# Patient Record
Sex: Male | Born: 1985 | Race: White | Hispanic: No | Marital: Single | Smoking: Never smoker
Health system: Southern US, Community
[De-identification: ages and names within clinical notes are randomized; demographics above are authoritative.]

## PROBLEM LIST (undated history)

## (undated) DIAGNOSIS — I499 Cardiac arrhythmia, unspecified: Secondary | ICD-10-CM

## (undated) DIAGNOSIS — F419 Anxiety disorder, unspecified: Secondary | ICD-10-CM

## (undated) DIAGNOSIS — K8020 Calculus of gallbladder without cholecystitis without obstruction: Secondary | ICD-10-CM

## (undated) DIAGNOSIS — F329 Major depressive disorder, single episode, unspecified: Secondary | ICD-10-CM

## (undated) DIAGNOSIS — Z789 Other specified health status: Secondary | ICD-10-CM

## (undated) DIAGNOSIS — K5900 Constipation, unspecified: Secondary | ICD-10-CM

## (undated) DIAGNOSIS — Z8709 Personal history of other diseases of the respiratory system: Secondary | ICD-10-CM

## (undated) DIAGNOSIS — F319 Bipolar disorder, unspecified: Secondary | ICD-10-CM

## (undated) DIAGNOSIS — K769 Liver disease, unspecified: Secondary | ICD-10-CM

## (undated) DIAGNOSIS — J982 Interstitial emphysema: Secondary | ICD-10-CM

## (undated) DIAGNOSIS — F410 Panic disorder [episodic paroxysmal anxiety] without agoraphobia: Secondary | ICD-10-CM

## (undated) DIAGNOSIS — F430 Acute stress reaction: Secondary | ICD-10-CM

## (undated) DIAGNOSIS — Q438 Other specified congenital malformations of intestine: Secondary | ICD-10-CM

## (undated) DIAGNOSIS — K219 Gastro-esophageal reflux disease without esophagitis: Secondary | ICD-10-CM

## (undated) DIAGNOSIS — F32A Depression, unspecified: Secondary | ICD-10-CM

## (undated) DIAGNOSIS — D649 Anemia, unspecified: Secondary | ICD-10-CM

## (undated) DIAGNOSIS — K5792 Diverticulitis of intestine, part unspecified, without perforation or abscess without bleeding: Secondary | ICD-10-CM

## (undated) HISTORY — DX: Diverticulitis of intestine, part unspecified, without perforation or abscess without bleeding: K57.92

## (undated) HISTORY — DX: Anxiety disorder, unspecified: F41.9

## (undated) HISTORY — PX: MOUTH SURGERY: SHX715

## (undated) HISTORY — PX: TONSILLECTOMY: SUR1361

## (undated) HISTORY — PX: CHOLECYSTECTOMY: SHX55

## (undated) HISTORY — PX: INCISE AND DRAIN ABCESS: PRO64

## (undated) HISTORY — DX: Major depressive disorder, single episode, unspecified: F32.9

## (undated) HISTORY — DX: Anemia, unspecified: D64.9

## (undated) HISTORY — DX: Calculus of gallbladder without cholecystitis without obstruction: K80.20

## (undated) HISTORY — DX: Depression, unspecified: F32.A

---
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
## 2009-07-07 HISTORY — PX: PR CHOLECYSTECTOMY: 47600

## 2013-05-20 ENCOUNTER — Encounter (HOSPITAL_COMMUNITY): Payer: Self-pay | Admitting: Emergency Medicine

## 2013-05-20 ENCOUNTER — Emergency Department (HOSPITAL_COMMUNITY)
Admission: EM | Admit: 2013-05-20 | Discharge: 2013-05-20 | Disposition: A | Discharge status: Home / Self Care | Payer: Self-pay | Attending: Emergency Medicine | Admitting: Emergency Medicine

## 2013-05-20 DIAGNOSIS — K002 Abnormalities of size and form of teeth: Secondary | ICD-10-CM | POA: Insufficient documentation

## 2013-05-20 DIAGNOSIS — H5710 Ocular pain, unspecified eye: Secondary | ICD-10-CM | POA: Insufficient documentation

## 2013-05-20 DIAGNOSIS — H9209 Otalgia, unspecified ear: Secondary | ICD-10-CM | POA: Insufficient documentation

## 2013-05-20 DIAGNOSIS — R079 Chest pain, unspecified: Secondary | ICD-10-CM | POA: Insufficient documentation

## 2013-05-20 DIAGNOSIS — K0889 Other specified disorders of teeth and supporting structures: Secondary | ICD-10-CM

## 2013-05-20 DIAGNOSIS — K089 Disorder of teeth and supporting structures, unspecified: Secondary | ICD-10-CM | POA: Insufficient documentation

## 2013-05-20 DIAGNOSIS — K029 Dental caries, unspecified: Secondary | ICD-10-CM | POA: Insufficient documentation

## 2013-05-20 DIAGNOSIS — R112 Nausea with vomiting, unspecified: Secondary | ICD-10-CM | POA: Insufficient documentation

## 2013-05-20 MED ORDER — ONDANSETRON 4 MG PO TBDP
4.0000 mg | ORAL_TABLET | Freq: Once | ORAL | Status: AC
  Administered 2013-05-20: 4 mg via ORAL
  Filled 2013-05-20: qty 1

## 2013-05-20 MED ORDER — PROMETHAZINE HCL 25 MG PO TABS: 25.0000 mg | ORAL_TABLET | Freq: Four times a day (QID) | ORAL | Status: DC | PRN

## 2013-05-20 MED ORDER — OXYCODONE-ACETAMINOPHEN 5-325 MG PO TABS
1.0000 | ORAL_TABLET | Freq: Once | ORAL | Status: AC
  Administered 2013-05-20: 1 via ORAL
  Filled 2013-05-20: qty 1

## 2013-05-20 MED ORDER — PENICILLIN V POTASSIUM 500 MG PO TABS
500.0000 mg | ORAL_TABLET | Freq: Once | ORAL | Status: AC
  Administered 2013-05-20: 500 mg via ORAL
  Filled 2013-05-20: qty 1

## 2013-05-20 MED ORDER — HYDROCODONE-ACETAMINOPHEN 5-325 MG PO TABS: 1.0000 | ORAL_TABLET | ORAL | Status: DC | PRN

## 2013-05-20 MED ORDER — PENICILLIN V POTASSIUM 500 MG PO TABS: 500.0000 mg | ORAL_TABLET | Freq: Three times a day (TID) | ORAL | Status: DC

## 2013-05-20 NOTE — ED Provider Notes (Signed)
CSN: [id number]     Arrival date & time 05/20/13  1245 History  This chart was scribed for Marlon Pel, PA-C, working with Audree Camel, MD, by [facility] ED Scribe. This patient was seen in room WTR6/WTR6 and the patient's care was started at 1:05 PM.    Chief Complaint  Patient presents with  . Jaw Pain  . Nausea  . Emesis    The history is provided by the patient. No language interpreter was used.    HPI Comments: Grayson Pfefferle is a 27 y.o. male who presents to the Emergency Department complaining of gradually worsening, constant, severe left-sided jaw pain over the past week. He describes this pain as "throbbing" and states that it radiates to his left ear and behind his left eye. He states that he has a long history of dental pain due to poor dentition. He reports associated nausea, emesis and chest pain onset 2 days ago. He denies fever. He states that he has no medication allergies.  History reviewed. No pertinent past medical history. Past Surgical History  Procedure Laterality Date  . Cholecystectomy     No family history on file. History  Substance Use Topics  . Smoking status: Never Smoker   . Smokeless tobacco: Never Used  . Alcohol Use: No    Review of Systems  HENT: Positive for dental problem (jaw pain) and ear pain (radiation of jaw pain to left ear).   Eyes: Positive for pain (radiation of jaw pain to behind left eye).  Cardiovascular: Positive for chest pain.  Gastrointestinal: Positive for nausea and vomiting.  All other systems reviewed and are negative.   Allergies  Review of patient's allergies indicates no known allergies.  Home Medications   Current Outpatient Rx  Name  Route  Sig  Dispense  Refill  . ibuprofen (ADVIL,MOTRIN) 200 MG tablet   Oral   Take 400-600 mg by mouth every 6 (six) hours as needed.         . naproxen sodium (ANAPROX) 220 MG tablet   Oral   Take 220-660 mg by mouth as needed (jaw pain).         Marland Kitchen  HYDROcodone-acetaminophen (NORCO/VICODIN) 5-325 MG per tablet   Oral   Take 1-2 tablets by mouth every 4 (four) hours as needed.   20 tablet   0   . penicillin v potassium (VEETID) 500 MG tablet   Oral   Take 1 tablet (500 mg total) by mouth 3 (three) times daily.   30 tablet   0   . promethazine (PHENERGAN) 25 MG tablet   Oral   Take 1 tablet (25 mg total) by mouth every 6 (six) hours as needed for nausea or vomiting.   30 tablet   0    There were no vitals taken for this visit.  Physical Exam  Nursing note and vitals reviewed. Constitutional: He is oriented to person, place, and time. He appears well-developed and well-nourished. No distress.  HENT:  Head: Normocephalic and atraumatic.  Mouth/Throat: Dental caries present.    Eyes: Conjunctivae and EOM are normal. Pupils are equal, round, and reactive to light.  Neck: Normal range of motion. Neck supple. No tracheal deviation present.  Cardiovascular: Normal rate and regular rhythm.   Pulmonary/Chest: Effort normal and breath sounds normal. No respiratory distress.  Musculoskeletal: Normal range of motion.  Neurological: He is alert and oriented to person, place, and time.  Skin: Skin is warm and dry.  Psychiatric: He has  a normal mood and affect. His behavior is normal.    ED Course  Procedures (including critical care time)  DIAGNOSTIC STUDIES:  COORDINATION OF CARE: 1:14 PM- DisPt advised of plan for treatment and pt agrees.  Labs Review Labs Reviewed - No data to display Imaging Review No results found.  EKG Interpretation   None       MDM   1. Toothache    Periodontal Exam   Patient has dental pain. No emergent s/sx's present. Patent airway. No trismus.  Will be given pain medication and antibiotics. I discussed the need to call dentist within 24/48 hours for follow-up. Dental referral given. Return to ED precautions given.  Pt voiced understanding and has agreed to follow-up.   27  y.o.Geraldine Amos's evaluation in the Emergency Department is complete. It has been determined that no acute conditions requiring further emergency intervention are present at this time. The patient/guardian have been advised of the diagnosis and plan. We have discussed signs and symptoms that warrant return to the ED, such as changes or worsening in symptoms.  Vital signs are stable at discharge. There were no vitals filed for this visit.  Patient/guardian has voiced understanding and agreed to follow-up with the PCP or specialist.  I personally performed the services described in this documentation, which was scribed in my presence. The recorded information has been reviewed and is accurate.    Dorthula Matas, PA-C 05/20/13 1315

## 2013-05-20 NOTE — ED Provider Notes (Signed)
Medical screening examination/treatment/procedure(s) were performed by non-physician practitioner and as supervising physician I was immediately available for consultation/collaboration.  EKG Interpretation   None         Audree Camel, MD 05/20/13 1712

## 2013-05-20 NOTE — ED Notes (Signed)
Pt c/o left lower jaw pain times 1 week and n/v times two days.

## 2013-06-13 ENCOUNTER — Encounter (HOSPITAL_COMMUNITY): Payer: Self-pay | Admitting: Emergency Medicine

## 2013-06-13 ENCOUNTER — Emergency Department (HOSPITAL_COMMUNITY): Payer: Self-pay

## 2013-06-13 ENCOUNTER — Emergency Department (HOSPITAL_COMMUNITY)
Admission: EM | Admit: 2013-06-13 | Discharge: 2013-06-13 | Disposition: A | Discharge status: Home / Self Care | Payer: Self-pay | Attending: Emergency Medicine | Admitting: Emergency Medicine

## 2013-06-13 DIAGNOSIS — Y929 Unspecified place or not applicable: Secondary | ICD-10-CM | POA: Insufficient documentation

## 2013-06-13 DIAGNOSIS — IMO0002 Reserved for concepts with insufficient information to code with codable children: Secondary | ICD-10-CM | POA: Insufficient documentation

## 2013-06-13 DIAGNOSIS — S29011A Strain of muscle and tendon of front wall of thorax, initial encounter: Secondary | ICD-10-CM

## 2013-06-13 DIAGNOSIS — R112 Nausea with vomiting, unspecified: Secondary | ICD-10-CM | POA: Insufficient documentation

## 2013-06-13 DIAGNOSIS — R5381 Other malaise: Secondary | ICD-10-CM | POA: Insufficient documentation

## 2013-06-13 DIAGNOSIS — Y939 Activity, unspecified: Secondary | ICD-10-CM | POA: Insufficient documentation

## 2013-06-13 DIAGNOSIS — Z9089 Acquired absence of other organs: Secondary | ICD-10-CM | POA: Insufficient documentation

## 2013-06-13 DIAGNOSIS — X58XXXA Exposure to other specified factors, initial encounter: Secondary | ICD-10-CM | POA: Insufficient documentation

## 2013-06-13 DIAGNOSIS — R1110 Vomiting, unspecified: Secondary | ICD-10-CM | POA: Diagnosis present

## 2013-06-13 DIAGNOSIS — K089 Disorder of teeth and supporting structures, unspecified: Secondary | ICD-10-CM | POA: Insufficient documentation

## 2013-06-13 DIAGNOSIS — K0889 Other specified disorders of teeth and supporting structures: Secondary | ICD-10-CM | POA: Diagnosis present

## 2013-06-13 LAB — PRO B NATRIURETIC PEPTIDE: Pro B Natriuretic peptide (BNP): 21.1 pg/mL (ref 0–125)

## 2013-06-13 LAB — CBC
HCT: 36 % — ABNORMAL LOW (ref 39.0–52.0)
MCH: 24.4 pg — ABNORMAL LOW (ref 26.0–34.0)
MCHC: 32.5 g/dL (ref 30.0–36.0)
Platelets: 309 10*3/uL (ref 150–400)
RBC: 4.79 MIL/uL (ref 4.22–5.81)
WBC: 8.8 10*3/uL (ref 4.0–10.5)

## 2013-06-13 LAB — BASIC METABOLIC PANEL
Calcium: 9 mg/dL (ref 8.4–10.5)
GFR calc non Af Amer: >90 mL/min (ref >90)
Potassium: 3.6 mEq/L (ref 3.5–5.1)
Sodium: 136 mEq/L (ref 135–145)

## 2013-06-13 LAB — POCT I-STAT TROPONIN I: Troponin i, poc: 0.08 ng/mL (ref 0.00–0.08)

## 2013-06-13 MED ORDER — HYDROMORPHONE HCL PF 1 MG/ML IJ SOLN
1.0000 mg | Freq: Once | INTRAMUSCULAR | Status: AC
Start: 2013-06-13 — End: 2013-06-13
  Administered 2013-06-13: 1 mg via INTRAVENOUS
  Filled 2013-06-13: qty 1

## 2013-06-13 MED ORDER — PROMETHAZINE HCL 25 MG PO TABS: 25.0000 mg | ORAL_TABLET | Freq: Four times a day (QID) | ORAL | Status: DC | PRN

## 2013-06-13 MED ORDER — ONDANSETRON 4 MG PO TBDP: ORAL_TABLET | ORAL | Status: DC

## 2013-06-13 MED ORDER — SODIUM CHLORIDE 0.9 % IV BOLUS (SEPSIS)
1000.0000 mL | INTRAVENOUS | Status: AC
  Administered 2013-06-13: 1000 mL via INTRAVENOUS

## 2013-06-13 MED ORDER — OXYCODONE-ACETAMINOPHEN 5-325 MG PO TABS: 1.0000 | ORAL_TABLET | Freq: Four times a day (QID) | ORAL | Status: DC | PRN

## 2013-06-13 MED ORDER — ONDANSETRON HCL 4 MG/2ML IJ SOLN
4.0000 mg | Freq: Once | INTRAMUSCULAR | Status: AC
  Administered 2013-06-13: 4 mg via INTRAVENOUS
  Filled 2013-06-13: qty 2

## 2013-06-13 MED ORDER — PENICILLIN V POTASSIUM 500 MG PO TABS: 500.0000 mg | ORAL_TABLET | Freq: Four times a day (QID) | ORAL | Status: AC

## 2013-06-13 NOTE — ED Notes (Signed)
Pt stating that chest pain has resolved but his teeth pain is still present.

## 2013-06-13 NOTE — ED Notes (Signed)
Pt. reports mid chest pain with SOB , productive cough and vomitting onset yesterday .

## 2013-06-13 NOTE — ED Provider Notes (Signed)
CSN: [id number]     Arrival date & time 06/13/13  1437 History   First MD Initiated Contact with Patient 06/13/13 1622     Chief Complaint  Patient presents with  . Chest Pain   (Consider location/radiation/quality/duration/timing/severity/associated sxs/prior Treatment) Patient is a 27 y.o. male presenting with chest pain. The history is provided by the patient.  Chest Pain Pain location:  R chest Pain quality: aching   Pain radiates to:  Does not radiate Pain radiates to the back: no   Pain severity:  Moderate Onset quality:  Gradual Duration:  2 days Timing:  Constant Progression:  Unchanged Chronicity:  New Context: at rest   Relieved by:  Nothing Worsened by:  Nothing tried Ineffective treatments:  None tried Associated symptoms: fatigue, nausea and vomiting   Associated symptoms: no abdominal pain, no cough, no fever, no headache, no numbness and no shortness of breath     History reviewed. No pertinent past medical history. Past Surgical History  Procedure Laterality Date  . Cholecystectomy     No family history on file. History  Substance Use Topics  . Smoking status: Never Smoker   . Smokeless tobacco: Never Used  . Alcohol Use: No    Review of Systems  Constitutional: Positive for fatigue. Negative for fever.  HENT: Negative for drooling and rhinorrhea.        Dental pain  Eyes: Negative for pain.  Respiratory: Negative for cough and shortness of breath.   Cardiovascular: Positive for chest pain. Negative for leg swelling.  Gastrointestinal: Positive for nausea and vomiting. Negative for abdominal pain and diarrhea.  Genitourinary: Negative for dysuria and hematuria.  Musculoskeletal: Negative for gait problem and neck pain.  Skin: Negative for color change.  Neurological: Negative for numbness and headaches.  Hematological: Negative for adenopathy.  Psychiatric/Behavioral: Negative for behavioral problems.  All other systems reviewed and are  negative.    Allergies  Review of patient's allergies indicates no known allergies.  Home Medications   Current Outpatient Rx  Name  Route  Sig  Dispense  Refill  . ibuprofen (ADVIL,MOTRIN) 200 MG tablet   Oral   Take 400-600 mg by mouth every 6 (six) hours as needed for mild pain.           BP 118/66  Pulse 70  Temp(Src) 97.8 F (36.6 C) (Oral)  Resp 19  Ht 5\' 7"  (1.702 m)  Wt 147 lb (66.679 kg)  BMI 23.02 kg/m2  SpO2 99% Physical Exam  Nursing note and vitals reviewed. Constitutional: He is oriented to person, place, and time. He appears well-developed and well-nourished.  HENT:  Head: Normocephalic and atraumatic.  Right Ear: External ear normal.  Left Ear: External ear normal.  Nose: Nose normal.  Mouth/Throat: Oropharynx is clear and moist. No oropharyngeal exudate.  Normal appearing posterior oropharynx. No periapical or oral abscess noted.  Poor dentition diffusely.  Tenderness to palpation of the right lower first and second molars.  Eyes: Conjunctivae and EOM are normal. Pupils are equal, round, and reactive to light.  Neck: Normal range of motion. Neck supple.  Cardiovascular: Normal rate, regular rhythm, normal heart sounds and intact distal pulses.  Exam reveals no gallop and no friction rub.   No murmur heard. Pulmonary/Chest: Effort normal and breath sounds normal. No respiratory distress. He has no wheezes. He exhibits no tenderness.  Abdominal: Soft. Bowel sounds are normal. He exhibits no distension. There is no tenderness. There is no rebound and no guarding.  Musculoskeletal:  Normal range of motion. He exhibits no edema and no tenderness.  Neurological: He is alert and oriented to person, place, and time.  Skin: Skin is warm and dry.  Psychiatric: He has a normal mood and affect. His behavior is normal.    ED Course  Procedures (including critical care time) Labs Review Labs Reviewed  CBC - Abnormal; Notable for the following:    Hemoglobin  11.7 (*)    HCT 36.0 (*)    MCV 75.2 (*)    MCH 24.4 (*)    All other components within normal limits  BASIC METABOLIC PANEL  PRO B NATRIURETIC PEPTIDE  POCT I-STAT TROPONIN I   Imaging Review Dg Chest 2 View  06/13/2013   CLINICAL DATA:  Chest pain  EXAM: CHEST  2 VIEW  COMPARISON:  None.  FINDINGS: The heart size and mediastinal contours are within normal limits. Both lungs are clear. The visualized skeletal structures are unremarkable.  IMPRESSION: No active cardiopulmonary disease.   Electronically Signed   By: Natasha Mead M.D.   On: 06/13/2013 15:53    EKG Interpretation    Date/Time:  Monday June 13 2013 14:41:06 EST Ventricular Rate:  78 PR Interval:  136 QRS Duration: 92 QT Interval:  354 QTC Calculation: 403 R Axis:   78 Text Interpretation:  Normal sinus rhythm with sinus arrhythmia Normal ECG Confirmed by Kaliyan Osbourn  MD, Mina Babula ([id number]) on 06/13/2013 5:07:02 PM            MDM   1. Pain, dental   2. Chest wall muscle strain, initial encounter   3. Vomiting    5:05 PM 27 y.o. male who presents with nausea and vomiting intermittently for approximately one week. He also notes constant right-sided aching chest pain which began 2 days ago. He saw some streaks of red in his emesis 1 day ago but does not know if it was blood or not. He denies any fevers, abdominal pain, or diarrhea. He is afebrile and vital signs are unremarkable here. Screening labwork is thus far noncontributory. Well's/Perc neg. Low risk for cardiac cause given age and lack of RF's. Doubt esophageal rupture given normal exam and CXR. Ecg non-contrib. Will get pain and nausea control.  6:17 PM: Pt continues to appear well. Likely chest wall strain from vomiting.  I have discussed the diagnosis/risks/treatment options with the patient and believe the pt to be eligible for discharge home to follow-up with GI non-emergently for cyclical vomiting and a dentist for his dental pain. We also discussed returning  to the ED immediately if new or worsening sx occur. We discussed the sx which are most concerning (e.g., worsening cp, fever, sob) that necessitate immediate return. Any new prescriptions provided to the patient are listed below.  Discharge Medication List as of 06/13/2013  6:20 PM    START taking these medications   Details  ondansetron (ZOFRAN ODT) 4 MG disintegrating tablet 4mg  ODT q4 hours prn nausea/vomit, Print    oxyCODONE-acetaminophen (PERCOCET) 5-325 MG per tablet Take 1 tablet by mouth every 6 (six) hours as needed., Starting 06/13/2013, Until Discontinued, Print    penicillin v potassium (VEETID) 500 MG tablet Take 1 tablet (500 mg total) by mouth 4 (four) times daily., Starting 06/13/2013, Last dose on Mon 06/20/13, Print         Junius Argyle, MD 06/14/13 [phone]

## 2013-06-13 NOTE — ED Notes (Addendum)
Pt requesting assistance with obtaining medications.  EDP made aware.

## 2013-06-17 NOTE — Care Management Note (Signed)
    Page 1 of 1   06/14/2013     7:38:14 AM   CARE MANAGEMENT NOTE 06/14/2013  Patient:  Levi Palmer, Levi Palmer   Account Number:  [id number]  Date Initiated:  06/13/2013  Documentation initiated by:  Roseanne Reno  Subjective/Objective Assessment:   Dental Pain, Chest Wall Strain and Vomiting.     Action/Plan:   Medication assistance for PCN, Zofran and pain med.   Anticipated DC Date:  06/13/2013   Anticipated DC Plan:  HOME/SELF CARE      DC Planning Services  Medication Assistance    Status of service:  Completed, signed off  Discharge Disposition:  HOME/SELF CARE  Comments:  06/14/13  735a - CM did not receive a call back from the ED or MD regarding medication assistance.  Pt was dc'd last pm w/ Rx's.  No further follow up needed.  TJohnson, RNBSN   06/14/13  730a  Late Entry - On Call -  06/13/13 715p- Received call from [facility], spoke w/ Dr. Romeo Apple who stated that this pt would likely need medication assistance for PCN, Zofran and pain medication.  CM explained to MD that we could not assist w/ narcotic pain medication.  CM also suggested using another medication other than Zofran if possible due to cost.  PCN is on the $4 list at [facility] as is Phenergan - MD will look at that substitute. Explained that the medication assistance program is a one time per year assist.  MD will speak w/ pt about the $4 medication list at [facility] and then call CM back if they want to proceed w/ the medication assistance.  TJohnson, RNBSN [phone]

## 2013-06-25 ENCOUNTER — Emergency Department (HOSPITAL_COMMUNITY)
Admission: EM | Admit: 2013-06-25 | Discharge: 2013-06-25 | Disposition: A | Discharge status: Home / Self Care | Payer: Self-pay | Attending: Emergency Medicine | Admitting: Emergency Medicine

## 2013-06-25 ENCOUNTER — Emergency Department (HOSPITAL_COMMUNITY): Payer: Self-pay

## 2013-06-25 ENCOUNTER — Encounter (HOSPITAL_COMMUNITY): Payer: Self-pay | Admitting: Emergency Medicine

## 2013-06-25 DIAGNOSIS — K219 Gastro-esophageal reflux disease without esophagitis: Secondary | ICD-10-CM | POA: Insufficient documentation

## 2013-06-25 DIAGNOSIS — H6120 Impacted cerumen, unspecified ear: Secondary | ICD-10-CM | POA: Insufficient documentation

## 2013-06-25 DIAGNOSIS — Z79899 Other long term (current) drug therapy: Secondary | ICD-10-CM | POA: Insufficient documentation

## 2013-06-25 DIAGNOSIS — R0789 Other chest pain: Secondary | ICD-10-CM | POA: Insufficient documentation

## 2013-06-25 DIAGNOSIS — R51 Headache: Secondary | ICD-10-CM | POA: Insufficient documentation

## 2013-06-25 DIAGNOSIS — R1084 Generalized abdominal pain: Secondary | ICD-10-CM | POA: Insufficient documentation

## 2013-06-25 LAB — COMPREHENSIVE METABOLIC PANEL
ALT: 8 U/L (ref 0–53)
Albumin: 4.2 g/dL (ref 3.5–5.2)
Alkaline Phosphatase: 64 U/L (ref 39–117)
BUN: 22 mg/dL (ref 6–23)
Calcium: 9.3 mg/dL (ref 8.4–10.5)
Creatinine, Ser: 0.91 mg/dL (ref 0.50–1.35)
Glucose, Bld: 81 mg/dL (ref 70–99)
Potassium: 3.8 mEq/L (ref 3.5–5.1)
Sodium: 135 mEq/L (ref 135–145)
Total Protein: 7.4 g/dL (ref 6.0–8.3)

## 2013-06-25 LAB — CBC WITH DIFFERENTIAL/PLATELET
Basophils Absolute: 0 10*3/uL (ref 0.0–0.1)
Basophils Relative: 0 % (ref 0–1)
Eosinophils Absolute: 0.2 10*3/uL (ref 0.0–0.7)
Hemoglobin: 11.3 g/dL — ABNORMAL LOW (ref 13.0–17.0)
Lymphs Abs: 1.9 10*3/uL (ref 0.7–4.0)
MCH: 24.1 pg — ABNORMAL LOW (ref 26.0–34.0)
MCHC: 33 g/dL (ref 30.0–36.0)
MCV: 72.9 fL — ABNORMAL LOW (ref 78.0–100.0)
Monocytes Absolute: 1 10*3/uL (ref 0.1–1.0)
Neutrophils Relative %: 75 % (ref 43–77)
Platelets: 267 10*3/uL (ref 150–400)
RDW: 15.8 % — ABNORMAL HIGH (ref 11.5–15.5)
WBC: 12.3 10*3/uL — ABNORMAL HIGH (ref 4.0–10.5)

## 2013-06-25 LAB — RAPID URINE DRUG SCREEN, HOSP PERFORMED
Barbiturates: POSITIVE — AB
Benzodiazepines: NOT DETECTED
Opiates: NOT DETECTED
Tetrahydrocannabinol: POSITIVE — AB

## 2013-06-25 LAB — TROPONIN I: Troponin I: <0.3 ng/mL (ref <0.30)

## 2013-06-25 MED ORDER — OMEPRAZOLE 20 MG PO CPDR: 20.0000 mg | DELAYED_RELEASE_CAPSULE | Freq: Every day | ORAL | Status: DC

## 2013-06-25 MED ORDER — ONDANSETRON HCL 4 MG PO TABS: 4.0000 mg | ORAL_TABLET | Freq: Four times a day (QID) | ORAL | Status: DC

## 2013-06-25 MED ORDER — GI COCKTAIL ~~LOC~~
30.0000 mL | Freq: Once | ORAL | Status: AC
  Administered 2013-06-25: 30 mL via ORAL
  Filled 2013-06-25: qty 30

## 2013-06-25 MED ORDER — SODIUM CHLORIDE 0.9 % IV BOLUS (SEPSIS)
1000.0000 mL | Freq: Once | INTRAVENOUS | Status: AC
  Administered 2013-06-25: 1000 mL via INTRAVENOUS

## 2013-06-25 MED ORDER — FAMOTIDINE IN NACL 20-0.9 MG/50ML-% IV SOLN
20.0000 mg | Freq: Once | INTRAVENOUS | Status: AC
  Administered 2013-06-25: 20 mg via INTRAVENOUS
  Filled 2013-06-25: qty 50

## 2013-06-25 MED ORDER — PROMETHAZINE HCL 25 MG PO TABS: 25.0000 mg | ORAL_TABLET | Freq: Four times a day (QID) | ORAL | Status: DC | PRN

## 2013-06-25 MED ORDER — METOCLOPRAMIDE HCL 5 MG/ML IJ SOLN
10.0000 mg | Freq: Once | INTRAMUSCULAR | Status: AC
  Administered 2013-06-25: 10 mg via INTRAVENOUS
  Filled 2013-06-25: qty 2

## 2013-06-25 MED ORDER — PROMETHAZINE HCL 25 MG RE SUPP: 25.0000 mg | Freq: Four times a day (QID) | RECTAL | Status: DC | PRN

## 2013-06-25 MED ORDER — HYDROMORPHONE HCL PF 1 MG/ML IJ SOLN: 0.5000 mg | Freq: Once | INTRAMUSCULAR | Status: DC

## 2013-06-25 NOTE — ED Notes (Signed)
Discharge instructions explained to patient; pt verbalized understanding instructions; prescriptions also given to patient.

## 2013-06-25 NOTE — ED Provider Notes (Signed)
CSN: [id number]     Arrival date & time 06/25/13  1030 History   First MD Initiated Contact with Patient 06/25/13 1058     Chief Complaint  Patient presents with  . Nausea  . Emesis  . Chest Pain   (Consider location/radiation/quality/duration/timing/severity/associated sxs/prior Treatment) The history is provided by the patient. No language interpreter was used.  Levi Palmer is a 27 y/o M with no significant PMHx presenting to the ED with nausea, vomiting, and chest pain. Patient reported that the symptoms started approximately 2 days ago. Reported that he has been having lingering chest pain. Patient reported that the chest pain is like a "heartburn" sensation - stated that it is localize to the center of his chest with radiation to the lower ribs bilaterally - reported a pressure sensation. Patient reported that nothing makes the discomfort better. Patient reported that he has been having emesis and nausea for the past 2 days, reported that he had 2 episodes of emesis today - reported red streaks in his emesis today, reported bright red, small. Denied coffee ground emesis. Reported soreness to the abdomen secondary to emesis. Denied diarrhea, fever, chills, sweating, dizziness, neck pain, neck stiffness, weakness, nasal congestion, cough, back pain, urinary symptoms, changes to bowel movements. PCP none   History reviewed. No pertinent past medical history. Past Surgical History  Procedure Laterality Date  . Cholecystectomy    . Tonsillectomy     No family history on file. History  Substance Use Topics  . Smoking status: Never Smoker   . Smokeless tobacco: Never Used  . Alcohol Use: No    Review of Systems  Constitutional: Negative for fever and chills.  Eyes: Negative for visual disturbance.  Respiratory: Negative for cough, chest tightness and shortness of breath.   Cardiovascular: Positive for chest pain.  Gastrointestinal: Positive for nausea, vomiting and  abdominal pain. Negative for diarrhea, constipation, blood in stool and anal bleeding.  Genitourinary: Negative for dysuria and decreased urine volume.  Musculoskeletal: Negative for back pain, myalgias and neck pain.  Neurological: Positive for headaches. Negative for dizziness and weakness.  All other systems reviewed and are negative.    Allergies  Review of patient's allergies indicates no known allergies.  Home Medications   Current Outpatient Rx  Name  Route  Sig  Dispense  Refill  . naproxen sodium (ANAPROX) 220 MG tablet   Oral   Take 440 mg by mouth 2 (two) times daily as needed (pain).         Marland Kitchen oxyCODONE-acetaminophen (PERCOCET) 5-325 MG per tablet   Oral   Take 1 tablet by mouth every 6 (six) hours as needed.   15 tablet   0   . omeprazole (PRILOSEC) 20 MG capsule   Oral   Take 1 capsule (20 mg total) by mouth daily.   5 capsule   0   . ondansetron (ZOFRAN) 4 MG tablet   Oral   Take 1 tablet (4 mg total) by mouth every 6 (six) hours.   12 tablet   0   . promethazine (PHENERGAN) 25 MG tablet   Oral   Take 1 tablet (25 mg total) by mouth every 6 (six) hours as needed for nausea or vomiting.   15 tablet   0    BP 119/72  Pulse 65  Temp(Src) 98.3 F (36.8 C) (Oral)  Resp 16  SpO2 98% Physical Exam  Nursing note and vitals reviewed. Constitutional: He appears well-developed and well-nourished. No distress.  HENT:  Head: Normocephalic and atraumatic.  Right Ear: External ear normal.  Left Ear: External ear normal.  Mouth/Throat: Oropharynx is clear and moist. No oropharyngeal exudate.  Cerumen impaction noted in left ear  Eyes: Conjunctivae and EOM are normal. Pupils are equal, round, and reactive to light. Right eye exhibits no discharge. Left eye exhibits no discharge.  Neck: Normal range of motion. Neck supple.  Negative neck stiffness Negative nuchal rigidity Negative cervical LAD Negative meningeal signs  Cardiovascular: Normal rate,  regular rhythm and normal heart sounds.  Exam reveals no friction rub.   No murmur heard. Pulmonary/Chest: Effort normal and breath sounds normal. No respiratory distress. He has no wheezes. He has no rales. He exhibits no tenderness.  Negative respiratory distress Negative use of accessory muscles Patient able to speak in full sentences without difficulty Negative pain upon palpation to the chest wall  Abdominal: Soft. Normal appearance and bowel sounds are normal. There is generalized tenderness. There is no guarding and no CVA tenderness.  Diffuse tenderness upon palpation - soreness  Musculoskeletal: Normal range of motion.  Full ROM to upper and lower extremities without difficulty noted, negative ataxia noted  Lymphadenopathy:    He has no cervical adenopathy.  Neurological: He is alert. He exhibits normal muscle tone. Coordination normal.  Strength intact to upper and lower extremities with resistance applied, equal distribution noted  Skin: Skin is warm and dry. No rash noted. He is not diaphoretic. No erythema.  Psychiatric: He has a normal mood and affect. His behavior is normal. Thought content normal.    ED Course  Procedures (including critical care time)  2:59 PM This provider discussed labs and imaging results with patient in great length. This provider re-assessed patient, patient reported that he continues to feel heartburn. This provider prescribed pepcid.   4:27 PM Patient requesting phenergan instead of Zofran because phenergan is on the $4 menu.   Results for orders placed during the hospital encounter of 06/25/13  CBC WITH DIFFERENTIAL      Result Value Range   WBC 12.3 (*) 4.0 - 10.5 K/uL   RBC 4.69  4.22 - 5.81 MIL/uL   Hemoglobin 11.3 (*) 13.0 - 17.0 g/dL   HCT 95.2 (*) 84.1 - 32.4 %   MCV 72.9 (*) 78.0 - 100.0 fL   MCH 24.1 (*) 26.0 - 34.0 pg   MCHC 33.0  30.0 - 36.0 g/dL   RDW 40.1 (*) 02.7 - 25.3 %   Platelets 267  150 - 400 K/uL   Neutrophils  Relative % 75  43 - 77 %   Neutro Abs 9.3 (*) 1.7 - 7.7 K/uL   Lymphocytes Relative 15  12 - 46 %   Lymphs Abs 1.9  0.7 - 4.0 K/uL   Monocytes Relative 8  3 - 12 %   Monocytes Absolute 1.0  0.1 - 1.0 K/uL   Eosinophils Relative 2  0 - 5 %   Eosinophils Absolute 0.2  0.0 - 0.7 K/uL   Basophils Relative 0  0 - 1 %   Basophils Absolute 0.0  0.0 - 0.1 K/uL  COMPREHENSIVE METABOLIC PANEL      Result Value Range   Sodium 135  135 - 145 mEq/L   Potassium 3.8  3.5 - 5.1 mEq/L   Chloride 101  96 - 112 mEq/L   CO2 26  19 - 32 mEq/L   Glucose, Bld 81  70 - 99 mg/dL   BUN 22  6 - 23  mg/dL   Creatinine, Ser 9.60  0.50 - 1.35 mg/dL   Calcium 9.3  8.4 - 45.4 mg/dL   Total Protein 7.4  6.0 - 8.3 g/dL   Albumin 4.2  3.5 - 5.2 g/dL   AST 12  0 - 37 U/L   ALT 8  0 - 53 U/L   Alkaline Phosphatase 64  39 - 117 U/L   Total Bilirubin 0.2 (*) 0.3 - 1.2 mg/dL   GFR calc non Af Amer >90  >90 mL/min   GFR calc Af Amer >90  >90 mL/min  TROPONIN I      Result Value Range   Troponin I <0.30  <0.30 ng/mL  URINE RAPID DRUG SCREEN (HOSP PERFORMED)      Result Value Range   Opiates NONE DETECTED  NONE DETECTED   Cocaine NONE DETECTED  NONE DETECTED   Benzodiazepines NONE DETECTED  NONE DETECTED   Amphetamines NONE DETECTED  NONE DETECTED   Tetrahydrocannabinol POSITIVE (*) NONE DETECTED   Barbiturates POSITIVE (*) NONE DETECTED   Dg Chest 2 View  06/25/2013   CLINICAL DATA:  Nausea.  Vomiting.  Headache.  Chest pain.  EXAM: CHEST  2 VIEW  COMPARISON:  None.  FINDINGS: Cardiopericardial silhouette within normal limits. Mediastinal contours normal. Trachea midline. No airspace disease or effusion. Monitoring leads project over the chest.  IMPRESSION: No active cardiopulmonary disease.   Electronically Signed   By: Andreas Newport M.D.   On: 06/25/2013 11:24   Dg Chest 2 View  06/13/2013   CLINICAL DATA:  Chest pain  EXAM: CHEST  2 VIEW  COMPARISON:  None.  FINDINGS: The heart size and mediastinal contours  are within normal limits. Both lungs are clear. The visualized skeletal structures are unremarkable.  IMPRESSION: No active cardiopulmonary disease.   Electronically Signed   By: Natasha Mead M.D.   On: 06/13/2013 15:53   Labs Review Labs Reviewed  CBC WITH DIFFERENTIAL - Abnormal; Notable for the following:    WBC 12.3 (*)    Hemoglobin 11.3 (*)    HCT 34.2 (*)    MCV 72.9 (*)    MCH 24.1 (*)    RDW 15.8 (*)    Neutro Abs 9.3 (*)    All other components within normal limits  COMPREHENSIVE METABOLIC PANEL - Abnormal; Notable for the following:    Total Bilirubin 0.2 (*)    All other components within normal limits  URINE RAPID DRUG SCREEN (HOSP PERFORMED) - Abnormal; Notable for the following:    Tetrahydrocannabinol POSITIVE (*)    Barbiturates POSITIVE (*)    All other components within normal limits  TROPONIN I   Imaging Review Dg Chest 2 View  06/25/2013   CLINICAL DATA:  Nausea.  Vomiting.  Headache.  Chest pain.  EXAM: CHEST  2 VIEW  COMPARISON:  None.  FINDINGS: Cardiopericardial silhouette within normal limits. Mediastinal contours normal. Trachea midline. No airspace disease or effusion. Monitoring leads project over the chest.  IMPRESSION: No active cardiopulmonary disease.   Electronically Signed   By: Andreas Newport M.D.   On: 06/25/2013 11:24    EKG Interpretation   None       Date: 06/25/2013  Rate: 81   Rhythm: normal sinus rhythm  QRS Axis: normal  Intervals: normal  ST/T Wave abnormalities: normal  Conduction Disutrbances:none  Narrative Interpretation:   Old EKG Reviewed: unchanged EKG analyzed and reviewed by this provider and attending physician.      MDM  1. GERD (gastroesophageal reflux disease)     Medications  gi cocktail (Maalox,Lidocaine,Donnatal) (30 mLs Oral Given 06/25/13 1151)  sodium chloride 0.9 % bolus 1,000 mL (0 mLs Intravenous Stopped 06/25/13 1516)  metoCLOPramide (REGLAN) injection 10 mg (10 mg Intravenous Given 06/25/13  1225)  sodium chloride 0.9 % bolus 1,000 mL (0 mLs Intravenous Stopped 06/25/13 1516)  famotidine (PEPCID) IVPB 20 mg (0 mg Intravenous Stopped 06/25/13 1547)    Filed Vitals:   06/25/13 1107 06/25/13 1553  BP: 123/67 119/72  Pulse: 82 65  Temp: 98.7 F (37.1 C) 98.3 F (36.8 C)  TempSrc: Oral Oral  Resp: 16 16  SpO2: 98% 98%    Patient presenting to the ED with chest pain, nausea and emesis that has been ongoing for the past 2 days.  This provider reviewed the patient's chart. Patient was seen in the ED on 06/13/2013 for same symptoms, chest xray was negative. EKG and labs were unremarkable. Patient was referred to cardiology for further evaluation and monitoring, patient reported that he has not followed up.  Alert and oriented. GCS 15. Heart rate and rhythm normal. Pulses palpable and strong, radial and DP 2+ bilaterally. Lungs clear to auscultation to upper and lower lobes. BS normoactive in all 4 quadrants - diffuse tenderness upon palpation - reported that he has been having soreness in the stomach. Negative acute abdomen, negative peritoneal signs. Non-surgical abdomen noted. Full ROM to upper and lower extremities bilaterally. Strength intact and equal.  Chest xray negative for acute cardiopulmonary disease. CBC mild elevated WBC with negative left shift. CMP negative findings. Troponin negative elevation. Urine drug screen noted cannabis and barbituates.  IV fluids administered with GI cocktail. IV pepcid administered.  PERC/Well's 0. Doubt cardiac issue based on patient's age and risk factors. Patient stable, afebrile. Patient stable while in ED setting. Negative episodes of emesis while in ED setting. Negative hemoptysis - negative tachycardia, negative tachypnea. Blood pressure normal. Doubt PE. Suspicion to be GERD. Discharged patient. Referred to Cardiology and GI. Discussed with patient diet. Discussed with patient to rest and stay hydrated. Discussed with patient to closely  monitor symptoms and if symptoms are to worsen or change to report back to the ED - strict return instructions given.  Patient agreed to plan of care, understood, all questions answered.   Raymon Mutton, PA-C 06/25/13 1715

## 2013-06-25 NOTE — ED Notes (Signed)
Pt from home reports N,V, CP x2 days. Pt has been dx with chronic CP and was advised to see a specialist but has not made appt. Pt denies SOB. Pt is A&O and in NAD

## 2013-06-28 NOTE — ED Provider Notes (Signed)
Medical screening examination/treatment/procedure(s) were performed by non-physician practitioner and as supervising physician I was immediately available for consultation/collaboration.    Skarlett Sedlacek R Octavio Matheney, MD 06/28/13 1848 

## 2013-07-07 HISTORY — PX: EXPLORATORY LAPAROTOMY: AMBSHX0061

## 2013-07-26 ENCOUNTER — Emergency Department (HOSPITAL_COMMUNITY): Payer: Self-pay

## 2013-07-26 ENCOUNTER — Inpatient Hospital Stay (HOSPITAL_COMMUNITY)
Admission: EM | Admit: 2013-07-26 | Discharge: 2013-08-02 | DRG: 199 | Disposition: A | Discharge status: Home / Self Care | Payer: Self-pay | Attending: Family Medicine | Admitting: Family Medicine

## 2013-07-26 ENCOUNTER — Encounter (HOSPITAL_COMMUNITY): Payer: Self-pay | Admitting: Emergency Medicine

## 2013-07-26 DIAGNOSIS — J982 Interstitial emphysema: Principal | ICD-10-CM

## 2013-07-26 DIAGNOSIS — R1011 Right upper quadrant pain: Secondary | ICD-10-CM | POA: Diagnosis present

## 2013-07-26 DIAGNOSIS — F329 Major depressive disorder, single episode, unspecified: Secondary | ICD-10-CM

## 2013-07-26 DIAGNOSIS — I472 Ventricular tachycardia, unspecified: Secondary | ICD-10-CM | POA: Diagnosis present

## 2013-07-26 DIAGNOSIS — S29011A Strain of muscle and tendon of front wall of thorax, initial encounter: Secondary | ICD-10-CM

## 2013-07-26 DIAGNOSIS — K529 Noninfective gastroenteritis and colitis, unspecified: Secondary | ICD-10-CM

## 2013-07-26 DIAGNOSIS — F1210 Cannabis abuse, uncomplicated: Secondary | ICD-10-CM | POA: Diagnosis present

## 2013-07-26 DIAGNOSIS — I4729 Other ventricular tachycardia: Secondary | ICD-10-CM

## 2013-07-26 DIAGNOSIS — R112 Nausea with vomiting, unspecified: Secondary | ICD-10-CM

## 2013-07-26 DIAGNOSIS — N179 Acute kidney failure, unspecified: Secondary | ICD-10-CM

## 2013-07-26 DIAGNOSIS — K223 Perforation of esophagus: Secondary | ICD-10-CM

## 2013-07-26 DIAGNOSIS — D72829 Elevated white blood cell count, unspecified: Secondary | ICD-10-CM | POA: Diagnosis present

## 2013-07-26 DIAGNOSIS — R109 Unspecified abdominal pain: Secondary | ICD-10-CM

## 2013-07-26 DIAGNOSIS — R1110 Vomiting, unspecified: Secondary | ICD-10-CM

## 2013-07-26 DIAGNOSIS — F32A Depression, unspecified: Secondary | ICD-10-CM

## 2013-07-26 DIAGNOSIS — Z9089 Acquired absence of other organs: Secondary | ICD-10-CM

## 2013-07-26 DIAGNOSIS — E860 Dehydration: Secondary | ICD-10-CM | POA: Diagnosis present

## 2013-07-26 DIAGNOSIS — IMO0002 Reserved for concepts with insufficient information to code with codable children: Secondary | ICD-10-CM

## 2013-07-26 DIAGNOSIS — R634 Abnormal weight loss: Secondary | ICD-10-CM | POA: Diagnosis present

## 2013-07-26 DIAGNOSIS — T43205A Adverse effect of unspecified antidepressants, initial encounter: Secondary | ICD-10-CM | POA: Diagnosis present

## 2013-07-26 DIAGNOSIS — R079 Chest pain, unspecified: Secondary | ICD-10-CM | POA: Diagnosis present

## 2013-07-26 DIAGNOSIS — K0889 Other specified disorders of teeth and supporting structures: Secondary | ICD-10-CM

## 2013-07-26 DIAGNOSIS — K5289 Other specified noninfective gastroenteritis and colitis: Secondary | ICD-10-CM | POA: Diagnosis present

## 2013-07-26 DIAGNOSIS — F419 Anxiety disorder, unspecified: Secondary | ICD-10-CM

## 2013-07-26 DIAGNOSIS — F411 Generalized anxiety disorder: Secondary | ICD-10-CM | POA: Diagnosis present

## 2013-07-26 HISTORY — DX: Other specified health status: Z78.9

## 2013-07-26 LAB — URINALYSIS, ROUTINE W REFLEX MICROSCOPIC
GLUCOSE, UA: NEGATIVE mg/dL
Ketones, ur: >80 mg/dL — AB
Leukocytes, UA: NEGATIVE
Nitrite: NEGATIVE
Protein, ur: 30 mg/dL — AB
UROBILINOGEN UA: 0.2 mg/dL (ref 0.0–1.0)
pH: 6.5 (ref 5.0–8.0)

## 2013-07-26 LAB — CBC WITH DIFFERENTIAL/PLATELET
BASOS ABS: 0 10*3/uL (ref 0.0–0.1)
Basophils Relative: 0 % (ref 0–1)
EOS PCT: 0 % (ref 0–5)
Eosinophils Absolute: 0 10*3/uL (ref 0.0–0.7)
HEMATOCRIT: 42.1 % (ref 39.0–52.0)
Hemoglobin: 14.4 g/dL (ref 13.0–17.0)
Lymphocytes Relative: 7 % — ABNORMAL LOW (ref 12–46)
Lymphs Abs: 1.5 10*3/uL (ref 0.7–4.0)
MCH: 24 pg — ABNORMAL LOW (ref 26.0–34.0)
MCHC: 34.2 g/dL (ref 30.0–36.0)
MCV: 70.2 fL — AB (ref 78.0–100.0)
MONO ABS: 0.7 10*3/uL (ref 0.1–1.0)
Monocytes Relative: 3 % (ref 3–12)
Neutro Abs: 19 10*3/uL — ABNORMAL HIGH (ref 1.7–7.7)
Neutrophils Relative %: 90 % — ABNORMAL HIGH (ref 43–77)
PLATELETS: 417 10*3/uL — AB (ref 150–400)
RBC: 6 MIL/uL — ABNORMAL HIGH (ref 4.22–5.81)
RDW: 15.6 % — AB (ref 11.5–15.5)
WBC: 21.2 10*3/uL — ABNORMAL HIGH (ref 4.0–10.5)

## 2013-07-26 LAB — COMPREHENSIVE METABOLIC PANEL
ALBUMIN: 5.4 g/dL — AB (ref 3.5–5.2)
ALT: 12 U/L (ref 0–53)
AST: 19 U/L (ref 0–37)
Alkaline Phosphatase: 100 U/L (ref 39–117)
BUN: 16 mg/dL (ref 6–23)
CALCIUM: 11.4 mg/dL — AB (ref 8.4–10.5)
CO2: 15 mEq/L — ABNORMAL LOW (ref 19–32)
CREATININE: 1.66 mg/dL — AB (ref 0.50–1.35)
Chloride: 97 mEq/L (ref 96–112)
GFR calc Af Amer: 64 mL/min — ABNORMAL LOW (ref >90)
GFR calc non Af Amer: 56 mL/min — ABNORMAL LOW (ref >90)
Glucose, Bld: 141 mg/dL — ABNORMAL HIGH (ref 70–99)
Potassium: 4.5 mEq/L (ref 3.7–5.3)
SODIUM: 140 meq/L (ref 137–147)
Total Bilirubin: 0.4 mg/dL (ref 0.3–1.2)
Total Protein: 10 g/dL — ABNORMAL HIGH (ref 6.0–8.3)

## 2013-07-26 LAB — URINE MICROSCOPIC-ADD ON

## 2013-07-26 LAB — LIPASE, BLOOD: Lipase: 13 U/L (ref 11–59)

## 2013-07-26 MED ORDER — HYDROMORPHONE HCL PF 1 MG/ML IJ SOLN
1.0000 mg | Freq: Once | INTRAMUSCULAR | Status: AC
  Administered 2013-07-26: 1 mg via INTRAVENOUS
  Filled 2013-07-26: qty 1

## 2013-07-26 MED ORDER — SODIUM CHLORIDE 0.9 % IJ SOLN
3.0000 mL | Freq: Two times a day (BID) | INTRAMUSCULAR | Status: DC
  Administered 2013-07-28 – 2013-08-01 (×8): 3 mL via INTRAVENOUS

## 2013-07-26 MED ORDER — PIPERACILLIN-TAZOBACTAM 3.375 G IVPB
3.3750 g | Freq: Three times a day (TID) | INTRAVENOUS | Status: DC
  Administered 2013-07-26 – 2013-08-02 (×21): 3.375 g via INTRAVENOUS
  Filled 2013-07-26 (×23): qty 50

## 2013-07-26 MED ORDER — ONDANSETRON HCL 4 MG/2ML IJ SOLN
4.0000 mg | Freq: Four times a day (QID) | INTRAMUSCULAR | Status: DC | PRN
  Administered 2013-07-27 – 2013-07-30 (×7): 4 mg via INTRAVENOUS
  Filled 2013-07-26 (×8): qty 2

## 2013-07-26 MED ORDER — ONDANSETRON HCL 4 MG/2ML IJ SOLN
4.0000 mg | Freq: Once | INTRAMUSCULAR | Status: AC
  Administered 2013-07-26: 4 mg via INTRAVENOUS
  Filled 2013-07-26: qty 2

## 2013-07-26 MED ORDER — SODIUM CHLORIDE 0.9 % IV SOLN
INTRAVENOUS | Status: DC
  Administered 2013-07-26: 125 mL/h via INTRAVENOUS
  Administered 2013-07-27: 09:00:00 via INTRAVENOUS

## 2013-07-26 MED ORDER — PANTOPRAZOLE SODIUM 40 MG IV SOLR
40.0000 mg | Freq: Two times a day (BID) | INTRAVENOUS | Status: DC
  Administered 2013-07-26 – 2013-08-02 (×14): 40 mg via INTRAVENOUS
  Filled 2013-07-26 (×15): qty 40

## 2013-07-26 MED ORDER — HEPARIN SODIUM (PORCINE) 5000 UNIT/ML IJ SOLN
5000.0000 [IU] | Freq: Three times a day (TID) | INTRAMUSCULAR | Status: DC
  Administered 2013-07-26 – 2013-08-02 (×18): 5000 [IU] via SUBCUTANEOUS
  Filled 2013-07-26 (×23): qty 1

## 2013-07-26 MED ORDER — IOHEXOL 300 MG/ML  SOLN
100.0000 mL | Freq: Once | INTRAMUSCULAR | Status: AC | PRN
  Administered 2013-07-26: 100 mL via INTRAVENOUS

## 2013-07-26 MED ORDER — GI COCKTAIL ~~LOC~~: 30.0000 mL | Freq: Once | ORAL | Status: DC

## 2013-07-26 MED ORDER — SODIUM CHLORIDE 0.9 % IV BOLUS (SEPSIS)
1000.0000 mL | INTRAVENOUS | Status: AC
  Administered 2013-07-26: 1000 mL via INTRAVENOUS

## 2013-07-26 MED ORDER — HYDROMORPHONE HCL PF 1 MG/ML IJ SOLN
0.5000 mg | INTRAMUSCULAR | Status: DC | PRN
  Administered 2013-07-26 – 2013-07-27 (×2): 0.5 mg via INTRAVENOUS
  Filled 2013-07-26 (×3): qty 1

## 2013-07-26 MED ORDER — FAMOTIDINE IN NACL 20-0.9 MG/50ML-% IV SOLN
20.0000 mg | Freq: Once | INTRAVENOUS | Status: AC
  Administered 2013-07-26: 20 mg via INTRAVENOUS
  Filled 2013-07-26: qty 50

## 2013-07-26 MED ORDER — HYDROMORPHONE HCL PF 1 MG/ML IJ SOLN
INTRAMUSCULAR | Status: AC
  Administered 2013-07-26: 1 mg
  Filled 2013-07-26: qty 1

## 2013-07-26 MED ORDER — ONDANSETRON HCL 4 MG PO TABS: 4.0000 mg | ORAL_TABLET | Freq: Four times a day (QID) | ORAL | Status: DC | PRN

## 2013-07-26 MED ORDER — PROMETHAZINE HCL 25 MG/ML IJ SOLN
25.0000 mg | Freq: Once | INTRAMUSCULAR | Status: AC
  Administered 2013-07-26: 25 mg via INTRAVENOUS
  Filled 2013-07-26: qty 1

## 2013-07-26 NOTE — ED Provider Notes (Signed)
Medical screening examination/treatment/procedure(s) were conducted as a shared visit with non-physician practitioner(s) and myself.  I personally evaluated the patient during the encounter.  EKG Interpretation    Date/Time:    Ventricular Rate:    PR Interval:    QRS Duration:   QT Interval:    QTC Calculation:   R Axis:     Text Interpretation:               Patient here with vomiting, abdominal pain for past few days. Patient here has marked right lower quadrant pain on exam. Patient's CT of his abdomen was normal except for mediastinal air. I spoke with patient about this, he stated he has had this before and spent a week in the hospital without any etiology being found. I cannot find his records, he [facility], [facility] or [facility] [place]. Patient has no subcutaneous emphysema in his chest. CT of his chest shows mediastinal air without esophageal perforation. Surgery consult and will see patient. Ask GI to be consult. GI will see patient in the morning. Patient's admitted by medicine  Dagmar HaitWilliam Timofey Carandang, MD 07/26/13 [phone]

## 2013-07-26 NOTE — ED Provider Notes (Signed)
CSN: [id number]     Arrival date & time 07/26/13  1444 History   First MD Initiated Contact with Patient 07/26/13 1608     Chief Complaint  Patient presents with  . Emesis   (Consider location/radiation/quality/duration/timing/severity/associated sxs/prior Treatment) HPI Pt is a 28yo male with hx of cholecystectomy 4-5 years ago presenting today c/o sudden onset severe RUQ abdominal pain radiating into RLQ associated with vomiting x 8 hours. Pt was evaluated on 12/20 in ED for n/v and discharged home. Pt states symptoms started suddenly today. Pain is 10/10, sharp and stabbing, does not radiat to back.  Cannot recall number of times he has vomiting.   Nothing makes pain better or worse. Denies chest pain, back pain, dysuria, or hematuria. Denies hx of renal stones.   Past Medical History  Diagnosis Date  . Medical history non-contributory    Past Surgical History  Procedure Laterality Date  . Cholecystectomy    . Tonsillectomy    . Incise and drain abcess     History reviewed. No pertinent family history. History  Substance Use Topics  . Smoking status: Never Smoker   . Smokeless tobacco: Never Used  . Alcohol Use: No    Review of Systems  Constitutional: Negative for fever and chills.  Gastrointestinal: Positive for nausea, vomiting and abdominal pain. Negative for diarrhea.  Genitourinary: Negative for dysuria, frequency, hematuria, flank pain and discharge.  Musculoskeletal: Negative for back pain.  All other systems reviewed and are negative.    Allergies  Review of patient's allergies indicates no known allergies.  Home Medications  No current outpatient prescriptions on file. BP 114/60  Pulse 82  Temp(Src) 98.1 F (36.7 C) (Oral)  Resp 18  SpO2 98% Physical Exam  Nursing note and vitals reviewed. Constitutional: He appears well-developed and well-nourished.  Pt lying on exam bed, rolling around, appears uncomfortable and in pain, holding abdomen.  HENT:   Head: Normocephalic and atraumatic.  Eyes: Conjunctivae are normal. No scleral icterus.  Neck: Normal range of motion.  Cardiovascular: Normal rate, regular rhythm and normal heart sounds.   Pulmonary/Chest: Effort normal and breath sounds normal. No respiratory distress. He has no wheezes. He has no rales. He exhibits no tenderness.  Abdominal: Soft. Bowel sounds are normal. He exhibits no distension and no mass. There is tenderness. There is rebound and guarding. There is no CVA tenderness.  Abd: non-distended. Severe tenderness with guarding in RUQ and RLQ.  No masses. No CVAT  Musculoskeletal: Normal range of motion.  Neurological: He is alert.  Skin: Skin is warm and dry.    ED Course  Procedures (including critical care time) Labs Review Labs Reviewed  CBC WITH DIFFERENTIAL - Abnormal; Notable for the following:    WBC 21.2 (*)    RBC 6.00 (*)    MCV 70.2 (*)    MCH 24.0 (*)    RDW 15.6 (*)    Platelets 417 (*)    Neutrophils Relative % 90 (*)    Neutro Abs 19.0 (*)    Lymphocytes Relative 7 (*)    All other components within normal limits  COMPREHENSIVE METABOLIC PANEL - Abnormal; Notable for the following:    CO2 15 (*)    Glucose, Bld 141 (*)    Creatinine, Ser 1.66 (*)    Calcium 11.4 (*)    Total Protein 10.0 (*)    Albumin 5.4 (*)    GFR calc non Af Amer 56 (*)    GFR calc Af Denyse Dago  64 (*)    All other components within normal limits  URINALYSIS, ROUTINE W REFLEX MICROSCOPIC - Abnormal; Notable for the following:    Specific Gravity, Urine >1.046 (*)    Hgb urine dipstick TRACE (*)    Bilirubin Urine SMALL (*)    Ketones, ur >80 (*)    Protein, ur 30 (*)    All other components within normal limits  URINE MICROSCOPIC-ADD ON - Abnormal; Notable for the following:    Casts HYALINE CASTS (*)    All other components within normal limits  LIPASE, BLOOD   Imaging Review Ct Chest Wo Contrast  07/26/2013   CLINICAL DATA:  Epigastric pain. Evaluate for  perforation. Concern of esophageal perforation.  EXAM: CT CHEST WITHOUT CONTRAST  TECHNIQUE: Multidetector CT imaging of the chest was performed following the standard protocol without IV contrast.  COMPARISON:  DG CHEST 2 VIEW dated 06/25/2013; DG CHEST 2 VIEW dated 06/13/2013; CT ABD/PELVIS W CM dated 07/26/2013  FINDINGS: Lungs/Pleura: There is small to moderate volume pneumomediastinum, including tracking into the neck. Lungs are clear, without pneumothorax or pleural fluid. No contrast extravasation from the esophagus. No esophageal defect.  Heart/Mediastinum: Normal heart size, without pericardial effusion. No mediastinal or definite hilar adenopathy, given limitations of unenhanced CT.  Upper Abdomen: Contrast within the stomach and renal collecting systems. Cholecystectomy.  Bones/Musculoskeletal:  No acute osseous abnormality.  IMPRESSION: Small to moderate volume pneumomediastinum, without cause identified. No esophageal defect or extraenteric contrast identified.   Electronically Signed   By: Jeronimo GreavesKyle  Talbot M.D.   On: 07/26/2013 20:16   Ct Abdomen Pelvis W Contrast  07/26/2013   CLINICAL DATA:  Nausea vomiting  EXAM: CT ABDOMEN AND PELVIS WITH CONTRAST  TECHNIQUE: Multidetector CT imaging of the abdomen and pelvis was performed using the standard protocol following bolus administration of intravenous contrast.  CONTRAST:  100mL OMNIPAQUE IOHEXOL 300 MG/ML  SOLN  COMPARISON:  None.  FINDINGS: The visualized lung bases are clear.  Subcentimeter hypodensity within the posterior right hepatic lobe is noted, too small the characterize by CT. The liver is otherwise unremarkable. Gallbladder is surgically absent. No biliary ductal dilatation. The spleen, adrenal glands, and pancreas demonstrate a normal contrast enhanced appearance.  The kidneys are equal in size with symmetric nephrograms. No hydronephrosis, nephrolithiasis, or focal enhancing renal mass.  Small volume of pneumomediastinum is partially  visualized within the lower thorax. Gas is also seen extending posteriorly within the right extrapleural space with a few foci of gas tracking posteriorly into the soft tissues near the right T11 and T12 neural foramen. No free intraperitoneal air identified.  The stomach is mildly distended with air-fluid level. No evidence of bowel obstruction. Appendix is not definitely visualize, however, no inflammatory changes are seen within the right lower quadrant to suggest acute appendicitis. No abnormal mucosal enhancement, wall thickening, or inflammatory fat stranding seen about the bowels.  Bladder is unremarkable.  Prostate is within normal limits.  No free fluid within the abdomen.  No pathologically enlarged intra-abdominal pelvic lymph nodes.  Osseous structures are within normal limits. No focal lytic or blastic osseous lesions.  IMPRESSION: 1. Small volume pneumomediastinum, of uncertain etiology, but may be related to history of recent vomiting. 2. No other CT evidence of acute intra-abdominal or pelvic process. Results were called by telephone at the time of interpretation on 07/26/2013 at 7:04 PM to Dr. Junius FinnerERIN O'MALLEY , who verbally acknowledged these results.   Electronically Signed   By: Janell QuietBenjamin  McClintock M.D.  On: 07/26/2013 19:04    EKG Interpretation   None       MDM   1. Pneumomediastinum   2. Abdominal pain   3. Nausea & vomiting    Pt presenting with severe abdominal pain associated with vomiting x 8 hours PTA.  Pt reports hx of same, however this time is worse. Reports being seen by [facility] but no records found in system.  Initial concern for appendicitis due to pt's level of discomfort and elevated WBC: 21.2  Pt also found to be in mild renal failure, Cr 1.66, BUN: WNL  2L IV fluids given.  CT abd: no evidence of acute intra-abdominal or pelvic process, however, small volume pneumomediastinum of uncertain etiology, may be related to recent vomiting.  Discussed pt with Dr. Gwendolyn Grant  who also examined pt.  Pt still uncomfortable after 1mg  dilaudid, will give 2nd mg.    CT chest with oral contrast to evaluate for possible esophageal perforation performed.  Shows: mild to moderate volume pneumomediastinum w/o cause identified. No esophageal defect or extraenteric contrast identified.  Will consult general surgery for further direction.  Likely need to admit pt for observation.    9:04 PM Dr. Gwendolyn Grant consulted with general surgery, Dr. Daphine Deutscher, who agreed to see pt tonight.  Dr. Gwendolyn Grant also consulted with GI who will follow up with pt in the morning.  Advised for pt to be admitted for observation.  Will consult hospitalist.   9:54 PM Consulted with Dr. Allena Katz, will admit pt to Tele obs.for pneumomediastinum             Junius Finner, PA-C 07/26/13 2155

## 2013-07-26 NOTE — Progress Notes (Signed)
ANTIBIOTIC CONSULT NOTE - INITIAL  Pharmacy Consult for Zosyn Indication: Suspected esophageal perforation  No Known Allergies  Patient Measurements: Height: 5\' 7"  (170.2 cm) Weight: 131 lb 8 oz (59.648 kg) IBW/kg (Calculated) : 66.1   Vital Signs: Temp: 98.3 F (36.8 C) (01/20 2255) Temp src: Oral (01/20 2255) BP: 118/65 mmHg (01/20 2255) Pulse Rate: 95 (01/20 2255) Intake/Output from previous day:   Intake/Output from this shift: Total I/O In: 2000 [I.V.:2000] Out: -   Labs:  Recent Labs  07/26/13 1531  WBC 21.2*  HGB 14.4  PLT 417*  CREATININE 1.66*   Estimated Creatinine Clearance: 56.8 ml/min (by C-G formula based on Cr of 1.66). No results found for this basename: VANCOTROUGH, VANCOPEAK, VANCORANDOM, GENTTROUGH, GENTPEAK, GENTRANDOM, TOBRATROUGH, TOBRAPEAK, TOBRARND, AMIKACINPEAK, AMIKACINTROU, AMIKACIN,  in the last 72 hours   Microbiology: No results found for this or any previous visit (from the past 720 hour(s)).  Medical History: Past Medical History  Diagnosis Date  . Medical history non-contributory     Medications:  Scheduled:  . heparin  5,000 Units Subcutaneous Q8H  . HYDROmorphone      . pantoprazole (PROTONIX) IV  40 mg Intravenous Q12H  . piperacillin-tazobactam (ZOSYN)  IV  3.375 g Intravenous Q8H  . sodium chloride  3 mL Intravenous Q12H   Infusions:  . sodium chloride 125 mL/hr (07/26/13 2347)   Assessment: 28 yo c/o n/v abdominal pain and pneumomediastinum.  Zosyn per Rx for suspected esophageal perforation.  Goal of Therapy:  Treat infection  Plan:   Zosyn 3.375 Gm IV q8h EI infusion  F/u Scr/levels/cultures as needed  Susanne GreenhouseGreen, Cherlynn Popiel R 07/26/2013,11:48 PM

## 2013-07-26 NOTE — H&P (Signed)
[facility] History and Physical  Patient: Levi Palmer  RUE:[id number]  DOB: [birth year]  DOS: the patient was seen and examined on 07/26/2013 PCP: No PCP Per Patient  Chief Complaint: Nausea and vomiting  HPI: Levi Palmer is a 28 y.o. male with Past medical history of cholecystectomy, substance abuse. The patient is coming from home. The patient presents with complaints of nausea and vomiting that started early in the morning. He mentions that he was at his baseline yesterday and when he woke up. Then he started having episodes of nausea and vomiting with 10+ episodes of vomiting. Some in between he is also started having some pain in his chest which was more located on the right lower chest rib cage and radiating to the right abdomen. He had 2 episodes of those motions without any blood. He also saw some blood streaked vomiting but no coffee-ground emesis or frank blood. He denies any fever, sick contact, outside food, medications. He also denied any use of alcohol, smoke cigarettes, IV drug abuse. He mentions he smoke marijuana around Christmas last time. He mentions he had similar episode in the past June 2014 which resolved on its own He also mentions that he has lost 10+ pound over last 1 month and he is not trying to lose weight. But he continues to deny any recurrent nausea or vomiting diarrhea or poor appetite.  Review of Systems: as mentioned in the history of present illness.  A Comprehensive review of the other systems is negative.  Past Medical History  Diagnosis Date  . Medical history non-contributory    Past Surgical History  Procedure Laterality Date  . Cholecystectomy    . Tonsillectomy    . Incise and drain abcess     Social History:  reports that he has never smoked. He has never used smokeless tobacco. He reports that he uses illicit drugs (Marijuana). He reports that he does not drink alcohol. Independent for most of his  ADL.  No  Known Allergies  History reviewed. No pertinent family history.  Prior to Admission medications   Not on File    Physical Exam: Filed Vitals:   07/26/13 1524 07/26/13 2129 07/26/13 2255  BP: 131/88 114/60 118/65  Pulse: 130 82 95  Temp: 97.8 F (36.6 C) 98.1 F (36.7 C) 98.3 F (36.8 C)  TempSrc: Oral Oral Oral  Resp: 18 18 18   Height:   5\' 7"  (1.702 m)  Weight:   59.648 kg (131 lb 8 oz)  SpO2: 98% 98% 100%    General: Alert, Awake and Oriented to Time, Place and Person. Appear in moderate distress Eyes: PERRL ENT: Oral Mucosa clear moist. Neck: No JVD Cardiovascular: S1 and S2 Present, no Murmur, Peripheral Pulses Present Respiratory: Bilateral Air entry equal and Decreased, Clear to Auscultation,  No Crackles, no wheezes Abdomen: Bowel Sound Present, Soft and diffuse tender on right side with voluntary guarding no rigidity Skin: No Rash Extremities: No Pedal edema, no calf tenderness Neurologic: Grossly Unremarkable.  Labs on Admission:  CBC:  Recent Labs Lab 07/26/13 1531  WBC 21.2*  NEUTROABS 19.0*  HGB 14.4  HCT 42.1  MCV 70.2*  PLT 417*    CMP     Component Value Date/Time   NA 140 07/26/2013 1531   K 4.5 07/26/2013 1531   CL 97 07/26/2013 1531   CO2 15* 07/26/2013 1531   GLUCOSE 141* 07/26/2013 1531   BUN 16 07/26/2013 1531   CREATININE 1.66* 07/26/2013 1531  CALCIUM 11.4* 07/26/2013 1531   PROT 10.0* 07/26/2013 1531   ALBUMIN 5.4* 07/26/2013 1531   AST 19 07/26/2013 1531   ALT 12 07/26/2013 1531   ALKPHOS 100 07/26/2013 1531   BILITOT 0.4 07/26/2013 1531   GFRNONAA 56* 07/26/2013 1531   GFRAA 64* 07/26/2013 1531     Recent Labs Lab 07/26/13 1531  LIPASE 13   No results found for this basename: AMMONIA,  in the last 168 hours  No results found for this basename: CKTOTAL, CKMB, CKMBINDEX, TROPONINI,  in the last 168 hours BNP (last 3 results)  Recent Labs  06/13/13 1444  PROBNP 21.1    Radiological Exams on Admission: Ct Chest Wo  Contrast  07/26/2013   CLINICAL DATA:  Epigastric pain. Evaluate for perforation. Concern of esophageal perforation.  EXAM: CT CHEST WITHOUT CONTRAST  TECHNIQUE: Multidetector CT imaging of the chest was performed following the standard protocol without IV contrast.  COMPARISON:  DG CHEST 2 VIEW dated 06/25/2013; DG CHEST 2 VIEW dated 06/13/2013; CT ABD/PELVIS W CM dated 07/26/2013  FINDINGS: Lungs/Pleura: There is small to moderate volume pneumomediastinum, including tracking into the neck. Lungs are clear, without pneumothorax or pleural fluid. No contrast extravasation from the esophagus. No esophageal defect.  Heart/Mediastinum: Normal heart size, without pericardial effusion. No mediastinal or definite hilar adenopathy, given limitations of unenhanced CT.  Upper Abdomen: Contrast within the stomach and renal collecting systems. Cholecystectomy.  Bones/Musculoskeletal:  No acute osseous abnormality.  IMPRESSION: Small to moderate volume pneumomediastinum, without cause identified. No esophageal defect or extraenteric contrast identified.   Electronically Signed   By: Jeronimo Greaves M.D.   On: 07/26/2013 20:16   Ct Abdomen Pelvis W Contrast  07/26/2013   CLINICAL DATA:  Nausea vomiting  EXAM: CT ABDOMEN AND PELVIS WITH CONTRAST  TECHNIQUE: Multidetector CT imaging of the abdomen and pelvis was performed using the standard protocol following bolus administration of intravenous contrast.  CONTRAST:  OMNIPAQUE IOHEXOL 300 MG/ML  SOLN  COMPARISON:  None.  FINDINGS: The visualized lung bases are clear.  Subcentimeter hypodensity within the posterior right hepatic lobe is noted, too small the characterize by CT. The liver is otherwise unremarkable. Gallbladder is surgically absent. No biliary ductal dilatation. The spleen, adrenal glands, and pancreas demonstrate a normal contrast enhanced appearance.  The kidneys are equal in size with symmetric nephrograms. No hydronephrosis, nephrolithiasis, or focal enhancing  renal mass.  Small volume of pneumomediastinum is partially visualized within the lower thorax. Gas is also seen extending posteriorly within the right extrapleural space with a few foci of gas tracking posteriorly into the soft tissues near the right T11 and T12 neural foramen. No free intraperitoneal air identified.  The stomach is mildly distended with air-fluid level. No evidence of bowel obstruction. Appendix is not definitely visualize, however, no inflammatory changes are seen within the right lower quadrant to suggest acute appendicitis. No abnormal mucosal enhancement, wall thickening, or inflammatory fat stranding seen about the bowels.  Bladder is unremarkable.  Prostate is within normal limits.  No free fluid within the abdomen.  No pathologically enlarged intra-abdominal pelvic lymph nodes.  Osseous structures are within normal limits. No focal lytic or blastic osseous lesions.  IMPRESSION: 1. Small volume pneumomediastinum, of uncertain etiology, but may be related to history of recent vomiting. 2. No other CT evidence of acute intra-abdominal or pelvic process. Results were called by telephone at the time of interpretation on 07/26/2013 at 7:04 PM to Dr. Junius Finner , who verbally acknowledged  these results.   Electronically Signed   By: Rise Mu M.D.   On: 07/26/2013 19:04   Assessment/Plan Principal Problem:   Pneumomediastinum Active Problems:   suspected Esophageal perforation   Gastroenteritis   AKI (acute kidney injury)   1. Pneumomediastinum The patient is presenting with complaints of right-sided pain that started after nausea and vomiting. Extensive evaluation in the ED is a showing acute kidney injury with normal lipase, significantly elevated total protein suggesting hemoconcentration with leukocytosis. He also had a CT scan of his chest and abdomen which is showing pneumomediastinum without any acute intra-abdominal or pelvic process. Also the CT scan report is  mentioning no evidence of esophageal defect. General surgery has been consulted who will follow the patient but at present does think any acute surgical intervention is needed. Currently we will keep the patient n.p.o., we will give him IV fluids aggressively. IV Protonix every 12 hours. IV Zofran as needed. Considering his pneumomediastinum I will give him IV with Zosyn. Blood cultures, urine cultures. Gastroenterology will also be considered by ED  2. Gastroenteritis Patient started having sudden episode of nausea and vomiting. At present I suspect his symptoms are secondary to acute gastroenteritis. There is no evidence of pancreatitis there is no evidence of other intra-abdominal pathology. Continue to monitor.  3. Acute kidney injury Patient appears significantly dehydrated suggesting his acute kidney injury secondary to prerenal etiology due to severe vomiting but there could be other etiologies as well. At present I would keep him aggressively hydrated. I would repeat his BMP. If the BNP phase to improve with hydration then he may require imaging to rule out other etiologies. Avoid nephrotoxic medication. Renal dozing off current medications  Consults: General surgery  DVT Prophylaxis: subcutaneous Heparin Nutrition: N.p.o. strict  Code Status: Full  Disposition: Admitted to inpatient in telemetry unit.  Author: Lynden Oxford, MD [facility] Pager: [phone] 07/26/2013, 11:28 PM    If 7PM-7AM, please contact night-coverage [email or web address] Password [email or web address]

## 2013-07-26 NOTE — ED Notes (Signed)
Pt c/o severe abd pain; vomiting x 8 hours; previous history of same

## 2013-07-26 NOTE — Progress Notes (Signed)
[facility] provided pt with a list of primary care resources and [facility] information.  °

## 2013-07-26 NOTE — Consult Note (Signed)
Chief Complaint:  Vomiting, abdominal pain and pneumomediastinum  History of Present Illness:  Levi Palmer is an 28 y.o. male who was hospitalized in 2010 with a similar occurrence of vomiting and mediastinal air presents with a day long history of vomiting.  It began this morning and was not large volume vomitus but occurred about twice an hour until he came to the ER for evaluation.  CT scan shows some posterior mediastinal are but was otherwise negative for perforation.    Past Medical History  Diagnosis Date  . Medical history non-contributory     Past Surgical History  Procedure Laterality Date  . Cholecystectomy    . Tonsillectomy    . Incise and drain abcess      No current facility-administered medications for this encounter.   No current outpatient prescriptions on file.   Review of patient's allergies indicates no known allergies. History reviewed. No pertinent family history. Social History:   reports that he has never smoked. He has never used smokeless tobacco. He reports that he uses illicit drugs (Marijuana). He reports that he does not drink alcohol.   REVIEW OF SYSTEMS - PERTINENT POSITIVES ONLY: noncontributory  Physical Exam:   Blood pressure 114/60, pulse 82, temperature 98.1 F (36.7 C), temperature source Oral, resp. rate 18, SpO2 98.00%. There is no weight on file to calculate BMI.  Gen:  WDWN WM lying on his stomach in some distress Neurological: Alert and oriented to person, place, and time. Motor and sensory function is grossly intact  Head: Normocephalic and atraumatic.  Eyes: Conjunctivae are normal. Pupils are equal, round, and reactive to light. No scleral icterus.  Neck: Normal range of motion. Neck supple. No tracheal deviation or thyromegaly present.  Cardiovascular:  SR without murmurs or gallops.  No carotid bruits Respiratory: Effort normal.  No respiratory distress. No chest wall tenderness. Breath sounds normal.  No wheezes,  rales or rhonchi.  Abdomen:  Tender to deep palpation especially on the right.   GU: Musculoskeletal: Normal range of motion. Extremities are nontender. No cyanosis, edema or clubbing noted Lymphadenopathy: No cervical, preauricular, postauricular or axillary adenopathy is present Skin: Skin is warm and dry. No rash noted. No diaphoresis. No erythema. No pallor. Pscyh: Normal mood and affect. Behavior is normal. Judgment and thought content normal.   LABORATORY RESULTS: Results for orders placed during the hospital encounter of 07/26/13 (from the past 48 hour(s))  CBC WITH DIFFERENTIAL     Status: Abnormal   Collection Time    07/26/13  3:31 PM      Result Value Range   WBC 21.2 (*) 4.0 - 10.5 K/uL   RBC 6.00 (*) 4.22 - 5.81 MIL/uL   Hemoglobin 14.4  13.0 - 17.0 g/dL   HCT 42.1  39.0 - 52.0 %   MCV 70.2 (*) 78.0 - 100.0 fL   MCH 24.0 (*) 26.0 - 34.0 pg   MCHC 34.2  30.0 - 36.0 g/dL   RDW 15.6 (*) 11.5 - 15.5 %   Platelets 417 (*) 150 - 400 K/uL   Neutrophils Relative % 90 (*) 43 - 77 %   Neutro Abs 19.0 (*) 1.7 - 7.7 K/uL   Lymphocytes Relative 7 (*) 12 - 46 %   Lymphs Abs 1.5  0.7 - 4.0 K/uL   Monocytes Relative 3  3 - 12 %   Monocytes Absolute 0.7  0.1 - 1.0 K/uL   Eosinophils Relative 0  0 - 5 %   Eosinophils Absolute  0.0  0.0 - 0.7 K/uL   Basophils Relative 0  0 - 1 %   Basophils Absolute 0.0  0.0 - 0.1 K/uL  COMPREHENSIVE METABOLIC PANEL     Status: Abnormal   Collection Time    07/26/13  3:31 PM      Result Value Range   Sodium 140  137 - 147 mEq/L   Comment: REPEATED TO VERIFY   Potassium 4.5  3.7 - 5.3 mEq/L   Comment: REPEATED TO VERIFY   Chloride 97  96 - 112 mEq/L   Comment: REPEATED TO VERIFY   CO2 15 (*) 19 - 32 mEq/L   Glucose, Bld 141 (*) 70 - 99 mg/dL   BUN 16  6 - 23 mg/dL   Creatinine, Ser 1.66 (*) 0.50 - 1.35 mg/dL   Calcium 11.4 (*) 8.4 - 10.5 mg/dL   Total Protein 10.0 (*) 6.0 - 8.3 g/dL   Albumin 5.4 (*) 3.5 - 5.2 g/dL   AST 19  0 - 37 U/L   ALT  12  0 - 53 U/L   Alkaline Phosphatase 100  39 - 117 U/L   Total Bilirubin 0.4  0.3 - 1.2 mg/dL   GFR calc non Af Amer 56 (*) >90 mL/min   GFR calc Af Amer 64 (*) >90 mL/min   Comment: (NOTE)     The eGFR has been calculated using the CKD EPI equation.     This calculation has not been validated in all clinical situations.     eGFR's persistently <90 mL/min signify possible Chronic Kidney     Disease.  LIPASE, BLOOD     Status: None   Collection Time    07/26/13  3:31 PM      Result Value Range   Lipase 13  11 - 59 U/L  URINALYSIS, ROUTINE W REFLEX MICROSCOPIC     Status: Abnormal   Collection Time    07/26/13  7:51 PM      Result Value Range   Color, Urine YELLOW  YELLOW   APPearance CLEAR  CLEAR   Specific Gravity, Urine >1.046 (*) 1.005 - 1.030   pH 6.5  5.0 - 8.0   Glucose, UA NEGATIVE  NEGATIVE mg/dL   Hgb urine dipstick TRACE (*) NEGATIVE   Bilirubin Urine SMALL (*) NEGATIVE   Ketones, ur >80 (*) NEGATIVE mg/dL   Protein, ur 30 (*) NEGATIVE mg/dL   Urobilinogen, UA 0.2  0.0 - 1.0 mg/dL   Nitrite NEGATIVE  NEGATIVE   Leukocytes, UA NEGATIVE  NEGATIVE  URINE MICROSCOPIC-ADD ON     Status: Abnormal   Collection Time    07/26/13  7:51 PM      Result Value Range   Squamous Epithelial / LPF RARE  RARE   WBC, UA 0-2  <3 WBC/hpf   RBC / HPF 0-2  <3 RBC/hpf   Casts HYALINE CASTS (*) NEGATIVE   Urine-Other MUCOUS PRESENT      RADIOLOGY RESULTS: Ct Chest Wo Contrast  07/26/2013   CLINICAL DATA:  Epigastric pain. Evaluate for perforation. Concern of esophageal perforation.  EXAM: CT CHEST WITHOUT CONTRAST  TECHNIQUE: Multidetector CT imaging of the chest was performed following the standard protocol without IV contrast.  COMPARISON:  DG CHEST 2 VIEW dated 06/25/2013; DG CHEST 2 VIEW dated 06/13/2013; CT ABD/PELVIS W CM dated 07/26/2013  FINDINGS: Lungs/Pleura: There is small to moderate volume pneumomediastinum, including tracking into the neck. Lungs are clear, without  pneumothorax or pleural fluid. No contrast  extravasation from the esophagus. No esophageal defect.  Heart/Mediastinum: Normal heart size, without pericardial effusion. No mediastinal or definite hilar adenopathy, given limitations of unenhanced CT.  Upper Abdomen: Contrast within the stomach and renal collecting systems. Cholecystectomy.  Bones/Musculoskeletal:  No acute osseous abnormality.  IMPRESSION: Small to moderate volume pneumomediastinum, without cause identified. No esophageal defect or extraenteric contrast identified.   Electronically Signed   By: Abigail Miyamoto M.D.   On: 07/26/2013 20:16   Ct Abdomen Pelvis W Contrast  07/26/2013   CLINICAL DATA:  Nausea vomiting  EXAM: CT ABDOMEN AND PELVIS WITH CONTRAST  TECHNIQUE: Multidetector CT imaging of the abdomen and pelvis was performed using the standard protocol following bolus administration of intravenous contrast.  CONTRAST:  166m OMNIPAQUE IOHEXOL 300 MG/ML  SOLN  COMPARISON:  None.  FINDINGS: The visualized lung bases are clear.  Subcentimeter hypodensity within the posterior right hepatic lobe is noted, too small the characterize by CT. The liver is otherwise unremarkable. Gallbladder is surgically absent. No biliary ductal dilatation. The spleen, adrenal glands, and pancreas demonstrate a normal contrast enhanced appearance.  The kidneys are equal in size with symmetric nephrograms. No hydronephrosis, nephrolithiasis, or focal enhancing renal mass.  Small volume of pneumomediastinum is partially visualized within the lower thorax. Gas is also seen extending posteriorly within the right extrapleural space with a few foci of gas tracking posteriorly into the soft tissues near the right T11 and T12 neural foramen. No free intraperitoneal air identified.  The stomach is mildly distended with air-fluid level. No evidence of bowel obstruction. Appendix is not definitely visualize, however, no inflammatory changes are seen within the right lower quadrant  to suggest acute appendicitis. No abnormal mucosal enhancement, wall thickening, or inflammatory fat stranding seen about the bowels.  Bladder is unremarkable.  Prostate is within normal limits.  No free fluid within the abdomen.  No pathologically enlarged intra-abdominal pelvic lymph nodes.  Osseous structures are within normal limits. No focal lytic or blastic osseous lesions.  IMPRESSION: 1. Small volume pneumomediastinum, of uncertain etiology, but may be related to history of recent vomiting. 2. No other CT evidence of acute intra-abdominal or pelvic process. Results were called by telephone at the time of interpretation on 07/26/2013 at 7:04 PM to Dr. ENoland Fordyce, who verbally acknowledged these results.   Electronically Signed   By: BJeannine BogaM.D.   On: 07/26/2013 19:04    Problem List: Patient Active Problem List   Diagnosis Date Noted  . Pneumomediastinum 07/26/2013  . Vomiting 06/13/2013  . Chest wall muscle strain 06/13/2013  . Pain, dental 06/13/2013    Assessment & Plan: There is no history of pyloric stenosis.  He has never had a GI workup.  He did have gallstones and chole when he was 22.  Last year, he said he vomited on average twice a week.    This could be barotrauma with forceful wretching but hopefully is not a perforation.  There is no left pleural effusion.  He would benefit from endoscopy and GI workup.    Recommend:  Admission  With IV antibiotics and observation. . Gastrograffin swallow tomorrow.  My need upper endo at some point.      Matt B. MHassell Done MD, FCompass Behavioral Center Of AlexandriaSurgery, [facility] [phone] [phone] 07/26/2013 10:06 PM

## 2013-07-27 ENCOUNTER — Inpatient Hospital Stay (HOSPITAL_COMMUNITY): Payer: Self-pay

## 2013-07-27 LAB — COMPREHENSIVE METABOLIC PANEL
ALBUMIN: 3.5 g/dL (ref 3.5–5.2)
ALT: 7 U/L (ref 0–53)
AST: 15 U/L (ref 0–37)
Alkaline Phosphatase: 63 U/L (ref 39–117)
BUN: 16 mg/dL (ref 6–23)
CO2: 21 mEq/L (ref 19–32)
Calcium: 8.5 mg/dL (ref 8.4–10.5)
Chloride: 105 mEq/L (ref 96–112)
Creatinine, Ser: 0.99 mg/dL (ref 0.50–1.35)
GFR calc Af Amer: >90 mL/min (ref >90)
GFR calc non Af Amer: >90 mL/min (ref >90)
GLUCOSE: 92 mg/dL (ref 70–99)
POTASSIUM: 4.1 meq/L (ref 3.7–5.3)
SODIUM: 142 meq/L (ref 137–147)
TOTAL PROTEIN: 6.7 g/dL (ref 6.0–8.3)
Total Bilirubin: 0.3 mg/dL (ref 0.3–1.2)

## 2013-07-27 LAB — CBC
HCT: 31.5 % — ABNORMAL LOW (ref 39.0–52.0)
HEMOGLOBIN: 10.4 g/dL — AB (ref 13.0–17.0)
MCH: 23.6 pg — ABNORMAL LOW (ref 26.0–34.0)
MCHC: 33 g/dL (ref 30.0–36.0)
MCV: 71.4 fL — AB (ref 78.0–100.0)
Platelets: 273 10*3/uL (ref 150–400)
RBC: 4.41 MIL/uL (ref 4.22–5.81)
RDW: 15.9 % — ABNORMAL HIGH (ref 11.5–15.5)
WBC: 12.3 10*3/uL — AB (ref 4.0–10.5)

## 2013-07-27 LAB — PROTIME-INR
INR: 1.22 (ref 0.00–1.49)
Prothrombin Time: 15.1 seconds (ref 11.6–15.2)

## 2013-07-27 MED ORDER — IOHEXOL 300 MG/ML  SOLN
50.0000 mL | Freq: Once | INTRAMUSCULAR | Status: AC | PRN
  Administered 2013-07-27: 50 mL via INTRAVENOUS

## 2013-07-27 MED ORDER — HYDROMORPHONE HCL PF 1 MG/ML IJ SOLN
0.5000 mg | Freq: Once | INTRAMUSCULAR | Status: AC
  Administered 2013-07-27: 0.5 mg via INTRAVENOUS

## 2013-07-27 MED ORDER — KCL IN DEXTROSE-NACL 20-5-0.45 MEQ/L-%-% IV SOLN
INTRAVENOUS | Status: DC
  Administered 2013-07-27 – 2013-07-30 (×6): via INTRAVENOUS
  Administered 2013-07-31: 1000 mL via INTRAVENOUS
  Administered 2013-08-01: 03:00:00 via INTRAVENOUS
  Filled 2013-07-27 (×16): qty 1000

## 2013-07-27 MED ORDER — HYDROMORPHONE HCL PF 1 MG/ML IJ SOLN
0.5000 mg | Freq: Once | INTRAMUSCULAR | Status: AC
  Administered 2013-07-27: 0.5 mg via INTRAVENOUS
  Filled 2013-07-27: qty 1

## 2013-07-27 MED ORDER — HYDROMORPHONE HCL PF 1 MG/ML IJ SOLN
1.0000 mg | INTRAMUSCULAR | Status: DC | PRN
  Administered 2013-07-27 – 2013-08-02 (×46): 1 mg via INTRAVENOUS
  Filled 2013-07-27 (×49): qty 1

## 2013-07-27 NOTE — Progress Notes (Signed)
Subjective: Complaining of ongoing pain.  No nausea currently, just pain. He reports daily nausea and vomiting back to 2010, he got better this summer after moving to [place], but now he is starting to have episodes again.  He has chronic problems with his teeth which he attributes to long term nausea and vomiting.  The episode yesterday started with waking up with diarrhea and then 8-9 hours of nausea and vomiting.    Objective: Vital signs in last 24 hours: Temp:  [97.8 F (36.6 C)-98.3 F (36.8 C)] 97.8 F (36.6 C) (01/21 0524) Pulse Rate:  [67-130] 67 (01/21 0524) Resp:  [16-18] 16 (01/21 0524) BP: (106-131)/(44-88) 106/44 mmHg (01/21 0524) SpO2:  [98 %-100 %] 100 % (01/21 0524) Weight:  [59.648 kg (131 lb 8 oz)-60.827 kg (134 lb 1.6 oz)] 60.827 kg (134 lb 1.6 oz) (01/21 0524) Last BM Date: 07/26/13 Afebrile, VSS CMP is normal WBC is down to 12.3 DG Esophagus W/Water Sol CM:  The patient drank Omnipaque 300. No evidence of an esophageal or gastroesophageal junction leak. CXR this AM:  Pneumomediastinum. Negative for pneumothorax. Lungs clear.  Intake/Output from previous day: 01/20 0701 - 01/21 0700 In: 3050 [I.V.:3000; IV Piggyback:50] Out: 400 [Urine:400] Intake/Output this shift:    General appearance: alert, cooperative, mild distress and complaining of pain, and current dose of dilaudid is not helping. Cachectic young male. Resp: clear to auscultation bilaterally GI: he is tender all over, no distension, + BS, last BM yesteday  Lab Results:   Recent Labs  07/26/13 1531 07/27/13 0510  WBC 21.2* 12.3*  HGB 14.4 10.4*  HCT 42.1 31.5*  PLT 417* 273    BMET  Recent Labs  07/26/13 1531 07/27/13 0510  NA 140 142  K 4.5 4.1  CL 97 105  CO2 15* 21  GLUCOSE 141* 92  BUN 16 16  CREATININE 1.66* 0.99  CALCIUM 11.4* 8.5   PT/INR  Recent Labs  07/27/13 0510  LABPROT 15.1  INR 1.22     Recent Labs Lab 07/26/13 1531 07/27/13 0510  AST 19 15  ALT 12  7  ALKPHOS 100 63  BILITOT 0.4 0.3  PROT 10.0* 6.7  ALBUMIN 5.4* 3.5     Lipase     Component Value Date/Time   LIPASE 13 07/26/2013 1531     Studies/Results: X-ray Chest Pa And Lateral   07/27/2013   CLINICAL DATA:  Pneumomediastinum.  EXAM: CHEST  2 VIEW  COMPARISON:  CT chest 07/26/2012 and PA and lateral chest 06/25/2013.  FINDINGS: Pneumomediastinum is identified as on the CT scan. There is no pneumothorax. The lungs are clear. Heart size is normal. No pleural effusion is seen. No focal bony abnormality.  IMPRESSION: Pneumomediastinum.  Negative for pneumothorax.  Lungs clear.   Electronically Signed   By: Drusilla Kanner M.D.   On: 07/27/2013 07:54   Ct Chest Wo Contrast  07/26/2013   CLINICAL DATA:  Epigastric pain. Evaluate for perforation. Concern of esophageal perforation.  EXAM: CT CHEST WITHOUT CONTRAST  TECHNIQUE: Multidetector CT imaging of the chest was performed following the standard protocol without IV contrast.  COMPARISON:  DG CHEST 2 VIEW dated 06/25/2013; DG CHEST 2 VIEW dated 06/13/2013; CT ABD/PELVIS W CM dated 07/26/2013  FINDINGS: Lungs/Pleura: There is small to moderate volume pneumomediastinum, including tracking into the neck. Lungs are clear, without pneumothorax or pleural fluid. No contrast extravasation from the esophagus. No esophageal defect.  Heart/Mediastinum: Normal heart size, without pericardial effusion. No mediastinal or definite hilar  adenopathy, given limitations of unenhanced CT.  Upper Abdomen: Contrast within the stomach and renal collecting systems. Cholecystectomy.  Bones/Musculoskeletal:  No acute osseous abnormality.  IMPRESSION: Small to moderate volume pneumomediastinum, without cause identified. No esophageal defect or extraenteric contrast identified.   Electronically Signed   By: Jeronimo GreavesKyle  Talbot M.D.   On: 07/26/2013 20:16   Ct Abdomen Pelvis W Contrast  07/26/2013   CLINICAL DATA:  Nausea vomiting  EXAM: CT ABDOMEN AND PELVIS WITH CONTRAST   TECHNIQUE: Multidetector CT imaging of the abdomen and pelvis was performed using the standard protocol following bolus administration of intravenous contrast.  CONTRAST:  100mL OMNIPAQUE IOHEXOL 300 MG/ML  SOLN  COMPARISON:  None.  FINDINGS: The visualized lung bases are clear.  Subcentimeter hypodensity within the posterior right hepatic lobe is noted, too small the characterize by CT. The liver is otherwise unremarkable. Gallbladder is surgically absent. No biliary ductal dilatation. The spleen, adrenal glands, and pancreas demonstrate a normal contrast enhanced appearance.  The kidneys are equal in size with symmetric nephrograms. No hydronephrosis, nephrolithiasis, or focal enhancing renal mass.  Small volume of pneumomediastinum is partially visualized within the lower thorax. Gas is also seen extending posteriorly within the right extrapleural space with a few foci of gas tracking posteriorly into the soft tissues near the right T11 and T12 neural foramen. No free intraperitoneal air identified.  The stomach is mildly distended with air-fluid level. No evidence of bowel obstruction. Appendix is not definitely visualize, however, no inflammatory changes are seen within the right lower quadrant to suggest acute appendicitis. No abnormal mucosal enhancement, wall thickening, or inflammatory fat stranding seen about the bowels.  Bladder is unremarkable.  Prostate is within normal limits.  No free fluid within the abdomen.  No pathologically enlarged intra-abdominal pelvic lymph nodes.  Osseous structures are within normal limits. No focal lytic or blastic osseous lesions.  IMPRESSION: 1. Small volume pneumomediastinum, of uncertain etiology, but may be related to history of recent vomiting. 2. No other CT evidence of acute intra-abdominal or pelvic process. Results were called by telephone at the time of interpretation on 07/26/2013 at 7:04 PM to Dr. Junius FinnerERIN O'MALLEY , who verbally acknowledged these results.    Electronically Signed   By: Rise MuBenjamin  McClintock M.D.   On: 07/26/2013 19:04   Dg Esophagus W/water Sol Cm  07/27/2013   CLINICAL DATA:  Pneumomediastinum. Evaluate for esophageal perforation.  EXAM: ESOPHOGRAM/BARIUM SWALLOW  TECHNIQUE: Single contrast examination was performed using  water-soluble.  COMPARISON:  DG CHEST 2 VIEW dated 07/27/2013; CT CHEST W/O CM dated 07/26/2013  FLUOROSCOPY TIME:  0 min 33 seconds.  FINDINGS: The patient drank Omnipaque 300. No evidence of an esophageal or gastroesophageal junction leak.  IMPRESSION: No leak.   Electronically Signed   By: Leanna BattlesMelinda  Blietz M.D.   On: 07/27/2013 09:06    Medications: . heparin  5,000 Units Subcutaneous Q8H  . pantoprazole (PROTONIX) IV  40 mg Intravenous Q12H  . piperacillin-tazobactam (ZOSYN)  IV  3.375 g Intravenous Q8H  . sodium chloride  3 mL Intravenous Q12H    Assessment/Plan 1.  Nausea, vomiting and Pneumomediastinum 2.  Pt has hx of nausea and vomiting daily since 2010, got better this summer on moving to [place], but has started again with weekly episodes of nausea and vomiting. 3. Weight loss  Plan:  CXR and esophageal swallow are OK,  I have talked with medicine.  I would increase his pain medicine, PPI, bid and ask GI to see.  LOS: 1 day    Tim Corriher 07/27/2013

## 2013-07-27 NOTE — Progress Notes (Signed)
INITIAL NUTRITION ASSESSMENT  DOCUMENTATION CODES Per approved criteria  -Not Applicable   INTERVENTION: -Recommend Resource Breeze po BID, each supplement provides 250 kcal and 9 grams of protein with diet advancement to clear liquid -Recommend to transition to [employer] w/diet advancement to full liquid/solid foods -Diet advancement per MD  NUTRITION DIAGNOSIS: Inadequate oral intake related to inability to eat as evidenced by NPO status.   Goal: Pt to meet >/= 90% of their estimated nutrition needs    Monitor:  Diet advancement, GI profile, labs, weights, total protein/energy intake  Reason for Assessment: MST  28 y.o. male  Admitting Dx: Pneumomediastinum  ASSESSMENT: The patient presents with complaints of nausea and vomiting that started early in the morning. He mentions that he was at his baseline yesterday and when he woke up. Then he started having episodes of nausea and vomiting with 10+ episodes of vomiting. Some in between he is also started having some pain in his chest which was more located on the right lower chest rib cage and radiating to the right abdomen. He had 2 episodes of those motions without any blood. He also saw some blood streaked vomiting but no coffee-ground emesis or frank blood  -Pt w/unintentional wt loss of 10 lbs in one month -Usual body weight is around 145 lbs. -Weighed 165 lbs in 2010, weight has steadily decreased after pt gallbladder removed and GI issues. -Pt's lowest weight was 115 lbs, but was able to gradually increase weight. -Diet recalls showed pt often skips breakfast, but it able to consume normal sized lunch and dinner, which is often fast foods.Drinks [employer] occasionally. Will recommend upon diet advancement -Noted that morning is when pt often feels nauseous. This used to occur more frequently, but since has improved over past year  -Encouraged pt to try bland foods at breakfast to  incorporate more kcal/proteins into diet.  -Pt also willing to try Resource Breeze when MD advances to clear liquids -MD recd bowel rest and hydration  Height: Ht Readings from Last 1 Encounters:  07/26/13 5\' 7"  (1.702 m)    Weight: Wt Readings from Last 1 Encounters:  07/27/13 134 lb 1.6 oz (60.827 kg)    Ideal Body Weight:148 lbs   % Ideal Body Weight: 91%  Wt Readings from Last 10 Encounters:  07/27/13 134 lb 1.6 oz (60.827 kg)  06/13/13 147 lb (66.679 kg)    Usual Body Weight: 145  % Usual Body Weight: 93%  BMI:  Body mass index is 21 kg/(m^2).  Estimated Nutritional Needs: Kcal: 1800-2000 Protein:70-80 gram Fluid: 2100 ml/daily  Skin: WDL  Diet Order: NPO  EDUCATION NEEDS: -No education needs identified at this time   Intake/Output Summary (Last 24 hours) at 07/27/13 1504 Last data filed at 07/27/13 0700  Gross per 24 hour  Intake   3050 ml  Output    400 ml  Net   2650 ml    Last BM: 1/20   Labs:   Recent Labs Lab 07/26/13 1531 07/27/13 0510  NA 140 142  K 4.5 4.1  CL 97 105  CO2 15* 21  BUN 16 16  CREATININE 1.66* 0.99  CALCIUM 11.4* 8.5  GLUCOSE 141* 92    CBG (last 3)  No results found for this basename: GLUCAP,  in the last 72 hours  Scheduled Meds: . heparin  5,000 Units Subcutaneous Q8H  . pantoprazole (PROTONIX) IV  40 mg Intravenous Q12H  . piperacillin-tazobactam (ZOSYN)  IV  3.375 g  Intravenous Q8H  . sodium chloride  3 mL Intravenous Q12H    Continuous Infusions: . dextrose 5 % and 0.45 % NaCl with KCl 20 mEq/L 100 mL/hr at 07/27/13 1039    Past Medical History  Diagnosis Date  . Medical history non-contributory     Past Surgical History  Procedure Laterality Date  . Cholecystectomy    . Tonsillectomy    . Incise and drain abcess      Lloyd HugerSarah F Darcee Dekker MS RD LDN Clinical Dietitian Pager:[phone]

## 2013-07-27 NOTE — Consult Note (Signed)
Referring Provider: Dr. Lynden Oxford [facility]) Primary Care Physician:  No PCP Per Patient Primary Gastroenterologist:  None (unassigned)  Reason for Consultation:  Pneumomediastinum, chronic GI symptoms with sporadic vomiting  HPI: Levi Palmer is a 28 y.o. male admitted to the emergency room last night when he developed severe epigastric pain yesterday morning in association with vomiting.  A chest CT on admission showed pneumomediastinum to be present, and he had significant leukocytosis which has resolved overnight on antibiotic therapy. However, he remains extremely uncomfortable.  Interestingly, the patient indicates that he had an episode similar to this in 2010, at which time he was hospitalized in [place], [place]. He does not think he had an endoscopy at that time. He did have an endoscopy in [facility] in 2011 around the time that he had his gallbladder taken out for recurring episodes of chest pain.  For the past several years, the patient has had problems with nausea and vomiting especially in the morning. However, over the past year, these episodes have decreased in frequency, such that they now have only occurred a few times over the past year. One of those was yesterday morning. In contrast to other episodes, where he typically gets some pain in the midportion of his chest in association with a vomiting, yesterday morning, the pain was present more in the subxiphoid and epigastric area. Interestingly, on careful questioning, it sounds as though the pain was present prior to the vomiting.  The patient denies any known ingestion of sharp objects that may have perforated the esophagus, such as toothpicks, broken glass, etc.  At baseline, the patient does not really have much in the way of classic reflux symptomatology. Because he does get periodic chest pain, he recently put himself on over-the-counter Prilosec starting a couple of weeks ago, which she took without  any obvious improvement. It is difficult to get a sharp, clear, focused history from this individual, but I do not get the impression that he has chronic or ongoing chest discomfort or reflux symptomatology. I don't believe there have been any issues with esophageal dysphagia, either. He denies weight loss over the past year.  A Gastrografin swallow has just been performed, and shows no evidence of a leak in his esophagus.  I see in the social history where this patient does admit to marijuana use, although I did not question him about that.   Past Medical History  Diagnosis Date  . Medical history non-contributory     Past Surgical History  Procedure Laterality Date  . Cholecystectomy    . Tonsillectomy    . Incise and drain abcess      Prior to Admission medications   Not on File    Current Facility-Administered Medications  Medication Dose Route Frequency Provider Last Rate Last Dose  . 0.9 %  sodium chloride infusion   Intravenous Continuous Lynden Oxford, MD 125 mL/hr at 07/27/13 0833    . heparin injection 5,000 Units  5,000 Units Subcutaneous Q8H Lynden Oxford, MD   5,000 Units at 07/27/13 0553  . HYDROmorphone (DILAUDID) injection 0.5 mg  0.5 mg Intravenous Q4H PRN Lynden Oxford, MD   0.5 mg at 07/27/13 0557  . ondansetron (ZOFRAN) tablet 4 mg  4 mg Oral Q6H PRN Lynden Oxford, MD       Or  . ondansetron (ZOFRAN) injection 4 mg  4 mg Intravenous Q6H PRN Lynden Oxford, MD   4 mg at 07/27/13 0759  . pantoprazole (PROTONIX) injection 40 mg  40 mg Intravenous Q12H Lynden Oxford, MD   40 mg at 07/26/13 2346  . piperacillin-tazobactam (ZOSYN) IVPB 3.375 g  3.375 g Intravenous Q8H Lorenza Evangelist, RPH   3.375 g at 07/27/13 0801  . sodium chloride 0.9 % injection 3 mL  3 mL Intravenous Q12H Lynden Oxford, MD        Allergies as of 07/26/2013  . (No Known Allergies)    History reviewed. No pertinent family history.  History   Social History  . Marital Status: Single    Spouse  Name: N/A    Number of Children: N/A  . Years of Education:  The patient is originally from [place], then lived in [place], [place] and subsequently in [place] before moving here. He works at the [employer] on [employer] N/A   Occupational History  . Not on file.   Social History Main Topics  . Smoking status: Never Smoker   . Smokeless tobacco: Never Used  . Alcohol Use: No  . Drug Use: Yes    Special: Marijuana     Comment: occasionally  . Sexual Activity: Not on file   Other Topics Concern  . Not on file   Social History Narrative  . No narrative on file    Review of Systems: See history of present illness. Denies lower tract symptoms such as constipation, diarrhea, or rectal bleeding   Physical Exam: Vital signs in last 24 hours: Temp:  [97.8 F (36.6 C)-98.3 F (36.8 C)] 97.8 F (36.6 C) (01/21 0524) Pulse Rate:  [67-130] 67 (01/21 0524) Resp:  [16-18] 16 (01/21 0524) BP: (106-131)/(44-88) 106/44 mmHg (01/21 0524) SpO2:  [98 %-100 %] 100 % (01/21 0524) Weight:  [59.648 kg (131 lb 8 oz)-60.827 kg (134 lb 1.6 oz)] 60.827 kg (134 lb 1.6 oz) (01/21 0524) Last BM Date: 07/26/13 This is a well-nourished, generally healthy-appearing young Caucasian male with multiple tattoos. He is in obvious discomfort, writhing around in the bed during the interview, although he was lying quietly in bed curled up when I entered the room.  He is anicteric and without pallor. I do not feel any subcutaneous emphysema or other abnormalities the. The chest is clear. The heart is without any pericardial rub or murmur, but the rate is fairly rapid, probably of around 1:15. The abdomen is firm and diffusely tender with voluntary guarding, but without peritoneal findings. Neurologically, the patient is grossly intact, and his mood and cognition seem to be normal.  Intake/Output from previous day: 01/20 0701 - 01/21 0700 In: 3050 [I.V.:3000; IV Piggyback:50] Out: 400  [Urine:400] Intake/Output this shift:    Lab Results:  Recent Labs  07/26/13 1531 07/27/13 0510  WBC 21.2* 12.3*  HGB 14.4 10.4*  HCT 42.1 31.5*  PLT 417* 273   BMET  Recent Labs  07/26/13 1531 07/27/13 0510  NA 140 142  K 4.5 4.1  CL 97 105  CO2 15* 21  GLUCOSE 141* 92  BUN 16 16  CREATININE 1.66* 0.99  CALCIUM 11.4* 8.5   LFT  Recent Labs  07/27/13 0510  PROT 6.7  ALBUMIN 3.5  AST 15  ALT 7  ALKPHOS 63  BILITOT 0.3   PT/INR  Recent Labs  07/27/13 0510  LABPROT 15.1  INR 1.22     Studies/Results: X-ray Chest Pa And Lateral   07/27/2013   CLINICAL DATA:  Pneumomediastinum.  EXAM: CHEST  2 VIEW  COMPARISON:  CT chest 07/26/2012 and PA and lateral chest 06/25/2013.  FINDINGS: Pneumomediastinum is identified as on the CT scan. There is no pneumothorax. The lungs are clear. Heart size is normal. No pleural effusion is seen. No focal bony abnormality.  IMPRESSION: Pneumomediastinum.  Negative for pneumothorax.  Lungs clear.   Electronically Signed   By: Drusilla Kannerhomas  Dalessio M.D.   On: 07/27/2013 07:54   Ct Chest Wo Contrast  07/26/2013   CLINICAL DATA:  Epigastric pain. Evaluate for perforation. Concern of esophageal perforation.  EXAM: CT CHEST WITHOUT CONTRAST  TECHNIQUE: Multidetector CT imaging of the chest was performed following the standard protocol without IV contrast.  COMPARISON:  DG CHEST 2 VIEW dated 06/25/2013; DG CHEST 2 VIEW dated 06/13/2013; CT ABD/PELVIS W CM dated 07/26/2013  FINDINGS: Lungs/Pleura: There is small to moderate volume pneumomediastinum, including tracking into the neck. Lungs are clear, without pneumothorax or pleural fluid. No contrast extravasation from the esophagus. No esophageal defect.  Heart/Mediastinum: Normal heart size, without pericardial effusion. No mediastinal or definite hilar adenopathy, given limitations of unenhanced CT.  Upper Abdomen: Contrast within the stomach and renal collecting systems. Cholecystectomy.   Bones/Musculoskeletal:  No acute osseous abnormality.  IMPRESSION: Small to moderate volume pneumomediastinum, without cause identified. No esophageal defect or extraenteric contrast identified.   Electronically Signed   By: Jeronimo GreavesKyle  Talbot M.D.   On: 07/26/2013 20:16   Ct Abdomen Pelvis W Contrast  07/26/2013   CLINICAL DATA:  Nausea vomiting  EXAM: CT ABDOMEN AND PELVIS WITH CONTRAST  TECHNIQUE: Multidetector CT imaging of the abdomen and pelvis was performed using the standard protocol following bolus administration of intravenous contrast.  CONTRAST:  100mL OMNIPAQUE IOHEXOL 300 MG/ML  SOLN  COMPARISON:  None.  FINDINGS: The visualized lung bases are clear.  Subcentimeter hypodensity within the posterior right hepatic lobe is noted, too small the characterize by CT. The liver is otherwise unremarkable. Gallbladder is surgically absent. No biliary ductal dilatation. The spleen, adrenal glands, and pancreas demonstrate a normal contrast enhanced appearance.  The kidneys are equal in size with symmetric nephrograms. No hydronephrosis, nephrolithiasis, or focal enhancing renal mass.  Small volume of pneumomediastinum is partially visualized within the lower thorax. Gas is also seen extending posteriorly within the right extrapleural space with a few foci of gas tracking posteriorly into the soft tissues near the right T11 and T12 neural foramen. No free intraperitoneal air identified.  The stomach is mildly distended with air-fluid level. No evidence of bowel obstruction. Appendix is not definitely visualize, however, no inflammatory changes are seen within the right lower quadrant to suggest acute appendicitis. No abnormal mucosal enhancement, wall thickening, or inflammatory fat stranding seen about the bowels.  Bladder is unremarkable.  Prostate is within normal limits.  No free fluid within the abdomen.  No pathologically enlarged intra-abdominal pelvic lymph nodes.  Osseous structures are within normal limits.  No focal lytic or blastic osseous lesions.  IMPRESSION: 1. Small volume pneumomediastinum, of uncertain etiology, but may be related to history of recent vomiting. 2. No other CT evidence of acute intra-abdominal or pelvic process. Results were called by telephone at the time of interpretation on 07/26/2013 at 7:04 PM to Dr. Junius FinnerERIN O'MALLEY , who verbally acknowledged these results.   Electronically Signed   By: Rise MuBenjamin  McClintock M.D.   On: 07/26/2013 19:04   Dg Esophagus W/water Sol Cm  07/27/2013   CLINICAL DATA:  Pneumomediastinum. Evaluate for esophageal perforation.  EXAM: ESOPHOGRAM/BARIUM SWALLOW  TECHNIQUE: Single contrast examination was performed using  water-soluble.  COMPARISON:  DG CHEST 2 VIEW  dated 07/27/2013; CT CHEST W/O CM dated 07/26/2013  FLUOROSCOPY TIME:  0 min 33 seconds.  FINDINGS: The patient drank Omnipaque 300. No evidence of an esophageal or gastroesophageal junction leak.  IMPRESSION: No leak.   Electronically Signed   By: Leanna Battles M.D.   On: 07/27/2013 09:06    Impression: 1. Pneumomediastinum of unclear cause. Not 100% clear that this is related to his vomiting, since the patient indicates, by history, that the pain was present prior to vomiting. Perhaps the pain is due to something else and then he vomited and pneumomediastinum developed thereafter.  2. History of chronic intermittent nonspecific GI symptoms characterized by nausea and chest pain  Plan: Symptomatic management. I agree with pain medication and antibiotics. It does not appear that there is an indication for surgery or esophageal stent placement, given the absence of a leak on his Gastrografin swallow.  I would try to avoid endoscopy in this patient in the acute setting, in case there is a leak that has sealed off in a somewhat tenuous fashion. My concern is that the instrumentation and air insufflation associated with endoscopy, to mention nothing of the retching that sometimes occurs with that  procedure, could potentially disrupt his healing process and lead to recurrent pneumomediastinum or even perhaps a frank Boerhaave's.  Instead, if GI tract evaluation is felt to be warranted during this admission, I would consider doing an upper GI series, which would define anatomy and function pretty well. If he continues to have symptoms, consideration could be given to elective outpatient endoscopic evaluation after several weeks, done under propofol sedation so as to minimize retching.  It may be helpful to inquire about the timing of marijuana consumption in association with his episodes of nausea and vomiting, since it is known to disrupt gastric motility, and thus is a potential cause of sporadic nausea and vomiting.     LOS: 1 day   Kiley Solimine V  07/27/2013, 9:43 AM

## 2013-07-27 NOTE — Progress Notes (Signed)
[facility] PROGRESS NOTE  Levi Palmer WUJ:[id number] DOB: 02-10-1986 DOA: 07/26/2013 PCP: No PCP Per Patient  Assessment/Plan:  Principal Problem:   Pneumomediastinum - GI and surgery on board and we appreciate their input. - Continue bowel rest and PPI - Zosyn  Active Problems:   suspected Esophageal perforation -  Suspected microperforation which is currently not detected on esophagogram/barium swallow     AKI (acute kidney injury) - resolved with fluids and supportive therapy.  Code Status: full Family Communication: None at bedside Disposition Plan: Once cleared who from surgery or GI perspective   Consultants:  Gastroenterology  General surgery  Procedures:  Barium swallow  Antibiotics:  Zosyn  HPI/Subjective: Patient denies any recent marijuana use. No new complaints  Objective: Filed Vitals:   07/27/13 1423  BP: 111/66  Pulse: 75  Temp: 98.3 F (36.8 C)  Resp: 18    Intake/Output Summary (Last 24 hours) at 07/27/13 1902 Last data filed at 07/27/13 1851  Gross per 24 hour  Intake   3050 ml  Output    800 ml  Net   2250 ml   Filed Weights   07/26/13 2255 07/27/13 0524  Weight: 59.648 kg (131 lb 8 oz) 60.827 kg (134 lb 1.6 oz)    Exam:   General:  Pt in nad, alert and awake  Cardiovascular: rrr, no mrg  Respiratory: CTA BL, no wheezes  Abdomen: soft, NT, ND  Musculoskeletal: no cyanosis or clubbing.   Data Reviewed: Basic Metabolic Panel:  Recent Labs Lab 07/26/13 1531 07/27/13 0510  NA 140 142  K 4.5 4.1  CL 97 105  CO2 15* 21  GLUCOSE 141* 92  BUN 16 16  CREATININE 1.66* 0.99  CALCIUM 11.4* 8.5   Liver Function Tests:  Recent Labs Lab 07/26/13 1531 07/27/13 0510  AST 19 15  ALT 12 7  ALKPHOS 100 63  BILITOT 0.4 0.3  PROT 10.0* 6.7  ALBUMIN 5.4* 3.5    Recent Labs Lab 07/26/13 1531  LIPASE 13   No results found for this basename: AMMONIA,  in the last 168 hours CBC:  Recent  Labs Lab 07/26/13 1531 07/27/13 0510  WBC 21.2* 12.3*  NEUTROABS 19.0*  --   HGB 14.4 10.4*  HCT 42.1 31.5*  MCV 70.2* 71.4*  PLT 417* 273   Cardiac Enzymes: No results found for this basename: CKTOTAL, CKMB, CKMBINDEX, TROPONINI,  in the last 168 hours BNP (last 3 results)  Recent Labs  06/13/13 1444  PROBNP 21.1   CBG: No results found for this basename: GLUCAP,  in the last 168 hours  No results found for this or any previous visit (from the past 240 hour(s)).   Studies: X-ray Chest Pa And Lateral   07/27/2013   CLINICAL DATA:  Pneumomediastinum.  EXAM: CHEST  2 VIEW  COMPARISON:  CT chest 07/26/2012 and PA and lateral chest 06/25/2013.  FINDINGS: Pneumomediastinum is identified as on the CT scan. There is no pneumothorax. The lungs are clear. Heart size is normal. No pleural effusion is seen. No focal bony abnormality.  IMPRESSION: Pneumomediastinum.  Negative for pneumothorax.  Lungs clear.   Electronically Signed   By: Drusilla Kannerhomas  Dalessio M.D.   On: 07/27/2013 07:54   Ct Chest Wo Contrast  07/26/2013   CLINICAL DATA:  Epigastric pain. Evaluate for perforation. Concern of esophageal perforation.  EXAM: CT CHEST WITHOUT CONTRAST  TECHNIQUE: Multidetector CT imaging of the chest was performed following the standard protocol without IV contrast.  COMPARISON:  DG CHEST 2 VIEW dated 06/25/2013; DG CHEST 2 VIEW dated 06/13/2013; CT ABD/PELVIS W CM dated 07/26/2013  FINDINGS: Lungs/Pleura: There is small to moderate volume pneumomediastinum, including tracking into the neck. Lungs are clear, without pneumothorax or pleural fluid. No contrast extravasation from the esophagus. No esophageal defect.  Heart/Mediastinum: Normal heart size, without pericardial effusion. No mediastinal or definite hilar adenopathy, given limitations of unenhanced CT.  Upper Abdomen: Contrast within the stomach and renal collecting systems. Cholecystectomy.  Bones/Musculoskeletal:  No acute osseous abnormality.   IMPRESSION: Small to moderate volume pneumomediastinum, without cause identified. No esophageal defect or extraenteric contrast identified.   Electronically Signed   By: Jeronimo Greaves M.D.   On: 07/26/2013 20:16   Ct Abdomen Pelvis W Contrast  07/26/2013   CLINICAL DATA:  Nausea vomiting  EXAM: CT ABDOMEN AND PELVIS WITH CONTRAST  TECHNIQUE: Multidetector CT imaging of the abdomen and pelvis was performed using the standard protocol following bolus administration of intravenous contrast.  CONTRAST:  OMNIPAQUE IOHEXOL 300 MG/ML  SOLN  COMPARISON:  None.  FINDINGS: The visualized lung bases are clear.  Subcentimeter hypodensity within the posterior right hepatic lobe is noted, too small the characterize by CT. The liver is otherwise unremarkable. Gallbladder is surgically absent. No biliary ductal dilatation. The spleen, adrenal glands, and pancreas demonstrate a normal contrast enhanced appearance.  The kidneys are equal in size with symmetric nephrograms. No hydronephrosis, nephrolithiasis, or focal enhancing renal mass.  Small volume of pneumomediastinum is partially visualized within the lower thorax. Gas is also seen extending posteriorly within the right extrapleural space with a few foci of gas tracking posteriorly into the soft tissues near the right T11 and T12 neural foramen. No free intraperitoneal air identified.  The stomach is mildly distended with air-fluid level. No evidence of bowel obstruction. Appendix is not definitely visualize, however, no inflammatory changes are seen within the right lower quadrant to suggest acute appendicitis. No abnormal mucosal enhancement, wall thickening, or inflammatory fat stranding seen about the bowels.  Bladder is unremarkable.  Prostate is within normal limits.  No free fluid within the abdomen.  No pathologically enlarged intra-abdominal pelvic lymph nodes.  Osseous structures are within normal limits. No focal lytic or blastic osseous lesions.  IMPRESSION:  1. Small volume pneumomediastinum, of uncertain etiology, but may be related to history of recent vomiting. 2. No other CT evidence of acute intra-abdominal or pelvic process. Results were called by telephone at the time of interpretation on 07/26/2013 at 7:04 PM to Dr. Junius Finner , who verbally acknowledged these results.   Electronically Signed   By: Rise Mu M.D.   On: 07/26/2013 19:04   Dg Esophagus W/water Sol Cm  07/27/2013   CLINICAL DATA:  Pneumomediastinum. Evaluate for esophageal perforation.  EXAM: ESOPHOGRAM/BARIUM SWALLOW  TECHNIQUE: Single contrast examination was performed using  water-soluble.  COMPARISON:  DG CHEST 2 VIEW dated 07/27/2013; CT CHEST W/O CM dated 07/26/2013  FLUOROSCOPY TIME:  0 min 33 seconds.  FINDINGS: The patient drank Omnipaque 300. No evidence of an esophageal or gastroesophageal junction leak.  IMPRESSION: No leak.   Electronically Signed   By: Leanna Battles M.D.   On: 07/27/2013 09:06    Scheduled Meds: . heparin  5,000 Units Subcutaneous Q8H  . pantoprazole (PROTONIX) IV  40 mg Intravenous Q12H  . piperacillin-tazobactam (ZOSYN)  IV  3.375 g Intravenous Q8H  . sodium chloride  3 mL Intravenous Q12H   Continuous Infusions: . dextrose 5 % and 0.45 %  NaCl with KCl 20 mEq/L 100 mL/hr at 07/27/13 1039    Time spent: > 35 minutesa    Amylah Will, [facility]  [facility] Pager [phone]. If 7PM-7AM, please contact night-coverage at [email or web address], password [facility] 07/27/2013, 7:02 PM  LOS: 1 day

## 2013-07-27 NOTE — Progress Notes (Signed)
General surgery attending note:  I have personally interviewed and examined this patient this morning. I have reviewed all his imaging studies to date. I reviewed Dr. Donavan BurnetBuccini's advice.  His history is unusual, and confirmation of past history is impossible because he has moved around a lot. We did [facility], [facility], and his ongoing problems with intermittent vomiting. There  Gastrografin swallow shows no leak. He does have some mediastinal air but no fluid collection.  He complains of right upper quadrant pain. On examination his lungs are clear. Abdomen is soft and nondistended. Subjectively tender in the right upper quadrant but no guarding.Leukocytosis is resolving and he is stable.  Assessment/Recommendations: Nausea vomiting and pneumomediastinum.  Historically, this is a recurrent problem. 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 No evidence of mediastinal fluid collection. I would continue bowel rest, hydration, PPI's and IV antibiotics.  Eventually, he will need a GI workup.  I am trying to contact the thoracic surgeon on call to ask if they have any further advice at this point.   Angelia MouldHaywood M. Derrell Palmer, M.D., [facility] General and Minimally invasive Surgery Breast and Colorectal Surgery Office:   [phone] Pager:   [phone]

## 2013-07-27 NOTE — Progress Notes (Signed)
Pt was upset due to his NPO status & after all the doctors had came & seen him, he still dont know whats  going on. Explained to pt why his NPO per surgeon recs. He requested  to talk to MD again. Dr. Cena BentonVega notified but not available at this time.

## 2013-07-27 NOTE — Progress Notes (Signed)
Pt was given Dilaudid 1mg  at 1835, after 1hr pt complained that he didn't get the "right dose" because he did not feel the medicine.

## 2013-07-28 DIAGNOSIS — R1110 Vomiting, unspecified: Secondary | ICD-10-CM

## 2013-07-28 LAB — CBC
HEMATOCRIT: 32 % — AB (ref 39.0–52.0)
Hemoglobin: 10 g/dL — ABNORMAL LOW (ref 13.0–17.0)
MCH: 23 pg — AB (ref 26.0–34.0)
MCHC: 31.3 g/dL (ref 30.0–36.0)
MCV: 73.6 fL — AB (ref 78.0–100.0)
PLATELETS: 209 10*3/uL (ref 150–400)
RBC: 4.35 MIL/uL (ref 4.22–5.81)
RDW: 16.3 % — AB (ref 11.5–15.5)
WBC: 8.1 10*3/uL (ref 4.0–10.5)

## 2013-07-28 MED ORDER — PROMETHAZINE HCL 25 MG/ML IJ SOLN
12.5000 mg | Freq: Four times a day (QID) | INTRAMUSCULAR | Status: DC | PRN
  Administered 2013-07-28 – 2013-07-31 (×9): 12.5 mg via INTRAVENOUS
  Filled 2013-07-28 (×12): qty 1

## 2013-07-28 NOTE — Progress Notes (Signed)
Subjective: Patient states that he has less pain. Passing flatus but no stool today. Says he is hungry and would like to drink. He admits to having a lot of anxiety following interactions with caregivers. He is worried about all of the different things that he has been told.  Afebrile. No tachycardia. WBC has now normalized. Hemoglobin stable at 10.0.  I reviewed all his imaging studies and history and with Dr. Sharmaine Base. Both he and I feel that the patient most likely had a microperforation of his esophagus. Isolated mediastinal air can be seen with pneumothorax however this seems less likely given the history of vomiting and not cough, Leukocytosis, and  in the absence of any blebs or pneumothorax.  Objective: Vital signs in last 24 hours: Temp:  [97.5 F (36.4 C)-98.4 F (36.9 C)] 97.5 F (36.4 C) (01/22 0536) Pulse Rate:  [64-75] 64 (01/22 0536) Resp:  [16-18] 16 (01/22 0536) BP: (111-119)/(58-68) 119/58 mmHg (01/22 0536) SpO2:  [97 %-100 %] 98 % (01/22 0536) Weight:  [137 lb 9.1 oz (62.4 kg)] 137 lb 9.1 oz (62.4 kg) (01/22 0352) Last BM Date: 07/26/13  Intake/Output from previous day: 01/21 0701 - 01/22 0700 In: 1578.3 [P.O.:240; I.V.:1288.3; IV Piggyback:50] Out: 800 [Urine:800] Intake/Output this shift: Total I/O In: -  Out: 425 [Urine:425]  General appearance: seems anxious. Alert. Cooperative. Moving around in bed a lot. Does not look it or toxic. Resp: clear to auscultation bilaterally. No crepitus in neck. GI:  Abdomen is soft and nondistended. There is some subjective tenderness throughout the abdomen but nothing localized. No guarding no rebound no peritoneal signs.  Lab Results:   Recent Labs  07/27/13 0510 07/28/13 0419  WBC 12.3* 8.1  HGB 10.4* 10.0*  HCT 31.5* 32.0*  PLT 273 209   BMET  Recent Labs  07/26/13 1531 07/27/13 0510  NA 140 142  K 4.5 4.1  CL 97 105  CO2 15* 21  GLUCOSE 141* 92  BUN 16 16  CREATININE 1.66* 0.99  CALCIUM 11.4*  8.5   PT/INR  Recent Labs  07/27/13 0510  LABPROT 15.1  INR 1.22   ABG No results found for this basename: PHART, PCO2, PO2, HCO3,  in the last 72 hours  Studies/Results: X-ray Chest Pa And Lateral   07/27/2013   CLINICAL DATA:  Pneumomediastinum.  EXAM: CHEST  2 VIEW  COMPARISON:  CT chest 07/26/2012 and PA and lateral chest 06/25/2013.  FINDINGS: Pneumomediastinum is identified as on the CT scan. There is no pneumothorax. The lungs are clear. Heart size is normal. No pleural effusion is seen. No focal bony abnormality.  IMPRESSION: Pneumomediastinum.  Negative for pneumothorax.  Lungs clear.   Electronically Signed   By: Drusilla Kanner M.D.   On: 07/27/2013 07:54   Ct Chest Wo Contrast  07/26/2013   CLINICAL DATA:  Epigastric pain. Evaluate for perforation. Concern of esophageal perforation.  EXAM: CT CHEST WITHOUT CONTRAST  TECHNIQUE: Multidetector CT imaging of the chest was performed following the standard protocol without IV contrast.  COMPARISON:  DG CHEST 2 VIEW dated 06/25/2013; DG CHEST 2 VIEW dated 06/13/2013; CT ABD/PELVIS W CM dated 07/26/2013  FINDINGS: Lungs/Pleura: There is small to moderate volume pneumomediastinum, including tracking into the neck. Lungs are clear, without pneumothorax or pleural fluid. No contrast extravasation from the esophagus. No esophageal defect.  Heart/Mediastinum: Normal heart size, without pericardial effusion. No mediastinal or definite hilar adenopathy, given limitations of unenhanced CT.  Upper Abdomen: Contrast within the stomach and renal  collecting systems. Cholecystectomy.  Bones/Musculoskeletal:  No acute osseous abnormality.  IMPRESSION: Small to moderate volume pneumomediastinum, without cause identified. No esophageal defect or extraenteric contrast identified.   Electronically Signed   By: Jeronimo GreavesKyle  Talbot M.D.   On: 07/26/2013 20:16   Ct Abdomen Pelvis W Contrast  07/26/2013   CLINICAL DATA:  Nausea vomiting  EXAM: CT ABDOMEN AND PELVIS WITH  CONTRAST  TECHNIQUE: Multidetector CT imaging of the abdomen and pelvis was performed using the standard protocol following bolus administration of intravenous contrast.  CONTRAST:  100mL OMNIPAQUE IOHEXOL 300 MG/ML  SOLN  COMPARISON:  None.  FINDINGS: The visualized lung bases are clear.  Subcentimeter hypodensity within the posterior right hepatic lobe is noted, too small the characterize by CT. The liver is otherwise unremarkable. Gallbladder is surgically absent. No biliary ductal dilatation. The spleen, adrenal glands, and pancreas demonstrate a normal contrast enhanced appearance.  The kidneys are equal in size with symmetric nephrograms. No hydronephrosis, nephrolithiasis, or focal enhancing renal mass.  Small volume of pneumomediastinum is partially visualized within the lower thorax. Gas is also seen extending posteriorly within the right extrapleural space with a few foci of gas tracking posteriorly into the soft tissues near the right T11 and T12 neural foramen. No free intraperitoneal air identified.  The stomach is mildly distended with air-fluid level. No evidence of bowel obstruction. Appendix is not definitely visualize, however, no inflammatory changes are seen within the right lower quadrant to suggest acute appendicitis. No abnormal mucosal enhancement, wall thickening, or inflammatory fat stranding seen about the bowels.  Bladder is unremarkable.  Prostate is within normal limits.  No free fluid within the abdomen.  No pathologically enlarged intra-abdominal pelvic lymph nodes.  Osseous structures are within normal limits. No focal lytic or blastic osseous lesions.  IMPRESSION: 1. Small volume pneumomediastinum, of uncertain etiology, but may be related to history of recent vomiting. 2. No other CT evidence of acute intra-abdominal or pelvic process. Results were called by telephone at the time of interpretation on 07/26/2013 at 7:04 PM to Dr. Junius FinnerERIN O'MALLEY , who verbally acknowledged these  results.   Electronically Signed   By: Rise MuBenjamin  McClintock M.D.   On: 07/26/2013 19:04   Dg Esophagus W/water Sol Cm  07/27/2013   CLINICAL DATA:  Pneumomediastinum. Evaluate for esophageal perforation.  EXAM: ESOPHOGRAM/BARIUM SWALLOW  TECHNIQUE: Single contrast examination was performed using  water-soluble.  COMPARISON:  DG CHEST 2 VIEW dated 07/27/2013; CT CHEST W/O CM dated 07/26/2013  FLUOROSCOPY TIME:  0 min 33 seconds.  FINDINGS: The patient drank Omnipaque 300. No evidence of an esophageal or gastroesophageal junction leak.  IMPRESSION: No leak.   Electronically Signed   By: Leanna BattlesMelinda  Blietz M.D.   On: 07/27/2013 09:06    Anti-infectives: Anti-infectives   Start     Dose/Rate Route Frequency Ordered Stop   07/26/13 2359  piperacillin-tazobactam (ZOSYN) IVPB 3.375 g     3.375 g 12.5 mL/hr over 240 Minutes Intravenous Every 8 hours 07/26/13 2332        Assessment/Plan:  Nausea vomiting and pneumomediastinum. Historically, this is a recurrent problem. 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 Given the distribution of air, this could be a fairly proximal perforation.  No evidence of mediastinal fluid collection.  I would keep him n.p.o. For one more day and then started liquids tomorrow.  I would see how he does with the clear liquids for 24 hours and then do the upper  GI series on Saturday. It is not known when swallowing barium would be safe, but that seems reasonable.  Continue broad-spectrum antibiotics and high dose proton pump inhibitors.  Eventually, he will  likely need an endoscopic GI workup.     LOS: 2 days    Sorah Falkenstein M 07/28/2013

## 2013-07-28 NOTE — Progress Notes (Signed)
[facility] PROGRESS NOTE  Levi BlanksMichael [name] WUJ:[id number] DOB: 07/26/1985 DOA: 07/26/2013 PCP: No PCP Per Patient  Assessment/Plan:  Principal Problem:   Pneumomediastinum/suspected Esophageal perforation - GI and surgery on board and we appreciate their input. I will defer further management to them at this juncture. Currently recommending keeping patient at bowel rest for one more day and starting liquids next a.m. with plans on doing GI series on Saturday. - Zosyn on board  Active Problems:     AKI (acute kidney injury) - resolved with fluids and supportive therapy.  Code Status: full Family Communication: None at bedside Disposition Plan: Once cleared who from surgery or GI perspective   Consultants:  Gastroenterology  General surgery  Procedures:  Barium swallow  Antibiotics:  Zosyn  HPI/Subjective: Patient denies any recent marijuana use. No new complaints reported to me. Received a call the patient wanted to shower.  Objective: Filed Vitals:   07/28/13 1450  BP: 107/63  Pulse: 68  Temp: 97.4 F (36.3 C)  Resp: 18    Intake/Output Summary (Last 24 hours) at 07/28/13 1729 Last data filed at 07/28/13 1100  Gross per 24 hour  Intake 1578.33 ml  Output    825 ml  Net 753.33 ml   Filed Weights   07/26/13 2255 07/27/13 0524 07/28/13 0352  Weight: 59.648 kg (131 lb 8 oz) 60.827 kg (134 lb 1.6 oz) 62.4 kg (137 lb 9.1 oz)    Exam:   General:  Pt in nad, alert and awake  Cardiovascular: rrr, no mrg  Respiratory: CTA BL, no wheezes  Abdomen: soft, NT, ND  Musculoskeletal: no cyanosis or clubbing.   Data Reviewed: Basic Metabolic Panel:  Recent Labs Lab 07/26/13 1531 07/27/13 0510  NA 140 142  K 4.5 4.1  CL 97 105  CO2 15* 21  GLUCOSE 141* 92  BUN 16 16  CREATININE 1.66* 0.99  CALCIUM 11.4* 8.5   Liver Function Tests:  Recent Labs Lab 07/26/13 1531 07/27/13 0510  AST 19 15  ALT 12 7  ALKPHOS 100 63  BILITOT  0.4 0.3  PROT 10.0* 6.7  ALBUMIN 5.4* 3.5    Recent Labs Lab 07/26/13 1531  LIPASE 13   No results found for this basename: AMMONIA,  in the last 168 hours CBC:  Recent Labs Lab 07/26/13 1531 07/27/13 0510 07/28/13 0419  WBC 21.2* 12.3* 8.1  NEUTROABS 19.0*  --   --   HGB 14.4 10.4* 10.0*  HCT 42.1 31.5* 32.0*  MCV 70.2* 71.4* 73.6*  PLT 417* 273 209   Cardiac Enzymes: No results found for this basename: CKTOTAL, CKMB, CKMBINDEX, TROPONINI,  in the last 168 hours BNP (last 3 results)  Recent Labs  06/13/13 1444  PROBNP 21.1   CBG: No results found for this basename: GLUCAP,  in the last 168 hours  Recent Results (from the past 240 hour(s))  CULTURE, BLOOD (ROUTINE X 2)     Status: None   Collection Time    07/26/13 12:15 AM      Result Value Range Status   Specimen Description BLOOD LEFT ARM   Final   Special Requests BOTTLES DRAWN AEROBIC AND ANAEROBIC 5ML   Final   Culture  Setup Time     Final   Value: 07/27/2013 03:26     [facility]   Culture     Final   Value:        BLOOD CULTURE RECEIVED NO GROWTH TO DATE CULTURE WILL  BE HELD FOR 5 DAYS BEFORE ISSUING A FINAL NEGATIVE REPORT     Performed at [facility]   Report Status PENDING   Incomplete  CULTURE, BLOOD (ROUTINE X 2)     Status: None   Collection Time    07/26/13 12:20 AM      Result Value Range Status   Specimen Description BLOOD RIGHT HAND   Final   Special Requests BOTTLES DRAWN AEROBIC AND ANAEROBIC   Final   Culture  Setup Time     Final   Value: 07/27/2013 03:26     Performed at [facility]   Culture     Final   Value:        BLOOD CULTURE RECEIVED NO GROWTH TO DATE CULTURE WILL BE HELD FOR 5 DAYS BEFORE ISSUING A FINAL NEGATIVE REPORT     Performed at [facility]   Report Status PENDING   Incomplete     Studies: X-ray Chest Pa And Lateral   07/27/2013   CLINICAL DATA:  Pneumomediastinum.  EXAM: CHEST  2 VIEW  COMPARISON:  CT chest  07/26/2012 and PA and lateral chest 06/25/2013.  FINDINGS: Pneumomediastinum is identified as on the CT scan. There is no pneumothorax. The lungs are clear. Heart size is normal. No pleural effusion is seen. No focal bony abnormality.  IMPRESSION: Pneumomediastinum.  Negative for pneumothorax.  Lungs clear.   Electronically Signed   By: Drusilla Kanner M.D.   On: 07/27/2013 07:54   Ct Chest Wo Contrast  07/26/2013   CLINICAL DATA:  Epigastric pain. Evaluate for perforation. Concern of esophageal perforation.  EXAM: CT CHEST WITHOUT CONTRAST  TECHNIQUE: Multidetector CT imaging of the chest was performed following the standard protocol without IV contrast.  COMPARISON:  DG CHEST 2 VIEW dated 06/25/2013; DG CHEST 2 VIEW dated 06/13/2013; CT ABD/PELVIS W CM dated 07/26/2013  FINDINGS: Lungs/Pleura: There is small to moderate volume pneumomediastinum, including tracking into the neck. Lungs are clear, without pneumothorax or pleural fluid. No contrast extravasation from the esophagus. No esophageal defect.  Heart/Mediastinum: Normal heart size, without pericardial effusion. No mediastinal or definite hilar adenopathy, given limitations of unenhanced CT.  Upper Abdomen: Contrast within the stomach and renal collecting systems. Cholecystectomy.  Bones/Musculoskeletal:  No acute osseous abnormality.  IMPRESSION: Small to moderate volume pneumomediastinum, without cause identified. No esophageal defect or extraenteric contrast identified.   Electronically Signed   By: Jeronimo Greaves M.D.   On: 07/26/2013 20:16   Ct Abdomen Pelvis W Contrast  07/26/2013   CLINICAL DATA:  Nausea vomiting  EXAM: CT ABDOMEN AND PELVIS WITH CONTRAST  TECHNIQUE: Multidetector CT imaging of the abdomen and pelvis was performed using the standard protocol following bolus administration of intravenous contrast.  CONTRAST:  OMNIPAQUE IOHEXOL 300 MG/ML  SOLN  COMPARISON:  None.  FINDINGS: The visualized lung bases are clear.  Subcentimeter  hypodensity within the posterior right hepatic lobe is noted, too small the characterize by CT. The liver is otherwise unremarkable. Gallbladder is surgically absent. No biliary ductal dilatation. The spleen, adrenal glands, and pancreas demonstrate a normal contrast enhanced appearance.  The kidneys are equal in size with symmetric nephrograms. No hydronephrosis, nephrolithiasis, or focal enhancing renal mass.  Small volume of pneumomediastinum is partially visualized within the lower thorax. Gas is also seen extending posteriorly within the right extrapleural space with a few foci of gas tracking posteriorly into the soft tissues near the right T11 and T12 neural foramen. No free  intraperitoneal air identified.  The stomach is mildly distended with air-fluid level. No evidence of bowel obstruction. Appendix is not definitely visualize, however, no inflammatory changes are seen within the right lower quadrant to suggest acute appendicitis. No abnormal mucosal enhancement, wall thickening, or inflammatory fat stranding seen about the bowels.  Bladder is unremarkable.  Prostate is within normal limits.  No free fluid within the abdomen.  No pathologically enlarged intra-abdominal pelvic lymph nodes.  Osseous structures are within normal limits. No focal lytic or blastic osseous lesions.  IMPRESSION: 1. Small volume pneumomediastinum, of uncertain etiology, but may be related to history of recent vomiting. 2. No other CT evidence of acute intra-abdominal or pelvic process. Results were called by telephone at the time of interpretation on 07/26/2013 at 7:04 PM to Dr. Junius Finner , who verbally acknowledged these results.   Electronically Signed   By: Rise Mu M.D.   On: 07/26/2013 19:04   Dg Esophagus W/water Sol Cm  07/27/2013   CLINICAL DATA:  Pneumomediastinum. Evaluate for esophageal perforation.  EXAM: ESOPHOGRAM/BARIUM SWALLOW  TECHNIQUE: Single contrast examination was performed using   water-soluble.  COMPARISON:  DG CHEST 2 VIEW dated 07/27/2013; CT CHEST W/O CM dated 07/26/2013  FLUOROSCOPY TIME:  0 min 33 seconds.  FINDINGS: The patient drank Omnipaque 300. No evidence of an esophageal or gastroesophageal junction leak.  IMPRESSION: No leak.   Electronically Signed   By: Leanna Battles M.D.   On: 07/27/2013 09:06    Scheduled Meds: . heparin  5,000 Units Subcutaneous Q8H  . pantoprazole (PROTONIX) IV  40 mg Intravenous Q12H  . piperacillin-tazobactam (ZOSYN)  IV  3.375 g Intravenous Q8H  . sodium chloride  3 mL Intravenous Q12H   Continuous Infusions: . dextrose 5 % and 0.45 % NaCl with KCl 20 mEq/L 100 mL/hr at 07/28/13 0617    Time spent: > 35 minutesa    Levi Palmer, [facility]  [facility] Pager [phone]. If 7PM-7AM, please contact night-coverage at [email or web address], password [facility] 07/28/2013, 5:29 PM  LOS: 2 days

## 2013-07-28 NOTE — Progress Notes (Signed)
Stable overnight. Less pain. Would like to at least drink liquids, if not actually food.  White count has normalized, from 21,000 on admission 2 12,000 yesterday morning to 8000 this morning. Afebrile.  Exam: Patient is lying in bed, appears very comfortable but slightly pale. He is anicteric. The chest is completely clear to auscultation and there is no evident pleuritic discomfort. No pleural rubs are heard anteriorly. Heart rhythm is regular, much less tachycardic than yesterday, no rubs or murmurs or arrhythmias appreciated.  Impression: I remain unclear as to the genesis of this patient's apparent recurrent episodes of pneumomediastinum. He denies use of THC immediately prior to the episode. He does note that, in general, his episodes of feeling sick originate while he is having a bowel movement, and then develops nausea and heaving; however, I am not able to explain that physiologically. He indicates that in general, his bowel movements are normal--it is not as though these episodes developed while he is straining at stool, or having diarrhea. There is no history of blood, mucus, or significant irregularity to suggest underlying inflammatory bowel disease.  Recommendation:  1. I will defer to the surgeons as to when this patient can start clear liquids. My own impression is that, given the clinical improvement over the past 36 hours, and the absence of a leak in his Gastrografin swallow yesterday, that he would probably be okay to start clear liquids today.  2. I would recommend an upper GI series tomorrow if the surgeons agree that it is safe to perform one. In my opinion, this is the best way to evaluate the upper tract in close approximation to his episode of pneumomediastinum. It should give us an idea of his anatomy, gastric function, and the presence or absence of any gross pathology such as ulcers, tumors, gastric outlet obstruction, etc. However, I have not actually ordered the test,  pending input from the surgeons.  Florencia Reasonsobert V. Hawkins Seaman, M.D. [phone]

## 2013-07-29 NOTE — Progress Notes (Signed)
Gen. Surgery:  I then personally interviewed and examined this patient this morning. I agree with the assessment and treatment plan outlined by Mr. Levi Palmer, [place]. I discussed his care with Dr. Alphonzo Palmer this morning.  He remained stable.Leukocytosis has resolved.No fever or tachycardia. Abdomen soft. Subjectively tender but objectively no peritoneal signs.  Plan: Clear liquid diet today. For a barium upper GI next week. No indication for surgical intervention unless condition deteriorates.  Dr. Nydia Palmer is aware of this patient should a  thoracic surgical input be necessary.   Levi Palmer, M.D., [facility] General and Minimally invasive Surgery Breast and Colorectal Surgery Office:   [phone] Pager:   [phone]

## 2013-07-29 NOTE — Progress Notes (Signed)
ANTIBIOTIC CONSULT NOTE - FOLLOW UP  Pharmacy Consult for Zosyn Indication: Suspected esophageal perforation  No Known Allergies  Patient Measurements: Height: 5\' 7"  (170.2 cm) Weight: 134 lb 9.6 oz (61.054 kg) IBW/kg (Calculated) : 66.1  Vital Signs: Temp: 97.8 F (36.6 C) (01/23 0516) Temp src: Oral (01/23 0516) BP: 107/68 mmHg (01/23 0516) Pulse Rate: 60 (01/23 0516) Intake/Output from previous day: 01/22 0701 - 01/23 0700 In: 2376.7 [I.V.:2226.7; IV Piggyback:150] Out: 1975 [Urine:1975]  Labs:  Recent Labs  07/26/13 1531 07/27/13 0510 07/28/13 0419  WBC 21.2* 12.3* 8.1  HGB 14.4 10.4* 10.0*  PLT 417* 273 209  CREATININE 1.66* 0.99  --    Estimated Creatinine Clearance: 97.7 ml/min (by C-G formula based on Cr of 0.99).   Microbiology: 1/20 blood: NGTD   Anti-infectives   Start     Dose/Rate Route Frequency Ordered Stop   07/26/13 2359  piperacillin-tazobactam (ZOSYN) IVPB 3.375 g     3.375 g 12.5 mL/hr over 240 Minutes Intravenous Every 8 hours 07/26/13 2332        Assessment: 28 yo M admitted 1/20 with vomitting.  He was hospitalized in 2010 with a similar occurrence of vomiting and mediastinal air presents with a day long history of vomiting.  Pharmacy consulted to dose Zosyn for suspected esophageal perforation.  Day #4 Zosyn  Tmax: afebrile  WBCs: decreased to WNL  Renal: SCr improved to 1, CrCl ~ 100 ml/min CG  Blood cultures remain negative.   Goal of Therapy:  Appropriate abx dosing, eradication of infection.   Plan:   Continue Zosyn 3.375g IV Q8H infused over 4hrs.  Renal function and dosing have been stable.  Pharmacy to s/o - but will follow peripherally and adjust dosing as needed if renal function changes.  Lynann Beaverhristine Tynika Luddy PharmD, BCPS Pager [phone] 07/29/2013 2:57 PM

## 2013-07-29 NOTE — Progress Notes (Signed)
Afebrile. Tolerating clear liquids. Feels better. No new labs.  Slight upper abdominal tenderness to palpation, not fully reproducible.  Recommendation:  I would recommend holding off on his upper GI series until Monday. There is no urgency to obtaining that study, rather, I think it would simply be good to obtain prior to his discharge since his recurrent abdominal symptoms are unexplained. The advantage of waiting until Monday is that it is difficult to get fluoroscopy studies performed on the weekend, plus, it will help ensure no leakage of barium into the mediastinum in the event that a tiny residual leak is still present (doubt).  Advancement to solid food whenever general surgery feels it is appropriate.  Florencia Reasonsobert V. Margurite Duffy, M.D. [phone]

## 2013-07-29 NOTE — Progress Notes (Signed)
[facility] PROGRESS NOTE  Levi BlanksMichael [name] WJX:[id number] DOB: Apr 22, 1986 DOA: 07/26/2013 PCP: No PCP Per Patient  Assessment/Plan:  Principal Problem:   Pneumomediastinum/suspected Esophageal perforation - GI and surgery on board and we appreciate their input. I will defer further management to them at this juncture. Currently recommending clear liquids with upper GI series on Monday. No indication for surgery at this time. -continue MIVF, PPI per GI, pain medication: dilaudid PRN. - Zosyn on board  Active Problems:     AKI (acute kidney injury) - resolved with fluids and supportive therapy.  Anxiety -Pt reports anxiety related to condition and being in hospital -Consult psychiatry for Anxiety/depressive symptoms.  DVT prevention -Heparin SQ  Code Status: full Family Communication: None at bedside Disposition Plan: Once cleared from surgery and/or GI perspective, awaiting GI series scheduled for Monday.   Consultants:  Gastroenterology  General surgery  Procedures:  Barium swallow  Antibiotics:  Zosyn (day 4)  HPI/Subjective: Patient denies any recent marijuana use. No new physical complaints today. Pt reports anxiety related to condition and being in the hospital and he requested to speak with psychiatry.   Objective: Filed Vitals:   07/29/13 0516  BP: 107/68  Pulse: 60  Temp: 97.8 F (36.6 C)  Resp: 16    Intake/Output Summary (Last 24 hours) at 07/29/13 1515 Last data filed at 07/29/13 0516  Gross per 24 hour  Intake 2326.67 ml  Output   1550 ml  Net 776.67 ml   Filed Weights   07/27/13 0524 07/28/13 0352 07/29/13 0636  Weight: 60.827 kg (134 lb 1.6 oz) 62.4 kg (137 lb 9.1 oz) 61.054 kg (134 lb 9.6 oz)    Exam:   General:  Pt in nad, alert and awake, mild anxiety and frustration voiced by patient  Cardiovascular: rrr, no mrg  Respiratory: CTA BL, no wheezes  Abdomen: soft, NT, ND  Musculoskeletal: no cyanosis, edema,  or clubbing.   Data Reviewed: Basic Metabolic Panel:  Recent Labs Lab 07/26/13 1531 07/27/13 0510  NA 140 142  K 4.5 4.1  CL 97 105  CO2 15* 21  GLUCOSE 141* 92  BUN 16 16  CREATININE 1.66* 0.99  CALCIUM 11.4* 8.5   Liver Function Tests:  Recent Labs Lab 07/26/13 1531 07/27/13 0510  AST 19 15  ALT 12 7  ALKPHOS 100 63  BILITOT 0.4 0.3  PROT 10.0* 6.7  ALBUMIN 5.4* 3.5    Recent Labs Lab 07/26/13 1531  LIPASE 13   No results found for this basename: AMMONIA,  in the last 168 hours CBC:  Recent Labs Lab 07/26/13 1531 07/27/13 0510 07/28/13 0419  WBC 21.2* 12.3* 8.1  NEUTROABS 19.0*  --   --   HGB 14.4 10.4* 10.0*  HCT 42.1 31.5* 32.0*  MCV 70.2* 71.4* 73.6*  PLT 417* 273 209   Cardiac Enzymes: No results found for this basename: CKTOTAL, CKMB, CKMBINDEX, TROPONINI,  in the last 168 hours BNP (last 3 results)  Recent Labs  06/13/13 1444  PROBNP 21.1   CBG: No results found for this basename: GLUCAP,  in the last 168 hours  Recent Results (from the past 240 hour(s))  CULTURE, BLOOD (ROUTINE X 2)     Status: None   Collection Time    07/26/13 12:15 AM      Result Value Range Status   Specimen Description BLOOD LEFT ARM   Final   Special Requests BOTTLES DRAWN AEROBIC AND ANAEROBIC 5ML   Final   Culture  Setup Time     Final   Value: 07/27/2013 03:26     Performed at [facility]   Culture     Final   Value:        BLOOD CULTURE RECEIVED NO GROWTH TO DATE CULTURE WILL BE HELD FOR 5 DAYS BEFORE ISSUING A FINAL NEGATIVE REPORT     Performed at [facility]   Report Status PENDING   Incomplete  CULTURE, BLOOD (ROUTINE X 2)     Status: None   Collection Time    07/26/13 12:20 AM      Result Value Range Status   Specimen Description BLOOD RIGHT HAND   Final   Special Requests BOTTLES DRAWN AEROBIC AND ANAEROBIC   Final   Culture  Setup Time     Final   Value: 07/27/2013 03:26     Performed at [facility]    Culture     Final   Value:        BLOOD CULTURE RECEIVED NO GROWTH TO DATE CULTURE WILL BE HELD FOR 5 DAYS BEFORE ISSUING A FINAL NEGATIVE REPORT     Performed at [facility]   Report Status PENDING   Incomplete     Studies: No results found.  Scheduled Meds: . heparin  5,000 Units Subcutaneous Q8H  . pantoprazole (PROTONIX) IV  40 mg Intravenous Q12H  . piperacillin-tazobactam (ZOSYN)  IV  3.375 g Intravenous Q8H  . sodium chloride  3 mL Intravenous Q12H   Continuous Infusions: . dextrose 5 % and 0.45 % NaCl with KCl 20 mEq/L 100 mL/hr at 07/29/13 1355    Time spent: > 35 minutes    Parke Simmers  [facility] Pager [phone]. If 7PM-7AM, please contact night-coverage at [email or web address], password [facility] 07/29/2013, 3:15 PM  LOS: 3 days

## 2013-07-29 NOTE — Progress Notes (Signed)
  Subjective: Major complaint is his stomach hurts.  No SOB, had a BM last PM  Objective: Vital signs in last 24 hours: Temp:  [97.4 F (36.3 C)-98 F (36.7 C)] 97.8 F (36.6 C) (01/23 0516) Pulse Rate:  [59-68] 60 (01/23 0516) Resp:  [16-18] 16 (01/23 0516) BP: (107-124)/(63-76) 107/68 mmHg (01/23 0516) SpO2:  [97 %-100 %] 100 % (01/23 0516) Weight:  [61.054 kg (134 lb 9.6 oz)] 61.054 kg (134 lb 9.6 oz) (01/23 0636) Last BM Date: 07/26/13 NPO Afebrile, VSS Labs yesterday show WBC is normal Intake/Output from previous day: 01/22 0701 - 01/23 0700 In: 2376.7 [I.V.:2226.7; IV Piggyback:150] Out: 1975 [Urine:1975] Intake/Output this shift:    General appearance: cooperative, no distress and just waking up, two friends in room sleeping also Resp: clear to auscultation bilaterally GI: soft, no distension, non tender. +BS  Lab Results:   Recent Labs  07/27/13 0510 07/28/13 0419  WBC 12.3* 8.1  HGB 10.4* 10.0*  HCT 31.5* 32.0*  PLT 273 209    BMET  Recent Labs  07/26/13 1531 07/27/13 0510  NA 140 142  K 4.5 4.1  CL 97 105  CO2 15* 21  GLUCOSE 141* 92  BUN 16 16  CREATININE 1.66* 0.99  CALCIUM 11.4* 8.5   PT/INR  Recent Labs  07/27/13 0510  LABPROT 15.1  INR 1.22     Recent Labs Lab 07/26/13 1531 07/27/13 0510  AST 19 15  ALT 12 7  ALKPHOS 100 63  BILITOT 0.4 0.3  PROT 10.0* 6.7  ALBUMIN 5.4* 3.5     Lipase     Component Value Date/Time   LIPASE 13 07/26/2013 1531     Studies/Results: Dg Esophagus W/water Sol Cm  07/27/2013   CLINICAL DATA:  Pneumomediastinum. Evaluate for esophageal perforation.  EXAM: ESOPHOGRAM/BARIUM SWALLOW  TECHNIQUE: Single contrast examination was performed using  water-soluble.  COMPARISON:  DG CHEST 2 VIEW dated 07/27/2013; CT CHEST W/O CM dated 07/26/2013  FLUOROSCOPY TIME:  0 min 33 seconds.  FINDINGS: The patient drank Omnipaque 300. No evidence of an esophageal or gastroesophageal junction leak.  IMPRESSION:  No leak.   Electronically Signed   By: Leanna BattlesMelinda  Blietz M.D.   On: 07/27/2013 09:06    Medications: . heparin  5,000 Units Subcutaneous Q8H  . pantoprazole (PROTONIX) IV  40 mg Intravenous Q12H  . piperacillin-tazobactam (ZOSYN)  IV  3.375 g Intravenous Q8H  . sodium chloride  3 mL Intravenous Q12H    Assessment/Plan 1. Nausea, vomiting and Pneumomediastinum  2. Pt has hx of nausea and vomiting daily since 2010, got better this summer on moving to [place], but has started again with weekly episodes of nausea and vomiting.  3. Weight loss   Plan:  Start clears this AM and then he can have GI series tomorrow per Dr. Matthias HughsBuccini.  LOS: 3 days    Tashauna Caisse 07/29/2013

## 2013-07-29 NOTE — Progress Notes (Addendum)
Spoke with pt concerning [facility] for [facility]CP. Appointment upset for 2/13 at 11 AM for PCP, 2/16 at 12 noon [facility] [employer]. Information was given to pt. [place]. Pt do not want to use program at present time. [employer]. Pt states that if meds are on $4 list he will try to purchase. Will follow up for medication assistant.

## 2013-07-30 ENCOUNTER — Encounter (HOSPITAL_COMMUNITY): Payer: Self-pay | Admitting: Psychiatry

## 2013-07-30 ENCOUNTER — Inpatient Hospital Stay (HOSPITAL_COMMUNITY): Payer: Self-pay

## 2013-07-30 DIAGNOSIS — F419 Anxiety disorder, unspecified: Secondary | ICD-10-CM | POA: Diagnosis present

## 2013-07-30 DIAGNOSIS — F32A Depression, unspecified: Secondary | ICD-10-CM | POA: Diagnosis present

## 2013-07-30 DIAGNOSIS — F411 Generalized anxiety disorder: Secondary | ICD-10-CM

## 2013-07-30 DIAGNOSIS — F329 Major depressive disorder, single episode, unspecified: Secondary | ICD-10-CM | POA: Diagnosis present

## 2013-07-30 MED ORDER — HYDROXYZINE HCL 25 MG PO TABS
25.0000 mg | ORAL_TABLET | Freq: Four times a day (QID) | ORAL | Status: DC | PRN
  Administered 2013-07-30 – 2013-08-02 (×2): 25 mg via ORAL
  Filled 2013-07-30 (×3): qty 1

## 2013-07-30 MED ORDER — SERTRALINE HCL 50 MG PO TABS
50.0000 mg | ORAL_TABLET | Freq: Every day | ORAL | Status: DC
  Administered 2013-07-30 – 2013-07-31 (×2): 50 mg via ORAL
  Filled 2013-07-30 (×2): qty 1

## 2013-07-30 MED ORDER — ZOLPIDEM TARTRATE 5 MG PO TABS
5.0000 mg | ORAL_TABLET | Freq: Once | ORAL | Status: AC
  Administered 2013-07-30: 5 mg via ORAL
  Filled 2013-07-30: qty 1

## 2013-07-30 MED ORDER — DIPHENHYDRAMINE HCL 25 MG PO CAPS
25.0000 mg | ORAL_CAPSULE | Freq: Once | ORAL | Status: AC
  Administered 2013-07-30: 25 mg via ORAL
  Filled 2013-07-30: qty 1

## 2013-07-30 MED ORDER — HYDROMORPHONE HCL PF 1 MG/ML IJ SOLN
0.5000 mg | Freq: Once | INTRAMUSCULAR | Status: AC
  Administered 2013-07-30: 0.5 mg via INTRAVENOUS
  Filled 2013-07-30: qty 1

## 2013-07-30 NOTE — Progress Notes (Signed)
  Subjective: Major complaint is his stomach hurts.  No SOB,no chest pain  Objective: Vital signs in last 24 hours: Temp:  [97.8 F (36.6 C)-98.7 F (37.1 C)] 97.8 F (36.6 C) (01/24 0431) Pulse Rate:  [62-92] 62 (01/24 0431) Resp:  [18] 18 (01/24 0431) BP: (126-139)/(81-89) 127/81 mmHg (01/24 0431) SpO2:  [99 %-100 %] 100 % (01/24 0431) Weight:  [133 lb 13.1 oz (60.7 kg)] 133 lb 13.1 oz (60.7 kg) (01/24 0431) Last BM Date: 07/30/13 NPO Afebrile, VSS WBC is normal Intake/Output from previous day: 01/23 0701 - 01/24 0700 In: 3896.7 [P.O.:1160; I.V.:2586.7; IV Piggyback:150] Out: 1200 [Urine:1200] Intake/Output this shift: Total I/O In: -  Out: 1000 [Urine:1000]  General appearance: cooperative, no distress and just waking up, two friends in room sleeping also Resp: clear to auscultation bilaterally GI: soft, no distension, non tender. +BS  Lab Results:   Recent Labs  07/28/13 0419  WBC 8.1  HGB 10.0*  HCT 32.0*  PLT 209    BMET No results found for this basename: NA, K, CL, CO2, GLUCOSE, BUN, CREATININE, CALCIUM,  in the last 72 hours PT/INR No results found for this basename: LABPROT, INR,  in the last 72 hours   Recent Labs Lab 07/26/13 1531 07/27/13 0510  AST 19 15  ALT 12 7  ALKPHOS 100 63  BILITOT 0.4 0.3  PROT 10.0* 6.7  ALBUMIN 5.4* 3.5     Lipase     Component Value Date/Time   LIPASE 13 07/26/2013 1531     Studies/Results: Dg Chest 2 View  07/30/2013   CLINICAL DATA:  Right lower chest pain and right upper abdomen pain. Pneumomediastinum.  EXAM: CHEST  2 VIEW  COMPARISON:  Chest x-ray dated 07/27/2013 and chest CT dated 07/26/2013  FINDINGS: Pneumomediastinum has almost completely resolved. Heart size and vascularity are normal and the lungs are clear. No pneumothorax. No osseous abnormality.  IMPRESSION: Minimal residual pneumomediastinum.   Electronically Signed   By: Geanie CooleyJim  Maxwell M.D.   On: 07/30/2013 08:56    Medications: . heparin   5,000 Units Subcutaneous Q8H  . pantoprazole (PROTONIX) IV  40 mg Intravenous Q12H  . piperacillin-tazobactam (ZOSYN)  IV  3.375 g Intravenous Q8H  . sodium chloride  3 mL Intravenous Q12H    Assessment/Plan 1. Nausea, vomiting and Pneumomediastinum  2. Pt has hx of nausea and vomiting daily since 2010: Refuses to see GI on outpatient basis due to cost: Social work consulted 3. Weight loss   Plan:  Cont clears through the weekend.  Barium study on Mon.  If study is normal can advance diet.  Needs GI work up as an outpatient.   No surgical issues at this time.  Will sign off.  If any concerns about esophagus or study, please consult thoracic.  Does not need general surgery follow up.    LOS: 4 days    Maurizio Geno C. 07/30/2013

## 2013-07-30 NOTE — Progress Notes (Signed)
Patient very upset this morning when assessed. Pt stated that the surgeons this morning were both rude to him and he does not wish to see them again. He also states that he has no money and will not be able to be seen out patient and needs to stay until he is medically stable. Pt began crying and wanted to see the charge nurse. Paged Dr. Cena BentonVega and let him know about the situation. Charge nurse, Joaquin MusicEmily Harless currently in patients room. Will continue to monitor.  Forestine ChuteBurns, [place]

## 2013-07-30 NOTE — Consult Note (Signed)
[facility]   Reason for Consult:  Anxiety Referring Physician:  Dr. Vernell MorgansVega  Levi Palmer is an 28 y.o. male.  Assessment: AXIS I:  Anxiety Disorder NOS and Major Depression, single episode AXIS II:  Deferred AXIS III:   Past Medical History  Diagnosis Date  . Medical history non-contributory    AXIS IV:  economic problems, occupational problems, other psychosocial or environmental problems, problems related to social environment and problems with primary support group AXIS V:  41-50 serious symptoms  Plan:  Recommend psychiatric Inpatient admission when medically cleared.--[facility]   Dr. Lucianne MussKumar reviewed the patient and concurs with treatment plan with the addition of Vistaril 25 mg every six hours PRN anxiety and Zoloft 50 mg daily for depression.  Subjective:   Levi Palmer is a 28 y.o. male patient admitted with depression and suicidal ideations and homicidal ideations.  HPI:  Patient is frustrated with his current medical issues and nausea/vomiting for the past five years.  He is also experiencing multiple stressors--girlfriend issues, housing issues, decreased finances, occupation problems.  He is tearful on assessment and states he is on the edge of hurting himself or others.  Levi Palmer wants help and [facility] and anxiety once he is medically stable. HPI Elements:   Location:  generalized. Quality:  acute. Severity:  severe. Timing:  few months. Duration:  constant. Context:  stressors.  Past Psychiatric History: Past Medical History  Diagnosis Date  . Medical history non-contributory     reports that he has never smoked. He has never used smokeless tobacco. He reports that he uses illicit drugs (Marijuana). He reports that he does not drink alcohol. History reviewed. No pertinent family history.   Living Arrangements:  (girlfriend and friend)   Abuse/Neglect  [facility] Physical Abuse: Denies Verbal Abuse: Denies Sexual Abuse: Denies Allergies:  No Known Allergies   Objective: Blood pressure 117/71, pulse 65, temperature 98.1 F (36.7 C), temperature source Oral, resp. rate 18, height 5\' 7"  (1.702 m), weight 133 lb 13.1 oz (60.7 kg), SpO2 99.00%.Body mass index is 20.95 kg/(m^2). Results for orders placed during the hospital encounter of 07/26/13 (from the past 72 hour(s))  CBC     Status: Abnormal   Collection Time    07/28/13  4:19 AM      Result Value Range   WBC 8.1  4.0 - 10.5 K/uL   RBC 4.35  4.22 - 5.81 MIL/uL   Hemoglobin 10.0 (*) 13.0 - 17.0 g/dL   HCT 08.632.0 (*) 57.839.0 - 46.952.0 %   MCV 73.6 (*) 78.0 - 100.0 fL   MCH 23.0 (*) 26.0 - 34.0 pg   MCHC 31.3  30.0 - 36.0 g/dL   RDW 62.916.3 (*) 52.811.5 - 41.315.5 %   Platelets 209  150 - 400 K/uL   Comment: DELTA CHECK NOTED     REPEATED TO VERIFY     SPECIMEN CHECKED FOR CLOTS   Labs are reviewed and are pertinent for medical issues currently being treated.  Current Facility-Administered Medications  Medication Dose Route Frequency Provider Last Rate Last Dose  . dextrose 5 % and 0.45 % NaCl with KCl 20 mEq/L infusion   Intravenous Continuous Penny Piarlando Vega, MD 100 mL/hr at 07/30/13 0744    . heparin injection 5,000 Units  5,000 Units Subcutaneous Q8H Lynden OxfordPranav Patel, MD   5,000 Units at 07/30/13 1321  . HYDROmorphone (DILAUDID) injection 1 mg  1 mg  Intravenous Q3H PRN Penny Pia, MD   1 mg at 07/30/13 1609  . ondansetron (ZOFRAN) tablet 4 mg  4 mg Oral Q6H PRN Lynden Oxford, MD       Or  . ondansetron (ZOFRAN) injection 4 mg  4 mg Intravenous Q6H PRN Lynden Oxford, MD   4 mg at 07/28/13 1453  . pantoprazole (PROTONIX) injection 40 mg  40 mg Intravenous Q12H Lynden Oxford, MD   40 mg at 07/30/13 0915  . piperacillin-tazobactam (ZOSYN) IVPB 3.375 g  3.375 g Intravenous [address] RPH   3.375 g at 07/30/13 1615  . promethazine (PHENERGAN) injection 12.5 mg  12.5 mg Intravenous Q6H PRN Rolan Lipa, NP   12.5 mg at 07/30/13 1328  . sodium chloride 0.9 % injection 3 mL  3 mL Intravenous Q12H Lynden Oxford, MD   3 mL at 07/30/13 0956    Psychiatric Specialty Exam:     Blood pressure 117/71, pulse 65, temperature 98.1 F (36.7 C), temperature source Oral, resp. rate 18, height 5\' 7"  (1.702 m), weight 133 lb 13.1 oz (60.7 kg), SpO2 99.00%.Body mass index is 20.95 kg/(m^2).  General Appearance: Disheveled  Eye Contact::  Minimal  Speech:  Normal Rate  Volume:  Normal  Mood:  Anxious, Depressed and Irritable hopeless  Affect:  Depressed  Thought Process:  Coherent  Orientation:  Full (Time, Place, and Person)  Thought Content:  Rumination  Suicidal Thoughts:  Yes.  with intent/plan--contracts for safety during hospitalization  Homicidal Thoughts:  Yes.  without intent/plan  Memory:  Immediate;   Fair Recent;   Fair Remote;   Fair  Judgement:  Fair  Insight:  Fair  Psychomotor Activity:  Decreased  Concentration:  Fair  Recall:  Fair  Akathisia:  No  Handed:  Right  AIMS (if indicated):     Assets:  Resilience  Sleep:      Treatment Plan Summary: Daily contact with patient to assess and evaluate symptoms and progress in treatment Medication management--Vistaril 25 mg every six hours PRN anxiety and Zoloft 50 mg daily for depression.  Admit to [facility] hall once he is medically stable.  Nanine Means, PMH-NP 07/30/2013 4:42 PM

## 2013-07-30 NOTE — Progress Notes (Signed)
[facility] PROGRESS NOTE  Levi Palmer JYN:[id number] DOB: 12-18-1985 DOA: 07/26/2013 PCP: No PCP Per Patient  Assessment/Plan:  Principal Problem:   Pneumomediastinum/suspected Esophageal perforation - GI and surgery on board and we appreciate their input. I will defer further management to them at this juncture. GI to decide which test to perform this Monday and when to advance diet. -continue MIVF, PPI per GI, pain medication: dilaudid PRN. - Zosyn on board  Active Problems:     AKI (acute kidney injury) - resolved with fluids and supportive therapy.  Anxiety/HI/Depression - Pt reports anxiety related to condition and being in hospital - Consult psychiatry for Anxiety/depressive symptoms. - Patient with HI and depression. Plans are to transition to Behavioral health once medically cleared.  DVT prevention -Heparin SQ  Code Status: full Family Communication: None at bedside Disposition Plan: Once cleared from  GI perspective, awaiting GI series scheduled for Monday.   Consultants:  Gastroenterology  General surgery  Procedures:  Barium swallow  Antibiotics:  Zosyn (day 4)  HPI/Subjective: Per my discussion with Psychiatry patient has been depressed and has had thoughts of hurting someone.  Patient has been anxious because of disease burden.  Otherwise has no other complaints.  Objective: Filed Vitals:   07/30/13 1404  BP: 117/71  Pulse: 65  Temp: 98.1 F (36.7 C)  Resp: 18    Intake/Output Summary (Last 24 hours) at 07/30/13 1643 Last data filed at 07/30/13 1500  Gross per 24 hour  Intake 4866.67 ml  Output   1600 ml  Net 3266.67 ml   Filed Weights   07/28/13 0352 07/29/13 0636 07/30/13 0431  Weight: 62.4 kg (137 lb 9.1 oz) 61.054 kg (134 lb 9.6 oz) 60.7 kg (133 lb 13.1 oz)    Exam:   General:  Pt in nad, alert and awake,   Cardiovascular: rrr, no mrg  Respiratory: CTA BL, no wheezes  Abdomen: soft, NT,  ND  Musculoskeletal: no cyanosis, edema, or clubbing.   Data Reviewed: Basic Metabolic Panel:  Recent Labs Lab 07/26/13 1531 07/27/13 0510  NA 140 142  K 4.5 4.1  CL 97 105  CO2 15* 21  GLUCOSE 141* 92  BUN 16 16  CREATININE 1.66* 0.99  CALCIUM 11.4* 8.5   Liver Function Tests:  Recent Labs Lab 07/26/13 1531 07/27/13 0510  AST 19 15  ALT 12 7  ALKPHOS 100 63  BILITOT 0.4 0.3  PROT 10.0* 6.7  ALBUMIN 5.4* 3.5    Recent Labs Lab 07/26/13 1531  LIPASE 13   No results found for this basename: AMMONIA,  in the last 168 hours CBC:  Recent Labs Lab 07/26/13 1531 07/27/13 0510 07/28/13 0419  WBC 21.2* 12.3* 8.1  NEUTROABS 19.0*  --   --   HGB 14.4 10.4* 10.0*  HCT 42.1 31.5* 32.0*  MCV 70.2* 71.4* 73.6*  PLT 417* 273 209   Cardiac Enzymes: No results found for this basename: CKTOTAL, CKMB, CKMBINDEX, TROPONINI,  in the last 168 hours BNP (last 3 results)  Recent Labs  06/13/13 1444  PROBNP 21.1   CBG: No results found for this basename: GLUCAP,  in the last 168 hours  Recent Results (from the past 240 hour(s))  CULTURE, BLOOD (ROUTINE X 2)     Status: None   Collection Time    07/26/13 12:15 AM      Result Value Range Status   Specimen Description BLOOD LEFT ARM   Final   Special Requests BOTTLES DRAWN AEROBIC AND  ANAEROBIC 5ML   Final   Culture  Setup Time     Final   Value: 07/27/2013 03:26     [facility]formed [facility]   Culture     Final   Value:        BLOOD CULTURE RECEIVED NO GROWTH TO DATE CULTURE WILL BE HELD FOR 5 DAYS BEFORE ISSUING A FINAL NEGATIVE REPORT     Performed at [facility]   Report Status PENDING   Incomplete  CULTURE, BLOOD (ROUTINE X 2)     Status: None   Collection Time    07/26/13 12:20 AM      Result Value Range Status   Specimen Description BLOOD RIGHT HAND   Final   Special Requests BOTTLES DRAWN AEROBIC AND ANAEROBIC 5ML   Final   Culture  Setup Time     Final   Value: 07/27/2013 03:26      Performed at [facility]   Culture     Final   Value:        BLOOD CULTURE RECEIVED NO GROWTH TO DATE CULTURE WILL BE HELD FOR 5 DAYS BEFORE ISSUING A FINAL NEGATIVE REPORT     Performed at [facility]   Report Status PENDING   Incomplete     Studies: Dg Chest 2 View  07/30/2013   CLINICAL DATA:  Right lower chest pain and right upper abdomen pain. Pneumomediastinum.  EXAM: CHEST  2 VIEW  COMPARISON:  Chest x-ray dated 07/27/2013 and chest CT dated 07/26/2013  FINDINGS: Pneumomediastinum has almost completely resolved. Heart size and vascularity are normal and the lungs are clear. No pneumothorax. No osseous abnormality.  IMPRESSION: Minimal residual pneumomediastinum.   Electronically Signed   By: Geanie CooleyJim  Maxwell M.D.   On: 07/30/2013 08:56    Scheduled Meds: . heparin  5,000 Units Subcutaneous Q8H  . pantoprazole (PROTONIX) IV  40 mg Intravenous Q12H  . piperacillin-tazobactam (ZOSYN)  IV  3.375 g Intravenous Q8H  . sodium chloride  3 mL Intravenous Q12H   Continuous Infusions: . dextrose 5 % and 0.45 % NaCl with KCl 20 mEq/L 100 mL/hr at 07/30/13 0744    Time spent: > 35 minutes    Levi Palmer, Levi Palmer  [facility] Pager [phone]. If 7PM-7AM, please contact night-coverage at [email or web address], password [email or web address] 07/30/2013, 4:43 PM  LOS: 4 days

## 2013-07-30 NOTE — Progress Notes (Signed)
Levi Palmer 12:20 PM  Subjective: Patient is a mad and probably in the worst mood I've seen of any inpatient and I did try to discuss his history and his computer was reviewed and I tried to listen to his story and it sounds like his symptoms come and go and he takes no responsibility for doing anything but has no new complaints and his case was discussed with my partner as well  Objective: Vital signs stable abdomen is sore to the touch without guarding or rebound no new labs chest x-ray improved   Assessment: Nausea vomiting abdominal pain weight loss with probable self-induced esophageal perforation  Plan: Patient needs psychological help and probable medicines as well as social services for possible medicaid and I will review his chart and see if any other tests will be beneficial but possibly a gastric emptying study at some point  [facility],Lacorey Brusca E

## 2013-07-30 NOTE — Progress Notes (Signed)
Patient in very mad mood and stated that he does not think he received last dose of dilaudid because he didn't "feel" it go in. Page Dr. Cena BentonVega. Will continue to monitor.  Forestine ChuteBurns, Levi Palmer

## 2013-07-31 DIAGNOSIS — I4729 Other ventricular tachycardia: Secondary | ICD-10-CM

## 2013-07-31 DIAGNOSIS — F3289 Other specified depressive episodes: Secondary | ICD-10-CM

## 2013-07-31 DIAGNOSIS — I472 Ventricular tachycardia: Secondary | ICD-10-CM | POA: Diagnosis not present

## 2013-07-31 DIAGNOSIS — F329 Major depressive disorder, single episode, unspecified: Secondary | ICD-10-CM

## 2013-07-31 DIAGNOSIS — R072 Precordial pain: Secondary | ICD-10-CM

## 2013-07-31 LAB — BASIC METABOLIC PANEL
BUN: 3 mg/dL — ABNORMAL LOW (ref 6–23)
CO2: 26 mEq/L (ref 19–32)
CREATININE: 1.1 mg/dL (ref 0.50–1.35)
Calcium: 9.2 mg/dL (ref 8.4–10.5)
Chloride: 104 mEq/L (ref 96–112)
GLUCOSE: 100 mg/dL — AB (ref 70–99)
Potassium: 4.1 mEq/L (ref 3.7–5.3)
Sodium: 142 mEq/L (ref 137–147)

## 2013-07-31 LAB — PHOSPHORUS: Phosphorus: 3.4 mg/dL (ref 2.3–4.6)

## 2013-07-31 LAB — TROPONIN I: Troponin I: <0.3 ng/mL (ref <0.30)

## 2013-07-31 LAB — MAGNESIUM: Magnesium: 2 mg/dL (ref 1.5–2.5)

## 2013-07-31 LAB — TSH: TSH: 0.681 u[IU]/mL (ref 0.350–4.500)

## 2013-07-31 MED ORDER — CHLORPROMAZINE HCL 10 MG PO TABS
10.0000 mg | ORAL_TABLET | Freq: Once | ORAL | Status: AC
  Administered 2013-08-01: 10 mg via ORAL
  Filled 2013-07-31: qty 1

## 2013-07-31 MED ORDER — LORAZEPAM 2 MG/ML IJ SOLN
0.5000 mg | INTRAMUSCULAR | Status: DC | PRN
  Administered 2013-07-31 – 2013-08-01 (×6): 0.5 mg via INTRAVENOUS
  Filled 2013-07-31 (×6): qty 1

## 2013-07-31 MED ORDER — HYDROMORPHONE HCL PF 1 MG/ML IJ SOLN
0.5000 mg | Freq: Once | INTRAMUSCULAR | Status: AC
Start: 2013-07-31 — End: 2013-07-31
  Administered 2013-07-31: 0.5 mg via INTRAVENOUS
  Filled 2013-07-31: qty 1

## 2013-07-31 NOTE — Progress Notes (Signed)
  Echocardiogram 2D Echocardiogram has been performed.  Arvil ChacoFoster, Jayelle Page 07/31/2013, 2:16 PM

## 2013-07-31 NOTE — Progress Notes (Signed)
[facility] PROGRESS NOTE  Levi BlanksMichael [name] NWG:[id number] DOB: 06-Sep-1985 DOA: 07/26/2013 PCP: No PCP Per Patient  Assessment/Plan:  Principal Problem:   Pneumomediastinum/suspected Esophageal perforation - GI and surgery on board and we appreciate their input. I will defer further management to them at this juncture. GI to decide which test to perform this Monday and when to advance diet. - continue MIVF, PPI per GI, pain medication: dilaudid PRN. - Zosyn on board  None sustained V tach -  Telemetry reviewed. - Will obtain BMP, magnesium, phosphorus, TSH - EKG 12 lead ordered and reviewed. Showing normal sinus rhythm with no ST elevation or depression.  - Patient was recently started on zoloft and had - Echocardiogram - Consulted Cardiology  Active Problems:     AKI (acute kidney injury) - resolved with fluids and supportive therapy.  Anxiety/HI/Depression - Pt reports anxiety related to condition and being in hospital - Consulted psychiatry for Anxiety/depressive symptoms. - Patient with HI, SI, and depression. Plans are to transition to Behavioral health once medically cleared.   DVT prevention -Heparin SQ  Code Status: full Family Communication: None at bedside Disposition Plan: Once cleared from  GI perspective, awaiting GI series scheduled for Monday.   Consultants:  Gastroenterology  General surgery  Procedures:  Barium swallow  Antibiotics:  Zosyn (day 4)  HPI/Subjective: Per my discussion with Psychiatry patient has been depressed and has had thoughts of hurting someone.  Patient has been anxious because of disease burden.  Otherwise has no other complaints.  Objective: Filed Vitals:   07/31/13 0545  BP: 141/88  Pulse: 71  Temp: 98.1 F (36.7 C)  Resp: 18    Intake/Output Summary (Last 24 hours) at 07/31/13 1119 Last data filed at 07/31/13 [id number]  Gross per 24 hour  Intake   2190 ml  Output   1000 ml  Net   1190 ml    Filed Weights   07/29/13 0636 07/30/13 0431 07/31/13 0545  Weight: 61.054 kg (134 lb 9.6 oz) 60.7 kg (133 lb 13.1 oz) 60.3 kg (132 lb 15 oz)    Exam:   General:  Pt in nad, alert and awake,   Cardiovascular: rrr, no mrg  Respiratory: CTA BL, no wheezes  Abdomen: soft, NT, ND  Musculoskeletal: no cyanosis, edema, or clubbing.   Data Reviewed: Basic Metabolic Panel:  Recent Labs Lab 07/26/13 1531 07/27/13 0510  NA 140 142  K 4.5 4.1  CL 97 105  CO2 15* 21  GLUCOSE 141* 92  BUN 16 16  CREATININE 1.66* 0.99  CALCIUM 11.4* 8.5   Liver Function Tests:  Recent Labs Lab 07/26/13 1531 07/27/13 0510  AST 19 15  ALT 12 7  ALKPHOS 100 63  BILITOT 0.4 0.3  PROT 10.0* 6.7  ALBUMIN 5.4* 3.5    Recent Labs Lab 07/26/13 1531  LIPASE 13   No results found for this basename: AMMONIA,  in the last 168 hours CBC:  Recent Labs Lab 07/26/13 1531 07/27/13 0510 07/28/13 0419  WBC 21.2* 12.3* 8.1  NEUTROABS 19.0*  --   --   HGB 14.4 10.4* 10.0*  HCT 42.1 31.5* 32.0*  MCV 70.2* 71.4* 73.6*  PLT 417* 273 209   Cardiac Enzymes: No results found for this basename: CKTOTAL, CKMB, CKMBINDEX, TROPONINI,  in the last 168 hours BNP (last 3 results)  Recent Labs  06/13/13 1444  PROBNP 21.1   CBG: No results found for this basename: GLUCAP,  in the last 168 hours  Recent  Results (from the past 240 hour(s))  CULTURE, BLOOD (ROUTINE X 2)     Status: None   Collection Time    07/26/13 12:15 AM      Result Value Range Status   Specimen Description BLOOD LEFT ARM   Final   Special Requests BOTTLES DRAWN AEROBIC AND ANAEROBIC   Final   Culture  Setup Time     Final   Value: 07/27/2013 03:26     Performed at [facility]   Culture     Final   Value:        BLOOD CULTURE RECEIVED NO GROWTH TO DATE CULTURE WILL BE HELD FOR 5 DAYS BEFORE ISSUING A FINAL NEGATIVE REPORT     Performed at [facility]   Report Status PENDING   Incomplete  CULTURE,  BLOOD (ROUTINE X 2)     Status: None   Collection Time    07/26/13 12:20 AM      Result Value Range Status   Specimen Description BLOOD RIGHT HAND   Final   Special Requests BOTTLES DRAWN AEROBIC AND ANAEROBIC   Final   Culture  Setup Time     Final   Value: 07/27/2013 03:26     Performed at [facility]   Culture     Final   Value:        BLOOD CULTURE RECEIVED NO GROWTH TO DATE CULTURE WILL BE HELD FOR 5 DAYS BEFORE ISSUING A FINAL NEGATIVE REPORT     Performed at [facility]   Report Status PENDING   Incomplete     Studies: Dg Chest 2 View  07/30/2013   CLINICAL DATA:  Right lower chest pain and right upper abdomen pain. Pneumomediastinum.  EXAM: CHEST  2 VIEW  COMPARISON:  Chest x-ray dated 07/27/2013 and chest CT dated 07/26/2013  FINDINGS: Pneumomediastinum has almost completely resolved. Heart size and vascularity are normal and the lungs are clear. No pneumothorax. No osseous abnormality.  IMPRESSION: Minimal residual pneumomediastinum.   Electronically Signed   By: Geanie Cooley M.D.   On: 07/30/2013 08:56    Scheduled Meds: . heparin  5,000 Units Subcutaneous Q8H  . pantoprazole (PROTONIX) IV  40 mg Intravenous Q12H  . piperacillin-tazobactam (ZOSYN)  IV  3.375 g Intravenous Q8H  . sodium chloride  3 mL Intravenous Q12H   Continuous Infusions: . dextrose 5 % and 0.45 % NaCl with KCl 20 mEq/L 100 mL/hr at 07/30/13 0744    Time spent: > 35 minutes    Penny Pia  [facility] Pager [phone]. If 7PM-7AM, please contact night-coverage at [email or web address], password [facility] 07/31/2013, 11:19 AM  LOS: 5 days

## 2013-07-31 NOTE — Consult Note (Signed)
CARDIOLOGY CONSULT NOTE   Patient ID: Levi Palmer MRN: [id number], DOB/AGE: [birth year]   Admit date: 07/26/2013 Date of Consult: 07/31/2013   Primary Physician: No PCP Per Patient Primary Cardiologist: none  Pt. Profile  nsVT (6 beats)  Problem List  Past Medical History  Diagnosis Date  . Medical history non-contributory     Past Surgical History  Procedure Laterality Date  . Cholecystectomy    . Tonsillectomy    . Incise and drain abcess      Allergies  No Known Allergies  HPI   A 28 year old male with h/o was admitted for acute pneumomediastinum with suspected Esophageal perforation on 07/26/2013. The etiology is unclear. The patient has been treated for significant pain and depression and was given dilaudid, phenergan and zoloft (started yesterday). Today he had a 18 beats of nsVT. The patient felt palpitations and SOB at the time, no CP, no dizziness. He denies any prior palpitations. He doesn't know his family history and is not aware of SCD in his family.  Inpatient Medications  . heparin  5,000 Units Subcutaneous Q8H  . pantoprazole (PROTONIX) IV  40 mg Intravenous Q12H  . piperacillin-tazobactam (ZOSYN)  IV  3.375 g Intravenous Q8H  . sodium chloride  3 mL Intravenous Q12H   Family History History reviewed. No pertinent family history.   Social History History   Social History  . Marital Status: Single    Spouse Name: N/A    Number of Children: N/A  . Years of Education: N/A   Occupational History  . Not on file.   Social History Main Topics  . Smoking status: Never Smoker   . Smokeless tobacco: Never Used  . Alcohol Use: No  . Drug Use: Yes    Special: Marijuana     Comment: occasionally  . Sexual Activity: Not on file   Other Topics Concern  . Not on file   Social History Narrative  . No narrative on file    Review of Systems  General:  No chills, fever, night sweats or weight changes.  Cardiovascular:  No chest  pain, dyspnea on exertion, edema, orthopnea, palpitations, paroxysmal nocturnal dyspnea. Dermatological: No rash, lesions/masses Respiratory: No cough, dyspnea Urologic: No hematuria, dysuria Abdominal:   No nausea, vomiting, diarrhea, bright red blood per rectum, melena, or hematemesis Neurologic:  No visual changes, wkns, changes in mental status. All other systems reviewed and are otherwise negative except as noted above.  Physical Exam  Blood pressure 122/80, pulse 74, temperature 98 F (36.7 C), temperature source Oral, resp. rate 18, height 5\' 7"  (1.702 m), weight 132 lb 15 oz (60.3 kg), SpO2 99.00%.  General: Pleasant, NAD Psych: Normal affect. Neuro: Alert and oriented X 3. Moves all extremities spontaneously. HEENT: Normal  Neck: Supple without bruits or JVD. Lungs:  Resp regular and unlabored, CTA. Heart: RRR no s3, s4, or murmurs. Abdomen: Soft, non-tender, non-distended, BS + x 4.  Extremities: No clubbing, cyanosis or edema. DP/PT/Radials 2+ and equal bilaterally.  Labs  Recent Labs  07/31/13 1120  TROPONINI <0.30   Lab Results  Component Value Date   WBC 8.1 07/28/2013   HGB 10.0* 07/28/2013   HCT 32.0* 07/28/2013   MCV 73.6* 07/28/2013   PLT 209 07/28/2013    Recent Labs Lab 07/27/13 0510 07/31/13 1120  NA 142 142  K 4.1 4.1  CL 105 104  CO2 21 26  BUN 16 3*  CREATININE 0.99 1.10  CALCIUM 8.5 9.2  PROT 6.7  --   BILITOT 0.3  --   ALKPHOS 63  --   ALT 7  --   AST 15  --   GLUCOSE 92 100*   Radiology/Studies  Dg Chest 2 View  07/30/2013   CLINICAL DATA:  Right lower chest pain and right upper abdomen pain. Pneumomediastinum.  EXAM: CHEST  2 VIEW  COMPARISON:  Chest x-ray dated 07/27/2013 and chest CT dated 07/26/2013  FINDINGS: Pneumomediastinum has almost completely resolved. Heart size and vascularity are normal and the lungs are clear. No pneumothorax. No osseous abnormality.  IMPRESSION: Minimal residual pneumomediastinum.   Electronically Signed    By: Geanie CooleyJim  Maxwell M.D.   On: 07/30/2013 08:56   Ct Chest Wo Contrast  07/26/2013   CLINICAL DATA:  Epigastric pain. Evaluate for perforation.   IMPRESSION: Small to moderate volume pneumomediastinum, without cause identified. No esophageal defect or extraenteric contrast identified.     Ct Abdomen Pelvis W Contrast  07/26/2013   IMPRESSION: 1. Small volume pneumomediastinum, of uncertain etiology, but may be related to history of recent vomiting. 2. No other CT evidence of acute intra-abdominal or pelvic process. Results were called by telephone at the time of interpretation on 07/26/2013 at 7:04 PM to Dr. Junius FinnerERIN O'MALLEY , who verbally acknowledged these results.    Echocardiogram - 07/31/2013 Left ventricle: The cavity size was normal. Wall thickness was normal. Systolic function was normal. The estimated ejection fraction was in the range of 50% to 55%. Wall motion was normal; there were no regional wall motion abnormalities. The transmitral flow pattern was normal. The deceleration time of the early transmitral flow velocity was normal. The pulmonary vein flow pattern was normal. The tissue Doppler parameters were normal. Left ventricular diastolic function parameters were normal. ------------------------------------------------------------ Aortic valve: Structurally normal valve. Cusp separation was normal. Doppler: Transvalvular velocity was within the normal range. There was no stenosis. No regurgitation. Aorta: The aorta was normal, not dilated, and non-diseased. Mitral valve: Structurally normal valve. Leaflet separation was normal. Doppler: Transvalvular velocity was within the normal range. There was no evidence for stenosis. Trivial regurgitation. Peak gradient: 2mm Hg (D). Left atrium: The atrium was normal in size. Right ventricle: The cavity size was normal. Wall thickness was normal. Systolic function was normal. Pulmonic valve: Structurally normal valve. Cusp separation  was normal. Doppler: Transvalvular velocity was within the normal range. No regurgitation. Tricuspid valve: Structurally normal valve. Leaflet separation was normal. Doppler: Transvalvular velocity was within the normal range. Trivial regurgitation. Pulmonary artery: The main pulmonary artery was normal-sized. Right atrium: The atrium was normal in size. Pericardium: The pericardium was normal in appearance. There was no pericardial effusion. Systemic veins: Inferior vena cava: The vessel was normal in size; the respirophasic diameter changes were in the normal range (= 50%); findings are consistent with normal central venous pressure. Ascending Aorta: - The aorta was normal, not dilated, and non-diseased.  ECG: SR, normal ECG   ASSESSMENT AND PLAN  A 28 year old male admitted for acute pneumomediastinum with suspected Esophageal perforation and nsVT. Echocardiom showed normal LVEF with no wall motion abnormalities. RV has normal size, function and regional wall motion as well, technically rulling out ARVC. Electrolytes were normal, including Na, K, Mg, P.  This is most probably secondary to side effects of drugs currently started - Zoloft (sertraline) < 1%, torsades de pointes, ventricular tachycardia, phenergan also has reported life-threatening arrhythmias with therapeutic doses of phenothiazines.  These medications are discontinued, we agree with that.  We will follow to monitor, no further work up at this point unless there is recurrence of VT.    Signed, Lars Masson, MD, [facility] 07/31/2013, 5:44 PM

## 2013-07-31 NOTE — Progress Notes (Signed)
Levi Palmer 12:06 PM  Subjective: Patient overly calm this morning and possibly withdrawn due to either medicines or recent arrhythmia but his abdomen is better and he continues to tolerate clear liquids and his case was discussed with his girlfriend as well  Objective: Vital signs stable afebrile lungs are clear heart regular rate and rhythm abdomen is softer and less tender than yesterday basic metabolic profile okay no new x-rays  Assessment: Pneumomediastinum in a patient with long-standing episodic nausea vomiting diarrhea abdominal pain and weight loss which comes and goes  Plan: Case was discussed with the hospital team and agree with barium swallow tomorrow and if okay slowly advance diet and as an outpatient or in the future consider gastric emptying study or possibly MRCP to continue his workup for his episodic complaints and he will also need social service consult before discharge and agree with psych eval which I understand has already been discussed  [facility],Ellene Bloodsaw E

## 2013-07-31 NOTE — Progress Notes (Signed)
Pt had 4 beats VTACH and then 4 more beats. MD aware. Cardiology consulted per Dr. Cena BentonVega. Stat EKG obtained. Will continue to monitor.

## 2013-08-01 ENCOUNTER — Inpatient Hospital Stay (HOSPITAL_COMMUNITY): Payer: Self-pay

## 2013-08-01 MED ORDER — BOOST / RESOURCE BREEZE PO LIQD
1.0000 | ORAL | Status: DC
  Administered 2013-08-01: 1 via ORAL

## 2013-08-01 MED ORDER — VITAMINS A & D EX OINT
TOPICAL_OINTMENT | CUTANEOUS | Status: AC
  Administered 2013-08-01: 21:00:00
  Filled 2013-08-01: qty 5

## 2013-08-01 MED ORDER — LORAZEPAM 2 MG/ML IJ SOLN
1.0000 mg | INTRAMUSCULAR | Status: DC | PRN
  Administered 2013-08-01 – 2013-08-02 (×6): 1 mg via INTRAVENOUS
  Filled 2013-08-01 (×6): qty 1

## 2013-08-01 MED ORDER — CHLORPROMAZINE HCL 10 MG PO TABS
10.0000 mg | ORAL_TABLET | Freq: Once | ORAL | Status: AC
  Administered 2013-08-01: 10 mg via ORAL
  Filled 2013-08-01: qty 1

## 2013-08-01 NOTE — Progress Notes (Signed)
Levi Palmer 3:38 PM  Subjective: Patient is doing the best mentally he has done since I met him and his chest pain is better and he was able to eat solid food and has no new complaints  Objective: Vital signs stable afebrile no acute distress abdomen is minimally tender but soft no guarding or rebound barium swallow okay  Assessment: Improved  Plan: Please let me know if I could be of any further assistance this hospital stay and consider in the future a gastric emptying study or possibly even MRCP to continue his GI workup  Levi Palmer E

## 2013-08-01 NOTE — Consult Note (Signed)
Case discussed, [facility]

## 2013-08-01 NOTE — Progress Notes (Signed)
Per Dr. Ewing SchleinMagod, patient have a soft solid diet. Will continue to monitor.

## 2013-08-01 NOTE — Progress Notes (Signed)
[facility] PROGRESS NOTE  Levi Palmer YQM:[id number] DOB: Mar 25, 1986 DOA: 07/26/2013 PCP: No PCP Per Patient  Assessment/Plan:  Principal Problem:   Pneumomediastinum/suspected Esophageal perforation - Gi and General surgery initially on board. They have completed their recommendations and currently have signed off -Barium swallow reported no evidence of esophageal perforation or leak - Advance diet per GI recommendations  None sustained V tach -  Telemetry reporting sinus rhythm with no arrhythmias - Would like to thank cardiology for their evaluation and recommendations - Electrolytes reviewed and patient's sodium magnesium and phosphorus as well as potassium levels within normal limits - Suspect recent use of sertraline, Phenergan, Zofran in combination as culprit. If he is having resolved off of these medications.  Active Problems:     AKI (acute kidney injury) - resolved with fluids and supportive therapy.  Anxiety/HI/Depression -Improved with Ativan as needed - Once medically cleared will transition to behavioral health hospital most likely next a.m. 08/02/2013.  DVT prevention -Heparin SQ  Code Status: full Family Communication: None at bedside Disposition Plan: Most likely to behavioral health 08/02/2013   Consultants:  Gastroenterology  General surgery  Procedures:  Barium swallow  Antibiotics:  Zosyn (day 4)  HPI/Subjective: Patient was complaining of abdominal discomfort. Has improved on Ativan. No new complaints overnight  Objective: Filed Vitals:   08/01/13 1448  BP: 104/67  Pulse: 82  Temp: 98.1 F (36.7 C)  Resp: 18    Intake/Output Summary (Last 24 hours) at 08/01/13 1804 Last data filed at 08/01/13 1100  Gross per 24 hour  Intake 3308.33 ml  Output   2200 ml  Net 1108.33 ml   Filed Weights   07/29/13 0636 07/30/13 0431 07/31/13 0545  Weight: 61.054 kg (134 lb 9.6 oz) 60.7 kg (133 lb 13.1 oz) 60.3 kg (132 lb  15 oz)    Exam:   General:  Pt in nad, alert and awake,   Cardiovascular: rrr, no mrg  Respiratory: CTA BL, no wheezes  Abdomen: soft, NT, ND  Musculoskeletal: no cyanosis, edema, or clubbing.   Data Reviewed: Basic Metabolic Panel:  Recent Labs Lab 07/26/13 1531 07/27/13 0510 07/31/13 1120  NA 140 142 142  K 4.5 4.1 4.1  CL 97 105 104  CO2 15* 21 26  GLUCOSE 141* 92 100*  BUN 16 16 3*  CREATININE 1.66* 0.99 1.10  CALCIUM 11.4* 8.5 9.2  MG  --   --  2.0  PHOS  --   --  3.4   Liver Function Tests:  Recent Labs Lab 07/26/13 1531 07/27/13 0510  AST 19 15  ALT 12 7  ALKPHOS 100 63  BILITOT 0.4 0.3  PROT 10.0* 6.7  ALBUMIN 5.4* 3.5    Recent Labs Lab 07/26/13 1531  LIPASE 13   No results found for this basename: AMMONIA,  in the last 168 hours CBC:  Recent Labs Lab 07/26/13 1531 07/27/13 0510 07/28/13 0419  WBC 21.2* 12.3* 8.1  NEUTROABS 19.0*  --   --   HGB 14.4 10.4* 10.0*  HCT 42.1 31.5* 32.0*  MCV 70.2* 71.4* 73.6*  PLT 417* 273 209   Cardiac Enzymes:  Recent Labs Lab 07/31/13 1120  TROPONINI <0.30   BNP (last 3 results)  Recent Labs  06/13/13 1444  PROBNP 21.1   CBG: No results found for this basename: GLUCAP,  in the last 168 hours  Recent Results (from the past 240 hour(s))  CULTURE, BLOOD (ROUTINE X 2)     Status: None  Collection Time    07/26/13 12:15 AM      Result Value Range Status   Specimen Description BLOOD LEFT ARM   Final   Special Requests BOTTLES DRAWN AEROBIC AND ANAEROBIC 5ML   Final   Culture  Setup Time     Final   Value: 07/27/2013 03:26     Performed at [facility]   Culture     Final   Value:        BLOOD CULTURE RECEIVED NO GROWTH TO DATE CULTURE WILL BE HELD FOR 5 DAYS BEFORE ISSUING A FINAL NEGATIVE REPORT     Performed at [facility]   Report Status PENDING   Incomplete  CULTURE, BLOOD (ROUTINE X 2)     Status: None   Collection Time    07/26/13 12:20 AM      Result  Value Range Status   Specimen Description BLOOD RIGHT HAND   Final   Special Requests BOTTLES DRAWN AEROBIC AND ANAEROBIC 5ML   Final   Culture  Setup Time     Final   Value: 07/27/2013 03:26     Performed at [facility]   Culture     Final   Value:        BLOOD CULTURE RECEIVED NO GROWTH TO DATE CULTURE WILL BE HELD FOR 5 DAYS BEFORE ISSUING A FINAL NEGATIVE REPORT     Performed at [facility]   Report Status PENDING   Incomplete     Studies: Dg Esophagus  08/01/2013   CLINICAL DATA:  New mediastinum, evaluate for leak  EXAM: ESOPHOGRAM/BARIUM SWALLOW  TECHNIQUE: Single contrast examination was performed using water-soluble contrast followed by thin barium.  COMPARISON:  Esophagram dated 07/27/2013  FLUOROSCOPY TIME:  37 seconds  FINDINGS: Exam was initially performed with water-soluble contrast.  Normal esophageal peristalsis. No fixed esophageal narrowing or stricture. No extraluminal contrast to suggest a leak.  Exam was then continued with a thickened contrast. Again, no extraluminal contrast is present to suggest a leak.  Visualized stomach is unremarkable.  Cholecystectomy clips.  IMPRESSION: No evidence of esophageal perforation/leak.   Electronically Signed   By: Charline BillsSriyesh  Krishnan M.D.   On: 08/01/2013 13:52    Scheduled Meds: . heparin  5,000 Units Subcutaneous Q8H  . pantoprazole (PROTONIX) IV  40 mg Intravenous Q12H  . piperacillin-tazobactam (ZOSYN)  IV  3.375 g Intravenous Q8H  . sodium chloride  3 mL Intravenous Q12H   Continuous Infusions: . dextrose 5 % and 0.45 % NaCl with KCl 20 mEq/L 100 mL/hr at 08/01/13 0314    Time spent: > 35 minutes    Levi Palmer, Levi Palmer  [facility] Pager [phone]. If 7PM-7AM, please contact night-coverage at [email or web address], password [email or web address] 08/01/2013, 6:04 PM  LOS: 6 days

## 2013-08-01 NOTE — Progress Notes (Signed)
The patient has the hiccups and is requesting Thorazine. PCP on call was notified.  Awaiting new orders. Will continue to monitor the patient.

## 2013-08-01 NOTE — Progress Notes (Signed)
Spoke with Dr. Cena BentonVega regarding psychiatry  note written 1/24 stating that pt is on edge of hurting himself or others.  Per psych NP both her and patient made a safety contract.  [facility] that he did not have suicidal/homicidal ideation.  Dr. Cena BentonVega stated that at this time he did not find it appropriate to have suicide sitter with patient when patient is denying ideations. Levi Palmer A

## 2013-08-01 NOTE — Progress Notes (Signed)
Patient slept for a few hours. Patient when he was awake, was asking for his PRN medications again. RN asked patient if patient had any medications that needed to be taken to pharmacy. Patient denied that he had any medications in his room. Will continue to monitor the patient.

## 2013-08-01 NOTE — Progress Notes (Signed)
Called Behavioral Health three times to reach Nanine MeansJamison Lord, NP. Each time I was hung up on when trying to ne transferred. Will call make in 1 hour and continue to monitor patient closely.

## 2013-08-01 NOTE — Progress Notes (Signed)
[facility] Pt "contracted for safety" when expressing suicidal /homicidial ideations. Patient is currently not expressing suicidal or homicidal ideations. Patient is willing to go to Behavior Health tomorrow if discharged.

## 2013-08-01 NOTE — Progress Notes (Signed)
Patient Name: Levi Palmer Date of Encounter: 08/01/2013  Principal Problem:   Pneumomediastinum Active Problems:   suspected Esophageal perforation   Gastroenteritis   AKI (acute kidney injury)   Depression   Anxiety   Non-sustained ventricular tachycardia   Length of Stay: 6  SUBJECTIVE  No palpitations or chest pain overnight.   CURRENT MEDS . heparin  5,000 Units Subcutaneous Q8H  . pantoprazole (PROTONIX) IV  40 mg Intravenous Q12H  . piperacillin-tazobactam (ZOSYN)  IV  3.375 g Intravenous Q8H  . sodium chloride  3 mL Intravenous Q12H    OBJECTIVE  Filed Vitals:   07/31/13 0545 07/31/13 1454 07/31/13 2036 08/01/13 0517  BP: 141/88 122/80 134/87 131/90  Pulse: 71 74 85 82  Temp: 98.1 F (36.7 C) 98 F (36.7 C) 98.1 F (36.7 C) 98 F (36.7 C)  TempSrc: Oral Oral Oral Oral  Resp: 18 18 18 18   Height:      Weight: 132 lb 15 oz (60.3 kg)     SpO2: 100% 99% 100% 100%    Intake/Output Summary (Last 24 hours) at 08/01/13 0805 Last data filed at 08/01/13 0542  Gross per 24 hour  Intake 3458.33 ml  Output   1900 ml  Net 1558.33 ml   Filed Weights   07/29/13 0636 07/30/13 0431 07/31/13 0545  Weight: 134 lb 9.6 oz (61.054 kg) 133 lb 13.1 oz (60.7 kg) 132 lb 15 oz (60.3 kg)    PHYSICAL EXAM  General: Pleasant, NAD. Neuro: Alert and oriented X 3. Moves all extremities spontaneously. Psych: Normal affect. HEENT:  Normal  Neck: Supple without bruits or JVD. Lungs:  Resp regular and unlabored, CTA. Heart: RRR no s3, s4, or murmurs. Abdomen: Soft, non-tender, non-distended, BS + x 4.  Extremities: No clubbing, cyanosis or edema. DP/PT/Radials 2+ and equal bilaterally.  Accessory Clinical Findings  CBC No results found for this basename: WBC, NEUTROABS, HGB, HCT, MCV, PLT,  in the last 72 hours Basic Metabolic Panel  Recent Labs  07/31/13 1120  NA 142  K 4.1  CL 104  CO2 26  GLUCOSE 100*  BUN 3*  CREATININE 1.10  CALCIUM 9.2    MG 2.0  PHOS 3.4    Recent Labs  07/31/13 1120  TROPONINI <0.30    Recent Labs  07/31/13 1120  TSH 0.681    Radiology/Studies  Dg Chest 2 View  07/30/2013   CLINICAL DATA:  Right lower chest pain and right upper abdomen pain. Pneumomediastinum.  EXAM: CHEST  2 VIEW  COMPARISON:  Chest x-ray dated 07/27/2013 and chest CT dated 07/26/2013  FINDINGS: Pneumomediastinum has almost completely resolved. Heart size and vascularity are normal and the lungs are clear. No pneumothorax. No osseous abnormality.  IMPRESSION: Minimal residual pneumomediastinum.   Electronically Signed   By: Geanie CooleyJim  Maxwell M.D.   On: 07/30/2013 08:56   TELE: Sinus rhythm, no arhhythmias   ASSESSMENT AND PLAN  A 28 year old male admitted for acute pneumomediastinum with suspected Esophageal perforation and nsVT. Echocardiom showed normal LVEF with no wall motion abnormalities. RV has normal size, function and regional wall motion as well, technically rulling out ARVC. Electrolytes were normal, including Na, K, Mg, P.  This is most probably secondary to side effects of drugs currently started - Zoloft (sertraline) < 1%, torsades de pointes, ventricular tachycardia, phenergan also has reported life-threatening arrhythmias with therapeutic doses of phenothiazines.  These medications are discontinued, we agree with that.   There are no more  episodes of VT overnight. We will continue to monitor. If the patient requires surgery of procedure, we would recommend a low dose metoprolol 12.5 mg po BID in the periprocedural period.   We will follow.   Signed, Tobias Alexander, H MD, [facility] 08/01/2013

## 2013-08-01 NOTE — Progress Notes (Signed)
Paged Dr. Ewing SchleinMagod about Barium Swallow. Order given for STAT Barium Swallow Evaluation. Placed order and will call Radiology to set up.   Forestine ChuteBurns, Annlouise Gerety Elizabeth

## 2013-08-02 DIAGNOSIS — F329 Major depressive disorder, single episode, unspecified: Secondary | ICD-10-CM

## 2013-08-02 LAB — CULTURE, BLOOD (ROUTINE X 2)
CULTURE: NO GROWTH
Culture: NO GROWTH

## 2013-08-02 MED ORDER — ENSURE COMPLETE PO LIQD: 237.0000 mL | Freq: Once | ORAL | Status: DC

## 2013-08-02 MED ORDER — HYDROXYZINE HCL 25 MG PO TABS: 25.0000 mg | ORAL_TABLET | Freq: Four times a day (QID) | ORAL | Status: DC | PRN

## 2013-08-02 MED ORDER — BOOST PLUS PO LIQD
237.0000 mL | Freq: Once | ORAL | Status: DC
  Filled 2013-08-02: qty 237

## 2013-08-02 MED ORDER — ENSURE COMPLETE PO LIQD: 237.0000 mL | Freq: Two times a day (BID) | ORAL | Status: DC

## 2013-08-02 MED ORDER — PANTOPRAZOLE SODIUM 40 MG PO TBEC: 40.0000 mg | DELAYED_RELEASE_TABLET | Freq: Every day | ORAL | Status: DC

## 2013-08-02 MED ORDER — LORAZEPAM 1 MG PO TABS: 1.0000 mg | ORAL_TABLET | Freq: Four times a day (QID) | ORAL | Status: DC | PRN

## 2013-08-02 MED ORDER — CHLORPROMAZINE HCL 10 MG PO TABS
10.0000 mg | ORAL_TABLET | Freq: Three times a day (TID) | ORAL | Status: DC | PRN
  Administered 2013-08-02: 10 mg via ORAL
  Filled 2013-08-02: qty 1

## 2013-08-02 MED ORDER — OXYCODONE HCL 5 MG PO CAPS: 5.0000 mg | ORAL_CAPSULE | ORAL | Status: DC | PRN

## 2013-08-02 MED ORDER — PANTOPRAZOLE SODIUM 40 MG PO TBEC: 40.0000 mg | DELAYED_RELEASE_TABLET | Freq: Two times a day (BID) | ORAL | Status: DC

## 2013-08-02 NOTE — Progress Notes (Signed)
Clinical Social Work  CSW met with patient to discuss DC plans. Patient is refusing to go to [facility] and wants to DC home. CSW spoke with [facility] who reports he will contact psych MD to re-evaluate patient to determine if inpatient is needed or if patient can DC with outpatient services.  CSW updated charge RN on barriers. Full assessment to follow.  [place], [place] [phone]

## 2013-08-02 NOTE — Consult Note (Signed)
Psychiatry Follow up Note   Assessment: AXIS I:  Anxiety Disorder NOS and Major Depression, single episode AXIS II:  Deferred AXIS III:   Past Medical History  Diagnosis Date  . Medical history non-contributory    AXIS IV:  economic problems, other psychosocial or environmental problems, problems related to social environment and problems with primary support group AXIS V:  61-70 mild symptoms  Plan:  No evidence of imminent risk to self or others at present.   Patient does not meet criteria for psychiatric inpatient admission. Supportive therapy provided about ongoing stressors. Discussed crisis plan, support from social network, calling 911, coming to the Emergency Department, and calling Suicide Hotline.--500 hall at [facility].   Dr. Dwyane Dee reviewed the patient and concurs with treatment plan with the addition of Vistaril 25 mg every six hours PRN anxiety and Zoloft 50 mg daily for depression.  Subjective:   Levi Palmer is a 28 y.o. male patient admitted with nausea and vomiting.  Patient was seen in consultation liaison services for depression and suicidal ideations and homicidal ideations.  Patient was recommended inpatient psychiatric services however patient does not want to come for inpatient services.  Patient told it was a confusion that he mentioned about suicidal thinking homicidal thinking.  He wants to get help outpatient.  He was recommended to try Zoloft however due to arrhythmia it was discontinued.  The patient appears very tense and anxious but denies any suicidal thoughts or homicidal thoughts.  He denies any hallucination or any paranoia.  I spoke to his girlfriend who also endorsed that patient requires outpatient services.  The patient has no insurance however like to followup at [facility].  Patient denies any paranoia, hallucination, visual hallucination of any aggressive or combative behavior.  He admitted chronic stress and anxiety related to his physical health  and he needs someone who he can talk for his depression and if needed he is open to try antidepressants.  Past Psychiatric History: Past Medical History  Diagnosis Date  . Medical history non-contributory     reports that he has never smoked. He has never used smokeless tobacco. He reports that he uses illicit drugs (Marijuana). He reports that he does not drink alcohol. History reviewed. No pertinent family history.   Living Arrangements:  (girlfriend and friend)   Abuse/Neglect [facility]) Physical Abuse: Denies Verbal Abuse: Denies Sexual Abuse: Denies Allergies:  No Known Allergies   Objective: Blood pressure 105/73, pulse 92, temperature 97.8 F (36.6 C), temperature source Oral, resp. rate 18, height 5' 7"  (1.702 m), weight 132 lb 4.4 oz (60 kg), SpO2 99.00%.Body mass index is 20.71 kg/(m^2). Results for orders placed during the hospital encounter of 07/26/13 (from the past 72 hour(s))  MAGNESIUM     Status: None   Collection Time    07/31/13 11:20 AM      Result Value Range   Magnesium 2.0  1.5 - 2.5 mg/dL  BASIC METABOLIC PANEL     Status: Abnormal   Collection Time    07/31/13 11:20 AM      Result Value Range   Sodium 142  137 - 147 mEq/L   Potassium 4.1  3.7 - 5.3 mEq/L   Chloride 104  96 - 112 mEq/L   CO2 26  19 - 32 mEq/L   Glucose, Bld 100 (*) 70 - 99 mg/dL   BUN 3 (*) 6 - 23 mg/dL   Creatinine, Ser 1.10  0.50 - 1.35 mg/dL   Calcium 9.2  8.4 -  10.5 mg/dL   GFR calc non Af Amer >90  >90 mL/min   GFR calc Af Amer >90  >90 mL/min   Comment: (NOTE)     The eGFR has been calculated using the CKD EPI equation.     This calculation has not been validated in all clinical situations.     eGFR's persistently <90 mL/min signify possible Chronic Kidney     Disease.  TSH     Status: None   Collection Time    07/31/13 11:20 AM      Result Value Range   TSH 0.681  0.350 - 4.500 uIU/mL   Comment: Performed at [facility]  TROPONIN I     Status: None   Collection  Time    07/31/13 11:20 AM      Result Value Range   Troponin I <0.30  <0.30 ng/mL   Comment:            Due to the release kinetics of cTnI,     a negative result within the first hours     of the onset of symptoms does not rule out     myocardial infarction with certainty.     If myocardial infarction is still suspected,     repeat the test at appropriate intervals.  PHOSPHORUS     Status: None   Collection Time    07/31/13 11:20 AM      Result Value Range   Phosphorus 3.4  2.3 - 4.6 mg/dL   Labs are reviewed and are pertinent for medical issues currently being treated.  Current Facility-Administered Medications  Medication Dose Route Frequency Provider Last Rate Last Dose  . chlorproMAZINE (THORAZINE) tablet 10 mg  10 mg Oral TID PRN Gardiner Barefoot, NP   10 mg at 08/02/13 0209  . dextrose 5 % and 0.45 % NaCl with KCl 20 mEq/L infusion   Intravenous Continuous Velvet Bathe, MD 100 mL/hr at 08/01/13 0314    . heparin injection 5,000 Units  5,000 Units Subcutaneous Q8H Berle Mull, MD   5,000 Units at 08/02/13 0704  . HYDROmorphone (DILAUDID) injection 1 mg  1 mg Intravenous Q3H PRN Velvet Bathe, MD   1 mg at 08/02/13 1148  . hydrOXYzine (ATARAX/VISTARIL) tablet 25 mg  25 mg Oral Q6H PRN Waylan Boga, NP   25 mg at 08/02/13 0308  . LORazepam (ATIVAN) injection 1 mg  1 mg Intravenous Q4H PRN Velvet Bathe, MD   1 mg at 08/02/13 1320  . pantoprazole (PROTONIX) EC tablet 40 mg  40 mg Oral BID Kara Mead, [facility]      . piperacillin-tazobactam (ZOSYN) IVPB 3.375 g  3.375 g Intravenous Q8H Dorrene German, RPH   3.375 g at 08/02/13 1011  . sodium chloride 0.9 % injection 3 mL  3 mL Intravenous Q12H Berle Mull, MD   3 mL at 08/01/13 2144    Psychiatric Specialty Exam:     Blood pressure 105/73, pulse 92, temperature 97.8 F (36.6 C), temperature source Oral, resp. rate 18, height 5' 7"  (1.702 m), weight 132 lb 4.4 oz (60 kg), SpO2 99.00%.Body mass index is 20.71  kg/(m^2).  General Appearance: Disheveled  Eye Contact::  Minimal  Speech:  Normal Rate  Volume:  Normal  Mood:  Anxious, Depressed and Irritable   Affect:  Depressed  Thought Process:  Coherent  Orientation:  Full (Time, Place, and Person)  Thought Content:  Rumination  Suicidal Thoughts:  No  Homicidal Thoughts:  No  Memory:  Immediate;   Fair Recent;   Fair Remote;   Fair  Judgement:  Fair  Insight:  Fair  Psychomotor Activity:  Decreased  Concentration:  Fair  Recall:  Fair  Akathisia:  No  Handed:  Right  AIMS (if indicated):     Assets:  Resilience  Sleep:      Treatment Plan Summary: Patient does not meet criteria for inpatient psychiatric treatment.  He does not need inpatient psychiatric services.  Social worker can arrange for outpatient referral to [place].  The patient and his girlfriend agree with the plan.  Please call [phone] if you have any further questions.  The patient is not interested to start an antidepressant at this time.  Jernie Schutt T. 08/02/2013 1:56 PM

## 2013-08-02 NOTE — Discharge Summary (Addendum)
Physician Discharge Summary  Levi Palmer WUJ:[id number] DOB: 1986/01/08 DOA: 07/26/2013  PCP: No PCP Per Patient  Admit date: 07/26/2013 Discharge date: 08/02/2013  Time spent: > 35 minutes  Recommendations for Outpatient Follow-up:  1. Patient was taken off of Zoloft due to arrythmia associated with recent ingestion. Please avoid any SSRI's associated with cardiac arrythmia's.  2. Pt will need to follow up with pcp in 2-3 weeks or sooner should any new concerns arise.  Discharge Diagnoses:  Principal Problem:   Pneumomediastinum Active Problems:   suspected Esophageal perforation   Gastroenteritis   AKI (acute kidney injury)   Depression   Anxiety   Non-sustained ventricular tachycardia   Discharge Condition: stable  Diet recommendation: regular diet  Filed Weights   07/30/13 0431 07/31/13 0545 08/02/13 0653  Weight: 60.7 kg (133 lb 13.1 oz) 60.3 kg (132 lb 15 oz) 60 kg (132 lb 4.4 oz)    History of present illness:  Patient is a 28 year old with past medical history of cholecystectomy and substance abuse. He presented to the ED secondary to nausea and vomiting. Further workup would reveal pneumomediastinum. Of which Gen. surgery and gastroenterology were subsequently consulted  Hospital Course:  Principal Problem:  Pneumomediastinum/suspected Esophageal perforation  - Gi and General surgery initially on board initially.  Gastroenterology recommendations were: consider in the future a gastric emptying study or possibly even MRCP to continue his GI workup General surgeries was involved and did not recommend any surgical intervention. -Barium swallow reported no evidence of esophageal perforation or leak  -Diet advance and tolerated - Will provide oxycodone IR on discharge for abdominal discomfort. - Recommend discontinuation of Vaping  None sustained V tach  - Telemetry reporting sinus rhythm with no arrhythmias  - Electrolytes reviewed and patient's  sodium magnesium and phosphorus as well as potassium levels within normal limits  - Cardiology on board and recommended the following:  This is most probably secondary to side effects of drugs currently started - Zoloft (sertraline) < 1%, torsades de pointes, ventricular tachycardia, phenergan also has reported life-threatening arrhythmias with therapeutic doses of phenothiazines.  These medications are discontinued, we agree with that.  There are no more episodes of VT or PVCs in the last 24 hours. The patient is ok to be discharged from cardiology standpoint. If the patient requires surgery of procedure, we would recommend a low dose metoprolol 12.5 mg po BID in the periprocedural period.   Active Problems:  AKI (acute kidney injury)  - resolved with fluids and supportive therapy.   Anxiety/HI/Depression  -I think this is contributing to patient's GI complaints. Patient has been on Ativan and condition has improved. We'll defer to psychiatrist for further evaluation and recommendations - Patient denies any suicidal ideation and did not report any on day of discharge.    Procedures:  Barium swallows  Consultations:  Gastroenterology  General surgery  Discharge Exam: Filed Vitals:   08/02/13 0445  BP: 105/73  Pulse: 92  Temp: 97.8 F (36.6 C)  Resp: 18    General: Pt in NAD, Alert and awake Cardiovascular: RRR, no MRG Respiratory: CTA BL, no wheezes  Discharge Instructions  Discharge Orders   Future Appointments Provider Department Dept Phone   08/19/2013 11:00 AM [facility] Covering Provider [facility] [phone]   08/22/2013 12:00 PM [facility] Financial Counselor [facility] [phone]   Future Orders Complete By Expires   Call MD for:  persistant nausea and vomiting  As directed  Call MD for:  severe uncontrolled pain  As directed    Call MD for:  temperature >100.4  As directed    Diet - low sodium  heart healthy  As directed    Increase activity slowly  As directed        Medication List         hydrOXYzine 25 MG tablet  Commonly known as:  ATARAX/VISTARIL  Take 1 tablet (25 mg total) by mouth every 6 (six) hours as needed for anxiety.     LORazepam 1 MG tablet  Commonly known as:  ATIVAN  Take 1 tablet (1 mg total) by mouth every 6 (six) hours as needed for anxiety.     oxycodone 5 MG capsule  Commonly known as:  OXY-IR  Take 1 capsule (5 mg total) by mouth every 4 (four) hours as needed for pain.     pantoprazole 40 MG tablet  Commonly known as:  PROTONIX  Take 1 tablet (40 mg total) by mouth daily.       No Known Allergies     Follow-up Information   Follow up with Jeanann LewandowskyJEGEDE, OLUGBEMIGA, MD. (appointment at 08/19/13 at 11 AM with PCP. 08/22/13 at 12 noon [facility]. Please take all medications with you.)    Specialty:  Internal Medicine   Contact information:   [address] [place] [id number] [phone]        The results of significant diagnostics from this hospitalization (including imaging, microbiology, ancillary and laboratory) are listed below for reference.    Significant Diagnostic Studies: Dg Chest 2 View  07/30/2013   CLINICAL DATA:  Right lower chest pain and right upper abdomen pain. Pneumomediastinum.  EXAM: CHEST  2 VIEW  COMPARISON:  Chest x-ray dated 07/27/2013 and chest CT dated 07/26/2013  FINDINGS: Pneumomediastinum has almost completely resolved. Heart size and vascularity are normal and the lungs are clear. No pneumothorax. No osseous abnormality.  IMPRESSION: Minimal residual pneumomediastinum.   Electronically Signed   By: Geanie CooleyJim  Maxwell M.D.   On: 07/30/2013 08:56   X-ray Chest Pa And Lateral   07/27/2013   CLINICAL DATA:  Pneumomediastinum.  EXAM: CHEST  2 VIEW  COMPARISON:  CT chest 07/26/2012 and PA and lateral chest 06/25/2013.  FINDINGS: Pneumomediastinum is identified as on the CT scan. There is no pneumothorax.  The lungs are clear. Heart size is normal. No pleural effusion is seen. No focal bony abnormality.  IMPRESSION: Pneumomediastinum.  Negative for pneumothorax.  Lungs clear.   Electronically Signed   By: Drusilla Kannerhomas  Dalessio M.D.   On: 07/27/2013 07:54   Ct Chest Wo Contrast  07/26/2013   CLINICAL DATA:  Epigastric pain. Evaluate for perforation. Concern of esophageal perforation.  EXAM: CT CHEST WITHOUT CONTRAST  TECHNIQUE: Multidetector CT imaging of the chest was performed following the standard protocol without IV contrast.  COMPARISON:  DG CHEST 2 VIEW dated 06/25/2013; DG CHEST 2 VIEW dated 06/13/2013; CT ABD/PELVIS W CM dated 07/26/2013  FINDINGS: Lungs/Pleura: There is small to moderate volume pneumomediastinum, including tracking into the neck. Lungs are clear, without pneumothorax or pleural fluid. No contrast extravasation from the esophagus. No esophageal defect.  Heart/Mediastinum: Normal heart size, without pericardial effusion. No mediastinal or definite hilar adenopathy, given limitations of unenhanced CT.  Upper Abdomen: Contrast within the stomach and renal collecting systems. Cholecystectomy.  Bones/Musculoskeletal:  No acute osseous abnormality.  IMPRESSION: Small to moderate volume pneumomediastinum, without cause identified. No esophageal defect or extraenteric contrast identified.  Electronically Signed   By: Jeronimo Greaves M.D.   On: 07/26/2013 20:16   Ct Abdomen Pelvis W Contrast  07/26/2013   CLINICAL DATA:  Nausea vomiting  EXAM: CT ABDOMEN AND PELVIS WITH CONTRAST  TECHNIQUE: Multidetector CT imaging of the abdomen and pelvis was performed using the standard protocol following bolus administration of intravenous contrast.  CONTRAST:  OMNIPAQUE IOHEXOL 300 MG/ML  SOLN  COMPARISON:  None.  FINDINGS: The visualized lung bases are clear.  Subcentimeter hypodensity within the posterior right hepatic lobe is noted, too small the characterize by CT. The liver is otherwise unremarkable.  Gallbladder is surgically absent. No biliary ductal dilatation. The spleen, adrenal glands, and pancreas demonstrate a normal contrast enhanced appearance.  The kidneys are equal in size with symmetric nephrograms. No hydronephrosis, nephrolithiasis, or focal enhancing renal mass.  Small volume of pneumomediastinum is partially visualized within the lower thorax. Gas is also seen extending posteriorly within the right extrapleural space with a few foci of gas tracking posteriorly into the soft tissues near the right T11 and T12 neural foramen. No free intraperitoneal air identified.  The stomach is mildly distended with air-fluid level. No evidence of bowel obstruction. Appendix is not definitely visualize, however, no inflammatory changes are seen within the right lower quadrant to suggest acute appendicitis. No abnormal mucosal enhancement, wall thickening, or inflammatory fat stranding seen about the bowels.  Bladder is unremarkable.  Prostate is within normal limits.  No free fluid within the abdomen.  No pathologically enlarged intra-abdominal pelvic lymph nodes.  Osseous structures are within normal limits. No focal lytic or blastic osseous lesions.  IMPRESSION: 1. Small volume pneumomediastinum, of uncertain etiology, but may be related to history of recent vomiting. 2. No other CT evidence of acute intra-abdominal or pelvic process. Results were called by telephone at the time of interpretation on 07/26/2013 at 7:04 PM to Dr. Junius Finner , who verbally acknowledged these results.   Electronically Signed   By: Rise Mu M.D.   On: 07/26/2013 19:04   Dg Esophagus  08/01/2013   CLINICAL DATA:  New mediastinum, evaluate for leak  EXAM: ESOPHOGRAM/BARIUM SWALLOW  TECHNIQUE: Single contrast examination was performed using water-soluble contrast followed by thin barium.  COMPARISON:  Esophagram dated 07/27/2013  FLUOROSCOPY TIME:  37 seconds  FINDINGS: Exam was initially performed with water-soluble  contrast.  Normal esophageal peristalsis. No fixed esophageal narrowing or stricture. No extraluminal contrast to suggest a leak.  Exam was then continued with a thickened contrast. Again, no extraluminal contrast is present to suggest a leak.  Visualized stomach is unremarkable.  Cholecystectomy clips.  IMPRESSION: No evidence of esophageal perforation/leak.   Electronically Signed   By: Charline Bills M.D.   On: 08/01/2013 13:52   Dg Esophagus W/water Sol Cm  07/27/2013   CLINICAL DATA:  Pneumomediastinum. Evaluate for esophageal perforation.  EXAM: ESOPHOGRAM/BARIUM SWALLOW  TECHNIQUE: Single contrast examination was performed using  water-soluble.  COMPARISON:  DG CHEST 2 VIEW dated 07/27/2013; CT CHEST W/O CM dated 07/26/2013  FLUOROSCOPY TIME:  0 min 33 seconds.  FINDINGS: The patient drank Omnipaque 300. No evidence of an esophageal or gastroesophageal junction leak.  IMPRESSION: No leak.   Electronically Signed   By: Leanna Battles M.D.   On: 07/27/2013 09:06    Microbiology: Recent Results (from the past 240 hour(s))  CULTURE, BLOOD (ROUTINE X 2)     Status: None   Collection Time    07/26/13 12:15 AM  Result Value Range Status   Specimen Description BLOOD LEFT ARM   Final   Special Requests BOTTLES DRAWN AEROBIC AND ANAEROBIC   Final   Culture  Setup Time     Final   Value: 07/27/2013 03:26     Performed at [facility]   Culture     Final   Value: NO GROWTH 5 DAYS     Performed at [facility]   Report Status 08/02/2013 FINAL   Final  CULTURE, BLOOD (ROUTINE X 2)     Status: None   Collection Time    07/26/13 12:20 AM      Result Value Range Status   Specimen Description BLOOD RIGHT HAND   Final   Special Requests BOTTLES DRAWN AEROBIC AND ANAEROBIC   Final   Culture  Setup Time     Final   Value: 07/27/2013 03:26     Performed at [facility]   Culture     Final   Value: NO GROWTH 5 DAYS     Performed at [facility]   Report  Status 08/02/2013 FINAL   Final     Labs: Basic Metabolic Panel:  Recent Labs Lab 07/26/13 1531 07/27/13 0510 07/31/13 1120  NA 140 142 142  K 4.5 4.1 4.1  CL 97 105 104  CO2 15* 21 26  GLUCOSE 141* 92 100*  BUN 16 16 3*  CREATININE 1.66* 0.99 1.10  CALCIUM 11.4* 8.5 9.2  MG  --   --  2.0  PHOS  --   --  3.4   Liver Function Tests:  Recent Labs Lab 07/26/13 1531 07/27/13 0510  AST 19 15  ALT 12 7  ALKPHOS 100 63  BILITOT 0.4 0.3  PROT 10.0* 6.7  ALBUMIN 5.4* 3.5    Recent Labs Lab 07/26/13 1531  LIPASE 13   No results found for this basename: AMMONIA,  in the last 168 hours CBC:  Recent Labs Lab 07/26/13 1531 07/27/13 0510 07/28/13 0419  WBC 21.2* 12.3* 8.1  NEUTROABS 19.0*  --   --   HGB 14.4 10.4* 10.0*  HCT 42.1 31.5* 32.0*  MCV 70.2* 71.4* 73.6*  PLT 417* 273 209   Cardiac Enzymes:  Recent Labs Lab 07/31/13 1120  TROPONINI <0.30   BNP: BNP (last 3 results)  Recent Labs  06/13/13 1444  PROBNP 21.1   CBG: No results found for this basename: GLUCAP,  in the last 168 hours     Signed:  Penny Pia  [facility] 08/02/2013, 10:10 AM    Addendum: Received a call from nursing and social work.  Both stating that psychiatry has cleared patient for discharge to home with outpatient psychiatric follow up.

## 2013-08-02 NOTE — Progress Notes (Signed)
Patient Name: Levi Palmer Date of Encounter: 08/02/2013  Principal Problem:   Pneumomediastinum Active Problems:   suspected Esophageal perforation   Gastroenteritis   AKI (acute kidney injury)   Depression   Anxiety   Non-sustained ventricular tachycardia   Length of Stay: 7  SUBJECTIVE  No palpitations or chest pain overnight.   CURRENT MEDS . heparin  5,000 Units Subcutaneous Q8H  . pantoprazole (PROTONIX) IV  40 mg Intravenous Q12H  . piperacillin-tazobactam (ZOSYN)  IV  3.375 g Intravenous Q8H  . sodium chloride  3 mL Intravenous Q12H    OBJECTIVE  Filed Vitals:   08/01/13 1448 08/01/13 2049 08/02/13 0445 08/02/13 0653  BP: 104/67 111/67 105/73   Pulse: 82 84 92   Temp: 98.1 F (36.7 C) 97.6 F (36.4 C) 97.8 F (36.6 C)   TempSrc: Oral Oral Oral   Resp: 18 18 18    Height:      Weight:    132 lb 4.4 oz (60 kg)  SpO2: 100% 99% 99%     Intake/Output Summary (Last 24 hours) at 08/02/13 0830 Last data filed at 08/02/13 0824  Gross per 24 hour  Intake   1130 ml  Output   2000 ml  Net   -870 ml   Filed Weights   07/30/13 0431 07/31/13 0545 08/02/13 0653  Weight: 133 lb 13.1 oz (60.7 kg) 132 lb 15 oz (60.3 kg) 132 lb 4.4 oz (60 kg)    PHYSICAL EXAM  General: Pleasant, NAD. Neuro: Alert and oriented X 3. Moves all extremities spontaneously. Psych: Normal affect. HEENT:  Normal  Neck: Supple without bruits or JVD. Lungs:  Resp regular and unlabored, CTA. Heart: RRR no s3, s4, or murmurs. Abdomen: Soft, non-tender, non-distended, BS + x 4.  Extremities: No clubbing, cyanosis or edema. DP/PT/Radials 2+ and equal bilaterally.  Accessory Clinical Findings  CBC No results found for this basename: WBC, NEUTROABS, HGB, HCT, MCV, PLT,  in the last 72 hours Basic Metabolic Panel  Recent Labs  07/31/13 1120  NA 142  K 4.1  CL 104  CO2 26  GLUCOSE 100*  BUN 3*  CREATININE 1.10  CALCIUM 9.2  MG 2.0  PHOS 3.4    Recent Labs  07/31/13 1120  TROPONINI <0.30    Recent Labs  07/31/13 1120  TSH 0.681    Radiology/Studies  Dg Chest 2 View  07/30/2013   CLINICAL DATA:  Right lower chest pain and right upper abdomen pain. Pneumomediastinum.  EXAM: CHEST  2 VIEW  COMPARISON:  Chest x-ray dated 07/27/2013 and chest CT dated 07/26/2013  FINDINGS: Pneumomediastinum has almost completely resolved. Heart size and vascularity are normal and the lungs are clear. No pneumothorax. No osseous abnormality.  IMPRESSION: Minimal residual pneumomediastinum.   Electronically Signed   By: Geanie CooleyJim  Maxwell M.D.   On: 07/30/2013 08:56   TELE: Sinus rhythm, no arhhythmias   ASSESSMENT AND PLAN  A 28 year old male admitted for acute pneumomediastinum with suspected Esophageal perforation and nsVT. Echocardiom showed normal LVEF with no wall motion abnormalities. RV has normal size, function and regional wall motion as well, technically rulling out ARVC. Electrolytes were normal, including Na, K, Mg, P.  This is most probably secondary to side effects of drugs currently started - Zoloft (sertraline) < 1%, torsades de pointes, ventricular tachycardia, phenergan also has reported life-threatening arrhythmias with therapeutic doses of phenothiazines.  These medications are discontinued, we agree with that.   There are no more  episodes of VT or PVCs in the last 24 hours. The patient is ok to be discharged from cardiology standpoint. If the patient requires surgery of procedure, we would recommend a low dose metoprolol 12.5 mg po BID in the periprocedural period.    Signed, Tobias Alexander, H MD, [facility] 08/02/2013

## 2013-08-02 NOTE — Progress Notes (Signed)
Clinical Social Work Department CLINICAL SOCIAL WORK PSYCHIATRY SERVICE LINE ASSESSMENT 08/02/2013  Patient:  Levi Palmer [facility]  Account:  [id number]  [name] Date:  07/26/2013  Clinical Social Worker:  Sindy Messing, LCSW  Date/Time:  08/02/2013 12:00 N Referred by:  Physician  Date referred:  08/02/2013 Reason for Referral  Psychosocial assessment   Presenting Symptoms/Problems (In the person's/family's own words):   Psych consulted due to anxiety concerns   Abuse/Neglect/Trauma History (check all that apply)  Denies history   Abuse/Neglect/Trauma Comments:   Psychiatric History (check all that apply)  Denies history   Psychiatric medications:  Ativan 1 mg   Current Mental Health Hospitalizations/Previous Mental Health History:   Patient denies any previous MH diagnosis. Patient reports he feels depressed and anxious but does not want any formal treatment. Patient denies any previous treatment.   Current provider:   None   Place and Date:   N/A   Current Medications:   Scheduled Meds:      . heparin  5,000 Units Subcutaneous Q8H  . pantoprazole  40 mg Oral BID  . piperacillin-tazobactam (ZOSYN)  IV  3.375 g Intravenous Q8H  . sodium chloride  3 mL Intravenous Q12H        Continuous Infusions:      . dextrose 5 % and 0.45 % NaCl with KCl 20 mEq/L 100 mL/hr at 08/01/13 0314          PRN Meds:.chlorproMAZINE, HYDROmorphone (DILAUDID) injection, hydrOXYzine, LORazepam       Previous Impatient Admission/Date/Reason:   None reported   Emotional Health / Current Symptoms    Suicide/Self Harm  None reported   Suicide attempt in the past:   Patient denies any SI or HI. Patient reports he made comments about wanting to jump out of the window in order to get attention. Patient reports he did not actually mean this statement and does not have any thoughts of harming himself.   Other harmful behavior:   None reported   Psychotic/Dissociative Symptoms  None reported    Other Psychotic/Dissociative Symptoms:   N/A    Attention/Behavioral Symptoms  Restless   Other Attention / Behavioral Symptoms:   Patient guarded and restless throughout assessment.    Cognitive Impairment  Within Normal Limits   Other Cognitive Impairment:   Patient alert and oriented.    Mood and Adjustment  Guarded    Stress, Anxiety, Trauma, Any Recent Loss/Stressor  Anxiety   Anxiety (frequency):   Patient reports he feels anxious and needs medication to help with panic attacks. Patient refuses to answer any further questions re: symptoms and reports he has told enough people about his problems.   Phobia (specify):   N/A   Compulsive behavior (specify):   N/A   Obsessive behavior (specify):   N/A   Other:   N/A   Substance Abuse/Use  None   SBIRT completed (please refer for detailed history):  N  Self-reported substance use:   Patient denies all substance use.   Urinary Drug Screen Completed:  N Alcohol level:   N/A    Environmental/Housing/Living Arrangement  Stable housing   Who is in the home:   Girlfriend, friend   Emergency contact:  [name]   Patient's Strengths and Goals (patient's own words):   Patient reports girlfriend is supportive.   Clinical Social Worker's Interpretive Summary:   CSW received referral in order to complete psychosocial assessment. Per chart review, patient was evaluated by psych on 1/24  who recommended inpatient psych. Attending MD reports that patient is medically stable for transfer to [name]. CSW met with patient and girlfriend at bedside. CSW introduced myself and explained role.    Patient reports he has been in and out of the hospitals due to medical concerns. Patient reports he moved to [name] recently and has no support other than girlfriend. Patient has family in [name], [name] and father is in prison. Patient reports he is frustrated with care and stated that he was going to jump out of the  window in order get attention.    Patient very guarded and does not want to discuss depression or anxiety. Patient continues to report he does not want to hurt himself or others and just wants to DC. Patient reports he will not go to any groups but agreeable to individual counseling. Patient reports he has transportation and girlfriend will support as needed.    Psych MD re-evaluated and agreeable to outpatient follow up. CSW provided resources. Patient remains guarded and does not want to discuss his emotions with CSW.    CSW alerted RN and MD of DC plans. CSW is signing off but available if needed.   Disposition:  Outpatient referral made/needed   [name], [place] [phone]

## 2013-08-02 NOTE — Progress Notes (Signed)
NUTRITION FOLLOW UP  Intervention:   1. Provide Ensure Complete PO BID between meals 2. [employer] Instant Breakfast in the mornings  Nutrition Dx:   New nutrition dx: Increased nutrient needs related to compromised GI function as evidenced by history of poor PO intake, weight loss, and diagnosis of gastroenteritis.  Inadequate oral intake related to inability to eat as evidenced by NPO status. -Resolved  Goal:   Pt to meet >/= 90% of their estimated nutrition needs   Monitor:   PO intake, weights, labs  Assessment:   Patient is a 28 year old with past medical history of cholecystectomy and substance abuse. He presented to the ED secondary to nausea and vomiting. Further workup would reveal pneumomediastinum. Of which Gen. surgery and gastroenterology were subsequently consulted. Gastroenterology recommendations were:  consider in the future a gastric emptying study or possibly even MRCP to continue his GI workup  General surgeries was involved and did not recommend any surgical intervention.  -AKI (acute kidney injury) - resolved with fluids and supportive therapy.  -Barium swallow reported no evidence of esophageal perforation or leak  -Diet advanced and tolerated  - Will provide oxycodone IR on discharge for abdominal discomfort. Pt discharge is currently at question.   Pt currently reports a good appetite with meal completion ~75%. Pt denies any stomach pain or nausea especially after eating. Pt does not have any problems with his dysphagia 3 diet and presents with no complaints. Pt reports he wants [employer] Instant Breakfast in the mornings. Pt expresses concern about his weight loss and would like to gain weight. Pt wanted to try oral supplements (Boost, Ensure) as he reports consuming them in past and would like to drink them at home with or between meals. RD provided pt with a bottle of Ensure and Boost as pt would like to know which one he prefers better. RD also provided pt with  coupons for purchasing the nutritional shakes. Pt was educated on encouraging PO intake and the importance of not skipping meals as pt reports not usually eating breakfast.   Height: Ht Readings from Last 1 Encounters:  07/26/13 5\' 7"  (1.702 m)    Weight Status:   Wt Readings from Last 1 Encounters:  08/02/13 132 lb 4.4 oz (60 kg)    Re-estimated needs:  Kcal: 1800-2000 Protein: 70-80 grams Fluid: 2100 ml/day  Skin: WDL  Diet Order: Dysphagia 3 diet   Intake/Output Summary (Last 24 hours) at 08/02/13 1523 Last data filed at 08/02/13 1300  Gross per 24 hour  Intake    770 ml  Output   2220 ml  Net  -1450 ml    Last BM: 1/26   Labs:   Recent Labs Lab 07/26/13 1531 07/27/13 0510 07/31/13 1120  NA 140 142 142  K 4.5 4.1 4.1  CL 97 105 104  CO2 15* 21 26  BUN 16 16 3*  CREATININE 1.66* 0.99 1.10  CALCIUM 11.4* 8.5 9.2  MG  --   --  2.0  PHOS  --   --  3.4  GLUCOSE 141* 92 100*    CBG (last 3)  No results found for this basename: GLUCAP,  in the last 72 hours  Scheduled Meds: . feeding supplement (ENSURE COMPLETE)  237 mL Oral Once  . heparin  5,000 Units Subcutaneous Q8H  . lactose free nutrition  237 mL Oral Once  . pantoprazole  40 mg Oral BID  . piperacillin-tazobactam (ZOSYN)  IV  3.375 g Intravenous Q8H  . sodium  chloride  3 mL Intravenous Q12H    Continuous Infusions: . dextrose 5 % and 0.45 % NaCl with KCl 20 mEq/L 100 mL/hr at 08/01/13 1610    [facility] Dietetic Intern Pager: [phone]  Lloyd Huger MS RD LDN Clinical Dietitian Pager:[phone]

## 2013-08-02 NOTE — Progress Notes (Signed)
This patient is receiving Protonix. Based on criteria approved by the Pharmacy and Therapeutics Committee, this medication is being converted to the equivalent oral dose form. These criteria include:   . The patient is eating (either orally or per tube) and/or has been taking other orally administered medications for at least 24 hours.  . This patient has no evidence of active gastrointestinal bleeding or impaired GI absorption (gastrectomy, short bowel, patient on TNA or NPO).   If you have questions about this conversion, please contact the pharmacy department.  Berkley HarveyLegge, Adalai Perl Marshall, [facility] Va Medical CenterRPH 08/02/2013 1:46 PM

## 2013-08-19 ENCOUNTER — Ambulatory Visit: Payer: Self-pay | Attending: Internal Medicine | Admitting: Internal Medicine

## 2013-08-19 VITALS — BP 100/67 | HR 98 | Temp 98.5°F | Resp 16 | Ht 67.0 in | Wt 140.0 lb

## 2013-08-19 DIAGNOSIS — F329 Major depressive disorder, single episode, unspecified: Secondary | ICD-10-CM | POA: Insufficient documentation

## 2013-08-19 DIAGNOSIS — R112 Nausea with vomiting, unspecified: Secondary | ICD-10-CM

## 2013-08-19 DIAGNOSIS — F411 Generalized anxiety disorder: Secondary | ICD-10-CM | POA: Insufficient documentation

## 2013-08-19 DIAGNOSIS — F3289 Other specified depressive episodes: Secondary | ICD-10-CM | POA: Insufficient documentation

## 2013-08-19 DIAGNOSIS — R1115 Cyclical vomiting syndrome unrelated to migraine: Secondary | ICD-10-CM

## 2013-08-19 DIAGNOSIS — Z79899 Other long term (current) drug therapy: Secondary | ICD-10-CM | POA: Insufficient documentation

## 2013-08-19 LAB — CBC WITH DIFFERENTIAL/PLATELET
Basophils Absolute: 0 10*3/uL (ref 0.0–0.1)
Basophils Relative: 0 % (ref 0–1)
EOS PCT: 2 % (ref 0–5)
Eosinophils Absolute: 0.2 10*3/uL (ref 0.0–0.7)
HEMATOCRIT: 35.5 % — AB (ref 39.0–52.0)
HEMOGLOBIN: 11.7 g/dL — AB (ref 13.0–17.0)
LYMPHS ABS: 1.5 10*3/uL (ref 0.7–4.0)
Lymphocytes Relative: 16 % (ref 12–46)
MCH: 23.8 pg — AB (ref 26.0–34.0)
MCHC: 33 g/dL (ref 30.0–36.0)
MCV: 72.2 fL — AB (ref 78.0–100.0)
MONOS PCT: 5 % (ref 3–12)
Monocytes Absolute: 0.5 10*3/uL (ref 0.1–1.0)
Neutro Abs: 7.1 10*3/uL (ref 1.7–7.7)
Neutrophils Relative %: 77 % (ref 43–77)
Platelets: 346 10*3/uL (ref 150–400)
RBC: 4.92 MIL/uL (ref 4.22–5.81)
RDW: 18.8 % — ABNORMAL HIGH (ref 11.5–15.5)
WBC: 9.2 10*3/uL (ref 4.0–10.5)

## 2013-08-19 LAB — COMPLETE METABOLIC PANEL WITH GFR
ALK PHOS: 64 U/L (ref 39–117)
AST: 13 U/L (ref 0–37)
Albumin: 4.3 g/dL (ref 3.5–5.2)
BUN: 15 mg/dL (ref 6–23)
CO2: 29 meq/L (ref 19–32)
Calcium: 9.8 mg/dL (ref 8.4–10.5)
Chloride: 102 mEq/L (ref 96–112)
Creat: 0.88 mg/dL (ref 0.50–1.35)
GFR, Est African American: >89 mL/min
GFR, Est Non African American: >89 mL/min
Glucose, Bld: 95 mg/dL (ref 70–99)
Potassium: 4.6 mEq/L (ref 3.5–5.3)
Sodium: 138 mEq/L (ref 135–145)
TOTAL PROTEIN: 7.1 g/dL (ref 6.0–8.3)
Total Bilirubin: 0.3 mg/dL (ref 0.2–1.2)

## 2013-08-19 LAB — LIPID PANEL
CHOL/HDL RATIO: 3 ratio
Cholesterol: 142 mg/dL (ref 0–200)
HDL: 47 mg/dL (ref >39)
LDL Cholesterol: 81 mg/dL (ref 0–99)
Triglycerides: 68 mg/dL (ref <150)
VLDL: 14 mg/dL (ref 0–40)

## 2013-08-19 LAB — LIPASE: LIPASE: 13 U/L (ref 0–75)

## 2013-08-19 LAB — AMYLASE: Amylase: 53 U/L (ref 0–105)

## 2013-08-19 NOTE — Progress Notes (Signed)
Patient ID: Levi Palmer, male   DOB: 05/15/1986, 28 y.o.   MRN: [id number]   CC:  HPI:  28 year old male here to establish care. He has a history of cholecystectomy and substance abuse. Admitted on 1/20 for nausea vomiting and was found to have pneumomediastinum confirmed by CT scan. General surgery was consulted and no surgical intervention was made. The patient was also seen by gastroenterology and had a Barium swallow. The patient was recommended to have a gastric imaging study and possible MRCP. The patient continues to have nausea vomiting couple of episodes, nonbilious nonbloody. Denies any epigastric abdominal pain States that he has been compliant with all his medications Has had palpitations in a couple of occasions but no further episodes of nonsustained ventricular tachycardia  Currently taking Ativan for anxiety and depression, psychiatrist referral indicated No suicidal or homicidal ideation      Allergies  Allergen Reactions  . Zoloft [Sertraline Hcl]    Past Medical History  Diagnosis Date  . Medical history non-contributory    Current Outpatient Prescriptions on File Prior to Visit  Medication Sig Dispense Refill  . feeding supplement, ENSURE COMPLETE, (ENSURE COMPLETE) LIQD Take 237 mLs by mouth 2 (two) times daily between meals.  30 Bottle  0  . hydrOXYzine (ATARAX/VISTARIL) 25 MG tablet Take 1 tablet (25 mg total) by mouth every 6 (six) hours as needed for anxiety.  30 tablet  0  . LORazepam (ATIVAN) 1 MG tablet Take 1 tablet (1 mg total) by mouth every 6 (six) hours as needed for anxiety.  30 tablet  0  . oxycodone (OXY-IR) 5 MG capsule Take 1 capsule (5 mg total) by mouth every 4 (four) hours as needed for pain.  20 capsule  0  . pantoprazole (PROTONIX) 40 MG tablet Take 1 tablet (40 mg total) by mouth daily.  30 tablet  0   No current facility-administered medications on file prior to visit.   History reviewed. No pertinent family  history. History   Social History  . Marital Status: Single    Spouse Name: N/A    Number of Children: N/A  . Years of Education: N/A   Occupational History  . Not on file.   Social History Main Topics  . Smoking status: Never Smoker   . Smokeless tobacco: Never Used  . Alcohol Use: No  . Drug Use: Yes    Special: Marijuana     Comment: occasionally  . Sexual Activity: Not on file   Other Topics Concern  . Not on file   Social History Narrative  . No narrative on file    Review of Systems  Constitutional: Negative for fever, chills, diaphoresis, activity change, appetite change and fatigue.  HENT: Negative for ear pain, nosebleeds, congestion, facial swelling, rhinorrhea, neck pain, neck stiffness and ear discharge.   Eyes: Negative for pain, discharge, redness, itching and visual disturbance.  Respiratory: Negative for cough, choking, chest tightness, shortness of breath, wheezing and stridor.   Cardiovascular: Negative for chest pain, palpitations and leg swelling.  Gastrointestinal: As in history of present illness  Genitourinary: Negative for dysuria, urgency, frequency, hematuria, flank pain, decreased urine volume, difficulty urinating and dyspareunia.  Musculoskeletal: Negative for back pain, joint swelling, arthralgias and gait problem.  Neurological: Negative for dizziness, tremors, seizures, syncope, facial asymmetry, speech difficulty, weakness, light-headedness, numbness and headaches.  Hematological: Negative for adenopathy. Does not bruise/bleed easily.  Psychiatric/Behavioral: Negative for hallucinations, behavioral problems, confusion, dysphoric mood, decreased concentration and agitation.  Objective:   Filed Vitals:   08/19/13 1127  BP: 100/67  Pulse: 98  Temp: 98.5 F (36.9 C)  Resp: 16    Physical Exam  Constitutional: Appears well-developed and well-nourished. No distress.  HENT: Normocephalic. External right and left ear normal. Oropharynx  is clear and moist.  Eyes: Conjunctivae and EOM are normal. PERRLA, no scleral icterus.  Neck: Normal ROM. Neck supple. No JVD. No tracheal deviation. No thyromegaly.  CVS: RRR, S1/S2 +, no murmurs, no gallops, no carotid bruit.  Pulmonary: Effort and breath sounds normal, no stridor, rhonchi, wheezes, rales.  Abdominal: Soft. BS +,  no distension, tenderness, rebound or guarding.  Musculoskeletal: Normal range of motion. No edema and no tenderness.  Lymphadenopathy: No lymphadenopathy noted, cervical, inguinal. Neuro: Alert. Normal reflexes, muscle tone coordination. No cranial nerve deficit. Skin: Skin is warm and dry. No rash noted. Not diaphoretic. No erythema. No pallor.  Psychiatric: Normal mood and affect. Behavior, judgment, thought content normal.   Lab Results  Component Value Date   WBC 8.1 07/28/2013   HGB 10.0* 07/28/2013   HCT 32.0* 07/28/2013   MCV 73.6* 07/28/2013   PLT 209 07/28/2013   Lab Results  Component Value Date   CREATININE 1.10 07/31/2013   BUN 3* 07/31/2013   NA 142 07/31/2013   K 4.1 07/31/2013   CL 104 07/31/2013   CO2 26 07/31/2013    No results found for this basename: HGBA1C   Lipid Panel  No results found for this basename: chol, trig, hdl, cholhdl, vldl, ldlcalc       Assessment and plan:   Patient Active Problem List   Diagnosis Date Noted  . Non-sustained ventricular tachycardia 07/31/2013  . Depression 07/30/2013  . Anxiety 07/30/2013  . Pneumomediastinum 07/26/2013  . suspected Esophageal perforation 07/26/2013  . Gastroenteritis 07/26/2013  . AKI (acute kidney injury) 07/26/2013  . Vomiting 06/13/2013  . Chest wall muscle strain 06/13/2013  . Pain, dental 06/13/2013   Intractable nausea vomiting unClear etiology We'll order a gastric imaging study Recheck amylase, lipase, CMP Negative CT abdomen pelvis We'll obtain a urine drug screen Gastroenterology referral for possible EGD Continue PPI   Anxiety depression Psychiatry  referral  History of pneumomediastinum Repeat chest x-ray      Follow up in 3 months  The patient was given clear instructions to go to ER or return to medical center if symptoms don't improve, worsen or new problems develop. The patient verbalized understanding. The patient was told to call to get any lab results if not heard anything in the next week.

## 2013-08-19 NOTE — Progress Notes (Signed)
HFU Pt is still having nausea and abdomen pain.

## 2013-08-20 LAB — VITAMIN D 25 HYDROXY (VIT D DEFICIENCY, FRACTURES): VIT D 25 HYDROXY: 24 ng/mL — AB (ref 30–89)

## 2013-08-22 ENCOUNTER — Telehealth: Payer: Self-pay | Admitting: *Deleted

## 2013-08-22 ENCOUNTER — Ambulatory Visit: Payer: Self-pay

## 2013-08-22 NOTE — Telephone Encounter (Signed)
Message copied by Donja Tipping, [place] R on Mon Aug 22, 2013 12:16 PM ------      Message from: Susie CassetteABROL MD, [facility]      Created: Mon Aug 22, 2013 12:02 PM       Notify patient of her labs are normal except hemoglobin was 11.7. Vitamin D level is 24 therefore the patient needs to start taking vitamin D 2000 international units over-the-counter twice a day.      He has been scheduled for a gastric emptying study. He must complete this test before his next appointment ------

## 2013-08-22 NOTE — Telephone Encounter (Signed)
Mobile number is not in service. Tried contacting patient's home number and received no dial tone. Contacted patient with 3 attempts. Unable to reach patient.

## 2013-08-30 ENCOUNTER — Telehealth: Payer: Self-pay | Admitting: Emergency Medicine

## 2013-08-30 NOTE — Telephone Encounter (Signed)
Message copied by Darlis LoanSMITH, Koa Palla D on Tue Aug 30, 2013  5:33 PM ------      Message from: Susie CassetteABROL MD, [facility]      Created: Mon Aug 22, 2013 12:02 PM       Notify patient of her labs are normal except hemoglobin was 11.7. Vitamin D level is 24 therefore the patient needs to start taking vitamin D 2000 international units over-the-counter twice a day.      He has been scheduled for a gastric emptying study. He must complete this test before his next appointment ------

## 2013-08-30 NOTE — Telephone Encounter (Signed)
Pt given lab results. Pt needs gastric emptying study scheduled.

## 2013-11-21 ENCOUNTER — Ambulatory Visit: Payer: Self-pay | Admitting: Internal Medicine

## 2013-11-24 ENCOUNTER — Encounter (HOSPITAL_COMMUNITY): Payer: Self-pay | Admitting: Emergency Medicine

## 2013-11-24 ENCOUNTER — Emergency Department (HOSPITAL_COMMUNITY)
Admission: EM | Admit: 2013-11-24 | Discharge: 2013-11-25 | Disposition: A | Discharge status: Home / Self Care | Payer: Self-pay | Attending: Emergency Medicine | Admitting: Emergency Medicine

## 2013-11-24 ENCOUNTER — Emergency Department (HOSPITAL_COMMUNITY): Payer: Self-pay

## 2013-11-24 DIAGNOSIS — F419 Anxiety disorder, unspecified: Secondary | ICD-10-CM

## 2013-11-24 DIAGNOSIS — G8929 Other chronic pain: Secondary | ICD-10-CM | POA: Insufficient documentation

## 2013-11-24 DIAGNOSIS — F329 Major depressive disorder, single episode, unspecified: Secondary | ICD-10-CM

## 2013-11-24 DIAGNOSIS — Y9389 Activity, other specified: Secondary | ICD-10-CM | POA: Insufficient documentation

## 2013-11-24 DIAGNOSIS — R109 Unspecified abdominal pain: Secondary | ICD-10-CM

## 2013-11-24 DIAGNOSIS — R1013 Epigastric pain: Secondary | ICD-10-CM | POA: Insufficient documentation

## 2013-11-24 DIAGNOSIS — R45851 Suicidal ideations: Secondary | ICD-10-CM | POA: Insufficient documentation

## 2013-11-24 DIAGNOSIS — F410 Panic disorder [episodic paroxysmal anxiety] without agoraphobia: Secondary | ICD-10-CM | POA: Insufficient documentation

## 2013-11-24 DIAGNOSIS — S62619A Displaced fracture of proximal phalanx of unspecified finger, initial encounter for closed fracture: Secondary | ICD-10-CM

## 2013-11-24 DIAGNOSIS — R1110 Vomiting, unspecified: Secondary | ICD-10-CM | POA: Insufficient documentation

## 2013-11-24 DIAGNOSIS — W2209XA Striking against other stationary object, initial encounter: Secondary | ICD-10-CM | POA: Insufficient documentation

## 2013-11-24 DIAGNOSIS — F32A Depression, unspecified: Secondary | ICD-10-CM | POA: Diagnosis present

## 2013-11-24 DIAGNOSIS — Y929 Unspecified place or not applicable: Secondary | ICD-10-CM | POA: Insufficient documentation

## 2013-11-24 DIAGNOSIS — IMO0002 Reserved for concepts with insufficient information to code with codable children: Secondary | ICD-10-CM | POA: Insufficient documentation

## 2013-11-24 DIAGNOSIS — Z79899 Other long term (current) drug therapy: Secondary | ICD-10-CM | POA: Insufficient documentation

## 2013-11-24 HISTORY — DX: Panic disorder (episodic paroxysmal anxiety): F41.0

## 2013-11-24 HISTORY — DX: Acute stress reaction: F43.0

## 2013-11-24 HISTORY — DX: Anxiety disorder, unspecified: F41.9

## 2013-11-24 LAB — CBC WITH DIFFERENTIAL/PLATELET
BASOS ABS: 0 10*3/uL (ref 0.0–0.1)
BASOS PCT: 0 % (ref 0–1)
EOS PCT: 2 % (ref 0–5)
Eosinophils Absolute: 0.2 10*3/uL (ref 0.0–0.7)
HCT: 38.6 % — ABNORMAL LOW (ref 39.0–52.0)
Hemoglobin: 12.7 g/dL — ABNORMAL LOW (ref 13.0–17.0)
LYMPHS PCT: 19 % (ref 12–46)
Lymphs Abs: 1.9 10*3/uL (ref 0.7–4.0)
MCH: 24.3 pg — ABNORMAL LOW (ref 26.0–34.0)
MCHC: 32.9 g/dL (ref 30.0–36.0)
MCV: 73.9 fL — AB (ref 78.0–100.0)
Monocytes Absolute: 0.4 10*3/uL (ref 0.1–1.0)
Monocytes Relative: 4 % (ref 3–12)
Neutro Abs: 7.5 10*3/uL (ref 1.7–7.7)
Neutrophils Relative %: 75 % (ref 43–77)
PLATELETS: 320 10*3/uL (ref 150–400)
RBC: 5.22 MIL/uL (ref 4.22–5.81)
RDW: 16.1 % — AB (ref 11.5–15.5)
WBC: 10 10*3/uL (ref 4.0–10.5)

## 2013-11-24 LAB — COMPREHENSIVE METABOLIC PANEL
ALBUMIN: 4.4 g/dL (ref 3.5–5.2)
ALT: 9 U/L (ref 0–53)
AST: 14 U/L (ref 0–37)
Alkaline Phosphatase: 71 U/L (ref 39–117)
BUN: 20 mg/dL (ref 6–23)
CALCIUM: 9.9 mg/dL (ref 8.4–10.5)
CO2: 23 meq/L (ref 19–32)
Chloride: 101 mEq/L (ref 96–112)
Creatinine, Ser: 0.79 mg/dL (ref 0.50–1.35)
GFR calc Af Amer: >90 mL/min (ref >90)
Glucose, Bld: 95 mg/dL (ref 70–99)
Potassium: 3.6 mEq/L — ABNORMAL LOW (ref 3.7–5.3)
SODIUM: 141 meq/L (ref 137–147)
TOTAL PROTEIN: 8.2 g/dL (ref 6.0–8.3)
Total Bilirubin: 0.3 mg/dL (ref 0.3–1.2)

## 2013-11-24 LAB — ETHANOL

## 2013-11-24 LAB — LIPASE, BLOOD: Lipase: 22 U/L (ref 11–59)

## 2013-11-24 MED ORDER — LORAZEPAM 1 MG PO TABS: 1.0000 mg | ORAL_TABLET | Freq: Three times a day (TID) | ORAL | Status: DC | PRN

## 2013-11-24 MED ORDER — ONDANSETRON HCL 4 MG PO TABS: 4.0000 mg | ORAL_TABLET | Freq: Three times a day (TID) | ORAL | Status: DC | PRN

## 2013-11-24 MED ORDER — IBUPROFEN 200 MG PO TABS: 600.0000 mg | ORAL_TABLET | Freq: Three times a day (TID) | ORAL | Status: DC | PRN

## 2013-11-24 MED ORDER — ALUM & MAG HYDROXIDE-SIMETH 200-200-20 MG/5ML PO SUSP: 30.0000 mL | ORAL | Status: DC | PRN

## 2013-11-24 MED ORDER — BUPIVACAINE HCL (PF) 0.5 % IJ SOLN
10.0000 mL | Freq: Once | INTRAMUSCULAR | Status: AC
  Administered 2013-11-24: 10 mL
  Filled 2013-11-24: qty 30
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
## 2013-11-24 NOTE — ED Notes (Signed)
Asked pt if he could provide urine specimen. Pt adamantly refused. RN Farley LyEmily P made aware

## 2013-11-24 NOTE — ED Notes (Signed)
MD at bedside. 

## 2013-11-24 NOTE — ED Notes (Signed)
Patient informed of need for urine specimen Patient continues to be extremely rude, uncooperative and verbally aggressive towards nursing staff Patient's behavior previously documented--EDP and charge nurse is aware of issue

## 2013-11-25 ENCOUNTER — Encounter (HOSPITAL_COMMUNITY): Payer: Self-pay | Admitting: Psychiatry

## 2013-11-25 DIAGNOSIS — R45851 Suicidal ideations: Secondary | ICD-10-CM

## 2013-11-25 DIAGNOSIS — F410 Panic disorder [episodic paroxysmal anxiety] without agoraphobia: Secondary | ICD-10-CM | POA: Diagnosis present

## 2013-11-25 DIAGNOSIS — R1115 Cyclical vomiting syndrome unrelated to migraine: Secondary | ICD-10-CM | POA: Insufficient documentation

## 2013-11-25 DIAGNOSIS — F411 Generalized anxiety disorder: Secondary | ICD-10-CM

## 2013-11-25 LAB — URINALYSIS, ROUTINE W REFLEX MICROSCOPIC
BILIRUBIN URINE: NEGATIVE
GLUCOSE, UA: NEGATIVE mg/dL
Ketones, ur: NEGATIVE mg/dL
Leukocytes, UA: NEGATIVE
Nitrite: NEGATIVE
PROTEIN: NEGATIVE mg/dL
Specific Gravity, Urine: 1.018 (ref 1.005–1.030)
UROBILINOGEN UA: 0.2 mg/dL (ref 0.0–1.0)
pH: 6 (ref 5.0–8.0)

## 2013-11-25 LAB — RAPID URINE DRUG SCREEN, HOSP PERFORMED
AMPHETAMINES: NOT DETECTED
BENZODIAZEPINES: POSITIVE — AB
Barbiturates: NOT DETECTED
COCAINE: NOT DETECTED
Opiates: NOT DETECTED
TETRAHYDROCANNABINOL: POSITIVE — AB

## 2013-11-25 LAB — URINE MICROSCOPIC-ADD ON

## 2013-11-25 MED ORDER — HYDROCODONE-ACETAMINOPHEN 5-325 MG PO TABS: 2.0000 | ORAL_TABLET | ORAL | Status: DC | PRN

## 2013-11-25 MED ORDER — HYDROCODONE-ACETAMINOPHEN 5-325 MG PO TABS
1.0000 | ORAL_TABLET | ORAL | Status: DC | PRN
  Administered 2013-11-25 (×3): 2 via ORAL
  Filled 2013-11-25 (×3): qty 2

## 2013-11-25 MED ORDER — HYDROXYZINE PAMOATE 25 MG PO CAPS: 25.0000 mg | ORAL_CAPSULE | Freq: Three times a day (TID) | ORAL | Status: DC | PRN

## 2013-11-25 MED ORDER — HYDROMORPHONE HCL PF 1 MG/ML IJ SOLN
1.0000 mg | Freq: Once | INTRAMUSCULAR | Status: AC
  Administered 2013-11-25: 1 mg via INTRAMUSCULAR
  Filled 2013-11-25: qty 1

## 2013-11-25 NOTE — ED Notes (Signed)
Pt's significant other came out to nurse's desk, stating that pt hit his splinted hand on the bedside table and now having increased pain.  Explained that not time before pt can have another dose of pain meds.

## 2013-11-25 NOTE — BH Assessment (Signed)
Tele Assessment Note   Levi Palmer is a 28 y.o. male who presents to [facility] with c/o anxiety.  Pt denies SI/HI/AVH.  Pt says she angry today because he lives with his girlfriend, her roommate and her child.  Pt says he's not been able to afford his anxiety medications because he's not working.  Pt says he had an appt with [facility] but was not able to sit in the waiting room because of his anxiety.  Pt punched a wall yesterday and broke his hand.  Pt was given referrals and d/c home.    Axis I: Anxiety Disorder NOS Axis II: Deferred Axis III:  Past Medical History  Diagnosis Date  . Medical history non-contributory   . Anxiety   . Panic attack as reaction to stress    Axis IV: economic problems, other psychosocial or environmental problems, problems related to social environment and problems with primary support group Axis V: 51-60 moderate symptoms  Past Medical History:  Past Medical History  Diagnosis Date  . Medical history non-contributory   . Anxiety   . Panic attack as reaction to stress     Past Surgical History  Procedure Laterality Date  . Cholecystectomy    . Tonsillectomy    . Incise and drain abcess      Family History: History reviewed. No pertinent family history.  Social History:  reports that he has never smoked. He has never used smokeless tobacco. He reports that he uses illicit drugs (Marijuana). He reports that he does not drink alcohol.  Additional Social History:  Alcohol / Drug Use Pain Medications: None  Prescriptions: None  Over the Counter: None  History of alcohol / drug use?: Yes Longest period of sobriety (when/how long): Uses marijuana on a weekly basis   CIWA: CIWA-Ar BP: 139/92 mmHg Pulse Rate: 75 COWS:    Allergies:  Allergies  Allergen Reactions  . Zoloft [Sertraline Hcl] Palpitations    Home Medications:  (Not in a hospital admission)  OB/GYN Status:  No LMP for male patient.  General Assessment Data Location of  Assessment: [facility] ED Is this a Tele or Face-to-Face Assessment?: Face-to-Face Is this an Initial Assessment or a Re-assessment for this encounter?: Initial Assessment Living Arrangements: Spouse/significant other;Children;Non-relatives/Friends (lives with girlfriend, her son and her roommate) Can pt return to current living arrangement?: Yes Admission Status: Voluntary Is patient capable of signing voluntary admission?: Yes Transfer from: Acute Hospital Referral Source: MD  Medical Screening Exam [facility] Walk-[facility] ONLY) Medical Exam completed: No Reason for MSE not completed: Other: (None )  [facility] Crisis Care Plan Living Arrangements: Spouse/significant other;Children;Non-relatives/Friends (lives with girlfriend, her son and her roommate) Name of Psychiatrist: None  Name of Therapist: None   Education Status Is patient currently in school?: No Current Grade: None  Highest grade of school patient has completed: None  Name of school: None Contact person: None   Risk to self Suicidal Ideation: No Suicidal Intent: No Is patient at risk for suicide?: No Suicidal Plan?: No Access to Means: No What has been your use of drugs/alcohol within the last 12 months?: Pt uses marijuana  Previous Attempts/Gestures: Yes How many times?: 1 Other Self Harm Risks: None  Triggers for Past Attempts: None known Intentional Self Injurious Behavior: None Family Suicide History: No Recent stressful life event(s):  (Issues with living arrangments ) Persecutory voices/beliefs?: No Depression: No Depression Symptoms:  (None reported ) Substance abuse history and/or treatment for substance abuse?: No Suicide prevention information given to non-admitted  patients: Not applicable  Risk to Others Homicidal Ideation: No Thoughts of Harm to Others: No Current Homicidal Intent: No Current Homicidal Plan: No Access to Homicidal Means: No Identified Victim: None  History of harm to others?: No Assessment of  Violence: None Noted Violent Behavior Description: None Does patient have access to weapons?: No Criminal Charges Pending?: No Does patient have a court date: No  Psychosis Hallucinations: None noted Delusions: None noted  Mental Status Report Appear/Hygiene:  (Appropriate ) Eye Contact: Good Motor Activity: Unremarkable Speech: Logical/coherent;Pressured Level of Consciousness: Alert Mood: Anxious Affect: Anxious Anxiety Level: Minimal Thought Processes: Coherent;Relevant Judgement: Unimpaired Orientation: Place;Person;Time;Situation Obsessive Compulsive Thoughts/Behaviors: None  Cognitive Functioning Concentration: Decreased IQ: Average Insight: Fair Impulse Control: Fair Appetite: Good Weight Loss: 0 Weight Gain: 0 Sleep: Decreased Total Hours of Sleep: 5 Vegetative Symptoms: None  ADLScreening [facility] Assessment Services) Patient's cognitive ability adequate to safely complete daily activities?: Yes Patient able to express need for assistance with ADLs?: Yes Independently performs ADLs?: Yes (appropriate for developmental age)  Prior Inpatient Therapy Prior Inpatient Therapy: No Prior Therapy Dates: None  Prior Therapy Facilty/Provider(s): None  Reason for Treatment: None   Prior Outpatient Therapy Prior Outpatient Therapy: No Prior Therapy Dates: None  Prior Therapy Facilty/Provider(s): None  Reason for Treatment: None   ADL Screening (condition at time of admission) Patient's cognitive ability adequate to safely complete daily activities?: Yes Is the patient deaf or have difficulty hearing?: No Does the patient have difficulty seeing, even when wearing glasses/contacts?: No Does the patient have difficulty concentrating, remembering, or making decisions?: No Patient able to express need for assistance with ADLs?: Yes Does the patient have difficulty dressing or bathing?: No Independently performs ADLs?: Yes (appropriate for developmental age) Does the  patient have difficulty walking or climbing stairs?: No Weakness of Legs: None Weakness of Arms/Hands: None  Home Assistive Devices/Equipment Home Assistive Devices/Equipment: None  Therapy Consults (therapy consults require a physician order) PT Evaluation Needed: No OT Evalulation Needed: No SLP Evaluation Needed: No Abuse/Neglect Assessment (Assessment to be complete while patient is alone) Physical Abuse: Denies Verbal Abuse: Denies Sexual Abuse: Denies Exploitation of patient/patient's resources: Denies Self-Neglect: Denies Values / Beliefs Cultural Requests During Hospitalization: None Spiritual Requests During Hospitalization: None Consults Spiritual Care Consult Needed: No Social Work Consult Needed: No Merchant navy officerAdvance Directives (For Healthcare) Advance Directive: Patient does not have advance directive;Patient would not like information Pre-existing out of facility DNR order (yellow form or pink MOST form): No Nutrition Screen- [facility] Patient's home diet: Regular  Additional Information 1:1 In Past 12 Months?: No CIRT Risk: No Elopement Risk: No Does patient have medical clearance?: Yes     Disposition:  Disposition Initial Assessment Completed for this Encounter: Yes Disposition of Patient: Outpatient treatment;Referred to [facility] ) Type of outpatient treatment: Adult [facility] ) Patient referred to: Outpatient clinic referral;Other (Comment) [facility] )  Murrell Reddeneresa C Jomar Denz 11/25/2013 7:15 AM

## 2013-11-25 NOTE — [facility] Suicide Risk Assessment (Signed)
Suicide Risk Assessment  Discharge Assessment     Demographic Factors:  Male, unemployed  Total Time spent with patient: 20 minutes  Psychiatric Specialty Exam:     Blood pressure 139/92, pulse 75, temperature 98.7 F (37.1 C), temperature source Oral, resp. rate 15, SpO2 99.00%.There is no weight on file to calculate BMI.  General Appearance: Casual  Eye Contact::  Good  Speech:  Normal Rate  Volume:  Normal  Mood:  Anxious  Affect:  Congruent  Thought Process:  Coherent  Orientation:  Full (Time, Place, and Person)  Thought Content:  WDL  Suicidal Thoughts:  No  Homicidal Thoughts:  No  Memory:  Immediate;   Good Recent;   Good Remote;   Good  Judgement:  Fair  Insight:  Fair  Psychomotor Activity:  Normal  Concentration:  Good  Recall:  Good  Fund of Knowledge:Good  Language: Good  Akathisia:  No  Handed:  Right  AIMS (if indicated):     Assets:  Communication Skills Desire for Improvement Housing Intimacy Leisure Time Physical Health Resilience Social Support Vocational/Educational but unemployed  Sleep:       Musculoskeletal: Strength & Muscle Tone: within normal limits Gait & Station: normal Patient leans: N/A   Mental Status Per Nursing Assessment::   On Admission:   Patient anxious and frustrated  Current Mental Status by Physician: NA  Loss Factors: NA  Historical Factors: Impulsivity  Risk Reduction Factors:   Sense of responsibility to family, Employed, Living with another person, especially a relative, Positive social support and Positive coping skills or problem solving skills  Continued Clinical Symptoms:  Anxiety, depression  Cognitive Features That Contribute To Risk:  None  Suicide Risk:  Minimal: No identifiable suicidal ideation.  Patients presenting with no risk factors but with morbid ruminations; may be classified as minimal risk based on the severity of the depressive symptoms  Discharge Diagnoses:   AXIS I:   Anxiety Disorder NOS and Panic Disorder AXIS II:  Deferred AXIS III:   Past Medical History  Diagnosis Date  . Medical history non-contributory   . Anxiety   . Panic attack as reaction to stress    AXIS IV:  other psychosocial or environmental problems AXIS V:  61-70 mild symptoms  Plan Of Care/Follow-up recommendations:  Activity:  as tolerated Diet:  low-sodium heart healthy deit  Is patient on multiple antipsychotic therapies at discharge:  No   Has Patient had three or more failed trials of antipsychotic monotherapy by history:  No  Recommended Plan for Multiple Antipsychotic Therapies: NA    Nanine Means, PMH-NP 11/25/2013, 11:22 AM

## 2013-11-25 NOTE — ED Notes (Signed)
Spoke with TTS. Stated they will be rounding soon and then pt will be ready for discharge.

## 2013-11-25 NOTE — Discharge Instructions (Signed)
Cast or Splint Care  Casts and splints support injured limbs and keep bones from moving while they heal. It is important to care for your cast or splint at home.   HOME CARE INSTRUCTIONS   Keep the cast or splint uncovered during the drying period. It can take 24 to 48 hours to dry if it is made of plaster. A fiberglass cast will dry in less than 1 hour.   Do not rest the cast on anything harder than a pillow for the first 24 hours.   Do not put weight on your injured limb or apply pressure to the cast until your health care provider gives you permission.   Keep the cast or splint dry. Wet casts or splints can lose their shape and may not support the limb as well. A wet cast that has lost its shape can also create harmful pressure on your skin when it dries. Also, wet skin can become infected.   Cover the cast or splint with a plastic bag when bathing or when out in the rain or snow. If the cast is on the trunk of the body, take sponge baths until the cast is removed.   If your cast does become wet, dry it with a towel or a blow dryer on the cool setting only.   Keep your cast or splint clean. Soiled casts may be wiped with a moistened cloth.   Do not place any hard or soft foreign objects under your cast or splint, such as cotton, toilet paper, lotion, or powder.   Do not try to scratch the skin under the cast with any object. The object could get stuck inside the cast. Also, scratching could lead to an infection. If itching is a problem, use a blow dryer on a cool setting to relieve discomfort.   Do not trim or cut your cast or remove padding from inside of it.   Exercise all joints next to the injury that are not immobilized by the cast or splint. For example, if you have a long leg cast, exercise the hip joint and toes. If you have an arm cast or splint, exercise the shoulder, elbow, thumb, and fingers.   Elevate your injured arm or leg on 1 or 2 pillows for the first 1 to 3 days to decrease  swelling and pain.It is best if you can comfortably elevate your cast so it is higher than your heart.  SEEK MEDICAL CARE IF:    Your cast or splint cracks.   Your cast or splint is too tight or too loose.   You have unbearable itching inside the cast.   Your cast becomes wet or develops a soft spot or area.   You have a bad smell coming from inside your cast.   You get an object stuck under your cast.   Your skin around the cast becomes red or raw.   You have new pain or worsening pain after the cast has been applied.  SEEK IMMEDIATE MEDICAL CARE IF:    You have fluid leaking through the cast.   You are unable to move your fingers or toes.   You have discolored (blue or white), cool, painful, or very swollen fingers or toes beyond the cast.   You have tingling or numbness around the injured area.   You have severe pain or pressure under the cast.   You have any difficulty with your breathing or have shortness of breath.   You have chest   pain.  Document Released: 06/20/2000 Document Revised: 04/13/2013 Document Reviewed: 12/30/2012  [employer] Patient Information 2014 [employer]

## 2013-11-25 NOTE — Consult Note (Signed)
Patient seen, evaluated by me. Treatment plan formulated by me. Patient seems to have an anxiety disorder, most likely a panic disorder and needs outpatient treatment. Vistaril 25 mg every 6 hours when necessary was prescribed for anxiety and agitation. Patient states that he will go to [facility] next week for outpatient treatment

## 2013-11-25 NOTE — ED Provider Notes (Signed)
Psych has evaluated patient and feel he is stable for discharge. The patient is mostly having issues with anxiety as well as breaking his hand. Will prescribe Norco for pain and will refer him to hand surgery (Dr. Izora Ribas). Patient will go back to [facility]. No SI/HI  Audree Camel, MD 11/25/13 [phone]

## 2013-11-25 NOTE — Consult Note (Signed)
[facility] Face-to-Face Psychiatry Consult   Reason for Consult:  Anxiety Referring Physician:  EDP  Levi Palmer is an 28 y.o. male. Total Time spent with patient: 20 minutes  Assessment: AXIS I:  Anxiety Disorder NOS and Panic Disorder AXIS II:  Deferred AXIS III:   Past Medical History  Diagnosis Date  . Medical history non-contributory   . Anxiety   . Panic attack as reaction to stress    AXIS IV:  economic problems, occupational problems and other psychosocial or environmental problems AXIS V:  61-70 mild symptoms  Plan:  No evidence of imminent risk to self or others at present.    Subjective:   Levi Palmer is a 28 y.o. male patient does not warrant admission.  HPI:  28 y.o. male who presents to [facility] with c/o anxiety. Pt denies SI/HI/AVH. Pt says she angry today because he lives with his girlfriend, her roommate and her child. Pt says he's not been able to afford his anxiety medications because he's not working. Pt says he had an appt with [facility] but was not able to sit in the waiting room because of his anxiety. Pt punched a wall yesterday and broke his hand. Pt was given referrals and d/c home with Vistaril 25 mg RX every six hours PRN for agitation.  HPI Elements:   Location:  generalized. Quality:  acute. Severity:  mild. Timing:  intermittent. Duration:  few months. Context:  stressors.  Past Psychiatric History: Past Medical History  Diagnosis Date  . Medical history non-contributory   . Anxiety   . Panic attack as reaction to stress     reports that he has never smoked. He has never used smokeless tobacco. He reports that he uses illicit drugs (Marijuana). He reports that he does not drink alcohol. History reviewed. No pertinent family history. Family History Substance Abuse: No Family Supports: No (None reported ) Living Arrangements: Spouse/significant other;Children;Non-relatives/Friends (lives with girlfriend, her son and her roommate) Can pt  return to current living arrangement?: Yes Abuse/Neglect [facility]) Physical Abuse: Denies Verbal Abuse: Denies Sexual Abuse: Denies Allergies:   Allergies  Allergen Reactions  . Zoloft [Sertraline Hcl] Palpitations    ACT Assessment Complete:  Yes:    Educational Status    Risk to Self: Risk to self Suicidal Ideation: No Suicidal Intent: No Is patient at risk for suicide?: No Suicidal Plan?: No Access to Means: No What has been your use of drugs/alcohol within the last 12 months?: Pt uses marijuana  Previous Attempts/Gestures: Yes How many times?: 1 Other Self Harm Risks: None  Triggers for Past Attempts: None known Intentional Self Injurious Behavior: None Family Suicide History: No Recent stressful life event(s):  (Issues with living arrangments ) Persecutory voices/beliefs?: No Depression: No Depression Symptoms:  (None reported ) Substance abuse history and/or treatment for substance abuse?: No Suicide prevention information given to non-admitted patients: Not applicable  Risk to Others: Risk to Others Homicidal Ideation: No Thoughts of Harm to Others: No Current Homicidal Intent: No Current Homicidal Plan: No Access to Homicidal Means: No Identified Victim: None  History of harm to others?: No Assessment of Violence: None Noted Violent Behavior Description: None Does patient have access to weapons?: No Criminal Charges Pending?: No Does patient have a court date: No  Abuse: Abuse/Neglect Assessment (Assessment to be complete while patient is alone) Physical Abuse: Denies Verbal Abuse: Denies Sexual Abuse: Denies Exploitation of patient/patient's resources: Denies Self-Neglect: Denies  Prior Inpatient Therapy: Prior Inpatient Therapy Prior Inpatient Therapy:  No Prior Therapy Dates: None  Prior Therapy Facilty/Provider(s): None  Reason for Treatment: None   Prior Outpatient Therapy: Prior Outpatient Therapy Prior Outpatient Therapy: No Prior Therapy Dates:  None  Prior Therapy Facilty/Provider(s): None  Reason for Treatment: None   Additional Information: Additional Information 1:1 In Past 12 Months?: No CIRT Risk: No Elopement Risk: No Does patient have medical clearance?: Yes                  Objective: Blood pressure 142/86, pulse 78, temperature 98.6 F (37 C), temperature source Oral, resp. rate 16, SpO2 98.00%.There is no weight on file to calculate BMI. Results for orders placed during the hospital encounter of 11/24/13 (from the past 72 hour(s))  CBC WITH DIFFERENTIAL     Status: Abnormal   Collection Time    11/24/13 10:00 PM      Result Value Ref Range   WBC 10.0  4.0 - 10.5 K/uL   RBC 5.22  4.22 - 5.81 MIL/uL   Hemoglobin 12.7 (*) 13.0 - 17.0 g/dL   HCT 38.6 (*) 39.0 - 52.0 %   MCV 73.9 (*) 78.0 - 100.0 fL   MCH 24.3 (*) 26.0 - 34.0 pg   MCHC 32.9  30.0 - 36.0 g/dL   RDW 16.1 (*) 11.5 - 15.5 %   Platelets 320  150 - 400 K/uL   Neutrophils Relative % 75  43 - 77 %   Neutro Abs 7.5  1.7 - 7.7 K/uL   Lymphocytes Relative 19  12 - 46 %   Lymphs Abs 1.9  0.7 - 4.0 K/uL   Monocytes Relative 4  3 - 12 %   Monocytes Absolute 0.4  0.1 - 1.0 K/uL   Eosinophils Relative 2  0 - 5 %   Eosinophils Absolute 0.2  0.0 - 0.7 K/uL   Basophils Relative 0  0 - 1 %   Basophils Absolute 0.0  0.0 - 0.1 K/uL  COMPREHENSIVE METABOLIC PANEL     Status: Abnormal   Collection Time    11/24/13 10:00 PM      Result Value Ref Range   Sodium 141  137 - 147 mEq/L   Potassium 3.6 (*) 3.7 - 5.3 mEq/L   Chloride 101  96 - 112 mEq/L   CO2 23  19 - 32 mEq/L   Glucose, Bld 95  70 - 99 mg/dL   BUN 20  6 - 23 mg/dL   Creatinine, Ser 0.79  0.50 - 1.35 mg/dL   Calcium 9.9  8.4 - 10.5 mg/dL   Total Protein 8.2  6.0 - 8.3 g/dL   Albumin 4.4  3.5 - 5.2 g/dL   AST 14  0 - 37 U/L   ALT 9  0 - 53 U/L   Alkaline Phosphatase 71  39 - 117 U/L   Total Bilirubin 0.3  0.3 - 1.2 mg/dL   GFR calc non Af Amer >90  >90 mL/min   GFR calc Af Amer >90   >90 mL/min   Comment: (NOTE)     The eGFR has been calculated using the CKD EPI equation.     This calculation has not been validated in all clinical situations.     eGFR's persistently <90 mL/min signify possible Chronic Kidney     Disease.  ETHANOL     Status: None   Collection Time    11/24/13 10:00 PM      Result Value Ref Range   Alcohol, Ethyl (B) <11  0 - 11 mg/dL   Comment:            LOWEST DETECTABLE LIMIT FOR     SERUM ALCOHOL IS 11 mg/dL     FOR MEDICAL PURPOSES ONLY  LIPASE, BLOOD     Status: None   Collection Time    11/24/13 10:00 PM      Result Value Ref Range   Lipase 22  11 - 59 U/L  URINE RAPID DRUG SCREEN (HOSP PERFORMED)     Status: Abnormal   Collection Time    11/24/13 11:57 PM      Result Value Ref Range   Opiates NONE DETECTED  NONE DETECTED   Cocaine NONE DETECTED  NONE DETECTED   Benzodiazepines POSITIVE (*) NONE DETECTED   Amphetamines NONE DETECTED  NONE DETECTED   Tetrahydrocannabinol POSITIVE (*) NONE DETECTED   Barbiturates NONE DETECTED  NONE DETECTED   Comment:            DRUG SCREEN FOR MEDICAL PURPOSES     ONLY.  IF CONFIRMATION IS NEEDED     FOR ANY PURPOSE, NOTIFY LAB     WITHIN 5 DAYS.                LOWEST DETECTABLE LIMITS     FOR URINE DRUG SCREEN     Drug Class       Cutoff (ng/mL)     Amphetamine      1000     Barbiturate      200     Benzodiazepine   338     Tricyclics       250     Opiates          300     Cocaine          300     THC              50  URINALYSIS, ROUTINE W REFLEX MICROSCOPIC     Status: Abnormal   Collection Time    11/24/13 11:57 PM      Result Value Ref Range   Color, Urine YELLOW  YELLOW   APPearance TURBID (*) CLEAR   Specific Gravity, Urine 1.018  1.005 - 1.030   pH 6.0  5.0 - 8.0   Glucose, UA NEGATIVE  NEGATIVE mg/dL   Hgb urine dipstick TRACE (*) NEGATIVE   Bilirubin Urine NEGATIVE  NEGATIVE   Ketones, ur NEGATIVE  NEGATIVE mg/dL   Protein, ur NEGATIVE  NEGATIVE mg/dL   Urobilinogen, UA  0.2  0.0 - 1.0 mg/dL   Nitrite NEGATIVE  NEGATIVE   Leukocytes, UA NEGATIVE  NEGATIVE  URINE MICROSCOPIC-ADD ON     Status: Abnormal   Collection Time    11/24/13 11:57 PM      Result Value Ref Range   Squamous Epithelial / LPF RARE  RARE   WBC, UA 0-2  <3 WBC/hpf   RBC / HPF 3-6  <3 RBC/hpf   Bacteria, UA RARE  RARE   Casts GRANULAR CAST (*) NEGATIVE   Labs are reviewed and are pertinent for medical issues being addressed.  Current Facility-Administered Medications  Medication Dose Route Frequency Provider Last Rate Last Dose  . alum & mag hydroxide-simeth (MAALOX/MYLANTA) 200-200-20 MG/5ML suspension 30 mL  30 mL Oral PRN Dorie Rank, MD      . HYDROcodone-acetaminophen (NORCO/VICODIN) 5-325 MG per tablet 1-2 tablet  1-2 tablet Oral Q4H PRN Dorie Rank, MD   2 tablet at 11/25/13  59  . ibuprofen (ADVIL,MOTRIN) tablet 600 mg  600 mg Oral Q8H PRN Dorie Rank, MD      . LORazepam (ATIVAN) tablet 1 mg  1 mg Oral Q8H PRN Dorie Rank, MD      . ondansetron [facility]) tablet 4 mg  4 mg Oral Q8H PRN Dorie Rank, MD       Current Outpatient Prescriptions  Medication Sig Dispense Refill  . HYDROcodone-acetaminophen (NORCO) 5-325 MG per tablet Take 2 tablets by mouth every 4 (four) hours as needed.  20 tablet  0  . hydrOXYzine (VISTARIL) 25 MG capsule Take 1 capsule (25 mg total) by mouth 3 (three) times daily as needed for anxiety.  30 capsule  0   Psychiatric Specialty Exam:     Blood pressure 139/92, pulse 75, temperature 98.7 F (37.1 C), temperature source Oral, resp. rate 15, SpO2 99.00%.There is no weight on file to calculate BMI.  General Appearance: Casual  Eye Contact::  Good  Speech:  Normal Rate  Volume:  Normal  Mood:  Anxious  Affect:  Congruent  Thought Process:  Coherent  Orientation:  Full (Time, Place, and Person)  Thought Content:  WDL  Suicidal Thoughts:  No  Homicidal Thoughts:  No  Memory:  Immediate;   Good Recent;   Good Remote;   Good  Judgement:  Fair  Insight:   Fair  Psychomotor Activity:  Normal  Concentration:  Good  Recall:  Good  Fund of Knowledge:Good  Language: Good  Akathisia:  No  Handed:  Right  AIMS (if indicated):     Assets:  Communication Skills Desire for [place] but unemployed  Sleep:       Musculoskeletal: Strength & Muscle Tone: within normal limits Gait & Station: normal Patient leans: N/A  Treatment Plan Summary: Discharge home with follow-up at [facility] for his anxiety, Vistaril 25 mg every six hours PRN anxiety Rx given.  Waylan Boga, PMH-NP 11/25/2013 11:30 AM

## 2013-11-25 NOTE — Progress Notes (Signed)
[facility] provided pt with a list of primary care resources and a [facility] [employer] Card application to help patient establish primary care.  °

## 2013-12-06 ENCOUNTER — Emergency Department (HOSPITAL_COMMUNITY)
Admission: EM | Admit: 2013-12-06 | Discharge: 2013-12-07 | Disposition: A | Discharge status: Home / Self Care | Payer: Self-pay | Attending: Emergency Medicine | Admitting: Emergency Medicine

## 2013-12-06 ENCOUNTER — Encounter (HOSPITAL_COMMUNITY): Payer: Self-pay | Admitting: Emergency Medicine

## 2013-12-06 DIAGNOSIS — R112 Nausea with vomiting, unspecified: Secondary | ICD-10-CM | POA: Insufficient documentation

## 2013-12-06 DIAGNOSIS — D649 Anemia, unspecified: Secondary | ICD-10-CM | POA: Insufficient documentation

## 2013-12-06 DIAGNOSIS — R109 Unspecified abdominal pain: Secondary | ICD-10-CM | POA: Insufficient documentation

## 2013-12-06 DIAGNOSIS — R197 Diarrhea, unspecified: Secondary | ICD-10-CM | POA: Insufficient documentation

## 2013-12-06 DIAGNOSIS — E876 Hypokalemia: Secondary | ICD-10-CM | POA: Insufficient documentation

## 2013-12-06 DIAGNOSIS — R4589 Other symptoms and signs involving emotional state: Secondary | ICD-10-CM | POA: Insufficient documentation

## 2013-12-06 DIAGNOSIS — Z9089 Acquired absence of other organs: Secondary | ICD-10-CM | POA: Insufficient documentation

## 2013-12-06 LAB — CBC WITH DIFFERENTIAL/PLATELET
BASOS PCT: 1 % (ref 0–1)
Basophils Absolute: 0.1 10*3/uL (ref 0.0–0.1)
EOS ABS: 0.4 10*3/uL (ref 0.0–0.7)
EOS PCT: 5 % (ref 0–5)
HCT: 34 % — ABNORMAL LOW (ref 39.0–52.0)
HEMOGLOBIN: 11.6 g/dL — AB (ref 13.0–17.0)
LYMPHS ABS: 3 10*3/uL (ref 0.7–4.0)
Lymphocytes Relative: 35 % (ref 12–46)
MCH: 25.1 pg — AB (ref 26.0–34.0)
MCHC: 34.1 g/dL (ref 30.0–36.0)
MCV: 73.4 fL — AB (ref 78.0–100.0)
MONOS PCT: 8 % (ref 3–12)
Monocytes Absolute: 0.7 10*3/uL (ref 0.1–1.0)
Neutro Abs: 4.5 10*3/uL (ref 1.7–7.7)
Neutrophils Relative %: 51 % (ref 43–77)
Platelets: 298 10*3/uL (ref 150–400)
RBC: 4.63 MIL/uL (ref 4.22–5.81)
RDW: 15.7 % — ABNORMAL HIGH (ref 11.5–15.5)
WBC: 8.6 10*3/uL (ref 4.0–10.5)

## 2013-12-06 NOTE — ED Notes (Signed)
Pt states he started having emesis, diarrhea, weakness x4 days ago. Incidentally he recently broke his hand and is in a cast as well. Last BM today. Alert and oriented.

## 2013-12-07 LAB — URINALYSIS, ROUTINE W REFLEX MICROSCOPIC
Glucose, UA: NEGATIVE mg/dL
Hgb urine dipstick: NEGATIVE
Ketones, ur: NEGATIVE mg/dL
LEUKOCYTES UA: NEGATIVE
Nitrite: NEGATIVE
PH: 6 (ref 5.0–8.0)
Protein, ur: NEGATIVE mg/dL
Specific Gravity, Urine: 1.037 — ABNORMAL HIGH (ref 1.005–1.030)
Urobilinogen, UA: 1 mg/dL (ref 0.0–1.0)

## 2013-12-07 LAB — COMPREHENSIVE METABOLIC PANEL
ALK PHOS: 65 U/L (ref 39–117)
ALT: 21 U/L (ref 0–53)
AST: 73 U/L — ABNORMAL HIGH (ref 0–37)
Albumin: 3.7 g/dL (ref 3.5–5.2)
BUN: 21 mg/dL (ref 6–23)
CALCIUM: 9 mg/dL (ref 8.4–10.5)
CO2: 25 mEq/L (ref 19–32)
CREATININE: 0.85 mg/dL (ref 0.50–1.35)
Chloride: 100 mEq/L (ref 96–112)
GFR calc non Af Amer: >90 mL/min (ref >90)
GLUCOSE: 90 mg/dL (ref 70–99)
POTASSIUM: 3.4 meq/L — AB (ref 3.7–5.3)
Sodium: 138 mEq/L (ref 137–147)
Total Bilirubin: 0.3 mg/dL (ref 0.3–1.2)
Total Protein: 7.3 g/dL (ref 6.0–8.3)

## 2013-12-07 LAB — LIPASE, BLOOD: Lipase: 20 U/L (ref 11–59)

## 2013-12-07 MED ORDER — KETOROLAC TROMETHAMINE 30 MG/ML IJ SOLN
30.0000 mg | Freq: Once | INTRAMUSCULAR | Status: AC
  Administered 2013-12-07: 30 mg via INTRAVENOUS
  Filled 2013-12-07: qty 1

## 2013-12-07 MED ORDER — ONDANSETRON HCL 4 MG/2ML IJ SOLN
4.0000 mg | Freq: Once | INTRAMUSCULAR | Status: AC
  Administered 2013-12-07: 4 mg via INTRAVENOUS
  Filled 2013-12-07: qty 2

## 2013-12-07 MED ORDER — PROMETHAZINE HCL 25 MG PO TABS: 25.0000 mg | ORAL_TABLET | Freq: Four times a day (QID) | ORAL | Status: DC | PRN

## 2013-12-07 MED ORDER — SODIUM CHLORIDE 0.9 % IV BOLUS (SEPSIS)
1000.0000 mL | Freq: Once | INTRAVENOUS | Status: AC
  Administered 2013-12-07: 1000 mL via INTRAVENOUS

## 2013-12-07 MED ORDER — MORPHINE SULFATE 4 MG/ML IJ SOLN
4.0000 mg | Freq: Once | INTRAMUSCULAR | Status: AC
  Administered 2013-12-07: 4 mg via INTRAVENOUS
  Filled 2013-12-07: qty 1

## 2013-12-07 MED ORDER — POTASSIUM CHLORIDE CRYS ER 20 MEQ PO TBCR
40.0000 meq | EXTENDED_RELEASE_TABLET | Freq: Once | ORAL | Status: AC
  Administered 2013-12-07: 40 meq via ORAL
  Filled 2013-12-07: qty 2

## 2013-12-07 NOTE — ED Provider Notes (Signed)
CSN: [id number]     Arrival date & time 12/06/13  2212 History   First MD Initiated Contact with Patient 12/07/13 0215     Chief Complaint  Patient presents with  . Emesis  . Diarrhea   HPI  History provided by the patient. The patient is a 28 year old male with history of anxiety and panic attacks who presents with complaints of acute onset nausea, vomiting and diarrhea. Patient reports that he began having nausea, vomiting and watery diarrhea 4 days ago. He reports they're roommates in the house have also been sick with similar symptoms. States he has had very little to eat or drink since that time. He also complains of a headache. He did try taking some ibuprofen without any significant improvement. He is also complaining of some continued pains from her recent broken right hand 2 weeks ago. States that he has been upset and frustrated because he is to followup with orthopedic specialist but they have required him to pay he cannot afford this. He denies any weakness or numbness in the fingers. Denies any fever, chills or sweats. No other aggravating or alleviating factors. No other associated symptoms.     Past Medical History  Diagnosis Date  . Medical history non-contributory   . Anxiety   . Panic attack as reaction to stress    Past Surgical History  Procedure Laterality Date  . Cholecystectomy    . Tonsillectomy    . Incise and drain abcess     History reviewed. No pertinent family history. History  Substance Use Topics  . Smoking status: Never Smoker   . Smokeless tobacco: Never Used  . Alcohol Use: No    Review of Systems  Constitutional: Positive for appetite change. Negative for fever, chills and diaphoresis.  Gastrointestinal: Positive for nausea, vomiting, abdominal pain and diarrhea.  All other systems reviewed and are negative.     Allergies  Zoloft  Home Medications   Prior to Admission medications   Medication Sig Start Date End Date Taking? Authorizing  Provider  hydrOXYzine (VISTARIL) 25 MG capsule Take 1 capsule (25 mg total) by mouth 3 (three) times daily as needed for anxiety. 11/25/13  Yes Nanine Means, NP  ibuprofen (ADVIL,MOTRIN) 200 MG tablet Take 800 mg by mouth every 6 (six) hours as needed (pain).   Yes Historical Provider, MD   BP 107/69  Pulse 90  Temp(Src) 97.7 F (36.5 C) (Oral)  Resp 16  Ht 5\' 7"  (1.702 m)  Wt 135 lb (61.236 kg)  BMI 21.14 kg/m2  SpO2 98% Physical Exam  Nursing note and vitals reviewed. Constitutional: He is oriented to person, place, and time. He appears well-developed and well-nourished. No distress.  HENT:  Head: Normocephalic.  Cardiovascular: Normal rate and regular rhythm.   Pulmonary/Chest: Effort normal and breath sounds normal.  Abdominal: Soft. He exhibits no distension. There is tenderness in the epigastric area and left upper quadrant. There is no rebound, no guarding, no tenderness at McBurney's point and negative Murphy's sign.  Musculoskeletal: Normal range of motion.  Ulnar gutter splint applied to the right hand/arm  Neurological: He is alert and oriented to person, place, and time.  Skin: Skin is warm.  Psychiatric: He has a normal mood and affect. His behavior is normal.    ED Course  Procedures   COORDINATION OF CARE:  Nursing notes reviewed. Vital signs reviewed. Initial pt interview and examination performed.   Filed Vitals:   12/06/13 2237  BP: 107/69  Pulse: 90  Temp: 97.7 F (36.5 C)  TempSrc: Oral  Resp: 16  Height: 5\' 7"  (1.702 m)  Weight: 135 lb (61.236 kg)  SpO2: 98%    2:23 AM-patient seen and evaluated. Patient afebrile does not appear severely or toxic. Normal heart rate. Mild epigastric and left upper quadrant tenderness. No peritoneal signs.   Patient with slight anemia on labs. Also a slight hypokalemia. Potassium ordered. No other concerning findings. Normal WBC.  Patient reports feeling much better. He is tolerating by mouth fluids. Abdomen is  soft with only minimal discomfort. At this time feel patient may return home with continued symptomatic treatment. He has been instructed to follow up with his PCP.     Treatment plan initiated: Medications  sodium chloride 0.9 % bolus 1,000 mL (0 mLs Intravenous Stopped 12/07/13 0359)  ondansetron (ZOFRAN) injection 4 mg (4 mg Intravenous Given 12/07/13 0301)  morphine 4 MG/ML injection 4 mg (4 mg Intravenous Given 12/07/13 0301)  potassium chloride SA (K-DUR,KLOR-CON) CR tablet 40 mEq (40 mEq Oral Given 12/07/13 0301)  sodium chloride 0.9 % bolus 1,000 mL (1,000 mLs Intravenous New Bag/Given 12/07/13 0359)    Results for orders placed during the hospital encounter of 12/06/13  CBC WITH DIFFERENTIAL      Result Value Ref Range   WBC 8.6  4.0 - 10.5 K/uL   RBC 4.63  4.22 - 5.81 MIL/uL   Hemoglobin 11.6 (*) 13.0 - 17.0 g/dL   HCT 16.134.0 (*) 09.639.0 - 04.552.0 %   MCV 73.4 (*) 78.0 - 100.0 fL   MCH 25.1 (*) 26.0 - 34.0 pg   MCHC 34.1  30.0 - 36.0 g/dL   RDW 40.915.7 (*) 81.111.5 - 91.415.5 %   Platelets 298  150 - 400 K/uL   Neutrophils Relative % 51  43 - 77 %   Neutro Abs 4.5  1.7 - 7.7 K/uL   Lymphocytes Relative 35  12 - 46 %   Lymphs Abs 3.0  0.7 - 4.0 K/uL   Monocytes Relative 8  3 - 12 %   Monocytes Absolute 0.7  0.1 - 1.0 K/uL   Eosinophils Relative 5  0 - 5 %   Eosinophils Absolute 0.4  0.0 - 0.7 K/uL   Basophils Relative 1  0 - 1 %   Basophils Absolute 0.1  0.0 - 0.1 K/uL  COMPREHENSIVE METABOLIC PANEL      Result Value Ref Range   Sodium 138  137 - 147 mEq/L   Potassium 3.4 (*) 3.7 - 5.3 mEq/L   Chloride 100  96 - 112 mEq/L   CO2 25  19 - 32 mEq/L   Glucose, Bld 90  70 - 99 mg/dL   BUN 21  6 - 23 mg/dL   Creatinine, Ser 7.820.85  0.50 - 1.35 mg/dL   Calcium 9.0  8.4 - 95.610.5 mg/dL   Total Protein 7.3  6.0 - 8.3 g/dL   Albumin 3.7  3.5 - 5.2 g/dL   AST 73 (*) 0 - 37 U/L   ALT 21  0 - 53 U/L   Alkaline Phosphatase 65  39 - 117 U/L   Total Bilirubin 0.3  0.3 - 1.2 mg/dL   GFR calc non Af Amer  >90  >90 mL/min   GFR calc Af Amer >90  >90 mL/min  LIPASE, BLOOD      Result Value Ref Range   Lipase 20  11 - 59 U/L  URINALYSIS, ROUTINE W REFLEX MICROSCOPIC  Result Value Ref Range   Color, Urine YELLOW  YELLOW   APPearance CLOUDY (*) CLEAR   Specific Gravity, Urine 1.037 (*) 1.005 - 1.030   pH 6.0  5.0 - 8.0   Glucose, UA NEGATIVE  NEGATIVE mg/dL   Hgb urine dipstick NEGATIVE  NEGATIVE   Bilirubin Urine MODERATE (*) NEGATIVE   Ketones, ur NEGATIVE  NEGATIVE mg/dL   Protein, ur NEGATIVE  NEGATIVE mg/dL   Urobilinogen, UA 1.0  0.0 - 1.0 mg/dL   Nitrite NEGATIVE  NEGATIVE   Leukocytes, UA NEGATIVE  NEGATIVE          MDM   Final diagnoses:  Nausea vomiting and diarrhea  Anemia  Hypokalemia        Angus Seller, PA-C 12/07/13 [phone]

## 2013-12-07 NOTE — ED Provider Notes (Signed)
Medical screening examination/treatment/procedure(s) were performed by non-physician practitioner and as supervising physician I was immediately available for consultation/collaboration.   Maigen Mozingo, MD 12/07/13 0728 

## 2013-12-07 NOTE — ED Notes (Signed)
Pt unable to void. Will reattempt later.

## 2013-12-07 NOTE — Discharge Instructions (Signed)
You were seen and treated for your nausea, vomiting and diarrhea symptoms. Your laboratory testing has not shown any signs of an emergent concerning condition causing your symptoms. Continue to drink plenty of fluids to stay hydrated. Followup with your primary care provider for continued evaluation and treatment.    Nausea and Vomiting Nausea is a sick feeling that often comes before throwing up (vomiting). Vomiting is a reflex where stomach contents come out of your mouth. Vomiting can cause severe loss of body fluids (dehydration). Children and elderly adults can become dehydrated quickly, especially if they also have diarrhea. Nausea and vomiting are symptoms of a condition or disease. It is important to find the cause of your symptoms. CAUSES   Direct irritation of the stomach lining. This irritation can result from increased acid production (gastroesophageal reflux disease), infection, food poisoning, taking certain medicines (such as nonsteroidal anti-inflammatory drugs), alcohol use, or tobacco use.  Signals from the brain.These signals could be caused by a headache, heat exposure, an inner ear disturbance, increased pressure in the brain from injury, infection, a tumor, or a concussion, pain, emotional stimulus, or metabolic problems.  An obstruction in the gastrointestinal tract (bowel obstruction).  Illnesses such as diabetes, hepatitis, gallbladder problems, appendicitis, kidney problems, cancer, sepsis, atypical symptoms of a heart attack, or eating disorders.  Medical treatments such as chemotherapy and radiation.  Receiving medicine that makes you sleep (general anesthetic) during surgery. DIAGNOSIS Your caregiver may ask for tests to be done if the problems do not improve after a few days. Tests may also be done if symptoms are severe or if the reason for the nausea and vomiting is not clear. Tests may include:  Urine tests.  Blood tests.  Stool tests.  Cultures (to look  for evidence of infection).  X-rays or other imaging studies. Test results can help your caregiver make decisions about treatment or the need for additional tests. TREATMENT You need to stay well hydrated. Drink frequently but in small amounts.You may wish to drink water, sports drinks, clear broth, or eat frozen ice pops or gelatin dessert to help stay hydrated.When you eat, eating slowly may help prevent nausea.There are also some antinausea medicines that may help prevent nausea. HOME CARE INSTRUCTIONS   Take all medicine as directed by your caregiver.  If you do not have an appetite, do not force yourself to eat. However, you must continue to drink fluids.  If you have an appetite, eat a normal diet unless your caregiver tells you differently.  Eat a variety of complex carbohydrates (rice, wheat, potatoes, bread), lean meats, yogurt, fruits, and vegetables.  Avoid high-fat foods because they are more difficult to digest.  Drink enough water and fluids to keep your urine clear or pale yellow.  If you are dehydrated, ask your caregiver for specific rehydration instructions. Signs of dehydration may include:  Severe thirst.  Dry lips and mouth.  Dizziness.  Dark urine.  Decreasing urine frequency and amount.  Confusion.  Rapid breathing or pulse. SEEK IMMEDIATE MEDICAL CARE IF:   You have blood or brown flecks (like coffee grounds) in your vomit.  You have black or bloody stools.  You have a severe headache or stiff neck.  You are confused.  You have severe abdominal pain.  You have chest pain or trouble breathing.  You do not urinate at least once every 8 hours.  You develop cold or clammy skin.  You continue to vomit for longer than 24 to 48 hours.  You  have a fever. MAKE SURE YOU:   Understand these instructions.  Will watch your condition.  Will get help right away if you are not doing well or get worse. Document Released: 06/23/2005 Document  Revised: 09/15/2011 Document Reviewed: 11/20/2010 [facility] Patient Information 2014 [place], [place].     Anemia, Nonspecific Anemia is a condition in which the concentration of red blood cells or hemoglobin in the blood is below normal. Hemoglobin is a substance in red blood cells that carries oxygen to the tissues of the body. Anemia results in not enough oxygen reaching these tissues.  CAUSES  Common causes of anemia include:   Excessive bleeding. Bleeding may be internal or external. This includes excessive bleeding from periods (in women) or from the intestine.   Poor nutrition.   Chronic kidney, thyroid, and liver disease.  Bone marrow disorders that decrease red blood cell production.  Cancer and treatments for cancer.  HIV, AIDS, and their treatments.  Spleen problems that increase red blood cell destruction.  Blood disorders.  Excess destruction of red blood cells due to infection, medicines, and autoimmune disorders. SIGNS AND SYMPTOMS   Minor weakness.   Dizziness.   Headache.  Palpitations.   Shortness of breath, especially with exercise.   Paleness.  Cold sensitivity.  Indigestion.  Nausea.  Difficulty sleeping.  Difficulty concentrating. Symptoms may occur suddenly or they may develop slowly.  DIAGNOSIS  Additional blood tests are often needed. These help your health care provider determine the best treatment. Your health care provider will check your stool for blood and look for other causes of blood loss.  TREATMENT  Treatment varies depending on the cause of the anemia. Treatment can include:   Supplements of iron, vitamin B12, or folic acid.   Hormone medicines.   A blood transfusion. This may be needed if blood loss is severe.   Hospitalization. This may be needed if there is significant continual blood loss.   Dietary changes.  Spleen removal. HOME CARE INSTRUCTIONS Keep all follow-up appointments. It often takes many  weeks to correct anemia, and having your health care provider check on your condition and your response to treatment is very important. SEEK IMMEDIATE MEDICAL CARE IF:   You develop extreme weakness, shortness of breath, or chest pain.   You become dizzy or have trouble concentrating.  You develop heavy vaginal bleeding.   You develop a rash.   You have bloody or black, tarry stools.   You faint.   You vomit up blood.   You vomit repeatedly.   You have abdominal pain.  You have a fever or persistent symptoms for more than 2 3 days.   You have a fever and your symptoms suddenly get worse.   You are dehydrated.  MAKE SURE YOU:  Understand these instructions.  Will watch your condition.  Will get help right away if you are not doing well or get worse. Document Released: 07/31/2004 Document Revised: 02/23/2013 Document Reviewed: 12/17/2012 [facility] Patient Information 2014 [place], [place].

## 2013-12-07 NOTE — ED Notes (Signed)
Observed patient take down side rail and roll off bed into floor. Asked patient why he performed that act and patient stated " He has waited too long for the doctor" Informed patient that provider would be in to see him.

## 2013-12-09 ENCOUNTER — Emergency Department (HOSPITAL_COMMUNITY): Payer: No Typology Code available for payment source

## 2013-12-09 ENCOUNTER — Emergency Department (HOSPITAL_COMMUNITY)
Admission: EM | Admit: 2013-12-09 | Discharge: 2013-12-09 | Disposition: A | Discharge status: Home / Self Care | Payer: No Typology Code available for payment source | Attending: Emergency Medicine | Admitting: Emergency Medicine

## 2013-12-09 ENCOUNTER — Encounter (HOSPITAL_COMMUNITY): Payer: Self-pay | Admitting: Emergency Medicine

## 2013-12-09 DIAGNOSIS — F411 Generalized anxiety disorder: Secondary | ICD-10-CM | POA: Insufficient documentation

## 2013-12-09 DIAGNOSIS — S7010XA Contusion of unspecified thigh, initial encounter: Secondary | ICD-10-CM | POA: Insufficient documentation

## 2013-12-09 DIAGNOSIS — S7011XA Contusion of right thigh, initial encounter: Secondary | ICD-10-CM

## 2013-12-09 DIAGNOSIS — W06XXXA Fall from bed, initial encounter: Secondary | ICD-10-CM | POA: Insufficient documentation

## 2013-12-09 DIAGNOSIS — Y9389 Activity, other specified: Secondary | ICD-10-CM | POA: Insufficient documentation

## 2013-12-09 DIAGNOSIS — Y9210 Unspecified residential institution as the place of occurrence of the external cause: Secondary | ICD-10-CM | POA: Insufficient documentation

## 2013-12-09 MED ORDER — ACETAMINOPHEN 500 MG PO TABS
1000.0000 mg | ORAL_TABLET | Freq: Once | ORAL | Status: AC
  Administered 2013-12-09: 1000 mg via ORAL
  Filled 2013-12-09: qty 2

## 2013-12-09 MED ORDER — IBUPROFEN 800 MG PO TABS: 800.0000 mg | ORAL_TABLET | Freq: Three times a day (TID) | ORAL | Status: DC | PRN

## 2013-12-09 NOTE — ED Notes (Signed)
Pt reports he was seen in ED on 6/2 and "fell out of bed". See previous notes from that visit about fall. Pt reports since then he has had right thigh to groin "searing" 8/10 pain. Ambulatory with pain. Pt also asks if cast on right hand can be redressed.

## 2013-12-09 NOTE — ED Notes (Signed)
MD bedside

## 2013-12-09 NOTE — Discharge Instructions (Signed)

## 2013-12-09 NOTE — ED Notes (Addendum)
Pt reports R leg pain after falling out of bed when he was being seen in the ED on June 3rd. Pt c/o pain that has intensified since that fall. Pt is A&O and in NAD. Pt also requesting to have R hand splint redressed d/t being uncomfortable. Previous note from visit states that pt was observed taking down the side rail and purposely rolling off the bed because his "wait was too long."

## 2013-12-09 NOTE — ED Provider Notes (Signed)
CSN: [id number]     Arrival date & time 12/09/13  0721 History   First MD Initiated Contact with Patient 12/09/13 0732     Chief Complaint  Patient presents with  . Leg Pain  . cast redressed      (Consider location/radiation/quality/duration/timing/severity/associated sxs/prior Treatment) HPI 28 year old male presents with 2 days of right thigh pain status post a fall. He was in the emergency department for nausea and vomiting states that he fell out of bed. The patient states that he was lying in bed and there were no guard rails up and he was trying to get something off the table and fell out of bed. Since then he has had gradually worsening right eye pain. Describes an allover pain that is burning. He rates the pain as severe. He is able to ambulate but has to limp significantly. He's taken Motrin with no relief. The nurses notes from that visit indicates that the patient took down the guard rail and rolled out of bed, stating that he was tired of waiting for the doctor. The patient states is not true that he fell out of bed with no guard rails up. Patient denies any numbness or tingling. He is also asking to his right splint be replaced for a hand fracture suffered at the end of the day. He states that he was told to come back here for need to be changed. The plaster still intact but states that sometimes it seems to be going into the skin. Denies any new pain or swelling in the hand.  Past Medical History  Diagnosis Date  . Medical history non-contributory   . Anxiety   . Panic attack as reaction to stress    Past Surgical History  Procedure Laterality Date  . Cholecystectomy    . Tonsillectomy    . Incise and drain abcess     History reviewed. No pertinent family history. History  Substance Use Topics  . Smoking status: Never Smoker   . Smokeless tobacco: Never Used  . Alcohol Use: No    Review of Systems  Musculoskeletal: Positive for gait problem (limping) and myalgias.  Negative for joint swelling.  Neurological: Negative for weakness and numbness.  All other systems reviewed and are negative.     Allergies  Zoloft  Home Medications   Prior to Admission medications   Medication Sig Start Date End Date Taking? Authorizing Provider  hydrOXYzine (VISTARIL) 25 MG capsule Take 1 capsule (25 mg total) by mouth 3 (three) times daily as needed for anxiety. 11/25/13   Nanine Means, NP  ibuprofen (ADVIL,MOTRIN) 200 MG tablet Take 800 mg by mouth every 6 (six) hours as needed (pain).    Historical Provider, MD  promethazine (PHENERGAN) 25 MG tablet Take 1 tablet (25 mg total) by mouth every 6 (six) hours as needed for nausea. 12/07/13   Phill Mutter Dammen, PA-C   BP 142/95  Pulse 72  Temp(Src) 97.8 F (36.6 C) (Oral)  Resp 16  SpO2 100% Physical Exam  Nursing note and vitals reviewed. Constitutional: He is oriented to person, place, and time. He appears well-developed and well-nourished. No distress.  HENT:  Head: Normocephalic and atraumatic.  Right Ear: External ear normal.  Left Ear: External ear normal.  Nose: Nose normal.  Eyes: Right eye exhibits no discharge. Left eye exhibits no discharge.  Cardiovascular: Normal rate and intact distal pulses.   Pulses:      Dorsalis pedis pulses are 2+ on the right side, and 2+  on the left side.  Pulmonary/Chest: Effort normal.  Abdominal: He exhibits no distension.  Musculoskeletal:       Right knee: No tenderness found.       Right upper leg: He exhibits tenderness. He exhibits no swelling, no edema, no deformity and no laceration.       Right lower leg: He exhibits no tenderness.       Legs: Right hand in ulnar gutter splint. No swelling or signs of splint breakdown. RLE with diffuse, mild tenderness, no swelling, induration or erythema. No muscle spasms.   Neurological: He is alert and oriented to person, place, and time. He has normal strength. No sensory deficit.  Patient able to walk and has limp with RLE.  Able to fully bear weight and walk around room without assistance  Skin: Skin is warm and dry.    ED Course  Procedures (including critical care time) Labs Review Labs Reviewed - No data to display  Imaging Review Dg Femur Right  12/09/2013   CLINICAL DATA:  Pain status post trauma  EXAM: RIGHT FEMUR - 2 VIEW  COMPARISON:  None.  FINDINGS: There is no evidence of fracture or other focal bone lesions. Soft tissues are unremarkable.  IMPRESSION: Negative.   Electronically Signed   By: Salome HolmesHector  Cooper M.D.   On: 12/09/2013 08:11     EKG Interpretation None      MDM   Final diagnoses:  Contusion of right thigh    Patient with what appears to be thigh contusion after his fall 2 nights ago. No signs of swelling or infection. NV intact. Will treat with NSAIDs and tylenol and advise rest and ice prn. No fractures. No hip pain. Will have ortho tech redress splint, patient states he has follow up with his orthopedic on 6/8.    Audree CamelScott T Mayra Jolliffe, MD 12/09/13 [phone]

## 2013-12-18 ENCOUNTER — Emergency Department (HOSPITAL_COMMUNITY)
Admission: EM | Admit: 2013-12-18 | Discharge: 2013-12-18 | Disposition: A | Discharge status: Home / Self Care | Payer: Self-pay | Attending: Emergency Medicine | Admitting: Emergency Medicine

## 2013-12-18 ENCOUNTER — Emergency Department (HOSPITAL_COMMUNITY): Payer: Self-pay

## 2013-12-18 ENCOUNTER — Encounter (HOSPITAL_COMMUNITY): Payer: Self-pay | Admitting: Emergency Medicine

## 2013-12-18 DIAGNOSIS — K029 Dental caries, unspecified: Secondary | ICD-10-CM

## 2013-12-18 DIAGNOSIS — R197 Diarrhea, unspecified: Secondary | ICD-10-CM | POA: Insufficient documentation

## 2013-12-18 DIAGNOSIS — F410 Panic disorder [episodic paroxysmal anxiety] without agoraphobia: Secondary | ICD-10-CM | POA: Insufficient documentation

## 2013-12-18 DIAGNOSIS — Z791 Long term (current) use of non-steroidal anti-inflammatories (NSAID): Secondary | ICD-10-CM | POA: Insufficient documentation

## 2013-12-18 DIAGNOSIS — M79609 Pain in unspecified limb: Secondary | ICD-10-CM | POA: Insufficient documentation

## 2013-12-18 DIAGNOSIS — Z9089 Acquired absence of other organs: Secondary | ICD-10-CM | POA: Insufficient documentation

## 2013-12-18 DIAGNOSIS — R109 Unspecified abdominal pain: Secondary | ICD-10-CM

## 2013-12-18 DIAGNOSIS — R112 Nausea with vomiting, unspecified: Secondary | ICD-10-CM

## 2013-12-18 DIAGNOSIS — M79641 Pain in right hand: Secondary | ICD-10-CM

## 2013-12-18 LAB — CBC WITH DIFFERENTIAL/PLATELET
Basophils Absolute: 0 10*3/uL (ref 0.0–0.1)
Basophils Relative: 0 % (ref 0–1)
Eosinophils Absolute: 0.2 10*3/uL (ref 0.0–0.7)
Eosinophils Relative: 2 % (ref 0–5)
HCT: 38.6 % — ABNORMAL LOW (ref 39.0–52.0)
Hemoglobin: 12.5 g/dL — ABNORMAL LOW (ref 13.0–17.0)
Lymphocytes Relative: 18 % (ref 12–46)
Lymphs Abs: 1.7 10*3/uL (ref 0.7–4.0)
MCH: 24.6 pg — ABNORMAL LOW (ref 26.0–34.0)
MCHC: 32.4 g/dL (ref 30.0–36.0)
MCV: 76 fL — ABNORMAL LOW (ref 78.0–100.0)
Monocytes Absolute: 0.5 10*3/uL (ref 0.1–1.0)
Monocytes Relative: 5 % (ref 3–12)
Neutro Abs: 6.9 10*3/uL (ref 1.7–7.7)
Neutrophils Relative %: 75 % (ref 43–77)
Platelets: 342 10*3/uL (ref 150–400)
RBC: 5.08 MIL/uL (ref 4.22–5.81)
RDW: 16.3 % — ABNORMAL HIGH (ref 11.5–15.5)
WBC: 9.3 10*3/uL (ref 4.0–10.5)

## 2013-12-18 LAB — COMPREHENSIVE METABOLIC PANEL
ALT: 5 U/L (ref 0–53)
AST: 12 U/L (ref 0–37)
Albumin: 4.5 g/dL (ref 3.5–5.2)
Alkaline Phosphatase: 69 U/L (ref 39–117)
BUN: 20 mg/dL (ref 6–23)
CO2: 26 mEq/L (ref 19–32)
Calcium: 10 mg/dL (ref 8.4–10.5)
Chloride: 99 mEq/L (ref 96–112)
Creatinine, Ser: 0.84 mg/dL (ref 0.50–1.35)
GFR calc Af Amer: >90 mL/min (ref >90)
GFR calc non Af Amer: >90 mL/min (ref >90)
Glucose, Bld: 105 mg/dL — ABNORMAL HIGH (ref 70–99)
Potassium: 3.8 mEq/L (ref 3.7–5.3)
Sodium: 140 mEq/L (ref 137–147)
Total Bilirubin: 0.4 mg/dL (ref 0.3–1.2)
Total Protein: 8.4 g/dL — ABNORMAL HIGH (ref 6.0–8.3)

## 2013-12-18 LAB — URINALYSIS, ROUTINE W REFLEX MICROSCOPIC
Glucose, UA: NEGATIVE mg/dL
Ketones, ur: NEGATIVE mg/dL
Leukocytes, UA: NEGATIVE
Nitrite: NEGATIVE
Protein, ur: NEGATIVE mg/dL
Specific Gravity, Urine: 1.026 (ref 1.005–1.030)
Urobilinogen, UA: 0.2 mg/dL (ref 0.0–1.0)
pH: 5.5 (ref 5.0–8.0)

## 2013-12-18 LAB — URINE MICROSCOPIC-ADD ON

## 2013-12-18 LAB — LIPASE, BLOOD: Lipase: 22 U/L (ref 11–59)

## 2013-12-18 MED ORDER — MELOXICAM 7.5 MG PO TABS: 15.0000 mg | ORAL_TABLET | Freq: Every day | ORAL | Status: DC

## 2013-12-18 MED ORDER — KETOROLAC TROMETHAMINE 30 MG/ML IJ SOLN
30.0000 mg | Freq: Once | INTRAMUSCULAR | Status: AC
  Administered 2013-12-18: 30 mg via INTRAVENOUS
  Filled 2013-12-18: qty 1

## 2013-12-18 MED ORDER — SODIUM CHLORIDE 0.9 % IV BOLUS (SEPSIS)
1000.0000 mL | INTRAVENOUS | Status: AC
  Administered 2013-12-18: 1000 mL via INTRAVENOUS

## 2013-12-18 MED ORDER — TRAMADOL HCL 50 MG PO TABS: 50.0000 mg | ORAL_TABLET | Freq: Four times a day (QID) | ORAL | Status: DC | PRN

## 2013-12-18 MED ORDER — ONDANSETRON HCL 4 MG/2ML IJ SOLN
4.0000 mg | Freq: Once | INTRAMUSCULAR | Status: AC
  Administered 2013-12-18: 4 mg via INTRAVENOUS
  Filled 2013-12-18: qty 2

## 2013-12-18 MED ORDER — HYDROMORPHONE HCL PF 1 MG/ML IJ SOLN
1.0000 mg | Freq: Once | INTRAMUSCULAR | Status: AC
  Administered 2013-12-18: 1 mg via INTRAVENOUS
  Filled 2013-12-18: qty 1

## 2013-12-18 MED ORDER — PROMETHAZINE HCL 25 MG PO TABS: 25.0000 mg | ORAL_TABLET | Freq: Four times a day (QID) | ORAL | Status: DC | PRN

## 2013-12-18 MED ORDER — DICYCLOMINE HCL 10 MG/ML IM SOLN
20.0000 mg | Freq: Once | INTRAMUSCULAR | Status: AC
  Administered 2013-12-18: 20 mg via INTRAMUSCULAR
  Filled 2013-12-18: qty 2

## 2013-12-18 NOTE — ED Notes (Signed)
TTS at bedside. 

## 2013-12-18 NOTE — ED Notes (Signed)
Campos MD offered to speak w/ Pt should he call back.

## 2013-12-18 NOTE — ED Notes (Signed)
Security and PA at bedside.  Pt exhibiting verbally and physically aggressive behavior.  It is this RN's understanding that Pt has history of same.  Pt is now requesting to see a psychiatrist.  Pt is unhappy, because we have requested Security to remain at bedside.  PA has explained that Security will remain for Pt's safety.  This Consulting civil engineerCharge RN has chosen to [place] visitors, due to Pt's behaviors.

## 2013-12-18 NOTE — ED Notes (Signed)
Patient requesting to speak to MD. MD Rubin PayorPickering aware.

## 2013-12-18 NOTE — ED Notes (Signed)
Pt called back and before secretary could get [place] MD, the Pt stated "I'm going to kill myself and it's going to be on you" and hung up.  This RN will be calling Pt's local police department for a wellness check.

## 2013-12-18 NOTE — BH Assessment (Signed)
Tele Assessment Note   Levi Palmer is an 28 y.o. male that was assessed by CSW on this date via face to face after presenting to [facility] voluntarily.  Pt was up for discharge but requested to talk to TTS about issues he didn't want to talk to any of the other staff about.  Alaina, charge RN informed CSW that pt has been verbally abusive to staff and possibly slammed his right hand which was in a splint and reason why he presented to the ED.  Alaina states that pt than slammed the door and his girlfriend was told to wait in the waiting room while security and police sat with pt.  Pt only agreed to talk to [place] with girlfriend present and room door closed, stating it's none of securities business to hear his issues.  CSW met with pt and pt's girlfriend, Estill Bamberg at this time.  Pt presents calm and cooperative and tearful at times.  Pt explained that he just wants to be heard and helped.  Pt states that he has been struggling with depression since 2010 with the main stressor being all of the medical issues he has been having.  Pt states that he's scared he's going to die if he doesn't get the medical help he needs.  Pt states that he didn't slam his hand but bumped his foot on the bed rail and feels he is being misunderstood by the ED staff.  Pt states that he feels no one will help him because he doesn't have insurance or money.  Pt reports he did raise his voice at staff because he didn't feel he was getting the help he wanted.  Pt states that he's been on anti anxiety medications in the past but has not followed through with any outpatient services.  Pt states that he went to [facility] once but didn't wait and finish the process.  CSW supported pt by listening to his concerns and explained the process of getting established with [facility].  CSW explained it will be a process but pt has to complete it to get the help he is asking for.  Pt reports depressive symptoms of crying spells and feeling worthless.  Pt denies  SI/HI/AVH.  Pt denies alcohol or substance abuse, reporting occasional marijuana use.  No other issues reported by pt and pt states he is thankful CSW listened to him.  Pt plans to return home with girlfriend who appears supportive and follow up with [facility] for outpatient services. EDP Noland Fordyce consulted CSW and discharged pt with referral to outpatient.    Axis I: Mood Disorder NOS Axis II: Deferred Axis III:  Past Medical History  Diagnosis Date  . Medical history non-contributory   . Anxiety   . Panic attack as reaction to stress    Axis IV: economic problems, housing problems, occupational problems, other psychosocial or environmental problems, problems related to social environment and problems with access to health care services Axis V: 61-70 mild symptoms  Past Medical History:  Past Medical History  Diagnosis Date  . Medical history non-contributory   . Anxiety   . Panic attack as reaction to stress     Past Surgical History  Procedure Laterality Date  . Cholecystectomy    . Tonsillectomy    . Incise and drain abcess      Family History: History reviewed. No pertinent family history.  Social History:  reports that he has never smoked. He has never used smokeless tobacco. He reports that he  uses illicit drugs (Marijuana). He reports that he does not drink alcohol.  Additional Social History:  Alcohol / Drug Use Pain Medications: See MAR Prescriptions: See MAR Over the Counter: See MAR History of alcohol / drug use?: No history of alcohol / drug abuse Longest period of sobriety (when/how long): N/A  CIWA: CIWA-Ar BP: 163/89 mmHg (pt angry) Pulse Rate: 108 COWS:    Allergies:  Allergies  Allergen Reactions  . Zoloft [Sertraline Hcl] Palpitations    Home Medications:  (Not in a hospital admission)  OB/GYN Status:  No LMP for male patient.  General Assessment Data Location of Assessment: [facility] ED ACT Assessment: Yes Is this a Tele or Face-to-Face  Assessment?: Face-to-Face Is this an Initial Assessment or a Re-assessment for this encounter?: Initial Assessment Living Arrangements: Spouse/significant other;Children;Non-relatives/Friends Can pt return to current living arrangement?: Yes Admission Status: Voluntary Is patient capable of signing voluntary admission?: Yes Transfer from: Home Referral Source: Self/Family/Friend  Medical Screening Exam ([place]) Medical Exam completed: No Reason for MSE not completed: Other: (N/A)  [place] Crisis Care Plan Living Arrangements: Spouse/significant other;Children;Non-relatives/Friends Name of Psychiatrist: None  Name of Therapist: None   Education Status Is patient currently in school?: No Current Grade:  (N/A) Highest grade of school patient has completed:  (Unknown) Name of school:  (N/A) Contact person:  (N/A)  Risk to self Suicidal Ideation: No Suicidal Intent: No Is patient at risk for suicide?: No Suicidal Plan?: No Access to Means: No What has been your use of drugs/alcohol within the last 12 months?:  (occasional THC use) Previous Attempts/Gestures: Yes How many times?:  (1) Other Self Harm Risks:  (None) Triggers for Past Attempts: None known Intentional Self Injurious Behavior: None Family Suicide History: No Recent stressful life event(s): Financial Problems;Recent negative physical changes;Job Loss Persecutory voices/beliefs?: No Depression: Yes Depression Symptoms: Tearfulness;Fatigue;Feeling worthless/self pity;Feeling angry/irritable Substance abuse history and/or treatment for substance abuse?: No Suicide prevention information given to non-admitted patients: Not applicable  Risk to Others Homicidal Ideation: No Thoughts of Harm to Others: No Current Homicidal Intent: No Current Homicidal Plan: No Access to Homicidal Means: No Identified Victim:  (N/A) History of harm to others?: No Assessment of Violence: None Noted Violent Behavior  Description:  (N/A) Does patient have access to weapons?: No Criminal Charges Pending?: No Does patient have a court date: No  Psychosis Hallucinations: None noted Delusions: None noted  Mental Status Report Appear/Hygiene: Unremarkable Eye Contact: Good Motor Activity: Agitation Speech: Logical/coherent;Pressured Level of Consciousness: Alert;Irritable Mood: Worthless, low self-esteem;Sad;Irritable Affect: Sad;Irritable Anxiety Level: None Thought Processes: Coherent;Relevant Judgement: Unimpaired Orientation: Person;Place;Situation;Time;Appropriate for developmental age Obsessive Compulsive Thoughts/Behaviors: None  Cognitive Functioning Concentration: Normal Memory: Remote Intact;Recent Intact IQ: Average Insight: Good Impulse Control: Fair Appetite: Good Weight Loss:  (pt reports 40 lb loss) Weight Gain:  (0) Sleep: No Change Total Hours of Sleep:  (unknown) Vegetative Symptoms: None  ADLScreening [facility] Assessment Services) Patient's cognitive ability adequate to safely complete daily activities?: Yes  Prior Inpatient Therapy Prior Inpatient Therapy: No Prior Therapy Dates: None  Prior Therapy Facilty/Provider(s): None  Reason for Treatment: None   Prior Outpatient Therapy Prior Outpatient Therapy: No Prior Therapy Dates: None  Prior Therapy Facilty/Provider(s): None  Reason for Treatment: None   ADL Screening (condition at time of admission) Patient's cognitive ability adequate to safely complete daily activities?: Yes Is the patient deaf or have difficulty hearing?: No Does the patient have difficulty seeing, even when wearing glasses/contacts?: No Does the patient have difficulty concentrating,  remembering, or making decisions?: No Does the patient have difficulty dressing or bathing?: No Does the patient have difficulty walking or climbing stairs?: No Weakness of Legs: None Weakness of Arms/Hands: None  Home Assistive Devices/Equipment Home  Assistive Devices/Equipment: None  Therapy Consults (therapy consults require a physician order) PT Evaluation Needed: No OT Evalulation Needed: No SLP Evaluation Needed: No Abuse/Neglect Assessment (Assessment to be complete while patient is alone) Physical Abuse: Denies Verbal Abuse: Denies Sexual Abuse: Denies Exploitation of patient/patient's resources: Denies Self-Neglect: Denies Values / Beliefs Cultural Requests During Hospitalization: None Spiritual Requests During Hospitalization: None Consults Spiritual Care Consult Needed: No Social Work Consult Needed: No Regulatory affairs officer (For Healthcare) Advance Directive: Patient does not have advance directive Pre-existing out of facility DNR order (yellow form or pink MOST form): No Nutrition Screen- [facility] Patient's home diet: Regular  Additional Information 1:1 In Past 12 Months?: No CIRT Risk: No Elopement Risk: No Does patient have medical clearance?: Yes  Child/Adolescent Assessment Running Away Risk: Denies Bed-Wetting: Denies Destruction of Property: Denies Cruelty to Animals: Denies Stealing: Denies Rebellious/Defies Authority: Denies Satanic Involvement: Denies Science writer: Denies Problems at [employer]: Denies Gang Involvement: Denies  Disposition:  Disposition Initial Assessment Completed for this Encounter: Yes Disposition of Patient: Outpatient treatment Type of outpatient treatment: Adult Patient referred to: Other (Comment);Outpatient clinic referral [facility])  Dina Rich, Diego Cory 12/18/2013 11:55 AM

## 2013-12-18 NOTE — ED Provider Notes (Signed)
CSN: [id number]     Arrival date & time 12/18/13  [id number] History   First MD Initiated Contact with Patient 12/18/13 0741     Chief Complaint  Patient presents with  . Hand Pain  . Abdominal Pain     (Consider location/radiation/quality/duration/timing/severity/associated sxs/prior Treatment) HPI Pt is a 28yo male with hx of pneumomediastinum in Jan 2015, anxiety and panic attack as reaction to stress presenting to ED c/o right hand pain due to fractured 4th finger on 5/21 for which he has been unable to afford to f/u with specialist.  Right finger was fractured on 5/21 after he punched an ashtray. Denies new injuries.  Splint was replaced last time pt was seen in ED on 6/5 as he stated it was cutting into his skin at that time.  Pain in his hand is aching and throbbing, 8/10.  Denies numbness or tingling.  Pt is right hand dominant.   Pt also c/o abdominal pain associated with n/v/d that started 2 days ago.  Reports having 3 episodes of non-bloody, non-bilious emesis today, and 2 episodes of watery diarrhea w/o blood or mucous in stool.  Denies fevers. Abdominal pain is sharp, aching, throbbing in upper abdomen. Does not radiate to his back.  Denies drinking alcohol or hx of pancreatitis. Denies sick contacts or recent travel. Reports being seen in ED for similar symptoms about 2 weeks ago and reports having improved since then but states symptoms have returned. Nothing makes them better or worse.  Pt states he has had these issues for 5 years and no one can figure out why, even states he had his gallbladder removed w/o relief in symptoms.  Pt states he cannot afford to go to a specialist.     Pt also states his teeth hurt too much to eat anything. Denies being seen by a dentist because he cannot afford one.    Past Medical History  Diagnosis Date  . Medical history non-contributory   . Anxiety   . Panic attack as reaction to stress    Past Surgical History  Procedure Laterality Date  .  Cholecystectomy    . Tonsillectomy    . Incise and drain abcess     History reviewed. No pertinent family history. History  Substance Use Topics  . Smoking status: Never Smoker   . Smokeless tobacco: Never Used  . Alcohol Use: No    Review of Systems  Constitutional: Negative for fever, chills and fatigue.  HENT: Positive for dental problem. Negative for congestion, sore throat, trouble swallowing and voice change.   Respiratory: Positive for shortness of breath ( mild). Negative for cough, choking and chest tightness.   Cardiovascular: Negative for chest pain and palpitations.  Gastrointestinal: Positive for nausea, vomiting, abdominal pain and diarrhea. Negative for constipation and blood in stool.  Genitourinary: Positive for decreased urine volume. Negative for dysuria, urgency, frequency, hematuria, flank pain, discharge, penile swelling, scrotal swelling, penile pain and testicular pain.  Musculoskeletal: Negative for back pain and myalgias.  All other systems reviewed and are negative.     Allergies  Zoloft  Home Medications   Prior to Admission medications   Medication Sig Start Date End Date Taking? Authorizing Provider  ibuprofen (ADVIL,MOTRIN) 200 MG tablet Take 200-800 mg by mouth every 6 (six) hours as needed for moderate pain.    Yes Historical Provider, MD  Menthol, Topical Analgesic, (ICY HOT EX) Apply 1 application topically as needed (for muscle pain).   Yes Historical Provider, MD  promethazine (PHENERGAN) 25 MG tablet Take 1 tablet (25 mg total) by mouth every 6 (six) hours as needed for nausea. 12/07/13  Yes Peter S Dammen, PA-C  meloxicam (MOBIC) 7.5 MG tablet Take 2 tablets (15 mg total) by mouth daily. 12/18/13   Junius FinnerErin O'Malley, PA-C  promethazine (PHENERGAN) 25 MG tablet Take 1 tablet (25 mg total) by mouth every 6 (six) hours as needed for nausea or vomiting. 12/18/13   Junius FinnerErin O'Malley, PA-C  traMADol (ULTRAM) 50 MG tablet Take 1 tablet (50 mg total) by mouth  every 6 (six) hours as needed. 12/18/13   Junius FinnerErin O'Malley, PA-C   BP 163/89  Pulse 108  Temp(Src) 97.5 F (36.4 C) (Oral)  Resp 20  SpO2 100% Physical Exam  Nursing note and vitals reviewed. Constitutional: He appears well-developed and well-nourished.  Pt lying diagonal on exam bed, lying on stomach, texting on his phone. Appears agitated.   HENT:  Head: Normocephalic and atraumatic.  Eyes: Conjunctivae are normal. No scleral icterus.  Neck: Normal range of motion.  Cardiovascular: Normal rate, regular rhythm and normal heart sounds.   Pulmonary/Chest: Effort normal and breath sounds normal. No respiratory distress. He has no wheezes. He has no rales. He exhibits no tenderness.  Abdominal: Soft. Bowel sounds are normal. He exhibits no distension and no mass. There is tenderness. There is no rebound and no guarding.  Soft, non-distended, diffuse tenderness, worse in upper abdomen with guarding. No rebound or masses. No CVAT  Musculoskeletal: Normal range of motion.  Neurological: He is alert.  Skin: Skin is warm and dry.  Psychiatric: His speech is normal. Thought content normal. His affect is angry. He is agitated. He is not actively hallucinating. He expresses no homicidal and no suicidal ideation. He expresses no suicidal plans and no homicidal plans.    ED Course  Procedures (including critical care time) Labs Review Labs Reviewed  CBC WITH DIFFERENTIAL - Abnormal; Notable for the following:    Hemoglobin 12.5 (*)    HCT 38.6 (*)    MCV 76.0 (*)    MCH 24.6 (*)    RDW 16.3 (*)    All other components within normal limits  COMPREHENSIVE METABOLIC PANEL - Abnormal; Notable for the following:    Glucose, Bld 105 (*)    Total Protein 8.4 (*)    All other components within normal limits  URINALYSIS, ROUTINE W REFLEX MICROSCOPIC - Abnormal; Notable for the following:    Color, Urine AMBER (*)    APPearance CLOUDY (*)    Hgb urine dipstick SMALL (*)    Bilirubin Urine SMALL (*)     All other components within normal limits  LIPASE, BLOOD  URINE MICROSCOPIC-ADD ON    Imaging Review Dg Chest 2 View  12/18/2013   CLINICAL DATA:  Shortness of breath  EXAM: CHEST  2 VIEW  COMPARISON:  11/24/2013  FINDINGS: Cardiomediastinal silhouette is stable. No acute infiltrate or pleural effusion. No pulmonary edema. Bony thorax is unremarkable.  IMPRESSION: No active cardiopulmonary disease.   Electronically Signed   By: Natasha MeadLiviu  Pop M.D.   On: 12/18/2013 09:53     EKG Interpretation None      MDM   Final diagnoses:  Abdominal pain  Nausea vomiting and diarrhea  Right hand pain  Pain due to dental caries    Pt is a 28yo male with hx of anxiety, depression, right 4th finger fracture, and recurrent abdominal pain with n/v/d.  Pt c/o right hand pain, abdominal pain, n/v/d.  GI symptoms started 2 days ago. Pt also states he cannot afford to go to GI or hand specialist. Denies new injuries to right hand.  Right hand is wrapped in clean, dry splint.  Discussed with pt he still needed to allow finger to heal, it generally takes 4-6 weeks to heal, and discussed with pt there was no reason to remove his splint or repeat imaging at this time as there have been no new injuries since 5/21.  Case Management was requested and spoke with pt who explained to pt that [facility] would be able to refer him to specialists and help him with associated costs.  Pt has been argumentative since entering the ED.  Discussed calmly with pt that labs would be performed today for his GI symptoms as well as CXR due to diffuse wheeze and hx of pneumomediastinum.  Tx in ED: IV fluids, zofran, dilaudid, and bentyl.   Labs and imaging: unremarkable. Not concerned for appendicitis, pancreatitis, SBO, pneumomediastinum.  Reviewed medical records including a CT abd/pelis in Jan 2015 which showed the pneumomediastinum but was otherwise unremarkable. Symptoms either due to viral gastroenteritis or  exacerbation of chronic abdominal pain, possible IBS.  Discussed labs and imaging with pt. Pt stated his nausea and stomach pain had improved, however then c/o dental pain which was not improved with IV dilaudid pain medication.  Toradol given.  Pt advised to f/u with [facility].    Pt was verbally abusive and had aggressive tone with myself and RN who felt security needed to be called.  Pt was then discharged as he denied SI/HI.  No medical indication to IVC or keep pt for further workup.  Pt was discharged. Discharge papers printed.  During that time, pt requested to speak with TTS for his anxiety. Still denied SI/HI.    Pt spoke with TTS who provided him with information on mental health facilities which pt was already aware of.  Pt able to be discharged home as planned.    Dr. Rubin Payor was made aware of pt throughout his stay. Agrees with plan to f/u with [facility] [place] [facility].    Junius Finner, PA-C 12/18/13 1236

## 2013-12-18 NOTE — ED Notes (Signed)
[employer] has called and informed Off Duty Officer that they spoke w/ the Pt and he is now denying SI.  Sts "he was just upset."

## 2013-12-18 NOTE — ED Notes (Addendum)
Bed rails placed up on stretcher upon walking into room. Pt c/o abdominal pain nausea, vomiting, and diarrhea that started 2 days ago but recurrent x 5 years. Patient has a splint on right hand from previous injury. He states he is unable to follow up with specialists that have been provided to him because of ability to pay. Pt is angrily stating he is tired of going to doctors and nothing being diagnosed. Social worker/ Case Management consult offered to the patient by PA Denny PeonErin and this RN.

## 2013-12-18 NOTE — Progress Notes (Addendum)
CARE MANAGEMENT NOTE 12/18/2013  Patient:  Levi Palmer,Levi Palmer   Account Number:  [id number]  Date Initiated:  12/18/2013  Documentation initiated by:  Kurt G Vernon Md PaHAVIS,Sibel Khurana  Subjective/Objective Assessment:   abdominal pain     Action/Plan:   lives with girlfriend   Anticipated DC Date:  12/18/2013   Anticipated DC Plan:  HOME/SELF CARE      DC Planning Services  CM consult  [facility]      Choice offered to / List presented to:             Status of service:  Completed, signed off Medicare Important Message given?   (If response is "NO", the following Medicare IM given date fields will be blank) Date Medicare IM given:   Date Additional Medicare IM given:    Discharge Disposition:  HOME/SELF CARE  Per UR Regulation:    If discussed at Long Length of Stay Meetings, dates discussed:    Comments:  12/18/2013 1000 NCM spoke to  and states he has no finances at this time. States he unable to work. He did [facility] with [facility] the past. States he did not have a good experience with person drawing his labwork. Explained [facility] or he could try [facility] that is a income based clinic also. Pt states his girlfriend helps him with his finances but her income is limited. [facility] once he has arranged appt.  Isidoro DonningAlesia Ozelle Brubacher RN CCM Case Mgmt phone [phone]

## 2013-12-18 NOTE — ED Notes (Signed)
Pt asking if his hand can be evaluated  And splint be taken off. Will inform PA.

## 2013-12-18 NOTE — ED Notes (Signed)
Case Management at bedside.

## 2013-12-18 NOTE — ED Notes (Signed)
Erin PA at bedside 

## 2013-12-18 NOTE — ED Notes (Signed)
Patient requesting to speak with Psychiatry and states "I just need someone to talk to". Patient denies SI or HI.

## 2013-12-18 NOTE — ED Notes (Addendum)
This Consulting civil engineerCharge RN attempted to d/c Pt, per Pt request.  There was a misunderstanding that the EDP had seen Pt.  This RN asked EDP to see Pt and he obliged.  This RN followed EDP into room where he attempted to speak w/ Pt.  Pt proceeded to start complaining about being unable to pay for prescriptions and before the EDP or this RN could address that issue, he began complaining about his IV and stating that his nurse was "the worst nurse and should not be taking care of Pts."  EDP left room, because Pt would not allow us to speak.  This RN continued to try to d/c Pt and remove IV.  Pt became increasingly aggressive.  Security was called to bedside.  Pt continued to speak over this RN.  The only d/c teaching this RN could address was the Pt's need to follow up w/ a specialist and the [facility].  IV was removed and dressing applied.  Pt was asked to calm down and informed that his actions and tone of voice were aggressive.  Pt stated "if you want me to be threatening, I'll show you threatening."  Pt was immediately given d/c paperwork and Security escorted him out of the department.  Pt continued to yell and cuss at Security while escorting him out.

## 2013-12-18 NOTE — ED Notes (Addendum)
Pt hitting side rails of bed. Patient angry [facility]. Door was opened to monitor patient safety. Patient got up and slammed door. Security called to the bedside.

## 2013-12-18 NOTE — ED Notes (Signed)
Pt refused to speak w/ TTS w/o his girlfriend at bedside.  This RN informed the Pt that if he escalates, then she will be asked to leave again.  Pt verbalized understanding.    Security informed the Pt's girlfriend that should she exhibit aggressive behaviors or cause the Pt to escalate then she would be asked to leave.

## 2013-12-18 NOTE — ED Notes (Signed)
Off-duty [employer] officer asked to contact department to send officers for a Welfare check.  Officer given Pt's address.  Off-Duty Officer confirmed that officers will be sent.

## 2013-12-18 NOTE — ED Notes (Addendum)
Case Management [facility] and can pick up his medications there tomorrow at a discounted price, pt aware of this.

## 2013-12-18 NOTE — ED Notes (Signed)
Pt called department c/o hematoma to IV site.  Tim RN attempted to speak w/ Pt.  It is this RN's understanding that Pt began cussing and speaking over RN.  RN proceeded to hang up on Pt.

## 2013-12-18 NOTE — Discharge Instructions (Signed)
°Emergency Department Resource Guide °1) Find a Doctor and Pay Out of Pocket °Although you won't have to find out who is covered by your insurance plan, it is a good idea to ask around and get recommendations. You will then need to call the office and see if the doctor you have chosen will accept you as a new patient and what types of options they offer for patients who are self-pay. Some doctors offer discounts or will set up payment plans for their patients who do not have insurance, but you will need to ask so you aren't surprised when you get to your appointment. ° °2) Contact Your Local Health Department °Not all health departments have doctors that can see patients for sick visits, but many do, so it is worth a call to see if yours does. If you don't know where your local health department is, you can check in your phone book. The CDC also has a tool to help you locate your state's health department, and many state websites also have listings of all of their local health departments. ° °3) Find a Walk-in Clinic °If your illness is not likely to be very severe or complicated, you may want to try a walk in clinic. These are popping up all over the country in pharmacies, drugstores, and shopping centers. They're usually staffed by nurse practitioners or physician assistants that have been trained to treat common illnesses and complaints. They're usually fairly quick and inexpensive. However, if you have serious medical issues or chronic medical problems, these are probably not your best option. ° °No Primary Care Doctor: °- Call Health Connect at  832-8000 - they can help you locate a primary care doctor that  accepts your insurance, provides certain services, etc. °- Physician Referral Service- 1-800-533-3463 ° °Chronic Pain Problems: °Organization         Address  Phone   Notes  °[facility]  (336) 297-2271 Patients need to be referred by their primary care doctor.  ° °Medication  Assistance: °Organization         Address  Phone   Notes  °[place] [facility] [address] °[place], [place] [address] (336) 641-8030 --Must be a resident of [place] °-- Must have NO insurance coverage whatsoever (no Medicaid/ Medicare, etc.) °-- The pt. MUST have a primary care doctor that directs their care regularly and follows them in the community °  °[facility]  (866) 331-1348   °[employer]  (888) 892-1162   ° °Agencies that provide inexpensive medical care: °Organization         Address  Phone   Notes  °[facility]  (336) 832-8035   ° Internal Medicine    (336) 832-7272   °[facility] [address] °[place], [place] [address] (336) 832-4777   °[facility] [address], °[place] (336) 271-4999   °[facility]    (336) 373-0678   °[facility]    (336) 272-1050   °[facility] ° [address], [place] Phone:  (336) 832-4444, Fax:  (336) 832-4440 Hours of Operation:  9 am - 6 pm, M-F.  Also accepts Medicaid/Medicare and self-pay.  °[facility] ° [address], [place] Phone: (336) 832-3150, Fax: (336) 832-3151. Hours of Operation:  8:30 am - 5:30 pm, M-F.  Also accepts Medicaid and self-pay.  °[facility] [place] [address], [place] Phone: (336) 878-6027   °[facility] [address], [place], [place] (336)723-1848, Ext. 123 Mondays & Thursdays: 7-9 AM.  First 15 patients are seen on a first come, first serve basis. °  ° °Medicaid-accepting [place] Providers: ° °Organization         Address  Phone   Notes  °[facility] [address], [place] (336) 641-2100 Also accepts self-pay patients.  °[facility] [address], [place] ° (336) 856-9996   °[facility] [address], [place]  (336) 288-8857   °[facility] [address] [place] [address], [place] (336) 299-7000   °Veita Bland [address], [place]  ° (336) 373-1557 Only accepts [place] Access Medicaid patients after they have their name applied to their card.  ° °Self-Pay (no insurance) in [place]: ° °Organization         Address  Phone   Notes  °Sickle Cell Patients, [facility] [address], [place] (336) 832-1970   °[facility] [address], [place] (336) 832-4400   °[facility] [facility] ° [address], [place] (336) 992-4800   °[facility]/Dr. Osei-Bonsu ° [address] [place] [address], [place] or [address], [place] (336) 841-8500 Phone number for both [place] and [place] locations is the same.  °[facility] [address], [place] (336) 299-0000   °[facility] [address] [place] [address], [place] or [address] (336) 852-7530 °(336) 878-2260   °[facility] [address], [place] (336) 350-1642, phone; (336) 294-5005, fax Sees patients 1st and 3rd Saturday of every month.  Must not qualify for public or private insurance (i.e. Medicaid, Medicare, [place] Health Choice, Veterans' Benefits) • Household income should be no more than 200% of the poverty level •The clinic cannot treat you if you are pregnant or think you are pregnant • Sexually transmitted diseases are not treated at the clinic.  ° ° °Dental Care: °Organization         Address  Phone  Notes  °[place] [facility] [address], [place] (336) 641-6152 Accepts children up to age 21 who are enrolled in Medicaid or [place] Health Choice; pregnant women with a Medicaid card; and children who have applied for Medicaid or [name] Health Choice, but were declined, whose parents can pay a reduced fee at time of service.  °[place]  [facility] [place]  [address], [place] (336) 641-7733 Accepts children up to age 21 who are enrolled in Medicaid or [place] Health Choice; pregnant women with a Medicaid card; and children who have applied for Medicaid or [place] Health Choice, but were declined, whose parents can pay a reduced fee at time of service.  °[facility] ° [address], [place] (336) 641-4533 Patients are seen by appointment only. Walk-ins are not accepted. [facility] will see patients 18 years of age and older. °Monday - Tuesday (8am-5pm) °Most Wednesdays (8:30-5pm) °$30 per visit, cash only  °[facility] ° [address], [place] (336) 641-4533 Patients are seen by appointment only. Walk-ins are not accepted. [facility] will see patients 18 years of age and older. °One   Wednesday Evening (Monthly: Volunteer Based).  $30 per visit, cash only  °[facility]  (919) 537-3737 for adults; Children under age 4, call Graduate Pediatric Dentistry at (919) [phone]. Children aged 4-14, please call (919) 537-3737 to request a pediatric application. ° Dental services are provided in all areas of dental care including fillings, crowns and bridges, complete and partial dentures, implants, gum treatment, root canals, and extractions. Preventive care is also provided. Treatment is provided to both adults and children. °Patients are selected via a lottery and there is often a waiting list. °  °[facility] [address], °[place] ° (336) 763-8833 [email or web address] °  °[facility] [address], [place], [place] (336)723-1848, Ext. 123 Second and Fourth Thursday of each month, opens at 6:30 AM; Clinic ends at 9 AM.  Patients are seen on a first-come first-served basis, and a limited number are seen during each clinic.  ° °[facility] ° [address], [place], [place] (336) 723-7904    Eligibility Requirements °You must have lived in [place], [place], or [place] counties for at least the last three months. °  You cannot be eligible for state or federal sponsored healthcare insurance, including Veterans Administration, Medicaid, or Medicare. °  You generally cannot be eligible for healthcare insurance through your employer.  °  How to apply: °Eligibility screenings are held every Tuesday and Wednesday afternoon from 1:00 pm until 4:00 pm. You do not need an appointment for the interview!  °[facility] [address], [place], [place] 336-631-2330   °[place] [facility]  336-342-8273   °[place] [facility]  336-703-3100   °[facility]  336-570-6415   ° °Behavioral Health Resources in the Community: °Intensive Outpatient Programs °Organization         Address  Phone  Notes  °[facility] [address], [place], [place] 336-878-6098   °[facility] [address], [place], [place] 336-832-9800   °[facility] [address], [place], [place] ° 336-882-2125   °[facility] [address],  °[place], [place] 1-800-853-5163 or 336-641-4981   °Substance Abuse Resources °Organization         Address  Phone  Notes  °[facility]  336-882-2125   °[facility]  336-784-9470   °[facility]  336-285-9073   °[facility]  336-845-3988   °Residential & Outpatient Substance Abuse Program  1-800-659-3381   °Psychological Services °Organization         Address  Phone  Notes  °[facility]  336- 832-9600   °[facility]  336- 378-7881   °[facility] [address], [place] 1-800-853-5163 or 336-641-4981   ° °Mobile Crisis Teams °Organization         Address  Phone  Notes  °[facility], Mobile Crisis Care Unit  1-877-626-1772   °Assertive °[facility] ° [address]  [place], [place] 336-834-9664   °Sharon DeEsch [address] °[place] 336-554-5454   ° °Self-Help/Support Groups °Organization         Address  Phone             Notes  °[facility] [place] - variety of support groups  336- 373-1402 Call for more information  °Narcotics Anonymous (NA), [facility] [address], °[place]   2 meetings at this location  ° °  Residential Treatment Programs °Organization         Address  Phone  Notes  °[facility] [address],    °[place]  1-866-801-8205   °[facility] ° [address], [place], [place] 704-293-8524   °[facility] [address], [place] 336-845-3988 Admissions: 8am-3pm M-F  °[facility] [address]    °[place], [place] 336-841-1104   °[facility] [address], [place], [place] 336-379-7146   °[facility] [address]  °[place], [place] 336-285-9073   °[facility] Intensive Outpatient [address], [place], [place] 336-852-3033   °[facility] [address]  °[place], [place] 1-877-615-2722 or 336-784-9470   °[facility] [address] [place], [place] 336-227-7417 Accepts Medicaid  °[facility] [address]  °[place] 1-800-659-3381 Substance Abuse/Addiction Treatment  ° °[place] Behavioral Health Resources °Organization         Address  Phone  Notes  °[facility]  (888) 581-9988   °Julie Brannon, PhD [address] [place], [place]   (336) 349-5553 or (336) 951-0000   °[facility]   [address] °[place], [place] (336) 349-4454   °[facility] [address], [place], [place] (336) 342-8316 Insurance/Medicaid/sponsorship through [facility]  °[facility] [address]                                    [place], [place] (336) 342-8316 Therapy/tele-psych/case    °[facility] [address]  ° [place], [place] (336) 349-2233    °Dr. Arfeen  (336) 349-4544   °[facility] [place]  [employer] [place] [facility] 1) [address], [place] °2) [address], [place] °3)  [address], [place] (336) 349-3220 °(336) 342-7768 ° °(336) 342-8140   °[place] [facility] (336) 342-1394 or (336) 342-3537 (After Hours)    ° ° °
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
## 2013-12-18 NOTE — ED Notes (Signed)
Pt called back at 1410. Pt complaining about why we called [employer] to his house. I stated to him it was because he stated he was going to kill himself and that we were concerned for his well being.  He states that we didn't do anything for him and did not give a shit about him. Pt kept talking over me and I asked him what would he like me to do for him right now. Pt could not give me a direct answer.  I stated to him if he had any complaints or concerns he needed to talk to our director of the Emergency Department. I gave patient director Ophelia Shoulder(Karen Resh) name and number and told him to call on Monday.

## 2013-12-18 NOTE — ED Notes (Signed)
Case Management still at bedside.
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
## 2013-12-18 NOTE — ED Notes (Addendum)
Pt made aware that if he can exhibit calm behavior, then this Charge RN will revisit allowing his girlfriend come back.  Pt verbalized understanding.

## 2013-12-18 NOTE — ED Notes (Signed)
Pt transported to [facility]
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
## 2013-12-19 ENCOUNTER — Telehealth (HOSPITAL_COMMUNITY): Payer: Self-pay

## 2013-12-20 ENCOUNTER — Emergency Department (HOSPITAL_COMMUNITY): Payer: Self-pay

## 2013-12-20 ENCOUNTER — Encounter (HOSPITAL_COMMUNITY): Payer: Self-pay | Admitting: Emergency Medicine

## 2013-12-20 ENCOUNTER — Emergency Department (HOSPITAL_COMMUNITY)
Admission: EM | Admit: 2013-12-20 | Discharge: 2013-12-20 | Disposition: A | Discharge status: Home / Self Care | Payer: Self-pay | Attending: Emergency Medicine | Admitting: Emergency Medicine

## 2013-12-20 DIAGNOSIS — R079 Chest pain, unspecified: Secondary | ICD-10-CM | POA: Insufficient documentation

## 2013-12-20 DIAGNOSIS — G8929 Other chronic pain: Secondary | ICD-10-CM | POA: Insufficient documentation

## 2013-12-20 DIAGNOSIS — Y929 Unspecified place or not applicable: Secondary | ICD-10-CM | POA: Insufficient documentation

## 2013-12-20 DIAGNOSIS — F411 Generalized anxiety disorder: Secondary | ICD-10-CM | POA: Insufficient documentation

## 2013-12-20 DIAGNOSIS — R1011 Right upper quadrant pain: Secondary | ICD-10-CM | POA: Insufficient documentation

## 2013-12-20 DIAGNOSIS — Y9389 Activity, other specified: Secondary | ICD-10-CM | POA: Insufficient documentation

## 2013-12-20 DIAGNOSIS — K089 Disorder of teeth and supporting structures, unspecified: Secondary | ICD-10-CM | POA: Insufficient documentation

## 2013-12-20 DIAGNOSIS — Z791 Long term (current) use of non-steroidal anti-inflammatories (NSAID): Secondary | ICD-10-CM | POA: Insufficient documentation

## 2013-12-20 DIAGNOSIS — IMO0002 Reserved for concepts with insufficient information to code with codable children: Secondary | ICD-10-CM | POA: Insufficient documentation

## 2013-12-20 DIAGNOSIS — Z9089 Acquired absence of other organs: Secondary | ICD-10-CM | POA: Insufficient documentation

## 2013-12-20 DIAGNOSIS — IMO0001 Reserved for inherently not codable concepts without codable children: Secondary | ICD-10-CM

## 2013-12-20 DIAGNOSIS — Z79899 Other long term (current) drug therapy: Secondary | ICD-10-CM | POA: Insufficient documentation

## 2013-12-20 DIAGNOSIS — K0889 Other specified disorders of teeth and supporting structures: Secondary | ICD-10-CM

## 2013-12-20 DIAGNOSIS — R1013 Epigastric pain: Secondary | ICD-10-CM | POA: Insufficient documentation

## 2013-12-20 DIAGNOSIS — R109 Unspecified abdominal pain: Secondary | ICD-10-CM

## 2013-12-20 DIAGNOSIS — F419 Anxiety disorder, unspecified: Secondary | ICD-10-CM

## 2013-12-20 DIAGNOSIS — R1115 Cyclical vomiting syndrome unrelated to migraine: Secondary | ICD-10-CM | POA: Insufficient documentation

## 2013-12-20 LAB — COMPREHENSIVE METABOLIC PANEL
ALT: 7 U/L (ref 0–53)
AST: 14 U/L (ref 0–37)
Albumin: 4.3 g/dL (ref 3.5–5.2)
Alkaline Phosphatase: 71 U/L (ref 39–117)
BUN: 17 mg/dL (ref 6–23)
CO2: 21 meq/L (ref 19–32)
Calcium: 9.7 mg/dL (ref 8.4–10.5)
Chloride: 101 mEq/L (ref 96–112)
Creatinine, Ser: 0.67 mg/dL (ref 0.50–1.35)
GFR calc Af Amer: >90 mL/min (ref >90)
GFR calc non Af Amer: >90 mL/min (ref >90)
Glucose, Bld: 88 mg/dL (ref 70–99)
POTASSIUM: 3.8 meq/L (ref 3.7–5.3)
Sodium: 139 mEq/L (ref 137–147)
TOTAL PROTEIN: 7.9 g/dL (ref 6.0–8.3)
Total Bilirubin: 0.4 mg/dL (ref 0.3–1.2)

## 2013-12-20 LAB — URINALYSIS, ROUTINE W REFLEX MICROSCOPIC
Bilirubin Urine: NEGATIVE
GLUCOSE, UA: NEGATIVE mg/dL
Hgb urine dipstick: NEGATIVE
Ketones, ur: 40 mg/dL — AB
LEUKOCYTES UA: NEGATIVE
Nitrite: NEGATIVE
PH: 6.5 (ref 5.0–8.0)
Protein, ur: NEGATIVE mg/dL
Specific Gravity, Urine: 1.014 (ref 1.005–1.030)
Urobilinogen, UA: 1 mg/dL (ref 0.0–1.0)

## 2013-12-20 LAB — CBC WITH DIFFERENTIAL/PLATELET
BASOS ABS: 0 10*3/uL (ref 0.0–0.1)
Basophils Relative: 0 % (ref 0–1)
EOS ABS: 0.1 10*3/uL (ref 0.0–0.7)
EOS PCT: 1 % (ref 0–5)
HCT: 36.9 % — ABNORMAL LOW (ref 39.0–52.0)
Hemoglobin: 12.2 g/dL — ABNORMAL LOW (ref 13.0–17.0)
LYMPHS PCT: 23 % (ref 12–46)
Lymphs Abs: 1.6 10*3/uL (ref 0.7–4.0)
MCH: 24.9 pg — ABNORMAL LOW (ref 26.0–34.0)
MCHC: 33.1 g/dL (ref 30.0–36.0)
MCV: 75.5 fL — AB (ref 78.0–100.0)
Monocytes Absolute: 0.5 10*3/uL (ref 0.1–1.0)
Monocytes Relative: 7 % (ref 3–12)
Neutro Abs: 4.8 10*3/uL (ref 1.7–7.7)
Neutrophils Relative %: 69 % (ref 43–77)
PLATELETS: 286 10*3/uL (ref 150–400)
RBC: 4.89 MIL/uL (ref 4.22–5.81)
RDW: 16.3 % — AB (ref 11.5–15.5)
WBC: 7 10*3/uL (ref 4.0–10.5)

## 2013-12-20 LAB — RAPID URINE DRUG SCREEN, HOSP PERFORMED
AMPHETAMINES: NOT DETECTED
BENZODIAZEPINES: NOT DETECTED
Barbiturates: NOT DETECTED
COCAINE: NOT DETECTED
OPIATES: NOT DETECTED
Tetrahydrocannabinol: POSITIVE — AB

## 2013-12-20 LAB — LIPASE, BLOOD: Lipase: 17 U/L (ref 11–59)

## 2013-12-20 LAB — TROPONIN I
Troponin I: <0.3 ng/mL (ref <0.30)
Troponin I: <0.3 ng/mL (ref <0.30)

## 2013-12-20 MED ORDER — SODIUM CHLORIDE 0.9 % IV BOLUS (SEPSIS)
1000.0000 mL | Freq: Once | INTRAVENOUS | Status: AC
  Administered 2013-12-20: 1000 mL via INTRAVENOUS

## 2013-12-20 MED ORDER — ONDANSETRON HCL 4 MG/2ML IJ SOLN
4.0000 mg | Freq: Once | INTRAMUSCULAR | Status: AC
  Administered 2013-12-20: 4 mg via INTRAVENOUS
  Filled 2013-12-20: qty 2

## 2013-12-20 MED ORDER — ONDANSETRON HCL 4 MG PO TABS: 4.0000 mg | ORAL_TABLET | Freq: Four times a day (QID) | ORAL | Status: DC

## 2013-12-20 MED ORDER — HYDROMORPHONE HCL PF 1 MG/ML IJ SOLN
0.5000 mg | Freq: Once | INTRAMUSCULAR | Status: AC
  Administered 2013-12-20: 0.5 mg via INTRAVENOUS
  Filled 2013-12-20: qty 1

## 2013-12-20 MED ORDER — IOHEXOL 300 MG/ML  SOLN
100.0000 mL | Freq: Once | INTRAMUSCULAR | Status: AC | PRN
  Administered 2013-12-20: 100 mL via INTRAVENOUS

## 2013-12-20 MED ORDER — IOHEXOL 300 MG/ML  SOLN
25.0000 mL | Freq: Once | INTRAMUSCULAR | Status: AC | PRN
  Administered 2013-12-20: 25 mL via ORAL

## 2013-12-20 NOTE — ED Notes (Signed)
Spoke CSW about providing resources to pt. CSW will speak with pt
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
## 2013-12-20 NOTE — Discharge Instructions (Signed)
Please call and set up an appointment with hand specialist regarding injury-will most likely need surgery Please call and set up an appointment with gastroenterology regarding ongoing abdominal pain Please call and set up an appointment with surgery regarding lesion on the liver Please call and set up an appointment with dentist regarding ongoing dental pain and cavities Please take Zofran as needed for nausea control Please rest and stay hydrated Please avoid intake of fat or greasy foods Please continue to monitor symptoms closely and if symptoms worsen or change (fever greater than 101, chills, chest pain, shortness of breath, difficulty breathing, numbness, tingling, blood in the stools, black tarry stools, blood in the urine, decreased urination and bowel movements, fall, injury to the hand, swelling and redness to the hand, loss of sensation, blurred vision, sudden loss of vision) please report back to the ED immediately   Finger Fracture Fractures of fingers are breaks in the bones of the fingers. There are many types of fractures. There are different ways of treating these fractures. Your health care provider will discuss the best way to treat your fracture. CAUSES Traumatic injury is the main cause of broken fingers. These include:  Injuries while playing sports.  Workplace injuries.  Falls. RISK FACTORS Activities that can increase your risk of finger fractures include:  Sports.  Workplace activities that involve machinery.  A condition called osteoporosis, which can make your bones less dense and cause them to fracture more easily. SIGNS AND SYMPTOMS The main symptoms of a broken finger are pain and swelling within 15 minutes after the injury. Other symptoms include:  Bruising of your finger.  Stiffness of your finger.  Numbness of your finger.  Exposed bones (compound fracture) if the fracture is severe. DIAGNOSIS  The best way to diagnose a broken bone is with X-ray  imaging. Additionally, your health care provider will use this X-ray image to evaluate the position of the broken finger bones.  TREATMENT  Finger fractures can be treated with:   Nonreduction This means the bones are in place. The finger is splinted without changing the positions of the bone pieces. The splint is usually left on for about a week to 10 days. This will depend on your fracture and what your health care provider thinks.  Closed reduction The bones are put back into position without using surgery. The finger is then splinted.  Open reduction and internal fixation The fracture site is opened. Then the bone pieces are fixed into place with pins or some type of hardware. This is seldom required. It depends on the severity of the fracture. HOME CARE INSTRUCTIONS   Follow your health care provider's instructions regarding activities, exercises, and physical therapy.  Only take over-the-counter or prescription medicines for pain, discomfort, or fever as directed by your health care provider. SEEK MEDICAL CARE IF: You have pain or swelling that limits the motion or use of your fingers. SEEK IMMEDIATE MEDICAL CARE IF:  Your finger becomes numb. MAKE SURE YOU:   Understand these instructions.  Will watch your condition.  Will get help right away if you are not doing well or get worse. Document Released: 10/05/2000 Document Revised: 04/13/2013 Document Reviewed: 02/02/2013 [facility] Patient Information 2014 [place], [place].   Abdominal Pain, Adult Many things can cause abdominal pain. Usually, abdominal pain is not caused by a disease and will improve without treatment. It can often be observed and treated at home. Your health care provider will do a physical exam and possibly order blood  tests and X-rays to help determine the seriousness of your pain. However, in many cases, more time must pass before a clear cause of the pain can be found. Before that point, your health care  provider may not know if you need more testing or further treatment. HOME CARE INSTRUCTIONS  Monitor your abdominal pain for any changes. The following actions may help to alleviate any discomfort you are experiencing:  Only take over-the-counter or prescription medicines as directed by your health care provider.  Do not take laxatives unless directed to do so by your health care provider.  Try a clear liquid diet (broth, tea, or water) as directed by your health care provider. Slowly move to a bland diet as tolerated. SEEK MEDICAL CARE IF:  You have unexplained abdominal pain.  You have abdominal pain associated with nausea or diarrhea.  You have pain when you urinate or have a bowel movement.  You experience abdominal pain that wakes you in the night.  You have abdominal pain that is worsened or improved by eating food.  You have abdominal pain that is worsened with eating fatty foods. SEEK IMMEDIATE MEDICAL CARE IF:   Your pain does not go away within 2 hours.  You have a fever.  You keep throwing up (vomiting).  Your pain is felt only in portions of the abdomen, such as the right side or the left lower portion of the abdomen.  You pass bloody or black tarry stools. MAKE SURE YOU:  Understand these instructions.   Will watch your condition.   Will get help right away if you are not doing well or get worse.  Document Released: 04/02/2005 Document Revised: 04/13/2013 Document Reviewed: 03/02/2013 [facility] Patient Information 2014 [place], [place]. Dental Pain A tooth ache may be caused by cavities (tooth decay). Cavities expose the nerve of the tooth to air and hot or cold temperatures. It may come from an infection or abscess (also called a boil or furuncle) around your tooth. It is also often caused by dental caries (tooth decay). This causes the pain you are having. DIAGNOSIS  Your caregiver can diagnose this problem by exam. TREATMENT   If caused by an  infection, it may be treated with medications which kill germs (antibiotics) and pain medications as prescribed by your caregiver. Take medications as directed.  Only take over-the-counter or prescription medicines for pain, discomfort, or fever as directed by your caregiver.  Whether the tooth ache today is caused by infection or dental disease, you should see your dentist as soon as possible for further care. SEEK MEDICAL CARE IF: The exam and treatment you received today has been provided on an emergency basis only. This is not a substitute for complete medical or dental care. If your problem worsens or new problems (symptoms) appear, and you are unable to meet with your dentist, call or return to this location. SEEK IMMEDIATE MEDICAL CARE IF:   You have a fever.  You develop redness and swelling of your face, jaw, or neck.  You are unable to open your mouth.  You have severe pain uncontrolled by pain medicine. MAKE SURE YOU:   Understand these instructions.  Will watch your condition.  Will get help right away if you are not doing well or get worse. Document Released: 06/23/2005 Document Revised: 09/15/2011 Document Reviewed: 02/09/2008 [facility] Patient Information 2014 [place], [place].  Generalized Anxiety Disorder Generalized anxiety disorder (GAD) is a mental disorder. It interferes with life functions, including relationships, work, and school. GAD  is different from normal anxiety, which everyone experiences at some point in their lives in response to specific life events and activities. Normal anxiety actually helps [place] prepare for and get through these life events and activities. Normal anxiety goes away after the event or activity is over.  GAD causes anxiety that is not necessarily related to specific events or activities. It also causes excess anxiety in proportion to specific events or activities. The anxiety associated with GAD is also difficult to control. GAD can vary  from mild to severe. People with severe GAD can have intense waves of anxiety with physical symptoms (panic attacks).  SYMPTOMS The anxiety and worry associated with GAD are difficult to control. This anxiety and worry are related to many life events and activities and also occur more days than not for 6 months or longer. People with GAD also have three or more of the following symptoms (one or more in children):  Restlessness.   Fatigue.  Difficulty concentrating.   Irritability.  Muscle tension.  Difficulty sleeping or unsatisfying sleep. DIAGNOSIS GAD is diagnosed through an assessment by your caregiver. Your caregiver will ask you questions aboutyour mood,physical symptoms, and events in your life. Your caregiver may ask you about your medical history and use of alcohol or drugs, including prescription medications. Your caregiver may also do a physical exam and blood tests. Certain medical conditions and the use of certain substances can cause symptoms similar to those associated with GAD. Your caregiver may refer you to a mental health specialist for further evaluation. TREATMENT The following therapies are usually used to treat GAD:   Medication Antidepressant medication usually is prescribed for long-term daily control. Antianxiety medications may be added in severe cases, especially when panic attacks occur.   Talk therapy (psychotherapy) Certain types of talk therapy can be helpful in treating GAD by providing support, education, and guidance. A form of talk therapy called cognitive behavioral therapy can teach you healthy ways to think about and react to daily life events and activities.  Stress managementtechniques These include yoga, meditation, and exercise and can be very helpful when they are practiced regularly. A mental health specialist can help determine which treatment is best for you. Some people see improvement with one therapy. However, other people require a  combination of therapies. Document Released: 10/18/2012 Document Reviewed: 10/18/2012 [facility] Patient Information 2014 [place], [place].   Emergency Department Resource Guide 1) Find a Doctor and Pay Out of Pocket Although you won't have to find out who is covered by your insurance plan, it is a good idea to ask around and get recommendations. You will then need to call the office and see if the doctor you have chosen will accept you as a new patient and what types of options they offer for patients who are self-pay. Some doctors offer discounts or will set up payment plans for their patients who do not have insurance, but you will need to ask so you aren't surprised when you get to your appointment.  2) Contact Your Local Health Department Not all health departments have doctors that can see patients for sick visits, but many do, so it is worth a call to see if yours does. If you don't know where your local health department is, you can check in your phone book. The CDC also has a tool to help you locate your state's health department, and many state websites also have listings of all of their local health departments.  3) Find a Walk-in  Clinic If your illness is not likely to be very severe or complicated, you may want to try a walk in clinic. These are popping up all over the country in pharmacies, drugstores, and shopping centers. They're usually staffed by nurse practitioners or physician assistants that have been trained to treat common illnesses and complaints. They're usually fairly quick and inexpensive. However, if you have serious medical issues or chronic medical problems, these are probably not your best option.  No Primary Care Doctor: - Call Health Connect at  [phone] - they can help you locate a primary care doctor that  accepts your insurance, provides certain services, etc. - Physician Referral Service- [phone]  Chronic Pain Problems: Organization          Address  Phone   Notes  [facility]  [phone] Patients need to be referred by their primary care doctor.   Medication Assistance: Organization         Address  Phone   Notes  [facility] [address] [place] [place], [place] [address] [phone] --Must be a resident of [facility] -- Must have NO insurance coverage whatsoever (no Medicaid/ Medicare, etc.) -- The pt. MUST have a primary care doctor that directs their care regularly and follows them in the community   [facility]  [phone]   [employer]  [phone]    Agencies that provide inexpensive medical care: Organization         Address  Phone   Notes  [facility]  [phone]   [facility]    [phone]   [facility] [address] [place], [place] [address] [phone]   [facility] [address] [place], [place] [phone]   Planned Parenthood    [phone]   [facility]    [phone]   [facility]  [address], [place] Phone:  [phone], Fax:  [phone] Hours of Operation:  9 am - 6 pm, M-F.  Also accepts Medicaid/Medicare and self-pay.  [facility]  [address], [place] Phone: [phone], Fax: [phone]. Hours of Operation:  8:30 am - 5:30 pm, M-F.  Also accepts Medicaid and self-pay.  [facility] [place] [address] [place] Phone: [phone]   [facility] [address] Natasha Bence [place], [place] [phone], Ext. [phone] Mondays & Thursdays: 7-9 AM.  First 15 patients are seen on a first come, first serve basis.    Medicaid-accepting [facility] Providers:  Organization         Address  Phone   Notes  [facility] [address],  [phone] Also accepts self-pay patients.  [facility] [address] Laurell Josephs [place], [place]  [phone]   [facility] [address], [place] [phone]   [facility] [address], [place] [phone]   Renaye Rakers [address], [place]   [phone] Only accepts [place] patients after they have their name applied to their card.   Self-Pay (no insurance) in [facility]:  Organization         Address  Phone   Notes  Sickle Cell Patients, Engineer, technical sales Internal Medicine [address] [place], [place] (  [phone]   [facility] [address] [place], [place] [phone]   [facility] [place]  [address], [place] [phone]   [facility]/Dr. Osei-Bonsu  [address], [place] or [address], [place] [phone] Phone number for both [place] and [place] locations is the same.  [facility] [address] [place] [phone]   [facility] [address] [place] or [address] [phone]   [facility] [address] [place] [phone], phone; [phone], fax Sees patients 1st and 3rd Saturday of every month.  Must not qualify for public or private insurance (i.e. Medicaid, Medicare, [place] Health Choice, Veterans' Benefits)  Household income should be no more than 200% of the poverty level The clinic cannot treat you if you are pregnant or think you are pregnant  Sexually transmitted diseases are not treated at the clinic.    Dental Care: Organization         Address  Phone  Notes  [facility] [address] [place], [place] [phone] Accepts children up to age 41 who are enrolled in [place] or [place] Health Choice; pregnant women with a Medicaid card; and  children who have applied for Medicaid or [place] Health Choice, but were declined, whose parents can pay a reduced fee at time of service.  [facility] Department of [facility]  [address], [place] [phone] Accepts children up to age 30 who are enrolled in [place] or [place] Health Choice; pregnant women with a Medicaid card; and children who have applied for Medicaid or  Health Choice, but were declined, whose parents can pay a reduced fee at time of service.  [facility]  [address] [place], [place] [phone] Patients are seen by appointment only. Walk-ins are not accepted. [facility] will see patients 33 years of age and older. Monday - Tuesday (8am-5pm) Most Wednesdays (8:30-5pm) $30 per visit, cash only  [facility] Adult [facility] Access PRO[facility]  [address], [facility] [phone] Patients are seen by appointment only. Walk-ins are not accepted. [facility] will see patients 71 years of age and older. One Wednesday Evening (Monthly: Volunteer Based).  $30 per visit, cash only  [employer]  [phone] for adults; Children under age 14, call Graduate Pediatric Dentistry at [phone]. Children aged 21-14, please call [phone] to request a pediatric application.  Dental services are provided in all areas of dental care including fillings, crowns and bridges, complete and partial dentures, implants, gum treatment, root canals, and extractions. Preventive care is also provided. Treatment is provided to both adults and children. Patients are selected via a lottery and there is often a waiting list.   [facility] [address], [place]  [phone] [email or web address]   [facility] [address] [place], [place] [phone], Ext. [phone] Second and Fourth Thursday of each month, opens at 6:30 AM; Clinic ends at 9 AM.  Patients are seen on a first-come first-served  basis, and a limited number are seen during each clinic.   [facility]  [address] Ether Griffins [place], [place] [phone]   Eligibility Requirements You must have lived in [place], [place], or [place] counties  for at least the last three months.   You cannot be eligible for state or federal sponsored [place]healthcare insurance, including CIG[employer]Veterans Administration, [place]icaid, or [employer]icare.   You generally cannot be eligible for healthcare insurance through your employer.    How to apply: Eligibility screenings are held every Tuesday and Wednesday afternoon from 1:00 pm until 4:00 pm. You do not need an appointment for the interview!  [facility] [address], [place], [place] [phone]   [facility]  [phone]   [facility]  [phone]   [facility]  [phone]    Behavioral Health Resources in the Community: Intensive Outpatient Programs Organization         Address  Phone  Notes  [facility] [address], [place], [place] [phone]   [facility] [address], [place], [place] [phone]   [facility] [address], [place], [place]  [phone]   [facility] [address],  [place], [place] [phone] or [phone]   Substance Abuse Resources Organization         Address  Phone  Notes  [facility]  [phone]   [facility]  [phone]   [facility]  [phone]   [facility]  [phone]   Residential & Outpatient Substance Abuse Program  [phone]   Psychological Services Organization         Address  Phone  Notes  [facility]  [phone]- [phone]   [facility]  [phone]   [facility] [address], [place] [phone] or [phone]    Mobile Crisis Teams Organization          Address  Phone  Notes  [facility], Mobile Crisis Care Unit  [phone]   [facility]  [address] [place], [place] [phone]   Doristine LocksSharon DeEsch [address] [place] [phone]    Self-Help/Support Groups Organization         Address  Phone             Notes  [facility] [place] - variety of support groups  [phone] Call for more information  Narcotics Anonymous (NA), [facility] [address], [place]  2 meetings at this location   Statisticianesidential Treatment Programs Organization         Address  Phone  Notes  [facility] [address],    [place]  [phone]   [facility]  [address], [place] [phone], [place], [place] [phone]   [facility] [address], [place] [phone] Admissions: 8am-3pm M-F  [facility] [address]    [place], [place] [phone]   [facility] [address], [place], [place] [phone]   [facility] [address]  [place], [place] [phone]   [facility] - Intensive Outpatient [address], [place], [place] [phone]   [facility] ([facility] [address]  [place], [place] [phone] or [phone]   [facility] ([facility]) [address] [place], [place] [phone] Accepts Medicaid  [facility] [address]  [place] [phone] Substance Abuse/Addiction Treatment   [facility] Resources Organization         Address  Phone  Notes  [facility]  [phone]  [phone]   Angie FavaJulie Brannon, PhD [address], [address] [place], [place]   [phone] or [phone]   [facility]   [address] [place], [place] [phone]   [facility] [address], [place], [place] [phone] Insurance/Medicaid/sponsorship  through [facility]  Faith and Families [address]                                    [place], [place] [phone] Therapy/tele-psych/case  [facility] [address]Laurel [place]   [place], [place] [phone]    Dr. Lolly MustacheArfeen  [phone]   [facility]  [employer] [facility] 1) [address], [place] 2) [address], [place] 3)  [address], [place] [phone]  [phone]) [phone]   [facility] Child Abuse Hotline [phone]) [phone] or [phone]) [phone] (After Hours)

## 2013-12-20 NOTE — ED Notes (Signed)
Paged ortho tech to apply new splint

## 2013-12-20 NOTE — ED Notes (Signed)
Pt presents to department for evaluation of R hand pain. States fracture to R ring finger several weeks ago. Now states increased pain and discomfort. Pt states he can't afford to see orthopedic specialist, [facility] 10/10 pain upon arrival. Pt is alert and oriented x4.

## 2013-12-20 NOTE — ED Notes (Signed)
Pa notified that pt complaining of abd pain. PA will come speak with pt. Will update pt

## 2013-12-20 NOTE — ED Notes (Signed)
Notified CT that pt has finished his contrast.   

## 2013-12-20 NOTE — ED Notes (Signed)
Ortho tech at bedside to apply splint.  

## 2013-12-20 NOTE — ED Notes (Signed)
Assisted [place] with taking off pt's splint to access his finger; girlfriend at bedside

## 2013-12-20 NOTE — ED Notes (Signed)
Pt tolerated fluid challenge well. No N/V

## 2013-12-20 NOTE — ED Notes (Signed)
Gave pt water for fluid challenge. Will follow up to see how pt tolerates.

## 2013-12-20 NOTE — ED Notes (Signed)
Pt has returned from being out of the department; pt placed back on monitor, continuous pulse oximetry and blood pressure cuff; girlfriend at bedside

## 2013-12-20 NOTE — ED Notes (Signed)
Pt states he has been throwing up for years. He presents today with these symptoms and abd pain/burning. He states he cannot afford to get pain medication for his hand and has been taking ibuprofen. He is worried this is hurting his stomach. Pt also states he just wants someone to look at his hand again since he is unable to see a specialist. He feels his cast needs to be redone- the plaster is no longer hard.

## 2013-12-20 NOTE — ED Notes (Signed)
Courtney, NT given report 
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
## 2013-12-20 NOTE — ED Notes (Addendum)
Rounding on patient and reports numb lips for 30 minutes now after returning from CT scan.  Pt is complaining of stomach, chest and head pain.  Pt states numbness to right lower lip only

## 2013-12-22 NOTE — ED Provider Notes (Signed)
  This was a shared visit with a mid-level provided (NP or PA).  Throughout the patient's course I was available for consultation/collaboration.  I saw the ECG (if appropriate), relevant labs and studies - I agree with the interpretation.  On my exam the patient was in no distress.  He has multiple medical problems, multiple complaints, but is neurologically intact and hemodynamically stable, with no decompensation throughout an emergency department monitoring course. Patient's and requires a specialist here, which the patient has previously not completed. There is no evidence of new neurologic compromise of the hand. Patient was discharged in stable condition after again being provided additional resources, assistance with followup care.       Gerhard Munchobert Lockwood, MD 12/22/13 1400

## 2014-01-04 ENCOUNTER — Encounter (HOSPITAL_COMMUNITY): Payer: Self-pay | Admitting: Emergency Medicine

## 2014-01-04 ENCOUNTER — Emergency Department (HOSPITAL_COMMUNITY): Payer: Self-pay

## 2014-01-04 ENCOUNTER — Emergency Department (HOSPITAL_COMMUNITY)
Admission: EM | Admit: 2014-01-04 | Discharge: 2014-01-05 | Disposition: A | Discharge status: Home / Self Care | Payer: Self-pay | Attending: Emergency Medicine | Admitting: Emergency Medicine

## 2014-01-04 DIAGNOSIS — Z792 Long term (current) use of antibiotics: Secondary | ICD-10-CM | POA: Insufficient documentation

## 2014-01-04 DIAGNOSIS — S62609P Fracture of unspecified phalanx of unspecified finger, subsequent encounter for fracture with malunion: Secondary | ICD-10-CM

## 2014-01-04 DIAGNOSIS — R29898 Other symptoms and signs involving the musculoskeletal system: Secondary | ICD-10-CM

## 2014-01-04 DIAGNOSIS — M24541 Contracture, right hand: Secondary | ICD-10-CM

## 2014-01-04 DIAGNOSIS — M24549 Contracture, unspecified hand: Secondary | ICD-10-CM | POA: Insufficient documentation

## 2014-01-04 DIAGNOSIS — F411 Generalized anxiety disorder: Secondary | ICD-10-CM | POA: Insufficient documentation

## 2014-01-04 DIAGNOSIS — Z8709 Personal history of other diseases of the respiratory system: Secondary | ICD-10-CM | POA: Insufficient documentation

## 2014-01-04 DIAGNOSIS — B352 Tinea manuum: Secondary | ICD-10-CM | POA: Insufficient documentation

## 2014-01-04 DIAGNOSIS — IMO0002 Reserved for concepts with insufficient information to code with codable children: Secondary | ICD-10-CM | POA: Insufficient documentation

## 2014-01-04 DIAGNOSIS — Z8659 Personal history of other mental and behavioral disorders: Secondary | ICD-10-CM | POA: Insufficient documentation

## 2014-01-04 DIAGNOSIS — Z4789 Encounter for other orthopedic aftercare: Secondary | ICD-10-CM | POA: Insufficient documentation

## 2014-01-04 DIAGNOSIS — M259 Joint disorder, unspecified: Secondary | ICD-10-CM | POA: Insufficient documentation

## 2014-01-04 MED ORDER — OXYCODONE-ACETAMINOPHEN 5-325 MG PO TABS
2.0000 | ORAL_TABLET | Freq: Once | ORAL | Status: AC
  Administered 2014-01-04: 2 via ORAL
  Filled 2014-01-04: qty 2

## 2014-01-04 NOTE — ED Provider Notes (Signed)
CSN: [id number]     Arrival date & time 01/04/14  2037 History   First MD Initiated Contact with Patient 01/04/14 2213     Chief Complaint  Patient presents with  . Hand Injury     (Consider location/radiation/quality/duration/timing/severity/associated sxs/prior Treatment) HPI Levi Palmer is a(n) 28 y.o. male who presents to the ed with CC finger injury. The patient has a pmh of anxiety, major depression,panic attack, CNVS and admission for pneumomediastinum in January of 2015 secondary to esophageal microperforation along with run of V-tach suspected to be secondary to his new Zoloft rx . On 11/24/2013 the patient initially broke the R 4th proximal phalanx (see the note by Dr. Linwood DibblesJon Knapp). He has been seen multiple times in the ED for Hand complaint, Psych complaints, and CNVS. The patient states that he has had problems with his splint which was redressed on 6/16. He states that it has been giving him pain in his wrist and has gotten progressively looser. Today the patient had an anxiety attack. He states that he fell on the stairs and his splint was caught on the rail. As he fell the splint pulled off of the hand. The patient complains of pain in the same finger. He also c/o Right rib pain form fall. He denies numbness or tingling in the finger. He denies difficulty breathing, hemoptysis.   Past Medical History  Diagnosis Date  . Medical history non-contributory   . Anxiety   . Panic attack as reaction to stress    Past Surgical History  Procedure Laterality Date  . Cholecystectomy    . Tonsillectomy    . Incise and drain abcess     History reviewed. No pertinent family history. History  Substance Use Topics  . Smoking status: Never Smoker   . Smokeless tobacco: Never Used  . Alcohol Use: No    Review of Systems  Ten systems reviewed and are negative for acute change, except as noted in the HPI.    Allergies  Tramadol and Zoloft  Home Medications   Prior to  Admission medications   Medication Sig Start Date End Date Taking? Authorizing Provider  ibuprofen (ADVIL,MOTRIN) 200 MG tablet Take 200-800 mg by mouth every 6 (six) hours as needed for moderate pain.    Yes Historical Provider, MD  meloxicam (MOBIC) 7.5 MG tablet Take 2 tablets (15 mg total) by mouth daily. 12/18/13  Yes Junius FinnerErin O'Malley, PA-C  ondansetron (ZOFRAN) 4 MG tablet Take 1 tablet (4 mg total) by mouth every 6 (six) hours. 12/20/13  Yes Marissa Sciacca, PA-C  promethazine (PHENERGAN) 25 MG tablet Take 1 tablet (25 mg total) by mouth every 6 (six) hours as needed for nausea or vomiting. 12/18/13  Yes Junius FinnerErin O'Malley, PA-C  traMADol (ULTRAM) 50 MG tablet Take 1 tablet (50 mg total) by mouth every 6 (six) hours as needed. 12/18/13  Yes Junius FinnerErin O'Malley, PA-C   BP 123/84  Pulse 103  Temp(Src) 97.9 F (36.6 C) (Oral)  Resp 18  SpO2 99% Physical Exam  Nursing note and vitals reviewed. Constitutional: He is oriented to person, place, and time. He appears well-developed and well-nourished. No distress.  HENT:  Head: Normocephalic and atraumatic.  Eyes: Conjunctivae are normal. No scleral icterus.  Neck: Normal range of motion. Neck supple.  Cardiovascular: Normal rate, regular rhythm and normal heart sounds.   Pulmonary/Chest: Effort normal and breath sounds normal. No respiratory distress. He exhibits tenderness.  No bruising or deformity of the chest wall. Mild ttp  Abdominal: Soft. Bowel sounds are normal. There is no tenderness.  Musculoskeletal: He exhibits tenderness.  Right Proximal phalanx with obvious deformity. Base with volar displacement. There appears to be contracture of the flexor tendons. Cap refill <3sec.  Neurological: He is alert and oriented to person, place, and time.  Skin: Skin is warm and dry. He is not diaphoretic.  Scaling, and peeling of the medial side of the hand in the distribution of the ulnar gutter splint consistent with tinea Manu.  Psychiatric: His mood  appears anxious. His affect is angry and labile. He is agitated. He exhibits a depressed mood. He expresses no homicidal and no suicidal ideation.    ED Course  ORTHOPEDIC INJURY TREATMENT Date/Time: 01/05/2014 10:32 AM Performed by: Levi CaptainHARRIS, Larenzo Caples Authorized by: Levi CaptainHARRIS, [name] Consent: Verbal consent obtained. Risks and benefits: risks, benefits and alternatives were discussed Consent given by: patient Patient identity confirmed: verbally with patient Time out: Immediately prior to procedure a "time out" was called to verify the correct patient, procedure, equipment, support staff and site/side marked as required. Injury location: finger Location details: right ring finger Injury type: fracture Fracture type: distal phalanx MCP joint involved: no Pre-procedure neurovascular assessment: neurovascularly intact Pre-procedure distal perfusion: normal Pre-procedure neurological function: normal Pre-procedure range of motion: reduced Local anesthesia used: yes Anesthesia: digital block Local anesthetic: lidocaine 2% without epinephrine Anesthetic total: 6 ml Patient sedated: no Manipulation performed: yes Skeletal traction used: yes Reduction successful: no X-ray confirmed reduction: Unable to reduce fracture -see repeat xray. Immobilization: splint Splint type: static finger and volar short arm Supplies used: aluminum splint Post-procedure neurovascular assessment: post-procedure neurovascularly intact Post-procedure distal perfusion: normal Post-procedure neurological function: normal Post-procedure range of motion: unchanged Patient tolerance: Patient tolerated the procedure well with no immediate complications. Comments: Patient Finger with obvious deformity and contracture.  after digital block full exam was performed alongside Dr. Blinda LeatherwoodPollina. His digit remain is flexion contracture . Unable to reduce the fracture as the feels to be firm callous formation. Patient is unable  extend the digit, unable to make a fist and functional use of the digit is minimal. Patient finger place in rigid extension with hoper for some reduction of the contracture.    (including critical care time) Labs Review Labs Reviewed - No data to display  Imaging Review Dg Ribs Unilateral W/chest Right  01/04/2014   CLINICAL DATA:  Fall with right anterior axillary pain.  EXAM: RIGHT RIBS AND CHEST - 3+ VIEW  COMPARISON:  12/20/2013  FINDINGS: No fracture or other bone lesions are seen involving the ribs. There is no evidence of pneumothorax or pleural effusion. Both lungs are clear. Heart size and mediastinal contours are within normal limits.  IMPRESSION: Negative.   Electronically Signed   By: Tiburcio PeaJonathan  Watts M.D.   On: 01/04/2014 22:13   Dg Hand Complete Right  01/04/2014   CLINICAL DATA:  Fall.  EXAM: RIGHT HAND - COMPLETE 3+ VIEW  COMPARISON:  12/20/2013  FINDINGS: Angulation of the previous base of fourth proximal phalanx fracture is identified. The distal fracture fragments are medially and dorsally angulated which is new from the close reduction films from 12/20/2013. No additional fractures or subluxations identified.  IMPRESSION: 1. Interval posterior and medial angulation of subacute fracture involving the base of the fourth proximal phalanx.   Electronically Signed   By: Signa Kellaylor  Stroud M.D.   On: 01/04/2014 22:12     EKG Interpretation None      MDM   Final diagnoses:  Fracture, finger,  with malunion, subsequent encounter  Contracture of joint of finger, right  Finger dysfunction  Tinea manus    On initial patient visit the patient is angry and labile. He seems very frustrated. He states that he has all these medical problems and because he is uninsured he can;t get any help. The patient states that he did try to follow with Dr. Knute Neu. He states that he went to his office, however front desk staff told him he needed $250.00 up front. Patient left without being  seen. Patient came back to be re-splinted on 6/16 and was referred to Dr. Amanda Pea who appropriately redirected the patient to Dr. Izora Ribas. At this point patient has malunion with poorly healing fracture. Contracture of thr flexor tendons. Unable to make a fist or extend the finger. It has severely limited the use of his dominant hand. I did attempt orthopedic alingment without success as the fracture appears to be in a more angulated position from previous xrays. My suspicion is that the fracture bacame more displaced at some earlier point and he has callous formation preventing relaignment. At this point a surgical repair seems Likely. I spoke with Dr. Melvyn Novas on the phone about appropriate OP referral and he again requests a redirect to Dr. Izora Ribas as he was the on call physician the night of the original fracture on 11/24/2013. I also gave the patient the name of some of the hand surgeons at [facility]. He is currently awaiting follow up and [facility] card [facility]  I have discussed return precautions, Social work and case management not available at this time, however he has had multiple visits with these services.    Levi Captain, PA-C 01/05/14 1729

## 2014-01-04 NOTE — ED Notes (Signed)
Pt in stating he fell today down some stairs, states he had a splint for a right ring finger but the splint fell off when he fell today, states he fell due to a panic attack, c/o right rib pain after fall also

## 2014-01-05 ENCOUNTER — Emergency Department (HOSPITAL_COMMUNITY): Payer: No Typology Code available for payment source

## 2014-01-05 MED ORDER — OXYCODONE-ACETAMINOPHEN 5-325 MG PO TABS
2.0000 | ORAL_TABLET | Freq: Once | ORAL | Status: AC
  Administered 2014-01-05: 2 via ORAL
  Filled 2014-01-05: qty 2

## 2014-01-05 MED ORDER — TERBINAFINE HCL 1 % EX CREA: 1.0000 "application " | TOPICAL_CREAM | Freq: Two times a day (BID) | CUTANEOUS | Status: DC

## 2014-01-05 MED ORDER — HYDROCODONE-ACETAMINOPHEN 5-325 MG PO TABS: 1.0000 | ORAL_TABLET | Freq: Four times a day (QID) | ORAL | Status: DC | PRN

## 2014-01-05 MED ORDER — NAPROXEN 500 MG PO TABS: 500.0000 mg | ORAL_TABLET | Freq: Two times a day (BID) | ORAL | Status: DC

## 2014-01-05 NOTE — Discharge Instructions (Signed)
This finger may have been refractured prior to todays event. The tendons appear to have become contracted in there current states and you will likely need surgical intervention to regain function of your finger.  Please keep the finger in extension and immobilized for the next 6 weeks. Please contact Dr. Izora Ribasoley first, explain your situation again to the front desk. I will also contact Dr. Izora Ribasoley as well. [facility]. Athlete's Foot (in this case it's your hand). Athlete's foot (tinea pedis) is a fungal infection of the skin on the feet. It often occurs on the skin between the toes or underneath the toes. It can also occur on the soles of the feet. Athlete's foot is more likely to occur in hot, humid weather. Not washing your feet or changing your socks often enough can contribute to athlete's foot. The infection can spread from person to person (contagious). CAUSES Athlete's foot is caused by a fungus. This fungus thrives in warm, moist places. Most people get athlete's foot by sharing shower stalls, towels, and wet floors with an infected person. People with weakened immune systems, including those with diabetes, may be more likely to get athlete's foot. SYMPTOMS   Itchy areas between the toes or on the soles of the feet.  White, flaky, or scaly areas between the toes or on the soles of the feet.  Tiny, intensely itchy blisters between the toes or on the soles of the feet.  Tiny cuts on the skin. These cuts can develop a bacterial infection.  Thick or discolored toenails. DIAGNOSIS  Your caregiver can usually tell what the problem is by doing a physical exam. Your caregiver may also take a skin sample from the rash area. The skin sample may be examined under a microscope, or it may be tested to see if fungus will grow in the sample. A sample may also be taken from your toenail for testing. TREATMENT  Over-the-counter and prescription medicines can be used  to kill the fungus. These medicines are available as powders or creams. Your caregiver can suggest medicines for you. Fungal infections respond slowly to treatment. You may need to continue using your medicine for several weeks. PREVENTION   Do not share towels.  Wear sandals in wet areas, such as shared locker rooms and shared showers.  Keep your feet dry. Wear shoes that allow air to circulate. Wear cotton or wool socks. HOME CARE INSTRUCTIONS   Take medicines as directed by your caregiver. Do not use steroid creams on athlete's foot.  Keep your feet clean and cool. Wash your feet daily and dry them thoroughly, especially between your toes.  Change your socks every day. Wear cotton or wool socks. In hot climates, you may need to change your socks 2 to 3 times per day.  Wear sandals or canvas tennis shoes with good air circulation.  If you have blisters, soak your feet in Burow's solution or Epsom salts for 20 to 30 minutes, 2 times a day to dry out the blisters. Make sure you dry your feet thoroughly afterward. SEEK MEDICAL CARE IF:   You have a fever.  You have swelling, soreness, warmth, or redness in your foot.  You are not getting better after 7 days of treatment.  You are not completely cured after 30 days.  You have any problems caused by your medicines. MAKE SURE YOU:   Understand these instructions.  Will watch your condition.  Will get help right away  if you are not doing well or get worse. Document Released: 06/20/2000 Document Revised: 09/15/2011 Document Reviewed: 04/11/2011 [facility] Patient Information 2015 [place], [place]. This information is not intended to replace advice given to you by your health care provider. Make sure you discuss any questions you have with your health care provider.  Hand Fracture Your caregiver has diagnosed you with a fractured (broken) bone in your hand. If the bones are in good position and the hand is properly immobilized and  rested, these injuries will usually heal in 3 to 6 weeks. A cast, splint, or bulky bandage is usually applied to keep the fracture site from moving. Do not remove the splint or cast until your caregiver approves. If the fracture is unstable or the bones are not aligned properly, surgery may be needed. Keep your hand raised (elevated) above the level of your heart as much as possible for the next 2 to 3 days until the swelling and pain are better. Apply ice packs for 15-20 minutes every 3 to 4 hours to help control the pain and swelling. See your caregiver or an orthopedic specialist as directed for follow-up care to make sure the fracture is beginning to heal properly. SEEK IMMEDIATE MEDICAL CARE IF:   You notice your fingers are cold, numb, crooked, or the pain of your injury is severe.  You are not improving or seem to be getting worse.  You have questions or concerns. Document Released: 07/31/2004 Document Revised: 09/15/2011 Document Reviewed: 12/19/2008 [facility]Care Patient Information 2015 [place], [place]. This information is not intended to replace advice given to you by your health care provider. Make sure you discuss any questions you have with your health care provider.

## 2014-01-05 NOTE — ED Notes (Signed)
Pt to radiology for xray

## 2014-01-05 NOTE — ED Notes (Signed)
Returned from radiology. 

## 2014-01-05 NOTE — ED Notes (Signed)
Patient left with all personal belongings. 

## 2014-01-05 NOTE — Progress Notes (Signed)
Orthopedic Tech Progress Note Patient Details:  Levi GunningMichael N Palmer [birth year] [id number]  Ortho Devices Type of Ortho Device: Velcro wrist splint;Finger splint   Haskell Flirtewsome, Dhanvi Boesen M 01/05/2014, 12:51 AM

## 2014-01-05 NOTE — Progress Notes (Signed)
ED CM received call from patient yelling and swearing about not being able to afford pain medications.  Attempted to investigate further how I could possibly assist patient, He hurled several more profane comment and the call was then disconnected.

## 2014-01-07 NOTE — ED Provider Notes (Signed)
Medical screening examination/treatment/procedure(s) were performed by non-physician practitioner and as supervising physician I was immediately available for consultation/collaboration.   EKG Interpretation None        Gilda Creasehristopher J. Leamon Palau, MD 01/07/14 [phone]

## 2014-01-10 ENCOUNTER — Ambulatory Visit: Payer: Self-pay | Attending: Internal Medicine

## 2014-01-13 ENCOUNTER — Emergency Department (HOSPITAL_COMMUNITY)
Admission: EM | Admit: 2014-01-13 | Discharge: 2014-01-13 | Disposition: A | Discharge status: Home / Self Care | Payer: No Typology Code available for payment source | Attending: Emergency Medicine | Admitting: Emergency Medicine

## 2014-01-13 ENCOUNTER — Encounter (HOSPITAL_COMMUNITY): Payer: Self-pay | Admitting: Emergency Medicine

## 2014-01-13 DIAGNOSIS — Z8659 Personal history of other mental and behavioral disorders: Secondary | ICD-10-CM | POA: Insufficient documentation

## 2014-01-13 DIAGNOSIS — M2450 Contracture, unspecified joint: Secondary | ICD-10-CM

## 2014-01-13 DIAGNOSIS — M24541 Contracture, right hand: Secondary | ICD-10-CM

## 2014-01-13 DIAGNOSIS — Z9089 Acquired absence of other organs: Secondary | ICD-10-CM | POA: Insufficient documentation

## 2014-01-13 DIAGNOSIS — M2459 Contracture, other specified joint: Secondary | ICD-10-CM | POA: Insufficient documentation

## 2014-01-13 DIAGNOSIS — R109 Unspecified abdominal pain: Secondary | ICD-10-CM

## 2014-01-13 DIAGNOSIS — R112 Nausea with vomiting, unspecified: Secondary | ICD-10-CM | POA: Insufficient documentation

## 2014-01-13 DIAGNOSIS — Z79899 Other long term (current) drug therapy: Secondary | ICD-10-CM | POA: Insufficient documentation

## 2014-01-13 DIAGNOSIS — R197 Diarrhea, unspecified: Secondary | ICD-10-CM

## 2014-01-13 DIAGNOSIS — R630 Anorexia: Secondary | ICD-10-CM | POA: Insufficient documentation

## 2014-01-13 DIAGNOSIS — R1013 Epigastric pain: Secondary | ICD-10-CM | POA: Insufficient documentation

## 2014-01-13 DIAGNOSIS — R1033 Periumbilical pain: Secondary | ICD-10-CM | POA: Insufficient documentation

## 2014-01-13 LAB — CBC WITH DIFFERENTIAL/PLATELET
Basophils Absolute: 0 10*3/uL (ref 0.0–0.1)
Basophils Relative: 0 % (ref 0–1)
Eosinophils Absolute: 0.2 10*3/uL (ref 0.0–0.7)
Eosinophils Relative: 2 % (ref 0–5)
HCT: 37 % — ABNORMAL LOW (ref 39.0–52.0)
Hemoglobin: 12.2 g/dL — ABNORMAL LOW (ref 13.0–17.0)
LYMPHS ABS: 2.2 10*3/uL (ref 0.7–4.0)
LYMPHS PCT: 23 % (ref 12–46)
MCH: 24.8 pg — ABNORMAL LOW (ref 26.0–34.0)
MCHC: 33 g/dL (ref 30.0–36.0)
MCV: 75.2 fL — AB (ref 78.0–100.0)
Monocytes Absolute: 0.7 10*3/uL (ref 0.1–1.0)
Monocytes Relative: 7 % (ref 3–12)
NEUTROS ABS: 6.7 10*3/uL (ref 1.7–7.7)
Neutrophils Relative %: 68 % (ref 43–77)
Platelets: 313 10*3/uL (ref 150–400)
RBC: 4.92 MIL/uL (ref 4.22–5.81)
RDW: 16.4 % — ABNORMAL HIGH (ref 11.5–15.5)
WBC: 9.8 10*3/uL (ref 4.0–10.5)

## 2014-01-13 LAB — LIPASE, BLOOD: Lipase: 22 U/L (ref 11–59)

## 2014-01-13 LAB — COMPREHENSIVE METABOLIC PANEL
ALK PHOS: 85 U/L (ref 39–117)
ALT: 11 U/L (ref 0–53)
AST: 14 U/L (ref 0–37)
Albumin: 4.4 g/dL (ref 3.5–5.2)
Anion gap: 18 — ABNORMAL HIGH (ref 5–15)
BUN: 19 mg/dL (ref 6–23)
CHLORIDE: 99 meq/L (ref 96–112)
CO2: 21 mEq/L (ref 19–32)
Calcium: 9.7 mg/dL (ref 8.4–10.5)
Creatinine, Ser: 0.72 mg/dL (ref 0.50–1.35)
GLUCOSE: 100 mg/dL — AB (ref 70–99)
POTASSIUM: 4 meq/L (ref 3.7–5.3)
Sodium: 138 mEq/L (ref 137–147)
Total Bilirubin: 0.4 mg/dL (ref 0.3–1.2)
Total Protein: 8.4 g/dL — ABNORMAL HIGH (ref 6.0–8.3)

## 2014-01-13 LAB — URINALYSIS, ROUTINE W REFLEX MICROSCOPIC
Glucose, UA: NEGATIVE mg/dL
Hgb urine dipstick: NEGATIVE
Ketones, ur: 15 mg/dL — AB
Leukocytes, UA: NEGATIVE
NITRITE: NEGATIVE
Protein, ur: NEGATIVE mg/dL
SPECIFIC GRAVITY, URINE: 1.028 (ref 1.005–1.030)
Urobilinogen, UA: 1 mg/dL (ref 0.0–1.0)
pH: 6 (ref 5.0–8.0)

## 2014-01-13 LAB — I-STAT TROPONIN, ED: Troponin i, poc: 0 ng/mL (ref 0.00–0.08)

## 2014-01-13 LAB — I-STAT CG4 LACTIC ACID, ED: LACTIC ACID, VENOUS: 0.55 mmol/L (ref 0.5–2.2)

## 2014-01-13 MED ORDER — ONDANSETRON 4 MG PO TBDP
4.0000 mg | ORAL_TABLET | Freq: Once | ORAL | Status: AC
  Administered 2014-01-13: 4 mg via ORAL
  Filled 2014-01-13: qty 1

## 2014-01-13 MED ORDER — ONDANSETRON HCL 8 MG PO TABS: 8.0000 mg | ORAL_TABLET | Freq: Three times a day (TID) | ORAL | Status: DC | PRN

## 2014-01-13 MED ORDER — ONDANSETRON HCL 4 MG PO TABS
4.0000 mg | ORAL_TABLET | Freq: Once | ORAL | Status: AC
  Administered 2014-01-13: 4 mg via ORAL
  Filled 2014-01-13: qty 1

## 2014-01-13 MED ORDER — MORPHINE SULFATE 4 MG/ML IJ SOLN
4.0000 mg | Freq: Once | INTRAMUSCULAR | Status: AC
Start: 2014-01-13 — End: 2014-01-13
  Administered 2014-01-13: 4 mg via INTRAVENOUS
  Filled 2014-01-13: qty 1

## 2014-01-13 MED ORDER — OXYCODONE-ACETAMINOPHEN 5-325 MG PO TABS
1.0000 | ORAL_TABLET | Freq: Once | ORAL | Status: AC
Start: 2014-01-13 — End: 2014-01-13
  Administered 2014-01-13: 1 via ORAL
  Filled 2014-01-13: qty 1

## 2014-01-13 MED ORDER — SODIUM CHLORIDE 0.9 % IV BOLUS (SEPSIS)
1000.0000 mL | Freq: Once | INTRAVENOUS | Status: AC
  Administered 2014-01-13: 1000 mL via INTRAVENOUS

## 2014-01-13 MED ORDER — OXYCODONE-ACETAMINOPHEN 5-325 MG PO TABS: 1.0000 | ORAL_TABLET | Freq: Four times a day (QID) | ORAL | Status: DC | PRN

## 2014-01-13 NOTE — ED Notes (Signed)
Pt tolerated gingerale and crackers well. No N/V

## 2014-01-13 NOTE — ED Notes (Signed)
Per EMS: Pt reports abdominal pain, reports he has been having pain since Monday. Reports associated N/V. Only able to keep down small amounts of liquids at a time. Pt also reports pain in his 4th digit of his R hand. Digits appears contracted, painful to move. Ax4, NAD at this time.

## 2014-01-13 NOTE — ED Notes (Signed)
Gave pt gingerale and crackers for PO challenge 

## 2014-01-13 NOTE — ED Provider Notes (Signed)
CSN: [id number]     Arrival date & time 01/13/14  0245 History   First MD Initiated Contact with Patient 01/13/14 [phone]     Chief Complaint  Patient presents with  . Abdominal Pain  . Hand Pain     (Consider location/radiation/quality/duration/timing/severity/associated sxs/prior Treatment) HPI Comments: Levi Palmer is a 28 y.o. Male with a PMHx of anxiety and panic attacks, presenting with 4 days of midline, achy and occasionally sharp, 10/10 , nonradiating abd pain associated with N/V/D. Emesis and stool are both non-bilious, non-bloody. Stool is watery. States he feels like having a BM, but doesn't produce much stool, and then the abd pain worsens after trying to have a BM. States he has been diagnosed before with cyclic vomiting syndrome. States these cycles come at random times and with no known trigger. Reports that he's been unable to tolerate most food and fluids that he's attempted, but was able to keep chicken broth down last night. Denies fevers/chills, HA, blurry vision, CP, SOB, dysuria, hematuria, myalgias or arthralgias. Denies sick contacts, recent travel, or suspicious food intake.  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 None of his pain or symptoms have worsened today.  Patient is a 28 y.o. male presenting with abdominal pain and hand pain. The history is provided by the patient. No language interpreter was used.  Abdominal Pain Pain location:  Epigastric and periumbilical Pain quality: aching and sharp   Pain radiates to:  Does not radiate Pain severity:  Moderate Onset quality:  Gradual Duration:  4 days Timing:  Constant Progression:  Unchanged Chronicity:  Recurrent Context: not diet changes, not eating, not recent travel, not sick contacts and not suspicious food intake   Context comment:  Pt reports ?cyclic vomiting syndrome diagnosis Relieved by:  Nothing Worsened  by:  Bowel movements Ineffective treatments:  Liquids and position changes Associated symptoms: anorexia, belching, diarrhea, nausea and vomiting   Associated symptoms: no chest pain, no chills, no constipation, no cough, no dysuria, no fever, no flatus, no hematemesis, no hematochezia, no hematuria, no melena, no shortness of breath and no sore throat   Hand Pain Associated symptoms include abdominal pain, anorexia, arthralgias (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 Pertinent negatives include no chest pain, chills, coughing, fever, headaches, myalgias, rash, sore throat or weakness.    Past Medical History  Diagnosis Date  . Medical history non-contributory   . Anxiety   . Panic attack as reaction to stress    Past Surgical History  Procedure Laterality Date  . Cholecystectomy    . Tonsillectomy    . Incise and drain abcess     No family history on file. History  Substance Use Topics  . Smoking status: Never Smoker   . Smokeless tobacco: Never Used  . Alcohol Use: No    Review of Systems  Constitutional: Negative for fever and chills.  HENT: Negative for sore throat.   Respiratory: Negative for cough and shortness of breath.   Cardiovascular: Negative for chest pain.  Gastrointestinal: Positive for nausea, vomiting, abdominal pain, diarrhea and anorexia. Negative for constipation, blood in stool, melena, hematochezia, abdominal distention, anal bleeding, rectal pain, flatus and hematemesis.  Endocrine: Negative for polydipsia, polyphagia and polyuria.  Genitourinary: Negative for dysuria, urgency, frequency, hematuria, flank pain, decreased urine volume, discharge, penile swelling, scrotal swelling and testicular pain.  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 Negative for back pain and myalgias.  Skin: Negative for color change and rash.  Neurological: Negative for dizziness, syncope, weakness,  light-headedness and headaches.  10 Systems reviewed and are negative for acute change except as noted in the HPI.     Allergies  Tramadol and Zoloft  Home Medications   Prior to Admission medications   Medication Sig Start Date End Date Taking? Authorizing Provider  ibuprofen (ADVIL,MOTRIN) 200 MG tablet Take 200-800 mg by mouth every 6 (six) hours as needed for moderate pain.    Yes Historical Provider, MD  ondansetron (ZOFRAN) 4 MG tablet Take 1 tablet (4 mg total) by mouth every 6 (six) hours. 12/20/13   Marissa Sciacca, PA-C  ondansetron (ZOFRAN) 8 MG tablet Take 1 tablet (8 mg total) by mouth every 8 (eight) hours as needed for nausea or vomiting. 01/13/14   Donnita FallsMercedes Strupp Camprubi-Soms, PA-C  oxyCODONE-acetaminophen (PERCOCET) 5-325 MG per tablet Take 1-2 tablets by mouth every 6 (six) hours as needed for severe pain. 01/13/14   Elysha Daw Strupp Camprubi-Soms, PA-C   BP 129/78  Pulse 64  Temp(Src) 98 F (36.7 C) (Oral)  Resp 16  Ht 5\' 7"  (1.702 m)  SpO2 100% Physical Exam  Nursing note and vitals reviewed. Constitutional: He is oriented to person, place, and time. Vital signs are normal. He appears well-developed and well-nourished. No distress.  Afebrile, non-toxic  HENT:  Head: Normocephalic and atraumatic.  Nose: Nose normal.  Mouth/Throat: Mucous membranes are dry.  Mucous membranes slightly dry  Eyes: Conjunctivae and EOM are normal. Pupils are equal, round, and reactive to light. No scleral icterus.  Neck: Normal range of motion. Neck supple.  Cardiovascular: Normal rate, regular rhythm, normal heart sounds and intact distal pulses.   No murmur heard. Pulmonary/Chest: Effort normal and breath sounds normal. No respiratory distress. He has no wheezes. He has no rales.  Abdominal: Soft. Normal appearance and bowel sounds are normal. He exhibits no distension. There is generalized tenderness. There is no rigidity, no rebound, no guarding, no CVA tenderness, no tenderness  at McBurney's point and negative Murphy's sign.  Soft, non-distended. TTP throughout, most notable in epigastrum and periumbilical regions but pt winces at palpation anywhere. No murphy's, no Mcburney's, neg psoas sign, neg foot tap pain. Neg CVA ttp  Musculoskeletal: Normal range of motion.  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 This is a chronic issue per pt.  Neurological: He is alert and oriented to person, place, and time.  Skin: Skin is warm, dry and intact. No rash noted. No erythema.  Psychiatric: He has a normal mood and affect.    ED Course  Procedures (including critical care time) Labs Review Labs Reviewed  CBC WITH DIFFERENTIAL - Abnormal; Notable for the following:    Hemoglobin 12.2 (*)    HCT 37.0 (*)    MCV 75.2 (*)    MCH 24.8 (*)    RDW 16.4 (*)    All other components within normal limits  COMPREHENSIVE METABOLIC PANEL - Abnormal; Notable for the following:    Glucose, Bld 100 (*)    Total Protein 8.4 (*)    Anion gap 18 (*)    All other components within normal limits  URINALYSIS, ROUTINE W REFLEX MICROSCOPIC - Abnormal; Notable for the following:    Color, Urine AMBER (*)    Bilirubin Urine SMALL (*)    Ketones, ur  15 (*)    All other components within normal limits  LIPASE, BLOOD  I-STAT TROPOININ, ED  I-STAT CG4 LACTIC ACID, ED    Imaging Review No results found.   EKG Interpretation None      MDM   Final diagnoses:  Non-intractable vomiting with nausea, vomiting of unspecified type  Diarrhea  Abdominal pain, acute  Contracture of finger joint, right    Levi Palmer is a 28 y.o. male with a PMHx of anxiety and ?cyclic vomiting syndrome, presenting today with N/V/D and abd pain x 4 days. Patient is afebrile and nontoxic-appearing. Nonacute abdomen, although complains of pain with any palpation. Labs showing chronic, stable anemia. No WBC, lytes WNL,  AG 18 but lactic acid WNL. Lipase WNL. Will give Morphine, fluids, and consider reglan if zofran does not help now, then PO challenge. Doubt appendicitis, diverticulitis, or ischemic bowel, do not feel need to CT pt at this time.  9:15 AM Pt feeling much better after morphine, 4/10 pain now, nausea continues to be resolved with zofran, will po challenge pt and prepare for d/c. This is likely viral gastroenteritis, given labs are unremarkable for acute changes, and pt doing better now.   9:30 AM Patient tolerated PO challenge in ER without any vomiting throughout ER stay. Symptoms consistent with gastroenteritis or possibly consistent with his ?dx of cyclic vomiting syndrome. Patient has no comorbidities and is appropriate for outpatient treatment gastroenteritis. Spoke at length with patient about  signs or symptoms that should prompt return to emergency Department. Pt voices understanding and is agreeable plan. Also discussed need for ortho f/up for digit contracture as this is a chronic issue and will likely need surgery, which can not be managed through the ED. Pt understands and will work on this. Rx for zofran 8mg  tabs and percocet and f/up with PCP found on resource guide. Stable for d/c.  BP 129/78  Pulse 64  Temp(Src) 98 F (36.7 C) (Oral)  Resp 16  Ht 5\' 7"  (1.702 m)  SpO2 100%    [employer], PA-C 01/13/14 1829

## 2014-01-13 NOTE — Discharge Instructions (Signed)
Use zofran as prescribed, as needed for nausea. Stay well hydrated with small sips of fluids throughout the day. Follow a BRAT (banana-rice-applesauce-toast) diet as described below for the next 24-48 hours. The 'BRAT' diet is suggested, then progress to diet as tolerated as symptoms abate. Call if bloody stools, persistent diarrhea, vomiting, fever or abdominal pain. Return to ER for changing or worsening of symptoms.  Food Choices to Help Relieve Diarrhea When you have diarrhea, the foods you eat and your eating habits are very important. Choosing the right foods and drinks can help relieve diarrhea. Also, because diarrhea can last up to 7 days, you need to replace lost fluids and electrolytes (such as sodium, potassium, and chloride) in order to help prevent dehydration.  WHAT GENERAL GUIDELINES DO I NEED TO FOLLOW?  Slowly drink 1 cup (8 oz) of fluid for each episode of diarrhea. If you are getting enough fluid, your urine will be clear or pale yellow.  Eat starchy foods. Some good choices include white rice, white toast, pasta, low-fiber cereal, baked potatoes (without the skin), saltine crackers, and bagels.  Avoid large servings of any cooked vegetables.  Limit fruit to two servings per day. A serving is  cup or 1 small piece.  Choose foods with less than 2 g of fiber per serving.  Limit fats to less than 8 tsp (38 g) per day.  Avoid fried foods.  Eat foods that have probiotics in them. Probiotics can be found in certain dairy products.  Avoid foods and beverages that may increase the speed at which food moves through the stomach and intestines (gastrointestinal tract). Things to avoid include:  High-fiber foods, such as dried fruit, raw fruits and vegetables, nuts, seeds, and whole grain foods.  Spicy foods and high-fat foods.  Foods and beverages sweetened with high-fructose corn syrup, honey, or sugar alcohols such as xylitol, sorbitol, and mannitol. WHAT FOODS ARE  RECOMMENDED? Grains White rice. White, [place], or pita breads (fresh or toasted), including plain rolls, buns, or bagels. White pasta. Saltine, soda, or graham crackers. Pretzels. Low-fiber cereal. Cooked cereals made with water (such as cornmeal, farina, or cream cereals). Plain muffins. Matzo. Melba toast. Zwieback.  Vegetables Potatoes (without the skin). Strained tomato and vegetable juices. Most well-cooked and canned vegetables without seeds. Tender lettuce. Fruits Cooked or canned applesauce, apricots, cherries, fruit cocktail, grapefruit, peaches, pears, or plums. Fresh bananas, apples without skin, cherries, grapes, cantaloupe, grapefruit, peaches, oranges, or plums.  Meat and Other Protein Products Baked or boiled chicken. Eggs. Tofu. Fish. Seafood. Smooth peanut butter. Ground or well-cooked tender beef, ham, veal, lamb, pork, or poultry.  Dairy Plain yogurt, kefir, and unsweetened liquid yogurt. Lactose-free milk, buttermilk, or soy milk. Plain hard cheese. Beverages Sport drinks. Clear broths. Diluted fruit juices (except prune). Regular, caffeine-free sodas such as ginger ale. Water. Decaffeinated teas. Oral rehydration solutions. Sugar-free beverages not sweetened with sugar alcohols. Other Bouillon, broth, or soups made from recommended foods.  The items listed above may not be a complete list of recommended foods or beverages. Contact your dietitian for more options. WHAT FOODS ARE NOT RECOMMENDED? Grains Whole grain, whole wheat, bran, or rye breads, rolls, pastas, crackers, and cereals. Wild or [place] rice. Cereals that contain more than 2 g of fiber per serving. Corn tortillas or taco shells. Cooked or dry oatmeal. Granola. Popcorn. Vegetables Raw vegetables. Cabbage, broccoli, Brussels sprouts, artichokes, baked beans, beet greens, corn, kale, legumes, peas, sweet potatoes, and yams. Potato skins. Cooked spinach and cabbage. Fruits  Dried fruit, including raisins and dates.  Raw fruits. Stewed or dried prunes. Fresh apples with skin, apricots, mangoes, pears, raspberries, and strawberries.  Meat and Other Protein Products Chunky peanut butter. Nuts and seeds. Beans and lentils. Tomasa Blase.  Dairy High-fat cheeses. Milk, chocolate milk, and beverages made with milk, such as milk shakes. Cream. Ice cream. Sweets and Desserts Sweet rolls, doughnuts, and sweet breads. Pancakes and waffles. Fats and Oils Butter. Cream sauces. Margarine. Salad oils. Plain salad dressings. Olives. Avocados.  Beverages Caffeinated beverages (such as coffee, tea, soda, or energy drinks). Alcoholic beverages. Fruit juices with pulp. Prune juice. Soft drinks sweetened with high-fructose corn syrup or sugar alcohols. Other Coconut. Hot sauce. Chili powder. Mayonnaise. Gravy. Cream-based or milk-based soups.  The items listed above may not be a complete list of foods and beverages to avoid. Contact your dietitian for more information. WHAT SHOULD I DO IF I BECOME DEHYDRATED? Diarrhea can sometimes lead to dehydration. Signs of dehydration include dark urine and dry mouth and skin. If you think you are dehydrated, you should rehydrate with an oral rehydration solution. These solutions can be purchased at pharmacies, retail stores, or online.  Drink -1 cup (120-240 mL) of oral rehydration solution each time you have an episode of diarrhea. If drinking this amount makes your diarrhea worse, try drinking smaller amounts more often. For example, drink 1-3 tsp (5-15 mL) every 5-10 minutes.  A general rule for staying hydrated is to drink 1-2 L of fluid per day. Talk to your health care provider about the specific amount you should be drinking each day. Drink enough fluids to keep your urine clear or pale yellow. Document Released: 09/13/2003 Document Revised: 06/28/2013 Document Reviewed: 05/16/2013 [facility] Patient Information 2015 [place], [place]. This information is not intended to replace advice given  to you by your health care provider. Make sure you discuss any questions you have with your health care provider.  Abdominal (belly) pain can be caused by many things. Your caregiver performed an examination and possibly ordered blood/urine tests and imaging (CT scan, x-rays, ultrasound). Many cases can be observed and treated at home after initial evaluation in the emergency department. Even though you are being discharged home, abdominal pain can be unpredictable. Therefore, you need a repeated exam if your pain does not resolve, returns, or worsens. Most patients with abdominal pain don't have to be admitted to the hospital or have surgery, but serious problems like appendicitis and gallbladder attacks can start out as nonspecific pain. Many abdominal conditions cannot be diagnosed in one visit, so follow-up evaluations are very important. SEEK IMMEDIATE MEDICAL ATTENTION IF YOU DEVELOP ANY OF THE FOLLOWING SYMPTOMS:  The pain does not go away or becomes severe.   A temperature above 101 develops.   Repeated vomiting occurs (multiple episodes).   The pain becomes localized to portions of the abdomen. The right side could possibly be appendicitis. In an adult, the left lower portion of the abdomen could be colitis or diverticulitis.   Blood is being passed in stools or vomit (bright red or black tarry stools).   Return also if you develop chest pain, difficulty breathing, dizziness or fainting, or become confused, poorly responsive, or inconsolable (young children).  The constipation stays for more than 4 days.   There is belly (abdominal) or rectal pain.   You do not seem to be getting better.    Emergency Department Resource Guide 1) Find a Doctor and Pay Out of Pocket Although you won't have to  find out who is covered by your insurance plan, it is a good idea to ask around and get recommendations. You will then need to call the office and see if the doctor you have chosen will accept  you as a new patient and what types of options they offer for patients who are self-pay. Some doctors offer discounts or will set up payment plans for their patients who do not have insurance, but you will need to ask so you aren't surprised when you get to your appointment.  2) Contact Your Local Health Department Not all health departments have doctors that can see patients for sick visits, but many do, so it is worth a call to see if yours does. If you don't know where your local health department is, you can check in your phone book. The CDC also has a tool to help you locate your state's health department, and many state websites also have listings of all of their local health departments.  3) Find a Walk-in Clinic If your illness is not likely to be very severe or complicated, you may want to try a walk in clinic. These are popping up all over the country in pharmacies, drugstores, and shopping centers. They're usually staffed by nurse practitioners or physician assistants that have been trained to treat common illnesses and complaints. They're usually fairly quick and inexpensive. However, if you have serious medical issues or chronic medical problems, these are probably not your best option.  No Primary Care Doctor: - Call Health Connect at  [phone] - they can help you locate a primary care doctor that  accepts your insurance, provides certain services, etc. - Physician Referral Service- [phone]  Chronic Pain Problems: Organization         Address  Phone   Notes  [facility]  [phone] Patients need to be referred by their primary care doctor.   Medication Assistance: Organization         Address  Phone   Notes  [facility] Medication [facility] [address] [place], [place] [address] [phone] --Must be a resident of [facility] -- Must have NO insurance coverage whatsoever (no Medicaid/ Medicare, etc.) -- The pt. MUST  have a primary care doctor that directs their care regularly and follows them in the community   [facility]  [phone]   [employer]  [phone]    Agencies that provide inexpensive medical care: Organization         Address  Phone   Notes  [facility]  [phone]   [facility]    [phone]   [facility] [address] [place], [place] [address] [phone]   [facility] [address] , [place] [phone]   Planned Parenthood    [phone]   [facility]    [phone]   [facility]  [address], [place] Phone:  [phone], Fax:  [phone] Hours of Operation:  9 am - 6 pm, M-F.  Also accepts Medicaid/Medicare and self-pay.  [facility]  [address], [place] Phone: [phone], Fax: [phone]. Hours of Operation:  8:30 am - 5:30 pm, M-F.  Also accepts Medicaid and self-pay.  [facility] [place] [address] [employer] Phone: [phone]   [facility] [address], [place], [place] [phone], Ext. [phone] Mondays & Thursdays: 7-9 AM.  First 15 patients are seen on a first come, first serve basis.    Medicaid-accepting [facility] Providers:  Organization         Address  Phone   Notes  [facility] [address], [place] [phone] Also accepts self-pay patients.  [facility] [address]ure[address] Joseph[address] [place], [place]  [phone]   [facility] [address], [place] [phone]   [facility] [address] [place] [phone]   Renaye Rakers [address], [place]   [phone] Only accepts [place] Access [place] patients after they have their name applied to their card.   Self-Pay (no insurance) in [facility]:  Organization         Address  Phone   Notes  Sickle Cell Patients, [facility] [address] [place], [place] [phone]   [facility] [address] [place], [place] [phone]   [facility]  [address], [place] [phone]   [facility]/Dr. Osei-Bonsu  [address] , [place] or [address], [place] [phone] Phone number for both [place] and [place] locations is the same.  [facility]gent [facility] [address] [place] [phone]   [facility] [address] [place] or [address] [phone]   [facility] [address], [place] [phone], phone; [phone], fax Sees patients 1st and 3rd Saturday of every month.  Must not qualify for public or private insurance (i.e. Medicaid, Medicare, [place] Health Choice, Veterans' Benefits)  Household income should be no more than 200% of the poverty level The clinic cannot treat you if you are pregnant or think you are pregnant  Sexually transmitted diseases are not treated at the clinic.    Dental Care: Organization         Address  Phone  Notes  [facility] Department of [facility] [address] [place], [place] [phone] Accepts children up to age 53 who are enrolled in [place] or [place] Health Choice; pregnant women with a Medicaid card; and children who have applied for Medicaid or [place] Health Choice, but were declined, whose parents can pay a reduced fee at time of service.  [facility] Department of [facility]  [address], [place] [phone] Accepts children up to age 60 who are enrolled in [place] or [place] Health Choice; pregnant women with a Medicaid card; and children who have applied for Medicaid or [place] Health Choice, but were declined, whose parents can  pay a reduced fee at time of service.  [facility]  [address]  [place], [place] [phone] Patients are seen by appointment only. Walk-ins are not accepted. [facility] will see patients 58 years of age and older. Monday - Tuesday (8am-5pm) Most Wednesdays (8:30-5pm) $30 per visit, cash only  [facility]  [address], [facility] [phone] Patients are seen by appointment only. Walk-ins are not accepted. [facility] will see patients 87 years of age and older. One Wednesday Evening (Monthly: Volunteer Based).  $30 per visit, cash only  [employer]  (  [phone]) [phone] for adults; Children under age 334, call Graduate Pediatric Dentistry at [phone]. Children aged 534-14, please call [phone] to request a pediatric application.  Dental services are provided in all areas of dental care including fillings, crowns and bridges, complete and partial dentures, implants, gum treatment, root canals, and extractions. Preventive care is also provided. Treatment is provided to both adults and children. Patients are selected via a lottery and there is often a waiting list.   [facility] [address], [place]  [phone] [email or web address]   [facility] [address], [place], [place] [phone], Ext. [phone] Second and Fourth Thursday of each month, opens at 6:30 AM; Clinic ends at 9 AM.  Patients are seen on a first-come first-served basis, and a limited number are seen during each clinic.   [facility]  [address], [place], [place] [phone]   Eligibility Requirements You must have lived in [place], [place], or [place] counties for at least the last three months.   You cannot be eligible for state or federal sponsored [place]healthcare insurance, including CIGNAVeterans Administration, [place]icaid, or [employer]icare.   You generally cannot be eligible for healthcare  insurance through your employer.    How to apply: Eligibility screenings are held every Tuesday and Wednesday afternoon from 1:00 pm until 4:00 pm. You do not need an appointment for the interview!  [facility] [address], [place], [place] [phone]   [facility]  [phone]   [facility]  [phone]   [facility]  [phone]    Behavioral Health Resources in the Community: Intensive Outpatient Programs Organization         Address  Phone  Notes  [facility] [address], [place], [place] [phone]   [facility] [address], [place], [place] [phone]   [facility] [address], [place], [place]  [phone]   [facility] [address],  [place], [place] [phone] or [phone]   Substance Abuse Resources Organization         Address  Phone  Notes  [facility]  [phone]   [facility]  [phone]   [facility]  [phone]   [facility]  [phone]   Residential & Outpatient Substance Abuse Program  [phone]   Psychological Services Organization         Address  Phone  Notes  [facility]  [phone]- [phone]   [facility]  [phone]- [phone]   [facility] [address], [place] [phone] or [phone]    Mobile Crisis Teams Organization         Address  Phone  Notes  [facility], Mobile Crisis Care Unit  [phone]   [facility]  [address] [place], [place] [phone]   Doristine LocksSharon DeEsch [address] [place] [phone]    Self-Help/Support Groups Organization         Address  Phone             Notes  [facility] [place] - variety of support groups  336- [phone] Call for more information  Narcotics Anonymous (NA),  [facility] [address], [place]  2 meetings at this location   Chief Executive Officeresidential Treatment Programs Organization         Address  Phone  Notes  [facility] [address],    [place]  [phone]   [facility]  [address], [place] [address], [place], [place] [phone]   [facility] [address] [place], [place] [phone] Admissions: 8am-3pm M-F  [facility] [address]    [place], [place] [phone]   [facility] [address] [place], [place], [place] [phone]   [facility] [address]  [place], [place] [phone]   [facility] - Intensive Outpatient [address], [place], [place] [phone]   [facility] ([facility] [address]  [place], [place] [phone] or [phone]   [facility] ([facility]) [address] [place], [place] [phone] Accepts Medicaid  [facility] [address]  [place] [phone] Substance Abuse/Addiction Treatment   [facility] Organization         Address  Phone  Notes  [facility]  [phone]   Angie Fava, PhD [address] Kn[address] [place], [place]   [phone] or [phone]   [facility]   [address] [place], [place] [phone]   [facility] [address], [place], [place] [phone] Insurance/Medicaid/sponsorship through [facility] and Families [address]                                    [place], [place] [phone] Therapy/tele-psych/case  [facility] [address], [place] [phone]    Dr. Lolly Mustache  [phone]   [facility]  [employer] [facility] Dept. 1) [address], [place] 2) [address] [place] 3)  [address], [place] [phone]  [phone]     [facility] Child Abuse Hotline [phone] or [phone] (After Hours)

## 2014-01-16 NOTE — ED Provider Notes (Signed)
Medical screening examination/treatment/procedure(s) were performed by non-physician practitioner and as supervising physician I was immediately available for consultation/collaboration.   EKG Interpretation   Date/Time:  Friday January 13 2014 03:01:46 EDT Ventricular Rate:  108 PR Interval:  118 QRS Duration: 88 QT Interval:  310 QTC Calculation: 415 R Axis:   95 Text Interpretation:  Sinus tachycardia Rightward axis Borderline ECG ED  PHYSICIAN INTERPRETATION AVAILABLE IN [facility] Confirmed by TEST,  Record ([id number]) on 01/15/2014 1:01:04 PM        Gilda Creasehristopher J. Pollina, MD 01/16/14 [phone]

## 2014-02-09 ENCOUNTER — Encounter (HOSPITAL_COMMUNITY): Payer: Self-pay | Admitting: Emergency Medicine

## 2014-02-09 ENCOUNTER — Emergency Department (HOSPITAL_COMMUNITY)
Admission: EM | Admit: 2014-02-09 | Discharge: 2014-02-09 | Disposition: A | Discharge status: Home / Self Care | Payer: No Typology Code available for payment source | Attending: Emergency Medicine | Admitting: Emergency Medicine

## 2014-02-09 DIAGNOSIS — R079 Chest pain, unspecified: Secondary | ICD-10-CM | POA: Insufficient documentation

## 2014-02-09 DIAGNOSIS — R1115 Cyclical vomiting syndrome unrelated to migraine: Secondary | ICD-10-CM

## 2014-02-09 DIAGNOSIS — Z8659 Personal history of other mental and behavioral disorders: Secondary | ICD-10-CM | POA: Insufficient documentation

## 2014-02-09 DIAGNOSIS — R000 Tachycardia, unspecified: Secondary | ICD-10-CM | POA: Insufficient documentation

## 2014-02-09 DIAGNOSIS — R0602 Shortness of breath: Secondary | ICD-10-CM | POA: Insufficient documentation

## 2014-02-09 DIAGNOSIS — R61 Generalized hyperhidrosis: Secondary | ICD-10-CM | POA: Insufficient documentation

## 2014-02-09 DIAGNOSIS — R110 Nausea: Secondary | ICD-10-CM | POA: Insufficient documentation

## 2014-02-09 DIAGNOSIS — Z9089 Acquired absence of other organs: Secondary | ICD-10-CM | POA: Insufficient documentation

## 2014-02-09 DIAGNOSIS — R1013 Epigastric pain: Secondary | ICD-10-CM | POA: Insufficient documentation

## 2014-02-09 LAB — URINALYSIS, ROUTINE W REFLEX MICROSCOPIC
GLUCOSE, UA: NEGATIVE mg/dL
Leukocytes, UA: NEGATIVE
Nitrite: NEGATIVE
PROTEIN: 30 mg/dL — AB
Specific Gravity, Urine: 1.029 (ref 1.005–1.030)
UROBILINOGEN UA: 0.2 mg/dL (ref 0.0–1.0)
pH: 5.5 (ref 5.0–8.0)

## 2014-02-09 LAB — COMPREHENSIVE METABOLIC PANEL
ALBUMIN: 4.6 g/dL (ref 3.5–5.2)
ALK PHOS: 75 U/L (ref 39–117)
ALT: 6 U/L (ref 0–53)
ANION GAP: 18 — AB (ref 5–15)
AST: 13 U/L (ref 0–37)
BUN: 33 mg/dL — AB (ref 6–23)
CALCIUM: 9.8 mg/dL (ref 8.4–10.5)
CO2: 21 mEq/L (ref 19–32)
Chloride: 102 mEq/L (ref 96–112)
Creatinine, Ser: 0.79 mg/dL (ref 0.50–1.35)
GFR calc non Af Amer: >90 mL/min (ref >90)
Glucose, Bld: 93 mg/dL (ref 70–99)
Potassium: 3.8 mEq/L (ref 3.7–5.3)
SODIUM: 141 meq/L (ref 137–147)
TOTAL PROTEIN: 8.2 g/dL (ref 6.0–8.3)
Total Bilirubin: 0.4 mg/dL (ref 0.3–1.2)

## 2014-02-09 LAB — I-STAT CG4 LACTIC ACID, ED: Lactic Acid, Venous: 0.83 mmol/L (ref 0.5–2.2)

## 2014-02-09 LAB — I-STAT TROPONIN, ED: TROPONIN I, POC: 0 ng/mL (ref 0.00–0.08)

## 2014-02-09 LAB — CBC
HEMATOCRIT: 36.8 % — AB (ref 39.0–52.0)
HEMOGLOBIN: 12.1 g/dL — AB (ref 13.0–17.0)
MCH: 24.9 pg — ABNORMAL LOW (ref 26.0–34.0)
MCHC: 32.9 g/dL (ref 30.0–36.0)
MCV: 75.7 fL — ABNORMAL LOW (ref 78.0–100.0)
Platelets: 353 10*3/uL (ref 150–400)
RBC: 4.86 MIL/uL (ref 4.22–5.81)
RDW: 16.4 % — ABNORMAL HIGH (ref 11.5–15.5)
WBC: 9.8 10*3/uL (ref 4.0–10.5)

## 2014-02-09 LAB — LIPASE, BLOOD: LIPASE: 13 U/L (ref 11–59)

## 2014-02-09 LAB — URINE MICROSCOPIC-ADD ON

## 2014-02-09 MED ORDER — SODIUM CHLORIDE 0.9 % IV BOLUS (SEPSIS)
1000.0000 mL | Freq: Once | INTRAVENOUS | Status: AC
  Administered 2014-02-09: 1000 mL via INTRAVENOUS

## 2014-02-09 MED ORDER — ONDANSETRON 4 MG PO TBDP
8.0000 mg | ORAL_TABLET | Freq: Once | ORAL | Status: AC
  Administered 2014-02-09: 8 mg via ORAL
  Filled 2014-02-09: qty 2

## 2014-02-09 MED ORDER — MORPHINE SULFATE 4 MG/ML IJ SOLN
4.0000 mg | Freq: Once | INTRAMUSCULAR | Status: AC
  Administered 2014-02-09: 4 mg via INTRAVENOUS
  Filled 2014-02-09: qty 1

## 2014-02-09 MED ORDER — ONDANSETRON HCL 4 MG/2ML IJ SOLN
4.0000 mg | Freq: Once | INTRAMUSCULAR | Status: AC
  Administered 2014-02-09: 4 mg via INTRAVENOUS
  Filled 2014-02-09: qty 2

## 2014-02-09 MED ORDER — SODIUM CHLORIDE 0.9 % IV BOLUS (SEPSIS): 500.0000 mL | Freq: Once | INTRAVENOUS | Status: DC

## 2014-02-09 MED ORDER — PANTOPRAZOLE SODIUM 40 MG PO TBEC
40.0000 mg | DELAYED_RELEASE_TABLET | Freq: Once | ORAL | Status: AC
  Administered 2014-02-09: 40 mg via ORAL
  Filled 2014-02-09: qty 1

## 2014-02-09 MED ORDER — HYDROMORPHONE HCL PF 1 MG/ML IJ SOLN
1.0000 mg | Freq: Once | INTRAMUSCULAR | Status: AC
  Administered 2014-02-09: 1 mg via INTRAVENOUS
  Filled 2014-02-09: qty 1

## 2014-02-09 NOTE — ED Notes (Signed)
Pt c/o mid epi-gastric pain that radiates into lower mid abd and lower chest, along with n/v X 6 days, denies diarrhea. sts he tried pepto-bismal yesterday but didn't get much relief. sts he has been having this same problem for years and seen multiple times for the same. sts he is trying to get into the [place] [facility] but hasn't completed his paper work for them yet. Nad, skin warm and dry, resp e/u.

## 2014-02-09 NOTE — ED Provider Notes (Signed)
CSN: [id number]     Arrival date & time 02/09/14  0808 History   First MD Initiated Contact with Patient 02/09/14 [phone]     Chief Complaint  Patient presents with  . Emesis  . Abdominal Pain     (Consider location/radiation/quality/duration/timing/severity/associated sxs/prior Treatment) Patient is a 28 y.o. male presenting with vomiting and abdominal pain.  Emesis Associated symptoms: abdominal pain   Associated symptoms: no diarrhea   Abdominal Pain Associated symptoms: chest pain, nausea, shortness of breath and vomiting   Associated symptoms: no constipation, no diarrhea, no dysuria and no fever      Mr Wilemon is a 28 year old man with a history of cyclic vomiting syndrome who presents with ~4 days of nausea, emesis, and abdominal pain. He was in his usual state of health until about four days ago when he began having nausea and bilious but not bloody emesis. He also has a concurrent diffuse crampy and sharp abdominal pain that is worst in the epigastrium. He says has gotten worse over the past few days. Nothing makes it better or worse. He has had normal bowel movements. He says this episode is different than his usual cyclic vomiting syndrome episodes as usually it involves his chest more while now the pain is mainly epigastric with minimal chest pain.   Past Medical History  Diagnosis Date  . Medical history non-contributory   . Anxiety   . Panic attack as reaction to stress    Past Surgical History  Procedure Laterality Date  . Cholecystectomy    . Tonsillectomy    . Incise and drain abcess     History reviewed. No pertinent family history. History  Substance Use Topics  . Smoking status: Never Smoker   . Smokeless tobacco: Never Used  . Alcohol Use: No    Review of Systems  Constitutional: Positive for diaphoresis and appetite change. Negative for fever.  Respiratory: Positive for shortness of breath.   Cardiovascular: Positive for chest pain. Negative for  palpitations.  Gastrointestinal: Positive for nausea, vomiting and abdominal pain. Negative for diarrhea and constipation.  Genitourinary: Negative for dysuria and flank pain.      Allergies  Tramadol and Zoloft  Home Medications   Prior to Admission medications   Not on File   BP 123/89  Pulse 81  Temp(Src) 98.3 F (36.8 C) (Oral)  Resp 21  SpO2 98% Physical Exam  Constitutional: He is oriented to person, place, and time. He appears well-developed. He appears distressed.  thin  HENT:  Mouth/Throat: Oropharynx is clear and moist.  Eyes: EOM are normal. Pupils are equal, round, and reactive to light. No scleral icterus.  Cardiovascular: Regular rhythm, normal heart sounds and intact distal pulses.  Exam reveals no gallop and no friction rub.   No murmur heard. tachycardic  Pulmonary/Chest: Effort normal and breath sounds normal. No respiratory distress. He has no wheezes. He has no rales. He exhibits no tenderness.  Abdominal: Soft. Bowel sounds are normal. He exhibits no distension. There is tenderness. There is no rebound.  Voluntary guarding No CVA tenderness  Musculoskeletal: He exhibits no edema.  Neurological: He is alert and oriented to person, place, and time.  Skin: He is diaphoretic.    ED Course  Procedures (including critical care time) Labs Review Labs Reviewed  COMPREHENSIVE METABOLIC PANEL - Abnormal; Notable for the following:    BUN 33 (*)    Anion gap 18 (*)    All other components within normal limits  CBC - Abnormal; Notable for the following:    Hemoglobin 12.1 (*)    HCT 36.8 (*)    MCV 75.7 (*)    MCH 24.9 (*)    RDW 16.4 (*)    All other components within normal limits  URINE CULTURE  LIPASE, BLOOD  URINALYSIS, ROUTINE W REFLEX MICROSCOPIC  I-STAT TROPOININ, ED  I-STAT CG4 LACTIC ACID, ED    Imaging Review No results found.   EKG Interpretation   Date/Time:  Thursday February 09 2014 08:13:40 EDT Ventricular Rate:  106 PR  Interval:  116 QRS Duration: 80 QT Interval:  314 QTC Calculation: 417 R Axis:   94 Text Interpretation:  Sinus tachycardia Rightward axis RSR' or QR pattern  in V1 suggests right ventricular conduction delay Borderline ECG T waves  peaked today, new from prior Confirmed by [facility]  MD, BLAIR ([id number]) on  02/09/2014 8:24:50 AM      MDM   Final diagnoses:  Non-intractable cyclical vomiting with nausea    8:59AM: Patient presents with ~4 d nausea, bilious emesis and epigastric pain. This sounds like his usual episodes of cyclic vomiting syndrome except he says previous ones have more chest pain than he is experiencing now. Vitals notable for tachycardia in the 100s. Exam notable for diaphoresis, diffuse abdominal tenderness and voluntary guarding. Will obtain troponin, lactic acid, CMP, CBC, UA w UCx, and lipase. Ordered zofran odt 8 mg once, morphine 4 mg iv once, and 1 L NS.  10:14AM: Troponin negative, CBC unchanged from 3 weeks ago, lipase wnl, CMP wnl although anion gap of 18, lactic acid wnl. UA pending. Likely recurrent episode of cyclic vomiting syndrome that requires symptom management. Patient given dilaudid 1 mg iv, zofran 4 mg iv, and protonix 40 mg po.   10:51AM: Patient reports he feels much better after analgesics and fluid. He has not had any emesis since he presented. He feels comfortable going home.  Lorenda HatchetAdam L Shaquon Gropp, MD 02/09/14 [phone]

## 2014-02-09 NOTE — ED Notes (Signed)
Pt c/o generalized chest and abd pain with N/V x 3 days; pt sts hx of same in past

## 2014-02-09 NOTE — Discharge Instructions (Signed)
You were seen in the ED today for an episode of your cyclic vomiting syndrome. All of your labs were normal or unchanged from your baseline. Please seek medical attention or return to the ED if you have worsening or new pain or nausea and vomiting or any new worrisome medical conditions.  Cyclic Vomiting Syndrome Cyclic vomiting syndrome is a benign condition in which patients experience bouts or cycles of severe nausea and vomiting that last for hours or even days. The bouts of nausea and vomiting alternate with longer periods of no symptoms and generally good health. Cyclic vomiting syndrome occurs mostly in children, but can affect adults. CAUSES  [facility] has no known cause. Each episode is typically similar to the previous ones. The episodes tend to:   Start at about the same time of day.  Last the same length of time.  Present the same symptoms at the same level of intensity. Cyclic vomiting syndrome can begin at any age in children and adults. Cyclic vomiting syndrome usually starts between the ages of 3 and 7 years. In adults, episodes tend to occur less often than they do in children, but they last longer. Furthermore, the events or situations that trigger episodes in adults cannot always be pinpointed as easily as they can in children. There are 4 phases of cyclic vomiting syndrome: 1. Prodrome. The prodrome phase signals that an episode of nausea and vomiting is about to begin. This phase can last from just a few minutes to several hours. This phase is often marked by belly (abdominal) pain. Sometimes taking medicine early in the prodrome phase can stop an episode in progress. However, sometimes there is no warning. A person may simply wake up in the middle of the night or early morning and begin vomiting. 2. Episode. The episode phase consists of:  Severe vomiting.  Nausea.  Gagging (retching). 3. Recovery. The recovery phase begins when the nausea and vomiting stop. Healthy color,  appetite, and energy return. 4. Symptom-free interval. The symptom-free interval phase is the period between episodes when no symptoms are present. TRIGGERS Episodes can be triggered by an infection or event. Examples of triggers include:  Infections.  Colds, allergies, sinus problems, and the flu.  Eating certain foods such as chocolate or cheese.  Foods with monosodium glutamate (MSG) or preservatives.  Fast foods.  Pre-packaged foods.  Foods with low nutritional value (junk foods).  Overeating.  Eating just before going to bed.  Hot weather.  Dehydration.  Not enough sleep or poor sleep quality.  Physical exhaustion.  Menstruation.  Motion sickness.  Emotional stress (school or home difficulties).  Excitement or stress. SYMPTOMS  The main symptoms of cyclic vomiting syndrome are:  Severe vomiting.  Nausea.  Gagging (retching). Episodes usually begin at night or the first thing in the morning. Episodes may include vomiting or retching up to 5 or 6 times an hour during the worst of the episode. Episodes usually last anywhere from 1 to 4 days. Episodes can last for up to 10 days. Other symptoms include:  Paleness.  Exhaustion.  Listlessness.  Abdominal pain.  Loose stools or diarrhea. Sometimes the nausea and vomiting are so severe that a person appears to be almost unconscious. Sensitivity to light, headache, fever, dizziness, may also accompany an episode. In addition, the vomiting may cause drooling and excessive thirst. Drinking water usually leads to more vomiting, though the water can dilute the acid in the vomit, making the episode a little less painful. Continuous vomiting can  lead to dehydration, which means that the body has lost excessive water and salts. DIAGNOSIS  Cyclic vomiting syndrome is hard to diagnose because there are no clear tests to identify it. A caregiver must diagnose cyclic vomiting syndrome by looking at symptoms and medical  history. A caregiver must exclude more common diseases or disorders that can also cause nausea and vomiting. Also, diagnosis takes time because caregivers need to identify a pattern or cycle to the vomiting. TREATMENT  Cyclic vomiting syndrome cannot be cured. Treatment varies, but people with cyclic vomiting syndrome should get plenty of rest and sleep and take medications that prevent, stop, or lessen the vomiting episodes and other symptoms. People whose episodes are frequent and long-lasting may be treated during the symptom-free intervals in an effort to prevent or ease future episodes. The symptom-free phase is a good time to eliminate anything known to trigger an episode. For example, if episodes are brought on by stress or excitement, this period is the time to find ways to reduce stress and stay calm. If sinus problems or allergies cause episodes, those conditions should be treated. The triggers listed above should be avoided or prevented. Because of the similarities between migraine and cyclic vomiting syndrome, caregivers treat some people with severe cyclic vomiting syndrome with drugs that are also used for migraine headaches. The drugs are designed to:  Prevent episodes.  Reduce their frequency.  Lessen their severity. HOME CARE INSTRUCTIONS Once a vomiting episode begins, treatment is supportive. It helps to stay in bed and sleep in a dark, quiet room. Severe nausea and vomiting may require hospitalization and intravenous (IV) fluids to prevent dehydration. Relaxing medications (sedatives) may help if the nausea continues. Sometimes, during the prodrome phase, it is possible to stop an episode from happening altogether. Only take over-the-counter or prescription medicines for pain, discomfort or fever as directed by your caregiver. Do not give aspirin to children. During the recovery phase, drinking water and replacing lost electrolytes (salts in the blood) are very important.  Electrolytes are salts that the body needs to function well and stay healthy. Symptoms during the recovery phase can vary. Some people find that their appetites return to normal immediately, while others need to begin by drinking clear liquids and then move slowly to solid food. RELATED COMPLICATIONS The severe vomiting that defines cyclic vomiting syndrome is a risk factor for several complications:  Dehydration--Vomiting causes the body to lose water quickly.  Electrolyte imbalance--Vomiting also causes the body to lose the important salts it needs to keep working properly.  Peptic esophagitis--The tube that connects the mouth to the stomach (esophagus) becomes injured from the stomach acid that comes up with the vomit.  Hematemesis--The esophagus becomes irritated and bleeds, so blood mixes with the vomit.  Mallory-Weiss tear--The lower end of the esophagus may tear open or the stomach may bruise from vomiting or retching.  Tooth decay--The acid in the vomit can hurt the teeth by corroding the tooth enamel. SEEK MEDICAL CARE IF: You have questions or problems. Document Released: 09/01/2001 Document Revised: 09/15/2011 Document Reviewed: 09/30/2010 [facility] Patient Information 2015 [place], [place]. This information is not intended to replace advice given to you by your health care provider. Make sure you discuss any questions you have with your health care provider.

## 2014-02-09 NOTE — ED Provider Notes (Signed)
I saw and evaluated the patient, reviewed the resident's note and I agree with the findings and plan.   EKG Interpretation   Date/Time:  Thursday February 09 2014 08:13:40 EDT Ventricular Rate:  106 PR Interval:  116 QRS Duration: 80 QT Interval:  314 QTC Calculation: 417 R Axis:   94 Text Interpretation:  Sinus tachycardia Rightward axis RSR' or QR pattern  in V1 suggests right ventricular conduction delay Borderline ECG T waves  peaked today, new from prior Confirmed by [facility]  MD, Itzelle Gains ([id number]) on  02/09/2014 8:24:50 AM      Here with N/V - hx of similar with possible concern for cyclic vomiting syndrome. Epigastric pain on exam, voluntary guarding diffusely. Labs ok. Feeling better after fluids, pain meds. Stable for discharge.  Elwin MochaBlair Mischa Brittingham, MD 02/09/14 [phone]

## 2014-02-09 NOTE — ED Notes (Addendum)
Patient stated he's not able to void at this time cause he needs IV fluids, cause he hasn't been able to eat or drink in a few days and he's in pain, Nurse Dorene Grebeatalie is aware.

## 2014-02-10 LAB — URINE CULTURE
COLONY COUNT: NO GROWTH
CULTURE: NO GROWTH

## 2014-02-15 ENCOUNTER — Ambulatory Visit: Payer: Self-pay | Admitting: Internal Medicine

## 2014-03-02 ENCOUNTER — Encounter: Payer: Self-pay | Admitting: Internal Medicine

## 2014-03-02 ENCOUNTER — Ambulatory Visit: Payer: No Typology Code available for payment source | Attending: Internal Medicine | Admitting: Internal Medicine

## 2014-03-02 VITALS — BP 126/75 | HR 65 | Temp 98.2°F | Resp 18 | Ht 67.0 in | Wt 134.8 lb

## 2014-03-02 DIAGNOSIS — Z888 Allergy status to other drugs, medicaments and biological substances status: Secondary | ICD-10-CM | POA: Insufficient documentation

## 2014-03-02 DIAGNOSIS — IMO0001 Reserved for inherently not codable concepts without codable children: Secondary | ICD-10-CM

## 2014-03-02 DIAGNOSIS — H6123 Impacted cerumen, bilateral: Secondary | ICD-10-CM

## 2014-03-02 DIAGNOSIS — X58XXXA Exposure to other specified factors, initial encounter: Secondary | ICD-10-CM | POA: Insufficient documentation

## 2014-03-02 DIAGNOSIS — R4589 Other symptoms and signs involving emotional state: Secondary | ICD-10-CM | POA: Insufficient documentation

## 2014-03-02 DIAGNOSIS — R1013 Epigastric pain: Secondary | ICD-10-CM | POA: Insufficient documentation

## 2014-03-02 DIAGNOSIS — H6120 Impacted cerumen, unspecified ear: Secondary | ICD-10-CM | POA: Insufficient documentation

## 2014-03-02 DIAGNOSIS — S62609G Fracture of unspecified phalanx of unspecified finger, subsequent encounter for fracture with delayed healing: Secondary | ICD-10-CM

## 2014-03-02 DIAGNOSIS — S62609A Fracture of unspecified phalanx of unspecified finger, initial encounter for closed fracture: Secondary | ICD-10-CM | POA: Insufficient documentation

## 2014-03-02 DIAGNOSIS — Z886 Allergy status to analgesic agent status: Secondary | ICD-10-CM | POA: Insufficient documentation

## 2014-03-02 DIAGNOSIS — G8929 Other chronic pain: Secondary | ICD-10-CM

## 2014-03-02 NOTE — Progress Notes (Signed)
C/C Fx rt 2th finger on may 21,2015 Stated stomach ache, vomiting

## 2014-03-02 NOTE — Progress Notes (Signed)
Patient ID: Levi Palmer, male   DOB: [birth year], 28 y.o.   MRN: [id number]  CC:  Abdominal pain and hand pain   HPI:  Patient broke his right fourth finger back in may and reports that he wore a splint and then refractured his finger.  He states that he went back in July and was given a different splint and still feels that his finger is broken.  He reports that he has pain with movement and it is affecting the mobility of his fifth digit as well.   Stomach pain for past 5 years.  He reports nausea and gas.  He states that every morning after he has a BM he begins to have epigastric pain.  He had gallbladder removed in 2011 due to this same c/o but he continued to have pain and nausea.  He has had a negative endoscopy in [facility] after the gall bladder removal.  He has tried PPI's, gluten free diet, probiotics, and diet changes all with no relief.    Allergies  Allergen Reactions  . Tramadol Other (See Comments)    Jittery   . Zoloft [Sertraline Hcl] Palpitations   Past Medical History  Diagnosis Date  . Medical history non-contributory   . Anxiety   . Panic attack as reaction to stress    No current outpatient prescriptions on file prior to visit.   No current facility-administered medications on file prior to visit.   No family history on file. History   Social History  . Marital Status: Single    Spouse Name: N/A    Number of Children: N/A  . Years of Education: N/A   Occupational History  . Not on file.   Social History Main Topics  . Smoking status: Never Smoker   . Smokeless tobacco: Never Used  . Alcohol Use: No  . Drug Use: Yes    Special: Marijuana     Comment: occasionally  . Sexual Activity: Not on file   Other Topics Concern  . Not on file   Social History Narrative  . No narrative on file    Review of Systems: Constitutional: Negative for fever, chills, diaphoresis, activity change, appetite change and fatigue. HENT: Negative for ear pain,  nosebleeds, congestion, facial swelling, rhinorrhea, neck pain, neck stiffness and ear discharge.  Eyes: Negative for pain, discharge, redness, itching and visual disturbance. Respiratory: Negative for cough, choking, chest tightness, shortness of breath, wheezing and stridor.  Cardiovascular: Negative for chest pain, palpitations and leg swelling. Gastrointestinal: Negative for abdominal distention. Genitourinary: Negative for dysuria, urgency, frequency, hematuria, flank pain, decreased urine volume, difficulty urinating and dyspareunia.  Musculoskeletal: Negative for back pain, joint swelling, arthralgias and gait problem. Neurological: Negative for dizziness, tremors, seizures, syncope, facial asymmetry, speech difficulty, weakness, light-headedness, numbness and headaches.  Hematological: Negative for adenopathy. Does not bruise/bleed easily. Psychiatric/Behavioral: Negative for hallucinations, behavioral problems, confusion, dysphoric mood, decreased concentration and agitation.    Objective:   Filed Vitals:   03/02/14 1634  BP: 126/75  Pulse: 65  Temp: 98.2 F (36.8 C)  Resp: 18    Physical Exam: Constitutional: Patient appears well-developed and well-nourished. No distress. Neck: Normal ROM. Neck supple. No JVD. No tracheal deviation. No thyromegaly. CVS: RRR, S1/S2 +, no murmurs, no gallops, no carotid bruit.  Pulmonary: Effort and breath sounds normal, no stridor, rhonchi, wheezes, rales.  Abdominal: Soft. BS +,  no distension, tenderness, rebound or guarding.   + tenderness generalized  Musculoskeletal: fourth finger of right  hand is unstable, unable to move with active ROM. Lymphadenopathy: No lymphadenopathy noted, cervical, Neuro: Alert. Normal reflexes, muscle tone coordination. No cranial nerve deficit. Skin: Skin is warm and dry. No rash noted. Not diaphoretic. No erythema. No pallor. Psychiatric: Normal mood and affect. Behavior, judgment, thought content  normal.  Lab Results  Component Value Date   WBC 9.8 02/09/2014   HGB 12.1* 02/09/2014   HCT 36.8* 02/09/2014   MCV 75.7* 02/09/2014   PLT 353 02/09/2014   Lab Results  Component Value Date   CREATININE 0.79 02/09/2014   BUN 33* 02/09/2014   NA 141 02/09/2014   K 3.8 02/09/2014   CL 102 02/09/2014   CO2 21 02/09/2014    No results found for this basename: HGBA1C   Lipid Panel     Component Value Date/Time   CHOL 142 08/19/2013 1152   TRIG 68 08/19/2013 1152   HDL 47 08/19/2013 1152   CHOLHDL 3.0 08/19/2013 1152   VLDL 14 08/19/2013 1152   LDLCALC 81 08/19/2013 1152       Assessment and plan:   Levi Palmer was seen today for hand pain and abdominal pain.  Diagnoses and associated orders for this visit:  Finger fracture, right, with delayed healing, subsequent encounter - Ambulatory referral to Sports Medicine - DG Hand Complete Right; Future  Abdominal pain, chronic, epigastric - Ambulatory referral to Gastroenterology  Cerumen impaction, bilateral - Ear Lavage   Return if symptoms worsen or fail to improve.       Holland Commons, NP-C [facility] [phone] 03/02/2014, 5:18 PM

## 2014-03-07 ENCOUNTER — Encounter: Payer: Self-pay | Admitting: Internal Medicine

## 2014-03-08 ENCOUNTER — Ambulatory Visit (INDEPENDENT_AMBULATORY_CARE_PROVIDER_SITE_OTHER): Payer: Self-pay | Admitting: Sports Medicine

## 2014-03-08 ENCOUNTER — Encounter: Payer: Self-pay | Admitting: Sports Medicine

## 2014-03-08 VITALS — BP 120/82 | HR 94 | Ht 67.0 in | Wt 135.0 lb

## 2014-03-08 DIAGNOSIS — M542 Cervicalgia: Secondary | ICD-10-CM

## 2014-03-08 DIAGNOSIS — S62618P Displaced fracture of proximal phalanx of other finger, subsequent encounter for fracture with malunion: Secondary | ICD-10-CM

## 2014-03-08 DIAGNOSIS — IMO0002 Reserved for concepts with insufficient information to code with codable children: Secondary | ICD-10-CM

## 2014-03-08 NOTE — Patient Instructions (Signed)
We are referring used to [facility] to be seen by a Hydrographic surveyor. You will need to fill out the financial aid application.

## 2014-03-08 NOTE — Assessment & Plan Note (Signed)
This is a surgical issue and needs to be seen by hand surgery specialist. We have given him information and schedule him with [facility] to be seen through their charity care program. Discussed that without surgical intervention this will likely not improve he will have significant loss of function as well as persistent deformity. I do not have any concerns for acute compartment syndrome at this time

## 2014-03-08 NOTE — Progress Notes (Signed)
  ZIV WELCHEL - 28 y.o. male MRN [id number]  Date of birth: [birth year]  SUBJECTIVE:  Including CC & ROS.  The patient is referred for for evaluation of: Right hand/finger injury: Patient had a complicated proximal fourth phalanx fracture on 11/24/2013. Circumstances involved an anxiety attack where he reportedly hit an object in front of him. He's been unable to followup with hand surgery due to insurance consultations and has been seen in emergency department as well as by his PCP on multiple occasions. He has undergone a digital block as well as attempted closed reduction in the emergency department due to concerns for compartment syndrome at that time. He did have improvement in symptoms she was complaining of regarding compartment syndrome but has had persistent pain and loss of function as well as deformity is referred here for our opinion.  Neck pain: Patient reports a four-day history of posterior midline neck pain with no radicular symptoms worse with rotation. He denies any upper Trinity numbness, tingling or weakness. He has had prior episodes similar to this that are typically worse after increased exertion. He does report we can have changed the battery in his car axis may exacerbated things. He is not trying any therapeutic exercises or anti-inflammatories.    HISTORY: Past Medical, Surgical, Social, and Family History Reviewed & Updated per EMR. Pertinent Historical Findings include: History of cyclic vomiting syndrome with prior pneumomediastinum from suspected esophageal perforation. Denies tobacco use but does report twice weekly marijuana use.  DATA REVIEWED: X-rays 11/24/2013, 12/20/2013, 01/04/2014, 01/05/2014: Oblique fracture of proximal third of the proximal phalanx of the fourth digit that has progressed to being impacted and anteriorly displaced with sclerosis and early periosteal reaction.  PHYSICAL EXAM:  VS: BP:120/82 mmHg  HR:94bpm  TEMP: ( )  RESP:   HT:5\' 7"   (170.2 cm)   WT:135 lb (61.236 kg)  BMI:21.2 PHYSICAL EXAM: GENERAL:  Adult Caucasian male male. In no discomfort; no respiratory distress  PSYCH:  alert and appropriate, good insight  Right hand Hand Exam:        Gen/Palp:  Overall anteriorly and ulnar deviated fourth finger       ROM/MST:  Loss of 4th finger flexion and extension actively. He does have tenderness over the base of proximal phalanx. Loss of DIP flexion but able to isolate, extension intact.                     NV:  Capillary refill of less than 2 seconds. Some dysesthesia. Neck Exam:        Gen/Palp:  para cervical spinal tenderness. No significant midline tenderness. No palpable deformity.       ROM/MST:  Limited rotation to the left.                      NV:  Bilateral upper extremity strength 5+ out of 5 in all myotomes. Sensation is intact to light touch. Upper extremity DTRs 2+ out of 4.                 Tests:  Bilateral negative Spurling's test   ASSESSMENT & PLAN: See problem based charting & AVS for pt instructions.

## 2014-03-08 NOTE — Assessment & Plan Note (Addendum)
Acute muscle skeletal neck pain secondary to activities of the weekend and discussed conservative care including range of motion exercises and ibuprofen when necessary. Heat and ice as needed. Will followup if not improving suspect this will get better on its own. No indication for imaging at this time.

## 2014-03-19 ENCOUNTER — Emergency Department (HOSPITAL_COMMUNITY)
Admission: EM | Admit: 2014-03-19 | Discharge: 2014-03-19 | Disposition: A | Discharge status: Home / Self Care | Payer: Self-pay | Attending: Emergency Medicine | Admitting: Emergency Medicine

## 2014-03-19 ENCOUNTER — Emergency Department (HOSPITAL_COMMUNITY): Payer: Self-pay

## 2014-03-19 ENCOUNTER — Encounter (HOSPITAL_COMMUNITY): Payer: Self-pay | Admitting: Emergency Medicine

## 2014-03-19 DIAGNOSIS — R1110 Vomiting, unspecified: Secondary | ICD-10-CM | POA: Insufficient documentation

## 2014-03-19 DIAGNOSIS — M25539 Pain in unspecified wrist: Secondary | ICD-10-CM | POA: Insufficient documentation

## 2014-03-19 DIAGNOSIS — R1084 Generalized abdominal pain: Secondary | ICD-10-CM | POA: Insufficient documentation

## 2014-03-19 DIAGNOSIS — Z8659 Personal history of other mental and behavioral disorders: Secondary | ICD-10-CM | POA: Insufficient documentation

## 2014-03-19 DIAGNOSIS — M47812 Spondylosis without myelopathy or radiculopathy, cervical region: Secondary | ICD-10-CM | POA: Insufficient documentation

## 2014-03-19 DIAGNOSIS — G8929 Other chronic pain: Secondary | ICD-10-CM | POA: Insufficient documentation

## 2014-03-19 LAB — COMPREHENSIVE METABOLIC PANEL
ALK PHOS: 85 U/L (ref 39–117)
ALT: 17 U/L (ref 0–53)
ANION GAP: 16 — AB (ref 5–15)
AST: 20 U/L (ref 0–37)
Albumin: 4.6 g/dL (ref 3.5–5.2)
BILIRUBIN TOTAL: 0.2 mg/dL — AB (ref 0.3–1.2)
BUN: 14 mg/dL (ref 6–23)
CO2: 22 mEq/L (ref 19–32)
Calcium: 10.1 mg/dL (ref 8.4–10.5)
Chloride: 101 mEq/L (ref 96–112)
Creatinine, Ser: 0.77 mg/dL (ref 0.50–1.35)
GFR calc Af Amer: >90 mL/min (ref >90)
GFR calc non Af Amer: >90 mL/min (ref >90)
GLUCOSE: 108 mg/dL — AB (ref 70–99)
Potassium: 4.4 mEq/L (ref 3.7–5.3)
Sodium: 139 mEq/L (ref 137–147)
Total Protein: 8.7 g/dL — ABNORMAL HIGH (ref 6.0–8.3)

## 2014-03-19 LAB — CBC WITH DIFFERENTIAL/PLATELET
Basophils Absolute: 0.1 10*3/uL (ref 0.0–0.1)
Basophils Relative: 0 % (ref 0–1)
Eosinophils Absolute: 0.3 10*3/uL (ref 0.0–0.7)
Eosinophils Relative: 2 % (ref 0–5)
HCT: 41.5 % (ref 39.0–52.0)
HEMOGLOBIN: 13.4 g/dL (ref 13.0–17.0)
LYMPHS PCT: 14 % (ref 12–46)
Lymphs Abs: 2.1 10*3/uL (ref 0.7–4.0)
MCH: 24.9 pg — ABNORMAL LOW (ref 26.0–34.0)
MCHC: 32.3 g/dL (ref 30.0–36.0)
MCV: 77.1 fL — ABNORMAL LOW (ref 78.0–100.0)
MONOS PCT: 6 % (ref 3–12)
Monocytes Absolute: 0.9 10*3/uL (ref 0.1–1.0)
NEUTROS ABS: 11.7 10*3/uL — AB (ref 1.7–7.7)
Neutrophils Relative %: 78 % — ABNORMAL HIGH (ref 43–77)
Platelets: 338 10*3/uL (ref 150–400)
RBC: 5.38 MIL/uL (ref 4.22–5.81)
RDW: 15.2 % (ref 11.5–15.5)
WBC: 15 10*3/uL — AB (ref 4.0–10.5)

## 2014-03-19 LAB — URINALYSIS, ROUTINE W REFLEX MICROSCOPIC
Bilirubin Urine: NEGATIVE
GLUCOSE, UA: NEGATIVE mg/dL
HGB URINE DIPSTICK: NEGATIVE
Ketones, ur: NEGATIVE mg/dL
Leukocytes, UA: NEGATIVE
Nitrite: NEGATIVE
PROTEIN: NEGATIVE mg/dL
SPECIFIC GRAVITY, URINE: 1.018 (ref 1.005–1.030)
Urobilinogen, UA: 0.2 mg/dL (ref 0.0–1.0)
pH: 8 (ref 5.0–8.0)

## 2014-03-19 LAB — LIPASE, BLOOD: Lipase: 16 U/L (ref 11–59)

## 2014-03-19 MED ORDER — LORAZEPAM 2 MG/ML IJ SOLN
1.0000 mg | Freq: Once | INTRAMUSCULAR | Status: AC
  Administered 2014-03-19: 1 mg via INTRAVENOUS
  Filled 2014-03-19: qty 1

## 2014-03-19 MED ORDER — SODIUM CHLORIDE 0.9 % IV SOLN: INTRAVENOUS | Status: DC

## 2014-03-19 MED ORDER — SODIUM CHLORIDE 0.9 % IV BOLUS (SEPSIS)
1000.0000 mL | Freq: Once | INTRAVENOUS | Status: AC
  Administered 2014-03-19: 1000 mL via INTRAVENOUS

## 2014-03-19 MED ORDER — ONDANSETRON HCL 4 MG/2ML IJ SOLN
4.0000 mg | Freq: Once | INTRAMUSCULAR | Status: AC
  Administered 2014-03-19: 4 mg via INTRAVENOUS
  Filled 2014-03-19: qty 2

## 2014-03-19 MED ORDER — HYDROMORPHONE HCL PF 1 MG/ML IJ SOLN
1.0000 mg | Freq: Once | INTRAMUSCULAR | Status: AC
  Administered 2014-03-19: 1 mg via INTRAVENOUS
  Filled 2014-03-19: qty 1

## 2014-03-19 MED ORDER — PREDNISONE 10 MG PO TABS: 20.0000 mg | ORAL_TABLET | Freq: Every day | ORAL | Status: DC

## 2014-03-19 NOTE — ED Notes (Signed)
Pt with  aggressive behavior, verbally abusive to this Clinical research associate witnessed by [employer].. Pt pacing outside room. Security called non emergent to assist. Pt requesting to speak to charge. Air cabin crew in room to speak with pt. Discharged as written.

## 2014-03-19 NOTE — ED Notes (Signed)
Dr. Freida Busman made aware of pt d/c delay and c/o neck pain.

## 2014-03-19 NOTE — ED Notes (Signed)
Pt moaning on assessment.  Describes multiple complaints including neck pain which he has had before..  Stops moaning on questioning and able to make full sentences.

## 2014-03-19 NOTE — ED Notes (Signed)
Pt c/o abdominal pain, emesis, neck pain radiating to bilateral arms.

## 2014-03-19 NOTE — Discharge Instructions (Signed)
Abdominal Pain Many things can cause abdominal pain. Usually, abdominal pain is not caused by a disease and will improve without treatment. It can often be observed and treated at home. Your health care provider will do a physical exam and possibly order blood tests and X-rays to help determine the seriousness of your pain. However, in many cases, more time must pass before a clear cause of the pain can be found. Before that point, your health care provider may not know if you need more testing or further treatment. HOME CARE INSTRUCTIONS  Monitor your abdominal pain for any changes. The following actions may help to alleviate any discomfort you are experiencing:  Only take over-the-counter or prescription medicines as directed by your health care provider.  Do not take laxatives unless directed to do so by your health care provider.  Try a clear liquid diet (broth, tea, or water) as directed by your health care provider. Slowly move to a bland diet as tolerated. SEEK MEDICAL CARE IF:  You have unexplained abdominal pain.  You have abdominal pain associated with nausea or diarrhea.  You have pain when you urinate or have a bowel movement.  You experience abdominal pain that wakes you in the night.  You have abdominal pain that is worsened or improved by eating food.  You have abdominal pain that is worsened with eating fatty foods.  You have a fever. SEEK IMMEDIATE MEDICAL CARE IF:   Your pain does not go away within 2 hours.  You keep throwing up (vomiting).  Your pain is felt only in portions of the abdomen, such as the right side or the left lower portion of the abdomen.  You pass bloody or black tarry stools. MAKE SURE YOU:  Understand these instructions.   Will watch your condition.   Will get help right away if you are not doing well or get worse.  Document Released: 04/02/2005 Document Revised: 06/28/2013 Document Reviewed: 03/02/2013 [facility] Patient Information  2015 [place], [place]. This information is not intended to replace advice given to you by your health care provider. Make sure you discuss any questions you have with your health care provider. Degenerative Disk Disease Degenerative disk disease is a condition caused by the changes that occur in the cushions of the backbone (spinal disks) as you grow older. Spinal disks are soft and compressible disks located between the bones of the spine (vertebrae). They act like shock absorbers. Degenerative disk disease can affect the whole spine. However, the neck and lower back are most commonly affected. Many changes can occur in the spinal disks with aging, such as:  The spinal disks may dry and shrink.  Small tears may occur in the tough, outer covering of the disk (annulus).  The disk space may become smaller due to loss of water.  Abnormal growths in the bone (spurs) may occur. This can put pressure on the nerve roots exiting the spinal canal, causing pain.  The spinal canal may become narrowed. CAUSES  Degenerative disk disease is a condition caused by the changes that occur in the spinal disks with aging. The exact cause is not known, but there is a genetic basis for many patients. Degenerative changes can occur due to loss of fluid in the disk. This makes the disk thinner and reduces the space between the backbones. Small cracks can develop in the outer layer of the disk. This can lead to the breakdown of the disk. You are more likely to get degenerative disk disease if  you are overweight. Smoking cigarettes and doing heavy work such as weightlifting can also increase your risk of this condition. Degenerative changes can start after a sudden injury. Growth of bone spurs can compress the nerve roots and cause pain.  SYMPTOMS  The symptoms vary from person to person. Some people may have no pain, while others have severe pain. The pain may be so severe that it can limit your activities. The location of the  pain depends on the part of your backbone that is affected. You will have neck or arm pain if a disk in the neck area is affected. You will have pain in your back, buttocks, or legs if a disk in the lower back is affected. The pain becomes worse while bending, reaching up, or with twisting movements. The pain may start gradually and then get worse as time passes. It may also start after a major or minor injury. You may feel numbness or tingling in the arms or legs.  DIAGNOSIS  Your caregiver will ask you about your symptoms and about activities or habits that may cause the pain. He or she may also ask about any injuries, diseases, or treatments you have had earlier. Your caregiver will examine you to check for the range of movement that is possible in the affected area, to check for strength in your extremities, and to check for sensation in the areas of the arms and legs supplied by different nerve roots. An X-ray of the spine may be taken. Your caregiver may suggest other imaging tests, such as magnetic resonance imaging (MRI), if needed.  TREATMENT  Treatment includes rest, modifying your activities, and applying ice and heat. Your caregiver may prescribe medicines to reduce your pain and may ask you to do some exercises to strengthen your back. In some cases, you may need surgery. You and your caregiver will decide on the treatment that is best for you. HOME CARE INSTRUCTIONS   Follow proper lifting and walking techniques as advised by your caregiver.  Maintain good posture.  Exercise regularly as advised.  Perform relaxation exercises.  Change your sitting, standing, and sleeping habits as advised. Change positions frequently.  Lose weight as advised.  Stop smoking if you smoke.  Wear supportive footwear. SEEK MEDICAL CARE IF:  Your pain does not go away within 1 to 4 weeks. SEEK IMMEDIATE MEDICAL CARE IF:   Your pain is severe.  You notice weakness in your arms, hands, or  legs.  You begin to lose control of your bladder or bowel movements. MAKE SURE YOU:   Understand these instructions.  Will watch your condition.  Will get help right away if you are not doing well or get worse. Document Released: 04/20/2007 Document Revised: 09/15/2011 Document Reviewed: 10/25/2013 [facility] Patient Information 2015 [place], [place]. This information is not intended to replace advice given to you by your health care provider. Make sure you discuss any questions you have with your health care provider.

## 2014-03-19 NOTE — ED Provider Notes (Addendum)
CSN: [id number]     Arrival date & time 03/19/14  1052 History   First MD Initiated Contact with Patient 03/19/14 1146     Chief Complaint  Patient presents with  . Abdominal Pain  . Neck Pain  . Arm Pain  . Emesis     (Consider location/radiation/quality/duration/timing/severity/associated sxs/prior Treatment) HPI Comments: Patient here complaining of worsening chronic neck abdominal pain. Symptoms have been for several days and his abdominal pain is the most pressing issue at this time. He was recently seen for his chronic neck pain according to the records. Abdominal pain is poorly localized and is diffuse. He has had some bilious emesis with this but denies any bloody stools. No fever or chills. Symptoms persisted. No treatment used them prior to arrival. Nothing makes her symptoms better.  Patient is a 28 y.o. male presenting with abdominal pain, neck pain, arm pain, and vomiting. The history is provided by the patient.  Abdominal Pain Associated symptoms: vomiting   Neck Pain Arm Pain Associated symptoms include abdominal pain.  Emesis Associated symptoms: abdominal pain     Past Medical History  Diagnosis Date  . Medical history non-contributory   . Anxiety   . Panic attack as reaction to stress    Past Surgical History  Procedure Laterality Date  . Cholecystectomy    . Tonsillectomy    . Incise and drain abcess     No family history on file. History  Substance Use Topics  . Smoking status: Never Smoker   . Smokeless tobacco: Never Used  . Alcohol Use: No    Review of Systems  Gastrointestinal: Positive for vomiting and abdominal pain.  Musculoskeletal: Positive for neck pain.  All other systems reviewed and are negative.     Allergies  Tramadol and Zoloft  Home Medications   Prior to Admission medications   Medication Sig Start Date End Date Taking? Authorizing Provider  ibuprofen (ADVIL,MOTRIN) 200 MG tablet Take 400-600 mg by mouth every 6 (six)  hours as needed for mild pain or moderate pain.   Yes Historical Provider, MD   BP 157/102  Pulse 107  Temp(Src) 97.5 F (36.4 C) (Oral)  Resp 20  SpO2 100% Physical Exam  Nursing note and vitals reviewed. Constitutional: He is oriented to person, place, and time. He appears well-developed and well-nourished.  Non-toxic appearance. No distress.  HENT:  Head: Normocephalic and atraumatic.  Eyes: Conjunctivae, EOM and lids are normal. Pupils are equal, round, and reactive to light.  Neck: Normal range of motion. Neck supple. No rigidity. No tracheal deviation present. No mass present.  Cardiovascular: Normal rate, regular rhythm and normal heart sounds.  Exam reveals no gallop.   No murmur heard. Pulmonary/Chest: Effort normal and breath sounds normal. No stridor. No respiratory distress. He has no decreased breath sounds. He has no wheezes. He has no rhonchi. He has no rales.  Abdominal: Soft. Normal appearance and bowel sounds are normal. He exhibits no distension. There is generalized tenderness. There is no rigidity, no rebound, no guarding and no CVA tenderness.    Musculoskeletal: Normal range of motion. He exhibits no edema and no tenderness.  Neurological: He is alert and oriented to person, place, and time. He has normal strength. No cranial nerve deficit or sensory deficit. GCS eye subscore is 4. GCS verbal subscore is 5. GCS motor subscore is 6.  Skin: Skin is warm and dry. No abrasion and no rash noted.  Psychiatric: He has a normal mood and affect.  His speech is normal and behavior is normal.    ED Course  Procedures (including critical care time) Labs Review Labs Reviewed  CBC WITH DIFFERENTIAL - Abnormal; Notable for the following:    WBC 15.0 (*)    MCV 77.1 (*)    MCH 24.9 (*)    Neutrophils Relative % 78 (*)    Neutro Abs 11.7 (*)    All other components within normal limits  COMPREHENSIVE METABOLIC PANEL  LIPASE, BLOOD  URINALYSIS, ROUTINE W REFLEX MICROSCOPIC     Imaging Review No results found.   EKG Interpretation None      MDM   Final diagnoses:  None    Pt feels better after meds, no acute surgical process ID, stable for d/c  3:30 PM Patient also complained of increased pain in his neck. Procedure neck showed DJD. and patient given prescription for prednisone.  Toy Baker, MD 03/19/14 1349  Toy Baker, MD 03/19/14 [phone]

## 2014-03-20 ENCOUNTER — Encounter: Payer: Self-pay | Admitting: Gastroenterology

## 2014-03-28 ENCOUNTER — Emergency Department (HOSPITAL_COMMUNITY)
Admission: EM | Admit: 2014-03-28 | Discharge: 2014-03-28 | Disposition: A | Discharge status: Home / Self Care | Payer: No Typology Code available for payment source | Attending: Emergency Medicine | Admitting: Emergency Medicine

## 2014-03-28 ENCOUNTER — Encounter (HOSPITAL_COMMUNITY): Payer: Self-pay | Admitting: Emergency Medicine

## 2014-03-28 DIAGNOSIS — K029 Dental caries, unspecified: Secondary | ICD-10-CM | POA: Insufficient documentation

## 2014-03-28 DIAGNOSIS — K08109 Complete loss of teeth, unspecified cause, unspecified class: Secondary | ICD-10-CM | POA: Insufficient documentation

## 2014-03-28 DIAGNOSIS — Z8659 Personal history of other mental and behavioral disorders: Secondary | ICD-10-CM | POA: Insufficient documentation

## 2014-03-28 DIAGNOSIS — K089 Disorder of teeth and supporting structures, unspecified: Secondary | ICD-10-CM | POA: Insufficient documentation

## 2014-03-28 DIAGNOSIS — K047 Periapical abscess without sinus: Secondary | ICD-10-CM | POA: Insufficient documentation

## 2014-03-28 DIAGNOSIS — Z79899 Other long term (current) drug therapy: Secondary | ICD-10-CM | POA: Insufficient documentation

## 2014-03-28 MED ORDER — AMOXICILLIN 500 MG PO CAPS
1000.0000 mg | ORAL_CAPSULE | Freq: Once | ORAL | Status: AC
  Administered 2014-03-28: 1000 mg via ORAL
  Filled 2014-03-28: qty 2

## 2014-03-28 MED ORDER — OXYCODONE-ACETAMINOPHEN 5-325 MG PO TABS: 1.0000 | ORAL_TABLET | ORAL | Status: DC | PRN

## 2014-03-28 MED ORDER — AMOXICILLIN 500 MG PO CAPS: 1000.0000 mg | ORAL_CAPSULE | Freq: Two times a day (BID) | ORAL | Status: DC

## 2014-03-28 MED ORDER — OXYCODONE-ACETAMINOPHEN 5-325 MG PO TABS
1.0000 | ORAL_TABLET | Freq: Once | ORAL | Status: AC
  Administered 2014-03-28: 1 via ORAL
  Filled 2014-03-28: qty 1

## 2014-03-28 NOTE — ED Notes (Signed)
Pt reports left bottom molar pain that started this morning. Mild swelling noted to patients left cheek/jaw area. Poor dental hygiene, broken and missing teeth noted. Denies fever/nausea or vomiting but he does report a headache.

## 2014-03-28 NOTE — Discharge Instructions (Signed)
Dental Abscess A dental abscess is a collection of infected fluid (pus) from a bacterial infection in the inner part of the tooth (pulp). It usually occurs at the end of the tooth's root.  CAUSES   Severe tooth decay.  Trauma to the tooth that allows bacteria to enter into the pulp, such as a broken or chipped tooth. SYMPTOMS   Severe pain in and around the infected tooth.  Swelling and redness around the abscessed tooth or in the mouth or face.  Tenderness.  Pus drainage.  Bad breath.  Bitter taste in the mouth.  Difficulty swallowing.  Difficulty opening the mouth.  Nausea.  Vomiting.  Chills.  Swollen neck glands. DIAGNOSIS   A medical and dental history will be taken.  An examination will be performed by tapping on the abscessed tooth.  X-rays may be taken of the tooth to identify the abscess. TREATMENT The goal of treatment is to eliminate the infection. You may be prescribed antibiotic medicine to stop the infection from spreading. A root canal may be performed to save the tooth. If the tooth cannot be saved, it may be pulled (extracted) and the abscess may be drained.  HOME CARE INSTRUCTIONS  Only take over-the-counter or prescription medicines for pain, fever, or discomfort as directed by your caregiver.  Rinse your mouth (gargle) often with salt water ( tsp salt in 8 oz [250 ml] of warm water) to relieve pain or swelling.  Do not drive after taking pain medicine (narcotics).  Do not apply heat to the outside of your face.  Return to your dentist for further treatment as directed. SEEK MEDICAL CARE IF:  Your pain is not helped by medicine.  Your pain is getting worse instead of better. SEEK IMMEDIATE MEDICAL CARE IF:  You have a fever or persistent symptoms for more than 2-3 days.  You have a fever and your symptoms suddenly get worse.  You have chills or a very bad headache.  You have problems breathing or swallowing.  You have trouble  opening your mouth.  You have swelling in the neck or around the eye. Document Released: 06/23/2005 Document Revised: 03/17/2012 Document Reviewed: 10/01/2010 [facility] Patient Information 2015 [place], [place]. This information is not intended to replace advice given to you by your health care provider. Make sure you discuss any questions you have with your health care provider.  Amoxicillin capsules or tablets What is this medicine? AMOXICILLIN (a mox i SIL in) is a penicillin antibiotic. It is used to treat certain kinds of bacterial infections. It will not work for colds, flu, or other viral infections. This medicine may be used for other purposes; ask your health care provider or pharmacist if you have questions. COMMON BRAND NAME(S): Amoxil, Moxilin, Sumox, Trimox What should I tell my health care provider before I take this medicine? They need to know if you have any of these conditions: -asthma -kidney disease -an unusual or allergic reaction to amoxicillin, other penicillins, cephalosporin antibiotics, other medicines, foods, dyes, or preservatives -pregnant or trying to get pregnant -breast-feeding How should I use this medicine? Take this medicine by mouth with a glass of water. Follow the directions on your prescription label. You may take this medicine with food or on an empty stomach. Take your medicine at regular intervals. Do not take your medicine more often than directed. Take all of your medicine as directed even if you think your are better. Do not skip doses or stop your medicine early. Talk to your  pediatrician regarding the use of this medicine in children. While this drug may be prescribed for selected conditions, precautions do apply. Overdosage: If you think you have taken too much of this medicine contact a poison control center or emergency room at once. NOTE: This medicine is only for you. Do not share this medicine with others. What if I miss a dose? If you miss a  dose, take it as soon as you can. If it is almost time for your next dose, take only that dose. Do not take double or extra doses. What may interact with this medicine? -amiloride -birth control pills -chloramphenicol -macrolides -probenecid -sulfonamides -tetracyclines This list may not describe all possible interactions. Give your health care provider a list of all the medicines, herbs, non-prescription drugs, or dietary supplements you use. Also tell them if you smoke, drink alcohol, or use illegal drugs. Some items may interact with your medicine. What should I watch for while using this medicine? Tell your doctor or health care professional if your symptoms do not improve in 2 or 3 days. Take all of the doses of your medicine as directed. Do not skip doses or stop your medicine early. If you are diabetic, you may get a false positive result for sugar in your urine with certain brands of urine tests. Check with your doctor. Do not treat diarrhea with over-the-counter products. Contact your doctor if you have diarrhea that lasts more than 2 days or if the diarrhea is severe and watery. What side effects may I notice from receiving this medicine? Side effects that you should report to your doctor or health care professional as soon as possible: -allergic reactions like skin rash, itching or hives, swelling of the face, lips, or tongue -breathing problems -dark urine -redness, blistering, peeling or loosening of the skin, including inside the mouth -seizures -severe or watery diarrhea -trouble passing urine or change in the amount of urine -unusual bleeding or bruising -unusually weak or tired -yellowing of the eyes or skin Side effects that usually do not require medical attention (report to your doctor or health care professional if they continue or are bothersome): -dizziness -headache -stomach upset -trouble sleeping This list may not describe all possible side effects. Call your  doctor for medical advice about side effects. You may report side effects to FDA at 1-800-FDA-1088. Where should I keep my medicine? Keep out of the reach of children. Store between 68 and 77 degrees F (20 and 25 degrees C). Keep bottle closed tightly. Throw away any unused medicine after the expiration date. NOTE: This sheet is a summary. It may not cover all possible information. If you have questions about this medicine, talk to your doctor, pharmacist, or health care provider.  2015, [employer] Standard. (2007-09-14 14:10:59)  Acetaminophen; Oxycodone tablets What is this medicine? ACETAMINOPHEN; OXYCODONE (a set a MEE noe fen; ox i KOE done) is a pain reliever. It is used to treat mild to moderate pain. This medicine may be used for other purposes; ask your health care provider or pharmacist if you have questions. COMMON BRAND NAME(S): Endocet, Magnacet, Narvox, Percocet, Perloxx, Primalev, Primlev, Roxicet, Xolox What should I tell my health care provider before I take this medicine? They need to know if you have any of these conditions: -brain tumor -Crohn's disease, inflammatory bowel disease, or ulcerative colitis -drug abuse or addiction -head injury -heart or circulation problems -if you often drink alcohol -kidney disease or problems going to the bathroom -liver disease -lung disease, asthma,  or breathing problems -an unusual or allergic reaction to acetaminophen, oxycodone, other opioid analgesics, other medicines, foods, dyes, or preservatives -pregnant or trying to get pregnant -breast-feeding How should I use this medicine? Take this medicine by mouth with a full glass of water. Follow the directions on the prescription label. Take your medicine at regular intervals. Do not take your medicine more often than directed. Talk to your pediatrician regarding the use of this medicine in children. Special care may be needed. Patients over 69 years old may have a stronger  reaction and need a smaller dose. Overdosage: If you think you have taken too much of this medicine contact a poison control center or emergency room at once. NOTE: This medicine is only for you. Do not share this medicine with others. What if I miss a dose? If you miss a dose, take it as soon as you can. If it is almost time for your next dose, take only that dose. Do not take double or extra doses. What may interact with this medicine? -alcohol -antihistamines -barbiturates like amobarbital, butalbital, butabarbital, methohexital, pentobarbital, phenobarbital, thiopental, and secobarbital -benztropine -drugs for bladder problems like solifenacin, trospium, oxybutynin, tolterodine, hyoscyamine, and methscopolamine -drugs for breathing problems like ipratropium and tiotropium -drugs for certain stomach or intestine problems like propantheline, homatropine methylbromide, glycopyrrolate, atropine, belladonna, and dicyclomine -general anesthetics like etomidate, ketamine, nitrous oxide, propofol, desflurane, enflurane, halothane, isoflurane, and sevoflurane -medicines for depression, anxiety, or psychotic disturbances -medicines for sleep -muscle relaxants -naltrexone -narcotic medicines (opiates) for pain -phenothiazines like perphenazine, thioridazine, chlorpromazine, mesoridazine, fluphenazine, prochlorperazine, promazine, and trifluoperazine -scopolamine -tramadol -trihexyphenidyl This list may not describe all possible interactions. Give your health care provider a list of all the medicines, herbs, non-prescription drugs, or dietary supplements you use. Also tell them if you smoke, drink alcohol, or use illegal drugs. Some items may interact with your medicine. What should I watch for while using this medicine? Tell your doctor or health care professional if your pain does not go away, if it gets worse, or if you have new or a different type of pain. You may develop tolerance to the  medicine. Tolerance means that you will need a higher dose of the medication for pain relief. Tolerance is normal and is expected if you take this medicine for a long time. Do not suddenly stop taking your medicine because you may develop a severe reaction. Your body becomes used to the medicine. This does NOT mean you are addicted. Addiction is a behavior related to getting and using a drug for a non-medical reason. If you have pain, you have a medical reason to take pain medicine. Your doctor will tell you how much medicine to take. If your doctor wants you to stop the medicine, the dose will be slowly lowered over time to avoid any side effects. You may get drowsy or dizzy. Do not drive, use machinery, or do anything that needs mental alertness until you know how this medicine affects you. Do not stand or sit up quickly, especially if you are an older patient. This reduces the risk of dizzy or fainting spells. Alcohol may interfere with the effect of this medicine. Avoid alcoholic drinks. There are different types of narcotic medicines (opiates) for pain. If you take more than one type at the same time, you may have more side effects. Give your health care provider a list of all medicines you use. Your doctor will tell you how much medicine to take. Do not take more medicine  than directed. Call emergency for help if you have problems breathing. The medicine will cause constipation. Try to have a bowel movement at least every 2 to 3 days. If you do not have a bowel movement for 3 days, call your doctor or health care professional. Do not take Tylenol (acetaminophen) or medicines that have acetaminophen with this medicine. Too much acetaminophen can be very dangerous. Many nonprescription medicines contain acetaminophen. Always read the labels carefully to avoid taking more acetaminophen. What side effects may I notice from receiving this medicine? Side effects that you should report to your doctor or health  care professional as soon as possible: -allergic reactions like skin rash, itching or hives, swelling of the face, lips, or tongue -breathing difficulties, wheezing -confusion -light headedness or fainting spells -severe stomach pain -unusually weak or tired -yellowing of the skin or the whites of the eyes Side effects that usually do not require medical attention (report to your doctor or health care professional if they continue or are bothersome): -dizziness -drowsiness -nausea -vomiting This list may not describe all possible side effects. Call your doctor for medical advice about side effects. You may report side effects to FDA at 1-800-FDA-1088. Where should I keep my medicine? Keep out of the reach of children. This medicine can be abused. Keep your medicine in a safe place to protect it from theft. Do not share this medicine with anyone. Selling or giving away this medicine is dangerous and against the law. Store at room temperature between 20 and 25 degrees C (68 and 77 degrees F). Keep container tightly closed. Protect from light. This medicine may cause accidental overdose and death if it is taken by other adults, children, or pets. Flush any unused medicine down the toilet to reduce the chance of harm. Do not use the medicine after the expiration date. NOTE: This sheet is a summary. It may not cover all possible information. If you have questions about this medicine, talk to your doctor, pharmacist, or health care provider.  2015, [employer] Standard. (2013-02-14 13:17:35)

## 2014-03-28 NOTE — ED Provider Notes (Signed)
CSN: [id number]     Arrival date & time 03/28/14  4098 History   First MD Initiated Contact with Patient 03/28/14 [phone]     Chief Complaint  Patient presents with  . Dental Pain     (Consider location/radiation/quality/duration/timing/severity/associated sxs/prior Treatment) Patient is a 28 y.o. male presenting with tooth pain. The history is provided by the patient.  Dental Pain He comes in with pain in the left lower tooth with swelling of the jaw. The tooth has been painful for about 1-2 weeks but has been getting more painful. Swelling started about 2 days ago but got significantly worse today. Pain is severe and he rates it a 10/10. He has not taken anything for pain. He denies fever or chills.  Past Medical History  Diagnosis Date  . Medical history non-contributory   . Anxiety   . Panic attack as reaction to stress    Past Surgical History  Procedure Laterality Date  . Cholecystectomy    . Tonsillectomy    . Incise and drain abcess     No family history on file. History  Substance Use Topics  . Smoking status: Never Smoker   . Smokeless tobacco: Never Used  . Alcohol Use: No    Review of Systems  All other systems reviewed and are negative.     Allergies  Tramadol and Zoloft  Home Medications   Prior to Admission medications   Medication Sig Start Date End Date Taking? Authorizing Provider  ibuprofen (ADVIL,MOTRIN) 200 MG tablet Take 400-600 mg by mouth every 6 (six) hours as needed for mild pain or moderate pain.    Historical Provider, MD  predniSONE (DELTASONE) 10 MG tablet Take 2 tablets (20 mg total) by mouth daily. 03/19/14   Toy Baker, MD   BP 121/76  Pulse 90  Temp(Src) 98.2 F (36.8 C) (Oral)  Resp 18  Ht  (1.702 m)  Wt 135 lb (61.236 kg)  BMI 21.14 kg/m2  SpO2 99% Physical Exam  Nursing note and vitals reviewed.  28 year old male, resting comfortably and in no acute distress. Vital signs are normal. Oxygen saturation is 99%,  which is normal. Head is normocephalic and atraumatic. PERRLA, EOMI. examination of the oral cavity shows generally poor dentition with several teeth missing. Tooth #20 it is very tender and is very carious having lost most of the lateral aspect of the tooth. There is underlying gingival swelling and mild to moderate swelling of the jaw overlying this tooth. Neck is nontender and supple without adenopathy or JVD. Back is nontender and there is no CVA tenderness. Lungs are clear without rales, wheezes, or rhonchi. Chest is nontender. Heart has regular rate and rhythm without murmur. Abdomen is soft, flat, nontender without masses or hepatosplenomegaly and peristalsis is normoactive. Extremities have no cyanosis or edema, full range of motion is present. Skin is warm and dry without rash. Neurologic: Mental status is normal, cranial nerves are intact, there are no motor or sensory deficits.  ED Course  Procedures (including critical care time)  MDM   Final diagnoses:  Dental abscess    Dental abscess in the lower premolar. He is started on antibiotics of amoxicillin and is given a prescription for oxycodone and acetaminophen her for pain and is referred to on-call dentistry for definitive care. Old records are reviewed and I do not see any prior visits for dental complaints. His record, [place] controlled substance reporting website was reviewed and he has only one narcotic  prescription in the last 6 months.    Dione Booze, MD 03/28/14 [phone]

## 2014-03-28 NOTE — ED Notes (Signed)
Pt A&Ox4, ambulatory at d/c with steady gait, NAD 

## 2014-03-30 ENCOUNTER — Ambulatory Visit: Payer: Self-pay | Admitting: Internal Medicine

## 2014-04-03 ENCOUNTER — Encounter (HOSPITAL_COMMUNITY): Payer: Self-pay | Admitting: Emergency Medicine

## 2014-04-03 ENCOUNTER — Emergency Department (HOSPITAL_COMMUNITY)
Admission: EM | Admit: 2014-04-03 | Discharge: 2014-04-03 | Disposition: A | Discharge status: Home / Self Care | Payer: No Typology Code available for payment source | Attending: Emergency Medicine | Admitting: Emergency Medicine

## 2014-04-03 DIAGNOSIS — K089 Disorder of teeth and supporting structures, unspecified: Secondary | ICD-10-CM | POA: Insufficient documentation

## 2014-04-03 DIAGNOSIS — K0889 Other specified disorders of teeth and supporting structures: Secondary | ICD-10-CM

## 2014-04-03 DIAGNOSIS — Z8659 Personal history of other mental and behavioral disorders: Secondary | ICD-10-CM | POA: Insufficient documentation

## 2014-04-03 DIAGNOSIS — Z79899 Other long term (current) drug therapy: Secondary | ICD-10-CM | POA: Insufficient documentation

## 2014-04-03 DIAGNOSIS — K047 Periapical abscess without sinus: Secondary | ICD-10-CM | POA: Insufficient documentation

## 2014-04-03 DIAGNOSIS — Z792 Long term (current) use of antibiotics: Secondary | ICD-10-CM | POA: Insufficient documentation

## 2014-04-03 MED ORDER — OXYCODONE-ACETAMINOPHEN 5-325 MG PO TABS
2.0000 | ORAL_TABLET | Freq: Once | ORAL | Status: AC
  Administered 2014-04-03: 2 via ORAL
  Filled 2014-04-03: qty 2

## 2014-04-03 MED ORDER — OXYCODONE-ACETAMINOPHEN 5-325 MG PO TABS: 2.0000 | ORAL_TABLET | ORAL | Status: DC | PRN

## 2014-04-03 MED ORDER — CLINDAMYCIN HCL 300 MG PO CAPS
450.0000 mg | ORAL_CAPSULE | Freq: Once | ORAL | Status: AC
  Administered 2014-04-03: 450 mg via ORAL
  Filled 2014-04-03: qty 1

## 2014-04-03 MED ORDER — CLINDAMYCIN HCL 150 MG PO CAPS: 450.0000 mg | ORAL_CAPSULE | Freq: Three times a day (TID) | ORAL | Status: DC

## 2014-04-03 NOTE — Discharge Instructions (Signed)

## 2014-04-03 NOTE — ED Provider Notes (Signed)
Medical screening examination/treatment/procedure(s) were conducted as a shared visit with non-physician practitioner(s) and myself.  I personally evaluated the patient during the encounter.   EKG Interpretation None      Patient here with dental abscess. Had recent dental work by Dr. Leanord Asal - multiple left lower tooth extractions.  On exam, L outer gingival abscess with active drainage. Patient given clindamycin, f/u arranged with Dr. Leanord Asal this afternoon. No airway compromise.  Elwin Mocha, MD 04/03/14 [phone]

## 2014-04-03 NOTE — ED Notes (Signed)
Pt here for dental pain and jaw swelling on left side of face. Pt was seen her last week on Monday, seen dentist and had teeth pulled on Friday but no change in pain or swelling.

## 2014-04-03 NOTE — ED Provider Notes (Signed)
CSN: [id number]     Arrival date & time 04/03/14  0715 History   First MD Initiated Contact with Patient 04/03/14 [phone]     Chief Complaint  Patient presents with  . Dental Pain     (Consider location/radiation/quality/duration/timing/severity/associated sxs/prior Treatment) HPI Levi Palmer is a 28 y.o. male with no significant past medical history who comes in for reevaluation of dental pain and jaw swelling on the left side of his face. Patient was seen on Monday here for dental pain was given pain medicine, antibiotics and referral to dentistry. Patient was seen at Dr. Everardo All' office on Friday and teeth 17-20 extracted. Patient presents today with increased pain and to the surgical site. Nothing makes pain better and movement and heat worsens pain. Patient denies any fevers, difficulty breathing, difficulty swallowing, abdominal pain.  Past Medical History  Diagnosis Date  . Medical history non-contributory   . Anxiety   . Panic attack as reaction to stress    Past Surgical History  Procedure Laterality Date  . Cholecystectomy    . Tonsillectomy    . Incise and drain abcess     History reviewed. No pertinent family history. History  Substance Use Topics  . Smoking status: Never Smoker   . Smokeless tobacco: Never Used  . Alcohol Use: No    Review of Systems  Constitutional: Negative for fever.  HENT: Negative for sore throat.        Jaw pain to lower left jaw  Eyes: Negative for visual disturbance.  Respiratory: Negative for shortness of breath.   Cardiovascular: Negative for chest pain.  Gastrointestinal: Negative for abdominal pain.  Endocrine: Negative for polyuria.  Genitourinary: Negative for dysuria.  Skin: Negative for rash.  Neurological: Negative for headaches.      Allergies  Tramadol and Zoloft  Home Medications   Prior to Admission medications   Medication Sig Start Date End Date Taking? Authorizing Provider  amoxicillin (AMOXIL) 500  MG capsule Take 2 capsules (1,000 mg total) by mouth 2 (two) times daily. 03/28/14  Yes Dione Booze, MD  ibuprofen (ADVIL,MOTRIN) 200 MG tablet Take 400-600 mg by mouth every 6 (six) hours as needed for mild pain or moderate pain.   Yes Historical Provider, MD  oxyCODONE-acetaminophen (PERCOCET) 5-325 MG per tablet Take 1 tablet by mouth every 4 (four) hours as needed for moderate pain. 03/28/14  Yes Dione Booze, MD  clindamycin (CLEOCIN) 150 MG capsule Take 3 capsules (450 mg total) by mouth 3 (three) times daily. 04/03/14   Sharlene Motts, PA-C  oxyCODONE-acetaminophen (PERCOCET) 5-325 MG per tablet Take 2 tablets by mouth every 4 (four) hours as needed. 04/03/14   Earle Gell Durrell Barajas, PA-C   BP 130/92  Pulse 100  Temp(Src) 98 F (36.7 C) (Oral)  Ht  (1.702 m)  Wt 135 lb (61.236 kg)  BMI 21.14 kg/m2  SpO2 99% Physical Exam  Nursing note and vitals reviewed. Constitutional:  Awake, alert, nontoxic appearance.  HENT:  Head: Atraumatic.  Patient has actively draining abscess around his recent tooth extraction site on the lower left portion of his jaw. There is tenderness and swelling to the mid mandible. No evidence of glossal swelling. No evidence of glossal infection. No erythema or swelling to posterior oropharynx. Handling secretions well. Patent airway  Eyes: Right eye exhibits no discharge. Left eye exhibits no discharge.  Neck: Neck supple.  Cardiovascular: Normal rate, regular rhythm and normal heart sounds.   Pulmonary/Chest: Effort normal. He exhibits no  tenderness.  Abdominal: Soft. There is no tenderness. There is no rebound.  Musculoskeletal: He exhibits no tenderness.  Baseline ROM, no obvious new focal weakness.  Neurological:  Mental status and motor strength appears baseline for patient and situation.  Skin: No rash noted.  Psychiatric: He has a normal mood and affect.    ED Course  Procedures (including critical care time) Labs Review Labs Reviewed - No data  to display  Imaging Review No results found.   EKG Interpretation None     Meds given in ED:  Medications  clindamycin (CLEOCIN) capsule 450 mg (not administered)  oxyCODONE-acetaminophen (PERCOCET/ROXICET) 5-325 MG per tablet 2 tablet (2 tablets Oral Given 04/03/14 0857)    New Prescriptions   CLINDAMYCIN (CLEOCIN) 150 MG CAPSULE    Take 3 capsules (450 mg total) by mouth 3 (three) times daily.   OXYCODONE-ACETAMINOPHEN (PERCOCET) 5-325 MG PER TABLET    Take 2 tablets by mouth every 4 (four) hours as needed.   Filed Vitals:   04/03/14 0724 04/03/14 0725  BP: 130/92   Pulse: 100   Temp: 98 F (36.7 C)   TempSrc: Oral   Height:   (1.702 m)  Weight:  135 lb (61.236 kg)  SpO2: 99%     MDM  Vitals stable - WNL -afebrile. Nonspecific tachycardia likely due to pt discomfort Pt resting comfortably in ED. spoke with Joy at Dr. Leanord Asal' office. She says that he will see him this afternoon at 2pm for re-evaluation. Will place on clindamycin for infection prophylaxis. DC with Percocet for pain management PE reveals actively draining abscess over surgical tooth extraction site. Some pus expelled in room and suctioned out. No submandibular pain or evidence of subglossal infx- Ludwig angina less likely. No sign of respiratory compromise.   Discussed f/u with Dr. Leanord Asal at 2 PM and return precautions, pt very amenable to plan. Patient's stable, in good condition and is appropriate for discharge Prior to patient discharge, I discussed and reviewed this case with Dr.Walden      Final diagnoses:  Dental abscess  Pain, dental          Sharlene Motts, PA-C 04/03/14 1521

## 2014-04-06 ENCOUNTER — Ambulatory Visit: Payer: Self-pay | Admitting: Internal Medicine

## 2014-04-10 ENCOUNTER — Ambulatory Visit: Payer: Self-pay | Admitting: Gastroenterology

## 2014-04-17 ENCOUNTER — Encounter: Payer: Self-pay | Admitting: Gastroenterology

## 2014-04-17 ENCOUNTER — Ambulatory Visit: Payer: Self-pay | Admitting: Gastroenterology

## 2014-05-06 ENCOUNTER — Emergency Department (HOSPITAL_COMMUNITY): Payer: Self-pay

## 2014-05-06 ENCOUNTER — Emergency Department (HOSPITAL_COMMUNITY)
Admission: EM | Admit: 2014-05-06 | Discharge: 2014-05-06 | Disposition: A | Discharge status: Home / Self Care | Payer: Self-pay | Attending: Emergency Medicine | Admitting: Emergency Medicine

## 2014-05-06 ENCOUNTER — Encounter (HOSPITAL_COMMUNITY): Payer: Self-pay | Admitting: Emergency Medicine

## 2014-05-06 DIAGNOSIS — F1110 Opioid abuse, uncomplicated: Secondary | ICD-10-CM | POA: Insufficient documentation

## 2014-05-06 DIAGNOSIS — R109 Unspecified abdominal pain: Secondary | ICD-10-CM

## 2014-05-06 DIAGNOSIS — F1210 Cannabis abuse, uncomplicated: Secondary | ICD-10-CM | POA: Insufficient documentation

## 2014-05-06 DIAGNOSIS — Z792 Long term (current) use of antibiotics: Secondary | ICD-10-CM | POA: Insufficient documentation

## 2014-05-06 DIAGNOSIS — Z9049 Acquired absence of other specified parts of digestive tract: Secondary | ICD-10-CM | POA: Insufficient documentation

## 2014-05-06 DIAGNOSIS — R1011 Right upper quadrant pain: Secondary | ICD-10-CM | POA: Insufficient documentation

## 2014-05-06 DIAGNOSIS — R1031 Right lower quadrant pain: Secondary | ICD-10-CM | POA: Insufficient documentation

## 2014-05-06 DIAGNOSIS — Z8659 Personal history of other mental and behavioral disorders: Secondary | ICD-10-CM | POA: Insufficient documentation

## 2014-05-06 LAB — RAPID URINE DRUG SCREEN, HOSP PERFORMED
AMPHETAMINES: NOT DETECTED
Barbiturates: NOT DETECTED
Benzodiazepines: NOT DETECTED
Cocaine: NOT DETECTED
Opiates: POSITIVE — AB
Tetrahydrocannabinol: POSITIVE — AB

## 2014-05-06 LAB — BASIC METABOLIC PANEL
Anion gap: 13 (ref 5–15)
BUN: 36 mg/dL — AB (ref 6–23)
CO2: 21 mEq/L (ref 19–32)
Calcium: 7.6 mg/dL — ABNORMAL LOW (ref 8.4–10.5)
Chloride: 104 mEq/L (ref 96–112)
Creatinine, Ser: 0.85 mg/dL (ref 0.50–1.35)
Glucose, Bld: 96 mg/dL (ref 70–99)
POTASSIUM: 4.3 meq/L (ref 3.7–5.3)
Sodium: 138 mEq/L (ref 137–147)

## 2014-05-06 LAB — COMPREHENSIVE METABOLIC PANEL
ALT: 9 U/L (ref 0–53)
AST: 29 U/L (ref 0–37)
Albumin: 4.4 g/dL (ref 3.5–5.2)
Alkaline Phosphatase: 70 U/L (ref 39–117)
Anion gap: 21 — ABNORMAL HIGH (ref 5–15)
BUN: 55 mg/dL — ABNORMAL HIGH (ref 6–23)
CALCIUM: 9.2 mg/dL (ref 8.4–10.5)
CO2: 17 meq/L — AB (ref 19–32)
CREATININE: 1.09 mg/dL (ref 0.50–1.35)
Chloride: 93 mEq/L — ABNORMAL LOW (ref 96–112)
GFR calc non Af Amer: >90 mL/min (ref >90)
GLUCOSE: 121 mg/dL — AB (ref 70–99)
Potassium: 3.7 mEq/L (ref 3.7–5.3)
Sodium: 131 mEq/L — ABNORMAL LOW (ref 137–147)
Total Bilirubin: 0.7 mg/dL (ref 0.3–1.2)
Total Protein: 7.9 g/dL (ref 6.0–8.3)

## 2014-05-06 LAB — URINALYSIS, ROUTINE W REFLEX MICROSCOPIC
Bilirubin Urine: NEGATIVE
Glucose, UA: NEGATIVE mg/dL
KETONES UR: 40 mg/dL — AB
LEUKOCYTES UA: NEGATIVE
NITRITE: NEGATIVE
Protein, ur: 100 mg/dL — AB
Specific Gravity, Urine: 1.027 (ref 1.005–1.030)
Urobilinogen, UA: 0.2 mg/dL (ref 0.0–1.0)
pH: 6 (ref 5.0–8.0)

## 2014-05-06 LAB — CBC WITH DIFFERENTIAL/PLATELET
BASOS PCT: 0 % (ref 0–1)
Basophils Absolute: 0 10*3/uL (ref 0.0–0.1)
EOS ABS: 0 10*3/uL (ref 0.0–0.7)
EOS PCT: 0 % (ref 0–5)
HCT: 33.9 % — ABNORMAL LOW (ref 39.0–52.0)
HEMOGLOBIN: 11.6 g/dL — AB (ref 13.0–17.0)
Lymphocytes Relative: 11 % — ABNORMAL LOW (ref 12–46)
Lymphs Abs: 1.4 10*3/uL (ref 0.7–4.0)
MCH: 23.9 pg — ABNORMAL LOW (ref 26.0–34.0)
MCHC: 34.2 g/dL (ref 30.0–36.0)
MCV: 69.9 fL — AB (ref 78.0–100.0)
MONO ABS: 1 10*3/uL (ref 0.1–1.0)
Monocytes Relative: 8 % (ref 3–12)
Neutro Abs: 10.2 10*3/uL — ABNORMAL HIGH (ref 1.7–7.7)
Neutrophils Relative %: 81 % — ABNORMAL HIGH (ref 43–77)
Platelets: 323 10*3/uL (ref 150–400)
RBC: 4.85 MIL/uL (ref 4.22–5.81)
RDW: 14.6 % (ref 11.5–15.5)
WBC: 12.6 10*3/uL — ABNORMAL HIGH (ref 4.0–10.5)

## 2014-05-06 LAB — URINE MICROSCOPIC-ADD ON

## 2014-05-06 LAB — LIPASE, BLOOD: LIPASE: 10 U/L — AB (ref 11–59)

## 2014-05-06 MED ORDER — ONDANSETRON HCL 4 MG/2ML IJ SOLN
4.0000 mg | Freq: Once | INTRAMUSCULAR | Status: AC
  Administered 2014-05-06: 4 mg via INTRAVENOUS
  Filled 2014-05-06: qty 2

## 2014-05-06 MED ORDER — IOHEXOL 300 MG/ML  SOLN
100.0000 mL | Freq: Once | INTRAMUSCULAR | Status: AC | PRN
  Administered 2014-05-06: 100 mL via INTRAVENOUS

## 2014-05-06 MED ORDER — PROMETHAZINE HCL 25 MG PO TABS: 25.0000 mg | ORAL_TABLET | Freq: Four times a day (QID) | ORAL | Status: DC | PRN

## 2014-05-06 MED ORDER — SODIUM CHLORIDE 0.9 % IV BOLUS (SEPSIS)
1000.0000 mL | Freq: Once | INTRAVENOUS | Status: AC
  Administered 2014-05-06: 1000 mL via INTRAVENOUS

## 2014-05-06 MED ORDER — MORPHINE SULFATE 4 MG/ML IJ SOLN
4.0000 mg | Freq: Once | INTRAMUSCULAR | Status: AC
  Administered 2014-05-06: 4 mg via INTRAVENOUS
  Filled 2014-05-06: qty 1

## 2014-05-06 MED ORDER — HYDROCODONE-ACETAMINOPHEN 5-325 MG PO TABS: ORAL_TABLET | ORAL | Status: DC

## 2014-05-06 MED ORDER — IOHEXOL 300 MG/ML  SOLN
50.0000 mL | Freq: Once | INTRAMUSCULAR | Status: AC | PRN
  Administered 2014-05-06: 50 mL via ORAL

## 2014-05-06 MED ORDER — SODIUM CHLORIDE 0.9 % IV BOLUS (SEPSIS)
2000.0000 mL | Freq: Once | INTRAVENOUS | Status: AC
  Administered 2014-05-06: 2000 mL via INTRAVENOUS

## 2014-05-06 MED ORDER — FENTANYL CITRATE 0.05 MG/ML IJ SOLN
50.0000 ug | Freq: Once | INTRAMUSCULAR | Status: AC
  Administered 2014-05-06: 50 ug via INTRAVENOUS
  Filled 2014-05-06: qty 2

## 2014-05-06 NOTE — ED Provider Notes (Signed)
Medical screening examination/treatment/procedure(s) were performed by non-physician practitioner and as supervising physician I was immediately available for consultation/collaboration.   EKG Interpretation None        Lyanne CoKevin M Brylee Berk, MD 05/06/14 1336

## 2014-05-06 NOTE — ED Notes (Signed)
Pt presents from home by EMS with c/o RUQ pain x 4 days with n/v/d. IV est and Zofran 4mg  IVP given.

## 2014-05-06 NOTE — ED Notes (Signed)
Bed: WA14 Expected date:  Expected time:  Means of arrival:  Comments: EMS-abdominal pain 

## 2014-05-06 NOTE — Discharge Instructions (Signed)
Please follow with your primary care doctor in the next 2 days for a check-up. They must obtain records for further management.   Do not hesitate to return to the Emergency Department for any new, worsening or concerning symptoms.   Take vicodin for breakthrough pain, do not drink alcohol, drive, care for children or do other critical tasks while taking vicodin.   Chronic Pain Chronic pain can be defined as pain that is off and on and lasts for 3-6 months or longer. Many things cause chronic pain, which can make it difficult to make a diagnosis. There are many treatment options available for chronic pain. However, finding a treatment that works well for you may require trying various approaches until the right one is found. Many people benefit from a combination of two or more types of treatment to control their pain. SYMPTOMS  Chronic pain can occur anywhere in the body and can range from mild to very severe. Some types of chronic pain include:  Headache.  Low back pain.  Cancer pain.  Arthritis pain.  Neurogenic pain. This is pain resulting from damage to nerves. People with chronic pain may also have other symptoms such as:  Depression.  Anger.  Insomnia.  Anxiety. DIAGNOSIS  Your health care provider will help diagnose your condition over time. In many cases, the initial focus will be on excluding possible conditions that could be causing the pain. Depending on your symptoms, your health care provider may order tests to diagnose your condition. Some of these tests may include:   Blood tests.   CT scan.   MRI.   X-rays.   Ultrasounds.   Nerve conduction studies.  You may need to see a specialist.  TREATMENT  Finding treatment that works well may take time. You may be referred to a pain specialist. He or she may prescribe medicine or therapies, such as:   Mindful meditation or yoga.  Shots (injections) of numbing or pain-relieving medicines into the spine or  area of pain.  Local electrical stimulation.  Acupuncture.   Massage therapy.   Aroma, color, light, or sound therapy.   Biofeedback.   Working with a physical therapist to keep from getting stiff.   Regular, gentle exercise.   Cognitive or behavioral therapy.   Group support.  Sometimes, surgery may be recommended.  HOME CARE INSTRUCTIONS   Take all medicines as directed by your health care provider.   Lessen stress in your life by relaxing and doing things such as listening to calming music.   Exercise or be active as directed by your health care provider.   Eat a healthy diet and include things such as vegetables, fruits, fish, and lean meats in your diet.   Keep all follow-up appointments with your health care provider.   Attend a support group with others suffering from chronic pain. SEEK MEDICAL CARE IF:   Your pain gets worse.   You develop a new pain that was not there before.   You cannot tolerate medicines given to you by your health care provider.   You have new symptoms since your last visit with your health care provider.  SEEK IMMEDIATE MEDICAL CARE IF:   You feel weak.   You have decreased sensation or numbness.   You lose control of bowel or bladder function.   Your pain suddenly gets much worse.   You develop shaking.  You develop chills.  You develop confusion.  You develop chest pain.  You develop shortness of  breath.  MAKE SURE YOU:  Understand these instructions.  Will watch your condition.  Will get help right away if you are not doing well or get worse. Document Released: 03/15/2002 Document Revised: 02/23/2013 Document Reviewed: 12/17/2012 [facility] Patient Information 2015 [place], [place]. This information is not intended to replace advice given to you by your health care provider. Make sure you discuss any questions you have with your health care provider.

## 2014-05-06 NOTE — ED Provider Notes (Signed)
CSN: [id number]     Arrival date & time 05/06/14  [id number] History   None    Chief Complaint  Patient presents with  . Abdominal Pain     (Consider location/radiation/quality/duration/timing/severity/associated sxs/prior Treatment) HPI   Levi Palmer is a 28 y.o. male status post remote cholecystectomy, past medical history significant for panic attacks, chronic abdominal pain complaining of acute onset of right upper quadrant pain 3 days ago significantly worsening over the last 12 hours. Patient has had several episodes of nonbloody, nonbilious, non-coffee-ground appearing emesis associated with diarrhea. Patient denies fever, chills, cough, chest pain, shortness of breath. Patient states this is atypical for his abdominal pain. Denies any history of nephrolithiasis. Patient states he had an appointment with a gastroenterologist but he was unable to go to the appointment.  Past Medical History  Diagnosis Date  . Medical history non-contributory   . Anxiety   . Panic attack as reaction to stress    Past Surgical History  Procedure Laterality Date  . Cholecystectomy    . Tonsillectomy    . Incise and drain abcess    . Mouth surgery     No family history on file. History  Substance Use Topics  . Smoking status: Never Smoker   . Smokeless tobacco: Never Used  . Alcohol Use: No    Review of Systems  10 systems reviewed and found to be negative, except as noted in the HPI.   Allergies  Tramadol and Zoloft  Home Medications   Prior to Admission medications   Medication Sig Start Date End Date Taking? Authorizing Provider  calcium carbonate (TUMS EX) 750 MG chewable tablet Chew 1 tablet by mouth as needed for heartburn.   Yes Historical Provider, MD  ibuprofen (ADVIL,MOTRIN) 200 MG tablet Take 400-600 mg by mouth every 6 (six) hours as needed for mild pain or moderate pain.   Yes Historical Provider, MD  amoxicillin (AMOXIL) 500 MG capsule Take 2 capsules (1,000 mg  total) by mouth 2 (two) times daily. 03/28/14   Dione Boozeavid Glick, MD  HYDROcodone-acetaminophen (NORCO/VICODIN) 5-325 MG per tablet Take 1-2 tablets by mouth every 6 hours as needed for pain. 05/06/14   Marcin Holte, PA-C  oxyCODONE-acetaminophen (PERCOCET) 5-325 MG per tablet Take 2 tablets by mouth every 4 (four) hours as needed. 04/03/14   Earle GellBenjamin W Cartner, PA-C  promethazine (PHENERGAN) 25 MG tablet Take 1 tablet (25 mg total) by mouth every 6 (six) hours as needed for nausea or vomiting. 05/06/14   Derward Marple, PA-C   BP 118/70  Pulse 71  Temp(Src) 97.9 F (36.6 C) (Oral)  Resp 14  Ht 5\' 7"  (1.702 m)  Wt 135 lb (61.236 kg)  BMI 21.14 kg/m2  SpO2 100% Physical Exam  Nursing note and vitals reviewed. Constitutional: He is oriented to person, place, and time. He appears well-developed and well-nourished. No distress.  HENT:  Head: Normocephalic.  Mouth/Throat: Oropharynx is clear and moist.  Eyes: Conjunctivae and EOM are normal. Pupils are equal, round, and reactive to light.  Neck: Normal range of motion.  Cardiovascular: Normal rate.   Pulmonary/Chest: Effort normal and breath sounds normal. No stridor. No respiratory distress. He has no wheezes. He has no rales. He exhibits no tenderness.  Abdominal: Soft. He exhibits no mass. There is tenderness. There is no rebound and no guarding.  Hypo-active bowel sounds, tender to palpation in the right upper and right lower quadrant.  Genitourinary:  No CVA tenderness to palpation bilaterally  Musculoskeletal: Normal  range of motion. He exhibits no edema.  Neurological: He is alert and oriented to person, place, and time.  Psychiatric: He has a normal mood and affect.    ED Course  Procedures (including critical care time) Labs Review Labs Reviewed  CBC WITH DIFFERENTIAL - Abnormal; Notable for the following:    WBC 12.6 (*)    Hemoglobin 11.6 (*)    HCT 33.9 (*)    MCV 69.9 (*)    MCH 23.9 (*)    Neutrophils Relative % 81  (*)    Lymphocytes Relative 11 (*)    Neutro Abs 10.2 (*)    All other components within normal limits  COMPREHENSIVE METABOLIC PANEL - Abnormal; Notable for the following:    Sodium 131 (*)    Chloride 93 (*)    CO2 17 (*)    Glucose, Bld 121 (*)    BUN 55 (*)    Anion gap 21 (*)    All other components within normal limits  LIPASE, BLOOD - Abnormal; Notable for the following:    Lipase 10 (*)    All other components within normal limits  URINALYSIS, ROUTINE W REFLEX MICROSCOPIC - Abnormal; Notable for the following:    Hgb urine dipstick SMALL (*)    Ketones, ur 40 (*)    Protein, ur 100 (*)    All other components within normal limits  URINE RAPID DRUG SCREEN (HOSP PERFORMED) - Abnormal; Notable for the following:    Opiates POSITIVE (*)    Tetrahydrocannabinol POSITIVE (*)    All other components within normal limits  BASIC METABOLIC PANEL - Abnormal; Notable for the following:    BUN 36 (*)    Calcium 7.6 (*)    All other components within normal limits  URINE MICROSCOPIC-ADD ON    Imaging Review US Abdomen Complete  05/06/2014   CLINICAL DATA:  Right upper quadrant pain. Pain for 2 weeks. Prior cholecystectomy.  EXAM: ULTRASOUND ABDOMEN COMPLETE  COMPARISON:  CT 12/20/2013  FINDINGS: Gallbladder: Postcholecystectomy  Common bile duct: Diameter: Normal at 5 mm  Liver: No focal lesion identified. Within normal limits in parenchymal echogenicity.  IVC: No abnormality visualized.  Pancreas: Visualized portion unremarkable.  Spleen: Size and appearance within normal limits.  Right Kidney: Length: 10.4 cm. Echogenicity within normal limits. No mass or hydronephrosis visualized.  Left Kidney: Length: 11.2 cm. Echogenicity within normal limits. No mass or hydronephrosis visualized.  Abdominal aorta: No aneurysm visualized.  Other findings: No free fluid  IMPRESSION: 1. No acute abdominal findings by ultrasound. 2. Patient status post cholecystectomy.   Electronically Signed   By:  Genevive Bi M.D.   On: 05/06/2014 09:04   Ct Abdomen Pelvis W Contrast  05/06/2014   CLINICAL DATA:  Right upper quadrant abdominal pain, nausea, vomiting and diarrhea. Elevated white blood cell count.  EXAM: CT ABDOMEN AND PELVIS WITH CONTRAST  TECHNIQUE: Multidetector CT imaging of the abdomen and pelvis was performed using the standard protocol following bolus administration of intravenous contrast.  CONTRAST:  50mL OMNIPAQUE IOHEXOL 300 MG/ML SOLN, OMNIPAQUE IOHEXOL 300 MG/ML SOLN  COMPARISON:  Ultrasound earlier today and prior CT on 12/20/2013  FINDINGS: The liver shows a stable 0.6 cm cyst in the posterior right lobe. No solid masses in the liver. There is suggestion of mild periportal edema in the liver without evidence of biliary ductal dilatation. The common bile duct is of normal caliber post cholecystectomy. No abnormal fluid collection is seen in the gallbladder fossa.  The pancreas, spleen, adrenal glands and kidneys are within normal limits. Bowel shows no evidence of obstruction or inflammation. No free air, free fluid or focal abscess is identified.  The bladder is moderately distended and otherwise unremarkable. No masses or enlarged lymph nodes are seen. No evidence of hernia. No vascular abnormalities. Bony structures show a normal appearance to the visualized spine and bony pelvis. No abnormalities at the visualized lung bases.  IMPRESSION: Suggestion of nonspecific mild periportal edema in the liver without evidence of biliary obstruction. Correlation suggested with liver function tests. Stable tiny right lobe hepatic cyst.   Electronically Signed   By: Irish LackGlenn  Yamagata M.D.   On: 05/06/2014 11:52     EKG Interpretation None      MDM   Final diagnoses:  RLQ abdominal pain  RUQ abdominal pain  Abdominal pain    Filed Vitals:   05/06/14 1038 05/06/14 1132 05/06/14 1223 05/06/14 1312  BP: 126/70 114/63 118/70 118/70  Pulse: 75 73 76 71  Temp: 97.8 F (36.6 C)  98.1 F (36.7 C) 98 F (36.7 C) 97.9 F (36.6 C)  TempSrc: Axillary Oral Oral Oral  Resp: 14 16  14   Height:      Weight:      SpO2: 99% 100% 100% 100%    Medications  fentaNYL (SUBLIMAZE) injection 50 mcg (50 mcg Intravenous Given 05/06/14 0658)  morphine 4 MG/ML injection 4 mg (4 mg Intravenous Given 05/06/14 0703)  ondansetron (ZOFRAN) injection 4 mg (4 mg Intravenous Given 05/06/14 0703)  sodium chloride 0.9 % bolus 1,000 mL (0 mLs Intravenous Stopped 05/06/14 0800)  sodium chloride 0.9 % bolus 2,000 mL (0 mLs Intravenous Stopped 05/06/14 0953)  morphine 4 MG/ML injection 4 mg (4 mg Intravenous Given 05/06/14 0945)  iohexol (OMNIPAQUE) 300 MG/ML solution 50 mL (50 mLs Oral Contrast Given 05/06/14 0956)  ondansetron (ZOFRAN) injection 4 mg (4 mg Intravenous Given 05/06/14 1006)  sodium chloride 0.9 % bolus 2,000 mL (0 mLs Intravenous Stopped 05/06/14 1320)  iohexol (OMNIPAQUE) 300 MG/ML solution 100 mL (100 mLs Intravenous Contrast Given 05/06/14 1124)    Levi Palmer is a 28 y.o. male presenting with severe right upper and right lower quadrant pain with associated nausea vomiting and diarrhea. Bowel sounds are hypoactive, no peritoneal signs. Patient has chronic abdominal pain with cyclic vomiting syndrome. Will obtain ultrasound and basic blood work.  Abdominal ultrasound is noncontributory. Patient's blood work shows a anion gap of 21, mildly reduced bicarbonate, white blood cell count of 12.6. Patient warrants CT based on these abnormal findings. Serial abdominal exams remain nonsurgical.  CT does not show any acute abnormalities. Repeat chemistry shows a anion gap has normalized. Patient is tolerating by mouth and reports improvement in his abdominal pain. Patient will be discharged home with instructions to follow with gastroenterology.  Evaluation does not show pathology that would require ongoing emergent intervention or inpatient treatment. Pt is hemodynamically stable  and mentating appropriately. Discussed findings and plan with patient/guardian, who agrees with care plan. All questions answered. Return precautions discussed and outpatient follow up given.   New Prescriptions   HYDROCODONE-ACETAMINOPHEN (NORCO/VICODIN) 5-325 MG PER TABLET    Take 1-2 tablets by mouth every 6 hours as needed for pain.   PROMETHAZINE (PHENERGAN) 25 MG TABLET    Take 1 tablet (25 mg total) by mouth every 6 (six) hours as needed for nausea or vomiting.         Wynetta Emeryicole Adia Crammer, PA-C 05/06/14 1335

## 2014-05-11 ENCOUNTER — Ambulatory Visit: Payer: Self-pay | Attending: Internal Medicine | Admitting: Internal Medicine

## 2014-05-11 ENCOUNTER — Emergency Department (HOSPITAL_COMMUNITY)
Admission: EM | Admit: 2014-05-11 | Discharge: 2014-05-11 | Disposition: A | Discharge status: Home / Self Care | Payer: No Typology Code available for payment source | Attending: Emergency Medicine | Admitting: Emergency Medicine

## 2014-05-11 ENCOUNTER — Encounter: Payer: Self-pay | Admitting: Internal Medicine

## 2014-05-11 ENCOUNTER — Encounter (HOSPITAL_COMMUNITY): Payer: Self-pay | Admitting: Emergency Medicine

## 2014-05-11 VITALS — BP 164/92 | HR 85 | Temp 97.5°F | Resp 16

## 2014-05-11 DIAGNOSIS — Z9049 Acquired absence of other specified parts of digestive tract: Secondary | ICD-10-CM | POA: Insufficient documentation

## 2014-05-11 DIAGNOSIS — R1010 Upper abdominal pain, unspecified: Secondary | ICD-10-CM | POA: Insufficient documentation

## 2014-05-11 DIAGNOSIS — G8929 Other chronic pain: Secondary | ICD-10-CM | POA: Insufficient documentation

## 2014-05-11 DIAGNOSIS — Z792 Long term (current) use of antibiotics: Secondary | ICD-10-CM | POA: Insufficient documentation

## 2014-05-11 DIAGNOSIS — R1115 Cyclical vomiting syndrome unrelated to migraine: Secondary | ICD-10-CM

## 2014-05-11 DIAGNOSIS — Z79899 Other long term (current) drug therapy: Secondary | ICD-10-CM | POA: Insufficient documentation

## 2014-05-11 DIAGNOSIS — Z8659 Personal history of other mental and behavioral disorders: Secondary | ICD-10-CM | POA: Insufficient documentation

## 2014-05-11 DIAGNOSIS — G43A1 Cyclical vomiting, intractable: Secondary | ICD-10-CM | POA: Insufficient documentation

## 2014-05-11 DIAGNOSIS — R1011 Right upper quadrant pain: Secondary | ICD-10-CM | POA: Insufficient documentation

## 2014-05-11 DIAGNOSIS — R112 Nausea with vomiting, unspecified: Secondary | ICD-10-CM | POA: Insufficient documentation

## 2014-05-11 LAB — COMPREHENSIVE METABOLIC PANEL
ALBUMIN: 3.4 g/dL — AB (ref 3.5–5.2)
ALK PHOS: 51 U/L (ref 39–117)
ALT: 11 U/L (ref 0–53)
ANION GAP: 9 (ref 5–15)
AST: 18 U/L (ref 0–37)
BUN: 10 mg/dL (ref 6–23)
CO2: 26 mEq/L (ref 19–32)
CREATININE: 0.74 mg/dL (ref 0.50–1.35)
Calcium: 8.7 mg/dL (ref 8.4–10.5)
Chloride: 105 mEq/L (ref 96–112)
GFR calc Af Amer: >90 mL/min (ref >90)
GFR calc non Af Amer: >90 mL/min (ref >90)
Glucose, Bld: 90 mg/dL (ref 70–99)
POTASSIUM: 3.9 meq/L (ref 3.7–5.3)
Sodium: 140 mEq/L (ref 137–147)
Total Bilirubin: <0.2 mg/dL — ABNORMAL LOW (ref 0.3–1.2)
Total Protein: 6.5 g/dL (ref 6.0–8.3)

## 2014-05-11 LAB — URINALYSIS, ROUTINE W REFLEX MICROSCOPIC
BILIRUBIN URINE: NEGATIVE
Glucose, UA: NEGATIVE mg/dL
HGB URINE DIPSTICK: NEGATIVE
Ketones, ur: NEGATIVE mg/dL
Leukocytes, UA: NEGATIVE
Nitrite: NEGATIVE
Protein, ur: NEGATIVE mg/dL
Specific Gravity, Urine: 1.02 (ref 1.005–1.030)
Urobilinogen, UA: 0.2 mg/dL (ref 0.0–1.0)
pH: 7.5 (ref 5.0–8.0)

## 2014-05-11 LAB — CBC WITH DIFFERENTIAL/PLATELET
Basophils Absolute: 0 10*3/uL (ref 0.0–0.1)
Basophils Relative: 0 % (ref 0–1)
Eosinophils Absolute: 0.2 10*3/uL (ref 0.0–0.7)
Eosinophils Relative: 2 % (ref 0–5)
HCT: 33.1 % — ABNORMAL LOW (ref 39.0–52.0)
HEMOGLOBIN: 10.6 g/dL — AB (ref 13.0–17.0)
LYMPHS PCT: 16 % (ref 12–46)
Lymphs Abs: 1.4 10*3/uL (ref 0.7–4.0)
MCH: 23.9 pg — ABNORMAL LOW (ref 26.0–34.0)
MCHC: 32 g/dL (ref 30.0–36.0)
MCV: 74.5 fL — ABNORMAL LOW (ref 78.0–100.0)
MONOS PCT: 4 % (ref 3–12)
Monocytes Absolute: 0.4 10*3/uL (ref 0.1–1.0)
NEUTROS ABS: 7 10*3/uL (ref 1.7–7.7)
Neutrophils Relative %: 78 % — ABNORMAL HIGH (ref 43–77)
Platelets: 243 10*3/uL (ref 150–400)
RBC: 4.44 MIL/uL (ref 4.22–5.81)
RDW: 15.4 % (ref 11.5–15.5)
WBC: 8.9 10*3/uL (ref 4.0–10.5)

## 2014-05-11 LAB — LIPASE, BLOOD: LIPASE: 20 U/L (ref 11–59)

## 2014-05-11 MED ORDER — PANTOPRAZOLE SODIUM 40 MG IV SOLR
40.0000 mg | Freq: Once | INTRAVENOUS | Status: AC
  Administered 2014-05-11: 40 mg via INTRAVENOUS
  Filled 2014-05-11: qty 40

## 2014-05-11 MED ORDER — PROMETHAZINE HCL 25 MG PO TABS: 25.0000 mg | ORAL_TABLET | Freq: Four times a day (QID) | ORAL | Status: DC | PRN

## 2014-05-11 MED ORDER — KETOROLAC TROMETHAMINE 30 MG/ML IJ SOLN
30.0000 mg | Freq: Once | INTRAMUSCULAR | Status: AC
  Administered 2014-05-11: 30 mg via INTRAMUSCULAR

## 2014-05-11 MED ORDER — SODIUM CHLORIDE 0.9 % IV BOLUS (SEPSIS)
1000.0000 mL | Freq: Once | INTRAVENOUS | Status: AC
  Administered 2014-05-11: 1000 mL via INTRAVENOUS

## 2014-05-11 MED ORDER — HYDROMORPHONE HCL 1 MG/ML IJ SOLN
1.0000 mg | Freq: Once | INTRAMUSCULAR | Status: AC
  Administered 2014-05-11: 1 mg via INTRAVENOUS
  Filled 2014-05-11: qty 1

## 2014-05-11 MED ORDER — ONDANSETRON HCL 4 MG PO TABS: 4.0000 mg | ORAL_TABLET | Freq: Three times a day (TID) | ORAL | Status: DC | PRN

## 2014-05-11 MED ORDER — KETOROLAC TROMETHAMINE 30 MG/ML IM SOLN: 30.0000 mg | Freq: Once | INTRAMUSCULAR | Status: DC

## 2014-05-11 MED ORDER — ACETAMINOPHEN-CODEINE #3 300-30 MG PO TABS: 1.0000 | ORAL_TABLET | ORAL | Status: DC | PRN

## 2014-05-11 MED ORDER — PANTOPRAZOLE SODIUM 40 MG PO TBEC: 40.0000 mg | DELAYED_RELEASE_TABLET | Freq: Every day | ORAL | Status: DC

## 2014-05-11 MED ORDER — ONDANSETRON HCL 4 MG/2ML IJ SOLN
4.0000 mg | Freq: Once | INTRAMUSCULAR | Status: AC
  Administered 2014-05-11: 4 mg via INTRAVENOUS
  Filled 2014-05-11: qty 2

## 2014-05-11 MED ORDER — DIPHENHYDRAMINE HCL 50 MG PO CAPS
50.0000 mg | ORAL_CAPSULE | Freq: Once | ORAL | Status: AC
  Administered 2014-05-11: 50 mg via ORAL

## 2014-05-11 MED ORDER — HYDROMORPHONE HCL 1 MG/ML IJ SOLN
0.5000 mg | Freq: Once | INTRAMUSCULAR | Status: AC
  Administered 2014-05-11: 0.5 mg via INTRAVENOUS
  Filled 2014-05-11: qty 1

## 2014-05-11 NOTE — Progress Notes (Signed)
Patient ID: Levi GunningMichael N Palmer, male   DOB: 1985/10/13, 28 y.o.   MRN: [id number]   Levi JacobsonMichael Palmer, is a 28 y.o. male  [id number]  [id number]  DOB - 1985/10/13  Chief Complaint  Patient presents with  . Hospitalization Follow-up        Subjective:   Levi JacobsonMichael Palmer is a 28 y.o. male here today for a follow up visit. Patient with medical history significant for panic attacks and generalized anxiety with a remote cholecystectomy and chronic abdominal pain was recently seen in the ED for the same abdominal pain now worse 3 days prior to go to the ED. Patient had nonbloody nonbilious non-coffee ground appearing emesis associated with diarrhea. He is here today for follow-up ED visit. Patient claims he still has abdominal pain with nausea but no more vomiting. Denies history of kidney stones. Patient had been told he needs gastroenterology appointments but needs a referral from our clinic.  Problem  Abdominal Pain, Chronic, Right Upper Quadrant  Intractable Cyclical Vomiting With Nausea    ALLERGIES: Allergies  Allergen Reactions  . Tramadol Other (See Comments)    Jittery   . Zoloft [Sertraline Hcl] Palpitations    PAST MEDICAL HISTORY: Past Medical History  Diagnosis Date  . Medical history non-contributory   . Anxiety   . Panic attack as reaction to stress     MEDICATIONS AT HOME: Prior to Admission medications   Medication Sig Start Date End Date Taking? Authorizing Provider  acetaminophen (TYLENOL) 325 MG tablet Take 650 mg by mouth every 6 (six) hours as needed (pain).   Yes Historical Provider, MD  ibuprofen (ADVIL,MOTRIN) 200 MG tablet Take 400-600 mg by mouth every 6 (six) hours as needed for mild pain or moderate pain.   Yes Historical Provider, MD  acetaminophen-codeine (TYLENOL #3) 300-30 MG per tablet Take 1 tablet by mouth every 4 (four) hours as needed. 05/11/14   Quentin Angstlugbemiga E Dutchess Crosland, MD  amoxicillin (AMOXIL) 500 MG capsule Take 2 capsules (1,000 mg  total) by mouth 2 (two) times daily. 03/28/14   Dione Boozeavid Glick, MD  calcium carbonate (TUMS EX) 750 MG chewable tablet Chew 1 tablet by mouth as needed for heartburn.    Historical Provider, MD  HYDROcodone-acetaminophen (NORCO/VICODIN) 5-325 MG per tablet Take 1-2 tablets by mouth every 6 hours as needed for pain. 05/06/14   Nicole Pisciotta, PA-C  ondansetron (ZOFRAN) 4 MG tablet Take 1 tablet (4 mg total) by mouth every 8 (eight) hours as needed for nausea or vomiting. 05/11/14   Quentin Angstlugbemiga E Booker Bhatnagar, MD  oxyCODONE-acetaminophen (PERCOCET) 5-325 MG per tablet Take 2 tablets by mouth every 4 (four) hours as needed. 04/03/14   Earle GellBenjamin W Cartner, PA-C  pantoprazole (PROTONIX) 40 MG tablet Take 1 tablet (40 mg total) by mouth daily. 05/11/14   Quentin Angstlugbemiga E Chrisangel Eskenazi, MD  promethazine (PHENERGAN) 25 MG tablet Take 1 tablet (25 mg total) by mouth every 6 (six) hours as needed for nausea or vomiting. 05/11/14   Quentin Angstlugbemiga E Kelleen Stolze, MD     Objective:   Filed Vitals:   05/11/14 1602  BP: 164/92  Pulse: 85  Temp: 97.5 F (36.4 C)  TempSrc: Oral  Resp: 16  SpO2: 98%    Exam General appearance : Awake, alert, not in any distress. Speech Clear. Not toxic looking HEENT: Atraumatic and Normocephalic, pupils equally reactive to light and accomodation Neck: supple, no JVD. No cervical lymphadenopathy.  Chest:Good air entry bilaterally, no added sounds  CVS: S1 S2 regular, no murmurs.  Abdomen: Bowel sounds present, vague area of tenderness but not distended with no gaurding, rigidity or rebound. No organomegaly. Extremities: B/L Lower Ext shows no edema, both legs are warm to touch Neurology: Awake alert, and oriented X 3, CN II-XII intact, Non focal  Data Review No results found for: HGBA1C   Assessment & Plan   1. Abdominal pain, chronic, right upper quadrant  - pantoprazole (PROTONIX) 40 MG tablet; Take 1 tablet (40 mg total) by mouth daily.  Dispense: 30 tablet; Refill: 3 - acetaminophen-codeine  (TYLENOL #3) 300-30 MG per tablet; Take 1 tablet by mouth every 4 (four) hours as needed.  Dispense: 30 tablet; Refill: 0  - Ambulatory referral to Gastroenterology  2. Intractable cyclical vomiting with nausea  - promethazine (PHENERGAN) 25 MG tablet; Take 1 tablet (25 mg total) by mouth every 6 (six) hours as needed for nausea or vomiting.  Dispense: 30 tablet; Refill: 0 - ondansetron (ZOFRAN) 4 MG tablet; Take 1 tablet (4 mg total) by mouth every 8 (eight) hours as needed for nausea or vomiting.  Dispense: 20 tablet; Refill: 0   Return in about 6 months (around 11/09/2014), or if symptoms worsen or fail to improve, for Follow up Pain and comorbidities.  The patient was given clear instructions to go to ER or return to medical center if symptoms don't improve, worsen or new problems develop. The patient verbalized understanding. The patient was told to call to get lab results if they haven't heard anything in the next week.   This note has been created with Education officer, environmentalDragon speech recognition software and smart phrase technology. Any transcriptional errors are unintentional.    Jeanann LewandowskyJEGEDE, Genetta Fiero, MD, MHA, FACP, FAAP [facility] [employer], [place] [phone]   05/11/2014, 4:30 PM

## 2014-05-11 NOTE — Discharge Instructions (Signed)
It was our pleasure to provide your ER care today - we hope that you feel better.  Take protonix (acid blocker medication). You may also try pepcid and maalox as need for symptom relief. Follow up with primary care doctor in coming week. For recurrent gi symptoms, follow up with gi specialist in the next couple weeks - see referral - call to arrange appointment.  Return to ER if worse, new symptoms, fevers, persistent vomiting, other concern.  You were given pain medication in the ER - no driving for the next 6 hours.   Abdominal Pain Many things can cause abdominal pain. Usually, abdominal pain is not caused by a disease and will improve without treatment. It can often be observed and treated at home. Your health care provider will do a physical exam and possibly order blood tests and X-rays to help determine the seriousness of your pain. However, in many cases, more time must pass before a clear cause of the pain can be found. Before that point, your health care provider may not know if you need more testing or further treatment. HOME CARE INSTRUCTIONS  Monitor your abdominal pain for any changes. The following actions may help to alleviate any discomfort you are experiencing:  Only take over-the-counter or prescription medicines as directed by your health care provider.  Do not take laxatives unless directed to do so by your health care provider.  Try a clear liquid diet (broth, tea, or water) as directed by your health care provider. Slowly move to a bland diet as tolerated. SEEK MEDICAL CARE IF:  You have unexplained abdominal pain.  You have abdominal pain associated with nausea or diarrhea.  You have pain when you urinate or have a bowel movement.  You experience abdominal pain that wakes you in the night.  You have abdominal pain that is worsened or improved by eating food.  You have abdominal pain that is worsened with eating fatty foods.  You have a fever. SEEK IMMEDIATE  MEDICAL CARE IF:   Your pain does not go away within 2 hours.  You keep throwing up (vomiting).  Your pain is felt only in portions of the abdomen, such as the right side or the left lower portion of the abdomen.  You pass bloody or black tarry stools. MAKE SURE YOU:  Understand these instructions.   Will watch your condition.   Will get help right away if you are not doing well or get worse.  Document Released: 04/02/2005 Document Revised: 06/28/2013 Document Reviewed: 03/02/2013 [facility] Patient Information 2015 [place], [place]. This information is not intended to replace advice given to you by your health care provider. Make sure you discuss any questions you have with your health care provider.    Nausea and Vomiting Nausea is a sick feeling that often comes before throwing up (vomiting). Vomiting is a reflex where stomach contents come out of your mouth. Vomiting can cause severe loss of body fluids (dehydration). Children and elderly adults can become dehydrated quickly, especially if they also have diarrhea. Nausea and vomiting are symptoms of a condition or disease. It is important to find the cause of your symptoms. CAUSES   Direct irritation of the stomach lining. This irritation can result from increased acid production (gastroesophageal reflux disease), infection, food poisoning, taking certain medicines (such as nonsteroidal anti-inflammatory drugs), alcohol use, or tobacco use.  Signals from the brain.These signals could be caused by a headache, heat exposure, an inner ear disturbance, increased pressure in the brain  from injury, infection, a tumor, or a concussion, pain, emotional stimulus, or metabolic problems.  An obstruction in the gastrointestinal tract (bowel obstruction).  Illnesses such as diabetes, hepatitis, gallbladder problems, appendicitis, kidney problems, cancer, sepsis, atypical symptoms of a heart attack, or eating disorders.  Medical treatments  such as chemotherapy and radiation.  Receiving medicine that makes you sleep (general anesthetic) during surgery. DIAGNOSIS Your caregiver may ask for tests to be done if the problems do not improve after a few days. Tests may also be done if symptoms are severe or if the reason for the nausea and vomiting is not clear. Tests may include:  Urine tests.  Blood tests.  Stool tests.  Cultures (to look for evidence of infection).  X-rays or other imaging studies. Test results can help your caregiver make decisions about treatment or the need for additional tests. TREATMENT You need to stay well hydrated. Drink frequently but in small amounts.You may wish to drink water, sports drinks, clear broth, or eat frozen ice pops or gelatin dessert to help stay hydrated.When you eat, eating slowly may help prevent nausea.There are also some antinausea medicines that may help prevent nausea. HOME CARE INSTRUCTIONS   Take all medicine as directed by your caregiver.  If you do not have an appetite, do not force yourself to eat. However, you must continue to drink fluids.  If you have an appetite, eat a normal diet unless your caregiver tells you differently.  Eat a variety of complex carbohydrates (rice, wheat, potatoes, bread), lean meats, yogurt, fruits, and vegetables.  Avoid high-fat foods because they are more difficult to digest.  Drink enough water and fluids to keep your urine clear or pale yellow.  If you are dehydrated, ask your caregiver for specific rehydration instructions. Signs of dehydration may include:  Severe thirst.  Dry lips and mouth.  Dizziness.  Dark urine.  Decreasing urine frequency and amount.  Confusion.  Rapid breathing or pulse. SEEK IMMEDIATE MEDICAL CARE IF:   You have blood or brown flecks (like coffee grounds) in your vomit.  You have black or bloody stools.  You have a severe headache or stiff neck.  You are confused.  You have severe  abdominal pain.  You have chest pain or trouble breathing.  You do not urinate at least once every 8 hours.  You develop cold or clammy skin.  You continue to vomit for longer than 24 to 48 hours.  You have a fever. MAKE SURE YOU:   Understand these instructions.  Will watch your condition.  Will get help right away if you are not doing well or get worse. Document Released: 06/23/2005 Document Revised: 09/15/2011 Document Reviewed: 11/20/2010 [facility] Patient Information 2015 [place], [place]. This information is not intended to replace advice given to you by your health care provider. Make sure you discuss any questions you have with your health care provider.

## 2014-05-11 NOTE — ED Provider Notes (Signed)
CSN: [id number]     Arrival date & time 05/11/14  0914 History   First MD Initiated Contact with Patient 05/11/14 0935     Chief Complaint  Patient presents with  . Abdominal Pain     (Consider location/radiation/quality/duration/timing/severity/associated sxs/prior Treatment) Patient is a 28 y.o. male presenting with abdominal pain. The history is provided by the patient.  Abdominal Pain Associated symptoms: nausea and vomiting   Associated symptoms: no chest pain, no chills, no constipation, no cough, no diarrhea, no fever, no shortness of breath and no sore throat   pt c/o upper abd pain and nv for the past day. Dull. Moderate. Constant. No specific exacerbating or alleviating factors.  Notes hx same. Emesis clear, not bloody or bilious. Having normal bms, no constipation or diarrhea. States same symptoms in past intermittently x years, no definite cause. States recently seen in ED for same. Denies hx pud or pancreatitis. Remote hx cholecystectomy, pt states unclear whether he ever had gallstones. Denies dysuria. No hematuria. No back/flank pain. No fever or chills. No cough or sob. No cp.      Past Medical History  Diagnosis Date  . Medical history non-contributory   . Anxiety   . Panic attack as reaction to stress    Past Surgical History  Procedure Laterality Date  . Cholecystectomy    . Tonsillectomy    . Incise and drain abcess    . Mouth surgery     No family history on file. History  Substance Use Topics  . Smoking status: Never Smoker   . Smokeless tobacco: Never Used  . Alcohol Use: No    Review of Systems  Constitutional: Negative for fever and chills.  HENT: Negative for sore throat.   Eyes: Negative for redness.  Respiratory: Negative for cough and shortness of breath.   Cardiovascular: Negative for chest pain.  Gastrointestinal: Positive for nausea, vomiting and abdominal pain. Negative for diarrhea and constipation.  Endocrine: Negative for polyuria.   Genitourinary: Negative for flank pain.  Musculoskeletal: Negative for back pain and neck pain.  Skin: Negative for rash.  Neurological: Negative for headaches.  Hematological: Does not bruise/bleed easily.  Psychiatric/Behavioral: Negative for confusion.      Allergies  Tramadol and Zoloft  Home Medications   Prior to Admission medications   Medication Sig Start Date End Date Taking? Authorizing Provider  amoxicillin (AMOXIL) 500 MG capsule Take 2 capsules (1,000 mg total) by mouth 2 (two) times daily. 03/28/14   Dione Boozeavid Glick, MD  calcium carbonate (TUMS EX) 750 MG chewable tablet Chew 1 tablet by mouth as needed for heartburn.    Historical Provider, MD  HYDROcodone-acetaminophen (NORCO/VICODIN) 5-325 MG per tablet Take 1-2 tablets by mouth every 6 hours as needed for pain. 05/06/14   Nicole Pisciotta, PA-C  ibuprofen (ADVIL,MOTRIN) 200 MG tablet Take 400-600 mg by mouth every 6 (six) hours as needed for mild pain or moderate pain.    Historical Provider, MD  oxyCODONE-acetaminophen (PERCOCET) 5-325 MG per tablet Take 2 tablets by mouth every 4 (four) hours as needed. 04/03/14   Earle GellBenjamin W Cartner, PA-C  promethazine (PHENERGAN) 25 MG tablet Take 1 tablet (25 mg total) by mouth every 6 (six) hours as needed for nausea or vomiting. 05/06/14   Nicole Pisciotta, PA-C   BP 163/94 mmHg  Pulse 98  Temp(Src) 98.3 F (36.8 C) (Oral)  Resp 18  SpO2 100% Physical Exam  Constitutional: He is oriented to person, place, and time. He appears well-developed and  well-nourished. No distress.  HENT:  Head: Atraumatic.  Mouth/Throat: Oropharynx is clear and moist.  Eyes: Conjunctivae are normal. No scleral icterus.  Neck: Neck supple. No tracheal deviation present.  Cardiovascular: Normal rate, regular rhythm, normal heart sounds and intact distal pulses.   Pulmonary/Chest: Effort normal and breath sounds normal. No accessory muscle usage. No respiratory distress.  Abdominal: Soft. Bowel sounds  are normal. He exhibits no distension and no mass. There is no tenderness. There is no rebound and no guarding.  Genitourinary:  No cva tenderness  Musculoskeletal: Normal range of motion. He exhibits no edema or tenderness.  Neurological: He is alert and oriented to person, place, and time.  Skin: Skin is warm and dry. No rash noted. He is not diaphoretic.  Psychiatric: He has a normal mood and affect.  Nursing note and vitals reviewed.   ED Course  Procedures (including critical care time) Labs Review  Results for orders placed or performed during the hospital encounter of 05/11/14  Comprehensive metabolic panel  Result Value Ref Range   Sodium 140 137 - 147 mEq/L   Potassium 3.9 3.7 - 5.3 mEq/L   Chloride 105 96 - 112 mEq/L   CO2 26 19 - 32 mEq/L   Glucose, Bld 90 70 - 99 mg/dL   BUN 10 6 - 23 mg/dL   Creatinine, Ser 5.400.74 0.50 - 1.35 mg/dL   Calcium 8.7 8.4 - 98.110.5 mg/dL   Total Protein 6.5 6.0 - 8.3 g/dL   Albumin 3.4 (L) 3.5 - 5.2 g/dL   AST 18 0 - 37 U/L   ALT 11 0 - 53 U/L   Alkaline Phosphatase 51 39 - 117 U/L   Total Bilirubin <0.2 (L) 0.3 - 1.2 mg/dL   GFR calc non Af Amer >90 >90 mL/min   GFR calc Af Amer >90 >90 mL/min   Anion gap 9 5 - 15  CBC with Differential  Result Value Ref Range   WBC 8.9 4.0 - 10.5 K/uL   RBC 4.44 4.22 - 5.81 MIL/uL   Hemoglobin 10.6 (L) 13.0 - 17.0 g/dL   HCT 19.133.1 (L) 47.839.0 - 29.552.0 %   MCV 74.5 (L) 78.0 - 100.0 fL   MCH 23.9 (L) 26.0 - 34.0 pg   MCHC 32.0 30.0 - 36.0 g/dL   RDW 62.115.4 30.811.5 - 65.715.5 %   Platelets 243 150 - 400 K/uL   Neutrophils Relative % 78 (H) 43 - 77 %   Neutro Abs 7.0 1.7 - 7.7 K/uL   Lymphocytes Relative 16 12 - 46 %   Lymphs Abs 1.4 0.7 - 4.0 K/uL   Monocytes Relative 4 3 - 12 %   Monocytes Absolute 0.4 0.1 - 1.0 K/uL   Eosinophils Relative 2 0 - 5 %   Eosinophils Absolute 0.2 0.0 - 0.7 K/uL   Basophils Relative 0 0 - 1 %   Basophils Absolute 0.0 0.0 - 0.1 K/uL  Urinalysis, Routine w reflex microscopic   Result Value Ref Range   Color, Urine YELLOW YELLOW   APPearance CLEAR CLEAR   Specific Gravity, Urine 1.020 1.005 - 1.030   pH 7.5 5.0 - 8.0   Glucose, UA NEGATIVE NEGATIVE mg/dL   Hgb urine dipstick NEGATIVE NEGATIVE   Bilirubin Urine NEGATIVE NEGATIVE   Ketones, ur NEGATIVE NEGATIVE mg/dL   Protein, ur NEGATIVE NEGATIVE mg/dL   Urobilinogen, UA 0.2 0.0 - 1.0 mg/dL   Nitrite NEGATIVE NEGATIVE   Leukocytes, UA NEGATIVE NEGATIVE  Lipase, blood  Result  Value Ref Range   Lipase 20 11 - 59 U/L       MDM  Iv ns bolus. protonix iv. zofran iv. Dilaudid 1 mg iv.  Reviewed nursing notes and prior charts for additional history.   On review records pt w recent ct and u/s for same symptoms - neg for acute process.    Pt w hx chronic abd pain, cyclic vomiting syndrome.   abd soft nt.  Pt afeb.  Pt improved and appears stable for d/c.     Suzi Roots, MD 05/14/14 [phone]

## 2014-05-11 NOTE — Progress Notes (Addendum)
[facility] [place],   [place]. Patient [facility] Patient stated that he had [facility]. Spoke with patient about [facility] program. Patient was not interested in resources. Did provided him with information on [facility] and [facility].   Patient was not interested in resources. Patient became agitated and stated that if no one was going to help, he might at well just die. Nurse is aware of statement.

## 2014-05-11 NOTE — ED Notes (Signed)
Per pt, states RUQ pain for 5 days-was seen here on the 31 st for the same symptoms-has not followed up with GI

## 2014-05-11 NOTE — Progress Notes (Signed)
[facility] Pt is in extreme pain in his abdomen. Pt needs a referral to a GI doctor.

## 2014-05-11 NOTE — Patient Instructions (Signed)
Cyclic Vomiting Syndrome Cyclic vomiting syndrome is a benign condition in which patients experience bouts or cycles of severe nausea and vomiting that last for hours or even days. The bouts of nausea and vomiting alternate with longer periods of no symptoms and generally good health. Cyclic vomiting syndrome occurs mostly in children, but can affect adults. CAUSES  [facility] has no known cause. Each episode is typically similar to the previous ones. The episodes tend to:   Start at about the same time of day.  Last the same length of time.  Present the same symptoms at the same level of intensity. Cyclic vomiting syndrome can begin at any age in children and adults. Cyclic vomiting syndrome usually starts between the ages of 3 and 7 years. In adults, episodes tend to occur less often than they do in children, but they last longer. Furthermore, the events or situations that trigger episodes in adults cannot always be pinpointed as easily as they can in children. There are 4 phases of cyclic vomiting syndrome: 1. Prodrome. The prodrome phase signals that an episode of nausea and vomiting is about to begin. This phase can last from just a few minutes to several hours. This phase is often marked by belly (abdominal) pain. Sometimes taking medicine early in the prodrome phase can stop an episode in progress. However, sometimes there is no warning. A person may simply wake up in the middle of the night or early morning and begin vomiting. 2. Episode. The episode phase consists of:  Severe vomiting.  Nausea.  Gagging (retching). 3. Recovery. The recovery phase begins when the nausea and vomiting stop. Healthy color, appetite, and energy return. 4. Symptom-free interval. The symptom-free interval phase is the period between episodes when no symptoms are present. TRIGGERS Episodes can be triggered by an infection or event. Examples of triggers include:  Infections.  Colds, allergies, sinus problems, and  the flu.  Eating certain foods such as chocolate or cheese.  Foods with monosodium glutamate (MSG) or preservatives.  Fast foods.  Pre-packaged foods.  Foods with low nutritional value (junk foods).  Overeating.  Eating just before going to bed.  Hot weather.  Dehydration.  Not enough sleep or poor sleep quality.  Physical exhaustion.  Menstruation.  Motion sickness.  Emotional stress (school or home difficulties).  Excitement or stress. SYMPTOMS  The main symptoms of cyclic vomiting syndrome are:  Severe vomiting.  Nausea.  Gagging (retching). Episodes usually begin at night or the first thing in the morning. Episodes may include vomiting or retching up to 5 or 6 times an hour during the worst of the episode. Episodes usually last anywhere from 1 to 4 days. Episodes can last for up to 10 days. Other symptoms include:  Paleness.  Exhaustion.  Listlessness.  Abdominal pain.  Loose stools or diarrhea. Sometimes the nausea and vomiting are so severe that a person appears to be almost unconscious. Sensitivity to light, headache, fever, dizziness, may also accompany an episode. In addition, the vomiting may cause drooling and excessive thirst. Drinking water usually leads to more vomiting, though the water can dilute the acid in the vomit, making the episode a little less painful. Continuous vomiting can lead to dehydration, which means that the body has lost excessive water and salts. DIAGNOSIS  Cyclic vomiting syndrome is hard to diagnose because there are no clear tests to identify it. A caregiver must diagnose cyclic vomiting syndrome by looking at symptoms and medical history. A caregiver must exclude more common diseases  or disorders that can also cause nausea and vomiting. Also, diagnosis takes time because caregivers need to identify a pattern or cycle to the vomiting. TREATMENT  Cyclic vomiting syndrome cannot be cured. Treatment varies, but people with  cyclic vomiting syndrome should get plenty of rest and sleep and take medications that prevent, stop, or lessen the vomiting episodes and other symptoms. People whose episodes are frequent and long-lasting may be treated during the symptom-free intervals in an effort to prevent or ease future episodes. The symptom-free phase is a good time to eliminate anything known to trigger an episode. For example, if episodes are brought on by stress or excitement, this period is the time to find ways to reduce stress and stay calm. If sinus problems or allergies cause episodes, those conditions should be treated. The triggers listed above should be avoided or prevented. Because of the similarities between migraine and cyclic vomiting syndrome, caregivers treat some people with severe cyclic vomiting syndrome with drugs that are also used for migraine headaches. The drugs are designed to:  Prevent episodes.  Reduce their frequency.  Lessen their severity. HOME CARE INSTRUCTIONS Once a vomiting episode begins, treatment is supportive. It helps to stay in bed and sleep in a dark, quiet room. Severe nausea and vomiting may require hospitalization and intravenous (IV) fluids to prevent dehydration. Relaxing medications (sedatives) may help if the nausea continues. Sometimes, during the prodrome phase, it is possible to stop an episode from happening altogether. Only take over-the-counter or prescription medicines for pain, discomfort or fever as directed by your caregiver. Do not give aspirin to children. During the recovery phase, drinking water and replacing lost electrolytes (salts in the blood) are very important. Electrolytes are salts that the body needs to function well and stay healthy. Symptoms during the recovery phase can vary. Some people find that their appetites return to normal immediately, while others need to begin by drinking clear liquids and then move slowly to solid food. RELATED COMPLICATIONS The  severe vomiting that defines cyclic vomiting syndrome is a risk factor for several complications:  Dehydration--Vomiting causes the body to lose water quickly.  Electrolyte imbalance--Vomiting also causes the body to lose the important salts it needs to keep working properly.  Peptic esophagitis--The tube that connects the mouth to the stomach (esophagus) becomes injured from the stomach acid that comes up with the vomit.  Hematemesis--The esophagus becomes irritated and bleeds, so blood mixes with the vomit.  Mallory-Weiss tear--The lower end of the esophagus may tear open or the stomach may bruise from vomiting or retching.  Tooth decay--The acid in the vomit can hurt the teeth by corroding the tooth enamel. SEEK MEDICAL CARE IF: You have questions or problems. Document Released: 09/01/2001 Document Revised: 09/15/2011 Document Reviewed: 09/30/2010 [facility] Patient Information 2015 [place], [place]. This information is not intended to replace advice given to you by your health care provider. Make sure you discuss any questions you have with your health care provider. Abdominal Pain Many things can cause abdominal pain. Usually, abdominal pain is not caused by a disease and will improve without treatment. It can often be observed and treated at home. Your health care provider will do a physical exam and possibly order blood tests and X-rays to help determine the seriousness of your pain. However, in many cases, more time must pass before a clear cause of the pain can be found. Before that point, your health care provider may not know if you need more testing or further treatment.  HOME CARE INSTRUCTIONS  Monitor your abdominal pain for any changes. The following actions may help to alleviate any discomfort you are experiencing:  Only take over-the-counter or prescription medicines as directed by your health care provider.  Do not take laxatives unless directed to do so by your health care  provider.  Try a clear liquid diet (broth, tea, or water) as directed by your health care provider. Slowly move to a bland diet as tolerated. SEEK MEDICAL CARE IF: 5. You have unexplained abdominal pain. 6. You have abdominal pain associated with nausea or diarrhea. 7. You have pain when you urinate or have a bowel movement. 8. You experience abdominal pain that wakes you in the night. 9. You have abdominal pain that is worsened or improved by eating food. 10. You have abdominal pain that is worsened with eating fatty foods. 11. You have a fever. SEEK IMMEDIATE MEDICAL CARE IF:   Your pain does not go away within 2 hours.  You keep throwing up (vomiting).  Your pain is felt only in portions of the abdomen, such as the right side or the left lower portion of the abdomen.  You pass bloody or black tarry stools. MAKE SURE YOU:  Understand these instructions.   Will watch your condition.   Will get help right away if you are not doing well or get worse.  Document Released: 04/02/2005 Document Revised: 06/28/2013 Document Reviewed: 03/02/2013 [facility] Patient Information 2015 [place]. This information is not intended to replace advice given to you by your health care provider. Make sure you discuss any questions you have with your health care provider.

## 2014-05-11 NOTE — ED Notes (Signed)
Pt states normal bowel movement this morning with vomiting that followed; states nauseated at present from pain; pain is constant; feels like a squeezing pain in right upper quadrant; same pain with ED visit five days ago with normal CT scan and U/S; states has a bulging area when he strains in the right upper abd

## 2014-05-13 ENCOUNTER — Emergency Department (HOSPITAL_COMMUNITY)
Admission: EM | Admit: 2014-05-13 | Discharge: 2014-05-13 | Disposition: A | Discharge status: Home / Self Care | Payer: No Typology Code available for payment source | Attending: Emergency Medicine | Admitting: Emergency Medicine

## 2014-05-13 ENCOUNTER — Encounter (HOSPITAL_COMMUNITY): Payer: Self-pay

## 2014-05-13 DIAGNOSIS — K029 Dental caries, unspecified: Secondary | ICD-10-CM | POA: Insufficient documentation

## 2014-05-13 DIAGNOSIS — K0889 Other specified disorders of teeth and supporting structures: Secondary | ICD-10-CM

## 2014-05-13 DIAGNOSIS — K088 Other specified disorders of teeth and supporting structures: Secondary | ICD-10-CM | POA: Insufficient documentation

## 2014-05-13 DIAGNOSIS — R220 Localized swelling, mass and lump, head: Secondary | ICD-10-CM | POA: Insufficient documentation

## 2014-05-13 DIAGNOSIS — Z8659 Personal history of other mental and behavioral disorders: Secondary | ICD-10-CM | POA: Insufficient documentation

## 2014-05-13 DIAGNOSIS — Z792 Long term (current) use of antibiotics: Secondary | ICD-10-CM | POA: Insufficient documentation

## 2014-05-13 MED ORDER — PENICILLIN V POTASSIUM 500 MG PO TABS: 500.0000 mg | ORAL_TABLET | Freq: Four times a day (QID) | ORAL | Status: AC

## 2014-05-13 MED ORDER — PENICILLIN V POTASSIUM 250 MG PO TABS
500.0000 mg | ORAL_TABLET | Freq: Once | ORAL | Status: AC
  Administered 2014-05-13: 500 mg via ORAL
  Filled 2014-05-13: qty 2

## 2014-05-13 MED ORDER — HYDROCODONE-ACETAMINOPHEN 5-325 MG PO TABS
2.0000 | ORAL_TABLET | Freq: Once | ORAL | Status: AC
  Administered 2014-05-13: 2 via ORAL
  Filled 2014-05-13: qty 2

## 2014-05-13 MED ORDER — HYDROCODONE-ACETAMINOPHEN 5-325 MG PO TABS: 1.0000 | ORAL_TABLET | ORAL | Status: DC | PRN

## 2014-05-13 NOTE — ED Provider Notes (Signed)
CSN: [id number]     Arrival date & time 05/13/14  1022 History   First MD Initiated Contact with Patient 05/13/14 1208     Chief Complaint  Patient presents with  . Dental Pain   (Consider location/radiation/quality/duration/timing/severity/associated sxs/prior Treatment) HPI Levi Palmer is a 28 yo male presenting with report of tooth pain with facial swelling onset this am.  He states he has had bottom teeth pulled but has broken top molar that has been painful for a few days.  He woke up this am with swelling to his left cheek. He describes the pain as constant and aching and rates as  8/10.  He denies fevers, chills, nausea, vomiting, difficulty swallowing or opening his mouth.  Past Medical History  Diagnosis Date  . Medical history non-contributory   . Anxiety   . Panic attack as reaction to stress    Past Surgical History  Procedure Laterality Date  . Cholecystectomy    . Tonsillectomy    . Incise and drain abcess    . Mouth surgery     No family history on file. History  Substance Use Topics  . Smoking status: Never Smoker   . Smokeless tobacco: Never Used  . Alcohol Use: No    Review of Systems  Constitutional: Negative for fever and chills.  HENT: Positive for dental problem and facial swelling. Negative for drooling, trouble swallowing and voice change.   Respiratory: Negative for shortness of breath.   Gastrointestinal: Negative for nausea and vomiting.  Skin: Negative for rash.      Allergies  Tramadol and Zoloft  Home Medications   Prior to Admission medications   Medication Sig Start Date End Date Taking? Authorizing Provider  acetaminophen (TYLENOL) 325 MG tablet Take 650 mg by mouth every 6 (six) hours as needed (pain).    Historical Provider, MD  acetaminophen-codeine (TYLENOL #3) 300-30 MG per tablet Take 1 tablet by mouth every 4 (four) hours as needed. 05/11/14   Quentin Angstlugbemiga E Jegede, MD  amoxicillin (AMOXIL) 500 MG capsule Take 2 capsules  (1,000 mg total) by mouth 2 (two) times daily. 03/28/14   Dione Boozeavid Glick, MD  calcium carbonate (TUMS EX) 750 MG chewable tablet Chew 1 tablet by mouth as needed for heartburn.    Historical Provider, MD  HYDROcodone-acetaminophen (NORCO/VICODIN) 5-325 MG per tablet Take 1-2 tablets by mouth every 6 hours as needed for pain. 05/06/14   Nicole Pisciotta, PA-C  ibuprofen (ADVIL,MOTRIN) 200 MG tablet Take 400-600 mg by mouth every 6 (six) hours as needed for mild pain or moderate pain.    Historical Provider, MD  ketorolac (TORADOL) 30 MG/ML injection Inject 1 mL (30 mg total) into the muscle once. 05/11/14   Quentin Angstlugbemiga E Jegede, MD  ondansetron (ZOFRAN) 4 MG tablet Take 1 tablet (4 mg total) by mouth every 8 (eight) hours as needed for nausea or vomiting. 05/11/14   Quentin Angstlugbemiga E Jegede, MD  oxyCODONE-acetaminophen (PERCOCET) 5-325 MG per tablet Take 2 tablets by mouth every 4 (four) hours as needed. 04/03/14   Earle GellBenjamin W Cartner, PA-C  pantoprazole (PROTONIX) 40 MG tablet Take 1 tablet (40 mg total) by mouth daily. 05/11/14   Quentin Angstlugbemiga E Jegede, MD  promethazine (PHENERGAN) 25 MG tablet Take 1 tablet (25 mg total) by mouth every 6 (six) hours as needed for nausea or vomiting. 05/11/14   Quentin Angstlugbemiga E Jegede, MD   BP 127/72 mmHg  Pulse 93  Temp(Src) 97.6 F (36.4 C) (Oral)  Resp 14  Ht  5\' 7"  (1.702 m)  Wt 135 lb (61.236 kg)  BMI 21.14 kg/m2  SpO2 97% Physical Exam  Constitutional: He appears well-developed and well-nourished. No distress.  HENT:  Head: Normocephalic and atraumatic.    Mouth/Throat: Uvula is midline and oropharynx is clear and moist. No trismus in the jaw. Abnormal dentition. Dental caries present. No uvula swelling.    Eyes: Conjunctivae are normal. Right eye exhibits no discharge. Left eye exhibits no discharge. No scleral icterus.  Cardiovascular: Intact distal pulses.   Pulmonary/Chest: Effort normal.  Neurological: He is alert. Coordination normal.  Skin: He is not  diaphoretic.  Nursing note and vitals reviewed.   ED Course  Procedures (including critical care time) Labs Review Labs Reviewed - No data to display  Imaging Review No results found.   EKG Interpretation None      MDM   Final diagnoses:  Pain, dental   28 yo male with toothache and left sided facial swelling in to left cheek. Exam unconcerning for Ludwig's angina. Will treat with penicillin and pain medicine.  Discharge instructions include referral to dentist and strict instructions to follow-up.  Pt aware of plan and in agreement.  Return precautions provided.    Filed Vitals:   05/13/14 1031 05/13/14 1238  BP: 127/72 130/84  Pulse: 93 82  Temp: 97.6 F (36.4 C) 98.1 F (36.7 C)  TempSrc: Oral Oral  Resp: 14 16  Height: 5\' 7"  (1.702 m)   Weight: 135 lb (61.236 kg)   SpO2: 97% 98%   Meds given in ED:  Medications  HYDROcodone-acetaminophen (NORCO/VICODIN) 5-325 MG per tablet 2 tablet (2 tablets Oral Given 05/13/14 1236)  penicillin v potassium (VEETID) tablet 500 mg (500 mg Oral Given 05/13/14 1236)    Discharge Medication List as of 05/13/2014 12:37 PM    START taking these medications   Details  HYDROcodone-acetaminophen (NORCO/VICODIN) 5-325 MG per tablet Take 1-2 tablets by mouth every 4 (four) hours as needed for moderate pain or severe pain., Starting 05/13/2014, Until Discontinued, Print    penicillin v potassium (VEETID) 500 MG tablet Take 1 tablet (500 mg total) by mouth 4 (four) times daily., Starting 05/13/2014, Until Sat 05/20/14, Print         Harle BattiestElizabeth Angla Delahunt, NP 05/14/14 [id number]  Geoffery Lyonsouglas Delo, MD 05/15/14 [phone]

## 2014-05-13 NOTE — ED Notes (Signed)
Pt reports L upper dental pain.  Pt states he woke this morning with facial swelling.

## 2014-05-13 NOTE — ED Notes (Signed)
Declined W/C at D/C and was escorted to lobby by RN. 

## 2014-05-13 NOTE — Discharge Instructions (Signed)
Please follow the directions provided.  It is very important you follow-up with the dentist referral given to repair this tooth.  Take the antibiotic as prescribed use the pain medicine as needed.  Don't hesitate to return for new, worsening or concerning symptoms.    SEEK IMMEDIATE MEDICAL CARE IF:  You have a fever or persistent symptoms for more than 2-3 days.  You have a fever and your symptoms suddenly get worse.  You have chills or a very bad headache.  You have problems breathing or swallowing.  You have trouble opening your mouth.  You have swelling in the neck or around the eye

## 2014-05-15 ENCOUNTER — Encounter: Payer: Self-pay | Admitting: Gastroenterology

## 2014-06-08 ENCOUNTER — Encounter: Payer: Self-pay | Admitting: Internal Medicine

## 2014-06-08 ENCOUNTER — Ambulatory Visit: Payer: No Typology Code available for payment source | Attending: Internal Medicine | Admitting: Internal Medicine

## 2014-06-08 VITALS — BP 122/66 | HR 85 | Temp 98.0°F | Resp 18 | Ht 67.0 in | Wt 142.0 lb

## 2014-06-08 DIAGNOSIS — R1084 Generalized abdominal pain: Secondary | ICD-10-CM

## 2014-06-08 DIAGNOSIS — Z2821 Immunization not carried out because of patient refusal: Secondary | ICD-10-CM

## 2014-06-08 MED ORDER — PROMETHAZINE HCL 25 MG PO TABS: 25.0000 mg | ORAL_TABLET | Freq: Four times a day (QID) | ORAL | Status: DC | PRN

## 2014-06-08 MED ORDER — ACETAMINOPHEN-CODEINE #3 300-30 MG PO TABS: 1.0000 | ORAL_TABLET | Freq: Two times a day (BID) | ORAL | Status: DC | PRN

## 2014-06-08 NOTE — Progress Notes (Signed)
Patient ID: Levi GunningMichael N Palmer, male   DOB: 14-Mar-1986, 28 y.o.   MRN: [id number]  CC: ABDOMINAL PAIN  HPI:  Pain been present for 6 years ago, constant, dull, cramping pain.  Gets nauseous with bowel movement Medications tried: narcotics from ER and here, phenergan Similar pain before: yes  Prior abdominal surgeries: gallbladder removed  Symptoms Nausea/vomiting: yes Diarrhea: n/a Constipation: yes. Has BM daily but with pain Blood in stool: none Blood in vomit: none Fever: none Dysuria: none Loss of appetite: afraid to eat so he does not have to have a BM Weight loss: yes, was up to 160 lbs now he is 140    Review of Symptoms - see HPI PMH - Smoking status noted.      Allergies  Allergen Reactions  . Tramadol Other (See Comments)    Jittery   . Zoloft [Sertraline Hcl] Palpitations   Past Medical History  Diagnosis Date  . Medical history non-contributory   . Anxiety   . Panic attack as reaction to stress    Current Outpatient Prescriptions on File Prior to Visit  Medication Sig Dispense Refill  . acetaminophen-codeine (TYLENOL #3) 300-30 MG per tablet Take 1 tablet by mouth every 4 (four) hours as needed. (Patient taking differently: Take 1 tablet by mouth every 4 (four) hours as needed (pain). ) 30 tablet 0  . ibuprofen (ADVIL,MOTRIN) 200 MG tablet Take 400-600 mg by mouth every 6 (six) hours as needed for mild pain or moderate pain.    . calcium carbonate (TUMS EX) 750 MG chewable tablet Chew 1 tablet by mouth as needed for heartburn.    Marland Kitchen. HYDROcodone-acetaminophen (NORCO/VICODIN) 5-325 MG per tablet Take 1-2 tablets by mouth every 4 (four) hours as needed for moderate pain or severe pain. (Patient not taking: Reported on 06/08/2014) 10 tablet 0  . ketorolac (TORADOL) 30 MG/ML injection Inject 1 mL (30 mg total) into the muscle once. (Patient not taking: Reported on 06/08/2014) 1 mL 0  . ondansetron (ZOFRAN) 4 MG tablet Take 1 tablet (4 mg total) by mouth every 8  (eight) hours as needed for nausea or vomiting. (Patient not taking: Reported on 06/08/2014) 20 tablet 0  . pantoprazole (PROTONIX) 40 MG tablet Take 1 tablet (40 mg total) by mouth daily. (Patient not taking: Reported on 06/08/2014) 30 tablet 3  . promethazine (PHENERGAN) 25 MG tablet Take 1 tablet (25 mg total) by mouth every 6 (six) hours as needed for nausea or vomiting. (Patient not taking: Reported on 06/08/2014) 30 tablet 0   No current facility-administered medications on file prior to visit.   History reviewed. No pertinent family history. History   Social History  . Marital Status: Single    Spouse Name: N/A    Number of Children: N/A  . Years of Education: N/A   Occupational History  . Not on file.   Social History Main Topics  . Smoking status: Never Smoker   . Smokeless tobacco: Never Used  . Alcohol Use: No  . Drug Use: Yes    Special: Marijuana     Comment: occasionally  . Sexual Activity: Not on file   Other Topics Concern  . Not on file   Social History Narrative      Objective:   Filed Vitals:   06/08/14 1643  BP: 122/66  Pulse: 85  Temp: 98 F (36.7 C)  Resp: 18    Physical Exam  Constitutional: He is oriented to person, place, and time.  Cardiovascular: Normal rate,  regular rhythm and normal heart sounds.   Pulmonary/Chest: Effort normal and breath sounds normal.  Abdominal: Soft. He exhibits no mass. There is tenderness (epigastric). There is no rebound and no guarding. No hernia.  Hypoactive bowel sounds  Neurological: He is alert and oriented to person, place, and time.  Skin: Skin is warm and dry.     Lab Results  Component Value Date   WBC 8.9 05/11/2014   HGB 10.6* 05/11/2014   HCT 33.1* 05/11/2014   MCV 74.5* 05/11/2014   PLT 243 05/11/2014   Lab Results  Component Value Date   CREATININE 0.74 05/11/2014   BUN 10 05/11/2014   NA 140 05/11/2014   K 3.9 05/11/2014   CL 105 05/11/2014   CO2 26 05/11/2014    No results found  for: HGBA1C Lipid Panel     Component Value Date/Time   CHOL 142 08/19/2013 1152   TRIG 68 08/19/2013 1152   HDL 47 08/19/2013 1152   CHOLHDL 3.0 08/19/2013 1152   VLDL 14 08/19/2013 1152   LDLCALC 81 08/19/2013 1152       Assessment and plan:   Levi NeedleMichael was seen today for follow-up.  Diagnoses and associated orders for this visit:  Generalized abdominal pain - H. pylori antibody, IgG - acetaminophen-codeine (TYLENOL #3) 300-30 MG per tablet; Take 1 tablet by mouth 2 (two) times daily as needed. - promethazine (PHENERGAN) 25 MG tablet; Take 1 tablet (25 mg total) by mouth every 6 (six) hours as needed for nausea or vomiting. Has appointment next month with GI. WIll only give enough medication to hold until he is able to get to GI appt  Refused influenza vaccine Explained that annual influenza is recommended per CDC guidelines and is highly suggested to anyone who has has CHF, COPD, DM or immunocompromised. Benefits of influenza described in detail.   Return if symptoms worsen or fail to improve.       Holland CommonsKECK, Romell Cavanah, NP-C [facility] [phone] 06/08/2014, 5:05 PM

## 2014-06-08 NOTE — Progress Notes (Signed)
F/U Abdominal pain Stated has apt with GI January 8 Stomach cramping pain worsen in the morning  Daily vomiting,

## 2014-06-09 LAB — H. PYLORI ANTIBODY, IGG: H PYLORI IGG: 2.6 {ISR} — AB

## 2014-06-11 ENCOUNTER — Other Ambulatory Visit: Payer: Self-pay | Admitting: Internal Medicine

## 2014-06-11 DIAGNOSIS — A048 Other specified bacterial intestinal infections: Secondary | ICD-10-CM

## 2014-06-11 MED ORDER — OMEPRAZOLE 20 MG PO CPDR: 20.0000 mg | DELAYED_RELEASE_CAPSULE | Freq: Two times a day (BID) | ORAL | Status: DC

## 2014-06-11 MED ORDER — CLARITHROMYCIN 500 MG PO TABS: 500.0000 mg | ORAL_TABLET | Freq: Two times a day (BID) | ORAL | Status: DC

## 2014-06-11 MED ORDER — AMOXICILLIN 500 MG PO TABS: 1000.0000 mg | ORAL_TABLET | Freq: Two times a day (BID) | ORAL | Status: DC

## 2014-06-12 ENCOUNTER — Telehealth: Payer: Self-pay | Admitting: *Deleted

## 2014-06-12 NOTE — Telephone Encounter (Signed)
Unable to leave VM on phone. 

## 2014-06-13 ENCOUNTER — Telehealth: Payer: Self-pay | Admitting: Emergency Medicine

## 2014-06-13 NOTE — Telephone Encounter (Signed)
Attempted to reach pt with results;number listed unable to accept incoming calls Medications sent per provider

## 2014-06-13 NOTE — Telephone Encounter (Signed)
-----   Message from Ambrose FinlandValerie A Keck, NP sent at 06/11/2014 11:17 PM EST ----- Please call patient and let him know that he did test positive for H. Pylori---what we discussed at the last office visit. Explain to him that he will need to begin a series of treatment for 2 weeks in order to cure.  Let him know that this is the cause of his stomach pain.  Stress to him that he will need to complete the full treatment and I do believe he will begin to feel better.  I have sent him Clarithromycin to take BID, omeprazole BID, and amoxicillin BID. Thanks

## 2014-06-23 ENCOUNTER — Ambulatory Visit: Payer: Self-pay | Attending: Internal Medicine

## 2014-07-12 ENCOUNTER — Emergency Department (HOSPITAL_COMMUNITY)
Admission: EM | Admit: 2014-07-12 | Discharge: 2014-07-12 | Disposition: A | Discharge status: Home / Self Care | Payer: No Typology Code available for payment source | Attending: Emergency Medicine | Admitting: Emergency Medicine

## 2014-07-12 ENCOUNTER — Encounter (HOSPITAL_COMMUNITY): Payer: Self-pay | Admitting: Emergency Medicine

## 2014-07-12 DIAGNOSIS — M549 Dorsalgia, unspecified: Secondary | ICD-10-CM | POA: Insufficient documentation

## 2014-07-12 DIAGNOSIS — R1010 Upper abdominal pain, unspecified: Secondary | ICD-10-CM | POA: Insufficient documentation

## 2014-07-12 DIAGNOSIS — G8929 Other chronic pain: Secondary | ICD-10-CM

## 2014-07-12 DIAGNOSIS — Z9049 Acquired absence of other specified parts of digestive tract: Secondary | ICD-10-CM | POA: Insufficient documentation

## 2014-07-12 DIAGNOSIS — M542 Cervicalgia: Secondary | ICD-10-CM | POA: Insufficient documentation

## 2014-07-12 DIAGNOSIS — R109 Unspecified abdominal pain: Secondary | ICD-10-CM

## 2014-07-12 DIAGNOSIS — Z8659 Personal history of other mental and behavioral disorders: Secondary | ICD-10-CM | POA: Insufficient documentation

## 2014-07-12 LAB — COMPREHENSIVE METABOLIC PANEL
ALBUMIN: 5 g/dL (ref 3.5–5.2)
ALT: 11 U/L (ref 0–53)
AST: 18 U/L (ref 0–37)
Alkaline Phosphatase: 76 U/L (ref 39–117)
Anion gap: 12 (ref 5–15)
BILIRUBIN TOTAL: 0.5 mg/dL (ref 0.3–1.2)
BUN: 14 mg/dL (ref 6–23)
CHLORIDE: 105 meq/L (ref 96–112)
CO2: 23 mmol/L (ref 19–32)
Calcium: 9.8 mg/dL (ref 8.4–10.5)
Creatinine, Ser: 0.83 mg/dL (ref 0.50–1.35)
GFR calc Af Amer: >90 mL/min (ref >90)
GLUCOSE: 93 mg/dL (ref 70–99)
POTASSIUM: 3.7 mmol/L (ref 3.5–5.1)
SODIUM: 140 mmol/L (ref 135–145)
Total Protein: 8.5 g/dL — ABNORMAL HIGH (ref 6.0–8.3)

## 2014-07-12 LAB — CBC WITH DIFFERENTIAL/PLATELET
BASOS PCT: 0 % (ref 0–1)
Basophils Absolute: 0 10*3/uL (ref 0.0–0.1)
Eosinophils Absolute: 0.1 10*3/uL (ref 0.0–0.7)
Eosinophils Relative: 1 % (ref 0–5)
HCT: 38.4 % — ABNORMAL LOW (ref 39.0–52.0)
Hemoglobin: 11.7 g/dL — ABNORMAL LOW (ref 13.0–17.0)
LYMPHS PCT: 23 % (ref 12–46)
Lymphs Abs: 1.8 10*3/uL (ref 0.7–4.0)
MCH: 22.8 pg — ABNORMAL LOW (ref 26.0–34.0)
MCHC: 30.5 g/dL (ref 30.0–36.0)
MCV: 74.7 fL — ABNORMAL LOW (ref 78.0–100.0)
MONO ABS: 0.4 10*3/uL (ref 0.1–1.0)
MONOS PCT: 5 % (ref 3–12)
NEUTROS ABS: 5.7 10*3/uL (ref 1.7–7.7)
NEUTROS PCT: 71 % (ref 43–77)
PLATELETS: 344 10*3/uL (ref 150–400)
RBC: 5.14 MIL/uL (ref 4.22–5.81)
RDW: 16.4 % — ABNORMAL HIGH (ref 11.5–15.5)
WBC: 8 10*3/uL (ref 4.0–10.5)

## 2014-07-12 LAB — URINALYSIS, ROUTINE W REFLEX MICROSCOPIC
GLUCOSE, UA: NEGATIVE mg/dL
HGB URINE DIPSTICK: NEGATIVE
KETONES UR: 15 mg/dL — AB
LEUKOCYTES UA: NEGATIVE
NITRITE: NEGATIVE
PH: 7 (ref 5.0–8.0)
Protein, ur: NEGATIVE mg/dL
SPECIFIC GRAVITY, URINE: 1.026 (ref 1.005–1.030)
Urobilinogen, UA: 1 mg/dL (ref 0.0–1.0)

## 2014-07-12 LAB — LIPASE, BLOOD: LIPASE: 20 U/L (ref 11–59)

## 2014-07-12 MED ORDER — PANTOPRAZOLE SODIUM 40 MG PO TBEC: 40.0000 mg | DELAYED_RELEASE_TABLET | Freq: Every day | ORAL | Status: DC

## 2014-07-12 MED ORDER — GI COCKTAIL ~~LOC~~
30.0000 mL | Freq: Once | ORAL | Status: AC
  Administered 2014-07-12: 30 mL via ORAL
  Filled 2014-07-12: qty 30

## 2014-07-12 MED ORDER — FAMOTIDINE 20 MG PO TABS
20.0000 mg | ORAL_TABLET | Freq: Once | ORAL | Status: AC
  Administered 2014-07-12: 20 mg via ORAL
  Filled 2014-07-12: qty 1

## 2014-07-12 MED ORDER — HYDROCODONE-ACETAMINOPHEN 5-325 MG PO TABS: 1.0000 | ORAL_TABLET | Freq: Four times a day (QID) | ORAL | Status: DC | PRN

## 2014-07-12 MED ORDER — ONDANSETRON 8 MG PO TBDP
8.0000 mg | ORAL_TABLET | Freq: Once | ORAL | Status: AC
  Administered 2014-07-12: 8 mg via ORAL
  Filled 2014-07-12: qty 1

## 2014-07-12 MED ORDER — HYDROMORPHONE HCL 1 MG/ML IJ SOLN
1.0000 mg | Freq: Once | INTRAMUSCULAR | Status: AC
  Administered 2014-07-12: 1 mg via INTRAMUSCULAR
  Filled 2014-07-12: qty 1

## 2014-07-12 NOTE — ED Provider Notes (Signed)
CSN: [id number]     Arrival date & time 07/12/14  1443 History   First MD Initiated Contact with Patient 07/12/14 1631     Chief Complaint  Patient presents with  . Abdominal Pain  . Back Pain  . Neck Pain     (Consider location/radiation/quality/duration/timing/severity/associated sxs/prior Treatment) Patient is a 29 y.o. male presenting with abdominal pain, back pain, and neck pain. The history is provided by the patient.  Abdominal Pain Associated symptoms: no chest pain, no chills, no cough, no diarrhea, no dysuria, no fever, no hematuria, no shortness of breath, no sore throat and no vomiting   Back Pain Associated symptoms: abdominal pain   Associated symptoms: no chest pain, no dysuria, no fever and no headaches   Neck Pain Associated symptoms: no chest pain, no fever and no headaches   pt c/o upper abdominal pain - states has chronic upper abd pain, same as this, for the past several years, but worse today. Pain constant, dull, moderate-severe, non radiating, without specific exacerbating or alleviating factors. Not related to eating or eating certain foods. States intermittent nausea and vomiting, none today, although is nauseated. No abd distension. Had normal bm today. Denies hx pud or pancreatitis. +hx cholecystectomy, but states that did not help or improve symptoms. Also states prior egd negative. Pt has arranged gi follow up in 2 days for same. No fever or chills. Hx weight loss although not recently. No cough or uri c/o. No cp or sob. No pleuritic pain. No back or flank pain. No gu c/o.  Pt also notes chronic neck and back pain, constant, no recent change or worsening, no injury, no numbness/weakness.      Past Medical History  Diagnosis Date  . Medical history non-contributory   . Anxiety   . Panic attack as reaction to stress    Past Surgical History  Procedure Laterality Date  . Cholecystectomy    . Tonsillectomy    . Incise and drain abcess    . Mouth surgery      No family history on file. History  Substance Use Topics  . Smoking status: Never Smoker   . Smokeless tobacco: Never Used  . Alcohol Use: No    Review of Systems  Constitutional: Negative for fever and chills.  HENT: Negative for sore throat.   Eyes: Negative for redness.  Respiratory: Negative for cough and shortness of breath.   Cardiovascular: Negative for chest pain.  Gastrointestinal: Positive for abdominal pain. Negative for vomiting and diarrhea.  Endocrine: Negative for polyuria.  Genitourinary: Negative for dysuria, hematuria and flank pain.  Musculoskeletal: Negative for myalgias.  Skin: Negative for rash.  Neurological: Negative for headaches.  Hematological: Does not bruise/bleed easily.  Psychiatric/Behavioral: Negative for confusion.      Allergies  Tramadol and Zoloft  Home Medications   Prior to Admission medications   Medication Sig Start Date End Date Taking? Authorizing Provider  ibuprofen (ADVIL,MOTRIN) 200 MG tablet Take 400-600 mg by mouth every 6 (six) hours as needed for mild pain or moderate pain.   Yes Historical Provider, MD  acetaminophen-codeine (TYLENOL #3) 300-30 MG per tablet Take 1 tablet by mouth 2 (two) times daily as needed. Patient not taking: Reported on 07/12/2014 06/08/14   Ambrose Finland, NP  amoxicillin (AMOXIL) 500 MG tablet Take 2 tablets (1,000 mg total) by mouth 2 (two) times daily. Patient not taking: Reported on 07/12/2014 06/11/14   Ambrose Finland, NP  clarithromycin (BIAXIN) 500 MG tablet Take  1 tablet (500 mg total) by mouth 2 (two) times daily. Patient not taking: Reported on 07/12/2014 06/11/14   Ambrose Finland, NP  ketorolac (TORADOL) 30 MG/ML injection Inject 1 mL (30 mg total) into the muscle once. Patient not taking: Reported on 06/08/2014 05/11/14   Quentin Angst, MD  omeprazole (PRILOSEC) 20 MG capsule Take 1 capsule (20 mg total) by mouth 2 (two) times daily before a meal. Patient not taking: Reported on 07/12/2014  06/11/14   Ambrose Finland, NP  ondansetron (ZOFRAN) 4 MG tablet Take 1 tablet (4 mg total) by mouth every 8 (eight) hours as needed for nausea or vomiting. Patient not taking: Reported on 06/08/2014 05/11/14   Quentin Angst, MD  pantoprazole (PROTONIX) 40 MG tablet Take 1 tablet (40 mg total) by mouth daily. Patient not taking: Reported on 06/08/2014 05/11/14   Quentin Angst, MD  promethazine (PHENERGAN) 25 MG tablet Take 1 tablet (25 mg total) by mouth every 6 (six) hours as needed for nausea or vomiting. Patient not taking: Reported on 07/12/2014 06/08/14   Ambrose Finland, NP   BP 150/94 mmHg  Pulse 95  Temp(Src) 98.5 F (36.9 C) (Oral)  Resp 18  SpO2 100% Physical Exam  Constitutional: He is oriented to person, place, and time. He appears well-developed and well-nourished. No distress.  HENT:  Head: Atraumatic.  Mouth/Throat: Oropharynx is clear and moist.  Eyes: Conjunctivae are normal. No scleral icterus.  Neck: Normal range of motion. Neck supple. No tracheal deviation present.  Cardiovascular: Normal rate, regular rhythm, normal heart sounds and intact distal pulses.  Exam reveals no gallop and no friction rub.   No murmur heard. Pulmonary/Chest: Effort normal and breath sounds normal. No accessory muscle usage. No respiratory distress.  Abdominal: Soft. Bowel sounds are normal. He exhibits no distension and no mass. There is no tenderness. There is no rebound and no guarding.  Genitourinary:  No cva tenderness  Musculoskeletal: Normal range of motion.  Spine non tender.   Neurological: He is alert and oriented to person, place, and time.  Steady gait.   Skin: Skin is warm and dry.  Psychiatric: He has a normal mood and affect.  Nursing note and vitals reviewed.   ED Course  Procedures (including critical care time) Labs Review  Results for orders placed or performed during the hospital encounter of 07/12/14  CBC with Differential  Result Value Ref Range   WBC  8.0 4.0 - 10.5 K/uL   RBC 5.14 4.22 - 5.81 MIL/uL   Hemoglobin 11.7 (L) 13.0 - 17.0 g/dL   HCT 16.1 (L) 09.6 - 04.5 %   MCV 74.7 (L) 78.0 - 100.0 fL   MCH 22.8 (L) 26.0 - 34.0 pg   MCHC 30.5 30.0 - 36.0 g/dL   RDW 40.9 (H) 81.1 - 91.4 %   Platelets 344 150 - 400 K/uL   Neutrophils Relative % 71 43 - 77 %   Lymphocytes Relative 23 12 - 46 %   Monocytes Relative 5 3 - 12 %   Eosinophils Relative 1 0 - 5 %   Basophils Relative 0 0 - 1 %   Neutro Abs 5.7 1.7 - 7.7 K/uL   Lymphs Abs 1.8 0.7 - 4.0 K/uL   Monocytes Absolute 0.4 0.1 - 1.0 K/uL   Eosinophils Absolute 0.1 0.0 - 0.7 K/uL   Basophils Absolute 0.0 0.0 - 0.1 K/uL   Smear Review MORPHOLOGY UNREMARKABLE   Comprehensive metabolic panel  Result Value  Ref Range   Sodium 140 135 - 145 mmol/L   Potassium 3.7 3.5 - 5.1 mmol/L   Chloride 105 96 - 112 mEq/L   CO2 23 19 - 32 mmol/L   Glucose, Bld 93 70 - 99 mg/dL   BUN 14 6 - 23 mg/dL   Creatinine, Ser 1.610.83 0.50 - 1.35 mg/dL   Calcium 9.8 8.4 - 09.610.5 mg/dL   Total Protein 8.5 (H) 6.0 - 8.3 g/dL   Albumin 5.0 3.5 - 5.2 g/dL   AST 18 0 - 37 U/L   ALT 11 0 - 53 U/L   Alkaline Phosphatase 76 39 - 117 U/L   Total Bilirubin 0.5 0.3 - 1.2 mg/dL   GFR calc non Af Amer >90 >90 mL/min   GFR calc Af Amer >90 >90 mL/min   Anion gap 12 5 - 15  Lipase, blood  Result Value Ref Range   Lipase 20 11 - 59 U/L      MDM   Labs sent from triage.  Reviewed nursing notes and prior charts for additional history.   Reviewed prior imaging from this fall when presented w similar symptoms, u/s and ct abd neg acute.  Pt has ride, does not have to drive.  Dilaudid 1 mg im for pain. zofran for nausea. pepcid and gi cocktail given for symptom relief.    Pt states has f/u arranged in 2 days time w [facility] - is encouraged to keep that follow up. Return precautions discussed.     Suzi RootsKevin E Sabrie Moritz, MD 07/12/14 [phone]

## 2014-07-12 NOTE — ED Notes (Addendum)
Pt states he normally has abd pain but ever since he ate pork 3 days ago his stomach has been worse. 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 Pt c/o n/v as well.

## 2014-07-12 NOTE — Discharge Instructions (Signed)
It was our pleasure to provide your ER care today - we hope that you feel better.  Continue protonix.  Try maalox or mylanta as need.  You may take hydrocodone as need for pain. No driving when taking hydrocodone. Also, do not take tylenol or acetaminophen containing medication when taking hydrocodone.  Follow up with GI specialist as planned later this week.  Return to ER if worse, new symptoms, fevers, persistent vomiting, other concern.  You were given pain medication in the ER - no driving for the next 4 hours.     Abdominal Pain Many things can cause abdominal pain. Usually, abdominal pain is not caused by a disease and will improve without treatment. It can often be observed and treated at home. Your health care provider will do a physical exam and possibly order blood tests and X-rays to help determine the seriousness of your pain. However, in many cases, more time must pass before a clear cause of the pain can be found. Before that point, your health care provider may not know if you need more testing or further treatment. HOME CARE INSTRUCTIONS  Monitor your abdominal pain for any changes. The following actions may help to alleviate any discomfort you are experiencing:  Only take over-the-counter or prescription medicines as directed by your health care provider.  Do not take laxatives unless directed to do so by your health care provider.  Try a clear liquid diet (broth, tea, or water) as directed by your health care provider. Slowly move to a bland diet as tolerated. SEEK MEDICAL CARE IF:  You have unexplained abdominal pain.  You have abdominal pain associated with nausea or diarrhea.  You have pain when you urinate or have a bowel movement.  You experience abdominal pain that wakes you in the night.  You have abdominal pain that is worsened or improved by eating food.  You have abdominal pain that is worsened with eating fatty foods.  You have a fever. SEEK  IMMEDIATE MEDICAL CARE IF:   Your pain does not go away within 2 hours.  You keep throwing up (vomiting).  Your pain is felt only in portions of the abdomen, such as the right side or the left lower portion of the abdomen.  You pass bloody or black tarry stools. MAKE SURE YOU:  Understand these instructions.   Will watch your condition.   Will get help right away if you are not doing well or get worse.  Document Released: 04/02/2005 Document Revised: 06/28/2013 Document Reviewed: 03/02/2013 [facility] Patient Information 2015 [employer] [place]. This information is not intended to replace advice given to you by your health care provider. Make sure you discuss any questions you have with your health care provider.

## 2014-07-14 ENCOUNTER — Encounter (HOSPITAL_COMMUNITY): Payer: Self-pay

## 2014-07-14 ENCOUNTER — Emergency Department (HOSPITAL_COMMUNITY)
Admission: EM | Admit: 2014-07-14 | Discharge: 2014-07-14 | Disposition: A | Discharge status: Home / Self Care | Payer: No Typology Code available for payment source | Attending: Emergency Medicine | Admitting: Emergency Medicine

## 2014-07-14 ENCOUNTER — Telehealth: Payer: Self-pay | Admitting: Internal Medicine

## 2014-07-14 ENCOUNTER — Ambulatory Visit (INDEPENDENT_AMBULATORY_CARE_PROVIDER_SITE_OTHER): Payer: Self-pay | Admitting: Gastroenterology

## 2014-07-14 ENCOUNTER — Encounter: Payer: Self-pay | Admitting: Gastroenterology

## 2014-07-14 VITALS — BP 110/60 | HR 68 | Ht 66.5 in | Wt 133.1 lb

## 2014-07-14 DIAGNOSIS — R1011 Right upper quadrant pain: Secondary | ICD-10-CM | POA: Insufficient documentation

## 2014-07-14 DIAGNOSIS — R45851 Suicidal ideations: Secondary | ICD-10-CM | POA: Insufficient documentation

## 2014-07-14 DIAGNOSIS — R1013 Epigastric pain: Secondary | ICD-10-CM | POA: Insufficient documentation

## 2014-07-14 DIAGNOSIS — R140 Abdominal distension (gaseous): Secondary | ICD-10-CM

## 2014-07-14 DIAGNOSIS — Z862 Personal history of diseases of the blood and blood-forming organs and certain disorders involving the immune mechanism: Secondary | ICD-10-CM | POA: Insufficient documentation

## 2014-07-14 DIAGNOSIS — Z79899 Other long term (current) drug therapy: Secondary | ICD-10-CM | POA: Insufficient documentation

## 2014-07-14 DIAGNOSIS — Z8659 Personal history of other mental and behavioral disorders: Secondary | ICD-10-CM | POA: Insufficient documentation

## 2014-07-14 DIAGNOSIS — G8929 Other chronic pain: Secondary | ICD-10-CM

## 2014-07-14 DIAGNOSIS — R109 Unspecified abdominal pain: Secondary | ICD-10-CM

## 2014-07-14 DIAGNOSIS — R112 Nausea with vomiting, unspecified: Secondary | ICD-10-CM | POA: Insufficient documentation

## 2014-07-14 DIAGNOSIS — Z8719 Personal history of other diseases of the digestive system: Secondary | ICD-10-CM | POA: Insufficient documentation

## 2014-07-14 DIAGNOSIS — R110 Nausea: Secondary | ICD-10-CM

## 2014-07-14 DIAGNOSIS — Z9049 Acquired absence of other specified parts of digestive tract: Secondary | ICD-10-CM | POA: Insufficient documentation

## 2014-07-14 LAB — CBC WITH DIFFERENTIAL/PLATELET
BASOS ABS: 0 10*3/uL (ref 0.0–0.1)
BASOS PCT: 0 % (ref 0–1)
Eosinophils Absolute: 0.2 10*3/uL (ref 0.0–0.7)
Eosinophils Relative: 2 % (ref 0–5)
HCT: 42 % (ref 39.0–52.0)
Hemoglobin: 13.2 g/dL (ref 13.0–17.0)
Lymphocytes Relative: 23 % (ref 12–46)
Lymphs Abs: 2.7 10*3/uL (ref 0.7–4.0)
MCH: 23.3 pg — AB (ref 26.0–34.0)
MCHC: 31.4 g/dL (ref 30.0–36.0)
MCV: 74.2 fL — ABNORMAL LOW (ref 78.0–100.0)
MONO ABS: 0.5 10*3/uL (ref 0.1–1.0)
MONOS PCT: 5 % (ref 3–12)
NEUTROS ABS: 8.4 10*3/uL — AB (ref 1.7–7.7)
Neutrophils Relative %: 70 % (ref 43–77)
PLATELETS: 347 10*3/uL (ref 150–400)
RBC: 5.66 MIL/uL (ref 4.22–5.81)
RDW: 16.3 % — ABNORMAL HIGH (ref 11.5–15.5)
WBC: 11.9 10*3/uL — ABNORMAL HIGH (ref 4.0–10.5)

## 2014-07-14 LAB — COMPREHENSIVE METABOLIC PANEL
ALT: 13 U/L (ref 0–53)
AST: 26 U/L (ref 0–37)
Albumin: 5.6 g/dL — ABNORMAL HIGH (ref 3.5–5.2)
Alkaline Phosphatase: 86 U/L (ref 39–117)
Anion gap: 10 (ref 5–15)
BUN: 18 mg/dL (ref 6–23)
CHLORIDE: 105 meq/L (ref 96–112)
CO2: 26 mmol/L (ref 19–32)
Calcium: 10.3 mg/dL (ref 8.4–10.5)
Creatinine, Ser: 0.96 mg/dL (ref 0.50–1.35)
GFR calc non Af Amer: >90 mL/min (ref >90)
Glucose, Bld: 93 mg/dL (ref 70–99)
Potassium: 3.4 mmol/L — ABNORMAL LOW (ref 3.5–5.1)
Sodium: 141 mmol/L (ref 135–145)
TOTAL PROTEIN: 9.7 g/dL — AB (ref 6.0–8.3)
Total Bilirubin: 0.6 mg/dL (ref 0.3–1.2)

## 2014-07-14 LAB — URINALYSIS, ROUTINE W REFLEX MICROSCOPIC
Bilirubin Urine: NEGATIVE
Glucose, UA: NEGATIVE mg/dL
Hgb urine dipstick: NEGATIVE
Ketones, ur: NEGATIVE mg/dL
Leukocytes, UA: NEGATIVE
Nitrite: NEGATIVE
PH: 7 (ref 5.0–8.0)
Protein, ur: NEGATIVE mg/dL
SPECIFIC GRAVITY, URINE: 1.025 (ref 1.005–1.030)
Urobilinogen, UA: 1 mg/dL (ref 0.0–1.0)

## 2014-07-14 LAB — RAPID URINE DRUG SCREEN, HOSP PERFORMED
Amphetamines: NOT DETECTED
Barbiturates: POSITIVE — AB
Benzodiazepines: NOT DETECTED
Cocaine: NOT DETECTED
OPIATES: POSITIVE — AB
TETRAHYDROCANNABINOL: POSITIVE — AB

## 2014-07-14 LAB — LIPASE, BLOOD: LIPASE: 47 U/L (ref 11–59)

## 2014-07-14 MED ORDER — KETOROLAC TROMETHAMINE 60 MG/2ML IM SOLN
30.0000 mg | Freq: Once | INTRAMUSCULAR | Status: AC
  Administered 2014-07-14: 30 mg via INTRAMUSCULAR
  Filled 2014-07-14: qty 2

## 2014-07-14 MED ORDER — ONDANSETRON HCL 4 MG PO TABS: 4.0000 mg | ORAL_TABLET | Freq: Two times a day (BID) | ORAL | Status: DC

## 2014-07-14 NOTE — Progress Notes (Signed)
HPI: This is a   Very pleasant 28 year old man whom I am meeting for the first time today     He was hospitalized about one year ago in January 2015 after nausea and vomiting and abdominal pain episodes. Imaging by CT suggested a small amount of pneumomediastinum. [facility] was consulted and saw him for several days during that admission. Further imaging showed no sign of esophageal perforation. They advised against upper endoscopic evaluation given possible recent esophageal perforation. He spent 4-6 days in the hospital and eventually went home. Since then he has re-presented with a variety of abdominal complaints, vomiting.  He had a repeat CT scan 04/2014  And [place] 04/2014 without clear etiology of his symptoms  Cbc, cmet this week were essentially normal.  He is homeless.  In middle on night 6 years ago had abd pain, vomiting.  Has had it ever since then.  He has daily nausea, vomiting.  He has tried phenergan, zofran on PRN basis.  Overall his weight fluctuates.  He had EGD in [place] 2011.      He had GB removal in 2011 in [place].   Review of systems: Pertinent positive and negative review of systems were noted in the above HPI section. Complete review of systems was performed and was otherwise normal.    Past Medical History  Diagnosis Date  . Medical history non-contributory   . Anxiety   . Panic attack as reaction to stress   . Anemia   . Depression   . Gallstones     Past Surgical History  Procedure Laterality Date  . Cholecystectomy    . Tonsillectomy    . Incise and drain abcess    . Mouth surgery      Current Outpatient Prescriptions  Medication Sig Dispense Refill  . HYDROcodone-acetaminophen (NORCO/VICODIN) 5-325 MG per tablet Take 1-2 tablets by mouth every 6 (six) hours as needed for moderate pain. 15 tablet 0  . omeprazole (PRILOSEC) 20 MG capsule Take 1 capsule (20 mg total) by mouth 2 (two) times daily before a meal. (Patient taking  differently: Take 20 mg by mouth as needed. ) 28 capsule 0  . pantoprazole (PROTONIX) 40 MG tablet Take 1 tablet (40 mg total) by mouth daily. 30 tablet 0   No current facility-administered medications for this visit.    Allergies as of 07/14/2014 - Review Complete 07/14/2014  Allergen Reaction Noted  . Tramadol Other (See Comments) 12/20/2013  . Zoloft [sertraline hcl] Palpitations 08/19/2013    Family History  Problem Relation Age of Onset  . Irritable bowel syndrome Mother   . Ulcerative colitis Maternal Grandfather   . Colon cancer Neg Hx   . Colon polyps Neg Hx   . Kidney disease Mother   . Gallbladder disease Neg Hx   . Esophageal cancer Neg Hx   . Diabetes Neg Hx     History   Social History  . Marital Status: Single    Spouse Name: N/A    Number of Children: 0  . Years of Education: N/A   Occupational History  . Homeless    Social History Main Topics  . Smoking status: Never Smoker   . Smokeless tobacco: Never Used  . Alcohol Use: No  . Drug Use: Yes    Special: Marijuana     Comment: occasionally  . Sexual Activity: Not on file   Other Topics Concern  . Not on file   Social History Narrative  Physical Exam: BP 110/60 mmHg  Pulse 68  Ht 5' 6.5" (1.689 m)  Wt 133 lb 2 oz (60.385 kg)  BMI 21.17 kg/m2 Constitutional: generally well-appearing Psychiatric: alert and oriented x3 Eyes: extraocular movements intact Mouth: oral pharynx moist, no lesions Neck: supple no lymphadenopathy Cardiovascular: heart regular rate and rhythm Lungs: clear to auscultation bilaterally Abdomen: soft, nontender, nondistended, no obvious ascites, no peritoneal signs, normal bowel sounds Extremities: no lower extremity edema bilaterally Skin: no lesions on visible extremities    Assessment and plan: 29 y.o. male with   Chronic intermittent nausea, vomiting, abdominal pains   he tells me his gallbladder was removed years ago for gallstone disease. Perhaps  these pains he is having is related to residual, recurrent bile duct stone. We will arrange for MRI with MRCP images to check for retained bile duct stone. If that is negative then I will likely proceed with EGD. He takes a fair amount of daily NSAIDs and only when necessary proton pump inhibitor. I recommended he try to avoid NSAIDs as best as possible. He takes these for dental issues. I explained that Tylenol is much safer for the stomach overall. I did recommend that he start taking his omeprazole on a daily scheduled basis. For his chronic intermittent nausea I'm giving him a prescription for Zofran and I recommended he take it twice a day on a scheduled basis. He asked about stronger pain medicines and I explained that I would not be prescribing narcotic pain medicines.

## 2014-07-14 NOTE — ED Notes (Signed)
Per EMS, pt just left our ED with abdominal pain.  Was d/c out.  [facility]  [facility]. Pt continues with abdominal pain and wants additional evaluation.  Vitals: 144/86, hr 96, resp 16,

## 2014-07-14 NOTE — ED Provider Notes (Signed)
CSN: [id number]     Arrival date & time 07/14/14  1009 History   First MD Initiated Contact with Patient 07/14/14 1109     Chief Complaint  Patient presents with  . Abdominal Pain  . Suicidal     (Consider location/radiation/quality/duration/timing/severity/associated sxs/prior Treatment) Patient is a 29 y.o. male presenting with abdominal pain.  Abdominal Pain Pain location:  RUQ and epigastric Pain quality: cramping and sharp   Pain radiates to:  Does not radiate Pain severity:  Moderate Onset quality:  Gradual Timing:  Constant Progression:  Unchanged Chronicity:  Chronic Context comment:  6 years of abd pain, seen by GI this morning who "didn't do anything" Relieved by: narcotics. Worsened by:  Palpation   Past Medical History  Diagnosis Date  . Medical history non-contributory   . Anxiety   . Panic attack as reaction to stress   . Anemia   . Depression   . Gallstones    Past Surgical History  Procedure Laterality Date  . Cholecystectomy    . Tonsillectomy    . Incise and drain abcess    . Mouth surgery     Family History  Problem Relation Age of Onset  . Irritable bowel syndrome Mother   . Ulcerative colitis Maternal Grandfather   . Colon cancer Neg Hx   . Colon polyps Neg Hx   . Kidney disease Mother   . Gallbladder disease Neg Hx   . Esophageal cancer Neg Hx   . Diabetes Neg Hx    History  Substance Use Topics  . Smoking status: Never Smoker   . Smokeless tobacco: Never Used  . Alcohol Use: No    Review of Systems  Gastrointestinal: Positive for abdominal pain.  All other systems reviewed and are negative.     Allergies  Tramadol and Zoloft  Home Medications   Prior to Admission medications   Medication Sig Start Date End Date Taking? Authorizing Provider  HYDROcodone-acetaminophen (NORCO/VICODIN) 5-325 MG per tablet Take 1-2 tablets by mouth every 6 (six) hours as needed for moderate pain. 07/12/14  Yes Suzi RootsKevin E Steinl, MD  omeprazole  (PRILOSEC) 20 MG capsule Take 1 capsule (20 mg total) by mouth 2 (two) times daily before a meal. Patient taking differently: Take 20 mg by mouth as needed.  06/11/14  Yes Ambrose FinlandValerie A Keck, NP  ondansetron (ZOFRAN) 4 MG tablet Take 1 tablet (4 mg total) by mouth 2 (two) times daily. Patient not taking: Reported on 07/14/2014 07/14/14   Rachael Feeaniel P Jacobs, MD  pantoprazole (PROTONIX) 40 MG tablet Take 1 tablet (40 mg total) by mouth daily. 07/12/14   Suzi RootsKevin E Steinl, MD   BP 148/97 mmHg  Pulse 89  Temp(Src) 97.7 F (36.5 C) (Oral)  Resp 18  SpO2 100% Physical Exam  Constitutional: He is oriented to person, place, and time. He appears well-developed and well-nourished.  HENT:  Head: Normocephalic and atraumatic.  Eyes: Conjunctivae and EOM are normal.  Neck: Normal range of motion. Neck supple.  Cardiovascular: Normal rate, regular rhythm and normal heart sounds.   Pulmonary/Chest: Effort normal and breath sounds normal. No respiratory distress.  Abdominal: He exhibits no distension. There is tenderness (mild) in the right upper quadrant and epigastric area. There is no rebound and no guarding.  Musculoskeletal: Normal range of motion.  Neurological: He is alert and oriented to person, place, and time.  Skin: Skin is warm and dry.  Vitals reviewed.   ED Course  Procedures (including critical care time) Labs  Review Labs Reviewed  COMPREHENSIVE METABOLIC PANEL - Abnormal; Notable for the following:    Potassium 3.4 (*)    Total Protein 9.7 (*)    Albumin 5.6 (*)    All other components within normal limits  CBC WITH DIFFERENTIAL - Abnormal; Notable for the following:    WBC 11.9 (*)    MCV 74.2 (*)    MCH 23.3 (*)    RDW 16.3 (*)    Neutro Abs 8.4 (*)    All other components within normal limits  URINE RAPID DRUG SCREEN (HOSP PERFORMED) - Abnormal; Notable for the following:    Opiates POSITIVE (*)    Tetrahydrocannabinol POSITIVE (*)    Barbiturates POSITIVE (*)    All other components  within normal limits  LIPASE, BLOOD  URINALYSIS, ROUTINE W REFLEX MICROSCOPIC    Imaging Review No results found.   EKG Interpretation None      MDM   Final diagnoses:  Abdominal pain, chronic, right upper quadrant  Passive suicidal ideations    29 y.o. male with pertinent PMH of chronic abd pain with GI visit this am with suggested MRCP presents with chronic abd pain.  Pt denies acute worsening, however was upset that GI "didn't do anything" this am.  Historically, he denies recent change in symptoms, fever, and states that although he occasionally has nausea and vomiting, he has not had any acute worsening.  I spoke to the patient at length re: his chronic pain and attempted to allay concerns.  I encouraged the pt to discontinue marijuana use, as it could both be causing his symptoms, and would prevent him from seeing a pain clinic.  The pt responded that he would never quit smoking marijuana and that it was the only thing that treated his pain.  He stated that he would not harm himself today if he left, but was not sure if he would harm himself in the future.  He had no plan for doing so.  SI precautions ordered.  Pt refused to give up belongings and became verbally abusive toward staff.  In an attempt to allay his concerns, I allowed him to keep his cell phone against protocol.  Although on my examination, the pt did not groan until I got up to leave the room, he then began to persistently groan and laid on the floor of the room.  I reassessed the pt and he would speak in clear sentences on the phone and was not groaning throughout this time.  Labwork as above unremarkable.  Do not feel that imaging is warranted at this time given chronic nature of pain.  Psych consulted.  Psych feels pt stable for outpt therapy.  Pt dc in stable condition with standard return precautions.  I informed the pt numerous times that I would not provide prescriptions for narcotics, which the pt stated he did not  want, however when I asked the pt what he did want, he stated that all of the medicines I suggested (tylenol, ibuprofen, toradol) would not work.  As he is stable, dc home to fu with gi.  He was escorted by security out of the building after he continued to curse at staff.  I have reviewed all laboratory and imaging studies if ordered as above  1. Abdominal pain, chronic, right upper quadrant   2. Passive suicidal ideations         Mirian Mo, MD 07/14/14 1606

## 2014-07-14 NOTE — ED Notes (Signed)
TTS at bedside. 

## 2014-07-14 NOTE — ED Notes (Addendum)
Spoke with MD, states pt is still to be discharged with follow up with [facility] with MRI scheduled for 1/21. Pt is aware to take Tylenol as needed and Omeprazole daily. Pt states that he is unable to sign acknowledging discharge instructions stating "I can barely walk." Pt offered wheelchair by writer but pt stated "I'd rather hobble out of this bitch." Security at bedside to assist pt to discharge.

## 2014-07-14 NOTE — Discharge Instructions (Signed)

## 2014-07-14 NOTE — ED Notes (Signed)
Pt is being discharged by psych team, pt still complaining about abdominal pain, and wants to know why he is still in pain. Pt also wanting to file complaint for treatment.

## 2014-07-14 NOTE — BH Assessment (Addendum)
Writer informed TTS Olivia Mackieressa of the consult.

## 2014-07-14 NOTE — ED Notes (Signed)
Pt continually calling out, stating no one has seen him. Spoke with Dr. Littie DeedsGentry, whom has assessed pt, and states that he may be placed in [facility] once labs result. Pt noted to be rolling in bed, coming out of room, needing redirection by multiple staff members. Staff requested belongings per policy for SI patients. Pt refused. MD aware. States pt may have cell phone at this time. Pt's mother called, tearful, stating she wanted to be sure her son was being treated. Pt allowed to talk to mother at this time as well.

## 2014-07-14 NOTE — Discharge Instructions (Signed)
Abdominal Pain °Many things can cause abdominal pain. Usually, abdominal pain is not caused by a disease and will improve without treatment. It can often be observed and treated at home. Your health care provider will do a physical exam and possibly order blood tests and X-rays to help determine the seriousness of your pain. However, in many cases, more time must pass before a clear cause of the pain can be found. Before that point, your health care provider may not know if you need more testing or further treatment. °HOME CARE INSTRUCTIONS  °Monitor your abdominal pain for any changes. The following actions may help to alleviate any discomfort you are experiencing: °· Only take over-the-counter or prescription medicines as directed by your health care provider. °· Do not take laxatives unless directed to do so by your health care provider. °· Try a clear liquid diet (broth, tea, or water) as directed by your health care provider. Slowly move to a bland diet as tolerated. °SEEK MEDICAL CARE IF: °· You have unexplained abdominal pain. °· You have abdominal pain associated with nausea or diarrhea. °· You have pain when you urinate or have a bowel movement. °· You experience abdominal pain that wakes you in the night. °· You have abdominal pain that is worsened or improved by eating food. °· You have abdominal pain that is worsened with eating fatty foods. °· You have a fever. °SEEK IMMEDIATE MEDICAL CARE IF:  °· Your pain does not go away within 2 hours. °· You keep throwing up (vomiting). °· Your pain is felt only in portions of the abdomen, such as the right side or the left lower portion of the abdomen. °· You pass bloody or black tarry stools. °MAKE SURE YOU: °· Understand these instructions.   °· Will watch your condition.   °· Will get help right away if you are not doing well or get worse.   °Document Released: 04/02/2005 Document Revised: 06/28/2013 Document Reviewed: 03/02/2013 °ExitCare® Patient Information  ©2015 [employer]. This information is not intended to replace advice given to you by your health care provider. Make sure you discuss any questions you have with your health care provider. ° ° °Emergency Department Resource Guide °1) Find a Doctor and Pay Out of Pocket °Although you won't have to find out who is covered by your insurance plan, it is a good idea to ask around and get recommendations. You will then need to call the office and see if the doctor you have chosen will accept you as a new patient and what types of options they offer for patients who are self-pay. Some doctors offer discounts or will set up payment plans for their patients who do not have insurance, but you will need to ask so you aren't surprised when you get to your appointment. ° °2) Contact Your Local Health Department °Not all health departments have doctors that can see patients for sick visits, but many do, so it is worth a call to see if yours does. If you don't know where your local health department is, you can check in your phone book. The CDC also has a tool to help you locate your state's health department, and many state websites also have listings of all of their local health departments. ° °3) Find a Walk-in Clinic °If your illness is not likely to be very severe or complicated, you may want to try a walk in clinic. These are popping up all over the country in pharmacies, drugstores, and shopping centers. They're   usually staffed by nurse practitioners or physician assistants that have been trained to treat common illnesses and complaints. They're usually fairly quick and inexpensive. However, if you have serious medical issues or chronic medical problems, these are probably not your best option. ° °No Primary Care Doctor: °- Call [facility] at  832-8000 - they can help you locate a primary care doctor that  accepts your insurance, provides certain services, etc. °- Physician Referral Service- 1-800-533-3463 ° °Chronic  Pain Problems: °Organization         Address  Phone   Notes  °[facility]  (336) 297-2271 Patients need to be referred by their primary care doctor.  ° °Medication Assistance: °Organization         Address  Phone   Notes  °[place] [facility] [address] °[place], [place] [address] (336) 641-8030 --Must be a resident of [place] °-- Must have NO insurance coverage whatsoever (no Medicaid/ Medicare, etc.) °-- The pt. MUST have a primary care doctor that directs their care regularly and follows them in the community °  °[facility]  (866) 331-1348   °[employer]  (888) 892-1162   ° °Agencies that provide inexpensive medical care: °Organization         Address  Phone   Notes  °[facility]  (336) 832-8035   °[facility]    (336) 832-7272   °[facility] [address] °[place], [place] [address] (336) 832-4777   °[facility] [address], °[place] (336) 271-4999   °[facility]    (336) 373-0678   °[facility]    (336) 272-1050   °[facility] ° [address], [place] Phone:  (336) 832-4444, Fax:  (336) 832-4440 Hours of Operation:  9 am - 6 pm, M-F.  Also accepts Medicaid/Medicare and self-pay.  °[facility] ° [address], [place] Phone: (336) 832-3150, Fax: (336) 832-3151. Hours of Operation:  8:30 am - 5:30 pm, M-F.  Also accepts Medicaid and self-pay.  °[facility] [place] [address], [place] Phone: (336) 878-6027   °[facility] [address], [place], [place] (336)723-1848, Ext. 123 Mondays & Thursdays: 7-9 AM.  First 15 patients are seen on a first come, first serve basis. °  ° °Medicaid-accepting [place] Providers: ° °Organization         Address  Phone   Notes  °[facility] [address], [place] (336) 641-2100 Also  accepts self-pay patients.  °[facility] [address], [place] ° (336) 856-9996   °[facility] [address], [place] (336) 288-8857   °[facility] [address] [place] [address], [place] (336) 299-7000   °Veita Bland [address], [place]  ° (336) 373-1557 Only accepts [place] Access Medicaid patients after they have their name applied to their card.  ° °Self-Pay (no insurance) in [place]: ° °Organization         Address  Phone   Notes  °Sickle Cell Patients, [facility] [address], [place] (336) 832-1970   °[facility] [address],  (336) 832-4400   °[facility] ° [address], [place] (336) 992-4800   °[facility]/Dr. Osei-Bonsu ° [address] [place] [address], [place] or [address], [place] (336) 841-8500 Phone number for both [place] and [place] locations is the same.  °[facility] [address], [place] (336) 299-0000   °[facility] [address] [place] [address], [place] or [address] (336) 852-7530 °(336) 878-2260   °[facility] [address], [place] (336) 350-1642, phone; (336) 294-5005, fax Sees patients 1st and 3rd Saturday of every month.  Must not qualify for public or private insurance (i.e. Medicaid, Medicare, [place] Health Choice, Veterans' Benefits) • Household income should be no more than 200% of the poverty level •The clinic cannot treat you if you are pregnant or think you are pregnant • Sexually transmitted diseases are not treated at the clinic.  ° ° °Dental Care: °Organization         Address  Phone  Notes  °[facility] [address], [place] (336) 641-6152 Accepts children up to age 21 who are enrolled in Medicaid or [place] Health Choice; pregnant  women with a Medicaid card; and children who have applied for Medicaid or [place] Health Choice, but were declined, whose parents can pay a reduced fee at time of service.  °[facility] [place]  [address], [place] (336) 641-7733 Accepts children up to age 21 who are enrolled in Medicaid or [place] Health Choice; pregnant women with a Medicaid card; and children who have applied for Medicaid or [place] Health Choice, but were declined, whose parents can pay a reduced fee at time of service.  °[facility] ° [address], [place] (336) 641-4533 Patients are seen by appointment only. Walk-ins are not accepted. [facility] will see patients 18 years of age and older. °Monday - Tuesday (8am-5pm) °Most Wednesdays (8:30-5pm) °$30 per visit, cash only  °[facility] ° [address], [place] (336) 641-4533 Patients are seen by appointment only. Walk-ins are not accepted. [facility] will see patients 18 years of age and older. °One Wednesday Evening (Monthly: Volunteer Based).  $30 per visit, cash only  °[facility]  (919) 537-3737 for adults; Children under age 4, call Graduate Pediatric Dentistry at (919) [phone]. Children aged 4-14, please call (919) [phone] to request a pediatric application. ° Dental services are provided in all areas of dental care including fillings, crowns and bridges, complete and partial dentures, implants, gum treatment, root canals, and extractions. Preventive care is also provided. Treatment is provided to both adults and children. °Patients are selected via a lottery and there is often a waiting list. °  °[facility] [address], ° ° (336) 763-8833 [email or web address] °  °[facility] [address], [place], [place] (336)723-1848, Ext. 123 Second and Fourth Thursday of each month, opens at 6:30 AM; Clinic ends at 9 AM.  Patients are  seen on a first-come first-served basis, and a limited number are seen during each clinic.  ° °[facility] ° [address], [place], [place] (336) 723-7904   Eligibility Requirements °You must have lived in [place], [place], or [place] counties for at least the last three months. °  You cannot be eligible for state or federal sponsored healthcare insurance, including Veterans Administration, Medicaid, or Medicare. °  You generally cannot be eligible for healthcare insurance through   your employer.  °  How to apply: °Eligibility screenings are held every Tuesday and Wednesday afternoon from 1:00 pm until 4:00 pm. You do not need an appointment for the interview!  °[facility] [address], [place], [place] 336-631-2330   °[facility]  336-342-8273   °[facility]  336-703-3100   °[facility]  336-570-6415   ° °Behavioral Health Resources in the Community: °Intensive Outpatient Programs °Organization         Address  Phone  Notes  °[facility] [address], [place], [place] 336-878-6098   °[facility] [address], [place], [place] 336-832-9800   °[facility] [address], [place], [place] ° 336-882-2125   °[facility] [address],  °[place], [place] 1-800-853-5163 or 336-641-4981   °Substance Abuse Resources °Organization         Address  Phone  Notes  °[facility]  336-882-2125   °[facility]  336-784-9470   °[facility]  336-285-9073   °[facility]  336-845-3988   °Residential & Outpatient Substance Abuse Program  1-800-659-3381   °Psychological Services °Organization         Address  Phone  Notes  °[facility]  336- 832-9600   °[facility]  336- 378-7881   °[facility] [address], [place] 1-800-853-5163 or 336-641-4981   ° °Mobile Crisis  Teams °Organization         Address  Phone  Notes  °[facility], Mobile Crisis Care Unit  1-877-626-1772   °Assertive °[facility] ° [address] [place], [place] 336-834-9664   °Sharon DeEsch [address] °[place] 336-554-5454   ° °Self-Help/Support Groups °Organization         Address  Phone             Notes  °[facility] - variety of support groups  336- 373-1402 Call for more information  °Narcotics Anonymous (NA), [facility] [address], °[place]  2 meetings at this location  ° °Residential Treatment Programs °Organization         Address  Phone  Notes  °[facility] [address],    °[place]  1-866-801-8205   °[facility] ° [address], [place], [place] 704-293-8524   °[facility] [address], [place] 336-845-3988 Admissions: 8am-3pm M-F  °[facility] [address]    °[place], [place] 336-841-1104   °[facility] [address], [place], [place] 336-379-7146   °[facility] [address]  °[place], [place] 336-285-9073   °[facility] Intensive Outpatient [address], [place], [place] 336-852-3033   °[facility] [address]  °[place], [place] 1-877-615-2722 or 336-784-9470   °[facility] [address] [place], [place] 336-227-7417 Accepts Medicaid  °[facility] [address]  °[place] 1-800-659-3381 Substance Abuse/Addiction Treatment  ° °[place] Behavioral Health Resources °Organization         Address  Phone  Notes  °[facility]  (888) 581-9988   °Julie Brannon, PhD [address] [place], [place]   (336) 349-5553 or (336) 951-0000   ° Behavioral   [address] °,  (336) 349-4454   °  [facility] [address], [place], [place] (336) 342-8316  Insurance/Medicaid/sponsorship through [facility]  °[facility] [address]                                    [place], [place] (336) 342-8316 Therapy/tele-psych/case  °[facility] [address]  ° [place], [place] (336) 349-2233    °Dr. Arfeen  (336) 349-4544   °[facility] [place]  [employer] [place] [facility] 1) [address], [place] °2) [address], [place] °3)  [address], [place] (336) 349-3220 °(336) 342-7768 ° °(336) 342-8140   °[place] Child Abuse [facility] (336) 342-1394 or (336) 342-3537 (After Hours)    ° ° ° ° °

## 2014-07-14 NOTE — Telephone Encounter (Signed)
Patient called stating that he went to the ED for his abdominal pain, patient stated that he is still in severe pain and does not have enough medication until his next appointment. Patient states that the ED will no longer give him any more pain medication until he is seen by his primary doctor. Patient would like to speak to nurse as soon as possible. Please f/u with pt.

## 2014-07-14 NOTE — BH Assessment (Signed)
Assessment Note  Levi Palmer is an 29 y.o. male. Patient walked into the ED with a chief complaint of abdominal pain then mentioned to the staff he would rather be dead then deal with the pain per nursing notes.  Patient admit to making that statement but not with intent to hurt himself now or in the near future.  Patient reports walking here from his appointment with Dr. Christella Hartigan a GI specialist at the [facility] because he did not feel like the specialist helped at all.  Patient denies SI,HI, hallucination, other self-injurious behaviors.  Patient denies any previous suicide attempts or inpatient hospitalization.    CSW consulted with Catha Nottingham, NP it is recommended to follow up with outpatient treatment.   Axis I: Anxiety Disorder NOS Axis II: Deferred Axis III:  Past Medical History  Diagnosis Date  . Medical history non-contributory   . Anxiety   . Panic attack as reaction to stress   . Anemia   . Depression   . Gallstones    Axis IV: economic problems, housing problems, occupational problems, other psychosocial or environmental problems, problems related to social environment, problems with access to health care services and problems with primary support group Axis V: 51-60 moderate symptoms  Past Medical History:  Past Medical History  Diagnosis Date  . Medical history non-contributory   . Anxiety   . Panic attack as reaction to stress   . Anemia   . Depression   . Gallstones     Past Surgical History  Procedure Laterality Date  . Cholecystectomy    . Tonsillectomy    . Incise and drain abcess    . Mouth surgery      Family History:  Family History  Problem Relation Age of Onset  . Irritable bowel syndrome Mother   . Ulcerative colitis Maternal Grandfather   . Colon cancer Neg Hx   . Colon polyps Neg Hx   . Kidney disease Mother   . Gallbladder disease Neg Hx   . Esophageal cancer Neg Hx   . Diabetes Neg Hx     Social History:  reports that he has  never smoked. He has never used smokeless tobacco. He reports that he uses illicit drugs (Marijuana). He reports that he does not drink alcohol.  Additional Social History:     CIWA: CIWA-Ar BP: 148/97 mmHg Pulse Rate: 89 COWS:    Allergies:  Allergies  Allergen Reactions  . Tramadol Other (See Comments)    Jittery   . Zoloft [Sertraline Hcl] Palpitations    Home Medications:  (Not in a hospital admission)  OB/GYN Status:  No LMP for male patient.  General Assessment Data Location of Assessment: [facility] ED ACT Assessment: Yes Is this a Tele or Face-to-Face Assessment?: Face-to-Face Is this an Initial Assessment or a Re-assessment for this encounter?: Initial Assessment Living Arrangements: Other (Comment) (homeless) Can pt return to current living arrangement?: Yes Admission Status: Voluntary Is patient capable of signing voluntary admission?: Yes Transfer from: Home Referral Source: Self/Family/Friend  Medical Screening Exam [facility] Walk-in ONLY) Medical Exam completed: Yes  [facility] Crisis Care Plan Living Arrangements: Other (Comment) (homeless) Name of Psychiatrist: none Name of Therapist: none  Education Status Is patient currently in school?: No  Risk to self with the past 6 months Suicidal Ideation: No-Not Currently/Within Last 6 Months Suicidal Intent: No-Not Currently/Within Last 6 Months Is patient at risk for suicide?: No Suicidal Plan?: No-Not Currently/Within Last 6 Months Access to Means: No What has been  your use of drugs/alcohol within the last 12 months?: denies Previous Attempts/Gestures: No Intentional Self Injurious Behavior: None Family Suicide History: No Recent stressful life event(s): Conflict (Comment), Loss (Comment), Job Loss, Financial Problems, Other (Comment) (chronic medical) Persecutory voices/beliefs?: No Depression: Yes Depression Symptoms: Feeling angry/irritable Substance abuse history and/or treatment for substance abuse?: No  Risk  to Others within the past 6 months Homicidal Ideation: No-Not Currently/Within Last 6 Months Thoughts of Harm to Others: No-Not Currently Present/Within Last 6 Months Current Homicidal Intent: No-Not Currently/Within Last 6 Months Current Homicidal Plan: No-Not Currently/Within Last 6 Months Access to Homicidal Means: No History of harm to others?: No Assessment of Violence: None Noted Does patient have access to weapons?: No Criminal Charges Pending?: No Does patient have a court date: No  Psychosis Hallucinations: None noted Delusions: None noted  Mental Status Report Appear/Hygiene: In hospital gown Eye Contact: Fair Motor Activity: Agitation, Restlessness Speech: Pressured Level of Consciousness: Alert Mood: Anxious, Irritable Affect: Irritable, Anxious Anxiety Level: Moderate Thought Processes: Coherent Judgement: Unimpaired Orientation: Person, Place, Time, Situation Obsessive Compulsive Thoughts/Behaviors: None  Cognitive Functioning Concentration: Fair Memory: Recent Intact, Remote Intact IQ: Average Insight: Fair Impulse Control: Fair Appetite: Poor Sleep: Decreased Vegetative Symptoms: None  ADLScreening [facility] Assessment Services) Patient's cognitive ability adequate to safely complete daily activities?: Yes Patient able to express need for assistance with ADLs?: Yes Independently performs ADLs?: Yes (appropriate for developmental age)  Prior Inpatient Therapy Prior Inpatient Therapy: No  Prior Outpatient Therapy Prior Outpatient Therapy: Yes Prior Therapy Facilty/Provider(s): [facility] Reason for Treatment: Depression  ADL Screening (condition at time of admission) Patient's cognitive ability adequate to safely complete daily activities?: Yes Patient able to express need for assistance with ADLs?: Yes Independently performs ADLs?: Yes (appropriate for developmental age)             Advance Directives (For Healthcare) Does patient have an  advance directive?: No Would patient like information on creating an advanced directive?: No - patient declined information    Additional Information 1:1 In Past 12 Months?: No CIRT Risk: No Elopement Risk: No Does patient have medical clearance?: Yes     Disposition:  Disposition Initial Assessment Completed for this Encounter: Yes Disposition of Patient: Outpatient treatment Type of outpatient treatment: Adult  On Site Evaluation by:   Reviewed with Physician:    Maryelizabeth Rowanorbett, Eathel Pajak A 07/14/2014 12:54 PM

## 2014-07-14 NOTE — Telephone Encounter (Signed)
Pt called complaining abdominal pain got stronger, stated was in the ER twice today with out any changes. Gi doctor advised him to take tylenol for pain. Advised to continue taking omeprazole daily and take tylenol for pain as GI stated. Has F/U appointment with PCP

## 2014-07-14 NOTE — ED Notes (Addendum)
Pt states abdominal pain chronic for 6 years.  Was seen last week for same.  States he has been told to have an MRI.  Was seen by GI this morning which he states did nothing for him.  C/O no fever.  No change in urination.  Has some constipation.  Pt is upset in triage. Stating he is not getting help.  "would rather be dead than deal with this".  Has sought behavioral health here before for SI and told "he cannot get treatment". No specific plans voiced at this time.

## 2014-07-14 NOTE — ED Notes (Signed)
Pt moaning and groaning on floor. I attempted to try and get pt on floor, pt refuses to get off floor stating he is comfortable.  I explained med clearance procedure to pt again, as well as Sharman CheekJason Upham RN.  Pt is looking for his charger

## 2014-07-14 NOTE — ED Notes (Signed)
MD at bedside. 

## 2014-07-14 NOTE — ED Provider Notes (Signed)
CSN: [id number]     Arrival date & time 07/14/14  1506 History   None    Chief Complaint  Patient presents with  . Abdominal Pain     (Consider location/radiation/quality/duration/timing/severity/associated sxs/prior Treatment) Patient is a 29 y.o. male presenting with abdominal pain.  Abdominal Pain Pain location:  RUQ and epigastric Pain quality: aching and cramping   Pain radiates to:  Does not radiate Pain severity:  Severe Onset quality:  Gradual Duration: 6 years. Timing:  Constant Progression:  Unchanged Chronicity:  New Context comment:  Chronic MJ use Relieved by:  Nothing Worsened by:  Nothing tried Associated symptoms: anorexia, nausea and vomiting   Associated symptoms: no chills, no diarrhea, no fever and no shortness of breath     Past Medical History  Diagnosis Date  . Medical history non-contributory   . Anxiety   . Panic attack as reaction to stress   . Anemia   . Depression   . Gallstones    Past Surgical History  Procedure Laterality Date  . Cholecystectomy    . Tonsillectomy    . Incise and drain abcess    . Mouth surgery     Family History  Problem Relation Age of Onset  . Irritable bowel syndrome Mother   . Ulcerative colitis Maternal Grandfather   . Colon cancer Neg Hx   . Colon polyps Neg Hx   . Kidney disease Mother   . Gallbladder disease Neg Hx   . Esophageal cancer Neg Hx   . Diabetes Neg Hx    History  Substance Use Topics  . Smoking status: Never Smoker   . Smokeless tobacco: Never Used  . Alcohol Use: No    Review of Systems  Constitutional: Negative for fever and chills.  Respiratory: Negative for shortness of breath.   Gastrointestinal: Positive for nausea, vomiting, abdominal pain and anorexia. Negative for diarrhea.  All other systems reviewed and are negative.     Allergies  Tramadol and Zoloft  Home Medications   Prior to Admission medications   Medication Sig Start Date End Date Taking? Authorizing  Provider  HYDROcodone-acetaminophen (NORCO/VICODIN) 5-325 MG per tablet Take 1-2 tablets by mouth every 6 (six) hours as needed for moderate pain. 07/12/14  Yes Suzi Roots, MD  omeprazole (PRILOSEC) 20 MG capsule Take 1 capsule (20 mg total) by mouth 2 (two) times daily before a meal. Patient taking differently: Take 20 mg by mouth as needed.  06/11/14  Yes Ambrose Finland, NP  ondansetron (ZOFRAN) 4 MG tablet Take 1 tablet (4 mg total) by mouth 2 (two) times daily. Patient not taking: Reported on 07/14/2014 07/14/14   Rachael Fee, MD  pantoprazole (PROTONIX) 40 MG tablet Take 1 tablet (40 mg total) by mouth daily. 07/12/14   Suzi Roots, MD   BP 152/86 mmHg  Pulse 110  Temp(Src) 98.5 F (36.9 C) (Oral)  Resp 18  SpO2 100% Physical Exam  Constitutional: He appears well-developed and well-nourished.  HENT:  Head: Normocephalic and atraumatic.  Eyes: Conjunctivae and EOM are normal.  Neck: Normal range of motion.  Cardiovascular: Normal rate and regular rhythm.   Pulmonary/Chest: Effort normal. No respiratory distress.  Abdominal: He exhibits no distension.  Musculoskeletal: Normal range of motion.  Neurological: He is alert.  Skin: Skin is warm and dry.  Vitals reviewed.   ED Course  Procedures (including critical care time) Labs Review Labs Reviewed - No data to display  Imaging Review No results found.  EKG Interpretation None      MDM   Final diagnoses:  Abdominal pain, chronic, right upper quadrant    29 y.o. male with pertinent PMH of chronic abd pain presents with recurrent abd pain.  I evaluated the patient earlier in the day. He was seen by GI for his same chronic abdominal pain.  I evaluated him here and he had an unremarkable abd wu, was seen by psychiatry and dc home after they felt him stable for dc.  At this time the pt was also denying active SI for me.  After my prior visit the patient returned to the GI office and stated he was still in pain, they  informed him to represent to the emergency department. He told the ambulance he wanted to go to [place] but they were on deferral.  I asked the pt why he wanted to go to [facility] instead of back to where he was evaluated and he stated that we didn't do anything for him.  I reiterated the course of our visit and his negative workup.  On my repeat examination the patient denies any change in symptoms, states "absolutely nothing has changed".  He states he is here because his abd pain is still present and severe.  He also denies any change in his suicidality and states he does not want to kill himself at this time.  I again informed the pt that I would not provide him with narcotics here or with a prescription for the same unless there was a change in his symptoms.  I reviewed his record in the provider database and he has had multiple prescriptions for narcotics, however these have all been from the ED.  He became very upset.  I again offered him nonnarcotics, this time he accepted toradol.  This was provided.  DC home in stable condition.  I have reviewed all laboratory and imaging studies if ordered as above  1. Abdominal pain, chronic, right upper quadrant         Mirian MoMatthew Rima Blizzard, MD 07/14/14 [phone]

## 2014-07-14 NOTE — Patient Instructions (Addendum)
MRI with MRCP images to check for retained gallstones. You have been scheduled for an MRI at [facility] on 07-27-14. Your appointment time is 3pm. Please arrive 15 minutes prior to your appointment time for registration purposes. Please make certain not to have anything to eat or drink 6 hours prior to your test. In addition, if you have any metal in your body, have a pacemaker or defibrillator, please be sure to let your ordering physician know. This test typically takes 45 minutes to 1 hour to complete. If the MRI is 'negative' then you may need EGD. Prescription for zofran to prevent nausea (new script written). NSAID type medicine (advil, ibuprofen) can cause ulcers, stomach upset.  Try to avoid these if possible. Tylenol is safer for your stomach. You must take omeprazole every day (OTC, one pill once daily). This helps prevent ulcers from NSAID medicines.

## 2014-07-18 ENCOUNTER — Ambulatory Visit: Payer: Self-pay | Admitting: Internal Medicine

## 2014-07-20 ENCOUNTER — Ambulatory Visit: Payer: Self-pay | Attending: Internal Medicine | Admitting: Internal Medicine

## 2014-07-20 ENCOUNTER — Encounter: Payer: Self-pay | Admitting: Internal Medicine

## 2014-07-20 VITALS — BP 150/96 | HR 88 | Temp 98.0°F | Resp 16 | Ht 66.0 in | Wt 135.0 lb

## 2014-07-20 DIAGNOSIS — M79604 Pain in right leg: Secondary | ICD-10-CM

## 2014-07-20 DIAGNOSIS — B9681 Helicobacter pylori [H. pylori] as the cause of diseases classified elsewhere: Secondary | ICD-10-CM

## 2014-07-20 DIAGNOSIS — A048 Other specified bacterial intestinal infections: Secondary | ICD-10-CM

## 2014-07-20 MED ORDER — CLARITHROMYCIN 500 MG PO TABS: 500.0000 mg | ORAL_TABLET | Freq: Two times a day (BID) | ORAL | Status: DC

## 2014-07-20 MED ORDER — AMOXICILLIN 500 MG PO CAPS: 500.0000 mg | ORAL_CAPSULE | Freq: Two times a day (BID) | ORAL | Status: DC

## 2014-07-20 NOTE — Progress Notes (Signed)
Pt is here c/o a burning pain in his right leg that started last week. Pt just found out that he tested positive for H. Pylori and needs medications.

## 2014-07-20 NOTE — Progress Notes (Signed)
Patient ID: Levi Palmer, male   DOB: 1986/02/02, 29 y.o.   MRN: [id number]  CC: abdominal pain  HPI: Levi Palmer is a 29 y.o. male here today for a follow up visit.  Patient has past medical history of chronic abdominal pain. Patient was found to have H. Pylori on his last visit but never received a call due to phone being disconnected.  He reports that he went to GI since our last visit and states that they only gave him protonix and zofran.  He states that he has continued pain with nausea and vomiting.    Last week he reports that he has had right leg burning.  He does not remember any injury.  He states that he is having more pain over the past few days that have caused him to walk slower.     Allergies  Allergen Reactions  . Tramadol Other (See Comments)    Jittery   . Zoloft [Sertraline Hcl] Palpitations   Past Medical History  Diagnosis Date  . Medical history non-contributory   . Anxiety   . Panic attack as reaction to stress   . Anemia   . Depression   . Gallstones    Current Outpatient Prescriptions on File Prior to Visit  Medication Sig Dispense Refill  . omeprazole (PRILOSEC) 20 MG capsule Take 1 capsule (20 mg total) by mouth 2 (two) times daily before a meal. (Patient taking differently: Take 20 mg by mouth as needed. ) 28 capsule 0  . ondansetron (ZOFRAN) 4 MG tablet Take 1 tablet (4 mg total) by mouth 2 (two) times daily. 60 tablet 6  . pantoprazole (PROTONIX) 40 MG tablet Take 1 tablet (40 mg total) by mouth daily. 30 tablet 0  . HYDROcodone-acetaminophen (NORCO/VICODIN) 5-325 MG per tablet Take 1-2 tablets by mouth every 6 (six) hours as needed for moderate pain. (Patient not taking: Reported on 07/20/2014) 15 tablet 0   No current facility-administered medications on file prior to visit.   Family History  Problem Relation Age of Onset  . Irritable bowel syndrome Mother   . Ulcerative colitis Maternal Grandfather   . Colon cancer Neg Hx   . Colon  polyps Neg Hx   . Kidney disease Mother   . Gallbladder disease Neg Hx   . Esophageal cancer Neg Hx   . Diabetes Neg Hx    History   Social History  . Marital Status: Single    Spouse Name: N/A    Number of Children: 0  . Years of Education: N/A   Occupational History  . Homeless    Social History Main Topics  . Smoking status: Never Smoker   . Smokeless tobacco: Never Used  . Alcohol Use: No  . Drug Use: Yes    Special: Marijuana     Comment: occasionally  . Sexual Activity: Not on file   Other Topics Concern  . Not on file   Social History Narrative    Review of Systems  Constitutional: Negative for fever and chills.  Gastrointestinal: Positive for nausea, vomiting and abdominal pain. Negative for heartburn, diarrhea and constipation.  All other systems reviewed and are negative.     Objective:   Filed Vitals:   07/20/14 1716  BP: 150/96  Pulse: 88  Temp: 98 F (36.7 C)  Resp: 16    Physical Exam: Constitutional: Patient appears well-developed and well-nourished. No distress. HENT: Normocephalic, atraumatic, External right and left ear normal. Oropharynx is clear and moist.  Eyes:  Conjunctivae and EOM are normal. PERRLA, no scleral icterus. Neck: Normal ROM. Neck supple. No JVD. No tracheal deviation. No thyromegaly. CVS: RRR, S1/S2 +, no murmurs, no gallops, no carotid bruit.  Pulmonary: Effort and breath sounds normal, no stridor, rhonchi, wheezes, rales.  Abdominal: Soft. BS +,  no distension, tenderness, rebound or guarding.  Musculoskeletal: Normal range of motion. No edema and no tenderness.  Lymphadenopathy: No lymphadenopathy noted, cervical, inguinal or axillary Neuro: Alert. Normal reflexes, muscle tone coordination. No cranial nerve deficit. Skin: Skin is warm and dry. No rash noted. Not diaphoretic. No erythema. No pallor. Psychiatric: Normal mood and affect. Behavior, judgment, thought content normal.  Lab Results  Component Value Date    WBC 11.9* 07/14/2014   HGB 13.2 07/14/2014   HCT 42.0 07/14/2014   MCV 74.2* 07/14/2014   PLT 347 07/14/2014   Lab Results  Component Value Date   CREATININE 0.96 07/14/2014   BUN 18 07/14/2014   NA 141 07/14/2014   K 3.4* 07/14/2014   CL 105 07/14/2014   CO2 26 07/14/2014    No results found for: HGBA1C Lipid Panel     Component Value Date/Time   CHOL 142 08/19/2013 1152   TRIG 68 08/19/2013 1152   HDL 47 08/19/2013 1152   CHOLHDL 3.0 08/19/2013 1152   VLDL 14 08/19/2013 1152   LDLCALC 81 08/19/2013 1152       Assessment and plan:   Levi Palmer was seen today for follow-up.  Diagnoses and associated orders for this visit:  H. pylori infection - clarithromycin (BIAXIN) 500 MG tablet; Take 1 tablet (500 mg total) by mouth 2 (two) times daily. - amoxicillin (AMOXIL) 500 MG capsule; Take 1 capsule (500 mg total) by mouth 2 (two) times daily. Continue protonix once daily Stressed the importance of completing all medications in order to eradicate the infection.  Right leg pain Patient will avoid NSAID's during this time. May take occasional tylenol for pain. Will reassess on f/u  Return in about 2 weeks (around 08/03/2014) for h.pylori.      Holland Commons, NP-C [name] [phone] 07/20/2014, 5:45 PM

## 2014-07-20 NOTE — Patient Instructions (Signed)
Helicobacter Pylori Antibodies Test °This is a blood test which looks for a germ called Helicobacter pylori. This can also be diagnosed by a breath test or a microscopic examination of a portion (biopsy) of the small bowel. H. pylori is a germ that is found in the cells that line the stomach. It is a risk factor for stomach and small bowel ulcers, long-standing inflammation of the lining of the stomach, or even ulcers that may occur in the esophagus (the canal that runs from the mouth to the stomach). This bacterium is also a factor in stomach cancer. The amount of the bacteria is found in about 10% of healthy persons younger than 28 years of age and the amount of the bacteria increases with age. Most persons with these bacteria have no symptoms; however, it is thought that when these bacteria cause ulcers, antibiotic medications can be used to help eliminate or reduce the problem.  °PREPARATION FOR TEST °No preparation or fasting is necessary. °NORMAL FINDINGS °Negative (H. pylori bacteria not present). °Ranges for normal findings may vary among different laboratories and hospitals. You should always check with your doctor after having lab work or other tests done to discuss the meaning of your test results and whether or not your values are considered within normal limits. °MEANING OF TEST  °Your caregiver will go over the test results with you and discuss the importance and meaning of your results, as well as treatment options and the need for additional tests if necessary. °OBTAINING THE TEST RESULTS °It is your responsibility to obtain your test results. Ask the lab or department performing the test when and how you will get your results. °Document Released: 07/17/2004 Document Revised: 11/07/2013 Document Reviewed: 06/03/2008 °Exit[employer]® Patient Information ©2015 [employer]. This information is not intended to replace advice given to you by your health care provider. Make sure you discuss any questions you  have with your health care provider. ° °

## 2014-07-24 ENCOUNTER — Ambulatory Visit: Payer: Self-pay | Admitting: Internal Medicine

## 2014-07-24 ENCOUNTER — Telehealth: Payer: Self-pay | Admitting: Internal Medicine

## 2014-07-24 NOTE — Telephone Encounter (Signed)
Pt requesting change in nausea medication, states that the current medication is not effective. Please f/u with pt.

## 2014-07-24 NOTE — Telephone Encounter (Signed)
Pt requesting medication Change. Rx Zofran

## 2014-07-24 NOTE — Telephone Encounter (Signed)
Pt requesting change in nausea medication, states that the current medication is not effective. Please f/u with    Can we switch?

## 2014-07-25 ENCOUNTER — Ambulatory Visit: Payer: Self-pay | Admitting: Internal Medicine

## 2014-07-25 MED ORDER — PROMETHAZINE HCL 25 MG PO TABS: 25.0000 mg | ORAL_TABLET | Freq: Three times a day (TID) | ORAL | Status: DC | PRN

## 2014-07-25 NOTE — Addendum Note (Signed)
Addended by: Dyann KiefGIRALDEZ, Charne Mcbrien M on: 07/25/2014 12:00 PM   Modules accepted: Orders

## 2014-07-25 NOTE — Telephone Encounter (Signed)
Pt aware, Rx e-scribe to [facility]

## 2014-07-25 NOTE — Telephone Encounter (Signed)
May switch to phenergan 25 mg q8hrs prn. 30 tablets, no refill

## 2014-07-27 ENCOUNTER — Emergency Department (HOSPITAL_COMMUNITY)
Admission: EM | Admit: 2014-07-27 | Discharge: 2014-07-27 | Disposition: A | Discharge status: Home / Self Care | Payer: No Typology Code available for payment source | Attending: Emergency Medicine | Admitting: Emergency Medicine

## 2014-07-27 ENCOUNTER — Ambulatory Visit (HOSPITAL_COMMUNITY): Payer: No Typology Code available for payment source

## 2014-07-27 ENCOUNTER — Encounter (HOSPITAL_COMMUNITY): Payer: Self-pay | Admitting: Emergency Medicine

## 2014-07-27 ENCOUNTER — Telehealth: Payer: Self-pay | Admitting: Gastroenterology

## 2014-07-27 DIAGNOSIS — Z79899 Other long term (current) drug therapy: Secondary | ICD-10-CM | POA: Insufficient documentation

## 2014-07-27 DIAGNOSIS — Z9049 Acquired absence of other specified parts of digestive tract: Secondary | ICD-10-CM | POA: Insufficient documentation

## 2014-07-27 DIAGNOSIS — Z862 Personal history of diseases of the blood and blood-forming organs and certain disorders involving the immune mechanism: Secondary | ICD-10-CM | POA: Insufficient documentation

## 2014-07-27 DIAGNOSIS — F329 Major depressive disorder, single episode, unspecified: Secondary | ICD-10-CM | POA: Insufficient documentation

## 2014-07-27 DIAGNOSIS — R000 Tachycardia, unspecified: Secondary | ICD-10-CM | POA: Insufficient documentation

## 2014-07-27 DIAGNOSIS — R197 Diarrhea, unspecified: Secondary | ICD-10-CM | POA: Insufficient documentation

## 2014-07-27 DIAGNOSIS — R1012 Left upper quadrant pain: Secondary | ICD-10-CM | POA: Insufficient documentation

## 2014-07-27 DIAGNOSIS — Z8719 Personal history of other diseases of the digestive system: Secondary | ICD-10-CM | POA: Insufficient documentation

## 2014-07-27 DIAGNOSIS — R112 Nausea with vomiting, unspecified: Secondary | ICD-10-CM | POA: Insufficient documentation

## 2014-07-27 LAB — CBC WITH DIFFERENTIAL/PLATELET
BASOS ABS: 0 10*3/uL (ref 0.0–0.1)
BASOS PCT: 0 % (ref 0–1)
EOS ABS: 0.1 10*3/uL (ref 0.0–0.7)
Eosinophils Relative: 1 % (ref 0–5)
HCT: 36 % — ABNORMAL LOW (ref 39.0–52.0)
HEMOGLOBIN: 11.6 g/dL — AB (ref 13.0–17.0)
Lymphocytes Relative: 14 % (ref 12–46)
Lymphs Abs: 1.5 10*3/uL (ref 0.7–4.0)
MCH: 23.3 pg — AB (ref 26.0–34.0)
MCHC: 32.2 g/dL (ref 30.0–36.0)
MCV: 72.3 fL — AB (ref 78.0–100.0)
MONO ABS: 0.5 10*3/uL (ref 0.1–1.0)
Monocytes Relative: 5 % (ref 3–12)
Neutro Abs: 8.3 10*3/uL — ABNORMAL HIGH (ref 1.7–7.7)
Neutrophils Relative %: 80 % — ABNORMAL HIGH (ref 43–77)
Platelets: 312 10*3/uL (ref 150–400)
RBC: 4.98 MIL/uL (ref 4.22–5.81)
RDW: 16.6 % — AB (ref 11.5–15.5)
WBC: 10.4 10*3/uL (ref 4.0–10.5)

## 2014-07-27 LAB — URINALYSIS, ROUTINE W REFLEX MICROSCOPIC
Bilirubin Urine: NEGATIVE
Glucose, UA: NEGATIVE mg/dL
Hgb urine dipstick: NEGATIVE
Ketones, ur: 40 mg/dL — AB
LEUKOCYTES UA: NEGATIVE
Nitrite: NEGATIVE
Protein, ur: NEGATIVE mg/dL
Specific Gravity, Urine: >1.03 — ABNORMAL HIGH (ref 1.005–1.030)
Urobilinogen, UA: 1 mg/dL (ref 0.0–1.0)
pH: 6.5 (ref 5.0–8.0)

## 2014-07-27 LAB — COMPREHENSIVE METABOLIC PANEL
ALT: 11 U/L (ref 0–53)
AST: 16 U/L (ref 0–37)
Albumin: 4.2 g/dL (ref 3.5–5.2)
Alkaline Phosphatase: 71 U/L (ref 39–117)
Anion gap: 10 (ref 5–15)
BILIRUBIN TOTAL: 0.5 mg/dL (ref 0.3–1.2)
BUN: 18 mg/dL (ref 6–23)
CO2: 26 mmol/L (ref 19–32)
Calcium: 9.5 mg/dL (ref 8.4–10.5)
Chloride: 104 mEq/L (ref 96–112)
Creatinine, Ser: 1.02 mg/dL (ref 0.50–1.35)
GFR calc Af Amer: >90 mL/min (ref >90)
GFR calc non Af Amer: >90 mL/min (ref >90)
GLUCOSE: 105 mg/dL — AB (ref 70–99)
Potassium: 3.6 mmol/L (ref 3.5–5.1)
Sodium: 140 mmol/L (ref 135–145)
TOTAL PROTEIN: 7.8 g/dL (ref 6.0–8.3)

## 2014-07-27 LAB — LIPASE, BLOOD: Lipase: 24 U/L (ref 11–59)

## 2014-07-27 MED ORDER — ONDANSETRON HCL 4 MG/2ML IJ SOLN
4.0000 mg | Freq: Once | INTRAMUSCULAR | Status: AC
  Administered 2014-07-27: 4 mg via INTRAVENOUS
  Filled 2014-07-27: qty 2

## 2014-07-27 MED ORDER — SODIUM CHLORIDE 0.9 % IV BOLUS (SEPSIS)
1000.0000 mL | Freq: Once | INTRAVENOUS | Status: AC
  Administered 2014-07-27: 1000 mL via INTRAVENOUS

## 2014-07-27 MED ORDER — PROMETHAZINE HCL 25 MG/ML IJ SOLN
25.0000 mg | Freq: Once | INTRAMUSCULAR | Status: AC
  Administered 2014-07-27: 25 mg via INTRAVENOUS
  Filled 2014-07-27: qty 1

## 2014-07-27 MED ORDER — FENTANYL CITRATE 0.05 MG/ML IJ SOLN
100.0000 ug | Freq: Once | INTRAMUSCULAR | Status: AC
  Administered 2014-07-27: 100 ug via INTRAVENOUS
  Filled 2014-07-27: qty 2

## 2014-07-27 MED ORDER — GI COCKTAIL ~~LOC~~
30.0000 mL | Freq: Once | ORAL | Status: AC
  Administered 2014-07-27: 30 mL via ORAL
  Filled 2014-07-27: qty 30

## 2014-07-27 NOTE — ED Provider Notes (Signed)
CSN: [id number]     Arrival date & time 07/27/14  0715 History   First MD Initiated Contact with Patient 07/27/14 [phone]     Chief Complaint  Patient presents with  . Abdominal Pain  . Nausea  . Emesis     (Consider location/radiation/quality/duration/timing/severity/associated sxs/prior Treatment) HPI Comments: Patient with multiple emergency department visits for pain-related complaints as well as nausea and vomiting, previous cholecystectomy, positive H. pylori test 06/2014, currently undergoing treatment with amoxicillin, clarithromycin, Protonix, Zofran, followed by GI and MRCP scheduled for today -- presents with complaint of nausea, vomiting starting yesterday and has been persistent despite Zofran and Phenergan. No blood in emesis. Patient has had runny stool but no blood. He has left lateral abdominal pain radiates to his groin. No back pain. No fever or URI symptoms. No dysuria, hematuria. The onset of this condition was acute. The course is constant. Aggravating factors: none. Alleviating factors: none.    Patient is a 29 y.o. male presenting with abdominal pain and vomiting. The history is provided by the patient and medical records.  Abdominal Pain Associated symptoms: diarrhea, nausea and vomiting   Associated symptoms: no chest pain, no cough, no dysuria, no fever and no sore throat   Emesis Associated symptoms: abdominal pain and diarrhea   Associated symptoms: no headaches, no myalgias and no sore throat     Past Medical History  Diagnosis Date  . Medical history non-contributory   . Anxiety   . Panic attack as reaction to stress   . Anemia   . Depression   . Gallstones    Past Surgical History  Procedure Laterality Date  . Cholecystectomy    . Tonsillectomy    . Incise and drain abcess    . Mouth surgery     Family History  Problem Relation Age of Onset  . Irritable bowel syndrome Mother   . Ulcerative colitis Maternal Grandfather   . Colon cancer Neg Hx     . Colon polyps Neg Hx   . Kidney disease Mother   . Gallbladder disease Neg Hx   . Esophageal cancer Neg Hx   . Diabetes Neg Hx    History  Substance Use Topics  . Smoking status: Never Smoker   . Smokeless tobacco: Never Used  . Alcohol Use: No    Review of Systems  Constitutional: Negative for fever.  HENT: Negative for rhinorrhea and sore throat.   Eyes: Negative for redness.  Respiratory: Negative for cough.   Cardiovascular: Negative for chest pain.  Gastrointestinal: Positive for nausea, vomiting, abdominal pain and diarrhea. Negative for blood in stool.  Genitourinary: Negative for dysuria.  Musculoskeletal: Negative for myalgias.  Skin: Negative for rash.  Neurological: Negative for headaches.      Allergies  Tramadol and Zoloft  Home Medications   Prior to Admission medications   Medication Sig Start Date End Date Taking? Authorizing Provider  amoxicillin (AMOXIL) 500 MG capsule Take 1 capsule (500 mg total) by mouth 2 (two) times daily. 07/20/14   Ambrose Finland, NP  Brexpiprazole 1 MG TABS Take 1 mg by mouth daily.    Historical Provider, MD  clarithromycin (BIAXIN) 500 MG tablet Take 1 tablet (500 mg total) by mouth 2 (two) times daily. 07/20/14   Ambrose Finland, NP  omeprazole (PRILOSEC) 20 MG capsule Take 1 capsule (20 mg total) by mouth 2 (two) times daily before a meal. Patient taking differently: Take 20 mg by mouth as needed.  06/11/14  Ambrose FinlandValerie A Keck, NP  ondansetron (ZOFRAN) 4 MG tablet Take 1 tablet (4 mg total) by mouth 2 (two) times daily. 07/14/14   Rachael Feeaniel P Jacobs, MD  pantoprazole (PROTONIX) 40 MG tablet Take 1 tablet (40 mg total) by mouth daily. 07/12/14   Suzi RootsKevin E Steinl, MD  promethazine (PHENERGAN) 25 MG tablet Take 1 tablet (25 mg total) by mouth every 8 (eight) hours as needed for nausea or vomiting. 07/25/14   Ambrose FinlandValerie A Keck, NP   BP 132/96 mmHg  Pulse 111  Temp(Src) 98.8 F (37.1 C) (Oral)  Resp 23  SpO2 99%   Physical Exam   Constitutional: He appears well-developed and well-nourished.  HENT:  Head: Normocephalic and atraumatic.  Eyes: Conjunctivae are normal. Right eye exhibits no discharge. Left eye exhibits no discharge.  Neck: Normal range of motion. Neck supple.  Cardiovascular: Regular rhythm and normal heart sounds.  Tachycardia present.   Pulmonary/Chest: Effort normal and breath sounds normal.  Abdominal: Soft. Bowel sounds are normal. There is tenderness. There is no rebound and no guarding.    Neurological: He is alert.  Skin: Skin is warm and dry.  Psychiatric: He has a normal mood and affect.  Nursing note and vitals reviewed.   ED Course  Procedures (including critical care time) Labs Review Labs Reviewed  CBC WITH DIFFERENTIAL - Abnormal; Notable for the following:    Hemoglobin 11.6 (*)    HCT 36.0 (*)    MCV 72.3 (*)    MCH 23.3 (*)    RDW 16.6 (*)    Neutrophils Relative % 80 (*)    Neutro Abs 8.3 (*)    All other components within normal limits  COMPREHENSIVE METABOLIC PANEL - Abnormal; Notable for the following:    Glucose, Bld 105 (*)    All other components within normal limits  URINALYSIS, ROUTINE W REFLEX MICROSCOPIC - Abnormal; Notable for the following:    APPearance HAZY (*)    Specific Gravity, Urine >1.030 (*)    Ketones, ur 40 (*)    All other components within normal limits  LIPASE, BLOOD    Imaging Review No results found.   EKG Interpretation None       7:59 AM Patient seen and examined. Previous records reviewed. Work-up initiated. Medications ordered.   Vital signs reviewed and are as follows: BP 132/96 mmHg  Pulse 111  Temp(Src) 98.8 F (37.1 C) (Oral)  Resp 23  SpO2 99%   10:56 AM Pt informed of results. States feeling a bit better with phenergan. He is drinking. Pending completion of 2nd fluid bolus.   11:31 AM fluids complete. Patient is feeling better, no further vomiting. Will discharge to home. Encouraged him to go to his MRCP today  at 3 PM as planned.  The patient was urged to return to the Emergency Department immediately with worsening of current symptoms, worsening abdominal pain, persistent vomiting, blood noted in stools, fever, or any other concerns. The patient verbalized understanding.   Patient has antiemetics at home.  MDM   Final diagnoses:  Nausea vomiting and diarrhea  Left upper quadrant pain   Patient with persistent nausea, vomiting, and diarrhea currently being followed by GI. He has positive H. pylori and is currently undergoing treatment for this. GI workup to include MRCP and possible EGD. His lab work is unremarkable in emergency department. Dehydration treated with 2 L IV fluids. Nausea improved with antiemetics. Patient appears well, nontoxic, ready for discharge.  No dangerous or life-threatening conditions suspected  or identified by history, physical exam, and by work-up. No indications for hospitalization identified.      Renne Crigler, PA-C 07/27/14 1134  Lyanne Co, MD 07/28/14 1710

## 2014-07-27 NOTE — Discharge Instructions (Signed)
Please read and follow all provided instructions.  Your diagnoses today include:  1. Nausea vomiting and diarrhea   2. Left upper quadrant pain     Tests performed today include:  Blood counts and electrolytes  Blood tests to check liver and kidney function  Blood tests to check pancreas function  Urine test to look for infection - shows mild dehydration  Vital signs. See below for your results today.   Medications prescribed:   None  Take any prescribed medications only as directed.  Home care instructions:   Follow any educational materials contained in this packet.  Follow-up instructions: Please follow-up with your primary care provider in the next 2 days for further evaluation of your symptoms.    Return instructions:  SEEK IMMEDIATE MEDICAL ATTENTION IF:  The pain does not go away or becomes severe   A temperature above 101F develops   Repeated vomiting occurs (multiple episodes)   The pain becomes localized to portions of the abdomen. The right side could possibly be appendicitis. In an adult, the left lower portion of the abdomen could be colitis or diverticulitis.   Blood is being passed in stools or vomit (bright red or black tarry stools)   You develop chest pain, difficulty breathing, dizziness or fainting, or become confused, poorly responsive, or inconsolable (young children)  If you have any other emergent concerns regarding your health  Additional Information: Abdominal (belly) pain can be caused by many things. Your caregiver performed an examination and possibly ordered blood/urine tests and imaging (CT scan, x-rays, ultrasound). Many cases can be observed and treated at home after initial evaluation in the emergency department. Even though you are being discharged home, abdominal pain can be unpredictable. Therefore, you need a repeated exam if your pain does not resolve, returns, or worsens. Most patients with abdominal pain don't have to be  admitted to the hospital or have surgery, but serious problems like appendicitis and gallbladder attacks can start out as nonspecific pain. Many abdominal conditions cannot be diagnosed in one visit, so follow-up evaluations are very important.  Your vital signs today were: BP 116/77 mmHg   Pulse 106   Temp(Src) 98.8 F (37.1 C) (Oral)   Resp 25   SpO2 98% If your blood pressure (bp) was elevated above 135/85 this visit, please have this repeated by your doctor within one month. --------------

## 2014-07-27 NOTE — ED Notes (Addendum)
Pt. Was unable to tolerate the fluids, pt. Stated, "I feel nauseated" Reported to Rhea BleacherJosh Geiple, [place]

## 2014-07-27 NOTE — Telephone Encounter (Signed)
Patient states he is so nauseous he cannot sit still for his MRI. I asked patient if he had Zofran or phenergan still he can take prior to his MRI. Patient states he does but the Zofran does not help and phenergan quit helping him yesterday. Patient also states he has severe anxiety. I told patient we cannot give him anything for his anxiety. Patient states "thats not what I said". I told patient to take either Zofran or phenergan before his MRI. Patient states we need to reschedule it because he has missed his bus now waiting on us to call him back. I asked him could he not attempt to go today to the MRI. Patient states " why is everyone tripping when I talk to them, I can't sit still long enough." I told patient that if he cannot sit still today then if I reschedule the MRI, will he go to that appointment. Patient states he will go if I reschedule it. Rescheduled MRI for 08/07/14 at 4:00pm

## 2014-07-27 NOTE — ED Notes (Signed)
Pt here today with c/o left lower quadrant pain that radiates down to his groin , pt is also c/o n/v.

## 2014-07-27 NOTE — ED Notes (Signed)
pts vitals updated pts iv removed. Pt getting dressed and awaiting discharge paperwork at bedside.

## 2014-07-30 NOTE — Telephone Encounter (Signed)
Please offer higher strength zofran (8mg  pills) disp 60, take on twice daily as needed for nausea, refill 2.  Thanks

## 2014-07-31 MED ORDER — ONDANSETRON HCL 8 MG PO TABS: 8.0000 mg | ORAL_TABLET | Freq: Two times a day (BID) | ORAL | Status: DC

## 2014-08-03 ENCOUNTER — Encounter: Payer: Self-pay | Admitting: Internal Medicine

## 2014-08-03 ENCOUNTER — Ambulatory Visit: Payer: Self-pay | Attending: Internal Medicine | Admitting: Internal Medicine

## 2014-08-03 VITALS — BP 121/83 | HR 75 | Temp 97.6°F | Resp 16 | Ht 66.75 in | Wt 143.0 lb

## 2014-08-03 DIAGNOSIS — D649 Anemia, unspecified: Secondary | ICD-10-CM | POA: Insufficient documentation

## 2014-08-03 DIAGNOSIS — F419 Anxiety disorder, unspecified: Secondary | ICD-10-CM | POA: Insufficient documentation

## 2014-08-03 DIAGNOSIS — F329 Major depressive disorder, single episode, unspecified: Secondary | ICD-10-CM | POA: Insufficient documentation

## 2014-08-03 DIAGNOSIS — Z888 Allergy status to other drugs, medicaments and biological substances status: Secondary | ICD-10-CM | POA: Insufficient documentation

## 2014-08-03 DIAGNOSIS — R109 Unspecified abdominal pain: Secondary | ICD-10-CM | POA: Insufficient documentation

## 2014-08-03 DIAGNOSIS — M79604 Pain in right leg: Secondary | ICD-10-CM | POA: Insufficient documentation

## 2014-08-03 DIAGNOSIS — M79601 Pain in right arm: Secondary | ICD-10-CM

## 2014-08-03 DIAGNOSIS — Z79899 Other long term (current) drug therapy: Secondary | ICD-10-CM | POA: Insufficient documentation

## 2014-08-03 DIAGNOSIS — G8929 Other chronic pain: Secondary | ICD-10-CM | POA: Insufficient documentation

## 2014-08-03 MED ORDER — DICLOFENAC SODIUM 1 % TD GEL
2.0000 g | Freq: Four times a day (QID) | TRANSDERMAL | Status: DC
Start: 2014-08-03 — End: 2015-02-07

## 2014-08-03 NOTE — Progress Notes (Signed)
Patient ID: Levi Palmer, male   DOB: 1985-12-29, 29 y.o.   MRN: [id number]  CC: h.pylori f/u, leg pain  HPI: Levi Palmer is a 29 y.o. male here today for a follow up visit.  Patient has past medical history of depression, anxiety, and chronic abdominal pain.  He reports that he has completed the full therapy for h.pylori and now feels much better.  He states that he continues to have nausea but the gas and bloating feeling have resolved.  He is currently getting adjusted to being able to eat again since he went so long with decreased appetite. He reports a constant right upper anterior thigh pain for the past three weeks that is becoming more painful with ambulation. The pain is still described as a pinching sensation.He denies any injury, bug bites, or trauma. He denies redness, swelling, or broken skin. He has not tried taking any medication for pain because he was afraid he would have a interaction with the antibiotics.   Patient has No headache, No chest pain, No abdominal pain - No Nausea, No new weakness tingling or numbness, No Cough - SOB.  Allergies  Allergen Reactions  . Tramadol Other (See Comments)    Jittery   . Zoloft [Sertraline Hcl] Palpitations   Past Medical History  Diagnosis Date  . Medical history non-contributory   . Anxiety   . Panic attack as reaction to stress   . Anemia   . Depression   . Gallstones    Current Outpatient Prescriptions on File Prior to Visit  Medication Sig Dispense Refill  . amoxicillin (AMOXIL) 500 MG capsule Take 1 capsule (500 mg total) by mouth 2 (two) times daily. 28 capsule 0  . clarithromycin (BIAXIN) 500 MG tablet Take 1 tablet (500 mg total) by mouth 2 (two) times daily. 28 tablet 0  . omeprazole (PRILOSEC) 20 MG capsule Take 1 capsule (20 mg total) by mouth 2 (two) times daily before a meal. 28 capsule 0  . pantoprazole (PROTONIX) 40 MG tablet Take 1 tablet (40 mg total) by mouth daily. 30 tablet 0  . promethazine  (PHENERGAN) 25 MG tablet Take 1 tablet (25 mg total) by mouth every 8 (eight) hours as needed for nausea or vomiting. 30 tablet 0  . ondansetron (ZOFRAN) 8 MG tablet Take 1 tablet (8 mg total) by mouth 2 (two) times daily. (Patient not taking: Reported on 08/03/2014) 60 tablet 2   No current facility-administered medications on file prior to visit.   Family History  Problem Relation Age of Onset  . Irritable bowel syndrome Mother   . Ulcerative colitis Maternal Grandfather   . Colon cancer Neg Hx   . Colon polyps Neg Hx   . Kidney disease Mother   . Gallbladder disease Neg Hx   . Esophageal cancer Neg Hx   . Diabetes Neg Hx    History   Social History  . Marital Status: Single    Spouse Name: N/A    Number of Children: 0  . Years of Education: N/A   Occupational History  . Homeless    Social History Main Topics  . Smoking status: Never Smoker   . Smokeless tobacco: Never Used  . Alcohol Use: No  . Drug Use: Yes    Special: Marijuana     Comment: occasionally  . Sexual Activity: Not on file   Other Topics Concern  . Not on file   Social History Narrative    Review of Systems: See history  of present illness  Objective:   Filed Vitals:   08/03/14 1648  BP: 121/83  Pulse: 75  Temp: 97.6 F (36.4 C)  Resp: 16    Physical Exam  Constitutional: He is oriented to person, place, and time.  Cardiovascular: Normal rate, regular rhythm and normal heart sounds.   Pulmonary/Chest: Effort normal and breath sounds normal.  Musculoskeletal: Normal range of motion. He exhibits no tenderness (non TTP on right thigh. no open wounds or scarring).  Neurological: He is alert and oriented to person, place, and time.     Lab Results  Component Value Date   WBC 10.4 07/27/2014   HGB 11.6* 07/27/2014   HCT 36.0* 07/27/2014   MCV 72.3* 07/27/2014   PLT 312 07/27/2014   Lab Results  Component Value Date   CREATININE 1.02 07/27/2014   BUN 18 07/27/2014   NA 140  07/27/2014   K 3.6 07/27/2014   CL 104 07/27/2014   CO2 26 07/27/2014    No results found for: HGBA1C Lipid Panel     Component Value Date/Time   CHOL 142 08/19/2013 1152   TRIG 68 08/19/2013 1152   HDL 47 08/19/2013 1152   CHOLHDL 3.0 08/19/2013 1152   VLDL 14 08/19/2013 1152   LDLCALC 81 08/19/2013 1152       Assessment and plan:   Levi NeedleMichael was seen today for follow-up.  Diagnoses and all orders for this visit:  Leg pain, anterior, right Orders: -     diclofenac sodium (VOLTAREN) 1 % GEL; Apply 2 g topically 4 (four) times daily. At this point I believe the patient is drug seeking. He has been seen several times for multiple complaints that all have no etiology of pain source. I have explained to patient that I will not prescribe him pain medication but I would be happy to give him tylenol or voltaren gel. I will not give oral voltaren due to history of abdominal pain.   Return if symptoms worsen or fail to improve.       Holland CommonsKECK, Tristin Vandeusen, NP-C [facility] [phone] 08/03/2014, 5:02 PM

## 2014-08-03 NOTE — Progress Notes (Signed)
Pt is here following up on his diagnosis and treatment of his  h pylori. Pt states that his neck and leg has been painful for about month.

## 2014-08-04 ENCOUNTER — Emergency Department (HOSPITAL_COMMUNITY): Payer: No Typology Code available for payment source

## 2014-08-04 ENCOUNTER — Emergency Department (HOSPITAL_COMMUNITY)
Admission: EM | Admit: 2014-08-04 | Discharge: 2014-08-04 | Disposition: A | Discharge status: Home / Self Care | Payer: No Typology Code available for payment source | Attending: Emergency Medicine | Admitting: Emergency Medicine

## 2014-08-04 ENCOUNTER — Encounter (HOSPITAL_COMMUNITY): Payer: Self-pay | Admitting: Physical Medicine and Rehabilitation

## 2014-08-04 DIAGNOSIS — M25511 Pain in right shoulder: Secondary | ICD-10-CM

## 2014-08-04 DIAGNOSIS — Y9389 Activity, other specified: Secondary | ICD-10-CM | POA: Insufficient documentation

## 2014-08-04 DIAGNOSIS — Z792 Long term (current) use of antibiotics: Secondary | ICD-10-CM | POA: Insufficient documentation

## 2014-08-04 DIAGNOSIS — Z791 Long term (current) use of non-steroidal anti-inflammatories (NSAID): Secondary | ICD-10-CM | POA: Insufficient documentation

## 2014-08-04 DIAGNOSIS — Z8719 Personal history of other diseases of the digestive system: Secondary | ICD-10-CM | POA: Insufficient documentation

## 2014-08-04 DIAGNOSIS — M79642 Pain in left hand: Secondary | ICD-10-CM

## 2014-08-04 DIAGNOSIS — Y998 Other external cause status: Secondary | ICD-10-CM | POA: Insufficient documentation

## 2014-08-04 DIAGNOSIS — Y9289 Other specified places as the place of occurrence of the external cause: Secondary | ICD-10-CM | POA: Insufficient documentation

## 2014-08-04 DIAGNOSIS — Z79899 Other long term (current) drug therapy: Secondary | ICD-10-CM | POA: Insufficient documentation

## 2014-08-04 DIAGNOSIS — W19XXXA Unspecified fall, initial encounter: Secondary | ICD-10-CM

## 2014-08-04 DIAGNOSIS — S4991XA Unspecified injury of right shoulder and upper arm, initial encounter: Secondary | ICD-10-CM | POA: Insufficient documentation

## 2014-08-04 DIAGNOSIS — Z862 Personal history of diseases of the blood and blood-forming organs and certain disorders involving the immune mechanism: Secondary | ICD-10-CM | POA: Insufficient documentation

## 2014-08-04 DIAGNOSIS — S0990XA Unspecified injury of head, initial encounter: Secondary | ICD-10-CM | POA: Insufficient documentation

## 2014-08-04 DIAGNOSIS — S6992XA Unspecified injury of left wrist, hand and finger(s), initial encounter: Secondary | ICD-10-CM | POA: Insufficient documentation

## 2014-08-04 DIAGNOSIS — W01198A Fall on same level from slipping, tripping and stumbling with subsequent striking against other object, initial encounter: Secondary | ICD-10-CM | POA: Insufficient documentation

## 2014-08-04 DIAGNOSIS — Z8659 Personal history of other mental and behavioral disorders: Secondary | ICD-10-CM | POA: Insufficient documentation

## 2014-08-04 NOTE — ED Notes (Signed)
Patient is alert but states he feels groggy.  He states he feels tired.  Patient is requesting something to drink

## 2014-08-04 NOTE — ED Notes (Signed)
Pt reports falling last night after tripping over a toy. Pt possible LOC after fall as he is unsure of how he got up off the floor and back to a standing position. Pt alert and oriented x4. Following commands. Speaking in clear sentences.

## 2014-08-04 NOTE — ED Provider Notes (Signed)
CSN: [id number]     Arrival date & time 08/04/14  0915 History   First MD Initiated Contact with Patient 08/04/14 [phone]     Chief Complaint  Patient presents with  . Head Injury     (Consider location/radiation/quality/duration/timing/severity/associated sxs/prior Treatment) Patient is a 29 y.o. male presenting with head injury. The history is provided by the patient and medical records.  Head Injury Associated symptoms: headache    This is a 29 year old male with past medical history significant for anxiety, anemia, depression, gallstones, presenting to the ED for fall with head injury.  Patient states last night he slipped on his son's toy in the shower and hit his head against the shower wall and again against the floor. He is unsure of loss of consciousness but states he woke up in the bathroom floor unsure of how he got there. He is not sure how long he was lying in the floor. Now he complains of generalized headache, lightheadedness/dizziness, and that his vision appears brighter than normal.  He also notes left index finger pain and right shoulder pain. He denies any numbness or paresthesias of his extremities. Denies tinnitus, confusion, blurred vision, changes in speech, or difficulty ambulating.  He is not currently on any type of anti-coagulation.  No intervention tried PTA.  Past Medical History  Diagnosis Date  . Medical history non-contributory   . Anxiety   . Panic attack as reaction to stress   . Anemia   . Depression   . Gallstones    Past Surgical History  Procedure Laterality Date  . Cholecystectomy    . Tonsillectomy    . Incise and drain abcess    . Mouth surgery     Family History  Problem Relation Age of Onset  . Irritable bowel syndrome Mother   . Ulcerative colitis Maternal Grandfather   . Colon cancer Neg Hx   . Colon polyps Neg Hx   . Kidney disease Mother   . Gallbladder disease Neg Hx   . Esophageal cancer Neg Hx   . Diabetes Neg Hx    History   Substance Use Topics  . Smoking status: Never Smoker   . Smokeless tobacco: Never Used  . Alcohol Use: No    Review of Systems  Musculoskeletal: Positive for arthralgias.  Neurological: Positive for light-headedness and headaches.  All other systems reviewed and are negative.     Allergies  Tramadol and Zoloft  Home Medications   Prior to Admission medications   Medication Sig Start Date End Date Taking? Authorizing Provider  amoxicillin (AMOXIL) 500 MG capsule Take 1 capsule (500 mg total) by mouth 2 (two) times daily. 07/20/14   Ambrose FinlandValerie A Keck, NP  clarithromycin (BIAXIN) 500 MG tablet Take 1 tablet (500 mg total) by mouth 2 (two) times daily. 07/20/14   Ambrose FinlandValerie A Keck, NP  diclofenac sodium (VOLTAREN) 1 % GEL Apply 2 g topically 4 (four) times daily. 08/03/14   Ambrose FinlandValerie A Keck, NP  omeprazole (PRILOSEC) 20 MG capsule Take 1 capsule (20 mg total) by mouth 2 (two) times daily before a meal. 06/11/14   Ambrose FinlandValerie A Keck, NP  ondansetron (ZOFRAN) 8 MG tablet Take 1 tablet (8 mg total) by mouth 2 (two) times daily. Patient not taking: Reported on 08/03/2014 07/31/14   Rachael Feeaniel P Jacobs, MD  pantoprazole (PROTONIX) 40 MG tablet Take 1 tablet (40 mg total) by mouth daily. 07/12/14   Suzi RootsKevin E Steinl, MD  promethazine (PHENERGAN) 25 MG tablet Take 1 tablet (  25 mg total) by mouth every 8 (eight) hours as needed for nausea or vomiting. 07/25/14   Ambrose Finland, NP   BP 122/77 mmHg  Pulse 100  Temp(Src) 98.2 F (36.8 C) (Oral)  Resp 18  SpO2 98%   Physical Exam  Constitutional: He is oriented to person, place, and time. He appears well-developed and well-nourished.  HENT:  Head: Normocephalic and atraumatic.  Mouth/Throat: Oropharynx is clear and moist.  Eyes: Conjunctivae and EOM are normal. Pupils are equal, round, and reactive to light.  Pupils dilated but reactive  Neck: Normal range of motion.  Cardiovascular: Normal rate, regular rhythm and normal heart sounds.   Pulmonary/Chest: Effort  normal and breath sounds normal. No respiratory distress. He has no wheezes.  Abdominal: Soft. Bowel sounds are normal.  Musculoskeletal: Normal range of motion.  Left index finger with some bruising along palmar aspect; no gross bony deformities; limited flexion due to pain, full extension; other fingers WNL Right shoulder with generalized pain but no focal tenderness; limited ROM in all directions due to pain; no bony deformities or signs of dislocation Strong radial pulses, grip strength, and normal sensation of BUE  Neurological: He is alert and oriented to person, place, and time.  AAOx3, answering questions appropriately; equal strength UE and LE bilaterally; CN grossly intact; moves all extremities appropriately without ataxia; no focal neuro deficits or facial asymmetry appreciated  Skin: Skin is warm and dry.  Psychiatric: He has a normal mood and affect.  Nursing note and vitals reviewed.   ED Course  Procedures (including critical care time) Labs Review Labs Reviewed - No data to display  Imaging Review Dg Shoulder Right  08/04/2014   CLINICAL DATA:  Pain post fall today, slipped in shower  EXAM: RIGHT SHOULDER - 2+ VIEW  COMPARISON:  None.  FINDINGS: Three views of the right shoulder submitted. No acute fracture or subluxation. No periosteal reaction or bony erosion.  IMPRESSION: Negative.   Electronically Signed   By: Natasha Mead M.D.   On: 08/04/2014 10:39   Ct Head Wo Contrast  08/04/2014   CLINICAL DATA:  Slipped and fell last evening.  Hit head.  EXAM: CT HEAD WITHOUT CONTRAST  TECHNIQUE: Contiguous axial images were obtained from the base of the skull through the vertex without intravenous contrast.  COMPARISON:  None.  FINDINGS: The ventricles are normal in size and configuration. No extra-axial fluid collections are identified. The gray-white differentiation is normal. No CT findings for acute intracranial process such as hemorrhage or infarction. No mass lesions. The  brainstem and cerebellum are grossly normal.  The bony structures are intact. The paranasal sinuses and mastoid air cells are clear. The globes are intact.  IMPRESSION: Normal head CT.   Electronically Signed   By: Loralie Champagne M.D.   On: 08/04/2014 10:51   Dg Finger Index Left  08/04/2014   CLINICAL DATA:  Left index finger pain post fall today  EXAM: LEFT INDEX FINGER 2+V  COMPARISON:  None.  FINDINGS: Three views of left index finger submitted. No acute fracture or subluxation. No radiopaque foreign body.  IMPRESSION: Negative.   Electronically Signed   By: Natasha Mead M.D.   On: 08/04/2014 10:38     EKG Interpretation None      MDM   Final diagnoses:  Fall  Head injury, initial encounter  Hand pain, left  Shoulder pain, right   29 year old male with slip and fall in shower with head injury and likely loss  of consciousness. Patient also notes left index finger pain as well as right shoulder pain. Both upper extremities remain neurovascularly intact. Patient awake and alert on exam, Neurologic exam is nonfocal.  Imaging was obtained which is negative for acute findings. Neurologic exam remains non-focal, tolerating fluids without difficulty. Patient will be discharged home with close PCP follow-up.  Discussed plan with patient, he/she acknowledged understanding and agreed with plan of care.  Return precautions given for new or worsening symptoms.  Garlon Hatchet, PA-C 08/04/14 1151  Gilda Crease, MD 08/04/14 [phone]

## 2014-08-04 NOTE — ED Notes (Signed)
Pt states he accidentally tripped on toy laying on floor last night, unknown LOC. Now states head and R arm pain and R index finger pain. No obvious deformities noted. Pt is alert and oriented x4. Ambulatory to triage.

## 2014-08-04 NOTE — Discharge Instructions (Signed)
May take over the counter tylenol or motrin as needed for pain. Follow-up with your primary care physician. Return to the ED for new or worsening symptoms.

## 2014-08-07 ENCOUNTER — Other Ambulatory Visit: Payer: Self-pay

## 2014-08-07 ENCOUNTER — Telehealth: Payer: Self-pay | Admitting: Gastroenterology

## 2014-08-07 ENCOUNTER — Emergency Department (HOSPITAL_COMMUNITY)
Admission: EM | Admit: 2014-08-07 | Discharge: 2014-08-07 | Disposition: A | Discharge status: Home / Self Care | Payer: Self-pay | Attending: Emergency Medicine | Admitting: Emergency Medicine

## 2014-08-07 ENCOUNTER — Ambulatory Visit (HOSPITAL_COMMUNITY)
Admission: RE | Admit: 2014-08-07 | Discharge: 2014-08-07 | Disposition: A | Discharge status: Home / Self Care | Payer: Self-pay | Source: Ambulatory Visit | Attending: Gastroenterology | Admitting: Gastroenterology

## 2014-08-07 ENCOUNTER — Emergency Department (HOSPITAL_COMMUNITY): Payer: Self-pay

## 2014-08-07 ENCOUNTER — Encounter (HOSPITAL_COMMUNITY): Payer: Self-pay

## 2014-08-07 DIAGNOSIS — R197 Diarrhea, unspecified: Secondary | ICD-10-CM | POA: Insufficient documentation

## 2014-08-07 DIAGNOSIS — R110 Nausea: Secondary | ICD-10-CM

## 2014-08-07 DIAGNOSIS — R109 Unspecified abdominal pain: Secondary | ICD-10-CM | POA: Insufficient documentation

## 2014-08-07 DIAGNOSIS — R112 Nausea with vomiting, unspecified: Secondary | ICD-10-CM | POA: Insufficient documentation

## 2014-08-07 DIAGNOSIS — R079 Chest pain, unspecified: Secondary | ICD-10-CM | POA: Insufficient documentation

## 2014-08-07 DIAGNOSIS — Z79899 Other long term (current) drug therapy: Secondary | ICD-10-CM | POA: Insufficient documentation

## 2014-08-07 DIAGNOSIS — Z791 Long term (current) use of non-steroidal anti-inflammatories (NSAID): Secondary | ICD-10-CM | POA: Insufficient documentation

## 2014-08-07 DIAGNOSIS — R140 Abdominal distension (gaseous): Secondary | ICD-10-CM

## 2014-08-07 DIAGNOSIS — Z9089 Acquired absence of other organs: Secondary | ICD-10-CM | POA: Insufficient documentation

## 2014-08-07 DIAGNOSIS — Z862 Personal history of diseases of the blood and blood-forming organs and certain disorders involving the immune mechanism: Secondary | ICD-10-CM | POA: Insufficient documentation

## 2014-08-07 DIAGNOSIS — Z8719 Personal history of other diseases of the digestive system: Secondary | ICD-10-CM | POA: Insufficient documentation

## 2014-08-07 DIAGNOSIS — Z8659 Personal history of other mental and behavioral disorders: Secondary | ICD-10-CM | POA: Insufficient documentation

## 2014-08-07 LAB — BASIC METABOLIC PANEL
Anion gap: 10 (ref 5–15)
BUN: 16 mg/dL (ref 6–23)
CALCIUM: 10 mg/dL (ref 8.4–10.5)
CO2: 25 mmol/L (ref 19–32)
CREATININE: 0.8 mg/dL (ref 0.50–1.35)
Chloride: 105 mmol/L (ref 96–112)
GFR calc Af Amer: >90 mL/min (ref >90)
GFR calc non Af Amer: >90 mL/min (ref >90)
Glucose, Bld: 100 mg/dL — ABNORMAL HIGH (ref 70–99)
Potassium: 3.8 mmol/L (ref 3.5–5.1)
Sodium: 140 mmol/L (ref 135–145)

## 2014-08-07 LAB — CBC
HCT: 37.8 % — ABNORMAL LOW (ref 39.0–52.0)
Hemoglobin: 11.8 g/dL — ABNORMAL LOW (ref 13.0–17.0)
MCH: 22.9 pg — ABNORMAL LOW (ref 26.0–34.0)
MCHC: 31.2 g/dL (ref 30.0–36.0)
MCV: 73.4 fL — ABNORMAL LOW (ref 78.0–100.0)
Platelets: 361 10*3/uL (ref 150–400)
RBC: 5.15 MIL/uL (ref 4.22–5.81)
RDW: 16.1 % — ABNORMAL HIGH (ref 11.5–15.5)
WBC: 11.2 10*3/uL — ABNORMAL HIGH (ref 4.0–10.5)

## 2014-08-07 LAB — LIPASE, BLOOD: Lipase: 19 U/L (ref 11–59)

## 2014-08-07 LAB — I-STAT TROPONIN, ED: Troponin i, poc: 0 ng/mL (ref 0.00–0.08)

## 2014-08-07 MED ORDER — METOCLOPRAMIDE HCL 5 MG/ML IJ SOLN
10.0000 mg | Freq: Once | INTRAMUSCULAR | Status: AC
  Administered 2014-08-07: 10 mg via INTRAVENOUS
  Filled 2014-08-07: qty 2

## 2014-08-07 MED ORDER — PREDNISONE 20 MG PO TABS: 40.0000 mg | ORAL_TABLET | Freq: Every day | ORAL | Status: DC

## 2014-08-07 MED ORDER — IOHEXOL 300 MG/ML  SOLN
100.0000 mL | Freq: Once | INTRAMUSCULAR | Status: AC | PRN
  Administered 2014-08-07: 100 mL via INTRAVENOUS

## 2014-08-07 MED ORDER — DICYCLOMINE HCL 20 MG PO TABS: 20.0000 mg | ORAL_TABLET | Freq: Two times a day (BID) | ORAL | Status: DC

## 2014-08-07 MED ORDER — SODIUM CHLORIDE 0.9 % IV BOLUS (SEPSIS)
1000.0000 mL | Freq: Once | INTRAVENOUS | Status: AC
  Administered 2014-08-07: 1000 mL via INTRAVENOUS

## 2014-08-07 MED ORDER — IOHEXOL 300 MG/ML  SOLN
50.0000 mL | Freq: Once | INTRAMUSCULAR | Status: AC | PRN
  Administered 2014-08-07: 50 mL via ORAL

## 2014-08-07 MED ORDER — DIPHENHYDRAMINE HCL 50 MG/ML IJ SOLN
25.0000 mg | Freq: Once | INTRAMUSCULAR | Status: AC
  Administered 2014-08-07: 25 mg via INTRAVENOUS
  Filled 2014-08-07: qty 1

## 2014-08-07 NOTE — Telephone Encounter (Signed)
He has completed the ATB's. He c/o waves of nausea that are not relieved by Zofran. He states he is taking Protonix and Prilosec at the same time each morning when he gets ready to eat. He was a little vague on that. He has an MRI scheduled today at $pm. He states he is having bowel cramps and "going" a lot.  He wants a different med for nausea. Please advise.

## 2014-08-07 NOTE — ED Notes (Signed)
Pt presents with c/o chest pain, vomiting, and diarrhea. Pt reports the chest pain started today and is in the left of his chest, sharp and shooting in nature. Pt reports he has vomited approx 5-6 times today and has had approx 5-6 episodes of diarrhea today. Pt reports he has been taking medication for h. Pylori, just finished the medication end of last week.

## 2014-08-07 NOTE — ED Notes (Signed)
Pt reports no pain at present time but reports jittery and sweaty post medication administration.

## 2014-08-07 NOTE — ED Notes (Signed)
Patient is aware of upcoming CT scans.

## 2014-08-07 NOTE — Discharge Instructions (Signed)
Abdominal Pain Many things can cause abdominal pain. Usually, abdominal pain is not caused by a disease and will improve without treatment. It can often be observed and treated at home. Your health care provider will do a physical exam and possibly order blood tests and X-rays to help determine the seriousness of your pain. However, in many cases, more time must pass before a clear cause of the pain can be found. Before that point, your health care provider may not know if you need more testing or further treatment. HOME CARE INSTRUCTIONS  Monitor your abdominal pain for any changes. The following actions may help to alleviate any discomfort you are experiencing:  Only take over-the-counter or prescription medicines as directed by your health care provider.  Do not take laxatives unless directed to do so by your health care provider.  Try a clear liquid diet (broth, tea, or water) as directed by your health care provider. Slowly move to a bland diet as tolerated. SEEK MEDICAL CARE IF:  You have unexplained abdominal pain.  You have abdominal pain associated with nausea or diarrhea.  You have pain when you urinate or have a bowel movement.  You experience abdominal pain that wakes you in the night.  You have abdominal pain that is worsened or improved by eating food.  You have abdominal pain that is worsened with eating fatty foods.  You have a fever. SEEK IMMEDIATE MEDICAL CARE IF:   Your pain does not go away within 2 hours.  You keep throwing up (vomiting).  Your pain is felt only in portions of the abdomen, such as the right side or the left lower portion of the abdomen.  You pass bloody or black tarry stools. MAKE SURE YOU:  Understand these instructions.   Will watch your condition.   Will get help right away if you are not doing well or get worse.  Document Released: 04/02/2005 Document Revised: 06/28/2013 Document Reviewed: 03/02/2013 [facility] Patient Information  2015 [employer] [place]. This information is not intended to replace advice given to you by your health care provider. Make sure you discuss any questions you have with your health care provider.  You were evaluated in the ED today for your abdominal discomfort. There does not appear to be an emergent cause for your symptoms at this time. It is important for you to follow-up with primary care as well as gastroenterology for further evaluation and management of her symptoms. Return to ED for new or worsening symptoms. Please take your medications as prescribed.

## 2014-08-07 NOTE — ED Notes (Signed)
Reports mild nausea.

## 2014-08-07 NOTE — ED Provider Notes (Signed)
CSN: [id number]     Arrival date & time 08/07/14  1540 History   First MD Initiated Contact with Patient 08/07/14 1747     Chief Complaint  Patient presents with  . Chest Pain  . Diarrhea  . Emesis     (Consider location/radiation/quality/duration/timing/severity/associated sxs/prior Treatment) HPI Levi Palmer is a 29 y.o. male recently completed treatment for H. pylori comes in for evaluation of nausea, vomiting, diarrhea and abdominal discomfort. He reports yesterday he ate "more junk food and should have" and lists that he ate ham, a large bag of cheetos, tuna, and a quarter pounder from [employer] and has subsequently had multiple bowel movements with nausea and vomiting today. He denies bloody or bilious emesis, no blood in his stools. He reports his abdominal discomfort is a 7/10 and feels like "a punch in the gut". Patient states he is followed by Dr. Christella Hartigan from [facility] who sent him for an outpt MRI to see if he had gallstones in his pancreas today, but he was unable to complete due to his nausea. Reports the MRI tech told him to go to ED.   Past Medical History  Diagnosis Date  . Medical history non-contributory   . Anxiety   . Panic attack as reaction to stress   . Anemia   . Depression   . Gallstones    Past Surgical History  Procedure Laterality Date  . Cholecystectomy    . Tonsillectomy    . Incise and drain abcess    . Mouth surgery     Family History  Problem Relation Age of Onset  . Irritable bowel syndrome Mother   . Ulcerative colitis Maternal Grandfather   . Colon cancer Neg Hx   . Colon polyps Neg Hx   . Kidney disease Mother   . Gallbladder disease Neg Hx   . Esophageal cancer Neg Hx   . Diabetes Neg Hx    History  Substance Use Topics  . Smoking status: Never Smoker   . Smokeless tobacco: Never Used  . Alcohol Use: No    Review of Systems  All other systems reviewed and are negative.  A 10 point review of systems was completed and was  negative except for pertinent positives and negatives as mentioned in the history of present illness     Allergies  Tramadol and Zoloft  Home Medications   Prior to Admission medications   Medication Sig Start Date End Date Taking? Authorizing Provider  diclofenac sodium (VOLTAREN) 1 % GEL Apply 2 g topically 4 (four) times daily. 08/03/14  Yes Ambrose Finland, NP  ibuprofen (ADVIL,MOTRIN) 200 MG tablet Take 400 mg by mouth every 6 (six) hours as needed for moderate pain (pain).   Yes Historical Provider, MD  omeprazole (PRILOSEC) 20 MG capsule Take 1 capsule (20 mg total) by mouth 2 (two) times daily before a meal. Patient taking differently: Take 20 mg by mouth daily.  06/11/14  Yes Ambrose Finland, NP  ondansetron (ZOFRAN) 8 MG tablet Take 1 tablet (8 mg total) by mouth 2 (two) times daily. Patient taking differently: Take 8 mg by mouth every 8 (eight) hours as needed for nausea or vomiting (nausea & vomiting).  07/31/14  Yes Rachael Fee, MD  pantoprazole (PROTONIX) 40 MG tablet Take 1 tablet (40 mg total) by mouth daily. 07/12/14  Yes Suzi Roots, MD  dicyclomine (BENTYL) 20 MG tablet Take 1 tablet (20 mg total) by mouth 2 (two) times daily. 08/07/14  Earle Gell Jade Burkard, PA-C  predniSONE (DELTASONE) 20 MG tablet Take 2 tablets (40 mg total) by mouth daily. 08/07/14   Earle Gell Schyler Counsell, PA-C   BP 135/83 mmHg  Pulse 87  Temp(Src) 97.9 F (36.6 C) (Oral)  Resp 20  SpO2 98% Physical Exam  Constitutional: He is oriented to person, place, and time. He appears well-developed and well-nourished.  HENT:  Head: Normocephalic and atraumatic.  Mouth/Throat: Oropharynx is clear and moist.  Eyes: Conjunctivae are normal. Pupils are equal, round, and reactive to light. Right eye exhibits no discharge. Left eye exhibits no discharge. No scleral icterus.  Neck: Neck supple.  Cardiovascular: Normal rate, regular rhythm and normal heart sounds.   Pulmonary/Chest: Effort normal and breath sounds  normal. No respiratory distress. He has no wheezes. He has no rales.  Abdominal: Soft. There is no tenderness.  Abdomen is soft, nontender, nondistended. No overt lesions, deformities, palpable masses. No rebound or guarding  Musculoskeletal: He exhibits no tenderness.  Neurological: He is alert and oriented to person, place, and time.  Cranial Nerves II-XII grossly intact  Skin: Skin is warm and dry. No rash noted.  Psychiatric: He has a normal mood and affect.  Nursing note and vitals reviewed.   ED Course  Procedures (including critical care time) Labs Review Labs Reviewed  CBC - Abnormal; Notable for the following:    WBC 11.2 (*)    Hemoglobin 11.8 (*)    HCT 37.8 (*)    MCV 73.4 (*)    MCH 22.9 (*)    RDW 16.1 (*)    All other components within normal limits  BASIC METABOLIC PANEL - Abnormal; Notable for the following:    Glucose, Bld 100 (*)    All other components within normal limits  LIPASE, BLOOD  I-STAT TROPOININ, ED    Imaging Review Ct Chest W Contrast  08/07/2014   CLINICAL DATA:  Acute onset of left-sided chest pain, vomiting and diarrhea. Initial encounter.  EXAM: CT CHEST, ABDOMEN, AND PELVIS WITH CONTRAST  TECHNIQUE: Multidetector CT imaging of the chest, abdomen and pelvis was performed following the standard protocol during bolus administration of intravenous contrast.  CONTRAST:  50mL OMNIPAQUE IOHEXOL 300 MG/ML SOLN, OMNIPAQUE IOHEXOL 300 MG/ML SOLN  COMPARISON:  Chest radiograph performed earlier today at 7:20 p.m., and CT of the chest performed 07/26/2013; CT of the abdomen and pelvis performed 05/06/2014  FINDINGS: CT CHEST FINDINGS  The lungs are clear bilaterally. No focal consolidation, pleural effusion or pneumothorax is seen. No masses are identified.  The mediastinum is unremarkable in appearance. No mediastinal lymphadenopathy is seen. No pericardial effusion is identified. The great vessels are grossly unremarkable. The thyroid gland is  unremarkable in appearance. No axillary lymphadenopathy is seen.  No acute osseous abnormalities are identified.  CT ABDOMEN AND PELVIS FINDINGS  The liver and spleen are unremarkable in appearance. The spleen is somewhat bulky, though still borderline normal in size. The patient is status post cholecystectomy, with clips noted at the gallbladder fossa. The pancreas and adrenal glands are unremarkable.  The kidneys are unremarkable in appearance. There is no evidence of hydronephrosis. No renal or ureteral stones are seen. No perinephric stranding is appreciated.  No free fluid is identified. The small bowel is unremarkable in appearance. The stomach is within normal limits. No acute vascular abnormalities are seen.  The appendix is difficult to fully characterize, but appears grossly normal in caliber, without evidence for appendicitis.  There is focal wall thickening along the  mid to distal sigmoid colon and rectum, which may reflect an infectious or inflammatory process. A large number of prominent nodes are seen within the left hemipelvis, measuring up to 1.0 cm in short axis. This would be rather unusual for acute infection. Inflammatory bowel disease could result in this degree of lymphadenopathy. Malignancy and nodal metastasis cannot be excluded, though no well-defined mass is seen.  Apparent wall thickening is also noted at the cecum; this is nonspecific and may simply reflect decompression, though this could also be assessed on the colonoscopy.  The bladder is mildly distended and grossly unremarkable. The prostate remains normal in size. No inguinal lymphadenopathy is seen.  No acute osseous abnormalities are identified.  IMPRESSION: 1. Focal wall thickening along the mid to distal sigmoid colon and rectum, which may reflect an infectious or inflammatory process. Large number of prominent nodes noted within the left hemipelvis, measuring up to 1.0 cm in short axis. This degree of lymphadenopathy would be  rather unusual for acute infection. Inflammatory bowel disease could have such an appearance. Malignancy and nodal metastasis cannot be excluded, though no well-defined mass is seen. When the patient is able to tolerate the study, colonoscopy would be helpful for further evaluation. 2. Apparent wall thickening at the cecum is nonspecific and may simply reflect focal decompression, though this could also be assessed on the colonoscopy. 3. Otherwise unremarkable CT of the chest, abdomen and pelvis.   Electronically Signed   By: Roanna RaiderJeffery  Chang M.D.   On: 08/07/2014 21:58   Ct Abdomen Pelvis W Contrast  08/07/2014   CLINICAL DATA:  Acute onset of left-sided chest pain, vomiting and diarrhea. Initial encounter.  EXAM: CT CHEST, ABDOMEN, AND PELVIS WITH CONTRAST  TECHNIQUE: Multidetector CT imaging of the chest, abdomen and pelvis was performed following the standard protocol during bolus administration of intravenous contrast.  CONTRAST:  50mL OMNIPAQUE IOHEXOL 300 MG/ML SOLN, 100mL OMNIPAQUE IOHEXOL 300 MG/ML SOLN  COMPARISON:  Chest radiograph performed earlier today at 7:20 p.m., and CT of the chest performed 07/26/2013; CT of the abdomen and pelvis performed 05/06/2014  FINDINGS: CT CHEST FINDINGS  The lungs are clear bilaterally. No focal consolidation, pleural effusion or pneumothorax is seen. No masses are identified.  The mediastinum is unremarkable in appearance. No mediastinal lymphadenopathy is seen. No pericardial effusion is identified. The great vessels are grossly unremarkable. The thyroid gland is unremarkable in appearance. No axillary lymphadenopathy is seen.  No acute osseous abnormalities are identified.  CT ABDOMEN AND PELVIS FINDINGS  The liver and spleen are unremarkable in appearance. The spleen is somewhat bulky, though still borderline normal in size. The patient is status post cholecystectomy, with clips noted at the gallbladder fossa. The pancreas and adrenal glands are unremarkable.  The  kidneys are unremarkable in appearance. There is no evidence of hydronephrosis. No renal or ureteral stones are seen. No perinephric stranding is appreciated.  No free fluid is identified. The small bowel is unremarkable in appearance. The stomach is within normal limits. No acute vascular abnormalities are seen.  The appendix is difficult to fully characterize, but appears grossly normal in caliber, without evidence for appendicitis.  There is focal wall thickening along the mid to distal sigmoid colon and rectum, which may reflect an infectious or inflammatory process. A large number of prominent nodes are seen within the left hemipelvis, measuring up to 1.0 cm in short axis. This would be rather unusual for acute infection. Inflammatory bowel disease could result in this degree of lymphadenopathy.  Malignancy and nodal metastasis cannot be excluded, though no well-defined mass is seen.  Apparent wall thickening is also noted at the cecum; this is nonspecific and may simply reflect decompression, though this could also be assessed on the colonoscopy.  The bladder is mildly distended and grossly unremarkable. The prostate remains normal in size. No inguinal lymphadenopathy is seen.  No acute osseous abnormalities are identified.  IMPRESSION: 1. Focal wall thickening along the mid to distal sigmoid colon and rectum, which may reflect an infectious or inflammatory process. Large number of prominent nodes noted within the left hemipelvis, measuring up to 1.0 cm in short axis. This degree of lymphadenopathy would be rather unusual for acute infection. Inflammatory bowel disease could have such an appearance. Malignancy and nodal metastasis cannot be excluded, though no well-defined mass is seen. When the patient is able to tolerate the study, colonoscopy would be helpful for further evaluation. 2. Apparent wall thickening at the cecum is nonspecific and may simply reflect focal decompression, though this could also be  assessed on the colonoscopy. 3. Otherwise unremarkable CT of the chest, abdomen and pelvis.   Electronically Signed   By: Roanna Raider M.D.   On: 08/07/2014 21:58   Dg Abd Acute W/chest  08/07/2014   CLINICAL DATA:  Abdominal pain and nausea.  EXAM: ACUTE ABDOMEN SERIES (ABDOMEN 2 VIEW & CHEST 1 VIEW)  COMPARISON:  CT 05/06/2014  FINDINGS: Lungs are clear.  No apparent beneath the hemidiaphragm.  Cholecystectomy clips in the right lower quadrant. There is some mucosal edema of the descending colon over a relatively long segment. Mildly dilated loops of small bowel up to 3 cm. There is gas the rectum.  IMPRESSION: 1. Mildly dilated loops small bowel and segmental mucosal thickening of the descending colon. Findings could represent colitis with focal ileus. Consider CT the abdomen and pelvis with contrast.  2. No evidence of bowel obstruction.   Electronically Signed   By: Genevive Bi M.D.   On: 08/07/2014 20:00     EKG Interpretation None     Meds given in ED:  Medications  metoCLOPramide (REGLAN) injection 10 mg (10 mg Intravenous Given 08/07/14 1836)  sodium chloride 0.9 % bolus 1,000 mL (0 mLs Intravenous Stopped 08/07/14 1931)  diphenhydrAMINE (BENADRYL) injection 25 mg (25 mg Intravenous Given 08/07/14 1857)  metoCLOPramide (REGLAN) injection 10 mg (10 mg Intravenous Given 08/07/14 2012)  iohexol (OMNIPAQUE) 300 MG/ML solution 50 mL (50 mLs Oral Contrast Given 08/07/14 2050)  iohexol (OMNIPAQUE) 300 MG/ML solution 100 mL (100 mLs Intravenous Contrast Given 08/07/14 2130)    Discharge Medication List as of 08/07/2014 11:31 PM    START taking these medications   Details  dicyclomine (BENTYL) 20 MG tablet Take 1 tablet (20 mg total) by mouth 2 (two) times daily., Starting 08/07/2014, Until Discontinued, Print    predniSONE (DELTASONE) 20 MG tablet Take 2 tablets (40 mg total) by mouth daily., Starting 08/07/2014, Until Discontinued, Print       Filed Vitals:   08/07/14 1854 08/07/14 1931  08/07/14 2113 08/07/14 2246  BP:  118/76 127/77 135/83  Pulse: 103 83 70 87  Temp:  97.8 F (36.6 C)  97.9 F (36.6 C)  TempSrc:  Oral  Oral  Resp: 18 18 20 20   SpO2: 100% 98% 100% 98%    MDM  Vitals stable - WNL -afebrile Pt resting comfortably in ED.  PE--Recheck at 20:00, pt denies any discomfort. No nausea or diarrhea in ED. Labwork--nonspecific leukocytosis, likely due  to vomiting and/or stress response.  Imaging--acute abdomen and chest plain films were obtained and showed mildly dilated loops of small bowel with potential representation of colitis or focal ileus, recommend CT of the abdomen. Will obtain CT abdomen and chest in order to further evaluate the patient's symptoms and rule out pneumomediastinum. CT chest shows no evidence of pneumonia, pneumothorax or pneumomediastinum. CT abdomen shows nonspecific wall thickening at the cecum and focal wall thickening along mid to distal sigmoid colon which could be inflammatory process. Recommends follow-up colonoscopy when tolerated.  DDX-No evidence of acute or surgical abdomen. Low concern for emergent pathology at this time. Will DC with referral to GI and resource guide, Bentyl. Will give short course steroids for potential inflammatory process. Pt has Zofran at home. I discussed all relevant lab findings and imaging results with pt and they verbalized understanding. Discussed f/u with PCP within 48 hrs and return precautions, pt very amenable to plan.    Prior to patient discharge, I discussed and reviewed this case with Dr.Goldston    Final diagnoses:  Abdominal discomfort       Sharlene Motts, PA-C 08/08/14 1052  Audree Camel, MD 08/12/14 [phone]

## 2014-08-09 ENCOUNTER — Other Ambulatory Visit: Payer: Self-pay | Admitting: Internal Medicine

## 2014-08-15 ENCOUNTER — Other Ambulatory Visit: Payer: Self-pay | Admitting: Internal Medicine

## 2014-08-15 NOTE — Telephone Encounter (Signed)
Pt said he is has been waiting over a week about his med

## 2014-08-16 NOTE — Telephone Encounter (Signed)
Zofran 8 mg was sent to the pharmacy on 07/27/14.  Pt was notified

## 2014-08-16 NOTE — Telephone Encounter (Signed)
The pt wants to have something other than zofran, or phenergan prescribed for nausea.  Please respond

## 2014-08-17 MED ORDER — METOCLOPRAMIDE HCL 10 MG PO TABS: 10.0000 mg | ORAL_TABLET | Freq: Three times a day (TID) | ORAL | Status: DC

## 2014-08-17 NOTE — Telephone Encounter (Signed)
OK, lets try reglan 10mg  pills, take one pill every 8 hours for nausea.  I cut and pasted his instructions from OV a month ago.  He cancelled the MRI and needs to reschedule that.  Can you review with him the other recommendations (no nsaids, +omeprazole daily).    MRI with MRCP images to check for retained gallstones. [facility] Your appointment time is 3pm. Please arrive 15 minutes prior to your appointment time for registration purposes. Please make certain not to have anything to eat or drink 6 hours prior to your test. In addition, if you have any metal in your body, have a pacemaker or defibrillator, please be sure to let your ordering physician know. This test typically takes 45 minutes to 1 hour to complete. If the MRI is 'negative' then you may need EGD. Prescription for zofran to prevent nausea (new script written). NSAID type medicine (advil, ibuprofen) can cause ulcers, stomach upset. Try to avoid these if possible. Tylenol is safer for your stomach. You must take omeprazole every day (OTC, one pill once daily). This helps prevent ulcers from NSAID medicines.

## 2014-08-17 NOTE — Telephone Encounter (Signed)
Pt aware, he has MRI scheduled for tomorrow and other recommendations were discussed.

## 2014-08-18 ENCOUNTER — Ambulatory Visit (HOSPITAL_COMMUNITY)
Admission: RE | Admit: 2014-08-18 | Discharge: 2014-08-18 | Disposition: A | Discharge status: Home / Self Care | Payer: Self-pay | Source: Ambulatory Visit | Attending: Gastroenterology | Admitting: Gastroenterology

## 2014-08-18 ENCOUNTER — Other Ambulatory Visit: Payer: Self-pay | Admitting: Gastroenterology

## 2014-08-18 DIAGNOSIS — R109 Unspecified abdominal pain: Secondary | ICD-10-CM

## 2014-08-18 DIAGNOSIS — R110 Nausea: Secondary | ICD-10-CM

## 2014-08-18 DIAGNOSIS — R140 Abdominal distension (gaseous): Secondary | ICD-10-CM

## 2014-08-18 DIAGNOSIS — R1084 Generalized abdominal pain: Secondary | ICD-10-CM | POA: Insufficient documentation

## 2014-08-18 DIAGNOSIS — R112 Nausea with vomiting, unspecified: Secondary | ICD-10-CM | POA: Insufficient documentation

## 2014-08-18 MED ORDER — GADOBENATE DIMEGLUMINE 529 MG/ML IV SOLN: 10.0000 mL | Freq: Once | INTRAVENOUS | Status: AC | PRN

## 2014-08-18 MED ORDER — GADOBENATE DIMEGLUMINE 529 MG/ML IV SOLN
15.0000 mL | Freq: Once | INTRAVENOUS | Status: AC | PRN
  Administered 2014-08-18: 13 mL via INTRAVENOUS

## 2014-09-08 ENCOUNTER — Encounter: Payer: Self-pay | Admitting: Internal Medicine

## 2014-09-08 ENCOUNTER — Ambulatory Visit: Payer: Self-pay | Attending: Internal Medicine | Admitting: Internal Medicine

## 2014-09-08 VITALS — BP 119/78 | HR 78 | Temp 98.0°F | Resp 15 | Wt 136.6 lb

## 2014-09-08 DIAGNOSIS — R112 Nausea with vomiting, unspecified: Secondary | ICD-10-CM | POA: Insufficient documentation

## 2014-09-08 DIAGNOSIS — M542 Cervicalgia: Secondary | ICD-10-CM | POA: Insufficient documentation

## 2014-09-08 MED ORDER — PROMETHAZINE HCL 12.5 MG PO TABS: 12.5000 mg | ORAL_TABLET | Freq: Three times a day (TID) | ORAL | Status: DC | PRN

## 2014-09-08 NOTE — Progress Notes (Signed)
Patient states he has a history of Degenerative disc disease Patient complains of having pain in his neck that is getting worse No other complaints today

## 2014-09-08 NOTE — Patient Instructions (Signed)
May begin tylenol for neck pain, call back in one week if you have not heard back from Ortho.  The H.pylori test will not help us today but it should be resolved since you completed all treatment. I recommend calling GI doctor back and letting him know that you did treatment for H. Pylori and you are still having pain.  I am not sure what else to do for your nausea and abdominal pain now.

## 2014-09-08 NOTE — Progress Notes (Signed)
   Subjective:    Patient ID: Levi Palmer, male    DOB: [birth year], 29 y.o.   MRN: [id number]  Neck Pain  This is a chronic problem. The current episode started more than 1 year ago. The problem occurs constantly. The problem has been gradually worsening. The pain is associated with nothing. The pain is present in the anterior neck, left side and right side. The quality of the pain is described as aching. The pain is moderate. The pain is same all the time. Pertinent negatives include no headaches, numbness, pain with swallowing or tingling. He has tried NSAIDs and muscle relaxants for the symptoms. The treatment provided no relief.      Review of Systems  Gastrointestinal: Positive for nausea and abdominal pain. Negative for vomiting.  Musculoskeletal: Positive for neck pain.  Neurological: Negative for tingling, numbness and headaches.       Objective:   Physical Exam  Constitutional: He is oriented to person, place, and time.  Cardiovascular: Normal rate, regular rhythm and normal heart sounds.   Pulmonary/Chest: Effort normal and breath sounds normal.  Abdominal: Soft. Bowel sounds are normal.  Musculoskeletal: He exhibits no tenderness.  Neurological: He is alert and oriented to person, place, and time.  Skin: Skin is warm and dry.      Assessment & Plan:  Levi Palmer was seen today for neck pain.  Diagnoses and all orders for this visit:  Neck pain Orders: -     Ambulatory referral to Orthopedic Surgery  Non-intractable vomiting with nausea, vomiting of unspecified type Orders: -     promethazine (PHENERGAN) 12.5 MG tablet; Take 1 tablet (12.5 mg total) by mouth every 8 (eight) hours as needed for nausea or vomiting. Explained that he will need to contact his GI doctor to set up a future appointment for his chronic abdominal pain. Explained to him that there is nothing else that I can do, all else will need to be addressed to specialist.  Return if symptoms worsen or  fail to improve.  Holland CommonsKECK, VALERIE, NP 09/10/2014 8:23 PM

## 2014-09-15 ENCOUNTER — Emergency Department (HOSPITAL_COMMUNITY)
Admission: EM | Admit: 2014-09-15 | Discharge: 2014-09-15 | Disposition: A | Discharge status: Home / Self Care | Payer: Self-pay | Attending: Emergency Medicine | Admitting: Emergency Medicine

## 2014-09-15 ENCOUNTER — Emergency Department (HOSPITAL_COMMUNITY): Payer: Self-pay

## 2014-09-15 ENCOUNTER — Encounter (HOSPITAL_COMMUNITY): Payer: Self-pay | Admitting: Emergency Medicine

## 2014-09-15 DIAGNOSIS — Z8719 Personal history of other diseases of the digestive system: Secondary | ICD-10-CM | POA: Insufficient documentation

## 2014-09-15 DIAGNOSIS — Y9289 Other specified places as the place of occurrence of the external cause: Secondary | ICD-10-CM | POA: Insufficient documentation

## 2014-09-15 DIAGNOSIS — M542 Cervicalgia: Secondary | ICD-10-CM

## 2014-09-15 DIAGNOSIS — X58XXXA Exposure to other specified factors, initial encounter: Secondary | ICD-10-CM | POA: Insufficient documentation

## 2014-09-15 DIAGNOSIS — Z791 Long term (current) use of non-steroidal anti-inflammatories (NSAID): Secondary | ICD-10-CM | POA: Insufficient documentation

## 2014-09-15 DIAGNOSIS — Y998 Other external cause status: Secondary | ICD-10-CM | POA: Insufficient documentation

## 2014-09-15 DIAGNOSIS — S161XXA Strain of muscle, fascia and tendon at neck level, initial encounter: Secondary | ICD-10-CM | POA: Insufficient documentation

## 2014-09-15 DIAGNOSIS — Z79899 Other long term (current) drug therapy: Secondary | ICD-10-CM | POA: Insufficient documentation

## 2014-09-15 DIAGNOSIS — Y9389 Activity, other specified: Secondary | ICD-10-CM | POA: Insufficient documentation

## 2014-09-15 DIAGNOSIS — Z862 Personal history of diseases of the blood and blood-forming organs and certain disorders involving the immune mechanism: Secondary | ICD-10-CM | POA: Insufficient documentation

## 2014-09-15 DIAGNOSIS — Z8659 Personal history of other mental and behavioral disorders: Secondary | ICD-10-CM | POA: Insufficient documentation

## 2014-09-15 MED ORDER — KETOROLAC TROMETHAMINE 60 MG/2ML IM SOLN
60.0000 mg | Freq: Once | INTRAMUSCULAR | Status: AC
Start: 2014-09-15 — End: 2014-09-15
  Administered 2014-09-15: 60 mg via INTRAMUSCULAR
  Filled 2014-09-15: qty 2

## 2014-09-15 MED ORDER — CYCLOBENZAPRINE HCL 10 MG PO TABS: 5.0000 mg | ORAL_TABLET | Freq: Every day | ORAL | Status: DC

## 2014-09-15 NOTE — Discharge Instructions (Signed)
Please call your doctor for a followup appointment within 24-48 hours. When you talk to your doctor please let them know that you were seen in the emergency department and have them acquire all of your records so that they can discuss the findings with you and formulate a treatment plan to fully care for your new and ongoing problems. Please call and set up an appointment with your primary care provider Please rest and stay hydrated Please follow-up with orthopedics Please take medications as prescribed-muscle relaxers can lead to drowsiness so please do not take quadrant alcohol, driving, operating any heavy machinery. Please take at bedtime. Please apply warm compressions and massage with icy hot ointment Please continue to monitor symptoms closely and if symptoms are to worsen or change (fever greater than 101, chills, sweating, nausea, vomiting, chest pain, shortness of breathe, difficulty breathing, weakness, numbness, tingling, worsening or changes to pain pattern, fall, neck swelling, numbness, tingling, loss of sensation) please report back to the Emergency Department immediately.   Cervical Strain and Sprain (Whiplash) with Rehab Cervical strain and sprain are injuries that commonly occur with "whiplash" injuries. Whiplash occurs when the neck is forcefully whipped backward or forward, such as during a motor vehicle accident or during contact sports. The muscles, ligaments, tendons, discs, and nerves of the neck are susceptible to injury when this occurs. RISK FACTORS Risk of having a whiplash injury increases if:  Osteoarthritis of the spine.  Situations that make head or neck accidents or trauma more likely.  High-risk sports (football, rugby, wrestling, hockey, auto racing, gymnastics, diving, contact karate, or boxing).  Poor strength and flexibility of the neck.  Previous neck injury.  Poor tackling technique.  Improperly fitted or padded equipment. SYMPTOMS   Pain or  stiffness in the front or back of neck or both.  Symptoms may present immediately or up to 24 hours after injury.  Dizziness, headache, nausea, and vomiting.  Muscle spasm with soreness and stiffness in the neck.  Tenderness and swelling at the injury site. PREVENTION  Learn and use proper technique (avoid tackling with the head, spearing, and head-butting; use proper falling techniques to avoid landing on the head).  Warm up and stretch properly before activity.  Maintain physical fitness:  Strength, flexibility, and endurance.  Cardiovascular fitness.  Wear properly fitted and padded protective equipment, such as padded soft collars, for participation in contact sports. PROGNOSIS  Recovery from cervical strain and sprain injuries is dependent on the extent of the injury. These injuries are usually curable in 1 week to 3 months with appropriate treatment.  RELATED COMPLICATIONS   Temporary numbness and weakness may occur if the nerve roots are damaged, and this may persist until the nerve has completely healed.  Chronic pain due to frequent recurrence of symptoms.  Prolonged healing, especially if activity is resumed too soon (before complete recovery). TREATMENT  Treatment initially involves the use of ice and medication to help reduce pain and inflammation. It is also important to perform strengthening and stretching exercises and modify activities that worsen symptoms so the injury does not get worse. These exercises may be performed at home or with a therapist. For patients who experience severe symptoms, a soft, padded collar may be recommended to be worn around the neck.  Improving your posture may help reduce symptoms. Posture improvement includes pulling your chin and abdomen in while sitting or standing. If you are sitting, sit in a firm chair with your buttocks against the back of the chair. While  sleeping, try replacing your pillow with a small towel rolled to 2 inches in  diameter, or use a cervical pillow or soft cervical collar. Poor sleeping positions delay healing.  For patients with nerve root damage, which causes numbness or weakness, the use of a cervical traction apparatus may be recommended. Surgery is rarely necessary for these injuries. However, cervical strain and sprains that are present at birth (congenital) may require surgery. MEDICATION   If pain medication is necessary, nonsteroidal anti-inflammatory medications, such as aspirin and ibuprofen, or other minor pain relievers, such as acetaminophen, are often recommended.  Do not take pain medication for 7 days before surgery.  Prescription pain relievers may be given if deemed necessary by your caregiver. Use only as directed and only as much as you need. HEAT AND COLD:   Cold treatment (icing) relieves pain and reduces inflammation. Cold treatment should be applied for 10 to 15 minutes every 2 to 3 hours for inflammation and pain and immediately after any activity that aggravates your symptoms. Use ice packs or an ice massage.  Heat treatment may be used prior to performing the stretching and strengthening activities prescribed by your caregiver, physical therapist, or athletic trainer. Use a heat pack or a warm soak. SEEK MEDICAL CARE IF:   Symptoms get worse or do not improve in 2 weeks despite treatment.  New, unexplained symptoms develop (drugs used in treatment may produce side effects). EXERCISES RANGE OF MOTION (ROM) AND STRETCHING EXERCISES - Cervical Strain and Sprain These exercises may help you when beginning to rehabilitate your injury. In order to successfully resolve your symptoms, you must improve your posture. These exercises are designed to help reduce the forward-head and rounded-shoulder posture which contributes to this condition. Your symptoms may resolve with or without further involvement from your physician, physical therapist or athletic trainer. While completing these  exercises, remember:   Restoring tissue flexibility helps normal motion to return to the joints. This allows healthier, less painful movement and activity.  An effective stretch should be held for at least 20 seconds, although you may need to begin with shorter hold times for comfort.  A stretch should never be painful. You should only feel a gentle lengthening or release in the stretched tissue. STRETCH- Axial Extensors  Lie on your back on the floor. You may bend your knees for comfort. Place a rolled-up hand towel or dish towel, about 2 inches in diameter, under the part of your head that makes contact with the floor.  Gently tuck your chin, as if trying to make a "double chin," until you feel a gentle stretch at the base of your head.  Hold __________ seconds. Repeat __________ times. Complete this exercise __________ times per day.  STRETCH - Axial Extension   Stand or sit on a firm surface. Assume a good posture: chest up, shoulders drawn back, abdominal muscles slightly tense, knees unlocked (if standing) and feet hip width apart.  Slowly retract your chin so your head slides back and your chin slightly lowers. Continue to look straight ahead.  You should feel a gentle stretch in the back of your head. Be certain not to feel an aggressive stretch since this can cause headaches later.  Hold for __________ seconds. Repeat __________ times. Complete this exercise __________ times per day. STRETCH - Cervical Side Bend   Stand or sit on a firm surface. Assume a good posture: chest up, shoulders drawn back, abdominal muscles slightly tense, knees unlocked (if standing) and feet hip  width apart.  Without letting your nose or shoulders move, slowly tip your right / left ear to your shoulder until your feel a gentle stretch in the muscles on the opposite side of your neck.  Hold __________ seconds. Repeat __________ times. Complete this exercise __________ times per day. STRETCH -  Cervical Rotators   Stand or sit on a firm surface. Assume a good posture: chest up, shoulders drawn back, abdominal muscles slightly tense, knees unlocked (if standing) and feet hip width apart.  Keeping your eyes level with the ground, slowly turn your head until you feel a gentle stretch along the back and opposite side of your neck.  Hold __________ seconds. Repeat __________ times. Complete this exercise __________ times per day. RANGE OF MOTION - Neck Circles   Stand or sit on a firm surface. Assume a good posture: chest up, shoulders drawn back, abdominal muscles slightly tense, knees unlocked (if standing) and feet hip width apart.  Gently roll your head down and around from the back of one shoulder to the back of the other. The motion should never be forced or painful.  Repeat the motion 10-20 times, or until you feel the neck muscles relax and loosen. Repeat __________ times. Complete the exercise __________ times per day. STRENGTHENING EXERCISES - Cervical Strain and Sprain These exercises may help you when beginning to rehabilitate your injury. They may resolve your symptoms with or without further involvement from your physician, physical therapist, or athletic trainer. While completing these exercises, remember:   Muscles can gain both the endurance and the strength needed for everyday activities through controlled exercises.  Complete these exercises as instructed by your physician, physical therapist, or athletic trainer. Progress the resistance and repetitions only as guided.  You may experience muscle soreness or fatigue, but the pain or discomfort you are trying to eliminate should never worsen during these exercises. If this pain does worsen, stop and make certain you are following the directions exactly. If the pain is still present after adjustments, discontinue the exercise until you can discuss the trouble with your clinician. STRENGTH - Cervical Flexors,  Isometric  Face a wall, standing about 6 inches away. Place a small pillow, a ball about 6-8 inches in diameter, or a folded towel between your forehead and the wall.  Slightly tuck your chin and gently push your forehead into the soft object. Push only with mild to moderate intensity, building up tension gradually. Keep your jaw and forehead relaxed.  Hold 10 to 20 seconds. Keep your breathing relaxed.  Release the tension slowly. Relax your neck muscles completely before you start the next repetition. Repeat __________ times. Complete this exercise __________ times per day. STRENGTH- Cervical Lateral Flexors, Isometric   Stand about 6 inches away from a wall. Place a small pillow, a ball about 6-8 inches in diameter, or a folded towel between the side of your head and the wall.  Slightly tuck your chin and gently tilt your head into the soft object. Push only with mild to moderate intensity, building up tension gradually. Keep your jaw and forehead relaxed.  Hold 10 to 20 seconds. Keep your breathing relaxed.  Release the tension slowly. Relax your neck muscles completely before you start the next repetition. Repeat __________ times. Complete this exercise __________ times per day. STRENGTH - Cervical Extensors, Isometric   Stand about 6 inches away from a wall. Place a small pillow, a ball about 6-8 inches in diameter, or a folded towel between the back  of your head and the wall.  Slightly tuck your chin and gently tilt your head back into the soft object. Push only with mild to moderate intensity, building up tension gradually. Keep your jaw and forehead relaxed.  Hold 10 to 20 seconds. Keep your breathing relaxed.  Release the tension slowly. Relax your neck muscles completely before you start the next repetition. Repeat __________ times. Complete this exercise __________ times per day. POSTURE AND BODY MECHANICS CONSIDERATIONS - Cervical Strain and Sprain Keeping correct posture  when sitting, standing or completing your activities will reduce the stress put on different body tissues, allowing injured tissues a chance to heal and limiting painful experiences. The following are general guidelines for improved posture. Your physician or physical therapist will provide you with any instructions specific to your needs. While reading these guidelines, remember:  The exercises prescribed by your provider will help you have the flexibility and strength to maintain correct postures.  The correct posture provides the optimal environment for your joints to work. All of your joints have less wear and tear when properly supported by a spine with good posture. This means you will experience a healthier, less painful body.  Correct posture must be practiced with all of your activities, especially prolonged sitting and standing. Correct posture is as important when doing repetitive low-stress activities (typing) as it is when doing a single heavy-load activity (lifting). PROLONGED STANDING WHILE SLIGHTLY LEANING FORWARD When completing a task that requires you to lean forward while standing in one place for a long time, place either foot up on a stationary 2- to 4-inch high object to help maintain the best posture. When both feet are on the ground, the low back tends to lose its slight inward curve. If this curve flattens (or becomes too large), then the back and your other joints will experience too much stress, fatigue more quickly, and can cause pain.  RESTING POSITIONS Consider which positions are most painful for you when choosing a resting position. If you have pain with flexion-based activities (sitting, bending, stooping, squatting), choose a position that allows you to rest in a less flexed posture. You would want to avoid curling into a fetal position on your side. If your pain worsens with extension-based activities (prolonged standing, working overhead), avoid resting in an extended  position such as sleeping on your stomach. Most people will find more comfort when they rest with their spine in a more neutral position, neither too rounded nor too arched. Lying on a non-sagging bed on your side with a pillow between your knees, or on your back with a pillow under your knees will often provide some relief. Keep in mind, being in any one position for a prolonged period of time, no matter how correct your posture, can still lead to stiffness. WALKING Walk with an upright posture. Your ears, shoulders, and hips should all line up. OFFICE WORK When working at a desk, create an environment that supports good, upright posture. Without extra support, muscles fatigue and lead to excessive strain on joints and other tissues. CHAIR:  A chair should be able to slide under your desk when your back makes contact with the back of the chair. This allows you to work closely.  The chair's height should allow your eyes to be level with the upper part of your monitor and your hands to be slightly lower than your elbows.  Body position:  Your feet should make contact with the floor. If this is not possible,  use a foot rest.  Keep your ears over your shoulders. This will reduce stress on your neck and low back. Document Released: 06/23/2005 Document Revised: 11/07/2013 Document Reviewed: 10/05/2008 [facility] Patient Information 2015 [place], [place]. This information is not intended to replace advice given to you by your health care provider. Make sure you discuss any questions you have with your health care provider.

## 2014-09-15 NOTE — ED Provider Notes (Signed)
CSN: [id number]     Arrival date & time 09/15/14  0932 History  This chart was scribed for non-physician practitioner, Raymon MuttonMarissa Connery Shiffler, PA-C, working with Shon Batonourtney F Horton, MD by Charline BillsEssence Howell, ED Scribe. This patient was seen in room TR09C/TR09C and the patient's care was started at 10:42 AM.   Chief Complaint  Patient presents with  . Neck Pain  . Arm Pain   The history is provided by the patient. No language interpreter was used.   HPI Comments: Levi Palmer is a 29 y.o. male who presents to the Emergency Department complaining of constant L-sided neck pain onset this morning. Pt states that he stretched this morning prior to onset of intense pain. He describes pain of a shooting sensation that radiates down the back of his L upper arm. He reports h/o neck pain for a few years and similar pain on the R side that lasted for a few weeks. Pt is awaiting a call from ortho for an appointment. Pt has been treating with Tylenol. He denies jaw pain, blurred vision, difficulty swallowing, numbness/tingling, pain in fingers, dizziness, HA, chest pain, SOB, difficulty breathing, fever, chills, recent falls, recent injury, heavy lifting. Pt is R hand dominant. Last CT neck was in 2015; normal.  PCP Dr. Luna GlasgowKeck   Past Medical History  Diagnosis Date  . Medical history non-contributory   . Anxiety   . Panic attack as reaction to stress   . Anemia   . Depression   . Gallstones    Past Surgical History  Procedure Laterality Date  . Cholecystectomy    . Tonsillectomy    . Incise and drain abcess    . Mouth surgery     Family History  Problem Relation Age of Onset  . Irritable bowel syndrome Mother   . Ulcerative colitis Maternal Grandfather   . Colon cancer Neg Hx   . Colon polyps Neg Hx   . Kidney disease Mother   . Gallbladder disease Neg Hx   . Esophageal cancer Neg Hx   . Diabetes Neg Hx    History  Substance Use Topics  . Smoking status: Never Smoker   . Smokeless tobacco:  Never Used  . Alcohol Use: No    Review of Systems  Constitutional: Negative for fever and chills.  HENT: Negative for trouble swallowing.   Eyes: Negative for visual disturbance.  Respiratory: Negative for shortness of breath.   Cardiovascular: Negative for chest pain.  Musculoskeletal: Positive for myalgias and neck pain.  Neurological: Negative for dizziness, numbness and headaches.   Allergies  Tramadol and Zoloft  Home Medications   Prior to Admission medications   Medication Sig Start Date End Date Taking? Authorizing Provider  cyclobenzaprine (FLEXERIL) 10 MG tablet Take 0.5 tablets (5 mg total) by mouth at bedtime. 09/15/14   Cynde Menard, PA-C  diclofenac sodium (VOLTAREN) 1 % GEL Apply 2 g topically 4 (four) times daily. 08/03/14   Ambrose FinlandValerie A Keck, NP  dicyclomine (BENTYL) 20 MG tablet Take 1 tablet (20 mg total) by mouth 2 (two) times daily. 08/07/14   Joycie PeekBenjamin Cartner, PA-C  ibuprofen (ADVIL,MOTRIN) 200 MG tablet Take 400 mg by mouth every 6 (six) hours as needed for moderate pain (pain).    Historical Provider, MD  metoCLOPramide (REGLAN) 10 MG tablet Take 1 tablet (10 mg total) by mouth every 8 (eight) hours. 08/17/14   Rachael Feeaniel P Jacobs, MD  omeprazole (PRILOSEC) 20 MG capsule Take 1 capsule (20 mg total) by mouth 2 (  two) times daily before a meal. Patient taking differently: Take 20 mg by mouth daily.  06/11/14   Ambrose Finland, NP  pantoprazole (PROTONIX) 40 MG tablet Take 1 tablet (40 mg total) by mouth daily. 07/12/14   Cathren Laine, MD  promethazine (PHENERGAN) 12.5 MG tablet Take 1 tablet (12.5 mg total) by mouth every 8 (eight) hours as needed for nausea or vomiting. 09/08/14   Ambrose Finland, NP   BP 132/73 mmHg  Pulse 90  Temp(Src) 97.8 F (36.6 C) (Oral)  Resp 18  SpO2 100% Physical Exam  Constitutional: He is oriented to person, place, and time. He appears well-developed and well-nourished. No distress.  HENT:  Head: Normocephalic and atraumatic.  Mouth/Throat:  Oropharynx is clear and moist. No oropharyngeal exudate.  Negative trismus  Eyes: Conjunctivae and EOM are normal. Pupils are equal, round, and reactive to light. Right eye exhibits no discharge. Left eye exhibits no discharge.  Neck: Muscular tenderness present.    Decreased range of motion to the neck secondary to pain Tenderness upon palpation to musculature of the neck-most discomfort upon palpation to left trapezius muscle Patient has good range of motion with passive motion to the neck-negative crepitus upon palpation  Cardiovascular: Normal rate, regular rhythm and normal heart sounds.   Pulses:      Radial pulses are 2+ on the right side, and 2+ on the left side.  Cap refill less than 3 seconds  Pulmonary/Chest: Effort normal and breath sounds normal. No respiratory distress. He has no wheezes. He has no rales.  Patient is able to speak in full sentences without difficulty Negative use of excess her muscles Negative stridor  Musculoskeletal: Normal range of motion.  Full range of motion to left upper extremity without difficulty or ataxia  Neurological: He is alert and oriented to person, place, and time. No cranial nerve deficit. He exhibits normal muscle tone. Coordination normal.  Equal grip strength bilaterally Strength 5+/5+ to upper extremities bilaterally Strength intact to MCP, PIP, DIP joints of the left hand Sensation intact with differentiation sharp and dull touch  Skin: Skin is warm and dry. No rash noted. He is not diaphoretic. No erythema.  Psychiatric: He has a normal mood and affect. His behavior is normal. Thought content normal.  Nursing note and vitals reviewed.   ED Course  Procedures (including critical care time) DIAGNOSTIC STUDIES: Oxygen Saturation is 100% on RA, normal by my interpretation.    COORDINATION OF CARE: 10:50 AM-Discussed treatment plan which includes XR and Toradol injection with pt at bedside and pt agreed to plan.   Labs  Review Labs Reviewed - No data to display  Imaging Review Dg Cervical Spine Complete  09/15/2014   CLINICAL DATA:  Left neck and shoulder pain after stretching.  EXAM: CERVICAL SPINE  4+ VIEWS  COMPARISON:  03/19/2014  FINDINGS: There is no evidence of cervical spine fracture or prevertebral soft tissue swelling. Reversal of the normal cervical lordosis, although this was also present on 03/19/2014. No other significant bone abnormalities are identified.  IMPRESSION: 1. No fracture or subluxation observed. No prevertebral soft tissue swelling. 2. Reversal of the normal cervical lordosis, similar to 03/19/2014. This could be positional or from muscle spasm.   Electronically Signed   By: Gaylyn Rong M.D.   On: 09/15/2014 11:45   Dg Shoulder Left  09/15/2014   CLINICAL DATA:  Left shoulder pain after stretching.  EXAM: LEFT SHOULDER - 2+ VIEW  COMPARISON:  None.  FINDINGS: There  is no evidence of fracture or dislocation. There is no evidence of arthropathy or other focal bone abnormality. Soft tissues are unremarkable.  IMPRESSION: Negative.   Electronically Signed   By: Gaylyn Rong M.D.   On: 09/15/2014 11:46    EKG Interpretation None      MDM   Final diagnoses:  Neck pain  Cervical strain, initial encounter    Medications  ketorolac (TORADOL) injection 60 mg (60 mg Intramuscular Given 09/15/14 1142)    Filed Vitals:   09/15/14 0945  BP: 132/73  Pulse: 90  Temp: 97.8 F (36.6 C)  TempSrc: Oral  Resp: 18  SpO2: 100%   I personally performed the services described in this documentation, which was scribed in my presence. The recorded information has been reviewed and is accurate.  Patient percent into the ED with neck pain with radiation down to the left upper arm that started this morning after patient stretched. Patient reported that when he woke up this morning he had a stretch reported that he results in this tension, pulling sensation in his neck. Worse with  motion. Stated he's been dealing with neck pain for the past couple years. Reported that he has appointment with orthopedics coming up. This provider reviewed the patient's chart. Patient was seen and assessed at [facility] care provider's office on 09/08/2014 regarding neck pain that appears to be a chronic issue and patient was given a referral to orthopedics. Patient recently had a CT cervical spine without contrast performed in 2015 with unremarkable findings. Cervical plain film no fracture subluxation identified-no prevertebral soft tissue swelling. Left shoulder no acute osseous abnormalities identified. Pain upon palpation pain with motion-suspicion to be muscular discomfort. Negative focal neurological deficits. Equal grip strength bilaterally. Pulses palpable and strong. Cap refill less than 3 seconds. Negative crepitus upon palpation. Patient stable, afebrile. Patient not septic appearing. Discharged patient. Discharge patient with pain medications and muscle relaxers. Referred patient to PCP and orthopedics. Discussed with patient to rest and stay hydrated. Discussed with patient to apply warm compressions and massage. Discussed with patient to closely monitor symptoms and if symptoms are to worsen or change to report back to the ED - strict return instructions given.  Patient agreed to plan of care, understood, all questions answered.   Raymon Mutton, PA-C 09/15/14 1220  Shon Baton, MD 09/16/14 1029

## 2014-09-15 NOTE — ED Notes (Signed)
PA to go in and talk with patient before he leaves.

## 2014-09-15 NOTE — ED Notes (Signed)
C/o neck and left arm pain . Hx of DDD, waiting to get an appointment with Ortho. Woke with increased pain in neck and left arm this am.

## 2014-09-18 ENCOUNTER — Emergency Department (HOSPITAL_COMMUNITY)
Admission: EM | Admit: 2014-09-18 | Discharge: 2014-09-18 | Disposition: A | Discharge status: Home / Self Care | Payer: Self-pay | Attending: Emergency Medicine | Admitting: Emergency Medicine

## 2014-09-18 ENCOUNTER — Encounter (HOSPITAL_COMMUNITY): Payer: Self-pay | Admitting: Emergency Medicine

## 2014-09-18 DIAGNOSIS — Z862 Personal history of diseases of the blood and blood-forming organs and certain disorders involving the immune mechanism: Secondary | ICD-10-CM | POA: Insufficient documentation

## 2014-09-18 DIAGNOSIS — M549 Dorsalgia, unspecified: Secondary | ICD-10-CM | POA: Insufficient documentation

## 2014-09-18 DIAGNOSIS — Z8719 Personal history of other diseases of the digestive system: Secondary | ICD-10-CM | POA: Insufficient documentation

## 2014-09-18 DIAGNOSIS — M542 Cervicalgia: Secondary | ICD-10-CM | POA: Insufficient documentation

## 2014-09-18 DIAGNOSIS — G8929 Other chronic pain: Secondary | ICD-10-CM | POA: Insufficient documentation

## 2014-09-18 DIAGNOSIS — Z79899 Other long term (current) drug therapy: Secondary | ICD-10-CM | POA: Insufficient documentation

## 2014-09-18 DIAGNOSIS — Z8659 Personal history of other mental and behavioral disorders: Secondary | ICD-10-CM | POA: Insufficient documentation

## 2014-09-18 DIAGNOSIS — Z791 Long term (current) use of non-steroidal anti-inflammatories (NSAID): Secondary | ICD-10-CM | POA: Insufficient documentation

## 2014-09-18 MED ORDER — MELOXICAM 7.5 MG PO TABS: 7.5000 mg | ORAL_TABLET | Freq: Every day | ORAL | Status: DC

## 2014-09-18 MED ORDER — METHOCARBAMOL 500 MG PO TABS: 500.0000 mg | ORAL_TABLET | Freq: Four times a day (QID) | ORAL | Status: DC | PRN

## 2014-09-18 NOTE — ED Provider Notes (Signed)
CSN: [id number]     Arrival date & time 09/18/14  1028 History  This chart was scribed for non-physician practitioner, Trixie DredgeEmily Farryn Linares, PA-C, working with Gerhard Munchobert Lockwood, MD by Charline BillsEssence Howell, ED Scribe. This patient was seen in room TR05C/TR05C and the patient's care was started at 11:35 AM.   Chief Complaint  Patient presents with  . Neck Pain  . Back Pain  . Leg Pain   The history is provided by the patient. No language interpreter was used.   HPI Comments: Levi Palmer is a 29 y.o. male, with a h/o anemia, anxiety, depression, panic attacks, who presents to the Emergency Department complaining of gradually worsening neck pain for the past few years, worsened over the past few days. Pt was seen in the ED on 09/15/14 for neck pain. XRs were obtained; normal. He reports that neck pain radiates down his L back and into his R upper thigh since yesterday. Pt describes back pain as a waxing and waning, cramping sensation that is worse with movement and deep breathing. He also describes a burning sensation in his R thigh that is exacerbated with touching. He denies fall, injury, heavy lifting, numbness/tingling in upper or lower extremities, HA, R hip pain, testicular pain, testicular swelling, chest pain, SOB, cough, hemoptysis, fever, abdominal pain, dysuria, urinary or bowel incontinence, hematuira, urinary  frequency, constipation. No h/o CA or IV drug use. He reports similar symptoms 2 months ago. He states that he is awaiting an appointment with ortho. Pt has been treating with Flexeril and stretching.  Past Medical History  Diagnosis Date  . Medical history non-contributory   . Anxiety   . Panic attack as reaction to stress   . Anemia   . Depression   . Gallstones    Past Surgical History  Procedure Laterality Date  . Cholecystectomy    . Tonsillectomy    . Incise and drain abcess    . Mouth surgery     Family History  Problem Relation Age of Onset  . Irritable bowel syndrome  Mother   . Ulcerative colitis Maternal Grandfather   . Colon cancer Neg Hx   . Colon polyps Neg Hx   . Kidney disease Mother   . Gallbladder disease Neg Hx   . Esophageal cancer Neg Hx   . Diabetes Neg Hx    History  Substance Use Topics  . Smoking status: Never Smoker   . Smokeless tobacco: Never Used  . Alcohol Use: No    Review of Systems  Constitutional: Negative for fever.  Respiratory: Negative for shortness of breath.   Cardiovascular: Negative for chest pain.  Gastrointestinal: Negative for abdominal pain.  Genitourinary: Negative for dysuria, frequency, hematuria and testicular pain.  Musculoskeletal: Positive for myalgias, back pain and neck pain.  Neurological: Negative for numbness and headaches.  All other systems reviewed and are negative.  Allergies  Tramadol and Zoloft  Home Medications   Prior to Admission medications   Medication Sig Start Date End Date Taking? Authorizing Provider  cyclobenzaprine (FLEXERIL) 10 MG tablet Take 0.5 tablets (5 mg total) by mouth at bedtime. 09/15/14   Marissa Sciacca, PA-C  diclofenac sodium (VOLTAREN) 1 % GEL Apply 2 g topically 4 (four) times daily. 08/03/14   Ambrose FinlandValerie A Keck, NP  dicyclomine (BENTYL) 20 MG tablet Take 1 tablet (20 mg total) by mouth 2 (two) times daily. 08/07/14   Joycie PeekBenjamin Cartner, PA-C  ibuprofen (ADVIL,MOTRIN) 200 MG tablet Take 400 mg by mouth every 6 (six)  hours as needed for moderate pain (pain).    Historical Provider, MD  metoCLOPramide (REGLAN) 10 MG tablet Take 1 tablet (10 mg total) by mouth every 8 (eight) hours. 08/17/14   Rachael Fee, MD  omeprazole (PRILOSEC) 20 MG capsule Take 1 capsule (20 mg total) by mouth 2 (two) times daily before a meal. Patient taking differently: Take 20 mg by mouth daily.  06/11/14   Ambrose Finland, NP  pantoprazole (PROTONIX) 40 MG tablet Take 1 tablet (40 mg total) by mouth daily. 07/12/14   Cathren Laine, MD  promethazine (PHENERGAN) 12.5 MG tablet Take 1 tablet (12.5  mg total) by mouth every 8 (eight) hours as needed for nausea or vomiting. 09/08/14   Ambrose Finland, NP   BP 132/84 mmHg  Pulse 115  Temp(Src) 98 F (36.7 C)  Resp 16  Wt 136 lb 14.4 oz (62.097 kg)  SpO2 98% Physical Exam  Constitutional: He appears well-developed and well-nourished. No distress.  HENT:  Head: Normocephalic and atraumatic.  Neck: Neck supple.  Pulmonary/Chest: Effort normal.  Abdominal: Soft. He exhibits no distension. There is no tenderness. There is no rebound and no guarding.  Musculoskeletal: He exhibits no edema.  Diffuse tenderness throughout L back. Spine without crepitus or step offs. No overlying skin changes.  R hip: nontender. No edema. No skin changes of the thigh. Normal ROM.  Lower extremities:  Strength 5/5, sensation intact, distal pulses intact.     Neurological: He is alert.  Skin: He is not diaphoretic.  Nursing note and vitals reviewed.  ED Course  Procedures (including critical care time) DIAGNOSTIC STUDIES: Oxygen Saturation is 98% on RA, normal by my interpretation.    COORDINATION OF CARE: 11:43 AM-Discussed treatment plan which includes Robaxin and Mobic with pt at bedside and pt agreed to plan.   Labs Review Labs Reviewed - No data to display  Imaging Review No results found.   EKG Interpretation None      MDM   Final diagnoses:  Chronic neck pain  Left-sided back pain, unspecified location    Afebrile, nontoxic patient with exacerbation of chronic back pain.  No red flags with history or exam.  Neurovascularly intact.  Emergent imaging not indicated at this time.   D/C home with robaxin, mobic,PCP follow up.  Discussed result, findings, treatment, and follow up  with patient.  Pt given return precautions.  Pt verbalizes understanding and agrees with plan.        I personally performed the services described in this documentation, which was scribed in my presence. The recorded information has been reviewed and is accurate.    Trixie Dredge, PA-C 09/18/14 1217  Gerhard Munch, MD 09/18/14 [phone]

## 2014-09-18 NOTE — ED Notes (Signed)
Pt was seen here Friday for neck pain-- was told to come back if no better-- now has pain radiating down to leg-- pt appears to be in pain, holding breath,

## 2014-09-18 NOTE — ED Notes (Signed)
Pt was seen here 09/15/14 for c/o neck pain. Returns today for worsening pain to neck and "burning" pain to right thigh. States he has tried to get appointment with Ortho but has been unable to.

## 2014-09-18 NOTE — Discharge Instructions (Signed)
Read the information below.  Use the prescribed medication as directed.  Please discuss all new medications with your pharmacist.  You may return to the Emergency Department at any time for worsening condition or any new symptoms that concern you.    If you develop fevers, loss of control of bowel or bladder, weakness or numbness in your legs, or are unable to walk, return to the ER for a recheck.    Back Exercises Back exercises help treat and prevent back injuries. The goal of back exercises is to increase the strength of your abdominal and back muscles and the flexibility of your back. These exercises should be started when you no longer have back pain. Back exercises include:  Pelvic Tilt. Lie on your back with your knees bent. Tilt your pelvis until the lower part of your back is against the floor. Hold this position 5 to 10 sec and repeat 5 to 10 times.  Knee to Chest. Pull first 1 knee up against your chest and hold for 20 to 30 seconds, repeat this with the other knee, and then both knees. This may be done with the other leg straight or bent, whichever feels better.  Sit-Ups or Curl-Ups. Bend your knees 90 degrees. Start with tilting your pelvis, and do a partial, slow sit-up, lifting your trunk only 30 to 45 degrees off the floor. Take at least 2 to 3 seconds for each sit-up. Do not do sit-ups with your knees out straight. If partial sit-ups are difficult, simply do the above but with only tightening your abdominal muscles and holding it as directed.  Hip-Lift. Lie on your back with your knees flexed 90 degrees. Push down with your feet and shoulders as you raise your hips a couple inches off the floor; hold for 10 seconds, repeat 5 to 10 times.  Back arches. Lie on your stomach, propping yourself up on bent elbows. Slowly press on your hands, causing an arch in your low back. Repeat 3 to 5 times. Any initial stiffness and discomfort should lessen with repetition over time.  Shoulder-Lifts.  Lie face down with arms beside your body. Keep hips and torso pressed to floor as you slowly lift your head and shoulders off the floor. Do not overdo your exercises, especially in the beginning. Exercises may cause you some mild back discomfort which lasts for a few minutes; however, if the pain is more severe, or lasts for more than 15 minutes, do not continue exercises until you see your caregiver. Improvement with exercise therapy for back problems is slow.  See your caregivers for assistance with developing a proper back exercise program. Document Released: 07/31/2004 Document Revised: 09/15/2011 Document Reviewed: 04/24/2011 [facility] Patient Information 2015 [place] [place]. This information is not intended to replace advice given to you by your health care provider. Make sure you discuss any questions you have with your health care provider.  Back Pain, Adult Low back pain is very common. About 1 in 5 people have back pain.The cause of low back pain is rarely dangerous. The pain often gets better over time.About half of people with a sudden onset of back pain feel better in just 2 weeks. About 8 in 10 people feel better by 6 weeks.  CAUSES Some common causes of back pain include:  Strain of the muscles or ligaments supporting the spine.  Wear and tear (degeneration) of the spinal discs.  Arthritis.  Direct injury to the back. DIAGNOSIS Most of the time, the direct cause of  low back pain is not known.However, back pain can be treated effectively even when the exact cause of the pain is unknown.Answering your caregiver's questions about your overall health and symptoms is one of the most accurate ways to make sure the cause of your pain is not dangerous. If your caregiver needs more information, he or she may order lab work or imaging tests (X-rays or MRIs).However, even if imaging tests show changes in your back, this usually does not require surgery. HOME CARE INSTRUCTIONS For many  people, back pain returns.Since low back pain is rarely dangerous, it is often a condition that people can learn to [facility] their own.   Remain active. It is stressful on the back to sit or stand in one place. Do not sit, drive, or stand in one place for more than 30 minutes at a time. Take short walks on level surfaces as soon as pain allows.Try to increase the length of time you walk each day.  Do not stay in bed.Resting more than 1 or 2 days can delay your recovery.  Do not avoid exercise or work.Your body is made to move.It is not dangerous to be active, even though your back may hurt.Your back will likely heal faster if you return to being active before your pain is gone.  Pay attention to your body when you bend and lift. Many people have less discomfortwhen lifting if they bend their knees, keep the load close to their bodies,and avoid twisting. Often, the most comfortable positions are those that put less stress on your recovering back.  Find a comfortable position to sleep. Use a firm mattress and lie on your side with your knees slightly bent. If you lie on your back, put a pillow under your knees.  Only take over-the-counter or prescription medicines as directed by your caregiver. Over-the-counter medicines to reduce pain and inflammation are often the most helpful.Your caregiver may prescribe muscle relaxant drugs.These medicines help dull your pain so you can more quickly return to your normal activities and healthy exercise.  Put ice on the injured area.  Put ice in a plastic bag.  Place a towel between your skin and the bag.  Leave the ice on for 15-20 minutes, 03-04 times a day for the first 2 to 3 days. After that, ice and heat may be alternated to reduce pain and spasms.  Ask your caregiver about trying back exercises and gentle massage. This may be of some benefit.  Avoid feeling anxious or stressed.Stress increases muscle tension and can worsen back pain.It  is important to recognize when you are anxious or stressed and learn ways to manage it.Exercise is a great option. SEEK MEDICAL CARE IF:  You have pain that is not relieved with rest or medicine.  You have pain that does not improve in 1 week.  You have new symptoms.  You are generally not feeling well. SEEK IMMEDIATE MEDICAL CARE IF:   You have pain that radiates from your back into your legs.  You develop new bowel or bladder control problems.  You have unusual weakness or numbness in your arms or legs.  You develop nausea or vomiting.  You develop abdominal pain.  You feel faint. Document Released: 06/23/2005 Document Revised: 12/23/2011 Document Reviewed: 10/25/2013 [facility] Patient Information 2015 [place], [place]. This information is not intended to replace advice given to you by your health care provider. Make sure you discuss any questions you have with your health care provider.  Chronic Back Pain  When back pain lasts longer than 3 months, it is called chronic back pain.People with chronic back pain often go through certain periods that are more intense (flare-ups).  CAUSES Chronic back pain can be caused by wear and tear (degeneration) on different structures in your back. These structures include:  The bones of your spine (vertebrae) and the joints surrounding your spinal cord and nerve roots (facets).  The strong, fibrous tissues that connect your vertebrae (ligaments). Degeneration of these structures may result in pressure on your nerves. This can lead to constant pain. HOME CARE INSTRUCTIONS  Avoid bending, heavy lifting, prolonged sitting, and activities which make the problem worse.  Take brief periods of rest throughout the day to reduce your pain. Lying down or standing usually is better than sitting while you are resting.  Take over-the-counter or prescription medicines only as directed by your caregiver. SEEK IMMEDIATE MEDICAL CARE IF:   You have  weakness or numbness in one of your legs or feet.  You have trouble controlling your bladder or bowels.  You have nausea, vomiting, abdominal pain, shortness of breath, or fainting. Document Released: 07/31/2004 Document Revised: 09/15/2011 Document Reviewed: 06/07/2011 [facility] Patient Information 2015 [place], [place]. This information is not intended to replace advice given to you by your health care provider. Make sure you discuss any questions you have with your health care provider.

## 2014-10-10 ENCOUNTER — Telehealth: Payer: Self-pay | Admitting: *Deleted

## 2014-10-10 ENCOUNTER — Encounter: Payer: Self-pay | Admitting: Internal Medicine

## 2014-10-10 ENCOUNTER — Ambulatory Visit: Payer: Self-pay | Attending: Internal Medicine | Admitting: Internal Medicine

## 2014-10-10 VITALS — BP 186/127 | HR 114 | Temp 98.4°F | Resp 16 | Ht 66.0 in | Wt 143.0 lb

## 2014-10-10 DIAGNOSIS — Z590 Homelessness: Secondary | ICD-10-CM | POA: Insufficient documentation

## 2014-10-10 DIAGNOSIS — R4689 Other symptoms and signs involving appearance and behavior: Secondary | ICD-10-CM

## 2014-10-10 DIAGNOSIS — R109 Unspecified abdominal pain: Secondary | ICD-10-CM | POA: Insufficient documentation

## 2014-10-10 DIAGNOSIS — G8929 Other chronic pain: Secondary | ICD-10-CM | POA: Insufficient documentation
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
## 2014-10-10 NOTE — Telephone Encounter (Signed)
Patient called my number complaining that nobody will help him.  He states he has been to the ED multiple times about his abdominal pain and that nobody will listen to him and everyone "talks down to him."  He had an appointment to see Holland CommonsValerie Keck today and according to her became verbally abusive to the point where she felt uncomfortable.  Practice manager was called as well as security and patient ended up leaving the building.  He later called my number and spent 10 minutes explaining to me that nobody would help him.  I asked what it is that I could do to help him and he asked if he could change who his PCP was.  I told him I would speak to the practice manager tomorrow to see what we could do and I would call him tomorrow.  Patient appreciative and in agreement with the plan.

## 2014-10-10 NOTE — Progress Notes (Signed)
Pt is here following up on his chronic pain. Pt states that he feels like he is getting no where with his health care. [facility] but he does have an appointment on Thursday.

## 2014-10-12 ENCOUNTER — Telehealth: Payer: Self-pay | Admitting: Internal Medicine

## 2014-10-12 NOTE — Telephone Encounter (Signed)
Patient has come in today to pick up medications because he says he was told to do so via from from our pharmacy; patient presented to the pharmacy and medications were not there; patient is confused and would like some clarification on wether or not he has refills or not; please f/u with patient in the lobby

## 2014-10-13 ENCOUNTER — Telehealth: Payer: Self-pay | Admitting: *Deleted
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
## 2014-10-18 ENCOUNTER — Ambulatory Visit: Payer: Self-pay | Admitting: Internal Medicine

## 2014-10-26 ENCOUNTER — Ambulatory Visit: Payer: Self-pay | Attending: Internal Medicine | Admitting: Internal Medicine

## 2014-10-26 ENCOUNTER — Encounter: Payer: Self-pay | Admitting: Internal Medicine

## 2014-10-26 VITALS — BP 148/97 | HR 85 | Temp 98.3°F | Resp 16 | Wt 145.0 lb

## 2014-10-26 DIAGNOSIS — R030 Elevated blood-pressure reading, without diagnosis of hypertension: Secondary | ICD-10-CM

## 2014-10-26 DIAGNOSIS — R112 Nausea with vomiting, unspecified: Secondary | ICD-10-CM

## 2014-10-26 DIAGNOSIS — M542 Cervicalgia: Secondary | ICD-10-CM

## 2014-10-26 DIAGNOSIS — G8929 Other chronic pain: Secondary | ICD-10-CM

## 2014-10-26 DIAGNOSIS — Z8619 Personal history of other infectious and parasitic diseases: Secondary | ICD-10-CM

## 2014-10-26 DIAGNOSIS — R1013 Epigastric pain: Secondary | ICD-10-CM

## 2014-10-26 DIAGNOSIS — IMO0001 Reserved for inherently not codable concepts without codable children: Secondary | ICD-10-CM

## 2014-10-26 MED ORDER — PROMETHAZINE HCL 12.5 MG PO TABS
12.5000 mg | ORAL_TABLET | Freq: Three times a day (TID) | ORAL | Status: DC | PRN
Start: 2014-10-26 — End: 2014-11-07

## 2014-10-26 MED ORDER — PANTOPRAZOLE SODIUM 40 MG PO TBEC: 40.0000 mg | DELAYED_RELEASE_TABLET | Freq: Every day | ORAL | Status: DC

## 2014-10-26 NOTE — Progress Notes (Signed)
Patient complains of neck pain that is ongoing for several years Patient is in constant pain Patient complains  Of upper right quadrant pain but states it is not his gall bladder  Because it has been removed Patient is having some nausea and vomiting associated with the pain Patient states "he just wants some help"

## 2014-10-26 NOTE — Patient Instructions (Signed)
DASH Eating Plan °DASH stands for "Dietary Approaches to Stop Hypertension." The DASH eating plan is a healthy eating plan that has been shown to reduce high blood pressure (hypertension). Additional health benefits may include reducing the risk of type 2 diabetes mellitus, heart disease, and stroke. The DASH eating plan may also help with weight loss. °WHAT DO I NEED TO KNOW ABOUT THE DASH EATING PLAN? °For the DASH eating plan, you will follow these general guidelines: °· Choose foods with a percent daily value for sodium of less than 5% (as listed on the food label). °· Use salt-free seasonings or herbs instead of table salt or sea salt. °· Check with your health care provider or pharmacist before using salt substitutes. °· Eat lower-sodium products, often labeled as "lower sodium" or "no salt added." °· Eat fresh foods. °· Eat more vegetables, fruits, and low-fat dairy products. °· Choose whole grains. Look for the word "whole" as the first word in the ingredient list. °· Choose fish and skinless chicken or turkey more often than red meat. Limit fish, poultry, and meat to 6 oz (170 g) each day. °· Limit sweets, desserts, sugars, and sugary drinks. °· Choose heart-healthy fats. °· Limit cheese to 1 oz (28 g) per day. °· Eat more home-cooked food and less restaurant, buffet, and fast food. °· Limit fried foods. °· Cook foods using methods other than frying. °· Limit canned vegetables. If you do use them, rinse them well to decrease the sodium. °· When eating at a restaurant, ask that your food be prepared with less salt, or no salt if possible. °WHAT FOODS CAN I EAT? °Seek help from a dietitian for individual calorie needs. °Grains °Whole grain or whole wheat bread. Brown rice. Whole grain or whole wheat pasta. Quinoa, bulgur, and whole grain cereals. Low-sodium cereals. Corn or whole wheat flour tortillas. Whole grain cornbread. Whole grain crackers. Low-sodium crackers. °Vegetables °Fresh or frozen vegetables  (raw, steamed, roasted, or grilled). Low-sodium or reduced-sodium tomato and vegetable juices. Low-sodium or reduced-sodium tomato sauce and paste. Low-sodium or reduced-sodium canned vegetables.  °Fruits °All fresh, canned (in natural juice), or frozen fruits. °Meat and Other Protein Products °Ground beef (85% or leaner), grass-fed beef, or beef trimmed of fat. Skinless chicken or turkey. Ground chicken or turkey. Pork trimmed of fat. All fish and seafood. Eggs. Dried beans, peas, or lentils. Unsalted nuts and seeds. Unsalted canned beans. °Dairy °Low-fat dairy products, such as skim or 1% milk, 2% or reduced-fat cheeses, low-fat ricotta or cottage cheese, or plain low-fat yogurt. Low-sodium or reduced-sodium cheeses. °Fats and Oils °Tub margarines without trans fats. Light or reduced-fat mayonnaise and salad dressings (reduced sodium). Avocado. Safflower, olive, or canola oils. Natural peanut or almond butter. °Other °Unsalted popcorn and pretzels. °The items listed above may not be a complete list of recommended foods or beverages. Contact your dietitian for more options. °WHAT FOODS ARE NOT RECOMMENDED? °Grains °White bread. White pasta. White rice. Refined cornbread. Bagels and croissants. Crackers that contain trans fat. °Vegetables °Creamed or fried vegetables. Vegetables in a cheese sauce. Regular canned vegetables. Regular canned tomato sauce and paste. Regular tomato and vegetable juices. °Fruits °Dried fruits. Canned fruit in light or heavy syrup. Fruit juice. °Meat and Other Protein Products °Fatty cuts of meat. Ribs, chicken wings, bacon, sausage, bologna, salami, chitterlings, fatback, hot dogs, bratwurst, and packaged luncheon meats. Salted nuts and seeds. Canned beans with salt. °Dairy °Whole or 2% milk, cream, half-and-half, and cream cheese. Whole-fat or sweetened yogurt. Full-fat   cheeses or blue cheese. Nondairy creamers and whipped toppings. Processed cheese, cheese spreads, or cheese  curds. °Condiments °Onion and garlic salt, seasoned salt, table salt, and sea salt. Canned and packaged gravies. Worcestershire sauce. Tartar sauce. Barbecue sauce. Teriyaki sauce. Soy sauce, including reduced sodium. Steak sauce. Fish sauce. Oyster sauce. Cocktail sauce. Horseradish. Ketchup and mustard. Meat flavorings and tenderizers. Bouillon cubes. Hot sauce. Tabasco sauce. Marinades. Taco seasonings. Relishes. °Fats and Oils °Butter, stick margarine, lard, shortening, ghee, and bacon fat. Coconut, palm kernel, or palm oils. Regular salad dressings. °Other °Pickles and olives. Salted popcorn and pretzels. °The items listed above may not be a complete list of foods and beverages to avoid. Contact your dietitian for more information. °WHERE CAN I FIND MORE INFORMATION? °National Heart, Lung, and Blood Institute: www.nhlbi.nih.gov/health/health-topics/topics/dash/ °Document Released: 06/12/2011 Document Revised: 11/07/2013 Document Reviewed: 04/27/2013 °ExitCare® Patient Information ©2015 [employer], [employer]. This information is not intended to replace advice given to you by your health care provider. Make sure you discuss any questions you have with your health care provider. ° °

## 2014-10-26 NOTE — Progress Notes (Signed)
MRN: [id number] Name: Levi Palmer  Sex: male Age: 29 y.o. DOB: [birth year]  Allergies: Tramadol and Zoloft  Chief Complaint  Patient presents with  . Neck Pain    HPI: Patient is 29 y.o. male who history of chronic abdominal pain, nausea vomiting, patient has history of cholecystectomy, he has followed up with the GI and had MRI done which was negative for any CBD stones, patient also was diagnosed with H. Pylori as per patient he completed and finished 2 week the course, he also complains of chronic neck pain and has already followed her with orthopedics and was recommended to get physical therapy patient has been prescribed muscle relaxant. Patient currently consume marijuana, denies drinking alcohol. Patient then out of his Protonix and nausea medication.  Past Medical History  Diagnosis Date  . Medical history non-contributory   . Anxiety   . Panic attack as reaction to stress   . Anemia   . Depression   . Gallstones     Past Surgical History  Procedure Laterality Date  . Cholecystectomy    . Tonsillectomy    . Incise and drain abcess    . Mouth surgery        Medication List       This list is accurate as of: 10/26/14  5:14 PM.  Always use your most recent med list.               diclofenac sodium 1 % Gel  Commonly known as:  VOLTAREN  Apply 2 g topically 4 (four) times daily.     dicyclomine 20 MG tablet  Commonly known as:  BENTYL  Take 1 tablet (20 mg total) by mouth 2 (two) times daily.     ibuprofen 200 MG tablet  Commonly known as:  ADVIL,MOTRIN  Take 400 mg by mouth every 6 (six) hours as needed for moderate pain (pain).     meloxicam 7.5 MG tablet  Commonly known as:  MOBIC  Take 1 tablet (7.5 mg total) by mouth daily.     methocarbamol 500 MG tablet  Commonly known as:  ROBAXIN  Take 1 tablet (500 mg total) by mouth every 6 (six) hours as needed for muscle spasms (and pain).     metoCLOPramide 10 MG tablet  Commonly known as:   REGLAN  Take 1 tablet (10 mg total) by mouth every 8 (eight) hours.     omeprazole 20 MG capsule  Commonly known as:  PRILOSEC  Take 1 capsule (20 mg total) by mouth 2 (two) times daily before a meal.     pantoprazole 40 MG tablet  Commonly known as:  PROTONIX  Take 1 tablet (40 mg total) by mouth daily.     promethazine 12.5 MG tablet  Commonly known as:  PHENERGAN  Take 1 tablet (12.5 mg total) by mouth every 8 (eight) hours as needed for nausea or vomiting.        Meds ordered this encounter  Medications  . pantoprazole (PROTONIX) 40 MG tablet    Sig: Take 1 tablet (40 mg total) by mouth daily.    Dispense:  30 tablet    Refill:  3  . promethazine (PHENERGAN) 12.5 MG tablet    Sig: Take 1 tablet (12.5 mg total) by mouth every 8 (eight) hours as needed for nausea or vomiting.    Dispense:  30 tablet    Refill:  0     There is no immunization history on file for  this patient.  Family History  Problem Relation Age of Onset  . Irritable bowel syndrome Mother   . Ulcerative colitis Maternal Grandfather   . Colon cancer Neg Hx   . Colon polyps Neg Hx   . Kidney disease Mother   . Gallbladder disease Neg Hx   . Esophageal cancer Neg Hx   . Diabetes Neg Hx     History  Substance Use Topics  . Smoking status: Never Smoker   . Smokeless tobacco: Never Used  . Alcohol Use: No    Review of Systems   As noted in HPI  Filed Vitals:   10/26/14 1620  BP: 148/97  Pulse: 85  Temp: 98.3 F (36.8 C)  Resp: 16    Physical Exam  Physical Exam  Constitutional: No distress.  Eyes: EOM are normal. Pupils are equal, round, and reactive to light.  Neck: Neck supple.  Minimal limitation in side to side movement on the neck  Cardiovascular: Normal rate and regular rhythm.   Pulmonary/Chest: Breath sounds normal. No respiratory distress. He has no wheezes. He has no rales.  Abdominal:  Minimal suprapubic tenderness with deep palpation, no rebound or guarding, bowel  sounds positive.    CBC    Component Value Date/Time   WBC 11.2* 08/07/2014 1610   RBC 5.15 08/07/2014 1610   HGB 11.8* 08/07/2014 1610   HCT 37.8* 08/07/2014 1610   PLT 361 08/07/2014 1610   MCV 73.4* 08/07/2014 1610   LYMPHSABS 1.5 07/27/2014 0754   MONOABS 0.5 07/27/2014 0754   EOSABS 0.1 07/27/2014 0754   BASOSABS 0.0 07/27/2014 0754    CMP     Component Value Date/Time   NA 140 08/07/2014 1610   K 3.8 08/07/2014 1610   CL 105 08/07/2014 1610   CO2 25 08/07/2014 1610   GLUCOSE 100* 08/07/2014 1610   BUN 16 08/07/2014 1610   CREATININE 0.80 08/07/2014 1610   CREATININE 0.88 08/19/2013 1152   CALCIUM 10.0 08/07/2014 1610   PROT 7.8 07/27/2014 0754   ALBUMIN 4.2 07/27/2014 0754   AST 16 07/27/2014 0754   ALT 11 07/27/2014 0754   ALKPHOS 71 07/27/2014 0754   BILITOT 0.5 07/27/2014 0754   GFRNONAA >90 08/07/2014 1610   GFRNONAA >89 08/19/2013 1152   GFRAA >90 08/07/2014 1610   GFRAA >89 08/19/2013 1152    Lab Results  Component Value Date/Time   CHOL 142 08/19/2013 11:52 AM    No results found for: HGBA1C  Lab Results  Component Value Date/Time   AST 16 07/27/2014 07:54 AM    Assessment and Plan  Neck pain Likely muscle spasm, patient has already been prescribed muscle relaxant by his orthopedic Dr., has been recommended to get physical therapy done.  Abdominal pain, chronic, epigastric - Plan: patient was treated in the past for 2 weeks,I have ordered  Helicobacter pylori antigen det, stool, resume back on pantoprazole (PROTONIX) 40 MG tablet  Elevated BP Advised  patient for DASH diet.  History of Helicobacter pylori infection - Plan: Helicobacter pylori antigen det, stool  Non-intractable vomiting with nausea, vomiting of unspecified type - Plan: promethazine (PHENERGAN) 12.5 MG tablet  a  Return in about 3 months (around 01/25/2015), or if symptoms worsen or fail to improve.   This note has been created with Stage manager. Any transcriptional errors are unintentional.    Doris Cheadle, MD

## 2014-11-01 ENCOUNTER — Other Ambulatory Visit: Payer: Self-pay | Admitting: Internal Medicine

## 2014-11-04 LAB — HELICOBACTER PYLORI  SPECIAL ANTIGEN: H. PYLORI Antigen: NEGATIVE

## 2014-11-07 ENCOUNTER — Other Ambulatory Visit: Payer: Self-pay | Admitting: *Deleted

## 2014-11-07 DIAGNOSIS — R112 Nausea with vomiting, unspecified: Secondary | ICD-10-CM

## 2014-11-07 MED ORDER — PROMETHAZINE HCL 12.5 MG PO TABS: 12.5000 mg | ORAL_TABLET | Freq: Three times a day (TID) | ORAL | Status: DC | PRN

## 2014-11-09 ENCOUNTER — Encounter (HOSPITAL_COMMUNITY): Payer: Self-pay | Admitting: Emergency Medicine

## 2014-11-09 ENCOUNTER — Emergency Department (HOSPITAL_COMMUNITY)
Admission: EM | Admit: 2014-11-09 | Discharge: 2014-11-09 | Disposition: A | Discharge status: Home / Self Care | Payer: Self-pay | Attending: Emergency Medicine | Admitting: Emergency Medicine

## 2014-11-09 ENCOUNTER — Ambulatory Visit: Payer: Self-pay

## 2014-11-09 DIAGNOSIS — R1011 Right upper quadrant pain: Secondary | ICD-10-CM

## 2014-11-09 DIAGNOSIS — R1084 Generalized abdominal pain: Secondary | ICD-10-CM | POA: Insufficient documentation

## 2014-11-09 DIAGNOSIS — R112 Nausea with vomiting, unspecified: Secondary | ICD-10-CM | POA: Insufficient documentation

## 2014-11-09 DIAGNOSIS — Z9089 Acquired absence of other organs: Secondary | ICD-10-CM | POA: Insufficient documentation

## 2014-11-09 DIAGNOSIS — Z862 Personal history of diseases of the blood and blood-forming organs and certain disorders involving the immune mechanism: Secondary | ICD-10-CM | POA: Insufficient documentation

## 2014-11-09 DIAGNOSIS — Z791 Long term (current) use of non-steroidal anti-inflammatories (NSAID): Secondary | ICD-10-CM | POA: Insufficient documentation

## 2014-11-09 DIAGNOSIS — Z8659 Personal history of other mental and behavioral disorders: Secondary | ICD-10-CM | POA: Insufficient documentation

## 2014-11-09 DIAGNOSIS — Z79899 Other long term (current) drug therapy: Secondary | ICD-10-CM | POA: Insufficient documentation

## 2014-11-09 DIAGNOSIS — G8929 Other chronic pain: Secondary | ICD-10-CM | POA: Insufficient documentation

## 2014-11-09 DIAGNOSIS — Z8719 Personal history of other diseases of the digestive system: Secondary | ICD-10-CM | POA: Insufficient documentation

## 2014-11-09 DIAGNOSIS — R197 Diarrhea, unspecified: Secondary | ICD-10-CM | POA: Insufficient documentation

## 2014-11-09 LAB — CBC WITH DIFFERENTIAL/PLATELET
BASOS ABS: 0 10*3/uL (ref 0.0–0.1)
Basophils Relative: 0 % (ref 0–1)
Eosinophils Absolute: 0.1 10*3/uL (ref 0.0–0.7)
Eosinophils Relative: 1 % (ref 0–5)
HCT: 39.4 % (ref 39.0–52.0)
Hemoglobin: 12.5 g/dL — ABNORMAL LOW (ref 13.0–17.0)
Lymphocytes Relative: 15 % (ref 12–46)
Lymphs Abs: 1.6 10*3/uL (ref 0.7–4.0)
MCH: 23.4 pg — ABNORMAL LOW (ref 26.0–34.0)
MCHC: 31.7 g/dL (ref 30.0–36.0)
MCV: 73.8 fL — ABNORMAL LOW (ref 78.0–100.0)
MONO ABS: 0.5 10*3/uL (ref 0.1–1.0)
Monocytes Relative: 5 % (ref 3–12)
NEUTROS PCT: 79 % — AB (ref 43–77)
Neutro Abs: 8.6 10*3/uL — ABNORMAL HIGH (ref 1.7–7.7)
Platelets: 365 10*3/uL (ref 150–400)
RBC: 5.34 MIL/uL (ref 4.22–5.81)
RDW: 18.9 % — AB (ref 11.5–15.5)
WBC: 10.8 10*3/uL — ABNORMAL HIGH (ref 4.0–10.5)

## 2014-11-09 LAB — COMPREHENSIVE METABOLIC PANEL
ALT: 11 U/L — AB (ref 17–63)
AST: 17 U/L (ref 15–41)
Albumin: 4.7 g/dL (ref 3.5–5.0)
Alkaline Phosphatase: 70 U/L (ref 38–126)
Anion gap: 3 — ABNORMAL LOW (ref 5–15)
BUN: 16 mg/dL (ref 6–20)
CHLORIDE: 106 mmol/L (ref 101–111)
CO2: 27 mmol/L (ref 22–32)
Calcium: 9.2 mg/dL (ref 8.9–10.3)
Creatinine, Ser: 0.99 mg/dL (ref 0.61–1.24)
GLUCOSE: 113 mg/dL — AB (ref 70–99)
Potassium: 3.9 mmol/L (ref 3.5–5.1)
Sodium: 136 mmol/L (ref 135–145)
Total Bilirubin: 0.5 mg/dL (ref 0.3–1.2)
Total Protein: 8.4 g/dL — ABNORMAL HIGH (ref 6.5–8.1)

## 2014-11-09 LAB — URINALYSIS, ROUTINE W REFLEX MICROSCOPIC
Bilirubin Urine: NEGATIVE
Glucose, UA: NEGATIVE mg/dL
Hgb urine dipstick: NEGATIVE
KETONES UR: NEGATIVE mg/dL
Leukocytes, UA: NEGATIVE
Nitrite: NEGATIVE
Protein, ur: NEGATIVE mg/dL
Specific Gravity, Urine: 1.019 (ref 1.005–1.030)
Urobilinogen, UA: 0.2 mg/dL (ref 0.0–1.0)
pH: 7.5 (ref 5.0–8.0)

## 2014-11-09 LAB — LIPASE, BLOOD: Lipase: 20 U/L — ABNORMAL LOW (ref 22–51)

## 2014-11-09 MED ORDER — SODIUM CHLORIDE 0.9 % IV BOLUS (SEPSIS)
1000.0000 mL | Freq: Once | INTRAVENOUS | Status: AC
  Administered 2014-11-09: 1000 mL via INTRAVENOUS

## 2014-11-09 MED ORDER — KETOROLAC TROMETHAMINE 30 MG/ML IJ SOLN
30.0000 mg | Freq: Once | INTRAMUSCULAR | Status: AC
  Administered 2014-11-09: 30 mg via INTRAVENOUS
  Filled 2014-11-09: qty 1

## 2014-11-09 MED ORDER — METOCLOPRAMIDE HCL 5 MG/ML IJ SOLN
10.0000 mg | Freq: Once | INTRAMUSCULAR | Status: DC
  Filled 2014-11-09: qty 2

## 2014-11-09 MED ORDER — ONDANSETRON HCL 4 MG/2ML IJ SOLN
4.0000 mg | Freq: Once | INTRAMUSCULAR | Status: AC
  Administered 2014-11-09: 4 mg via INTRAVENOUS
  Filled 2014-11-09: qty 2

## 2014-11-09 NOTE — ED Notes (Signed)
Per pt, chronic abdominal pain-started having pain and vomiting 3 days ago

## 2014-11-09 NOTE — ED Notes (Signed)
Asked pt for urine sample pt states he is unable to provide on at this time.

## 2014-11-09 NOTE — Discharge Instructions (Signed)
Abdominal Pain Many things can cause abdominal pain. Usually, abdominal pain is not caused by a disease and will improve without treatment. It can often be observed and treated at home. Your health care provider will do a physical exam and possibly order blood tests and X-rays to help determine the seriousness of your pain. However, in many cases, more time must pass before a clear cause of the pain can be found. Before that point, your health care provider may not know if you need more testing or further treatment. HOME CARE INSTRUCTIONS  Monitor your abdominal pain for any changes. The following actions may help to alleviate any discomfort you are experiencing:  Only take over-the-counter or prescription medicines as directed by your health care provider.  Do not take laxatives unless directed to do so by your health care provider.  Try a clear liquid diet (broth, tea, or water) as directed by your health care provider. Slowly move to a bland diet as tolerated. SEEK MEDICAL CARE IF:  You have unexplained abdominal pain.  You have abdominal pain associated with nausea or diarrhea.  You have pain when you urinate or have a bowel movement.  You experience abdominal pain that wakes you in the night.  You have abdominal pain that is worsened or improved by eating food.  You have abdominal pain that is worsened with eating fatty foods.  You have a fever. SEEK IMMEDIATE MEDICAL CARE IF:   Your pain does not go away within 2 hours.  You keep throwing up (vomiting).  Your pain is felt only in portions of the abdomen, such as the right side or the left lower portion of the abdomen.  You pass bloody or black tarry stools. MAKE SURE YOU:  Understand these instructions.   Will watch your condition.   Will get help right away if you are not doing well or get worse.  Document Released: 04/02/2005 Document Revised: 06/28/2013 Document Reviewed: 03/02/2013 [facility] Patient Information  2015 [place] [place]. This information is not intended to replace advice given to you by your health care provider. Make sure you discuss any questions you have with your health care provider. You were evaluated in the ED today for your abdominal discomfort. There does not appear to be an emergent cause her symptoms at this time. Your labs and exam were reassuring today. Please follow-up with your primary care and/or gastroenterology for further evaluation and management of your symptoms.

## 2014-11-09 NOTE — ED Provider Notes (Signed)
CSN: [id number]     Arrival date & time 11/09/14  [id number] History   First MD Initiated Contact with Patient 11/09/14 1003     Chief Complaint  Patient presents with  . Abdominal Pain     (Consider location/radiation/quality/duration/timing/severity/associated sxs/prior Treatment) HPI Levi Palmer is a 29 y.o. male with a history of chronic neck pain, chronic abdominal pain comes in for evaluation of acute abdominal pain. Patient states for the past 3 days he has had an exacerbation of his chronic abdominal pain in his epigastrium and right upper quadrant. He reports this discomfort usually brought on when he tries to have a bowel movement. It is relieved by laying flat on his stomach. He reports associated nausea with intermittent, nonbloody emesis. Also reports intermittent loose stools. Characterizes the pain as "my whole body is tightening in on itself". He rates his discomfort as a 9/10. He has tried Phenergan at home without relief of his nausea. Denies fevers, chills, urinary symptoms, bloody or dark stools.  Past Medical History  Diagnosis Date  . Medical history non-contributory   . Anxiety   . Panic attack as reaction to stress   . Anemia   . Depression   . Gallstones    Past Surgical History  Procedure Laterality Date  . Cholecystectomy    . Tonsillectomy    . Incise and drain abcess    . Mouth surgery     Family History  Problem Relation Age of Onset  . Irritable bowel syndrome Mother   . Ulcerative colitis Maternal Grandfather   . Colon cancer Neg Hx   . Colon polyps Neg Hx   . Kidney disease Mother   . Gallbladder disease Neg Hx   . Esophageal cancer Neg Hx   . Diabetes Neg Hx    History  Substance Use Topics  . Smoking status: Never Smoker   . Smokeless tobacco: Never Used  . Alcohol Use: No    Review of Systems A 10 point review of systems was completed and was negative except for pertinent positives and negatives as mentioned in the history of present  illness     Allergies  Tramadol and Zoloft  Home Medications   Prior to Admission medications   Medication Sig Start Date End Date Taking? Authorizing Provider  doxylamine, Sleep, (UNISOM) 25 MG tablet Take 25 mg by mouth at bedtime as needed for sleep.   Yes Historical Provider, MD  pantoprazole (PROTONIX) 40 MG tablet Take 1 tablet (40 mg total) by mouth daily. 10/26/14  Yes Doris Cheadleeepak Advani, MD  promethazine (PHENERGAN) 12.5 MG tablet Take 1 tablet (12.5 mg total) by mouth every 8 (eight) hours as needed for nausea or vomiting. 11/07/14  Yes Doris Cheadleeepak Advani, MD  diclofenac sodium (VOLTAREN) 1 % GEL Apply 2 g topically 4 (four) times daily. Patient not taking: Reported on 10/10/2014 08/03/14   Ambrose FinlandValerie A Keck, NP  dicyclomine (BENTYL) 20 MG tablet Take 1 tablet (20 mg total) by mouth 2 (two) times daily. Patient not taking: Reported on 11/09/2014 08/07/14   Joycie PeekBenjamin Zayda Angell, PA-C  meloxicam (MOBIC) 7.5 MG tablet Take 1 tablet (7.5 mg total) by mouth daily. Patient not taking: Reported on 10/10/2014 09/18/14   Trixie DredgeEmily West, PA-C  methocarbamol (ROBAXIN) 500 MG tablet Take 1 tablet (500 mg total) by mouth every 6 (six) hours as needed for muscle spasms (and pain). Patient not taking: Reported on 10/10/2014 09/18/14   Trixie DredgeEmily West, PA-C  metoCLOPramide (REGLAN) 10 MG tablet Take 1 tablet (  10 mg total) by mouth every 8 (eight) hours. Patient not taking: Reported on 10/10/2014 08/17/14   Rachael Feeaniel P Jacobs, MD  omeprazole (PRILOSEC) 20 MG capsule Take 1 capsule (20 mg total) by mouth 2 (two) times daily before a meal. Patient not taking: Reported on 10/10/2014 06/11/14   Ambrose FinlandValerie A Keck, NP   BP 120/65 mmHg  Pulse 76  Temp(Src) 98 F (36.7 C) (Oral)  Resp 16  Ht 5\' 7"  (1.702 m)  Wt 140 lb (63.504 kg)  BMI 21.92 kg/m2  SpO2 99% Physical Exam  Constitutional: He is oriented to person, place, and time. He appears well-developed and well-nourished.  HENT:  Head: Normocephalic and atraumatic.  Mouth/Throat: Oropharynx  is clear and moist.  Eyes: Conjunctivae are normal. Pupils are equal, round, and reactive to light. Right eye exhibits no discharge. Left eye exhibits no discharge. No scleral icterus.  Neck: Normal range of motion. Neck supple.  Cardiovascular: Normal rate, regular rhythm and normal heart sounds.   Pulmonary/Chest: Effort normal and breath sounds normal. No respiratory distress. He has no wheezes. He has no rales.  Abdominal: Soft.  Diffuse tenderness in right upper quadrant and epigastrium. Abdomen is otherwise soft and nondistended without rebound or guarding. No obvious lesions or deformities. No palpable masses  Musculoskeletal: He exhibits no tenderness.  Neurological: He is alert and oriented to person, place, and time.  Cranial Nerves II-XII grossly intact  Skin: Skin is warm and dry. No rash noted.  Psychiatric: He has a normal mood and affect.  Nursing note and vitals reviewed.   ED Course  Procedures (including critical care time) Labs Review Labs Reviewed  CBC WITH DIFFERENTIAL/PLATELET - Abnormal; Notable for the following:    WBC 10.8 (*)    Hemoglobin 12.5 (*)    MCV 73.8 (*)    MCH 23.4 (*)    RDW 18.9 (*)    Neutrophils Relative % 79 (*)    Neutro Abs 8.6 (*)    All other components within normal limits  COMPREHENSIVE METABOLIC PANEL - Abnormal; Notable for the following:    Glucose, Bld 113 (*)    Total Protein 8.4 (*)    ALT 11 (*)    Anion gap 3 (*)    All other components within normal limits  LIPASE, BLOOD - Abnormal; Notable for the following:    Lipase 20 (*)    All other components within normal limits  URINALYSIS, ROUTINE W REFLEX MICROSCOPIC - Abnormal; Notable for the following:    APPearance CLOUDY (*)    All other components within normal limits    Imaging Review No results found.   EKG Interpretation None     Meds given in ED:  Medications  ketorolac (TORADOL) 30 MG/ML injection 30 mg (30 mg Intravenous Given 11/09/14 1139)  sodium  chloride 0.9 % bolus 1,000 mL (0 mLs Intravenous Stopped 11/09/14 1251)  ondansetron (ZOFRAN) injection 4 mg (4 mg Intravenous Given 11/09/14 1201)    Discharge Medication List as of 11/09/2014  2:23 PM     Filed Vitals:   11/09/14 1030 11/09/14 1130 11/09/14 1250 11/09/14 1439  BP: 128/77 139/82 117/71 120/65  Pulse: 94 95 82 76  Temp:      TempSrc:      Resp:   16 16  Height:      Weight:      SpO2: 100% 99% 97% 99%    MDM  Vitals stable - WNL -afebrile Pt resting comfortably in ED. PE-physical exam  is not clinically concerning and is largely unchanged from previous exams. Labwork-labs are essentially noncontributory and appear to be baseline for patient.  DDX--right upper quadrant pain status post cholecystectomy, this appears to be an acute exacerbation of a chronic problem. Patient states he is feeling better after administration of IV fluids, antibiotics and anti-inflammatories given in the ED. Requests to be referred to a different gastroenterologist. We will oblige. No evidence of other acute or emergent pathology at this time.  I discussed all relevant lab findings and imaging results with pt and they verbalized understanding. Discussed f/u with PCP within 48 hrs and return precautions, pt very amenable to plan.   Final diagnoses:  Abdominal pain, chronic, right upper quadrant        Joycie Peek, PA-C 11/09/14 1627  Derwood Kaplan, MD 11/09/14 [phone]

## 2014-11-16 ENCOUNTER — Encounter: Payer: Self-pay | Admitting: Internal Medicine

## 2014-11-16 ENCOUNTER — Ambulatory Visit: Payer: Self-pay | Attending: Internal Medicine | Admitting: Internal Medicine

## 2014-11-16 VITALS — BP 118/77 | HR 97 | Temp 98.0°F | Resp 16 | Wt 144.0 lb

## 2014-11-16 DIAGNOSIS — Z791 Long term (current) use of non-steroidal anti-inflammatories (NSAID): Secondary | ICD-10-CM | POA: Insufficient documentation

## 2014-11-16 DIAGNOSIS — Z79899 Other long term (current) drug therapy: Secondary | ICD-10-CM | POA: Insufficient documentation

## 2014-11-16 DIAGNOSIS — M25551 Pain in right hip: Secondary | ICD-10-CM

## 2014-11-16 DIAGNOSIS — R208 Other disturbances of skin sensation: Secondary | ICD-10-CM

## 2014-11-16 DIAGNOSIS — Z9049 Acquired absence of other specified parts of digestive tract: Secondary | ICD-10-CM | POA: Insufficient documentation

## 2014-11-16 MED ORDER — GABAPENTIN 100 MG PO CAPS: 100.0000 mg | ORAL_CAPSULE | Freq: Three times a day (TID) | ORAL | Status: DC

## 2014-11-16 NOTE — Progress Notes (Signed)
Patient complains of a burning sensation up his right leg Patient states this is not the first time he is here for this problem Patient states this burning feeling is all the time

## 2014-11-16 NOTE — Progress Notes (Signed)
MRN: [id number] Name: Levi GunningMichael N Dedeaux  Sex: male Age: 29 y.o. DOB: [birth year]  Allergies: Tramadol and Zoloft  Chief Complaint  Patient presents with  . Follow-up    right leg    HPI: Patient is 29 y.o. male who comes today and is complaining of burning sensation in his right thigh, also complaints of right hip pain denies any recent fall or trauma, denies any family history of diabetes, previous blood work reviewed had borderline elevated blood sugar level, also has history of vitamin D deficiency, he currently denies any fever chills chest and shortness of breath, denies drinking alcohol and denies smoking cigarettes currently.  Past Medical History  Diagnosis Date  . Medical history non-contributory   . Anxiety   . Panic attack as reaction to stress   . Anemia   . Depression   . Gallstones     Past Surgical History  Procedure Laterality Date  . Cholecystectomy    . Tonsillectomy    . Incise and drain abcess    . Mouth surgery        Medication List       This list is accurate as of: 11/16/14  1:07 PM.  Always use your most recent med list.               diclofenac sodium 1 % Gel  Commonly known as:  VOLTAREN  Apply 2 g topically 4 (four) times daily.     dicyclomine 20 MG tablet  Commonly known as:  BENTYL  Take 1 tablet (20 mg total) by mouth 2 (two) times daily.     doxylamine (Sleep) 25 MG tablet  Commonly known as:  UNISOM  Take 25 mg by mouth at bedtime as needed for sleep.     gabapentin 100 MG capsule  Commonly known as:  NEURONTIN  Take 1 capsule (100 mg total) by mouth 3 (three) times daily.     meloxicam 7.5 MG tablet  Commonly known as:  MOBIC  Take 1 tablet (7.5 mg total) by mouth daily.     methocarbamol 500 MG tablet  Commonly known as:  ROBAXIN  Take 1 tablet (500 mg total) by mouth every 6 (six) hours as needed for muscle spasms (and pain).     metoCLOPramide 10 MG tablet  Commonly known as:  REGLAN  Take 1 tablet (10 mg  total) by mouth every 8 (eight) hours.     omeprazole 20 MG capsule  Commonly known as:  PRILOSEC  Take 1 capsule (20 mg total) by mouth 2 (two) times daily before a meal.     pantoprazole 40 MG tablet  Commonly known as:  PROTONIX  Take 1 tablet (40 mg total) by mouth daily.     promethazine 12.5 MG tablet  Commonly known as:  PHENERGAN  Take 1 tablet (12.5 mg total) by mouth every 8 (eight) hours as needed for nausea or vomiting.        Meds ordered this encounter  Medications  . gabapentin (NEURONTIN) 100 MG capsule    Sig: Take 1 capsule (100 mg total) by mouth 3 (three) times daily.    Dispense:  90 capsule    Refill:  3     There is no immunization history on file for this patient.  Family History  Problem Relation Age of Onset  . Irritable bowel syndrome Mother   . Ulcerative colitis Maternal Grandfather   . Colon cancer Neg Hx   . Colon polyps  Neg Hx   . Kidney disease Mother   . Gallbladder disease Neg Hx   . Esophageal cancer Neg Hx   . Diabetes Neg Hx     History  Substance Use Topics  . Smoking status: Never Smoker   . Smokeless tobacco: Never Used  . Alcohol Use: No    Review of Systems   As noted in HPI  Filed Vitals:   11/16/14 1232  BP: 118/77  Pulse: 97  Temp: 98 F (36.7 C)  Resp: 16    Physical Exam  Physical Exam  Eyes: EOM are normal. Pupils are equal, round, and reactive to light.  Cardiovascular: Normal rate and regular rhythm.   Pulmonary/Chest: Breath sounds normal. No respiratory distress. He has no wheezes. He has no rales.  Musculoskeletal:  No spinal or paraspinal tenderness, SLR negative, some tenderness anteriorly on the right hip joint.    CBC    Component Value Date/Time   WBC 10.8* 11/09/2014 1023   RBC 5.34 11/09/2014 1023   HGB 12.5* 11/09/2014 1023   HCT 39.4 11/09/2014 1023   PLT 365 11/09/2014 1023   MCV 73.8* 11/09/2014 1023   LYMPHSABS 1.6 11/09/2014 1023   MONOABS 0.5 11/09/2014 1023   EOSABS  0.1 11/09/2014 1023   BASOSABS 0.0 11/09/2014 1023    CMP     Component Value Date/Time   NA 136 11/09/2014 1023   K 3.9 11/09/2014 1023   CL 106 11/09/2014 1023   CO2 27 11/09/2014 1023   GLUCOSE 113* 11/09/2014 1023   BUN 16 11/09/2014 1023   CREATININE 0.99 11/09/2014 1023   CREATININE 0.88 08/19/2013 1152   CALCIUM 9.2 11/09/2014 1023   PROT 8.4* 11/09/2014 1023   ALBUMIN 4.7 11/09/2014 1023   AST 17 11/09/2014 1023   ALT 11* 11/09/2014 1023   ALKPHOS 70 11/09/2014 1023   BILITOT 0.5 11/09/2014 1023   GFRNONAA >60 11/09/2014 1023   GFRNONAA >89 08/19/2013 1152   GFRAA >60 11/09/2014 1023   GFRAA >89 08/19/2013 1152    Lab Results  Component Value Date/Time   CHOL 142 08/19/2013 11:52 AM    No results found for: HGBA1C  Lab Results  Component Value Date/Time   AST 17 11/09/2014 10:23 AM    Assessment and Plan  Burning sensation in lower extremity - Plan: Hemoglobin A1c, Vit D  25 hydroxy (rtn osteoporosis monitoring), Vitamin B12, trial of gabapentin (NEURONTIN) 100 MG capsule  Right hip pain - Plan: DG Arthro Hip Right, gabapentin (NEURONTIN) 100 MG capsule   Return in about 3 months (around 02/16/2015), or if symptoms worsen or fail to improve.   This note has been created with Education officer, environmentalDragon speech recognition software and smart phrase technology. Any transcriptional errors are unintentional.    Doris CheadleADVANI, Levi Fraticelli, MD

## 2014-12-12 ENCOUNTER — Ambulatory Visit: Payer: Self-pay

## 2014-12-19 ENCOUNTER — Emergency Department (HOSPITAL_COMMUNITY)
Admission: EM | Admit: 2014-12-19 | Discharge: 2014-12-19 | Disposition: A | Discharge status: Home / Self Care | Payer: Self-pay | Attending: Emergency Medicine | Admitting: Emergency Medicine

## 2014-12-19 ENCOUNTER — Encounter (HOSPITAL_COMMUNITY): Payer: Self-pay | Admitting: Emergency Medicine

## 2014-12-19 DIAGNOSIS — Z590 Homelessness: Secondary | ICD-10-CM | POA: Insufficient documentation

## 2014-12-19 DIAGNOSIS — Z862 Personal history of diseases of the blood and blood-forming organs and certain disorders involving the immune mechanism: Secondary | ICD-10-CM | POA: Insufficient documentation

## 2014-12-19 DIAGNOSIS — G8929 Other chronic pain: Secondary | ICD-10-CM | POA: Insufficient documentation

## 2014-12-19 DIAGNOSIS — Z8719 Personal history of other diseases of the digestive system: Secondary | ICD-10-CM | POA: Insufficient documentation

## 2014-12-19 DIAGNOSIS — R112 Nausea with vomiting, unspecified: Secondary | ICD-10-CM | POA: Insufficient documentation

## 2014-12-19 DIAGNOSIS — R1084 Generalized abdominal pain: Secondary | ICD-10-CM | POA: Insufficient documentation

## 2014-12-19 DIAGNOSIS — F419 Anxiety disorder, unspecified: Secondary | ICD-10-CM | POA: Insufficient documentation

## 2014-12-19 DIAGNOSIS — Z9049 Acquired absence of other specified parts of digestive tract: Secondary | ICD-10-CM | POA: Insufficient documentation

## 2014-12-19 LAB — COMPREHENSIVE METABOLIC PANEL
ALBUMIN: 4.5 g/dL (ref 3.5–5.0)
ALK PHOS: 72 U/L (ref 38–126)
ALT: 10 U/L — AB (ref 17–63)
AST: 14 U/L — AB (ref 15–41)
Anion gap: 8 (ref 5–15)
BILIRUBIN TOTAL: 0.5 mg/dL (ref 0.3–1.2)
BUN: 20 mg/dL (ref 6–20)
CHLORIDE: 104 mmol/L (ref 101–111)
CO2: 25 mmol/L (ref 22–32)
Calcium: 9.5 mg/dL (ref 8.9–10.3)
Creatinine, Ser: 1.07 mg/dL (ref 0.61–1.24)
GFR calc Af Amer: >60 mL/min (ref >60)
Glucose, Bld: 101 mg/dL — ABNORMAL HIGH (ref 65–99)
POTASSIUM: 3.6 mmol/L (ref 3.5–5.1)
SODIUM: 137 mmol/L (ref 135–145)
Total Protein: 8.2 g/dL — ABNORMAL HIGH (ref 6.5–8.1)

## 2014-12-19 LAB — CBC WITH DIFFERENTIAL/PLATELET
BASOS PCT: 0 % (ref 0–1)
Basophils Absolute: 0 10*3/uL (ref 0.0–0.1)
EOS ABS: 0.2 10*3/uL (ref 0.0–0.7)
Eosinophils Relative: 2 % (ref 0–5)
HCT: 38.6 % — ABNORMAL LOW (ref 39.0–52.0)
Hemoglobin: 12.3 g/dL — ABNORMAL LOW (ref 13.0–17.0)
Lymphocytes Relative: 21 % (ref 12–46)
Lymphs Abs: 2.1 10*3/uL (ref 0.7–4.0)
MCH: 23.7 pg — AB (ref 26.0–34.0)
MCHC: 31.9 g/dL (ref 30.0–36.0)
MCV: 74.2 fL — ABNORMAL LOW (ref 78.0–100.0)
MONO ABS: 0.6 10*3/uL (ref 0.1–1.0)
Monocytes Relative: 6 % (ref 3–12)
Neutro Abs: 7 10*3/uL (ref 1.7–7.7)
Neutrophils Relative %: 71 % (ref 43–77)
Platelets: 298 10*3/uL (ref 150–400)
RBC: 5.2 MIL/uL (ref 4.22–5.81)
RDW: 16.9 % — ABNORMAL HIGH (ref 11.5–15.5)
WBC: 10 10*3/uL (ref 4.0–10.5)

## 2014-12-19 LAB — LIPASE, BLOOD: Lipase: 15 U/L — ABNORMAL LOW (ref 22–51)

## 2014-12-19 MED ORDER — METOCLOPRAMIDE HCL 10 MG PO TABS: 10.0000 mg | ORAL_TABLET | Freq: Three times a day (TID) | ORAL | Status: DC

## 2014-12-19 MED ORDER — SODIUM CHLORIDE 0.9 % IV BOLUS (SEPSIS)
1000.0000 mL | Freq: Once | INTRAVENOUS | Status: AC
  Administered 2014-12-19: 1000 mL via INTRAVENOUS

## 2014-12-19 MED ORDER — PROMETHAZINE HCL 25 MG/ML IJ SOLN
25.0000 mg | Freq: Once | INTRAMUSCULAR | Status: AC
  Administered 2014-12-19: 25 mg via INTRAVENOUS
  Filled 2014-12-19: qty 1

## 2014-12-19 MED ORDER — ONDANSETRON HCL 4 MG/2ML IJ SOLN
4.0000 mg | Freq: Once | INTRAMUSCULAR | Status: AC
  Administered 2014-12-19: 4 mg via INTRAVENOUS
  Filled 2014-12-19: qty 2

## 2014-12-19 MED ORDER — DICYCLOMINE HCL 20 MG PO TABS: 20.0000 mg | ORAL_TABLET | Freq: Two times a day (BID) | ORAL | Status: DC

## 2014-12-19 MED ORDER — MORPHINE SULFATE 4 MG/ML IJ SOLN
4.0000 mg | Freq: Once | INTRAMUSCULAR | Status: AC
  Administered 2014-12-19: 4 mg via INTRAVENOUS
  Filled 2014-12-19: qty 1

## 2014-12-19 NOTE — Discharge Instructions (Signed)

## 2014-12-19 NOTE — ED Notes (Signed)
Pt c/o n/v x 3 days, pt also having abd pain.

## 2014-12-19 NOTE — ED Provider Notes (Signed)
CSN: [id number]     Arrival date & time 12/19/14  0535 History   First MD Initiated Contact with Patient 12/19/14 [id number]     Chief Complaint  Patient presents with  . Abdominal Pain     (Consider location/radiation/quality/duration/timing/severity/associated sxs/prior Treatment) HPI    PCP: Doris Cheadle, MD Blood pressure 116/65, pulse 68, temperature 98 F (36.7 C), temperature source Oral, resp. rate 18, height  (1.702 m), weight 145 lb (65.772 kg), SpO2 100 %.  Levi Palmer is a 29 y.o.male with a significant PMH of anxiety, hx of homelessness, chronic abdominal pain, cholecystectomy, panic attacks, anemia, depression, gallstones presents to the ER with complaints of abdominal pain. The patient has had abdominal pain for two years with daily, morning nausea and vomiting as well for the past two years. He see's a gastroenterologist, Dr. Christella Hartigan, who has been working up his symptoms. Recently an MRI was done to evaluate for retained stones in the bile duct and it showed "Prior cholecystectomy. No evidence of biliary ductal dilatation or other significant abnormality." on February 2016.  The next step is for Dr. Christella Hartigan to do an EGD because he is unsure of the etiology of his pain. The patient came to the ER today because usually the pain only lasts an hour but this morning it did not alleviated. He admits that sometimes his pain does not stop after an hour and he needs to seek out emergency care. The pain is the same pattern as normal, diffuse across his upper abdomen. The patient denies having any symptoms of hematemesis, diarrhea, rectal bleeding, dysuria, penile discharge, testicular pain, back pain, fevers, syncope. He denies any new quality to his pain-- only worsened severity.    Past Medical History  Diagnosis Date  . Medical history non-contributory   . Anxiety   . Panic attack as reaction to stress   . Anemia   . Depression   . Gallstones    Past Surgical History   Procedure Laterality Date  . Cholecystectomy    . Tonsillectomy    . Incise and drain abcess    . Mouth surgery     Family History  Problem Relation Age of Onset  . Irritable bowel syndrome Mother   . Ulcerative colitis Maternal Grandfather   . Colon cancer Neg Hx   . Colon polyps Neg Hx   . Kidney disease Mother   . Gallbladder disease Neg Hx   . Esophageal cancer Neg Hx   . Diabetes Neg Hx    History  Substance Use Topics  . Smoking status: Never Smoker   . Smokeless tobacco: Never Used  . Alcohol Use: No    Review of Systems  10 Systems reviewed and are negative for acute change except as noted in the HPI.   Allergies  Tramadol and Zoloft  Home Medications   Prior to Admission medications   Medication Sig Start Date End Date Taking? Authorizing Provider  doxylamine, Sleep, (UNISOM) 25 MG tablet Take 25 mg by mouth at bedtime as needed for sleep.   Yes Historical Provider, MD  diclofenac sodium (VOLTAREN) 1 % GEL Apply 2 g topically 4 (four) times daily. Patient not taking: Reported on 10/10/2014 08/03/14   Ambrose Finland, NP  dicyclomine (BENTYL) 20 MG tablet Take 1 tablet (20 mg total) by mouth 2 (two) times daily. 12/19/14   Yasemin Rabon Neva Seat, PA-C  gabapentin (NEURONTIN) 100 MG capsule Take 1 capsule (100 mg total) by mouth 3 (three) times daily. Patient  not taking: Reported on 12/19/2014 11/16/14   Doris Cheadle, MD  meloxicam (MOBIC) 7.5 MG tablet Take 1 tablet (7.5 mg total) by mouth daily. Patient not taking: Reported on 10/10/2014 09/18/14   Trixie Dredge, PA-C  methocarbamol (ROBAXIN) 500 MG tablet Take 1 tablet (500 mg total) by mouth every 6 (six) hours as needed for muscle spasms (and pain). Patient not taking: Reported on 10/10/2014 09/18/14   Trixie Dredge, PA-C  metoCLOPramide (REGLAN) 10 MG tablet Take 1 tablet (10 mg total) by mouth every 8 (eight) hours. 12/19/14   Damir Leung Neva Seat, PA-C  omeprazole (PRILOSEC) 20 MG capsule Take 1 capsule (20 mg total) by mouth 2 (two)  times daily before a meal. Patient not taking: Reported on 10/10/2014 06/11/14   Ambrose Finland, NP  pantoprazole (PROTONIX) 40 MG tablet Take 1 tablet (40 mg total) by mouth daily. Patient not taking: Reported on 12/19/2014 10/26/14   Doris Cheadle, MD  promethazine (PHENERGAN) 12.5 MG tablet Take 1 tablet (12.5 mg total) by mouth every 8 (eight) hours as needed for nausea or vomiting. Patient not taking: Reported on 12/19/2014 11/07/14   Doris Cheadle, MD   BP 116/65 mmHg  Pulse 68  Temp(Src) 98 F (36.7 C) (Oral)  Resp 18  Ht 5\' 7"  (1.702 m)  Wt 145 lb (65.772 kg)  BMI 22.71 kg/m2  SpO2 100% Physical Exam  Constitutional: He appears well-developed and well-nourished. No distress.  HENT:  Head: Normocephalic and atraumatic.  Eyes: Pupils are equal, round, and reactive to light.  Neck: Normal range of motion. Neck supple.  Cardiovascular: Normal rate and regular rhythm.   Pulmonary/Chest: Effort normal.  Abdominal: Soft. Bowel sounds are normal. There is tenderness (diffuse). There is no rigidity, no rebound, no guarding and no CVA tenderness.  Neurological: He is alert.  Skin: Skin is warm and dry.  Nursing note and vitals reviewed.   ED Course  Procedures (including critical care time) Labs Review Labs Reviewed  COMPREHENSIVE METABOLIC PANEL - Abnormal; Notable for the following:    Glucose, Bld 101 (*)    Total Protein 8.2 (*)    AST 14 (*)    ALT 10 (*)    All other components within normal limits  LIPASE, BLOOD - Abnormal; Notable for the following:    Lipase 15 (*)    All other components within normal limits  CBC WITH DIFFERENTIAL/PLATELET - Abnormal; Notable for the following:    Hemoglobin 12.3 (*)    HCT 38.6 (*)    MCV 74.2 (*)    MCH 23.7 (*)    RDW 16.9 (*)    All other components within normal limits    Imaging Review No results found.   EKG Interpretation None      MDM   Final diagnoses:  Non-intractable vomiting with nausea, vomiting of  unspecified type  Generalized abdominal pain   Patients pain tolerated in the ED without any episodes of vomiting. His lab work is reassuring and at baseline.  He is resting quality in his stretcher. I recommend he follow-up with Dr. Christella Hartigan.  Rx: Bentyl and Reglan  Medications  sodium chloride 0.9 % bolus 1,000 mL (0 mLs Intravenous Stopped 12/19/14 0814)  ondansetron (ZOFRAN) injection 4 mg (4 mg Intravenous Given 12/19/14 0648)  promethazine (PHENERGAN) injection 25 mg (25 mg Intravenous Given 12/19/14 0701)  morphine 4 MG/ML injection 4 mg (4 mg Intravenous Given 12/19/14 0834)    29 y.o.Levi Palmer's evaluation in the Emergency Department is complete.  It has been determined that no acute conditions requiring further emergency intervention are present at this time. The patient/guardian have been advised of the diagnosis and plan. We have discussed signs and symptoms that warrant return to the ED, such as changes or worsening in symptoms.  Vital signs are stable at discharge. Filed Vitals:   12/19/14 0702  BP: 116/65  Pulse: 68  Temp:   Resp: 18    Patient/guardian has voiced understanding and agreed to follow-up with the PCP or specialist.              Marlon Pel, PA-C 12/19/14 4098  Bethann Berkshire, MD 12/20/14 1723

## 2015-01-02 ENCOUNTER — Telehealth: Payer: Self-pay

## 2015-01-02 ENCOUNTER — Telehealth: Payer: Self-pay | Admitting: Internal Medicine

## 2015-01-02 DIAGNOSIS — R112 Nausea with vomiting, unspecified: Secondary | ICD-10-CM

## 2015-01-02 NOTE — Telephone Encounter (Signed)
Pt is stating that he has lost about 20 lbs in the last 2 months. He is not improving and is having nausa, vomitting, constipation and his teeth are falling out and he cant chew his food. He sounds very discouraged about his health and does not feel like he has the support he needs. I have advised the patient to come apply for the orange card and the cone discount. He is in need of being referred to the dentist. Please follow up with pt about pt health concerns. He says he cant afford to go to urgent care and needs to be seen asap.

## 2015-01-02 NOTE — Telephone Encounter (Signed)
Patient called complaining of still not feeling well Patient states he vomits just about every morning Has lost about twenty pounds Was seen by the GI doctor and felt as though they did not do anything for him Patient is generally frustrated and would like to be seen by his doctor  Spoke with Dr Hyman HopesJegede and we can see the patient in the walk in schedule with Tiffany this Thursday 01/04/2015 at 12pm

## 2015-01-04 ENCOUNTER — Ambulatory Visit: Payer: Self-pay | Attending: Internal Medicine | Admitting: Physician Assistant

## 2015-01-04 VITALS — BP 114/74 | HR 97 | Temp 98.1°F | Resp 18 | Ht 67.0 in | Wt 135.6 lb

## 2015-01-04 DIAGNOSIS — R112 Nausea with vomiting, unspecified: Secondary | ICD-10-CM | POA: Insufficient documentation

## 2015-01-04 MED ORDER — OXYCODONE-ACETAMINOPHEN 10-325 MG PO TABS: 1.0000 | ORAL_TABLET | ORAL | Status: DC | PRN

## 2015-01-04 MED ORDER — PROMETHAZINE HCL 25 MG PO TABS: 25.0000 mg | ORAL_TABLET | Freq: Three times a day (TID) | ORAL | Status: DC | PRN

## 2015-01-04 NOTE — Progress Notes (Signed)
Patient here stating he has been getting sick for about 5-6 years.  Has previously seen GI specialist and followed their instructions.  Patient states he is not getting better, but actually getting worse.  Patient reports last time vomiting was yesterday.  Patient feels nauseas today.  Patient complains of stomach and neck pain that spreads down bottom of back through leg.  Every time patient goes to the bathroom he states he becomes nauseas and throws up.  Patient tries to take hot shower to feel better.  Reglan causes shakiness and makes him "wants to jump out of skin" but that it does work.  Phenergan works too, but takes 20-30 min to kick in.  Zofran does not work.  Patient has not taking nausea medicine today. Stomach pain 7/10, described as constant ache.    Patient states lately he feels really fatigued and short of breath, has difficulty sleeping, and can't really eat because his teeth are messed up from throwing up so much.  Patient reports taking Ferrous Sulfate today.

## 2015-01-04 NOTE — Progress Notes (Signed)
Chief Complaint: Abdominal pain  Subjective: 29 year old male with multiple presentations over the last several months with acute on chronic abdominal pain. He most recently was in the emergency department on June 14. Labs and examination were essentially normal except that time. He was treated with IV fluids, Zofran, Phenergan and morphine with improvement of his symptoms. He was given a prescription for Bentyl and Reglan for which he did not feel. He states the Reglan makes him jittery. He is really not taking any medications.  Yet, he continues to complain of persistent, diffuse abdominal pain. He has associated nausea and vomiting. Most of his symptoms are in the right upper quadrant. He recently had an abdominal MRI that was negative. His symptoms are crampy or squeezing in nature. He's had a loss of appetite. His weight has decreased probably 15 pounds over the last couple months. He also has some nausea associated with defecation.   ROS:  GEN: denies fever or chills, denies +weight loss LUNGS: denies SHOB, dyspnea, PND, orthopnea CV: denies CP or palpitations ABD: +abd pain, N or V    Objective:  Filed Vitals:   01/04/15 1217  BP: 114/74  Pulse: 97  Temp: 98.1 F (36.7 C)  TempSrc: Oral  Resp: 18  Height:  (1.702 m)  Weight: 135 lb 9.6 oz (61.508 kg)  SpO2: 99%    Physical Exam:  General: in no acute distress. Heart: Normal  s1 &s2  Regular rate and rhythm, without murmurs, rubs, gallops. Lungs: Clear to auscultation bilaterally. Abdomen: Soft, dissusely tender, nondistended, positive bowel sounds.   Pertinent Lab Results:none   Medications: Prior to Admission medications   Medication Sig Start Date End Date Taking? Authorizing Provider  doxylamine, Sleep, (UNISOM) 25 MG tablet Take 25 mg by mouth at bedtime as needed for sleep.   Yes Historical Provider, MD  diclofenac sodium (VOLTAREN) 1 % GEL Apply 2 g topically 4 (four) times daily. Patient not taking:  Reported on 10/10/2014 08/03/14   Ambrose Finland, NP  dicyclomine (BENTYL) 20 MG tablet Take 1 tablet (20 mg total) by mouth 2 (two) times daily. Patient not taking: Reported on 01/04/2015 12/19/14   Marlon Pel, PA-C  gabapentin (NEURONTIN) 100 MG capsule Take 1 capsule (100 mg total) by mouth 3 (three) times daily. Patient not taking: Reported on 12/19/2014 11/16/14   Doris Cheadle, MD  meloxicam (MOBIC) 7.5 MG tablet Take 1 tablet (7.5 mg total) by mouth daily. Patient not taking: Reported on 10/10/2014 09/18/14   Trixie Dredge, PA-C  methocarbamol (ROBAXIN) 500 MG tablet Take 1 tablet (500 mg total) by mouth every 6 (six) hours as needed for muscle spasms (and pain). Patient not taking: Reported on 10/10/2014 09/18/14   Trixie Dredge, PA-C  metoCLOPramide (REGLAN) 10 MG tablet Take 1 tablet (10 mg total) by mouth every 8 (eight) hours. Patient not taking: Reported on 01/04/2015 12/19/14   Marlon Pel, PA-C  omeprazole (PRILOSEC) 20 MG capsule Take 1 capsule (20 mg total) by mouth 2 (two) times daily before a meal. Patient not taking: Reported on 10/10/2014 06/11/14   Ambrose Finland, NP  oxyCODONE-acetaminophen (PERCOCET) 10-325 MG per tablet Take 1 tablet by mouth every 4 (four) hours as needed for pain. 01/04/15   Vivianne Master, PA-C  pantoprazole (PROTONIX) 40 MG tablet Take 1 tablet (40 mg total) by mouth daily. Patient not taking: Reported on 12/19/2014 10/26/14   Doris Cheadle, MD  promethazine (PHENERGAN) 25 MG tablet Take 1 tablet (25 mg total) by  mouth every 8 (eight) hours as needed for nausea or vomiting. 01/04/15   Vivianne Masteriffany S Noel, PA-C    Assessment: 1. Acute on Chronic Abdominal Pain ? etiology   Plan: Refill Percocet and Phenergan Appt with [facility] Encouraged hydration  Follow up:4 weeks with Dr. Orpah CobbAdvani  The patient was given clear instructions to go to ER or return to medical center if symptoms don't improve, worsen or new problems develop. The patient verbalized  understanding. The patient was told to call to get lab results if they haven't heard anything in the next week.   This note has been created with Education officer, environmentalDragon speech recognition software and smart phrase technology. Any transcriptional errors are unintentional.   Scot Juniffany Noel, PA-C 01/04/2015, 1:00 PM

## 2015-01-29 ENCOUNTER — Ambulatory Visit: Payer: Self-pay | Attending: Internal Medicine | Admitting: Internal Medicine

## 2015-01-29 ENCOUNTER — Encounter: Payer: Self-pay | Admitting: Internal Medicine

## 2015-01-29 VITALS — BP 123/80 | HR 100 | Temp 97.8°F | Resp 16 | Wt 134.4 lb

## 2015-01-29 DIAGNOSIS — F419 Anxiety disorder, unspecified: Secondary | ICD-10-CM | POA: Insufficient documentation

## 2015-01-29 DIAGNOSIS — G8929 Other chronic pain: Secondary | ICD-10-CM | POA: Insufficient documentation

## 2015-01-29 DIAGNOSIS — Z79899 Other long term (current) drug therapy: Secondary | ICD-10-CM | POA: Insufficient documentation

## 2015-01-29 DIAGNOSIS — F329 Major depressive disorder, single episode, unspecified: Secondary | ICD-10-CM | POA: Insufficient documentation

## 2015-01-29 DIAGNOSIS — R112 Nausea with vomiting, unspecified: Secondary | ICD-10-CM | POA: Insufficient documentation

## 2015-01-29 DIAGNOSIS — R109 Unspecified abdominal pain: Secondary | ICD-10-CM | POA: Insufficient documentation

## 2015-01-29 DIAGNOSIS — R1013 Epigastric pain: Secondary | ICD-10-CM

## 2015-01-29 DIAGNOSIS — K219 Gastro-esophageal reflux disease without esophagitis: Secondary | ICD-10-CM | POA: Insufficient documentation

## 2015-01-29 MED ORDER — OMEPRAZOLE 20 MG PO CPDR: 20.0000 mg | DELAYED_RELEASE_CAPSULE | Freq: Every day | ORAL | Status: DC

## 2015-01-29 MED ORDER — ACETAMINOPHEN-CODEINE #3 300-30 MG PO TABS: 1.0000 | ORAL_TABLET | Freq: Three times a day (TID) | ORAL | Status: DC | PRN

## 2015-01-29 MED ORDER — PROMETHAZINE HCL 25 MG PO TABS: 25.0000 mg | ORAL_TABLET | Freq: Three times a day (TID) | ORAL | Status: DC | PRN

## 2015-01-29 NOTE — Progress Notes (Signed)
Patient here for follow up on his chronic stomach issues Was last seen for nausea and vomiting

## 2015-01-29 NOTE — Progress Notes (Signed)
MRN: [id number] Name: Levi Palmer  Sex: male Age: 29 y.o. DOB: Nov 21, 1985  Allergies: Tramadol and Zoloft  Chief Complaint  Patient presents with  . Follow-up    HPI: Patient is 29 y.o. male who has history of chronic abdominal pain, GERD , nausea, vomiting, patient is in the process to get the insurance, has not been able to see a gastroenterologist yet, was seen in the office 3-4 weeks ago by PA Tiffany , was prescribed nausea medication and pain medication, as per patient he ran out of the meds and needs  refill.  Past Medical History  Diagnosis Date  . Medical history non-contributory   . Anxiety   . Panic attack as reaction to stress   . Anemia   . Depression   . Gallstones     Past Surgical History  Procedure Laterality Date  . Cholecystectomy    . Tonsillectomy    . Incise and drain abcess    . Mouth surgery        Medication List       This list is accurate as of: 01/29/15 11:51 AM.  Always use your most recent med list.               acetaminophen-codeine 300-30 MG per tablet  Commonly known as:  TYLENOL #3  Take 1 tablet by mouth every 8 (eight) hours as needed for moderate pain.     diclofenac sodium 1 % Gel  Commonly known as:  VOLTAREN  Apply 2 g topically 4 (four) times daily.     dicyclomine 20 MG tablet  Commonly known as:  BENTYL  Take 1 tablet (20 mg total) by mouth 2 (two) times daily.     doxylamine (Sleep) 25 MG tablet  Commonly known as:  UNISOM  Take 25 mg by mouth at bedtime as needed for sleep.     gabapentin 100 MG capsule  Commonly known as:  NEURONTIN  Take 1 capsule (100 mg total) by mouth 3 (three) times daily.     meloxicam 7.5 MG tablet  Commonly known as:  MOBIC  Take 1 tablet (7.5 mg total) by mouth daily.     methocarbamol 500 MG tablet  Commonly known as:  ROBAXIN  Take 1 tablet (500 mg total) by mouth every 6 (six) hours as needed for muscle spasms (and pain).     metoCLOPramide 10 MG tablet    Commonly known as:  REGLAN  Take 1 tablet (10 mg total) by mouth every 8 (eight) hours.     omeprazole 20 MG capsule  Commonly known as:  PRILOSEC  Take 1 capsule (20 mg total) by mouth daily.     oxyCODONE-acetaminophen 10-325 MG per tablet  Commonly known as:  PERCOCET  Take 1 tablet by mouth every 4 (four) hours as needed for pain.     pantoprazole 40 MG tablet  Commonly known as:  PROTONIX  Take 1 tablet (40 mg total) by mouth daily.     promethazine 25 MG tablet  Commonly known as:  PHENERGAN  Take 1 tablet (25 mg total) by mouth every 8 (eight) hours as needed for nausea or vomiting.        Meds ordered this encounter  Medications  . promethazine (PHENERGAN) 25 MG tablet    Sig: Take 1 tablet (25 mg total) by mouth every 8 (eight) hours as needed for nausea or vomiting.    Dispense:  30 tablet    Refill:  1  . omeprazole (PRILOSEC) 20 MG capsule    Sig: Take 1 capsule (20 mg total) by mouth daily.    Dispense:  30 capsule    Refill:  3  . acetaminophen-codeine (TYLENOL #3) 300-30 MG per tablet    Sig: Take 1 tablet by mouth every 8 (eight) hours as needed for moderate pain.    Dispense:  30 tablet    Refill:  0     There is no immunization history on file for this patient.  Family History  Problem Relation Age of Onset  . Irritable bowel syndrome Mother   . Ulcerative colitis Maternal Grandfather   . Colon cancer Neg Hx   . Colon polyps Neg Hx   . Kidney disease Mother   . Gallbladder disease Neg Hx   . Esophageal cancer Neg Hx   . Diabetes Neg Hx     History  Substance Use Topics  . Smoking status: Never Smoker   . Smokeless tobacco: Never Used  . Alcohol Use: No    Review of Systems   As noted in HPI  Filed Vitals:   01/29/15 1016  BP: 123/80  Pulse: 100  Temp: 97.8 F (36.6 C)  Resp: 16    Physical Exam  Physical Exam  Constitutional: No distress.  Eyes: EOM are normal. Pupils are equal, round, and reactive to light.   Cardiovascular: Normal rate and regular rhythm.   Pulmonary/Chest: Breath sounds normal. No respiratory distress. He has no wheezes. He has no rales.  Abdominal: Soft. There is no tenderness. There is no rebound.    CBC    Component Value Date/Time   WBC 10.0 12/19/2014 0650   RBC 5.20 12/19/2014 0650   HGB 12.3* 12/19/2014 0650   HCT 38.6* 12/19/2014 0650   PLT 298 12/19/2014 0650   MCV 74.2* 12/19/2014 0650   LYMPHSABS 2.1 12/19/2014 0650   MONOABS 0.6 12/19/2014 0650   EOSABS 0.2 12/19/2014 0650   BASOSABS 0.0 12/19/2014 0650    CMP     Component Value Date/Time   NA 137 12/19/2014 0650   K 3.6 12/19/2014 0650   CL 104 12/19/2014 0650   CO2 25 12/19/2014 0650   GLUCOSE 101* 12/19/2014 0650   BUN 20 12/19/2014 0650   CREATININE 1.07 12/19/2014 0650   CREATININE 0.88 08/19/2013 1152   CALCIUM 9.5 12/19/2014 0650   PROT 8.2* 12/19/2014 0650   ALBUMIN 4.5 12/19/2014 0650   AST 14* 12/19/2014 0650   ALT 10* 12/19/2014 0650   ALKPHOS 72 12/19/2014 0650   BILITOT 0.5 12/19/2014 0650   GFRNONAA >60 12/19/2014 0650   GFRNONAA >89 08/19/2013 1152   GFRAA >60 12/19/2014 0650   GFRAA >89 08/19/2013 1152    Lab Results  Component Value Date/Time   CHOL 142 08/19/2013 11:52 AM    No results found for: HGBA1C  Lab Results  Component Value Date/Time   AST 14* 12/19/2014 06:50 AM    Assessment and Plan  Non-intractable vomiting with nausea, vomiting of unspecified type - Plan: promethazine (PHENERGAN) 25 MG tablet  Abdominal pain, chronic, epigastric - Plan: acetaminophen-codeine (TYLENOL #3) 300-30 MG per tablet  Gastroesophageal reflux disease, esophagitis presence not specified - Plan: plaster modification, continue with omeprazole (PRILOSEC) 20 MG capsule  Patient to follow with GI.   Return in about 3 months (around 05/01/2015), or if symptoms worsen or fail to improve.   This note has been created with Personnel officer.  Any transcriptional errors are unintentional.    Lorayne Marek, MD

## 2015-01-31 ENCOUNTER — Ambulatory Visit: Payer: No Typology Code available for payment source | Attending: Internal Medicine

## 2015-02-05 ENCOUNTER — Ambulatory Visit: Payer: Self-pay | Attending: Internal Medicine | Admitting: Internal Medicine

## 2015-02-05 ENCOUNTER — Encounter: Payer: Self-pay | Admitting: Internal Medicine

## 2015-02-05 VITALS — BP 125/85 | HR 103 | Temp 98.0°F | Resp 16 | Wt 137.4 lb

## 2015-02-05 DIAGNOSIS — R1011 Right upper quadrant pain: Secondary | ICD-10-CM

## 2015-02-05 DIAGNOSIS — F329 Major depressive disorder, single episode, unspecified: Secondary | ICD-10-CM | POA: Insufficient documentation

## 2015-02-05 DIAGNOSIS — F419 Anxiety disorder, unspecified: Secondary | ICD-10-CM | POA: Insufficient documentation

## 2015-02-05 DIAGNOSIS — R112 Nausea with vomiting, unspecified: Secondary | ICD-10-CM | POA: Insufficient documentation

## 2015-02-05 DIAGNOSIS — R109 Unspecified abdominal pain: Secondary | ICD-10-CM | POA: Insufficient documentation

## 2015-02-05 DIAGNOSIS — K029 Dental caries, unspecified: Secondary | ICD-10-CM | POA: Insufficient documentation

## 2015-02-05 DIAGNOSIS — K219 Gastro-esophageal reflux disease without esophagitis: Secondary | ICD-10-CM | POA: Insufficient documentation

## 2015-02-05 DIAGNOSIS — G8929 Other chronic pain: Secondary | ICD-10-CM | POA: Insufficient documentation

## 2015-02-05 DIAGNOSIS — R1010 Upper abdominal pain, unspecified: Secondary | ICD-10-CM

## 2015-02-05 DIAGNOSIS — Z79899 Other long term (current) drug therapy: Secondary | ICD-10-CM | POA: Insufficient documentation

## 2015-02-05 MED ORDER — PROMETHAZINE HCL 25 MG/ML IJ SOLN
25.0000 mg | Freq: Once | INTRAMUSCULAR | Status: AC
  Administered 2015-02-05: 25 mg via INTRAMUSCULAR

## 2015-02-05 MED ORDER — DICYCLOMINE HCL 20 MG PO TABS: 20.0000 mg | ORAL_TABLET | Freq: Two times a day (BID) | ORAL | Status: DC

## 2015-02-05 NOTE — Progress Notes (Signed)
MRN: [id number] Name: Levi Palmer  Sex: male Age: 29 y.o. DOB: 1985-10-06  Allergies: Tramadol and Zoloft  Chief Complaint  Patient presents with  . Abdominal Pain    HPI: Patient is 29 y.o. male who has to of chronic abdominal pain, GERD, nausea vomiting, patient can today for the referral to GI, as per patient he has vomited 3 times today, still feels nauseous, denies any fever chills change in bowel habits, he explains that the pain is more crampy, in the past he was given prescription for Bentyl. Patient also has dental cavities and is requesting referral to see a dentist.  Past Medical History  Diagnosis Date  . Medical history non-contributory   . Anxiety   . Panic attack as reaction to stress   . Anemia   . Depression   . Gallstones     Past Surgical History  Procedure Laterality Date  . Cholecystectomy    . Tonsillectomy    . Incise and drain abcess    . Mouth surgery        Medication List       This list is accurate as of: 02/05/15 12:54 PM.  Always use your most recent med list.               acetaminophen-codeine 300-30 MG per tablet  Commonly known as:  TYLENOL #3  Take 1 tablet by mouth every 8 (eight) hours as needed for moderate pain.     diclofenac sodium 1 % Gel  Commonly known as:  VOLTAREN  Apply 2 g topically 4 (four) times daily.     dicyclomine 20 MG tablet  Commonly known as:  BENTYL  Take 1 tablet (20 mg total) by mouth 2 (two) times daily.     doxylamine (Sleep) 25 MG tablet  Commonly known as:  UNISOM  Take 25 mg by mouth at bedtime as needed for sleep.     gabapentin 100 MG capsule  Commonly known as:  NEURONTIN  Take 1 capsule (100 mg total) by mouth 3 (three) times daily.     meloxicam 7.5 MG tablet  Commonly known as:  MOBIC  Take 1 tablet (7.5 mg total) by mouth daily.     methocarbamol 500 MG tablet  Commonly known as:  ROBAXIN  Take 1 tablet (500 mg total) by mouth every 6 (six) hours as needed for muscle  spasms (and pain).     metoCLOPramide 10 MG tablet  Commonly known as:  REGLAN  Take 1 tablet (10 mg total) by mouth every 8 (eight) hours.     omeprazole 20 MG capsule  Commonly known as:  PRILOSEC  Take 1 capsule (20 mg total) by mouth daily.     oxyCODONE-acetaminophen 10-325 MG per tablet  Commonly known as:  PERCOCET  Take 1 tablet by mouth every 4 (four) hours as needed for pain.     pantoprazole 40 MG tablet  Commonly known as:  PROTONIX  Take 1 tablet (40 mg total) by mouth daily.     promethazine 25 MG tablet  Commonly known as:  PHENERGAN  Take 1 tablet (25 mg total) by mouth every 8 (eight) hours as needed for nausea or vomiting.        Meds ordered this encounter  Medications  . dicyclomine (BENTYL) 20 MG tablet    Sig: Take 1 tablet (20 mg total) by mouth 2 (two) times daily.    Dispense:  20 tablet    Refill:  0  . promethazine (PHENERGAN) injection 25 mg    Sig:      There is no immunization history on file for this patient.  Family History  Problem Relation Age of Onset  . Irritable bowel syndrome Mother   . Ulcerative colitis Maternal Grandfather   . Colon cancer Neg Hx   . Colon polyps Neg Hx   . Kidney disease Mother   . Gallbladder disease Neg Hx   . Esophageal cancer Neg Hx   . Diabetes Neg Hx     History  Substance Use Topics  . Smoking status: Never Smoker   . Smokeless tobacco: Never Used  . Alcohol Use: No    Review of Systems   As noted in HPI  Filed Vitals:   02/05/15 1208  BP: 125/85  Pulse: 103  Temp: 98 F (36.7 C)  Resp: 16    Physical Exam  Physical Exam  Constitutional: No distress.  Cardiovascular: Normal rate and regular rhythm.   Pulmonary/Chest: Breath sounds normal. No respiratory distress. He has no wheezes. He has no rales.  Abdominal:  Minimal tenderness for abdominal deep palpation, no rebound or guarding, bowel sounds positive  Musculoskeletal: He exhibits no edema.    Labs   Lab Results    Component Value Date   WBC 10.0 12/19/2014   HGB 12.3* 12/19/2014   HCT 38.6* 12/19/2014   PLT 298 12/19/2014   GLUCOSE 101* 12/19/2014   CHOL 142 08/19/2013   TRIG 68 08/19/2013   HDL 47 08/19/2013   LDLCALC 81 08/19/2013   ALT 10* 12/19/2014   AST 14* 12/19/2014   NA 137 12/19/2014   K 3.6 12/19/2014   CL 104 12/19/2014   CREATININE 1.07 12/19/2014   BUN 20 12/19/2014   CO2 25 12/19/2014   TSH 0.681 07/31/2013   INR 1.22 07/27/2013    No results found for: HGBA1C   Assessment and Plan  Non-intractable vomiting with nausea, vomiting of unspecified type - Plan: Ambulatory referral to Gastroenterology, promethazine (PHENERGAN) injection 25 mg  Dental cavities - Plan: Ambulatory referral to Dentistry  Gastroesophageal reflux disease, esophagitis presence not specified/Abdominal pain, chronic, right upper quadrant - Plan: Ambulatory referral to Gastroenterology    Return in about 3 months (around 05/08/2015), or if symptoms worsen or fail to improve.   This note has been created with Education officer, environmental. Any transcriptional errors are unintentional.    Doris Cheadle, MD

## 2015-02-05 NOTE — Progress Notes (Signed)
Patient complains of stomach cramping and vomiting On and off since Saturday No fever

## 2015-02-07 ENCOUNTER — Telehealth: Payer: Self-pay | Admitting: Clinical

## 2015-02-07 ENCOUNTER — Encounter: Payer: Self-pay | Admitting: Gastroenterology

## 2015-02-07 ENCOUNTER — Encounter: Payer: Self-pay | Admitting: Physician Assistant

## 2015-02-07 ENCOUNTER — Emergency Department (HOSPITAL_COMMUNITY): Payer: Self-pay

## 2015-02-07 ENCOUNTER — Telehealth: Payer: Self-pay

## 2015-02-07 ENCOUNTER — Telehealth: Payer: Self-pay | Admitting: Internal Medicine

## 2015-02-07 ENCOUNTER — Emergency Department (HOSPITAL_COMMUNITY)
Admission: EM | Admit: 2015-02-07 | Discharge: 2015-02-07 | Disposition: A | Discharge status: Home / Self Care | Payer: Self-pay | Attending: Emergency Medicine | Admitting: Emergency Medicine

## 2015-02-07 ENCOUNTER — Ambulatory Visit (INDEPENDENT_AMBULATORY_CARE_PROVIDER_SITE_OTHER): Payer: Self-pay | Admitting: Physician Assistant

## 2015-02-07 ENCOUNTER — Encounter (HOSPITAL_COMMUNITY): Payer: Self-pay | Admitting: Emergency Medicine

## 2015-02-07 VITALS — BP 140/70 | HR 80 | Ht 67.0 in | Wt 134.4 lb

## 2015-02-07 DIAGNOSIS — R1013 Epigastric pain: Secondary | ICD-10-CM

## 2015-02-07 DIAGNOSIS — Z862 Personal history of diseases of the blood and blood-forming organs and certain disorders involving the immune mechanism: Secondary | ICD-10-CM | POA: Insufficient documentation

## 2015-02-07 DIAGNOSIS — F410 Panic disorder [episodic paroxysmal anxiety] without agoraphobia: Secondary | ICD-10-CM | POA: Insufficient documentation

## 2015-02-07 DIAGNOSIS — R1084 Generalized abdominal pain: Secondary | ICD-10-CM | POA: Insufficient documentation

## 2015-02-07 DIAGNOSIS — G8929 Other chronic pain: Secondary | ICD-10-CM | POA: Insufficient documentation

## 2015-02-07 DIAGNOSIS — Z79899 Other long term (current) drug therapy: Secondary | ICD-10-CM | POA: Insufficient documentation

## 2015-02-07 DIAGNOSIS — F329 Major depressive disorder, single episode, unspecified: Secondary | ICD-10-CM | POA: Insufficient documentation

## 2015-02-07 DIAGNOSIS — R112 Nausea with vomiting, unspecified: Secondary | ICD-10-CM | POA: Insufficient documentation

## 2015-02-07 DIAGNOSIS — R109 Unspecified abdominal pain: Secondary | ICD-10-CM

## 2015-02-07 DIAGNOSIS — Z9049 Acquired absence of other specified parts of digestive tract: Secondary | ICD-10-CM | POA: Insufficient documentation

## 2015-02-07 LAB — URINALYSIS, ROUTINE W REFLEX MICROSCOPIC
BILIRUBIN URINE: NEGATIVE
Glucose, UA: NEGATIVE mg/dL
Ketones, ur: NEGATIVE mg/dL
LEUKOCYTES UA: NEGATIVE
Nitrite: NEGATIVE
PH: 6 (ref 5.0–8.0)
Protein, ur: NEGATIVE mg/dL
Specific Gravity, Urine: 1.021 (ref 1.005–1.030)
Urobilinogen, UA: 0.2 mg/dL (ref 0.0–1.0)

## 2015-02-07 LAB — COMPREHENSIVE METABOLIC PANEL
ALK PHOS: 72 U/L (ref 38–126)
ALT: 13 U/L — AB (ref 17–63)
ANION GAP: 9 (ref 5–15)
AST: 30 U/L (ref 15–41)
Albumin: 4.5 g/dL (ref 3.5–5.0)
BUN: 13 mg/dL (ref 6–20)
CALCIUM: 9.8 mg/dL (ref 8.9–10.3)
CHLORIDE: 106 mmol/L (ref 101–111)
CO2: 25 mmol/L (ref 22–32)
CREATININE: 0.93 mg/dL (ref 0.61–1.24)
GFR calc non Af Amer: >60 mL/min (ref >60)
GLUCOSE: 101 mg/dL — AB (ref 65–99)
Potassium: 3.7 mmol/L (ref 3.5–5.1)
Sodium: 140 mmol/L (ref 135–145)
TOTAL PROTEIN: 8.3 g/dL — AB (ref 6.5–8.1)
Total Bilirubin: 0.1 mg/dL — ABNORMAL LOW (ref 0.3–1.2)

## 2015-02-07 LAB — CBC
HCT: 39.2 % (ref 39.0–52.0)
Hemoglobin: 12.6 g/dL — ABNORMAL LOW (ref 13.0–17.0)
MCH: 23.8 pg — AB (ref 26.0–34.0)
MCHC: 32.1 g/dL (ref 30.0–36.0)
MCV: 74.1 fL — ABNORMAL LOW (ref 78.0–100.0)
PLATELETS: 257 10*3/uL (ref 150–400)
RBC: 5.29 MIL/uL (ref 4.22–5.81)
RDW: 15.8 % — ABNORMAL HIGH (ref 11.5–15.5)
WBC: 12.5 10*3/uL — ABNORMAL HIGH (ref 4.0–10.5)

## 2015-02-07 LAB — URINE MICROSCOPIC-ADD ON

## 2015-02-07 LAB — LIPASE, BLOOD: LIPASE: 16 U/L — AB (ref 22–51)

## 2015-02-07 MED ORDER — ONDANSETRON HCL 4 MG/2ML IJ SOLN: 4.0000 mg | Freq: Once | INTRAMUSCULAR | Status: DC | PRN

## 2015-02-07 MED ORDER — ONDANSETRON HCL 8 MG PO TABS: 8.0000 mg | ORAL_TABLET | Freq: Three times a day (TID) | ORAL | Status: DC | PRN

## 2015-02-07 MED ORDER — ONDANSETRON HCL 4 MG/2ML IJ SOLN
4.0000 mg | Freq: Once | INTRAMUSCULAR | Status: AC
  Administered 2015-02-07: 4 mg via INTRAVENOUS
  Filled 2015-02-07: qty 2

## 2015-02-07 MED ORDER — SODIUM CHLORIDE 0.9 % IV BOLUS (SEPSIS)
2000.0000 mL | Freq: Once | INTRAVENOUS | Status: AC
  Administered 2015-02-07: 2000 mL via INTRAVENOUS

## 2015-02-07 MED ORDER — SUCRALFATE 1 GM/10ML PO SUSP: 1.0000 g | Freq: Four times a day (QID) | ORAL | Status: DC

## 2015-02-07 NOTE — Progress Notes (Signed)
I agree with the above note, plan.  He will need to be done at [facility] hospital, MAC sedation. He was cursing, belligerent in the office and I think it is safest if this be done at [facility], during my next available EUS Thursday.

## 2015-02-07 NOTE — Telephone Encounter (Signed)
I have the discharge form on your desk to sign as well.

## 2015-02-07 NOTE — Telephone Encounter (Signed)
F/u w pt; pt does not know why his appointment has been cancelled with the gastroenterologist appointment. [facility] will attempt to figure out what is going on, and see if he is able to obtain another appointment.

## 2015-02-07 NOTE — ED Notes (Signed)
Per pt, states chronic abdominal pain-had gallbladder removed in 2011-was at [facility] this am and was told to come here for his pain-security had to be called for him to be removed

## 2015-02-07 NOTE — ED Notes (Signed)
Unable to obtain d/c vitals, due to Pt leaving.

## 2015-02-07 NOTE — Progress Notes (Addendum)
Patient ID: Levi Palmer, male   DOB: [birth year], 29 y.o.   MRN: [id number]     History of Present Illness: Levi Palmer is a 29 year old male who has been previously evaluated by Dr. Christella Hartigan in January 2016. Apparently, he was hospitalized in January 2015 with nausea, vomiting and abdominal pain. Imaging by CT suggested a small amount of pneumomediastinum. [facility] gastroenterology was consult and followed him for several days. Further imaging showed no sign of esophageal perforation, and they advised against upper endoscopic evaluation given possible recent esophageal perforation. Since that time he has had numerous readmissions with a variety of abdominal complaints.he had an MR of the abdomen with MRCP on 08/18/2014. It had no evidence of biliary ductal dilatation or other significant abnormality. No liver masses were identified. The pancreas had no masses, inflammatory changes, or fluid collections demonstrated. No evidence of pancreatic ductal dilatation or pancreas divisum. Unfortunately, the patient has never followed up in the office and has never called since that visit to let us know he continues to have pain, nausea, and intermittent vomiting. He has had numerous visits to the emergency room as well as to internal medicine for nausea and vomiting. He complains of stomach and neck pain. He states that each time he goes to the bathroom he has waves of nausea. He reports that he has been unable to tolerate Reglan because it makes him very anxious and shaky. He states Zofran does not provide him any relief. H e has been using Tylenol with Codeine and Phenergan which she says provides him transient relief. He is extremely agitated and frustrated and says he has had this abdominal pain for 7 years. He reports that he had an endoscopy in [facility] those doctors couldn't find out what was wrong with me". He states "since I am poor I get (expletive) care. No one (expletive) listens to me or helps me."  Patient voices extreme displeasure that no one from the office has called him frequently to see how he is feeling.(Patient has not called office since February). Patient denies bright red blood per rectum or melena.   Past Medical History  Diagnosis Date  . Medical history non-contributory   . Anxiety   . Panic attack as reaction to stress   . Anemia   . Depression   . Gallstones     Past Surgical History  Procedure Laterality Date  . Cholecystectomy    . Tonsillectomy    . Incise and drain abcess    . Mouth surgery     Family History  Problem Relation Age of Onset  . Irritable bowel syndrome Mother   . Ulcerative colitis Maternal Grandfather   . Colon cancer Neg Hx   . Colon polyps Neg Hx   . Kidney disease Mother   . Gallbladder disease Neg Hx   . Esophageal cancer Neg Hx   . Diabetes Neg Hx    History  Substance Use Topics  . Smoking status: Never Smoker   . Smokeless tobacco: Never Used  . Alcohol Use: No   Current Outpatient Prescriptions  Medication Sig Dispense Refill  . acetaminophen-codeine (TYLENOL #3) 300-30 MG per tablet Take 1 tablet by mouth every 8 (eight) hours as needed for moderate pain. 30 tablet 0  . dicyclomine (BENTYL) 20 MG tablet Take 1 tablet (20 mg total) by mouth 2 (two) times daily. (Patient not taking: Reported on 02/07/2015) 20 tablet 0  . doxylamine, Sleep, (UNISOM) 25 MG tablet Take 50 mg by  mouth at bedtime as needed for sleep.     Marland Kitchen omeprazole (PRILOSEC) 20 MG capsule Take 1 capsule (20 mg total) by mouth daily. (Patient taking differently: Take 20 mg by mouth daily as needed. ) 30 capsule 3  . oxyCODONE-acetaminophen (PERCOCET) 10-325 MG per tablet Take 1 tablet by mouth every 4 (four) hours as needed for pain. (Patient not taking: Reported on 02/07/2015) 30 tablet 0  . pantoprazole (PROTONIX) 40 MG tablet Take 1 tablet (40 mg total) by mouth daily. 30 tablet 3  . promethazine (PHENERGAN) 25 MG tablet Take 1 tablet (25 mg total) by mouth  every 8 (eight) hours as needed for nausea or vomiting. 30 tablet 1  . ondansetron (ZOFRAN) 8 MG tablet Take 1 tablet (8 mg total) by mouth every 8 (eight) hours as needed for nausea or vomiting. 40 tablet 0  . sucralfate (CARAFATE) 1 GM/10ML suspension Take 10 mLs (1 g total) by mouth 4 (four) times daily. (Patient not taking: Reported on 02/07/2015) 420 mL 0   No current facility-administered medications for this visit.   Facility-Administered Medications Ordered in Other Visits  Medication Dose Route Frequency Provider Last Rate Last Dose  . ondansetron (ZOFRAN) injection 4 mg  4 mg Intravenous Once PRN Courteney Lyn Mackuen, MD       Allergies  Allergen Reactions  . Tramadol Other (See Comments)    Jittery   . Zoloft [Sertraline Hcl] Palpitations      Review of Systems: Gen: Says he feels terrible all the time CV: Denies chest pain, angina, palpitations, syncope, orthopnea, PND, peripheral edema, and claudication. Resp: Denies dyspnea at rest, dyspnea with exercise, cough, sputum, wheezing, coughing up blood, and pleurisy. GI: Complains of diffuse abdominal pain with intermittent nausea and vomiting GU : Denies urinary burning, blood in urine, urinary frequency, urinary hesitancy, nocturnal urination, and urinary incontinence. MS: Complains of neck and back pain Derm: Denies rash, itching, dry skin, hives, moles, warts, or unhealing ulcers.      Physical Exam: General: Alert, unkempt, belligerent male who uses foul  language and paces in the room alternating with lying on the stretcher moaning in pain Head: Normocephalic and atraumatic Eyes:  sclerae anicteric, conjunctiva pink  Ears: Normal auditory acuity Lungs: Clear throughout to auscultation Heart: Regular rate and rhythm Abdomen: Soft, non distended, . No mass,  diffuse tenderness with no rebound or guarding s, no hepatomegaly. Normal bowel sounds. Musculoskeletal: Symmetrical with no gross deformities  Extremities: No  edema  Neurological: Alert oriented x 4 Psychological:  Alert, belligerent, agitated.  Assessment and Recommendations: 29 year old male with ongoing complaints of abdominal pain associated with nausea and intermittent vomiting. An extensive amount of time was spent with the patient trying to review previous results, however the patient continually interrupts and voices displeasure with all providers he has seen at this office, the emergency room, and primary care. We have discussed EGD to evaluate for possible gastritis, ulcer etc.The risks, benefits, and alternatives to endoscopy with possible biopsy and possible dilation were discussed with the patient and they consent to proceed. The patient voiced extreme displeasure. Security was called to the office as the patient was belligerent and staff felt threatened.        Mckala Pantaleon, Tollie Pizza PA-C 02/07/2015,

## 2015-02-07 NOTE — ED Notes (Signed)
Pt aware of need for UA, will alarm staff upon urination

## 2015-02-07 NOTE — Telephone Encounter (Signed)
I contacted the patient to reschedule to [facility] for 02/15/2015. Patient has refused to reschedule his procedure to [facility]. Stated his girlfriend has already requested the original date of 8/15 in [employer] off of work to bring him. She was fussing in the background saying she has already took the 15th off of work.  Patient stated he did not want Dr Christella Hartigan as a doctor while he was in the office today. But decided to go along with having the procedure with him in the [facility]

## 2015-02-07 NOTE — Patient Instructions (Signed)
You have been scheduled for an endoscopy. Please follow written instructions given to you at your visit today. If you use inhalers (even only as needed), please bring them with you on the day of your procedure. Your physician has requested that you go to www.startemmi.com and enter the access code given to you at your visit today. This web site gives a general overview about your procedure. However, you should still follow specific instructions given to you by our office regarding your preparation for the procedure.  Continue Pantoprazole but take it first thing in the morning on an empty stomach Your prescription will be sent to your pharmacy

## 2015-02-07 NOTE — Telephone Encounter (Signed)
After further review, I am not comfortable taking care of him.  We have had to call security twice on him. He clearly stated he "doesn't want me as a doctor."    As we discussed on the phone initiate immediate termination action, letter.

## 2015-02-07 NOTE — Telephone Encounter (Signed)
The pt was called and advised that his procedure was cancelled and he began to scream at me stating that he wants Dr Christella Hartigan to "get his panty ass on the phone and be a man and stop sending his little girls to do his job".  I advised he would get emergency care for 30 days and a letter would be sent out to him.  He again screaming at me demanded to put Dr Christella Hartigan on the phone.  I told him he would receive a letter and hung up.

## 2015-02-07 NOTE — ED Notes (Signed)
Pt c/o chronic generalized abdominal pain and emesis.  Sts RUQ hurts worse w/ palpation.  Pt has taken nothing for the pain, but took "half a phenergan" for emesis.   Pt is followed by [facility] and had an appointment this morning.  Sts "they said that they couldn't do anything for me and that I needed to come to the ED.  They yelled at me because they keep rescheduling an endoscopy, but no one has called me.  They told me that I need to take accountability, but they get paid to call me, I don't get paid to call them.  I don't need an endoscopy.  I just had one in 2011.  They want me to request my files from where I used to live, but it's not my job.  I don't think it's upper anyway, I think it's lower.  I think, it's my colon.  They won't listen to me.  CTs and MRIs don't show anything and I know that.  They do just to opposite of what I ask.  I also wanted a different doctor, but they won't give me anyone by Dr. Christella Hartigan.  They say everyone else is booked up."  Per Security, Pt had to be escorted out of the office by security.

## 2015-02-07 NOTE — ED Provider Notes (Signed)
CSN: [id number]     Arrival date & time 02/07/15  0958 History   First MD Initiated Contact with Patient 02/07/15 1024     Chief Complaint  Patient presents with  . Abdominal Pain     (Consider location/radiation/quality/duration/timing/severity/associated sxs/prior Treatment) HPI Complains of vomiting multiple times for the past 2 days. Denies hematemesis. He is treated himself with Phenergan this morning. He presently complains of nausea since taking Phenergan this morning. He also complains of diffuse abdominal pain which she suffered from since 2007. He denies fever. Last bowel movement this morning. Patient reports that he ate toast yesterday. Denies fever. Nothing makes pain better or worse. Having a bowel movement causes nausea to become worse.  Past Medical History  Diagnosis Date  . Medical history non-contributory   . Anxiety   . Panic attack as reaction to stress   . Anemia   . Depression   . Gallstones    Past Surgical History  Procedure Laterality Date  . Cholecystectomy    . Tonsillectomy    . Incise and drain abcess    . Mouth surgery     Family History  Problem Relation Age of Onset  . Irritable bowel syndrome Mother   . Ulcerative colitis Maternal Grandfather   . Colon cancer Neg Hx   . Colon polyps Neg Hx   . Kidney disease Mother   . Gallbladder disease Neg Hx   . Esophageal cancer Neg Hx   . Diabetes Neg Hx    History  Substance Use Topics  . Smoking status: Never Smoker   . Smokeless tobacco: Never Used  . Alcohol Use: No    Review of Systems  Constitutional: Negative.   HENT: Negative.   Respiratory: Negative.   Cardiovascular: Negative.   Gastrointestinal: Positive for nausea, vomiting and abdominal pain.  Musculoskeletal: Negative.   Skin: Negative.   Neurological: Negative.   Psychiatric/Behavioral: Negative.   All other systems reviewed and are negative.     Allergies  Tramadol and Zoloft  Home Medications   Prior to Admission  medications   Medication Sig Start Date End Date Taking? Authorizing Provider  acetaminophen-codeine (TYLENOL #3) 300-30 MG per tablet Take 1 tablet by mouth every 8 (eight) hours as needed for moderate pain. 01/29/15   Doris Cheadle, MD  dicyclomine (BENTYL) 20 MG tablet Take 1 tablet (20 mg total) by mouth 2 (two) times daily. 02/05/15   Doris Cheadle, MD  doxylamine, Sleep, (UNISOM) 25 MG tablet Take 25 mg by mouth at bedtime as needed for sleep.    Historical Provider, MD  omeprazole (PRILOSEC) 20 MG capsule Take 1 capsule (20 mg total) by mouth daily. Patient taking differently: Take 20 mg by mouth daily as needed.  01/29/15   Doris Cheadle, MD  oxyCODONE-acetaminophen (PERCOCET) 10-325 MG per tablet Take 1 tablet by mouth every 4 (four) hours as needed for pain. 01/04/15   Vivianne Master, PA-C  pantoprazole (PROTONIX) 40 MG tablet Take 1 tablet (40 mg total) by mouth daily. 10/26/14   Doris Cheadle, MD  promethazine (PHENERGAN) 25 MG tablet Take 1 tablet (25 mg total) by mouth every 8 (eight) hours as needed for nausea or vomiting. 01/29/15   Doris Cheadle, MD  sucralfate (CARAFATE) 1 GM/10ML suspension Take 10 mLs (1 g total) by mouth 4 (four) times daily. 02/07/15   Lori P Hvozdovic, PA-C   BP 133/85 mmHg  Pulse 96  Temp(Src) 97.9 F (36.6 C) (Oral)  Resp 18  SpO2 100%  Physical Exam  Constitutional: He is oriented to person, place, and time. He appears well-developed and well-nourished. No distress.  HENT:  Head: Normocephalic and atraumatic.  Eyes: Conjunctivae are normal. Pupils are equal, round, and reactive to light.  Neck: Neck supple. No tracheal deviation present. No thyromegaly present.  Cardiovascular: Normal rate and regular rhythm.   No murmur heard. Pulmonary/Chest: Effort normal and breath sounds normal.  Abdominal: Soft. Bowel sounds are normal. He exhibits no distension and no mass. There is tenderness. There is no rebound and no guarding.  Diffusely tender  Genitourinary:  Penis normal.  Musculoskeletal: Normal range of motion. He exhibits no edema or tenderness.  Neurological: He is alert and oriented to person, place, and time. No cranial nerve deficit. Coordination normal.  Skin: Skin is warm and dry. No rash noted.  Psychiatric: He has a normal mood and affect.  Nursing note and vitals reviewed.  Patient was able to drink water and nausea improved after treatment with intravenous Zofran. He is able to drink water without vomiting ED Course  Procedures (including critical care time) Labs Review Labs Reviewed  LIPASE, BLOOD  COMPREHENSIVE METABOLIC PANEL  CBC  URINALYSIS, ROUTINE W REFLEX MICROSCOPIC (NOT AT [facility])   x-rays reviewed by me  Imaging Review No results found.  EKG Interpretation None     after treatment with intravenous Zofran and intravenous fluids. Patient is able to drink water without difficulty. Results for orders placed or performed during the hospital encounter of 02/07/15  Lipase, blood  Result Value Ref Range   Lipase 16 (L) 22 - 51 U/L  Comprehensive metabolic panel  Result Value Ref Range   Sodium 140 135 - 145 mmol/L   Potassium 3.7 3.5 - 5.1 mmol/L   Chloride 106 101 - 111 mmol/L   CO2 25 22 - 32 mmol/L   Glucose, Bld 101 (H) 65 - 99 mg/dL   BUN 13 6 - 20 mg/dL   Creatinine, Ser 6.96 0.61 - 1.24 mg/dL   Calcium 9.8 8.9 - 29.5 mg/dL   Total Protein 8.3 (H) 6.5 - 8.1 g/dL   Albumin 4.5 3.5 - 5.0 g/dL   AST 30 15 - 41 U/L   ALT 13 (L) 17 - 63 U/L   Alkaline Phosphatase 72 38 - 126 U/L   Total Bilirubin 0.1 (L) 0.3 - 1.2 mg/dL   GFR calc non Af Amer >60 >60 mL/min   GFR calc Af Amer >60 >60 mL/min   Anion gap 9 5 - 15  CBC  Result Value Ref Range   WBC 12.5 (H) 4.0 - 10.5 K/uL   RBC 5.29 4.22 - 5.81 MIL/uL   Hemoglobin 12.6 (L) 13.0 - 17.0 g/dL   HCT 28.4 13.2 - 44.0 %   MCV 74.1 (L) 78.0 - 100.0 fL   MCH 23.8 (L) 26.0 - 34.0 pg   MCHC 32.1 30.0 - 36.0 g/dL   RDW 10.2 (H) 72.5 - 36.6 %   Platelets 257 150  - 400 K/uL  Urinalysis, Routine w reflex microscopic (not at [facility])  Result Value Ref Range   Color, Urine YELLOW YELLOW   APPearance TURBID (A) CLEAR   Specific Gravity, Urine 1.021 1.005 - 1.030   pH 6.0 5.0 - 8.0   Glucose, UA NEGATIVE NEGATIVE mg/dL   Hgb urine dipstick LARGE (A) NEGATIVE   Bilirubin Urine NEGATIVE NEGATIVE   Ketones, ur NEGATIVE NEGATIVE mg/dL   Protein, ur NEGATIVE NEGATIVE mg/dL   Urobilinogen, UA 0.2 0.0 -  1.0 mg/dL   Nitrite NEGATIVE NEGATIVE   Leukocytes, UA NEGATIVE NEGATIVE  Urine microscopic-add on  Result Value Ref Range   WBC, UA 3-6 <3 WBC/hpf   RBC / HPF 3-6 <3 RBC/hpf   Bacteria, UA FEW (A) RARE   Urine-Other MUCOUS PRESENT    Dg Abd Acute W/chest  02/07/2015   CLINICAL DATA:  Abdominal pain with nausea and vomiting for 5 days  EXAM: DG ABDOMEN ACUTE W/ 1V CHEST  COMPARISON:  August 07, 2014  FINDINGS: PA chest: Lungs are clear. Heart size and pulmonary vascularity are normal. No adenopathy.  Supine and upright abdomen: There is no appreciable bowel dilatation. There are scattered air-fluid levels. There is moderate stool in the colon. No free air. There are surgical clips in the right upper quadrant. There is a small phlebolith in the left pelvis.  IMPRESSION: Scattered air-fluid levels. Question a degree of enteritis or early ileus. Obstruction is not felt to be likely. No free air. Lungs clear.   Electronically Signed   By: Bretta Bang III M.D.   On: 02/07/2015 11:04    MDM  Doubt ileus. Patient has bowel sounds. Final diagnoses:  None   Plan prescription Zofran. He is encouraged to keep his scheduled appointment for endoscopy on 01/19/2015 Diagnoses #1 chronic abdominal pain #2 nausea and vomiting #3 microscopic hwematuria     Doug Sou, MD 02/07/15 1312

## 2015-02-07 NOTE — Telephone Encounter (Signed)
PER DR JACOBS  He will need to be done at [facility]. He was cursing, belligerent in the office and I think it is safest if this be done at [facility], during my next available EUS Thursday.

## 2015-02-07 NOTE — Telephone Encounter (Signed)
Patient called to speak to a nurse in regards to his stomach pain, patient stated that he went to the ED on 8/3 and was not given a resolution. Please f/u with pt.

## 2015-02-07 NOTE — Discharge Instructions (Signed)
Keep your scheduled appointment to get your endoscopy on 02/19/2015. You can take the medication prescribed day instead of Phenergan or together with Phenergan to help with nausea and vomiting. You have trace microscopic amounts of blood in your urine today. Ask your physician at the [facility] to recheck your urine within the next few weeks.

## 2015-02-07 NOTE — ED Notes (Signed)
It was reported to this RN that the Pt got mad and wanted to leave.  Liza EMT removed his IV and the Pt left prior to d/c.

## 2015-02-08 ENCOUNTER — Telehealth: Payer: Self-pay

## 2015-02-08 NOTE — Telephone Encounter (Signed)
Levi Palmer from the pharmacy called and requested to substitute Carafate tablets instead of the slurry.  Since patient was just discharged from practice by Dr. Christella Hartigan, I checked with Lawson Fiscal to make sure it was ok for him to get the rx given during his appointment.  She said to go ahead and give him the tablets but no refills.  I told Levi Palmer who will make the change.

## 2015-02-09 ENCOUNTER — Telehealth: Payer: Self-pay | Admitting: Internal Medicine

## 2015-02-09 ENCOUNTER — Telehealth: Payer: Self-pay | Admitting: Clinical

## 2015-02-09 DIAGNOSIS — G8929 Other chronic pain: Secondary | ICD-10-CM

## 2015-02-09 DIAGNOSIS — R1013 Epigastric pain: Principal | ICD-10-CM

## 2015-02-09 MED ORDER — ACETAMINOPHEN-CODEINE #3 300-30 MG PO TABS: 1.0000 | ORAL_TABLET | Freq: Three times a day (TID) | ORAL | Status: DC | PRN

## 2015-02-09 NOTE — Telephone Encounter (Signed)
Pt was referred to Endoscopy but never received a call. Please follow up with patient and provide him with all the details as the patient has been struggling with this since January.

## 2015-02-09 NOTE — Telephone Encounter (Signed)
Pt is requesting a refill on acetaminophen-codeine (TYLENOL #3) 300-30 MG per tablet. Please follow up with pt. Thank you.

## 2015-02-09 NOTE — Telephone Encounter (Signed)
Let pt know that if [facility] is able to send a referral for him to see another gastroenterologist, he will receive a call from our referral coordinator; pt agreeable to this.

## 2015-02-09 NOTE — Telephone Encounter (Signed)
Pt had an appointment with [facility] gastroenterology 8/3  and according the notes he is been dismiss from the practice

## 2015-02-12 ENCOUNTER — Telehealth: Payer: Self-pay | Admitting: Gastroenterology

## 2015-02-12 NOTE — Telephone Encounter (Signed)
rx refill on 02/09/2015

## 2015-02-12 NOTE — Telephone Encounter (Signed)
Patient dismissed from [facility] by Rob Bunting MD , effective February 07, 2015. Dismissal letter sent out by certified / registered mail.  DAJ  Received signed domestic return receipt verifying delivery of certified letter on February 14, 2015. Article number [phone] [id number]

## 2015-02-19 ENCOUNTER — Encounter: Payer: Self-pay | Admitting: Gastroenterology

## 2015-02-21 ENCOUNTER — Encounter (HOSPITAL_COMMUNITY): Payer: Self-pay | Admitting: Emergency Medicine

## 2015-02-21 ENCOUNTER — Emergency Department (INDEPENDENT_AMBULATORY_CARE_PROVIDER_SITE_OTHER)
Admission: EM | Admit: 2015-02-21 | Discharge: 2015-02-21 | Disposition: A | Discharge status: Home / Self Care | Payer: Self-pay | Source: Home / Self Care | Attending: Emergency Medicine | Admitting: Emergency Medicine

## 2015-02-21 ENCOUNTER — Emergency Department (INDEPENDENT_AMBULATORY_CARE_PROVIDER_SITE_OTHER): Payer: Self-pay

## 2015-02-21 DIAGNOSIS — K219 Gastro-esophageal reflux disease without esophagitis: Secondary | ICD-10-CM

## 2015-02-21 DIAGNOSIS — R1084 Generalized abdominal pain: Secondary | ICD-10-CM

## 2015-02-21 MED ORDER — CIPROFLOXACIN HCL 500 MG PO TABS: 500.0000 mg | ORAL_TABLET | Freq: Two times a day (BID) | ORAL | Status: DC

## 2015-02-21 MED ORDER — OMEPRAZOLE 20 MG PO CPDR: 20.0000 mg | DELAYED_RELEASE_CAPSULE | Freq: Every day | ORAL | Status: DC

## 2015-02-21 MED ORDER — METRONIDAZOLE 500 MG PO TABS: 500.0000 mg | ORAL_TABLET | Freq: Two times a day (BID) | ORAL | Status: DC

## 2015-02-21 NOTE — Discharge Instructions (Signed)
I'm sorry you are having pain. We are going to try a course of antibiotics. Take Cipro and Flagyl twice a day for 10 days. Take the omeprazole daily for the next 2 weeks, then as needed. Follow-up as needed.

## 2015-02-21 NOTE — ED Notes (Signed)
Pt here today for complaint of abdominal pain that is worse than his chronic issue.  He has RUQ pain all the time that is being followed by [facility].  Pt states the pain is now radiating to his mid, upper abdomen with sharp pains.  He states he gets nausea with every bowel movement.  He wants a colonoscopy but he states his doctors won't let him have one.  He thinks he has a lower GI issue, not an upper.  Pt has had scans and an EGD in the past along with his Gall Bladder removed.  Pt states he is in 10/10 pain and he is sitting on the chair in a guarded position.

## 2015-02-21 NOTE — ED Provider Notes (Signed)
CSN: [id number]     Arrival date & time 02/21/15  1701 History   First MD Initiated Contact with Patient 02/21/15 1722     Chief Complaint  Patient presents with  . Abdominal Pain   (Consider location/radiation/quality/duration/timing/severity/associated sxs/prior Treatment) HPI  He is a 29 year old man here for evaluation of abdominal pain. He states for the last 6 and half years he has had a constant crampy right upper quadrant pain. In the last few days he has developed a sharp pain in the right upper quadrant and epigastric area that is new. He describes feeling nauseated. He did have some vomiting yesterday. He states the nausea is typically triggered by having a bowel movement. He reports issues with constipation. He did have a small hard bowel movement today with some specks of blood. His last large her bowel movement was 2 days ago. He has not been able to eat much the last few days due to nausea. No fevers or chills. He has taken Phenergan which provides some relief. He has also tried Zofran which does not help.    Past Medical History  Diagnosis Date  . Medical history non-contributory   . Anxiety   . Panic attack as reaction to stress   . Anemia   . Depression   . Gallstones    Past Surgical History  Procedure Laterality Date  . Cholecystectomy    . Tonsillectomy    . Incise and drain abcess    . Mouth surgery     Family History  Problem Relation Age of Onset  . Irritable bowel syndrome Mother   . Ulcerative colitis Maternal Grandfather   . Colon cancer Neg Hx   . Colon polyps Neg Hx   . Kidney disease Mother   . Gallbladder disease Neg Hx   . Esophageal cancer Neg Hx   . Diabetes Neg Hx    Social History  Substance Use Topics  . Smoking status: Never Smoker   . Smokeless tobacco: Never Used  . Alcohol Use: No    Review of Systems  Allergies  Tramadol and Zoloft  Home Medications   Prior to Admission medications   Medication Sig Start Date End Date  Taking? Authorizing Provider  aspirin-acetaminophen-caffeine (EXCEDRIN MIGRAINE) [phone] MG per tablet Take 2 tablets by mouth every 6 (six) hours as needed for headache.   Yes Historical Provider, MD  ondansetron (ZOFRAN) 8 MG tablet Take 1 tablet (8 mg total) by mouth every 8 (eight) hours as needed for nausea or vomiting. 02/07/15  Yes Doug Sou, MD  promethazine (PHENERGAN) 25 MG tablet Take 1 tablet (25 mg total) by mouth every 8 (eight) hours as needed for nausea or vomiting. 01/29/15  Yes Doris Cheadle, MD  acetaminophen-codeine (TYLENOL #3) 300-30 MG per tablet Take 1 tablet by mouth every 8 (eight) hours as needed for moderate pain. 02/09/15   Doris Cheadle, MD  ciprofloxacin (CIPRO) 500 MG tablet Take 1 tablet (500 mg total) by mouth 2 (two) times daily. 02/21/15   Charm Rings, MD  doxylamine, Sleep, (UNISOM) 25 MG tablet Take 50 mg by mouth at bedtime as needed for sleep.     Historical Provider, MD  metroNIDAZOLE (FLAGYL) 500 MG tablet Take 1 tablet (500 mg total) by mouth 2 (two) times daily. 02/21/15   Charm Rings, MD  omeprazole (PRILOSEC) 20 MG capsule Take 1 capsule (20 mg total) by mouth daily. For 2 weeks, then as needed 02/21/15   Charm Rings, MD  BP 117/74 mmHg  Pulse 104  Temp(Src) 97.7 F (36.5 C) (Oral)  Resp 16  SpO2 98% Physical Exam  Constitutional: He is oriented to person, place, and time. He appears well-developed and well-nourished. No distress.  Neck: Neck supple.  Cardiovascular: Normal rate, regular rhythm and normal heart sounds.   No murmur heard. Pulmonary/Chest: Effort normal and breath sounds normal. No respiratory distress. He has no wheezes. He has no rales.  Abdominal: Soft. Bowel sounds are normal. He exhibits no distension and no mass. There is tenderness (in right upper quadrant and epigastric). There is no rebound and no guarding.  Neurological: He is alert and oriented to person, place, and time.    ED Course  Procedures (including  critical care time) Labs Review Labs Reviewed - No data to display  Imaging Review Dg Abd 1 View  02/21/2015   CLINICAL DATA:  Nausea, vomiting and diarrhea for 3 days. Initial encounter.  EXAM: ABDOMEN - 1 VIEW  COMPARISON:  Chest in two views abdomen 02/07/2015.  FINDINGS: The bowel gas pattern is normal. Dilated loops of small bowel seen on the comparison examination are no longer present. No radio-opaque calculi or other significant radiographic abnormality are seen.  IMPRESSION: Negative exam.   Electronically Signed   By: Drusilla Kanner M.D.   On: 02/21/2015 18:12     MDM   1. Generalized abdominal pain   2. Gastroesophageal reflux disease, esophagitis presence not specified    He is a 29 year old man with a long history of GI issues. Is acutely worsened over the last several days. Abdominal film is negative. He states the best he has felt a long time was after getting treated for H. Pylori. I have reviewed his chart. The only abnormal imaging study with a CT scan from February 2016 that showed some lymphadenopathy and thickened bowel walls. At that time he was treated with prednisone, which he states helped somewhat. We will treat with a course of Cipro and Flagyl. Also omeprazole for 2 weeks. Follow-up as needed.     Charm Rings, MD 02/21/15 [phone]

## 2015-02-26 ENCOUNTER — Ambulatory Visit: Payer: Self-pay | Attending: Family Medicine | Admitting: Family Medicine

## 2015-02-26 ENCOUNTER — Encounter: Payer: Self-pay | Admitting: Family Medicine

## 2015-02-26 VITALS — BP 144/85 | HR 85 | Temp 98.2°F | Resp 16 | Ht 67.0 in | Wt 137.0 lb

## 2015-02-26 DIAGNOSIS — K088 Other specified disorders of teeth and supporting structures: Secondary | ICD-10-CM

## 2015-02-26 DIAGNOSIS — R112 Nausea with vomiting, unspecified: Secondary | ICD-10-CM

## 2015-02-26 DIAGNOSIS — K0889 Other specified disorders of teeth and supporting structures: Secondary | ICD-10-CM

## 2015-02-26 DIAGNOSIS — R109 Unspecified abdominal pain: Secondary | ICD-10-CM

## 2015-02-26 DIAGNOSIS — G8929 Other chronic pain: Secondary | ICD-10-CM

## 2015-02-26 DIAGNOSIS — M65341 Trigger finger, right ring finger: Secondary | ICD-10-CM

## 2015-02-26 DIAGNOSIS — F1290 Cannabis use, unspecified, uncomplicated: Secondary | ICD-10-CM

## 2015-02-26 DIAGNOSIS — S62618P Displaced fracture of proximal phalanx of other finger, subsequent encounter for fracture with malunion: Secondary | ICD-10-CM

## 2015-02-26 MED ORDER — PROMETHAZINE HCL 25 MG PO TABS: 25.0000 mg | ORAL_TABLET | Freq: Three times a day (TID) | ORAL | Status: DC | PRN

## 2015-02-26 MED ORDER — ACETAMINOPHEN-CODEINE #3 300-30 MG PO TABS: 1.0000 | ORAL_TABLET | Freq: Three times a day (TID) | ORAL | Status: DC | PRN

## 2015-02-26 MED ORDER — HYOSCYAMINE SULFATE 0.125 MG PO TABS: 0.1250 mg | ORAL_TABLET | Freq: Four times a day (QID) | ORAL | Status: DC | PRN

## 2015-02-26 MED ORDER — POLYETHYLENE GLYCOL 3350 17 GM/SCOOP PO POWD: 17.0000 g | Freq: Every day | ORAL | Status: DC

## 2015-02-26 MED ORDER — METRONIDAZOLE 500 MG PO TABS: 500.0000 mg | ORAL_TABLET | Freq: Two times a day (BID) | ORAL | Status: DC

## 2015-02-26 NOTE — Patient Instructions (Addendum)
Levi Palmer,  Thank you for coming in today  1. Dental pain:  Referral placed  2. Chronic abdominal pain; Sounds like IBS   Keep appt with GI PEG (miralax) daily as needed for constipation  Try levsin up to 4 times daily as needed Phenergan ordered Tylenol #3 for chronic pain  F/u in 4 weeks with me   Dr. Armen Pickup

## 2015-02-26 NOTE — Progress Notes (Signed)
Establish Care with new PCP Abdominal pain, nausea, vomiting  No changes since last visit Paint scale # 8

## 2015-02-26 NOTE — Progress Notes (Signed)
Subjective:    Patient ID: Levi Palmer, male    DOB: September 29, 1985, 29 y.o.   MRN: [id number] CC: chronic abdominal pain  HPI 89 yo M presents with his girlfriend and child for following:  1. Abdominal pain: chronic pain in abdomen x 6 years. Pain with sensation of fecal urgency.  Bowels range from diarrhea to constipation. Tends to be more constipated. No blood in stool. Has never had a colonoscopy. Has been treated for H. Pylori in the past. Currently constipated. No fever, chills, emesis. Currently in between GI specialist. Has tried bentyl in past w/o relief. He went to urgent care last week. Normal abdominal plain film. Started on cipro and flagyl for ? Colitis.   2. THC use: reviewed last + UDS with patient.  He admits to [facility] use. He reports that [facility] helps with nausea and abdominal pain more that any other Rx given. He reports opiods were prescribed.He denies other drug use. He was give GI cocktail on 07/12/2014 prior to UDS done on 07/14/2014.   3. R finger fracture: R 4th finger fractured in May 2015. Healed now but there is significant deformity with decreased ROM. He request referral to determine of the finger can be reset.    4. Dental pain: had hx of abscesses and dental pain. Does not have a dental home. No swelling now. Request dental referral.   Social History  Substance Use Topics  . Smoking status: Never Smoker   . Smokeless tobacco: Never Used  . Alcohol Use: No    Review of Systems  Constitutional: Negative for fever, chills, fatigue and unexpected weight change.  Eyes: Negative for visual disturbance.  Respiratory: Negative for cough and shortness of breath.   Cardiovascular: Negative for chest pain, palpitations and leg swelling.  Gastrointestinal: Positive for nausea, abdominal pain, diarrhea and constipation. Negative for vomiting, blood in stool, abdominal distention and rectal pain.  Endocrine: Negative for polydipsia, polyphagia and polyuria.  Musculoskeletal:  Positive for arthralgias. Negative for myalgias, back pain, gait problem and neck pain.       Pain in knees   Skin: Negative for rash.  Allergic/Immunologic: Negative for immunocompromised state.  Hematological: Negative for adenopathy. Does not bruise/bleed easily.  Psychiatric/Behavioral: Negative for suicidal ideas, sleep disturbance and dysphoric mood. The patient is nervous/anxious.        Objective:   Physical Exam  Constitutional: He appears well-developed and well-nourished. No distress.  Thin, pale, white male   HENT:  Head: Atraumatic.  Mouth/Throat: Oropharynx is clear and moist and mucous membranes are normal. No oral lesions. Abnormal dentition. Dental caries present. No dental abscesses.  Eyes: Conjunctivae are normal. Pupils are equal, round, and reactive to light.  Neck: Normal range of motion. Neck supple.  Cardiovascular: Normal rate, regular rhythm, normal heart sounds and intact distal pulses.   Pulmonary/Chest: Effort normal and breath sounds normal.  Abdominal: Soft. Bowel sounds are normal. He exhibits no distension and no mass. There is no tenderness. There is no rebound and no guarding.  Musculoskeletal: He exhibits no edema.  R 4th finger with fixed flexion at PIP joint and decreased ROM  Neurological: He is alert.  Skin: Skin is warm and dry. No rash noted. No erythema.  Psychiatric: He is agitated.  BP 144/85 mmHg  Pulse 85  Temp(Src) 98.2 F (36.8 C) (Oral)  Resp 16  Ht  (1.702 m)  Wt 137 lb (62.143 kg)  BMI 21.45 kg/m2  SpO2 99% Wt Readings from Last  3 Encounters:  02/26/15 137 lb (62.143 kg)  02/07/15 134 lb 6.4 oz (60.963 kg)  02/05/15 137 lb 6.4 oz (62.324 kg)   Depression screen [facility] 2/9 01/04/2015 03/08/2014 03/02/2014  Decreased Interest 3 0 0  Down, Depressed, Hopeless 3 0 0  PHQ - 2 Score 6 0 0  Altered sleeping 3 - -  Tired, decreased energy 3 - -  Change in appetite 3 - -  Feeling bad or failure about yourself  3 - -  Trouble  concentrating 3 - -  Moving slowly or fidgety/restless 3 - -  Suicidal thoughts 2 - -  PHQ-9 Score 26 - -           Assessment & Plan:

## 2015-02-28 DIAGNOSIS — M65341 Trigger finger, right ring finger: Secondary | ICD-10-CM | POA: Insufficient documentation

## 2015-02-28 DIAGNOSIS — F1290 Cannabis use, unspecified, uncomplicated: Secondary | ICD-10-CM | POA: Insufficient documentation

## 2015-02-28 NOTE — Assessment & Plan Note (Signed)
A; trigger finger due to fracture with malunion P: referral to ortho for evaluation and possible treatment

## 2015-02-28 NOTE — Assessment & Plan Note (Signed)
A: chronic abdominal pain in setting of fecal urgency and intermittent constipation. Suspect IBS P: levsin and PEG ordered

## 2015-02-28 NOTE — Assessment & Plan Note (Signed)
Dental referral placed.

## 2015-03-26 ENCOUNTER — Encounter: Payer: Self-pay | Admitting: Family Medicine

## 2015-03-26 ENCOUNTER — Ambulatory Visit: Payer: Self-pay | Attending: Family Medicine | Admitting: Family Medicine

## 2015-03-26 VITALS — BP 132/82 | HR 84 | Temp 98.2°F | Resp 16 | Ht 67.0 in | Wt 135.0 lb

## 2015-03-26 DIAGNOSIS — R109 Unspecified abdominal pain: Secondary | ICD-10-CM

## 2015-03-26 DIAGNOSIS — G8929 Other chronic pain: Secondary | ICD-10-CM

## 2015-03-26 MED ORDER — METOCLOPRAMIDE HCL 10 MG PO TABS: 10.0000 mg | ORAL_TABLET | Freq: Every day | ORAL | Status: DC | PRN

## 2015-03-26 MED ORDER — ACETAMINOPHEN-CODEINE #3 300-30 MG PO TABS: 1.0000 | ORAL_TABLET | Freq: Three times a day (TID) | ORAL | Status: DC | PRN

## 2015-03-26 MED ORDER — PROMETHAZINE HCL 25 MG PO TABS: 25.0000 mg | ORAL_TABLET | Freq: Three times a day (TID) | ORAL | Status: DC | PRN

## 2015-03-26 NOTE — Progress Notes (Signed)
F/U referral  Pt stated is been depress, refused to fill depression scream form.

## 2015-03-26 NOTE — Progress Notes (Signed)
Subjective:  Patient ID: Levi Palmer, male    DOB: [birth year]  Age: 29 y.o. MRN: [id number]  CC: Follow-up   HPI RENN DIROCCO presents for chronic abdominal pain   1. Abdominal pain: chronic pain in abdomen x 6 years. Pain with sensation of fecal urgency.  Bowels range from diarrhea to constipation. Tends to be more constipated. No blood in stool. Has never had a colonoscopy. He has never been tested for celiac disease that he knows of. He has been treated for H. Pylori in the past. Currently straining to have BMs.  He is requesting refill of phenergan and tylenol #3 with the and addition of reglan.  No fever, chills, emesis. Currently in between GI specialist. Has tried bentyl in past w/o relief.    No ED visit since last OV.  8/22-03/12/15: trial of levsin w/o improvement or change in symptoms.   Outpatient Prescriptions Prior to Visit  Medication Sig Dispense Refill  . acetaminophen-codeine (TYLENOL #3) 300-30 MG per tablet Take 1 tablet by mouth every 8 (eight) hours as needed for moderate pain. 60 tablet 0  . ciprofloxacin (CIPRO) 500 MG tablet Take 1 tablet (500 mg total) by mouth 2 (two) times daily. 20 tablet 0  . doxylamine, Sleep, (UNISOM) 25 MG tablet Take 50 mg by mouth at bedtime as needed for sleep.     . hyoscyamine (LEVSIN) 0.125 MG tablet Take 1 tablet (0.125 mg total) by mouth every 6 (six) hours as needed (abdominal pain, cramping, bloating, gas). 60 tablet 0  . metroNIDAZOLE (FLAGYL) 500 MG tablet Take 1 tablet (500 mg total) by mouth 2 (two) times daily. 20 tablet 0  . omeprazole (PRILOSEC) 20 MG capsule Take 1 capsule (20 mg total) by mouth daily. For 2 weeks, then as needed 30 capsule 0  . polyethylene glycol powder (GLYCOLAX/MIRALAX) powder Take 17 g by mouth daily. 3350 g 1  . promethazine (PHENERGAN) 25 MG tablet Take 1 tablet (25 mg total) by mouth every 8 (eight) hours as needed for nausea or vomiting. 60 tablet 0   No facility-administered  medications prior to visit.   Social History  Substance Use Topics  . Smoking status: Never Smoker   . Smokeless tobacco: Never Used  . Alcohol Use: No   ROS Review of Systems  Constitutional: Negative for fever, chills, fatigue and unexpected weight change.  Eyes: Negative for visual disturbance.  Respiratory: Negative for cough and shortness of breath.   Cardiovascular: Negative for chest pain, palpitations and leg swelling.  Gastrointestinal: Positive for nausea, abdominal pain, diarrhea, constipation and rectal pain. Negative for vomiting, blood in stool and abdominal distention.  Endocrine: Negative for polydipsia, polyphagia and polyuria.  Musculoskeletal: Negative for myalgias, back pain, arthralgias, gait problem and neck pain.       Pain in knees   Skin: Negative for rash.  Allergic/Immunologic: Negative for immunocompromised state.  Hematological: Negative for adenopathy. Does not bruise/bleed easily.  Psychiatric/Behavioral: Negative for suicidal ideas, sleep disturbance and dysphoric mood. The patient is not nervous/anxious.     Objective:  BP 132/82 mmHg  Pulse 84  Temp(Src) 98.2 F (36.8 C) (Oral)  Resp 16  Ht  (1.702 m)  Wt 135 lb (61.236 kg)  BMI 21.14 kg/m2  SpO2 100%  BP/Weight 03/26/2015 02/26/2015 02/21/2015  Systolic BP 132 144 117  Diastolic BP 82 85 74  Wt. (Lbs) 135 137 -  BMI 21.14 21.45 -    Physical Exam  Constitutional: He appears well-developed  and well-nourished. No distress.  Thin, pale, white male   HENT:  Head: Normocephalic and atraumatic.  Mouth/Throat: Oropharynx is clear and moist and mucous membranes are normal. No oral lesions. Abnormal dentition. Dental caries present. No dental abscesses.  Eyes: Conjunctivae are normal. Pupils are equal, round, and reactive to light.  Neck: Normal range of motion. Neck supple.  Cardiovascular: Normal rate, regular rhythm, normal heart sounds and intact distal pulses.   Pulmonary/Chest:  Effort normal and breath sounds normal.  Abdominal: Soft. Bowel sounds are normal. He exhibits no distension and no mass. There is no tenderness. There is no rebound and no guarding.  Musculoskeletal: He exhibits no edema.  Neurological: He is alert.  Skin: Skin is warm and dry. No rash noted. No erythema.  Psychiatric: His speech is normal.  Irritable mood      Assessment & Plan:   Problem List Items Addressed This Visit    Chronic abdominal pain - Primary (Chronic)    A: chronic epigastric pain with nausea, vomiting, intermittent diarrhea/trouble passing stools. I suspect IBS P: Keep f/u appt with GI levisin removed from med list as 2 week trial did not improve symptoms       Relevant Medications   acetaminophen-codeine (TYLENOL #3) 300-30 MG per tablet   promethazine (PHENERGAN) 25 MG tablet   metoCLOPramide (REGLAN) 10 MG tablet   Other Relevant Orders   Gliadin Deamidated Pept Ab,IgA      No orders of the defined types were placed in this encounter.    Follow-up: No Follow-up on file.   Dessa Phi MD

## 2015-03-26 NOTE — Assessment & Plan Note (Addendum)
A: chronic epigastric pain with nausea, vomiting, intermittent diarrhea/trouble passing stools. I suspect IBS. Will need to rule out celiac disease. Patient is very frustrated that he has not yet had a colonoscopy.  P: Keep f/u appt with GI levisin removed from med list as 2 week trial did not improve symptoms  Test of celiac disease with TTG-IgA or HLA DQ 2 and 8  prefered but these are are not available through [facility]   Anti gliadin antibody ordered instead

## 2015-03-26 NOTE — Patient Instructions (Addendum)
Mr. Gehrig, [place] was seen today for follow-up.  Diagnoses and all orders for this visit:  Chronic abdominal pain -     acetaminophen-codeine (TYLENOL #3) 300-30 MG per tablet; Take 1 tablet by mouth every 8 (eight) hours as needed for moderate pain. -     promethazine (PHENERGAN) 25 MG tablet; Take 1 tablet (25 mg total) by mouth every 8 (eight) hours as needed for nausea or vomiting. -     metoCLOPramide (REGLAN) 10 MG tablet; Take 1 tablet (10 mg total) by mouth daily as needed for nausea. -     Gliadin IgA+tTG IgA   Your symptoms sound like IBS. Checking antibodies to check for sprue/celiac disease. See info below.   F/u with me in 6 weeks for abdominal pain   Dr. Armen Pickup

## 2015-03-27 LAB — GLIADIN DEAMIDATED PEPT AB,IGA: Gliadin IgA: 5 Units (ref <20)

## 2015-03-30 ENCOUNTER — Telehealth: Payer: Self-pay | Admitting: Family Medicine

## 2015-03-30 DIAGNOSIS — K0889 Other specified disorders of teeth and supporting structures: Secondary | ICD-10-CM

## 2015-03-30 NOTE — Telephone Encounter (Signed)
Patient called to request another referral to the dentist because he no showed the first appointment to the dentist. Please f/u with pt.

## 2015-04-01 ENCOUNTER — Emergency Department (HOSPITAL_COMMUNITY)
Admission: EM | Admit: 2015-04-01 | Discharge: 2015-04-01 | Disposition: A | Discharge status: Home / Self Care | Payer: Self-pay | Attending: Emergency Medicine | Admitting: Emergency Medicine

## 2015-04-01 ENCOUNTER — Encounter (HOSPITAL_COMMUNITY): Payer: Self-pay | Admitting: Nurse Practitioner

## 2015-04-01 DIAGNOSIS — Z79899 Other long term (current) drug therapy: Secondary | ICD-10-CM | POA: Insufficient documentation

## 2015-04-01 DIAGNOSIS — Z8659 Personal history of other mental and behavioral disorders: Secondary | ICD-10-CM | POA: Insufficient documentation

## 2015-04-01 DIAGNOSIS — K029 Dental caries, unspecified: Secondary | ICD-10-CM | POA: Insufficient documentation

## 2015-04-01 DIAGNOSIS — Z862 Personal history of diseases of the blood and blood-forming organs and certain disorders involving the immune mechanism: Secondary | ICD-10-CM | POA: Insufficient documentation

## 2015-04-01 DIAGNOSIS — K088 Other specified disorders of teeth and supporting structures: Secondary | ICD-10-CM | POA: Insufficient documentation

## 2015-04-01 DIAGNOSIS — Z8719 Personal history of other diseases of the digestive system: Secondary | ICD-10-CM | POA: Insufficient documentation

## 2015-04-01 MED ORDER — PENICILLIN V POTASSIUM 500 MG PO TABS: 500.0000 mg | ORAL_TABLET | Freq: Three times a day (TID) | ORAL | Status: DC

## 2015-04-01 MED ORDER — IBUPROFEN 800 MG PO TABS: 800.0000 mg | ORAL_TABLET | Freq: Three times a day (TID) | ORAL | Status: DC

## 2015-04-01 MED ORDER — IBUPROFEN 400 MG PO TABS
800.0000 mg | ORAL_TABLET | Freq: Once | ORAL | Status: AC
  Administered 2015-04-01: 800 mg via ORAL
  Filled 2015-04-01: qty 2

## 2015-04-01 NOTE — ED Notes (Signed)
He c/o r lower dental pain onset yestserday , increasingly worse since onset and now feels like his gums are swollen. He does not have a dentist. He trie ibuprofen and tylenol 3 with minimal relief

## 2015-04-01 NOTE — Discharge Instructions (Signed)

## 2015-04-01 NOTE — ED Provider Notes (Signed)
CSN: [id number]     Arrival date & time 04/01/15  1209 History   First MD Initiated Contact with Patient 04/01/15 1259     Chief Complaint  Patient presents with  . Dental Pain     (Consider location/radiation/quality/duration/timing/severity/associated sxs/prior Treatment) HPI   29 year old male presents for evaluation of dental pain. Patient report right lower dental pain since yesterday. Pain is achy and throbbing worsening with chewing and temperature changes. He noticed that his gums and swelling. He has tried ibuprofen and Tylenol 3 with minimal relief. Patient denies any hearing changes, runny nose, sore throat, neck pain, or rash. He denies any trauma. Patient is a smoker.  Past Medical History  Diagnosis Date  . Medical history non-contributory   . Anxiety   . Panic attack as reaction to stress   . Anemia   . Depression   . Gallstones    Past Surgical History  Procedure Laterality Date  . Cholecystectomy    . Tonsillectomy    . Incise and drain abcess    . Mouth surgery     Family History  Problem Relation Age of Onset  . Irritable bowel syndrome Mother   . Ulcerative colitis Maternal Grandfather   . Colon cancer Neg Hx   . Colon polyps Neg Hx   . Kidney disease Mother   . Gallbladder disease Neg Hx   . Esophageal cancer Neg Hx   . Diabetes Neg Hx    Social History  Substance Use Topics  . Smoking status: Never Smoker   . Smokeless tobacco: Never Used  . Alcohol Use: No    Review of Systems  Constitutional: Negative for fever.  HENT: Positive for dental problem.   Skin: Negative for wound.      Allergies  Tramadol and Zoloft  Home Medications   Prior to Admission medications   Medication Sig Start Date End Date Taking? Authorizing Provider  acetaminophen-codeine (TYLENOL #3) 300-30 MG per tablet Take 1 tablet by mouth every 8 (eight) hours as needed for moderate pain. 03/26/15   Dessa Phi, MD  doxylamine, Sleep, (UNISOM) 25 MG tablet Take  50 mg by mouth at bedtime as needed for sleep.     Historical Provider, MD  metoCLOPramide (REGLAN) 10 MG tablet Take 1 tablet (10 mg total) by mouth daily as needed for nausea. 03/26/15   Josalyn Funches, MD  omeprazole (PRILOSEC) 20 MG capsule Take 1 capsule (20 mg total) by mouth daily. For 2 weeks, then as needed 02/21/15   Charm Rings, MD  polyethylene glycol powder (GLYCOLAX/MIRALAX) powder Take 17 g by mouth daily. 02/26/15   Dessa Phi, MD  promethazine (PHENERGAN) 25 MG tablet Take 1 tablet (25 mg total) by mouth every 8 (eight) hours as needed for nausea or vomiting. 03/26/15   Josalyn Funches, MD   BP 122/83 mmHg  Pulse 93  Temp(Src) 98.6 F (37 C) (Oral)  Resp 16  Ht  (1.702 m)  Wt 141 lb 9.6 oz (64.229 kg)  BMI 22.17 kg/m2  SpO2 99% Physical Exam  Constitutional: He appears well-developed and well-nourished. No distress.  HENT:  Head: Atraumatic.  Poor dentition with multiple dental decays. Tenderness to tooth #29 and 30 on palpation with moderate dental decay. No gingival erythema or abscess noted. No trismus.  Eyes: Conjunctivae are normal.  Neck: Neck supple.  Neurological: He is alert.  Skin: No rash noted.  Psychiatric: He has a normal mood and affect.  Nursing note and vitals reviewed.  ED Course  Procedures (including critical care time) Labs Review   MDM   Final diagnoses:  Pain due to dental caries    BP 122/83 mmHg  Pulse 93  Temp(Src) 98.6 F (37 C) (Oral)  Resp 16  Ht  (1.702 m)  Wt 141 lb 9.6 oz (64.229 kg)  BMI 22.17 kg/m2  SpO2 99%     Fayrene Helper, PA-C 04/01/15 1306  Alvira Monday, MD 04/03/15 1119

## 2015-04-04 ENCOUNTER — Encounter (HOSPITAL_COMMUNITY): Payer: Self-pay | Admitting: *Deleted

## 2015-04-04 ENCOUNTER — Emergency Department (HOSPITAL_COMMUNITY)
Admission: EM | Admit: 2015-04-04 | Discharge: 2015-04-04 | Disposition: A | Discharge status: Home / Self Care | Payer: Self-pay | Attending: Emergency Medicine | Admitting: Emergency Medicine

## 2015-04-04 DIAGNOSIS — M62838 Other muscle spasm: Secondary | ICD-10-CM | POA: Insufficient documentation

## 2015-04-04 DIAGNOSIS — Z862 Personal history of diseases of the blood and blood-forming organs and certain disorders involving the immune mechanism: Secondary | ICD-10-CM | POA: Insufficient documentation

## 2015-04-04 DIAGNOSIS — Z79899 Other long term (current) drug therapy: Secondary | ICD-10-CM | POA: Insufficient documentation

## 2015-04-04 DIAGNOSIS — M542 Cervicalgia: Secondary | ICD-10-CM | POA: Insufficient documentation

## 2015-04-04 DIAGNOSIS — K002 Abnormalities of size and form of teeth: Secondary | ICD-10-CM | POA: Insufficient documentation

## 2015-04-04 DIAGNOSIS — K0889 Other specified disorders of teeth and supporting structures: Secondary | ICD-10-CM

## 2015-04-04 DIAGNOSIS — K029 Dental caries, unspecified: Secondary | ICD-10-CM | POA: Insufficient documentation

## 2015-04-04 DIAGNOSIS — K088 Other specified disorders of teeth and supporting structures: Secondary | ICD-10-CM | POA: Insufficient documentation

## 2015-04-04 DIAGNOSIS — Z8659 Personal history of other mental and behavioral disorders: Secondary | ICD-10-CM | POA: Insufficient documentation

## 2015-04-04 MED ORDER — METHOCARBAMOL 500 MG PO TABS: 500.0000 mg | ORAL_TABLET | Freq: Two times a day (BID) | ORAL | Status: DC

## 2015-04-04 MED ORDER — METHOCARBAMOL 500 MG PO TABS
500.0000 mg | ORAL_TABLET | Freq: Once | ORAL | Status: AC
  Administered 2015-04-04: 500 mg via ORAL
  Filled 2015-04-04: qty 1

## 2015-04-04 NOTE — ED Provider Notes (Signed)
CSN: [id number]     Arrival date & time 04/04/15  4098 History   None    Chief Complaint  Patient presents with  . Dental Problem  . Neck Pain     (Consider location/radiation/quality/duration/timing/severity/associated sxs/prior Treatment) The history is provided by the patient, medical records and a significant other. No language interpreter was used.     Levi Palmer is a 29 y.o. male  with a hx of anxiety, panic attacks, depression presents to the Emergency Department complaining of gradual, persistent, progressively worsening neck pain  Onset yesterday morning.  Pt reports he awoke with the pain.  He reports the pain is right sided and radiates down his right arm.  Pt reports he has taken ibuprofen and tylenol #3 without relief.  His last dose was 1.5 hours ago.  Pt reports he saw Dr. Ophelia Charter yesterday for the pain and "nothing was done."   Movement and palpation make the pain worse.  Pt denies new activities, lifting pulling, falls, etc.    Pt also c/o abscess to the right lower side after tooth extraction.  Pt was seen for this on 04/01/15 and began the antibiotic yesterday afternoon.  He reports no improvement.  No fever, chills, headache, nausea, vomiting.  Pt reports the teeth were pulled by Dr. Leanord Asal; he has not attempted to call the dentist.      Past Medical History  Diagnosis Date  . Medical history non-contributory   . Anxiety   . Panic attack as reaction to stress   . Anemia   . Depression   . Gallstones    Past Surgical History  Procedure Laterality Date  . Cholecystectomy    . Tonsillectomy    . Incise and drain abcess    . Mouth surgery     Family History  Problem Relation Age of Onset  . Irritable bowel syndrome Mother   . Ulcerative colitis Maternal Grandfather   . Colon cancer Neg Hx   . Colon polyps Neg Hx   . Kidney disease Mother   . Gallbladder disease Neg Hx   . Esophageal cancer Neg Hx   . Diabetes Neg Hx    Social History  Substance  Use Topics  . Smoking status: Never Smoker   . Smokeless tobacco: Never Used  . Alcohol Use: No    Review of Systems  Constitutional: Negative for fever, chills, appetite change and fatigue.  HENT: Positive for dental problem. Negative for drooling, ear pain, facial swelling, nosebleeds, postnasal drip, rhinorrhea and trouble swallowing.   Eyes: Negative for pain and redness.  Respiratory: Negative for cough, chest tightness, shortness of breath and wheezing.   Cardiovascular: Negative for chest pain.  Gastrointestinal: Negative for nausea, vomiting, abdominal pain and diarrhea.  Genitourinary: Negative for dysuria, urgency, frequency and hematuria.  Musculoskeletal: Positive for neck pain. Negative for back pain, joint swelling, gait problem and neck stiffness.  Skin: Negative for color change and rash.  Neurological: Negative for weakness, light-headedness, numbness and headaches.  All other systems reviewed and are negative.     Allergies  Tramadol and Zoloft  Home Medications   Prior to Admission medications   Medication Sig Start Date End Date Taking? Authorizing Provider  acetaminophen-codeine (TYLENOL #3) 300-30 MG per tablet Take 1 tablet by mouth every 8 (eight) hours as needed for moderate pain. 03/26/15   Dessa Phi, MD  doxylamine, Sleep, (UNISOM) 25 MG tablet Take 50 mg by mouth at bedtime as needed for sleep.  Historical Provider, MD  ibuprofen (ADVIL,MOTRIN) 800 MG tablet Take 1 tablet (800 mg total) by mouth 3 (three) times daily. 04/01/15   Fayrene Helper, PA-C  methocarbamol (ROBAXIN) 500 MG tablet Take 1 tablet (500 mg total) by mouth 2 (two) times daily. 04/04/15   Hannah Muthersbaugh, PA-C  metoCLOPramide (REGLAN) 10 MG tablet Take 1 tablet (10 mg total) by mouth daily as needed for nausea. 03/26/15   Josalyn Funches, MD  omeprazole (PRILOSEC) 20 MG capsule Take 1 capsule (20 mg total) by mouth daily. For 2 weeks, then as needed 02/21/15   Charm Rings, MD   penicillin v potassium (VEETID) 500 MG tablet Take 1 tablet (500 mg total) by mouth 3 (three) times daily. 04/01/15   Fayrene Helper, PA-C  polyethylene glycol powder (GLYCOLAX/MIRALAX) powder Take 17 g by mouth daily. 02/26/15   Dessa Phi, MD  promethazine (PHENERGAN) 25 MG tablet Take 1 tablet (25 mg total) by mouth every 8 (eight) hours as needed for nausea or vomiting. 03/26/15   Josalyn Funches, MD   BP 107/68 mmHg  Pulse 93  Temp(Src) 98 F (36.7 C) (Oral)  Resp 16  Ht  (1.702 m)  Wt 140 lb (63.504 kg)  BMI 21.92 kg/m2  SpO2 100% Physical Exam  Constitutional: He appears well-developed and well-nourished. No distress.  HENT:  Head: Normocephalic and atraumatic.  Right Ear: Tympanic membrane, external ear and ear canal normal.  Left Ear: Tympanic membrane, external ear and ear canal normal.  Nose: Nose normal. Right sinus exhibits no maxillary sinus tenderness and no frontal sinus tenderness. Left sinus exhibits no maxillary sinus tenderness and no frontal sinus tenderness.  Mouth/Throat: Uvula is midline, oropharynx is clear and moist and mucous membranes are normal. No oral lesions. Abnormal dentition. Dental caries present. No uvula swelling or lacerations. No oropharyngeal exudate, posterior oropharyngeal edema, posterior oropharyngeal erythema or tonsillar abscesses.  No gingival swelling, fluctuance or induration No gross abscess # 29 and 30 are surgically absent; stitches in place. No evidence of abscess  Eyes: Conjunctivae are normal. Pupils are equal, round, and reactive to light. Right eye exhibits no discharge. Left eye exhibits no discharge.  Neck: Normal range of motion. Neck supple.  Full ROM with minimal pain Mild TTP of the paraspinal muscles of the right side around C7 No midline tenderness  Cardiovascular: Regular rhythm and intact distal pulses.   Mild tachycardia  Pulmonary/Chest: Effort normal and breath sounds normal. No respiratory distress. He has no  wheezes.  Equal chest rise  Abdominal: Soft. Bowel sounds are normal. He exhibits no distension. There is no tenderness.  Musculoskeletal:  Full range of motion of the T-spine and L-spine No tenderness to palpation of the spinous processes of the T-spine or L-spine No tenderness to palpation of the paraspinous muscles of the T-spine or L-spine FROM of the right shoulder with pain in the trapezius No joint line tenderness or deformity  Lymphadenopathy:    He has no cervical adenopathy.  Neurological: He is alert. He has normal reflexes.  Reflex Scores:      Bicep reflexes are 2+ on the right side and 2+ on the left side.      Brachioradialis reflexes are 2+ on the right side and 2+ on the left side.      Patellar reflexes are 2+ on the right side and 2+ on the left side.      Achilles reflexes are 2+ on the right side and 2+ on the left side. Speech  is clear and goal oriented, follows commands Normal 5/5 strength in upper and lower extremities bilaterally including dorsiflexion and plantar flexion, strong and equal grip strength Sensation normal to light and sharp touch Moves extremities without ataxia, coordination intact Normal gait Normal balance No Clonus   Skin: Skin is warm and dry. No rash noted. He is not diaphoretic. No erythema.  Psychiatric: He has a normal mood and affect. His behavior is normal.  Nursing note and vitals reviewed.   ED Course  Procedures (including critical care time) Labs Review Labs Reviewed - No data to display  Imaging Review No results found. I have personally reviewed and evaluated these images and lab results as part of my medical decision-making.   EKG Interpretation None      MDM   Final diagnoses:  Pain, dental  Neck pain  Muscle spasm   LYNK MARTI presents with multiple problems.  Patient with toothache.  No gross abscess.  Exam unconcerning for Ludwig's angina or spread of infection.  Pt is being treated with  penicillin at this time.  Urged patient to follow-up with dentist.    Patient also paraspinal neck pain.  No neurological deficits and normal neuro exam.  Patient can walk without difficulty and has full ROM.  No loss of bowel or bladder control.  No fever, night sweats, weight loss, h/o cancer, IVDU.  RICE protocol and muscle relaxer indicated and discussed with patient.   BP 107/68 mmHg  Pulse 93  Temp(Src) 98 F (36.7 C) (Oral)  Resp 16  Ht  (1.702 m)  Wt 140 lb (63.504 kg)  BMI 21.92 kg/m2  SpO2 100%    Dierdre Forth, PA-C 04/04/15 1448  Laurence Spates, MD 04/04/15 1547

## 2015-04-04 NOTE — ED Notes (Signed)
Declined W/C at D/C and was escorted to lobby by RN. 

## 2015-04-04 NOTE — ED Notes (Signed)
PT was seen by DR Lorin Mercy on Tuesday and was told his neck hurt because he did not have a job . 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 Pt woke up in the morning with pain to RT side  of neck and needed help getting out of bed. PT also had teeth pulled and feels like he has an abscess at site lower ret side.

## 2015-04-04 NOTE — Discharge Instructions (Signed)
1. Medications: robaxin, ibuprofen, penicillin (prescribed at your last visit), usual home medications 2. Treatment: rest, drink plenty of fluids, gentle stretching as discussed, alternate ice and heat 3. Follow Up: Please followup with your primary doctor in 3 days for discussion of your diagnoses and further evaluation after today's visit; if you do not have a primary care doctor use the resource guide provided to find one;  Return to the ER for worsening back pain, difficulty walking, loss of bowel or bladder control or other concerning symptoms    Torticollis, Acute You have suddenly (acutely) developed a twisted neck (torticollis). This is usually a self-limited condition. CAUSES  Acute torticollis may be caused by malposition, trauma or infection. Most commonly, acute torticollis is caused by sleeping in an awkward position. Torticollis may also be caused by the flexion, extension or twisting of the neck muscles beyond their normal position. Sometimes, the exact cause may not be known. SYMPTOMS  Usually, there is pain and limited movement of the neck. Your neck may twist to one side. DIAGNOSIS  The diagnosis is often made by physical examination. X-rays, CT scans or MRIs may be done if there is a history of trauma or concern of infection. TREATMENT  For a common, stiff neck that develops during sleep, treatment is focused on relaxing the contracted neck muscle. Medications (including shots) may be used to treat the problem. Most cases resolve in several days. Torticollis usually responds to conservative physical therapy. If left untreated, the shortened and spastic neck muscle can cause deformities in the face and neck. Rarely, surgery is required. HOME CARE INSTRUCTIONS   Use over-the-counter and prescription medications as directed by your caregiver.  Do stretching exercises and massage the neck as directed by your caregiver.  Follow up with physical therapy if needed and as directed by  your caregiver. SEEK IMMEDIATE MEDICAL CARE IF:   You develop difficulty breathing or noisy breathing (stridor).  You drool, develop trouble swallowing or have pain with swallowing.  You develop numbness or weakness in the hands or feet.  You have changes in speech or vision.  You have problems with urination or bowel movements.  You have difficulty walking.  You have a fever.  You have increased pain. MAKE SURE YOU:   Understand these instructions.  Will watch your condition.  Will get help right away if you are not doing well or get worse. Document Released: 06/20/2000 Document Revised: 09/15/2011 Document Reviewed: 08/01/2009 [facility] Patient Information 2015 [place], [place]. This information is not intended to replace advice given to you by your health care provider. Make sure you discuss any questions you have with your health care provider.

## 2015-04-11 ENCOUNTER — Ambulatory Visit: Payer: Self-pay | Admitting: Family Medicine

## 2015-04-12 ENCOUNTER — Other Ambulatory Visit (HOSPITAL_COMMUNITY): Payer: Self-pay | Admitting: Orthopaedic Surgery

## 2015-04-12 DIAGNOSIS — M542 Cervicalgia: Secondary | ICD-10-CM

## 2015-04-19 ENCOUNTER — Ambulatory Visit (HOSPITAL_COMMUNITY)
Admission: RE | Admit: 2015-04-19 | Discharge: 2015-04-19 | Disposition: A | Discharge status: Home / Self Care | Payer: Self-pay | Source: Ambulatory Visit | Attending: Orthopaedic Surgery | Admitting: Orthopaedic Surgery

## 2015-04-19 DIAGNOSIS — M50222 Other cervical disc displacement at C5-C6 level: Secondary | ICD-10-CM | POA: Insufficient documentation

## 2015-04-19 DIAGNOSIS — M542 Cervicalgia: Secondary | ICD-10-CM

## 2015-04-19 DIAGNOSIS — M50221 Other cervical disc displacement at C4-C5 level: Secondary | ICD-10-CM | POA: Insufficient documentation

## 2015-04-19 DIAGNOSIS — M50223 Other cervical disc displacement at C6-C7 level: Secondary | ICD-10-CM | POA: Insufficient documentation

## 2015-04-26 ENCOUNTER — Encounter: Payer: Self-pay | Admitting: Family Medicine

## 2015-04-26 DIAGNOSIS — G8929 Other chronic pain: Secondary | ICD-10-CM

## 2015-04-26 DIAGNOSIS — R109 Unspecified abdominal pain: Principal | ICD-10-CM

## 2015-04-26 NOTE — Assessment & Plan Note (Signed)
[facility] On linzess for constipation On librax for abdominal pain Planning colonoscopy and EGD Suspect adhesions as cause of abdominal pain

## 2015-05-04 ENCOUNTER — Ambulatory Visit: Payer: Self-pay | Attending: Family Medicine | Admitting: Family Medicine

## 2015-05-04 ENCOUNTER — Telehealth: Payer: Self-pay | Admitting: Family Medicine

## 2015-05-04 ENCOUNTER — Encounter: Payer: Self-pay | Admitting: Family Medicine

## 2015-05-04 VITALS — BP 126/84 | HR 90 | Temp 99.0°F | Resp 20 | Ht 67.0 in | Wt 144.0 lb

## 2015-05-04 DIAGNOSIS — R109 Unspecified abdominal pain: Secondary | ICD-10-CM | POA: Insufficient documentation

## 2015-05-04 DIAGNOSIS — R1310 Dysphagia, unspecified: Secondary | ICD-10-CM | POA: Insufficient documentation

## 2015-05-04 DIAGNOSIS — F419 Anxiety disorder, unspecified: Secondary | ICD-10-CM | POA: Insufficient documentation

## 2015-05-04 DIAGNOSIS — G8929 Other chronic pain: Secondary | ICD-10-CM | POA: Insufficient documentation

## 2015-05-04 MED ORDER — ACETAMINOPHEN-CODEINE #3 300-30 MG PO TABS: 1.0000 | ORAL_TABLET | Freq: Three times a day (TID) | ORAL | Status: DC | PRN

## 2015-05-04 MED ORDER — SUCRALFATE 1 G PO TABS: 1.0000 g | ORAL_TABLET | Freq: Three times a day (TID) | ORAL | Status: DC

## 2015-05-04 NOTE — Progress Notes (Signed)
Patient ID: Levi Palmer, male   DOB: [birth year], 29 y.o.   MRN: [id number]   Subjective:  Patient ID: Levi Palmer, male    DOB: February 11, 1986  Age: 29 y.o. MRN: [id number]  CC: No chief complaint on file.   HPI BHAVESH VAZQUEZ presents for chronic abdominal pain   1. Abdominal pain: chronic pain in abdomen x 6 years. Improved since being on librax. He has established care with GI, Dr. Ewing Schlein. Requesting tylenol #3 refill.   2. Pain with swallowing: x one week. No GERD. No cough, sneezing, fever or SOB. No blood in emesis. No difficulty swallowing solids or liquids.   3. Anxiety: chronic for many year. Patient is frustrated with treatment at [facility]. Anxiety is triggered by sickness and vomiting. Anxiety is debilitating. Previous treatments with sedating antihistamines have not helped.    Social History  Substance Use Topics  . Smoking status: Never Smoker   . Smokeless tobacco: Never Used  . Alcohol Use: No   Outpatient Prescriptions Prior to Visit  Medication Sig Dispense Refill  . acetaminophen-codeine (TYLENOL #3) 300-30 MG per tablet Take 1 tablet by mouth every 8 (eight) hours as needed for moderate pain. 90 tablet 0  . clidinium-chlordiazePOXIDE (LIBRAX) 5-2.5 MG capsule Take 1 capsule by mouth every 8 (eight) hours as needed. Dr. Ewing Schlein GI    . doxylamine, Sleep, (UNISOM) 25 MG tablet Take 50 mg by mouth at bedtime as needed for sleep.     Marland Kitchen ibuprofen (ADVIL,MOTRIN) 800 MG tablet Take 1 tablet (800 mg total) by mouth 3 (three) times daily. 21 tablet 0  . Linaclotide (LINZESS) 145 MCG CAPS capsule Take 145 mcg by mouth daily. Dr. Ewing Schlein    . metoCLOPramide (REGLAN) 10 MG tablet Take 1 tablet (10 mg total) by mouth daily as needed for nausea. 15 tablet 1  . polyethylene glycol powder (GLYCOLAX/MIRALAX) powder Take 17 g by mouth daily. 3350 g 1  . promethazine (PHENERGAN) 25 MG tablet Take 1 tablet (25 mg total) by mouth every 8 (eight) hours as needed for nausea or  vomiting. 60 tablet 2  . methocarbamol (ROBAXIN) 500 MG tablet Take 1 tablet (500 mg total) by mouth 2 (two) times daily. (Patient not taking: Reported on 05/04/2015) 20 tablet 0  . omeprazole (PRILOSEC) 20 MG capsule Take 1 capsule (20 mg total) by mouth daily. For 2 weeks, then as needed (Patient not taking: Reported on 05/04/2015) 30 capsule 0  . penicillin v potassium (VEETID) 500 MG tablet Take 1 tablet (500 mg total) by mouth 3 (three) times daily. (Patient not taking: Reported on 05/04/2015) 30 tablet 0   No facility-administered medications prior to visit.   Social History  Substance Use Topics  . Smoking status: Never Smoker   . Smokeless tobacco: Never Used  . Alcohol Use: No   ROS Review of Systems  Constitutional: Negative for fever, chills, fatigue and unexpected weight change.  Eyes: Negative for visual disturbance.  Respiratory: Negative for cough and shortness of breath.   Cardiovascular: Negative for chest pain, palpitations and leg swelling.  Gastrointestinal: Positive for nausea, abdominal pain, diarrhea, constipation and rectal pain. Negative for vomiting, blood in stool and abdominal distention.  Endocrine: Negative for polydipsia, polyphagia and polyuria.  Musculoskeletal: Negative for myalgias, back pain, arthralgias, gait problem and neck pain.       Pain in knees   Skin: Negative for rash.  Allergic/Immunologic: Negative for immunocompromised state.  Hematological: Negative for adenopathy. Does not bruise/bleed easily.  Psychiatric/Behavioral: Negative for suicidal ideas, sleep disturbance and dysphoric mood. The patient is nervous/anxious.     Objective:  BP 126/84 mmHg  Pulse 90  Temp(Src) 99 F (37.2 C) (Oral)  Resp 20  Ht 5\' 7"  (1.702 m)  Wt 144 lb (65.318 kg)  BMI 22.55 kg/m2  SpO2 100%  BP/Weight 05/04/2015 04/04/2015 04/01/2015  Systolic BP 126 107 125  Diastolic BP 84 68 77  Wt. (Lbs) 144 140 141.6  BMI 22.55 21.92 22.17    Physical Exam    Constitutional: He appears well-developed and well-nourished. No distress.  Thin, pale, white male   HENT:  Head: Normocephalic and atraumatic.  Mouth/Throat: Oropharynx is clear and moist and mucous membranes are normal. No oral lesions. Abnormal dentition. Dental caries present. No dental abscesses.  Eyes: Conjunctivae are normal. Pupils are equal, round, and reactive to light.  Neck: Normal range of motion. Neck supple.  Cardiovascular: Normal rate, regular rhythm, normal heart sounds and intact distal pulses.   Pulmonary/Chest: Effort normal and breath sounds normal.  Abdominal: Soft. Bowel sounds are normal. He exhibits no distension and no mass. There is no tenderness. There is no rebound and no guarding.  Musculoskeletal: He exhibits no edema.  Neurological: He is alert.  Skin: Skin is warm and dry. No rash noted. No erythema.  Psychiatric: His speech is normal. His mood appears anxious. He exhibits a depressed mood.  Irritable mood      Assessment & Plan:   Problem List Items Addressed This Visit    Anxiety (Chronic)   Chronic abdominal pain - Primary (Chronic)   Relevant Medications   acetaminophen-codeine (TYLENOL #3) 300-30 MG tablet    Other Visit Diagnoses    Painful swallowing        Relevant Medications    sucralfate (CARAFATE) 1 G tablet       No orders of the defined types were placed in this encounter.    Follow-up: No Follow-up on file.   Dessa PhiJosalyn Heavin Sebree MD

## 2015-05-04 NOTE — Telephone Encounter (Signed)
Patient was seen today.Patient wanted to make another appointment to be seen. Patient states that he is experiencing high anxiety and needs treatment. Please follow up with patient

## 2015-05-04 NOTE — Progress Notes (Signed)
Patient complains of abdominal pain scaled at a 7, described as a constant cramping, pain has been present for years.  Patient states he does go to a gastroenterologist. Patient states when moving his bowels he becomes sick on his stomach and has to vomit.  Patient states [facility] becoming [facility]". Patient displeased with medications being offered at facility.  Patient requesting refill on Tylenol 3,   Patient states ortho confirmed slipping desk in neck. Patient unable to have surgery due to gastro issues.

## 2015-05-04 NOTE — Patient Instructions (Addendum)
Diagnoses and all orders for this visit:  Chronic abdominal pain -     acetaminophen-codeine (TYLENOL #3) 300-30 MG tablet; Take 1 tablet by mouth every 8 (eight) hours as needed for moderate pain.  Painful swallowing -     sucralfate (CARAFATE) 1 G tablet; Take 1 tablet (1 g total) by mouth 4 (four) times daily -  with meals and at bedtime.  Anxiety  [facility]   F/u in 8 weeks for painful swallowing, f/u sooner if symptoms worsen   Dr. Armen PickupFunches

## 2015-05-04 NOTE — Assessment & Plan Note (Signed)
[facility]

## 2015-05-10 ENCOUNTER — Encounter (HOSPITAL_COMMUNITY): Payer: Self-pay | Admitting: Emergency Medicine

## 2015-05-10 ENCOUNTER — Emergency Department (HOSPITAL_COMMUNITY): Payer: Self-pay

## 2015-05-10 ENCOUNTER — Emergency Department (HOSPITAL_COMMUNITY)
Admission: EM | Admit: 2015-05-10 | Discharge: 2015-05-10 | Disposition: A | Discharge status: Home / Self Care | Payer: Self-pay | Attending: Emergency Medicine | Admitting: Emergency Medicine

## 2015-05-10 DIAGNOSIS — Z862 Personal history of diseases of the blood and blood-forming organs and certain disorders involving the immune mechanism: Secondary | ICD-10-CM | POA: Insufficient documentation

## 2015-05-10 DIAGNOSIS — R1310 Dysphagia, unspecified: Secondary | ICD-10-CM

## 2015-05-10 DIAGNOSIS — Z8659 Personal history of other mental and behavioral disorders: Secondary | ICD-10-CM | POA: Insufficient documentation

## 2015-05-10 DIAGNOSIS — R109 Unspecified abdominal pain: Secondary | ICD-10-CM

## 2015-05-10 DIAGNOSIS — K209 Esophagitis, unspecified without bleeding: Secondary | ICD-10-CM

## 2015-05-10 DIAGNOSIS — Z79899 Other long term (current) drug therapy: Secondary | ICD-10-CM | POA: Insufficient documentation

## 2015-05-10 DIAGNOSIS — G8929 Other chronic pain: Secondary | ICD-10-CM

## 2015-05-10 LAB — CBC
HEMATOCRIT: 38 % — AB (ref 39.0–52.0)
Hemoglobin: 12.2 g/dL — ABNORMAL LOW (ref 13.0–17.0)
MCH: 24.8 pg — AB (ref 26.0–34.0)
MCHC: 32.1 g/dL (ref 30.0–36.0)
MCV: 77.2 fL — AB (ref 78.0–100.0)
Platelets: 272 10*3/uL (ref 150–400)
RBC: 4.92 MIL/uL (ref 4.22–5.81)
RDW: 17.6 % — AB (ref 11.5–15.5)
WBC: 13.1 10*3/uL — ABNORMAL HIGH (ref 4.0–10.5)

## 2015-05-10 LAB — BASIC METABOLIC PANEL
Anion gap: 9 (ref 5–15)
BUN: 16 mg/dL (ref 6–20)
CO2: 25 mmol/L (ref 22–32)
Calcium: 9.8 mg/dL (ref 8.9–10.3)
Chloride: 104 mmol/L (ref 101–111)
Creatinine, Ser: 0.9 mg/dL (ref 0.61–1.24)
GFR calc Af Amer: >60 mL/min (ref >60)
GFR calc non Af Amer: >60 mL/min (ref >60)
GLUCOSE: 107 mg/dL — AB (ref 65–99)
POTASSIUM: 3.6 mmol/L (ref 3.5–5.1)
Sodium: 138 mmol/L (ref 135–145)

## 2015-05-10 LAB — I-STAT TROPONIN, ED: Troponin i, poc: 0 ng/mL (ref 0.00–0.08)

## 2015-05-10 MED ORDER — GI COCKTAIL ~~LOC~~
30.0000 mL | Freq: Once | ORAL | Status: AC
  Administered 2015-05-10: 30 mL via ORAL
  Filled 2015-05-10: qty 30

## 2015-05-10 MED ORDER — OMEPRAZOLE 20 MG PO CPDR: 20.0000 mg | DELAYED_RELEASE_CAPSULE | Freq: Every day | ORAL | Status: DC

## 2015-05-10 MED ORDER — METOCLOPRAMIDE HCL 10 MG PO TABS: 10.0000 mg | ORAL_TABLET | Freq: Every day | ORAL | Status: DC | PRN

## 2015-05-10 MED ORDER — SUCRALFATE 1 GM/10ML PO SUSP
1.0000 g | Freq: Three times a day (TID) | ORAL | Status: DC
  Filled 2015-05-10 (×4): qty 10

## 2015-05-10 MED ORDER — SUCRALFATE 1 G PO TABS: 1.0000 g | ORAL_TABLET | Freq: Three times a day (TID) | ORAL | Status: DC

## 2015-05-10 NOTE — ED Provider Notes (Signed)
CSN: [id number]     Arrival date & time 05/10/15  0930 History   First MD Initiated Contact with Patient 05/10/15 [phone]     Chief Complaint  Patient presents with  . Chest Pain      HPI Patient presents complaining of chest pain and difficulty and painful swallowing.  He states his had long-standing history of upper abdominal discomfort including requiring prior endoscopies.  He's never been told he has esophagitis or gastritis.  He is currently not on any PPI.  He states he has painful swallowing and reports a sensation of something abnormal in his throat.  He has not tried any medications prior to arrival.  He denies nausea vomiting.  No diarrhea.  No fevers or chills.  No radiation of his anterior chest pain.  He reports his pain in his chest is really only present with swallowing.  No other complaints   Past Medical History  Diagnosis Date  . Medical history non-contributory   . Anxiety   . Panic attack as reaction to stress   . Anemia   . Depression   . Gallstones    Past Surgical History  Procedure Laterality Date  . Cholecystectomy    . Tonsillectomy    . Incise and drain abcess    . Mouth surgery     Family History  Problem Relation Age of Onset  . Irritable bowel syndrome Mother   . Ulcerative colitis Maternal Grandfather   . Colon cancer Neg Hx   . Colon polyps Neg Hx   . Kidney disease Mother   . Gallbladder disease Neg Hx   . Esophageal cancer Neg Hx   . Diabetes Neg Hx    Social History  Substance Use Topics  . Smoking status: Never Smoker   . Smokeless tobacco: Never Used  . Alcohol Use: No    Review of Systems  All other systems reviewed and are negative.     Allergies  Tramadol and Zoloft  Home Medications   Prior to Admission medications   Medication Sig Start Date End Date Taking? Authorizing Provider  acetaminophen-codeine (TYLENOL #3) 300-30 MG tablet Take 1 tablet by mouth every 8 (eight) hours as needed for moderate pain. Patient  taking differently: Take 1 tablet by mouth every 4 (four) hours as needed for moderate pain.  05/04/15  Yes Josalyn Funches, MD  clidinium-chlordiazePOXIDE (LIBRAX) 5-2.5 MG capsule Take 1 capsule by mouth daily. Dr. Ewing Schlein GI   Yes Historical Provider, MD  diphenhydrAMINE (BENADRYL) 25 mg capsule Take 50 mg by mouth at bedtime as needed for sleep.   Yes Historical Provider, MD  Linaclotide Karlene Einstein) 145 MCG CAPS capsule Take 145 mcg by mouth daily. Dr. Ewing Schlein   Yes Historical Provider, MD  PRESCRIPTION MEDICATION Cortizone injection in neck at dr's office   Yes Historical Provider, MD  promethazine (PHENERGAN) 25 MG tablet Take 1 tablet (25 mg total) by mouth every 8 (eight) hours as needed for nausea or vomiting. 03/26/15  Yes Dessa Phi, MD  metoCLOPramide (REGLAN) 10 MG tablet Take 1 tablet (10 mg total) by mouth daily as needed for nausea. 05/10/15   Azalia Bilis, MD  omeprazole (PRILOSEC) 20 MG capsule Take 1 capsule (20 mg total) by mouth daily. For 2 weeks, then as needed 05/10/15   Azalia Bilis, MD  polyethylene glycol powder (GLYCOLAX/MIRALAX) powder Take 17 g by mouth daily. Patient not taking: Reported on 05/10/2015 02/26/15   Dessa Phi, MD  sucralfate (CARAFATE) 1 G tablet Take 1  tablet (1 g total) by mouth 4 (four) times daily -  with meals and at bedtime. 05/10/15   Azalia BilisKevin Burrell Hodapp, MD   BP 126/76 mmHg  Pulse 72  Temp(Src) 97.5 F (36.4 C) (Oral)  Resp 21  SpO2 100% Physical Exam  Constitutional: He is oriented to person, place, and time. He appears well-developed and well-nourished.  HENT:  Head: Normocephalic and atraumatic.  Eyes: EOM are normal.  Neck: Normal range of motion.  Cardiovascular: Normal rate, regular rhythm, normal heart sounds and intact distal pulses.   Pulmonary/Chest: Effort normal and breath sounds normal. No respiratory distress.  Abdominal: Soft. He exhibits no distension. There is no tenderness.  Musculoskeletal: Normal range of motion.   Neurological: He is alert and oriented to person, place, and time.  Skin: Skin is warm and dry.  Psychiatric: He has a normal mood and affect. Judgment normal.  Nursing note and vitals reviewed.   ED Course  Procedures (including critical care time) Labs Review Labs Reviewed  BASIC METABOLIC PANEL - Abnormal; Notable for the following:    Glucose, Bld 107 (*)    All other components within normal limits  CBC - Abnormal; Notable for the following:    WBC 13.1 (*)    Hemoglobin 12.2 (*)    HCT 38.0 (*)    MCV 77.2 (*)    MCH 24.8 (*)    RDW 17.6 (*)    All other components within normal limits  I-STAT TROPOININ, ED    Imaging Review Dg Chest 2 View  05/10/2015  CLINICAL DATA:  Chest pain for 1 week EXAM: CHEST  2 VIEW COMPARISON:  February 07, 2015 FINDINGS: Lungs are clear. The heart size and pulmonary vascularity are normal. No adenopathy. No pneumothorax. No bone lesions. IMPRESSION: No edema or consolidation. Electronically Signed   By: Bretta BangWilliam  Woodruff III M.D.   On: 05/10/2015 10:11   I have personally reviewed and evaluated these images and lab results as part of my medical decision-making.   EKG Interpretation   Date/Time:  Thursday May 10 2015 09:44:51 EDT Ventricular Rate:  95 PR Interval:  128 QRS Duration: 95 QT Interval:  334 QTC Calculation: 420 R Axis:   66 Text Interpretation:  Sinus rhythm RSR' in V1 or V2, right VCD or RVH No  significant change was found Confirmed by Yaman Grauberger  MD, Caryn BeeKEVIN ([id number]) on  05/10/2015 10:33:50 AM      MDM   Final diagnoses:  Esophagitis    Chest x-ray and lab workup without abnormalities.  Patient is beginning to show some improvement with GI cocktail.  He'll be given a dose of Carafate as well.  He is post be on Carafate and PPI at home.  He is not taking these.  He will need to follow back up with his primary care physician and likely require repeat upper endoscopy.  He may have esophagitis.  This will need to be better  delineated by his gastroenterologist.  No indication for additional workup in the emergency department.  Nontoxic-appearing.  Patient understands to return to the ER for new or worsening symptoms    Azalia BilisKevin Gemma Ruan, MD 05/10/15 1221

## 2015-05-10 NOTE — ED Notes (Signed)
Patient transported to X-ray 

## 2015-05-10 NOTE — ED Notes (Signed)
EKG given to Dr. Pfeiffer 

## 2015-05-10 NOTE — Telephone Encounter (Signed)
I spoke to Dr. Marlane HatcherMagod's nurse Britta MccreedyBarbara  I was informed that patient is not taking librax as he could not afford it He is taking donnatal instead  I asked if adding a BZ to help manage anxiety would complicate the w/u.  Britta MccreedyBarbara informed me that she did not believe Dr. Ewing SchleinMagod would disagree with adding BZ  I spoke to my medical director who agreed with plan for BZ He recommend klonopin Plan to add klonopin 0.5 mg BID with goal of improving overall anxiety and abdominal pain  Attempted to call patient to discuss plan Fist # no answer  Emergency contact, Left Vm requesting a call back   I will wait to order klonopin until I have a chance to speak with the patient.

## 2015-05-10 NOTE — ED Notes (Signed)
Patient with hx of "air leak" somewhere in his chest for which he has been hospitalized c/o medial chest pain/pressure onset last Thursday after starting taking Donnatol which caused difficulty swallowing. Pt in control of oral secretions at this time. Pt asked if he had ever been diagnosed with a pneumomediastinum, pt states he is unsure but that he believes that the doctors may have used that word.

## 2015-05-10 NOTE — ED Notes (Signed)
Pt leaves ED upset. States that we did not give him anything that helped and he would rather suffer at home.  Complained that pulling the tape off of his IV hurt. No redness or injury noted.

## 2015-05-10 NOTE — Telephone Encounter (Signed)
Patient has OV for tomorrow Called patient

## 2015-05-10 NOTE — ED Notes (Signed)
Pt mentioned hx of air leaking in triage. No crepitus felt upon palpation.

## 2015-05-11 ENCOUNTER — Encounter: Payer: Self-pay | Admitting: Family Medicine

## 2015-05-11 ENCOUNTER — Ambulatory Visit: Payer: Self-pay | Attending: Family Medicine | Admitting: Family Medicine

## 2015-05-11 VITALS — BP 116/76 | HR 100 | Temp 98.5°F | Resp 16 | Ht 67.0 in | Wt 140.0 lb

## 2015-05-11 DIAGNOSIS — R1310 Dysphagia, unspecified: Secondary | ICD-10-CM | POA: Insufficient documentation

## 2015-05-11 DIAGNOSIS — F419 Anxiety disorder, unspecified: Secondary | ICD-10-CM | POA: Insufficient documentation

## 2015-05-11 MED ORDER — SUCRALFATE 1 G PO TABS: 1.0000 g | ORAL_TABLET | Freq: Three times a day (TID) | ORAL | Status: DC

## 2015-05-11 MED ORDER — CLONAZEPAM 0.5 MG PO TABS: 0.5000 mg | ORAL_TABLET | Freq: Two times a day (BID) | ORAL | Status: DC

## 2015-05-11 NOTE — Patient Instructions (Signed)
Casimiro NeedleMichael was seen today for anxiety and neck pain.  Diagnoses and all orders for this visit:  Anxiety -     clonazePAM (KLONOPIN) 0.5 MG tablet; Take 1 tablet (0.5 mg total) by mouth 2 (two) times daily.  Painful swallowing -     sucralfate (CARAFATE) 1 G tablet; Take 1 tablet (1 g total) by mouth 4 (four) times daily -  with meals and at bedtime. Dissolve tablet in water  Odynophagia -     sucralfate (CARAFATE) 1 G tablet; Take 1 tablet (1 g total) by mouth 4 (four) times daily -  with meals and at bedtime. Dissolve tablet in water    Casimiro NeedleMichael,  Thank you for coming in today. Starting klopin today with goal of improving anxiety and pain symptoms.  Checked with pharmacy, no carafate solution. They recommended dissolving tablets in water.  F/u in 3 weeks for anxiety/check if klonopin has helped  Dr. Armen PickupFunches

## 2015-05-11 NOTE — Progress Notes (Signed)
F/U anxiety, Throat problem  C/C neck pain  Pain scale #10

## 2015-05-11 NOTE — Progress Notes (Signed)
Patient ID: Levi Palmer, male   DOB: 1985-11-28, 28 y.o.   MRN: [id number]   Subjective:  Patient ID: Levi Palmer, male    DOB: [birth year]  Age: 29 y.o. MRN: [id number]  CC: Anxiety and Neck Pain   HPI Levi Palmer presents for   1. Anxiety: chronic. Persistent. Multiple pain complaints. Patient has decided he will not return to [facility].  2. Pain with swallowing: slight improvement with gi cocktail in ED. No fever or chills. No change in weight could not swallow carafate tablets.   Outpatient Prescriptions Prior to Visit  Medication Sig Dispense Refill  . acetaminophen-codeine (TYLENOL #3) 300-30 MG tablet Take 1 tablet by mouth every 8 (eight) hours as needed for moderate pain. (Patient taking differently: Take 1 tablet by mouth every 4 (four) hours as needed for moderate pain. ) 90 tablet 0  . diphenhydrAMINE (BENADRYL) 25 mg capsule Take 50 mg by mouth at bedtime as needed for sleep.    . Linaclotide (LINZESS) 145 MCG CAPS capsule Take 145 mcg by mouth daily. Dr. Watt Climes    . metoCLOPramide (REGLAN) 10 MG tablet Take 1 tablet (10 mg total) by mouth daily as needed for nausea. 15 tablet 1  . omeprazole (PRILOSEC) 20 MG capsule Take 1 capsule (20 mg total) by mouth daily. For 2 weeks, then as needed 30 capsule 0  . promethazine (PHENERGAN) 25 MG tablet Take 1 tablet (25 mg total) by mouth every 8 (eight) hours as needed for nausea or vomiting. 60 tablet 2  . belladona alk-PHENObarbital (DONNATAL) 16.2 MG tablet Take 1 tablet by mouth as needed.    . polyethylene glycol powder (GLYCOLAX/MIRALAX) powder Take 17 g by mouth daily. (Patient not taking: Reported on 05/11/2015) 3350 g 1  . PRESCRIPTION MEDICATION Cortizone injection in neck at dr's office    . sucralfate (CARAFATE) 1 G tablet Take 1 tablet (1 g total) by mouth 4 (four) times daily -  with meals and at bedtime. (Patient not taking: Reported on 05/11/2015) 30 tablet 0  . sucralfate (CARAFATE) 1 GM/10ML suspension 1 g       No facility-administered medications prior to visit.    ROS Review of Systems  Constitutional: Negative for fever, chills, fatigue and unexpected weight change.  Eyes: Negative for visual disturbance.  Respiratory: Negative for cough and shortness of breath.   Cardiovascular: Negative for chest pain, palpitations and leg swelling.  Gastrointestinal: Negative for nausea, vomiting, abdominal pain, diarrhea, constipation and blood in stool.  Endocrine: Negative for polydipsia, polyphagia and polyuria.  Musculoskeletal: Negative for myalgias, back pain, arthralgias, gait problem and neck pain.  Skin: Negative for rash.  Allergic/Immunologic: Negative for immunocompromised state.  Hematological: Negative for adenopathy. Does not bruise/bleed easily.  Psychiatric/Behavioral: Negative for suicidal ideas, sleep disturbance and dysphoric mood. The patient is not nervous/anxious.     Objective:  BP 116/76 mmHg  Pulse 100  Temp(Src) 98.5 F (36.9 C) (Oral)  Resp 16  Ht 5' 7"  (1.702 m)  Wt 140 lb (63.504 kg)  BMI 21.92 kg/m2  SpO2 98%  BP/Weight 05/11/2015 05/10/2015 74/16/3845  Systolic BP 364 680 321  Diastolic BP 76 76 84  Wt. (Lbs) 140 - 144  BMI 21.92 - 22.55     Physical Exam  Constitutional: He appears well-developed and well-nourished. No distress.  Thin, pale, white male   HENT:  Head: Normocephalic and atraumatic.  Mouth/Throat: Oropharynx is clear and moist and mucous membranes are normal. No oral lesions. Abnormal dentition.  Dental caries present. No dental abscesses.  Eyes: Conjunctivae are normal. Pupils are equal, round, and reactive to light.  Neck: Normal range of motion. Neck supple.  Cardiovascular: Normal rate, regular rhythm, normal heart sounds and intact distal pulses.   Pulmonary/Chest: Effort normal and breath sounds normal.  Abdominal: Soft. Bowel sounds are normal. He exhibits no distension and no mass. There is no tenderness. There is no rebound and  no guarding.  Musculoskeletal: He exhibits no edema.  Neurological: He is alert.  Skin: Skin is warm and dry. No rash noted. No erythema.  Psychiatric: His speech is normal. He exhibits a depressed mood.   Depression screen [facility] 2/9 05/11/2015 05/04/2015 01/04/2015 03/08/2014 03/02/2014  Decreased Interest 3 0 3 0 0  Down, Depressed, Hopeless 3 0 3 0 0  PHQ - 2 Score 6 0 6 0 0  Altered sleeping 3 - 3 - -  Tired, decreased energy 3 - 3 - -  Change in appetite 3 - 3 - -  Feeling bad or failure about yourself  3 - 3 - -  Trouble concentrating 2 - 3 - -  Moving slowly or fidgety/restless 2 - 3 - -  Suicidal thoughts 2 - 2 - -  PHQ-9 Score 24 - 26 - -    GAD 7 : Generalized Anxiety Score 05/11/2015  Nervous, Anxious, on Edge 2  Control/stop worrying 3  Worry too much - different things 2  Trouble relaxing 2  Restless 1  Easily annoyed or irritable 2  Afraid - awful might happen 1  Total GAD 7 Score 13      Assessment & Plan:   Problem List Items Addressed This Visit    Anxiety - Primary (Chronic)   Relevant Medications   clonazePAM (KLONOPIN) 0.5 MG tablet   Odynophagia   Relevant Medications   sucralfate (CARAFATE) 1 G tablet    Other Visit Diagnoses    Painful swallowing        Relevant Medications    sucralfate (CARAFATE) 1 G tablet       Meds ordered this encounter  Medications  . clonazePAM (KLONOPIN) 0.5 MG tablet    Sig: Take 1 tablet (0.5 mg total) by mouth 2 (two) times daily.    Dispense:  60 tablet    Refill:  0  . sucralfate (CARAFATE) 1 G tablet    Sig: Take 1 tablet (1 g total) by mouth 4 (four) times daily -  with meals and at bedtime. Dissolve tablet in water    Dispense:  30 tablet    Refill:  0    Follow-up: No Follow-up on file.   Boykin Nearing MD

## 2015-05-17 ENCOUNTER — Telehealth: Payer: Self-pay | Admitting: Family Medicine

## 2015-05-17 NOTE — Telephone Encounter (Signed)
Patient called stating that his teeth are bothering him and he is having a lot of pain. He would like some advice on what to do next. Please f/u

## 2015-05-18 NOTE — Telephone Encounter (Signed)
Returned call  Notified Dental referral placed  Mail low cost Dental information to pt

## 2015-05-21 ENCOUNTER — Other Ambulatory Visit: Payer: Self-pay | Admitting: Gastroenterology

## 2015-05-21 ENCOUNTER — Encounter (HOSPITAL_COMMUNITY): Payer: Self-pay | Admitting: *Deleted

## 2015-05-21 NOTE — Progress Notes (Signed)
Pt states that Zoloft caused him to have an irregular heart rate at one time. Denies any other cardiac history.   Pt states he has some "bad" teeth and wouldn't be surprised if they got broken during anesthesia.

## 2015-05-21 NOTE — Addendum Note (Signed)
Addended byVida Rigger: Trevious Rampey on: 05/21/2015 02:54 PM   Modules accepted: Orders

## 2015-05-22 ENCOUNTER — Ambulatory Visit (HOSPITAL_COMMUNITY)
Admission: RE | Admit: 2015-05-22 | Discharge: 2015-05-22 | Disposition: A | Discharge status: Home / Self Care | Payer: Self-pay | Source: Ambulatory Visit | Attending: Gastroenterology | Admitting: Gastroenterology

## 2015-05-22 ENCOUNTER — Ambulatory Visit (HOSPITAL_COMMUNITY): Payer: Self-pay | Admitting: Anesthesiology

## 2015-05-22 ENCOUNTER — Encounter (HOSPITAL_COMMUNITY)
Admission: RE | Disposition: A | Discharge status: Home / Self Care | Payer: Self-pay | Source: Ambulatory Visit | Attending: Gastroenterology

## 2015-05-22 ENCOUNTER — Inpatient Hospital Stay (HOSPITAL_COMMUNITY): Admission: RE | Admit: 2015-05-22 | Payer: Self-pay | Source: Ambulatory Visit

## 2015-05-22 ENCOUNTER — Encounter (HOSPITAL_COMMUNITY): Payer: Self-pay | Admitting: *Deleted

## 2015-05-22 DIAGNOSIS — K449 Diaphragmatic hernia without obstruction or gangrene: Secondary | ICD-10-CM | POA: Insufficient documentation

## 2015-05-22 DIAGNOSIS — B3781 Candidal esophagitis: Secondary | ICD-10-CM | POA: Insufficient documentation

## 2015-05-22 DIAGNOSIS — R1310 Dysphagia, unspecified: Secondary | ICD-10-CM | POA: Insufficient documentation

## 2015-05-22 HISTORY — DX: Cardiac arrhythmia, unspecified: I49.9

## 2015-05-22 HISTORY — PX: ESOPHAGOGASTRODUODENOSCOPY: SHX5428

## 2015-05-22 HISTORY — DX: Constipation, unspecified: K59.00

## 2015-05-22 HISTORY — DX: Bipolar disorder, unspecified: F31.9

## 2015-05-22 SURGERY — EGD (ESOPHAGOGASTRODUODENOSCOPY)
Anesthesia: Monitor Anesthesia Care

## 2015-05-22 MED ORDER — FENTANYL CITRATE (PF) 100 MCG/2ML IJ SOLN
INTRAMUSCULAR | Status: DC | PRN
  Administered 2015-05-22: 100 ug via INTRAVENOUS
  Administered 2015-05-22: 50 ug via INTRAVENOUS
  Administered 2015-05-22: 100 ug via INTRAVENOUS

## 2015-05-22 MED ORDER — LACTATED RINGERS IV SOLN
INTRAVENOUS | Status: DC
  Administered 2015-05-22: 12:00:00 via INTRAVENOUS

## 2015-05-22 MED ORDER — PROPOFOL 10 MG/ML IV BOLUS
INTRAVENOUS | Status: DC | PRN
  Administered 2015-05-22 (×2): 50 mg via INTRAVENOUS

## 2015-05-22 MED ORDER — SODIUM CHLORIDE 0.9 % IV SOLN: INTRAVENOUS | Status: DC

## 2015-05-22 MED ORDER — FENTANYL CITRATE (PF) 100 MCG/2ML IJ SOLN
100.0000 ug | Freq: Once | INTRAMUSCULAR | Status: AC
  Administered 2015-05-22: 100 ug via INTRAVENOUS

## 2015-05-22 MED ORDER — ONDANSETRON HCL 4 MG/2ML IJ SOLN
INTRAMUSCULAR | Status: AC
  Filled 2015-05-22: qty 2

## 2015-05-22 MED ORDER — LIDOCAINE HCL (CARDIAC) 20 MG/ML IV SOLN
INTRAVENOUS | Status: DC | PRN
  Administered 2015-05-22: 100 mg via INTRAVENOUS

## 2015-05-22 MED ORDER — ONDANSETRON HCL 4 MG/2ML IJ SOLN
4.0000 mg | Freq: Once | INTRAMUSCULAR | Status: AC
  Administered 2015-05-22: 4 mg via INTRAVENOUS

## 2015-05-22 MED ORDER — FENTANYL CITRATE (PF) 100 MCG/2ML IJ SOLN
INTRAMUSCULAR | Status: AC
  Filled 2015-05-22: qty 2

## 2015-05-22 MED ORDER — BUTAMBEN-TETRACAINE-BENZOCAINE 2-2-14 % EX AERO
INHALATION_SPRAY | CUTANEOUS | Status: DC | PRN
  Administered 2015-05-22: 2 via TOPICAL

## 2015-05-22 MED ORDER — MIDAZOLAM HCL 5 MG/5ML IJ SOLN
INTRAMUSCULAR | Status: DC | PRN
  Administered 2015-05-22: 2 mg via INTRAVENOUS

## 2015-05-22 NOTE — Progress Notes (Signed)
Theotis BarrioMichael N Mcatee 1:12 PM  Subjective: Patient with no significant new complaints although has been taking more ibuprofen and was started on Klonopin and does have some painful swallowing but no dysphagia and no other new complaints  Objective: Vital signs stable afebrile no acute distress exam please see preassessment evaluation  Assessment: Chronic nausea vomiting abdominal pain questionable etiology  Plan: Okay to proceed with endoscopy with anesthesia assistance  [facility],Kathyjo Briere E  Pager [phone] After 5PM or if no answer call [phone]

## 2015-05-22 NOTE — Discharge Instructions (Addendum)
Call if question or problem and have soft solids today and pick up a prescription for a liquid medicine which was called in by my nurse for nystatin swish and swallow 4 times a day for a week and call me in 1 week for the biopsy results and let me know how you're swallowing is doing and then we will discuss when to follow-upEsophagogastroduodenoscopy, Care After Refer to this sheet in the next few weeks. These instructions provide you with information about caring for yourself after your procedure. Your health care provider may also give you more specific instructions. Your treatment has been planned according to current medical practices, but problems sometimes occur. Call your health care provider if you have any problems or questions after your procedure. WHAT TO EXPECT AFTER THE PROCEDURE After your procedure, it is typical to feel:  Soreness in your throat.  Pain with swallowing.  Sick to your stomach (nauseous).  Bloated.  Dizzy.  Fatigued. HOME CARE INSTRUCTIONS  Do not eat or drink anything until the numbing medicine (local anesthetic) has worn off and your gag reflex has returned. You will know that the local anesthetic has worn off when you can swallow comfortably.  Do not drive or operate machinery until directed by your health care provider.  Take medicines only as directed by your health care provider. SEEK MEDICAL CARE IF:   You cannot stop coughing.  You are not urinating at all or less than usual. SEEK IMMEDIATE MEDICAL CARE IF:  You have difficulty swallowing.  You cannot eat or drink.  You have worsening throat or chest pain.  You have dizziness or lightheadedness or you faint.  You have nausea or vomiting.  You have chills.  You have a fever.  You have severe abdominal pain.  You have black, tarry, or bloody stools.   This information is not intended to replace advice given to you by your health care provider. Make sure you discuss any questions you  have with your health care provider.   Document Released: 06/09/2012 Document Revised: 07/14/2014 Document Reviewed: 06/09/2012 Elsevier Interactive Patient Education [employer]2016 Elsevier Inc.

## 2015-05-22 NOTE — Transfer of Care (Signed)
Immediate Anesthesia Transfer of Care Note  Patient: [name]  Procedure(s) Performed: Procedure(s): ESOPHAGOGASTRODUODENOSCOPY (EGD) (N/A)  Patient Location: Endoscopy Unit  Anesthesia Type:MAC  Level of Consciousness: awake, alert  and oriented  Airway & Oxygen Therapy: Patient Spontanous Breathing and Patient connected to nasal cannula oxygen  Post-op Assessment: Report given to RN, Post -op Vital signs reviewed and stable and Patient moving all extremities X 4  Post vital signs: Reviewed and stable  Last Vitals:  Filed Vitals:   05/22/15 1143  BP: 112/69  Pulse: 99  Resp: 18    Complications: No apparent anesthesia complications

## 2015-05-22 NOTE — Anesthesia Preprocedure Evaluation (Addendum)
Anesthesia Evaluation  Patient identified by MRN, date of birth, ID band Patient awake    Reviewed: Allergy & Precautions, H&P , NPO status , Patient's Chart, lab work & pertinent test results  Airway Mallampati: II  TM Distance: >3 FB Neck ROM: Full    Dental no notable dental hx. (+) Poor Dentition, Dental Advisory Given   Pulmonary neg pulmonary ROS,    Pulmonary exam normal breath sounds clear to auscultation       Cardiovascular negative cardio ROS  + dysrhythmias  Rhythm:Regular Rate:Normal     Neuro/Psych PSYCHIATRIC DISORDERS Anxiety Depression Bipolar Disorder negative neurological ROS     GI/Hepatic negative GI ROS, Neg liver ROS,   Endo/Other  negative endocrine ROS  Renal/GU negative Renal ROS  negative genitourinary   Musculoskeletal   Abdominal   Peds  Hematology negative hematology ROS (+)   Anesthesia Other Findings   Reproductive/Obstetrics negative OB ROS                            Anesthesia Physical Anesthesia Plan  ASA: II  Anesthesia Plan: MAC   Post-op Pain Management:    Induction: Intravenous  Airway Management Planned: Nasal Cannula and Natural Airway  Additional Equipment:   Intra-op Plan:   Post-operative Plan:   Informed Consent: I have reviewed the patients History and Physical, chart, labs and discussed the procedure including the risks, benefits and alternatives for the proposed anesthesia with the patient or authorized representative who has indicated his/her understanding and acceptance.   Dental advisory given  Plan Discussed with: CRNA  Anesthesia Plan Comments:         Anesthesia Quick Evaluation

## 2015-05-22 NOTE — Anesthesia Postprocedure Evaluation (Signed)
  Anesthesia Post-op Note  Patient: Graylon GunningMichael N Biancardi  Procedure(s) Performed: Procedure(s): ESOPHAGOGASTRODUODENOSCOPY (EGD) (N/A)  Patient Location: PACU  Anesthesia Type: MAC  Level of Consciousness: awake and alert   Airway and Oxygen Therapy: Patient Spontanous Breathing  Post-op Pain: Controlled  Post-op Assessment: Post-op Vital signs reviewed, Patient's Cardiovascular Status Stable and Respiratory Function Stable  Post-op Vital Signs: Reviewed  Filed Vitals:   05/22/15 1341  BP: 139/82  Pulse:   Resp: 10    Complications: No apparent anesthesia complications

## 2015-05-22 NOTE — Op Note (Signed)
[facility] [address] [place], [id number]   ENDOSCOPY PROCEDURE REPORT  PATIENT: Levi Palmer, Levi Palmer  MR#: [id number] BIRTHDATE: January 08, 1986 , 29  yrs. old GENDER: male ENDOSCOPIST: Vida RiggerMarc Angelia Hazell, MD REFERRED BY:  Dessa PhiJosalyn Funches, MD PROCEDURE DATE:  05/22/2015 PROCEDURE:  EGD w/ biopsy  and brushing ASA CLASS:     Class II INDICATIONS:  vomiting, epigastric abdominal pain, and abdominal pain in the upper right quadrant.  odynophagia MEDICATIONS: Propofol 200 mg IV, Fentanyl 250 mcg IV, and Versed 2 mg IV    lidocaine 100 mg TOPICAL ANESTHETIC: Cetacaine Spray  DESCRIPTION OF PROCEDURE: After the risks benefits and alternatives of the procedure were thoroughly explained, informed consent was obtained.  The Pentax Gastroscope [id number] endoscope was introduced through the mouth and advanced to the second portion of the duodenum , Without limitations.  The instrument was slowly withdrawn as the mucosa was fully examined. Estimated blood loss is zero unless otherwise noted in this procedure report.    the findings are recorded below       Retroflexed views revealed no abnormalities.     The scope was then withdrawn from the patient and the procedure completed.  COMPLICATIONS: There were no immediate complications.  ENDOSCOPIC IMPRESSION: 1. Probable moderate Candida esophagitis status post brushing and biopsy      2. Tiny hiatal hernia 3. Otherwise within normal limits EGD  RECOMMENDATIONS: Will begin nystatin swish and swallow if no recent antibiotics or steroids or other risk factors will need immune workup by primary care and will call with biopsy results in one week and follow up when necessary  and consider gastric emptying study next  REPEAT EXAM: as needed  eSigned:  Vida RiggerMarc Mat Stuard, MD 05/22/2015 1:42 PM    XB:[id number]:Josalyn Armen PickupFunches, MD  CPT CODES: ICD CODES:  The ICD and CPT codes recommended by this software are interpretations from the  data that the clinical staff has captured with the software.  The verification of the translation of this report to the ICD and CPT codes and modifiers is the sole responsibility of the health care institution and practicing physician where this report was generated.  [employer] will not be held responsible for the validity of the ICD and CPT codes included on this report.  AMA assumes no liability for data contained or not contained herein. CPT is a Publishing rights managerregistered trademark of the [employer].  PATIENT NAME:  Levi Palmer, Levi Palmer MR#: [id number]

## 2015-05-23 ENCOUNTER — Telehealth: Payer: Self-pay

## 2015-05-23 DIAGNOSIS — G8929 Other chronic pain: Secondary | ICD-10-CM

## 2015-05-23 DIAGNOSIS — R109 Unspecified abdominal pain: Secondary | ICD-10-CM

## 2015-05-23 DIAGNOSIS — B3781 Candidal esophagitis: Secondary | ICD-10-CM

## 2015-05-23 NOTE — Telephone Encounter (Signed)
Nurse called patient, patient verified date of birth. Nurse received telephone note from medical call center [facility]. Patient explains he is taking Tylenol 3, "I'm supposed to take it twice a day but I'm taking it more". Patient is taking 1 Tylenol 3, every 4-5 hours. Patient was prescribed Tylenol 3 for stomach pain, it eases stomach pain but does not stop the pain.  Patient has other pain located in neck, due to bulging and protruding disks. Tylenol 3 is not helping.  Patient wants to know options because the OTC medications, ibuprofen and tylenol, would cause more harm than good due to amount he would need to take to control pain.  Patient is having upper, right pain in abdomen, constant, and is worse with bowel movement. Patient reports having regular bowel movements because he is taking Linzess for bowel movements.  Patient has tried drinking more water to be hydrated. Patient is still nauseous.

## 2015-05-24 ENCOUNTER — Encounter (HOSPITAL_COMMUNITY): Payer: Self-pay | Admitting: Gastroenterology

## 2015-05-24 DIAGNOSIS — B3781 Candidal esophagitis: Secondary | ICD-10-CM | POA: Insufficient documentation

## 2015-05-24 MED ORDER — ACETAMINOPHEN-CODEINE #3 300-30 MG PO TABS: 1.0000 | ORAL_TABLET | Freq: Four times a day (QID) | ORAL | Status: DC | PRN

## 2015-05-24 MED ORDER — FLUCONAZOLE 200 MG PO TABS: 200.0000 mg | ORAL_TABLET | Freq: Every day | ORAL | Status: DC

## 2015-05-24 NOTE — Telephone Encounter (Signed)
Called patient Informed him of candida esophagitis will treat with diflucan. He has normal blood sugar. Needs HIV screening. No IVDU. Had recent antibiotics for tooth infection.   Chest pain is likely from candida esophagitis, so anticipate improvement with treatment Negotiated that q 4 tylenol #3 is too much, q 6 instead, refilled tylenol #3 120 tabs  Patient reports that klonopin is helping  Patient agreed with plan and will come to pick up diflucan and refill tylenol #3 as well as get blood drawn for screening HIV.

## 2015-05-25 ENCOUNTER — Ambulatory Visit: Payer: MEDICAID | Attending: Family Medicine

## 2015-05-25 DIAGNOSIS — B3781 Candidal esophagitis: Secondary | ICD-10-CM

## 2015-05-26 LAB — HIV ANTIBODY (ROUTINE TESTING W REFLEX): HIV: NONREACTIVE

## 2015-05-29 ENCOUNTER — Encounter: Payer: Self-pay | Admitting: Family Medicine

## 2015-05-29 ENCOUNTER — Other Ambulatory Visit: Payer: Self-pay | Admitting: *Deleted

## 2015-05-29 ENCOUNTER — Ambulatory Visit: Payer: Self-pay | Attending: Family Medicine | Admitting: Family Medicine

## 2015-05-29 VITALS — BP 126/75 | HR 70 | Temp 98.2°F | Resp 16 | Ht 67.0 in | Wt 147.0 lb

## 2015-05-29 DIAGNOSIS — G8929 Other chronic pain: Secondary | ICD-10-CM

## 2015-05-29 DIAGNOSIS — F419 Anxiety disorder, unspecified: Secondary | ICD-10-CM

## 2015-05-29 DIAGNOSIS — Z79899 Other long term (current) drug therapy: Secondary | ICD-10-CM | POA: Insufficient documentation

## 2015-05-29 DIAGNOSIS — K0889 Other specified disorders of teeth and supporting structures: Secondary | ICD-10-CM | POA: Insufficient documentation

## 2015-05-29 DIAGNOSIS — M25562 Pain in left knee: Secondary | ICD-10-CM | POA: Insufficient documentation

## 2015-05-29 DIAGNOSIS — R109 Unspecified abdominal pain: Secondary | ICD-10-CM

## 2015-05-29 MED ORDER — LINACLOTIDE 145 MCG PO CAPS: 290.0000 ug | ORAL_CAPSULE | Freq: Every day | ORAL | Status: DC

## 2015-05-29 MED ORDER — METHYLPREDNISOLONE ACETATE 40 MG/ML IJ SUSP
40.0000 mg | Freq: Once | INTRAMUSCULAR | Status: AC
  Administered 2015-05-29: 40 mg via INTRA_ARTICULAR

## 2015-05-29 MED ORDER — PROMETHAZINE HCL 25 MG PO TABS: 25.0000 mg | ORAL_TABLET | Freq: Three times a day (TID) | ORAL | Status: DC | PRN

## 2015-05-29 MED ORDER — CLONAZEPAM 0.5 MG PO TABS: 0.5000 mg | ORAL_TABLET | Freq: Two times a day (BID) | ORAL | Status: DC

## 2015-05-29 NOTE — Assessment & Plan Note (Signed)
A: pain with recent injury. No deformity. No joint laxity P: Steroid injection for pain relief

## 2015-05-29 NOTE — Progress Notes (Signed)
Patient ID: Levi Palmer Christley, male   DOB: 03-Feb-1986, 29 y.o.   MRN: [id number] Patient ID: Levi Palmer Pulver, male   DOB: 03-Feb-1986, 29 y.o.   MRN: [id number]   Subjective:  Patient ID: Levi Palmer Vanzanten, male    DOB: 03-Feb-1986  Age: 29 y.o. MRN: [id number]  CC: Anxiety   HPI Levi Palmer Seales presents for   1. Anxiety: chronic. Improved with klonopin. Patient is calmer. Overall less depressed.   2. Dental pain: persistent. Upper incisor pain. No oral swelling. Patient now has money for dental co-pay. Needs # to reschedule appt.   3. Candida esophagitis: found on EGD. Patient has started diflucan. Pain has improved. Patient has come for screening HIV which was negative.   4. L knee pain: x 2 weeks. Hyper-extended knee at home. No fall. No direct trauma. Patient reports lateral and posterior knee pain. No redness. Patient reports swelling. Pain is overall getting worse over the past two weeks.    Social History  Substance Use Topics  . Smoking status: Never Smoker   . Smokeless tobacco: Never Used  . Alcohol Use: No    Outpatient Prescriptions Prior to Visit  Medication Sig Dispense Refill  . acetaminophen-codeine (TYLENOL #3) 300-30 MG tablet Take 1 tablet by mouth every 6 (six) hours as needed for moderate pain. 120 tablet 0  . clidinium-chlordiazePOXIDE (LIBRAX) 5-2.5 MG capsule Take 1 capsule by mouth daily.    . clonazePAM (KLONOPIN) 0.5 MG tablet Take 1 tablet (0.5 mg total) by mouth 2 (two) times daily. 60 tablet 0  . diphenhydrAMINE (BENADRYL) 25 mg capsule Take 50 mg by mouth at bedtime as needed for sleep.    . fluconazole (DIFLUCAN) 200 MG tablet Take 1 tablet (200 mg total) by mouth daily. 400 mg on day one, then 200 mg daily for 14-21 days 22 tablet 0  . Linaclotide (LINZESS) 145 MCG CAPS capsule Take 145 mcg by mouth daily. Dr. Ewing SchleinMagod    . metoCLOPramide (REGLAN) 10 MG tablet Take 1 tablet (10 mg total) by mouth daily as needed for nausea. 15 tablet 1  . omeprazole  (PRILOSEC) 20 MG capsule Take 1 capsule (20 mg total) by mouth daily. For 2 weeks, then as needed 30 capsule 0  . polyethylene glycol powder (GLYCOLAX/MIRALAX) powder Take 17 g by mouth daily. 3350 g 1  . promethazine (PHENERGAN) 25 MG tablet Take 1 tablet (25 mg total) by mouth every 8 (eight) hours as needed for nausea or vomiting. 60 tablet 2  . sucralfate (CARAFATE) 1 G tablet Take 1 tablet (1 g total) by mouth 4 (four) times daily -  with meals and at bedtime. Dissolve tablet in water (Patient not taking: Reported on 05/29/2015) 30 tablet 0   No facility-administered medications prior to visit.    ROS Review of Systems  Constitutional: Negative for fever, chills, fatigue and unexpected weight change.  Eyes: Negative for visual disturbance.  Respiratory: Negative for cough and shortness of breath.   Cardiovascular: Negative for chest pain, palpitations and leg swelling.  Gastrointestinal: Positive for nausea, vomiting and abdominal pain. Negative for diarrhea, constipation and blood in stool.  Endocrine: Negative for polydipsia, polyphagia and polyuria.  Musculoskeletal: Positive for arthralgias. Negative for myalgias, back pain, gait problem and neck pain.  Skin: Negative for rash.  Allergic/Immunologic: Negative for immunocompromised state.  Hematological: Negative for adenopathy. Does not bruise/bleed easily.  Psychiatric/Behavioral: Positive for dysphoric mood. Negative for suicidal ideas and sleep disturbance. The patient is nervous/anxious.  Objective:  BP 126/75 mmHg  Pulse 70  Temp(Src) 98.2 F (36.8 C) (Oral)  Resp 16  Ht  (1.702 m)  Wt 147 lb (66.679 kg)  BMI 23.02 kg/m2  SpO2 98%  BP/Weight 05/29/2015 05/22/2015 05/11/2015  Systolic BP 126 148 116  Diastolic BP 75 75 76  Wt. (Lbs) 147 - 140  BMI 23.02 - 21.92     Physical Exam  Constitutional: He appears well-developed and well-nourished. No distress.  Thin, pale, white male   HENT:  Head:  Normocephalic and atraumatic.  Mouth/Throat: Oropharynx is clear and moist and mucous membranes are normal. No oral lesions. Abnormal dentition. Dental caries present. No dental abscesses.    Eyes: Conjunctivae are normal. Pupils are equal, round, and reactive to light.  Neck: Normal range of motion. Neck supple.  Cardiovascular: Normal rate, regular rhythm, normal heart sounds and intact distal pulses.   Pulmonary/Chest: Effort normal and breath sounds normal.  Abdominal: Soft. Bowel sounds are normal. He exhibits no distension and no mass. There is no tenderness. There is no rebound and no guarding.  Musculoskeletal: He exhibits no edema.       Left knee: He exhibits abnormal patellar mobility. He exhibits normal range of motion, no swelling, no effusion, no ecchymosis, no deformity, no laceration, no erythema, normal alignment and no LCL laxity. Tenderness found. Lateral joint line tenderness noted. No patellar tendon tenderness noted.  Neurological: He is alert.  Skin: Skin is warm and dry. No rash noted. No erythema.  Psychiatric: His speech is normal. He exhibits a depressed mood.    After obtaining informed consent and cleaning the skin using iodine and alcohol a  steroid injection was performed at L knee using 1% plain Lidocaine and 40 mg of Depo Medrol. This was well tolerated  Depression screen [facility] 2/9 05/29/2015 05/11/2015 05/04/2015 01/04/2015 03/08/2014  Decreased Interest 2 3 0 3 0  Down, Depressed, Hopeless 2 3 0 3 0  PHQ - 2 Score 4 6 0 6 0  Altered sleeping 1 3 - 3 -  Tired, decreased energy 2 3 - 3 -  Change in appetite 3 3 - 3 -  Feeling bad or failure about yourself  3 3 - 3 -  Trouble concentrating 1 2 - 3 -  Moving slowly or fidgety/restless 0 2 - 3 -  Suicidal thoughts 0 2 - 2 -  PHQ-9 Score 14 24 - 26 -    GAD 7 : Generalized Anxiety Score 05/29/2015 05/11/2015  Nervous, Anxious, on Edge 2 2  Control/stop worrying 3 3  Worry too much - different things 3 2    Trouble relaxing 1 2  Restless 0 1  Easily annoyed or irritable 1 2  Afraid - awful might happen 2 1  Total GAD 7 Score 12 13      Assessment & Plan:   Problem List Items Addressed This Visit    Anxiety - Primary (Chronic)   Relevant Medications   clonazePAM (KLONOPIN) 0.5 MG tablet   Chronic abdominal pain (Chronic)   Relevant Medications   clonazePAM (KLONOPIN) 0.5 MG tablet   promethazine (PHENERGAN) 25 MG tablet   methylPREDNISolone acetate (DEPO-MEDROL) injection 40 mg (Completed)   Left knee pain   Relevant Medications   methylPREDNISolone acetate (DEPO-MEDROL) injection 40 mg (Completed)      No orders of the defined types were placed in this encounter.    Follow-up: No Follow-up on file.   Dessa Phi MD

## 2015-05-29 NOTE — Progress Notes (Signed)
F/U anxiety  Improving since last visit No tobacco user No suicide thought in the past two week

## 2015-05-29 NOTE — Patient Instructions (Addendum)
Levi Palmer was seen today for anxiety.  Diagnoses and all orders for this visit:  Anxiety -     clonazePAM (KLONOPIN) 0.5 MG tablet; Take 1 tablet (0.5 mg total) by mouth 2 (two) times daily.  Chronic abdominal pain -     promethazine (PHENERGAN) 25 MG tablet; Take 1 tablet (25 mg total) by mouth every 8 (eight) hours as needed for nausea or vomiting.  Left knee pain -     methylPREDNISolone acetate (DEPO-MEDROL) injection 40 mg; Inject 1 mL (40 mg total) into the articular space once.  You have received a shot of steroid in your joint today. Rest and ice knee today. Regular activity tomorrow. Look out for redness, swelling, fever,severe pain in joint and call if you experience these symptoms.   [facility] ph. # [phone] [address] [place], [place] [address]    F/u in 2 months, sooner if needed for anxiety   Dr. Armen PickupFunches

## 2015-06-07 ENCOUNTER — Other Ambulatory Visit (HOSPITAL_COMMUNITY): Payer: Self-pay | Admitting: Gastroenterology

## 2015-06-07 DIAGNOSIS — R112 Nausea with vomiting, unspecified: Secondary | ICD-10-CM

## 2015-06-07 DIAGNOSIS — R1084 Generalized abdominal pain: Secondary | ICD-10-CM

## 2015-06-12 ENCOUNTER — Emergency Department (HOSPITAL_COMMUNITY)
Admission: EM | Admit: 2015-06-12 | Discharge: 2015-06-12 | Disposition: A | Discharge status: Home / Self Care | Payer: Self-pay | Attending: Emergency Medicine | Admitting: Emergency Medicine

## 2015-06-12 ENCOUNTER — Telehealth: Payer: Self-pay | Admitting: Family Medicine

## 2015-06-12 ENCOUNTER — Encounter (HOSPITAL_COMMUNITY): Payer: Self-pay | Admitting: *Deleted

## 2015-06-12 ENCOUNTER — Emergency Department (HOSPITAL_COMMUNITY): Payer: Self-pay

## 2015-06-12 DIAGNOSIS — Z862 Personal history of diseases of the blood and blood-forming organs and certain disorders involving the immune mechanism: Secondary | ICD-10-CM | POA: Insufficient documentation

## 2015-06-12 DIAGNOSIS — R109 Unspecified abdominal pain: Secondary | ICD-10-CM

## 2015-06-12 DIAGNOSIS — R1031 Right lower quadrant pain: Secondary | ICD-10-CM | POA: Insufficient documentation

## 2015-06-12 DIAGNOSIS — F329 Major depressive disorder, single episode, unspecified: Secondary | ICD-10-CM | POA: Insufficient documentation

## 2015-06-12 DIAGNOSIS — Z9049 Acquired absence of other specified parts of digestive tract: Secondary | ICD-10-CM | POA: Insufficient documentation

## 2015-06-12 DIAGNOSIS — Z79899 Other long term (current) drug therapy: Secondary | ICD-10-CM | POA: Insufficient documentation

## 2015-06-12 DIAGNOSIS — G8929 Other chronic pain: Secondary | ICD-10-CM | POA: Insufficient documentation

## 2015-06-12 DIAGNOSIS — R1011 Right upper quadrant pain: Secondary | ICD-10-CM | POA: Insufficient documentation

## 2015-06-12 DIAGNOSIS — F410 Panic disorder [episodic paroxysmal anxiety] without agoraphobia: Secondary | ICD-10-CM | POA: Insufficient documentation

## 2015-06-12 DIAGNOSIS — K5900 Constipation, unspecified: Secondary | ICD-10-CM | POA: Insufficient documentation

## 2015-06-12 DIAGNOSIS — Z8679 Personal history of other diseases of the circulatory system: Secondary | ICD-10-CM | POA: Insufficient documentation

## 2015-06-12 LAB — COMPREHENSIVE METABOLIC PANEL
ALT: 16 U/L — AB (ref 17–63)
ANION GAP: 7 (ref 5–15)
AST: 25 U/L (ref 15–41)
Albumin: 4.6 g/dL (ref 3.5–5.0)
Alkaline Phosphatase: 83 U/L (ref 38–126)
BUN: 12 mg/dL (ref 6–20)
CHLORIDE: 103 mmol/L (ref 101–111)
CO2: 26 mmol/L (ref 22–32)
CREATININE: 0.97 mg/dL (ref 0.61–1.24)
Calcium: 9.8 mg/dL (ref 8.9–10.3)
Glucose, Bld: 110 mg/dL — ABNORMAL HIGH (ref 65–99)
POTASSIUM: 3.9 mmol/L (ref 3.5–5.1)
SODIUM: 136 mmol/L (ref 135–145)
Total Bilirubin: 0.4 mg/dL (ref 0.3–1.2)
Total Protein: 8.4 g/dL — ABNORMAL HIGH (ref 6.5–8.1)

## 2015-06-12 LAB — URINALYSIS, ROUTINE W REFLEX MICROSCOPIC
GLUCOSE, UA: NEGATIVE mg/dL
Hgb urine dipstick: NEGATIVE
Ketones, ur: 15 mg/dL — AB
LEUKOCYTES UA: NEGATIVE
Nitrite: NEGATIVE
PROTEIN: NEGATIVE mg/dL
SPECIFIC GRAVITY, URINE: 1.026 (ref 1.005–1.030)
pH: 5.5 (ref 5.0–8.0)

## 2015-06-12 LAB — CBC
HCT: 41.4 % (ref 39.0–52.0)
HEMOGLOBIN: 13 g/dL (ref 13.0–17.0)
MCH: 24.6 pg — AB (ref 26.0–34.0)
MCHC: 31.4 g/dL (ref 30.0–36.0)
MCV: 78.4 fL (ref 78.0–100.0)
PLATELETS: 324 10*3/uL (ref 150–400)
RBC: 5.28 MIL/uL (ref 4.22–5.81)
RDW: 16.6 % — ABNORMAL HIGH (ref 11.5–15.5)
WBC: 11.5 10*3/uL — AB (ref 4.0–10.5)

## 2015-06-12 LAB — LIPASE, BLOOD: LIPASE: 24 U/L (ref 11–51)

## 2015-06-12 MED ORDER — PROMETHAZINE HCL 25 MG/ML IJ SOLN
25.0000 mg | Freq: Once | INTRAMUSCULAR | Status: AC
  Administered 2015-06-12: 25 mg via INTRAVENOUS
  Filled 2015-06-12: qty 1

## 2015-06-12 MED ORDER — SODIUM CHLORIDE 0.9 % IV SOLN
1000.0000 mL | Freq: Once | INTRAVENOUS | Status: AC
  Administered 2015-06-12: 1000 mL via INTRAVENOUS

## 2015-06-12 MED ORDER — PB-HYOSCY-ATROPINE-SCOPOLAMINE 16.2 MG PO TABS
1.0000 | ORAL_TABLET | Freq: Four times a day (QID) | ORAL | Status: DC | PRN
  Filled 2015-06-12: qty 1

## 2015-06-12 MED ORDER — IOHEXOL 300 MG/ML  SOLN
80.0000 mL | Freq: Once | INTRAMUSCULAR | Status: AC | PRN
  Administered 2015-06-12: 80 mL via INTRAVENOUS

## 2015-06-12 NOTE — ED Notes (Signed)
PT reports A 4 day HX of RUQ pain with N/V. Pain scale 10/10

## 2015-06-12 NOTE — Telephone Encounter (Signed)
Pt. Called requesting to speak to nurse stating he went to the ER on 06/12/15 for severe abdominal pain and was giving medication for nausea and not for his pain. Pt stated he is in a lot of pain and scared, he also stated he ate a brownie and his stomach started to hurt. Please f/u with pt. ASAP

## 2015-06-12 NOTE — ED Notes (Signed)
Pt transported to CT ?

## 2015-06-12 NOTE — Discharge Instructions (Signed)

## 2015-06-12 NOTE — ED Provider Notes (Signed)
CSN: [id number]     Arrival date & time 06/12/15  7829 History   First MD Initiated Contact with Patient 06/12/15 [phone]     Chief Complaint  Patient presents with  . Abdominal Pain     (Consider location/radiation/quality/duration/timing/severity/associated sxs/prior Treatment) HPI Patient versus had abdominal pain for about 3-4 days now. He indicates his right upper and lower quadrant. This is been severe in nature. It has been gradually worsening. Patient does have history of chronic abdominal pain. He reports that typically the pain is more waxing and waning and does not last for this many days. He feels the pain quality and severity are different from what it normally occurs. He has had some vomiting. No diarrhea. No fever. No pain burning or urgency with Urination. Past Medical History  Diagnosis Date  . Medical history non-contributory   . Anxiety   . Panic attack as reaction to stress   . Anemia   . Depression   . Gallstones   . Dysrhythmia     irregular heart rate - due to zoloft  . Bipolar disorder (HCC)   . Cirrhosis (HCC)   . Constipation    Past Surgical History  Procedure Laterality Date  . Cholecystectomy    . Tonsillectomy    . Incise and drain abcess    . Mouth surgery    . Esophagogastroduodenoscopy N/A 05/22/2015    Procedure: ESOPHAGOGASTRODUODENOSCOPY (EGD);  Surgeon: Vida Rigger, MD;  [facility] ENDOSCOPY;  Service: Endoscopy;  Laterality: N/A;   Family History  Problem Relation Age of Onset  . Irritable bowel syndrome Mother   . Kidney disease Mother   . Ulcerative colitis Maternal Grandfather   . Colon cancer Neg Hx   . Colon polyps Neg Hx   . Gallbladder disease Neg Hx   . Esophageal cancer Neg Hx   . Diabetes Neg Hx    Social History  Substance Use Topics  . Smoking status: Never Smoker   . Smokeless tobacco: Never Used  . Alcohol Use: No    Review of Systems  10 Systems reviewed and are negative for acute change except as noted in the  HPI.   Allergies  Tramadol and Zoloft  Home Medications   Prior to Admission medications   Medication Sig Start Date End Date Taking? Authorizing Provider  acetaminophen-codeine (TYLENOL #3) 300-30 MG tablet Take 1 tablet by mouth every 6 (six) hours as needed for moderate pain. 05/24/15  Yes Josalyn Funches, MD  clidinium-chlordiazePOXIDE (LIBRAX) 5-2.5 MG capsule Take 1 capsule by mouth daily.   Yes Historical Provider, MD  clonazePAM (KLONOPIN) 0.5 MG tablet Take 1 tablet (0.5 mg total) by mouth 2 (two) times daily. 05/29/15  Yes Josalyn Funches, MD  diphenhydrAMINE (BENADRYL) 25 mg capsule Take 50 mg by mouth at bedtime as needed for sleep.   Yes Historical Provider, MD  fluconazole (DIFLUCAN) 200 MG tablet Take 1 tablet (200 mg total) by mouth daily. 400 mg on day one, then 200 mg daily for 14-21 days 05/24/15  Yes Dessa Phi, MD  Linaclotide (LINZESS) 145 MCG CAPS capsule Take 2 capsules (290 mcg total) by mouth daily. Dr. Ewing Schlein 05/29/15  Yes Dessa Phi, MD  omeprazole (PRILOSEC) 20 MG capsule Take 1 capsule (20 mg total) by mouth daily. For 2 weeks, then as needed 05/10/15  Yes Azalia Bilis, MD  promethazine (PHENERGAN) 25 MG tablet Take 1 tablet (25 mg total) by mouth every 8 (eight) hours as needed for nausea or vomiting. 05/29/15  Yes Dessa PhiJosalyn Funches, MD  metoCLOPramide (REGLAN) 10 MG tablet Take 1 tablet (10 mg total) by mouth daily as needed for nausea. Patient not taking: Reported on 06/12/2015 05/10/15   Azalia BilisKevin Campos, MD  polyethylene glycol powder (GLYCOLAX/MIRALAX) powder Take 17 g by mouth daily. Patient not taking: Reported on 06/12/2015 02/26/15   Dessa PhiJosalyn Funches, MD   BP 127/84 mmHg  Pulse 74  Temp(Src) 97.5 F (36.4 C)  Resp 16  Ht 5\' 7"  (1.702 m)  Wt 145 lb (65.772 kg)  BMI 22.71 kg/m2  SpO2 99% Physical Exam  Constitutional: He is oriented to person, place, and time. He appears well-developed and well-nourished.  HENT:  Head: Normocephalic and  atraumatic.  Eyes: EOM are normal. Pupils are equal, round, and reactive to light.  Neck: Neck supple.  Cardiovascular: Normal rate, regular rhythm, normal heart sounds and intact distal pulses.   Pulmonary/Chest: Effort normal and breath sounds normal.  Abdominal: Soft. Bowel sounds are normal. He exhibits no distension. There is tenderness.  Right lower quadrant tenderness to palpation. Moderate to severe. Remainder of abdomen is soft.  Musculoskeletal: Normal range of motion. He exhibits no edema.  Neurological: He is alert and oriented to person, place, and time. He has normal strength. Coordination normal. GCS eye subscore is 4. GCS verbal subscore is 5. GCS motor subscore is 6.  Skin: Skin is warm, dry and intact.  Psychiatric:  Anxious.    ED Course  Procedures (including critical care time) Labs Review Labs Reviewed  COMPREHENSIVE METABOLIC PANEL - Abnormal; Notable for the following:    Glucose, Bld 110 (*)    Total Protein 8.4 (*)    ALT 16 (*)    All other components within normal limits  CBC - Abnormal; Notable for the following:    WBC 11.5 (*)    MCH 24.6 (*)    RDW 16.6 (*)    All other components within normal limits  URINALYSIS, ROUTINE W REFLEX MICROSCOPIC (NOT AT [facility]) - Abnormal; Notable for the following:    Bilirubin Urine SMALL (*)    Ketones, ur 15 (*)    All other components within normal limits  LIPASE, BLOOD    Imaging Review Ct Abdomen Pelvis W Contrast  06/12/2015  CLINICAL DATA:  Right lower quadrant pain, nausea and vomiting for 3 days EXAM: CT ABDOMEN AND PELVIS WITH CONTRAST TECHNIQUE: Multidetector CT imaging of the abdomen and pelvis was performed using the standard protocol following bolus administration of intravenous contrast. CONTRAST:  80mL OMNIPAQUE IOHEXOL 300 MG/ML  SOLN COMPARISON:  08/07/2014 FINDINGS: Lung bases are unremarkable. The patient is status post cholecystectomy. There is indeterminate low-density lesion in right hepatic  lobe measures 4 mm. The pancreas, spleen and adrenal glands are unremarkable. Sagittal images of the spine are unremarkable. Abdominal aorta is unremarkable. Mild tortuous splenic veins are noted in splenic hilum. Kidneys are symmetrical in size and enhancement. No hydronephrosis or hydroureter. No aortic aneurysm. There is some fluid and gas in distal small bowel loops without significant small bowel distension. Mild distal enteritis cannot be excluded. There is no transition point in caliber of small bowel. No pericecal inflammation. The right colon is decompressed. Normal appendix is clearly visualized in coronal image 37. No colitis or diverticulitis. The left colon sigmoid colon and rectum are empty collapsed. Prostate gland and seminal vesicles are unremarkable. The urinary bladder is unremarkable. No inguinal adenopathy. IMPRESSION: 1. There is no evidence of acute inflammatory process within abdomen. 2. Subtle low-density lesion in  right hepatic lobe measures 4 mm too small to be characterized. 3. No hydronephrosis or hydroureter. 4. Status postcholecystectomy. 5. There is some fluid in nondistended distal small bowel loops. Mild enteritis cannot be excluded. No small bowel obstruction. 6. No pericecal inflammation.  Normal appendix. 7. No colitis or diverticulitis. Limited assessment of the colon which is empty collapsed. Electronically Signed   By: Natasha Mead M.D.   On: 06/12/2015 12:08   I have personally reviewed and evaluated these images and lab results as part of my medical decision-making.   EKG Interpretation None      MDM   Final diagnoses:  Chronic abdominal pain   Patient does have history of chronic abdominal pain. He does present reporting pain is worse and different than usual and more concentrated in the right lower quadrant. We did discuss the fact that he has chronic abdominal pain in that he has had significant diagnostic workup. He did however feel that the location,  duration and severity was not typical of his usual pain. The patient is being given one dose of Donnatal to try. He is advised to follow-up with his gastroenterologist this week to discuss ongoing management.    Arby Barrette, MD 06/12/15 1325

## 2015-06-12 NOTE — ED Notes (Signed)
While reviewing d/c instructions, pt becomes visibly agitated and states "no one did anything to help me at all today." Discussed w/ pt how he needs to follow up w/ GI MD for further management of chronic abdominal pain but no emergent reason for symptoms found today. Explained to pt other options for managing pain. No further questions/concerns at this time.

## 2015-06-13 ENCOUNTER — Emergency Department (HOSPITAL_COMMUNITY)
Admission: EM | Admit: 2015-06-13 | Discharge: 2015-06-13 | Disposition: A | Discharge status: Home / Self Care | Payer: Self-pay | Attending: Emergency Medicine | Admitting: Emergency Medicine

## 2015-06-13 ENCOUNTER — Encounter (HOSPITAL_COMMUNITY): Payer: Self-pay | Admitting: *Deleted

## 2015-06-13 DIAGNOSIS — G8929 Other chronic pain: Secondary | ICD-10-CM | POA: Insufficient documentation

## 2015-06-13 DIAGNOSIS — K5900 Constipation, unspecified: Secondary | ICD-10-CM | POA: Insufficient documentation

## 2015-06-13 DIAGNOSIS — Z79899 Other long term (current) drug therapy: Secondary | ICD-10-CM | POA: Insufficient documentation

## 2015-06-13 DIAGNOSIS — Z8619 Personal history of other infectious and parasitic diseases: Secondary | ICD-10-CM | POA: Insufficient documentation

## 2015-06-13 DIAGNOSIS — Z8679 Personal history of other diseases of the circulatory system: Secondary | ICD-10-CM | POA: Insufficient documentation

## 2015-06-13 DIAGNOSIS — F410 Panic disorder [episodic paroxysmal anxiety] without agoraphobia: Secondary | ICD-10-CM | POA: Insufficient documentation

## 2015-06-13 DIAGNOSIS — Z9889 Other specified postprocedural states: Secondary | ICD-10-CM | POA: Insufficient documentation

## 2015-06-13 DIAGNOSIS — R1011 Right upper quadrant pain: Secondary | ICD-10-CM

## 2015-06-13 DIAGNOSIS — Z9049 Acquired absence of other specified parts of digestive tract: Secondary | ICD-10-CM | POA: Insufficient documentation

## 2015-06-13 DIAGNOSIS — R1110 Vomiting, unspecified: Secondary | ICD-10-CM | POA: Insufficient documentation

## 2015-06-13 DIAGNOSIS — F319 Bipolar disorder, unspecified: Secondary | ICD-10-CM | POA: Insufficient documentation

## 2015-06-13 DIAGNOSIS — Z862 Personal history of diseases of the blood and blood-forming organs and certain disorders involving the immune mechanism: Secondary | ICD-10-CM | POA: Insufficient documentation

## 2015-06-13 DIAGNOSIS — R197 Diarrhea, unspecified: Secondary | ICD-10-CM | POA: Insufficient documentation

## 2015-06-13 MED ORDER — DICYCLOMINE HCL 10 MG PO CAPS
10.0000 mg | ORAL_CAPSULE | Freq: Once | ORAL | Status: AC
  Administered 2015-06-13: 10 mg via ORAL
  Filled 2015-06-13: qty 1

## 2015-06-13 MED ORDER — IBUPROFEN 800 MG PO TABS: 800.0000 mg | ORAL_TABLET | Freq: Three times a day (TID) | ORAL | Status: DC

## 2015-06-13 MED ORDER — OXYCODONE-ACETAMINOPHEN 5-325 MG PO TABS
1.0000 | ORAL_TABLET | Freq: Once | ORAL | Status: AC
  Administered 2015-06-13: 1 via ORAL
  Filled 2015-06-13: qty 1

## 2015-06-13 NOTE — ED Notes (Signed)
Pt c/o gneralized abd pain, pt reports RUQ pain, pt reports chronic abd pain, pain worsening x 5 days ago, pt seen here yesterday, pt sees & GI MD & was told to come here for further eval, pt denies v/d, pt c/o nausea, pt A&O x4, pt hx of IBS

## 2015-06-13 NOTE — Discharge Instructions (Signed)
Abdominal Pain, Adult Many things can cause abdominal pain. Usually, abdominal pain is not caused by a disease and will improve without treatment. It can often be observed and treated at home. Your health care provider will do a physical exam and possibly order blood tests and X-rays to help determine the seriousness of your pain. However, in many cases, more time must pass before a clear cause of the pain can be found. Before that point, your health care provider may not know if you need more testing or further treatment. HOME CARE INSTRUCTIONS Monitor your abdominal pain for any changes. The following actions may help to alleviate any discomfort you are experiencing:  Only take over-the-counter or prescription medicines as directed by your health care provider.  Do not take laxatives unless directed to do so by your health care provider.  Try a clear liquid diet (broth, tea, or water) as directed by your health care provider. Slowly move to a bland diet as tolerated. SEEK MEDICAL CARE IF:  You have unexplained abdominal pain.  You have abdominal pain associated with nausea or diarrhea.  You have pain when you urinate or have a bowel movement.  You experience abdominal pain that wakes you in the night.  You have abdominal pain that is worsened or improved by eating food.  You have abdominal pain that is worsened with eating fatty foods.  You have a fever. SEEK IMMEDIATE MEDICAL CARE IF:  Your pain does not go away within 2 hours.  You keep throwing up (vomiting).  Your pain is felt only in portions of the abdomen, such as the right side or the left lower portion of the abdomen.  You pass bloody or black tarry stools. MAKE SURE YOU:  Understand these instructions.  Will watch your condition.  Will get help right away if you are not doing well or get worse.   This information is not intended to replace advice given to you by your health care provider. Make sure you discuss  any questions you have with your health care provider.   Document Released: 04/02/2005 Document Revised: 03/14/2015 Document Reviewed: 03/02/2013 Elsevier Interactive Patient Education 2016 [employer]  Pain Without a Known Cause WHAT IS PAIN WITHOUT A KNOWN CAUSE? Pain can occur in any part of the body and can range from mild to severe. Sometimes no cause can be found for why you are having pain. Some types of pain that can occur without a known cause include:   Headache.  Back pain.  Abdominal pain.  Neck pain. HOW IS PAIN WITHOUT A KNOWN CAUSE DIAGNOSED?  Your health care provider will try to find the cause of your pain. This may include:  Physical exam.  Medical history.  Blood tests.  Urine tests.  X-rays. If no cause is found, your health care provider may diagnose you with pain without a known cause.  IS THERE TREATMENT FOR PAIN WITHOUT A CAUSE?  Treatment depends on the kind of pain you have. Your health care provider may prescribe medicines to help relieve your pain.  WHAT CAN I DO AT HOME FOR MY PAIN?   Take medicines only as directed by your health care provider.  Stop any activities that cause pain. During periods of severe pain, bed rest may help.  Try to reduce your stress with activities such as yoga or meditation. Talk to your health care provider for other stress-reducing activity recommendations.  Exercise regularly, if approved by your health care provider.  Eat a healthy  diet that includes fruits and vegetables. This may improve pain. Talk to your health care provider if you have any questions about your diet. WHAT IF MY PAIN DOES NOT GET BETTER?  If you have a painful condition and no reason can be found for the pain or the pain gets worse, it is important to follow up with your health care provider. It may be necessary to repeat tests and look further for a possible cause.    This information is not intended to replace advice given to you by your  health care provider. Make sure you discuss any questions you have with your health care provider.   Follow up with your PCP provider for further evaluation. Pain management referral given. Return to the ED if you expereicen persistent vomiting, blood in your vomit, fever, loss of consciousness, chest pain or shortness of breath.

## 2015-06-13 NOTE — Telephone Encounter (Signed)
Returned pt call. LVM to return call 

## 2015-06-13 NOTE — ED Provider Notes (Signed)
CSN: [id number]     Arrival date & time 06/13/15  0847 History   First MD Initiated Contact with Patient 06/13/15 1003     Chief Complaint  Patient presents with  . Abdominal Pain     (Consider location/radiation/quality/duration/timing/severity/associated sxs/prior Treatment) HPI  Levi Palmer is a 29 y.o M with a pmhx of chronic abdominal pain, candidal esophagitis, cirrhosis, IBS who presents to the emergency department today complaining of right upper quadrant abdominal pain. Patient has had chronic daily right upper quadrant abdominal pain over the last 7 years. However, patient states that over the last 5 days this pain has become significantly worse and is intolerable. Patient was seen in the emergency department yesterday for same and had a CT of his abdomen performed which was unremarkable. Patient was discharged home with GI follow-up. Patient states that when he went home his pain did not improve and he was unable to get in touch with his primary care provider so he returned to the emergency department today. Patient states that he has associated diarrhea and vomiting. These symptoms have also been present for many years. Patient is a current marijuana user. Patient states that he refuses to use outpatient pain management because they require that he stop using marijuana. Denies fever, chills, melena, hematochezia, back pain, dysuria.  Past Medical History  Diagnosis Date  . Medical history non-contributory   . Anxiety   . Panic attack as reaction to stress   . Anemia   . Depression   . Gallstones   . Dysrhythmia     irregular heart rate - due to zoloft  . Bipolar disorder (HCC)   . Cirrhosis (HCC)   . Constipation    Past Surgical History  Procedure Laterality Date  . Cholecystectomy    . Tonsillectomy    . Incise and drain abcess    . Mouth surgery    . Esophagogastroduodenoscopy N/A 05/22/2015    Procedure: ESOPHAGOGASTRODUODENOSCOPY (EGD);  Surgeon: Vida Rigger,  MD;  Location: [facility] ENDOSCOPY;  Service: Endoscopy;  Laterality: N/A;   Family History  Problem Relation Age of Onset  . Irritable bowel syndrome Mother   . Kidney disease Mother   . Ulcerative colitis Maternal Grandfather   . Colon cancer Neg Hx   . Colon polyps Neg Hx   . Gallbladder disease Neg Hx   . Esophageal cancer Neg Hx   . Diabetes Neg Hx    Social History  Substance Use Topics  . Smoking status: Never Smoker   . Smokeless tobacco: Never Used  . Alcohol Use: No    Review of Systems  All other systems reviewed and are negative.     Allergies  Tramadol and Zoloft  Home Medications   Prior to Admission medications   Medication Sig Start Date End Date Taking? Authorizing Provider  acetaminophen-codeine (TYLENOL #3) 300-30 MG tablet Take 1 tablet by mouth every 6 (six) hours as needed for moderate pain. 05/24/15  Yes Josalyn Funches, MD  clidinium-chlordiazePOXIDE (LIBRAX) 5-2.5 MG capsule Take 1 capsule by mouth daily.   Yes Historical Provider, MD  clonazePAM (KLONOPIN) 0.5 MG tablet Take 1 tablet (0.5 mg total) by mouth 2 (two) times daily. 05/29/15  Yes Josalyn Funches, MD  diphenhydrAMINE (BENADRYL) 25 mg capsule Take 50 mg by mouth at bedtime as needed for sleep.   Yes Historical Provider, MD  fluconazole (DIFLUCAN) 200 MG tablet Take 1 tablet (200 mg total) by mouth daily. 400 mg on day one, then 200 mg daily  for 14-21 days 05/24/15  Yes Dessa Phi, MD  Linaclotide (LINZESS) 145 MCG CAPS capsule Take 2 capsules (290 mcg total) by mouth daily. Dr. Ewing Schlein 05/29/15  Yes Dessa Phi, MD  metoCLOPramide (REGLAN) 10 MG tablet Take 1 tablet (10 mg total) by mouth daily as needed for nausea. 05/10/15  Yes Azalia Bilis, MD  omeprazole (PRILOSEC) 20 MG capsule Take 1 capsule (20 mg total) by mouth daily. For 2 weeks, then as needed 05/10/15  Yes Azalia Bilis, MD  promethazine (PHENERGAN) 25 MG tablet Take 1 tablet (25 mg total) by mouth every 8 (eight) hours as needed  for nausea or vomiting. 05/29/15  Yes Josalyn Funches, MD   BP 125/88 mmHg  Pulse 104  Temp(Src) 98.1 F (36.7 C) (Oral)  Resp 20  Ht  (1.702 m)  Wt 65.772 kg  BMI 22.71 kg/m2  SpO2 100% Physical Exam  Constitutional: He is oriented to person, place, and time. He appears well-developed and well-nourished. No distress.  HENT:  Head: Normocephalic and atraumatic.  Mouth/Throat: Oropharynx is clear and moist. No oropharyngeal exudate.  Eyes: Conjunctivae and EOM are normal. Pupils are equal, round, and reactive to light. Right eye exhibits no discharge. Left eye exhibits no discharge. No scleral icterus.  Neck: Neck supple.  Cardiovascular: Normal rate, regular rhythm, normal heart sounds and intact distal pulses.  Exam reveals no gallop and no friction rub.   No murmur heard. Pulmonary/Chest: Effort normal and breath sounds normal. No respiratory distress. He has no wheezes. He has no rales. He exhibits no tenderness.  Abdominal: Soft. Normal appearance and bowel sounds are normal. He exhibits no distension and no abdominal bruit. There is tenderness. There is no rigidity, no rebound, no guarding and no tenderness at McBurney's point.    Musculoskeletal: Normal range of motion. He exhibits no edema.  Lymphadenopathy:    He has no cervical adenopathy.  Neurological: He is alert and oriented to person, place, and time. No cranial nerve deficit.  Strength 5/5 throughout. No sensory deficits.  No gait abnormality.  Skin: Skin is warm and dry. No rash noted. He is not diaphoretic. No erythema. No pallor.  Psychiatric: He has a normal mood and affect. His behavior is normal.  Nursing note and vitals reviewed.   ED Course  Procedures (including critical care time) Labs Review Labs Reviewed - No data to display  Imaging Review Ct Abdomen Pelvis W Contrast  06/12/2015  CLINICAL DATA:  Right lower quadrant pain, nausea and vomiting for 3 days EXAM: CT ABDOMEN AND PELVIS WITH  CONTRAST TECHNIQUE: Multidetector CT imaging of the abdomen and pelvis was performed using the standard protocol following bolus administration of intravenous contrast. CONTRAST:  80mL OMNIPAQUE IOHEXOL 300 MG/ML  SOLN COMPARISON:  08/07/2014 FINDINGS: Lung bases are unremarkable. The patient is status post cholecystectomy. There is indeterminate low-density lesion in right hepatic lobe measures 4 mm. The pancreas, spleen and adrenal glands are unremarkable. Sagittal images of the spine are unremarkable. Abdominal aorta is unremarkable. Mild tortuous splenic veins are noted in splenic hilum. Kidneys are symmetrical in size and enhancement. No hydronephrosis or hydroureter. No aortic aneurysm. There is some fluid and gas in distal small bowel loops without significant small bowel distension. Mild distal enteritis cannot be excluded. There is no transition point in caliber of small bowel. No pericecal inflammation. The right colon is decompressed. Normal appendix is clearly visualized in coronal image 37. No colitis or diverticulitis. The left colon sigmoid colon and rectum are empty  collapsed. Prostate gland and seminal vesicles are unremarkable. The urinary bladder is unremarkable. No inguinal adenopathy. IMPRESSION: 1. There is no evidence of acute inflammatory process within abdomen. 2. Subtle low-density lesion in right hepatic lobe measures 4 mm too small to be characterized. 3. No hydronephrosis or hydroureter. 4. Status postcholecystectomy. 5. There is some fluid in nondistended distal small bowel loops. Mild enteritis cannot be excluded. No small bowel obstruction. 6. No pericecal inflammation.  Normal appendix. 7. No colitis or diverticulitis. Limited assessment of the colon which is empty collapsed. Electronically Signed   By: Natasha MeadLiviu  Pop M.D.   On: 06/12/2015 12:08   I have personally reviewed and evaluated these images and lab results as part of my medical decision-making.   EKG Interpretation None        MDM   Final diagnoses:  Right upper quadrant pain   29 y.o M with pmhx of chronic abdominal pain presents for RUQ abdominal pain. Of note, patient has a surgical history of a cholecystectomy. Patient was seen in the emergency department by Dr. Donnald GarrePfeiffer yesterday and received a full workup including labs and CT abdomen pelvis. All results were unremarkable and patient was discharged home with appropriate follow-up. Patient sees gastroenterology for his long-term chronic abdominal pain. Patient received recent EGD which revealed Candida esophagitis which she is currently being treated for with fluconazole. Patient is scheduled to have a gastric emptying study on December 16. He also has a primary care provider who prescribes him Klonopin, which she states helps his pain. Patient refuses to go to pain management as this will require him to stop smoking marijuana. Discussed with patient that this likely a acute exacerbation of his chronic abdominal pain and for him to have proper management of his pain as an outpatient he would need to see pain management. Discussed the patient we will not give him narcotics to go home on today. Patient is afebrile, nontoxic appearing. All vital signs are stable. Abdomen is not rigid.  Will not repeat lab work or imaging as he just had these done yesterday. Patient is nontoxic, nonseptic appearing, in no apparent distress.  Patient's pain and other symptoms adequately managed in emergency department. Patient does not meet the SIRS or Sepsis criteria.  On repeat exam patient does not have a surgical abdomin and there are no peritoneal signs.  No indication of appendicitis, bowel obstruction, bowel perforation, diverticulitis. Patient discharged home with symptomatic treatment and given strict instructions for follow-up with their primary care physician.  I have also discussed reasons to return immediately to the ER.  Patient expresses understanding and agrees with  plan.       Lester KinsmanSamantha Tripp [place], PA-C 06/13/15 2044  Zadie Rhineonald Wickline, MD 06/14/15 [phone]

## 2015-06-13 NOTE — Telephone Encounter (Addendum)
Called back to patient He reports acute onset of RLQ pain radiating to testes x 5 days ago Pain with walking Went to ED, CT abdomen and UA normal. Pain persist  Plan, work patient in tomorrow AM at 9 AM for testicular, R hip and abdominal exam and repeat UA and urine cytology   Keep BZ the same with klonopin  Likely add muscle relaxer  Patient will be worked in at tomorrow AM, he knows he is being worked in and may have to wait a while. He agrees with plan and voices understanding.

## 2015-06-13 NOTE — Telephone Encounter (Signed)
Pt returned call  Was at ER early with extreme pain, medication not helping  Notified pt, Tylenol #3 was given #120 on 05/24/2015 and by law we can refill till 06/23/2015 Rx Klonopin #60 with 2 refills was given on 05/29/2015 Pt was upset, using loud voice and insulting words, saying he was about to give up and no one care. Requesting  Rx ativan since it works better for him.  Pt stated had medication at ER and vomited     .

## 2015-06-14 ENCOUNTER — Encounter: Payer: Self-pay | Admitting: Family Medicine

## 2015-06-14 ENCOUNTER — Ambulatory Visit: Payer: MEDICAID | Attending: Family Medicine | Admitting: Family Medicine

## 2015-06-14 VITALS — BP 104/72 | HR 100 | Temp 97.8°F | Resp 16 | Ht 67.0 in | Wt 138.0 lb

## 2015-06-14 DIAGNOSIS — R1011 Right upper quadrant pain: Secondary | ICD-10-CM | POA: Insufficient documentation

## 2015-06-14 DIAGNOSIS — G8929 Other chronic pain: Secondary | ICD-10-CM | POA: Insufficient documentation

## 2015-06-14 DIAGNOSIS — Z9049 Acquired absence of other specified parts of digestive tract: Secondary | ICD-10-CM | POA: Insufficient documentation

## 2015-06-14 DIAGNOSIS — Z79899 Other long term (current) drug therapy: Secondary | ICD-10-CM | POA: Insufficient documentation

## 2015-06-14 DIAGNOSIS — R109 Unspecified abdominal pain: Secondary | ICD-10-CM

## 2015-06-14 LAB — POCT URINALYSIS DIPSTICK
Glucose, UA: NEGATIVE
LEUKOCYTES UA: NEGATIVE
Nitrite, UA: NEGATIVE
PH UA: 6
Protein, UA: 30
Spec Grav, UA: >=1.03
Urobilinogen, UA: 1

## 2015-06-14 MED ORDER — METHOCARBAMOL 500 MG PO TABS: 500.0000 mg | ORAL_TABLET | Freq: Three times a day (TID) | ORAL | Status: DC | PRN

## 2015-06-14 MED ORDER — ACETAMINOPHEN-CODEINE #3 300-30 MG PO TABS: 1.0000 | ORAL_TABLET | Freq: Four times a day (QID) | ORAL | Status: DC | PRN

## 2015-06-14 NOTE — Assessment & Plan Note (Signed)
Your repeat UA is normal Your pain is suggestive of muscle pain. Adding robaxin, this is a muscle relaxer  Provided tylenol #3 script, it can be filled on or after 06/23/15.  Continue phenergan as needed Stay hydrated as dehydration will worsen musculoskeletal pain.

## 2015-06-14 NOTE — Patient Instructions (Addendum)
Levi Palmer was seen today for abdominal pain.  Diagnoses and all orders for this visit:  RUQ pain -     POCT urinalysis dipstick -     methocarbamol (ROBAXIN) 500 MG tablet; Take 1 tablet (500 mg total) by mouth every 8 (eight) hours as needed for muscle spasms.  Chronic abdominal pain -     acetaminophen-codeine (TYLENOL #3) 300-30 MG tablet; Take 1 tablet by mouth every 6 (six) hours as needed for moderate pain. Refill on or after 06/23/2015    Your repeat UA is normal Your pain is suggestive of muscle pain. Adding robaxin, this is a muscle relaxer  Provided tylenol #3 script, it can be filled on or after 06/23/15.  Continue phenergan as needed Stay hydrated as dehydration will worsen musculoskeletal pain.   F/u in 4 weeks for RUQ pain   Dr. Armen PickupFunches

## 2015-06-14 NOTE — Progress Notes (Signed)
F/U rt abdominal pain radiating to chest  Pain scale #10 No tobacco user  No suicidal thought in the past two weeks

## 2015-06-14 NOTE — Progress Notes (Signed)
Patient ID: Levi Palmer, male   DOB: 08/05/1985, 29 y.o.   MRN: [id number]   Subjective:  Patient ID: Levi Palmer, male    DOB: 08/05/1985  Age: 29 y.o. MRN: [id number]  CC: Abdominal Pain   HPI Levi Palmer presents for    1. RUQ pain: x 6 days. Acute onset while sitting in bed rolling a marijuana cigarette. The pains is a twisting sensation.  Worsening. Has been to ED x 2. CT abdomen negative. UA negative.  RUQ pain initially radiated to R testicle. Now radiating to R side of chest. Still radiates to R testicle when he walks.  Patient has hx of chronic abdominal pain, anxiety, recent candida esophagitis diagnosed on EGD. He is s/p cholecystectomy many years ago. He denies ETOH.   Social History  Substance Use Topics  . Smoking status: Never Smoker   . Smokeless tobacco: Never Used  . Alcohol Use: No   Past Surgical History  Procedure Laterality Date  . Cholecystectomy    . Tonsillectomy    . Incise and drain abcess    . Mouth surgery    . Esophagogastroduodenoscopy N/A 05/22/2015    Procedure: ESOPHAGOGASTRODUODENOSCOPY (EGD);  Surgeon: Vida RiggerMarc Magod, MD;  [facility];  Service: Endoscopy;  Laterality: N/A;    Outpatient Prescriptions Prior to Visit  Medication Sig Dispense Refill  . acetaminophen-codeine (TYLENOL #3) 300-30 MG tablet Take 1 tablet by mouth every 6 (six) hours as needed for moderate pain. 120 tablet 0  . clidinium-chlordiazePOXIDE (LIBRAX) 5-2.5 MG capsule Take 1 capsule by mouth daily.    . clonazePAM (KLONOPIN) 0.5 MG tablet Take 1 tablet (0.5 mg total) by mouth 2 (two) times daily. 60 tablet 2  . diphenhydrAMINE (BENADRYL) 25 mg capsule Take 50 mg by mouth at bedtime as needed for sleep.    . fluconazole (DIFLUCAN) 200 MG tablet Take 1 tablet (200 mg total) by mouth daily. 400 mg on day one, then 200 mg daily for 14-21 days 22 tablet 0  . ibuprofen (ADVIL,MOTRIN) 800 MG tablet Take 1 tablet (800 mg total) by mouth 3 (three) times  daily. 21 tablet 0  . Linaclotide (LINZESS) 145 MCG CAPS capsule Take 2 capsules (290 mcg total) by mouth daily. Dr. Ewing SchleinMagod 90 capsule 3  . metoCLOPramide (REGLAN) 10 MG tablet Take 1 tablet (10 mg total) by mouth daily as needed for nausea. 15 tablet 1  . omeprazole (PRILOSEC) 20 MG capsule Take 1 capsule (20 mg total) by mouth daily. For 2 weeks, then as needed 30 capsule 0  . promethazine (PHENERGAN) 25 MG tablet Take 1 tablet (25 mg total) by mouth every 8 (eight) hours as needed for nausea or vomiting. 60 tablet 2   No facility-administered medications prior to visit.    ROS Review of Systems  Constitutional: Negative for fever, chills, fatigue and unexpected weight change.  Eyes: Negative for visual disturbance.  Respiratory: Negative for cough and shortness of breath.   Cardiovascular: Positive for chest pain. Negative for palpitations and leg swelling.  Gastrointestinal: Positive for nausea, vomiting and abdominal pain. Negative for diarrhea, constipation and blood in stool.  Endocrine: Negative for polydipsia, polyphagia and polyuria.  Genitourinary: Positive for flank pain (R sided ) and testicular pain. Negative for dysuria, urgency, frequency, hematuria, decreased urine volume, discharge, penile swelling, scrotal swelling, genital sores and penile pain.  Musculoskeletal: Negative for myalgias, back pain, arthralgias, gait problem and neck pain.  Skin: Negative for rash.  Allergic/Immunologic: Negative for immunocompromised  state.  Hematological: Negative for adenopathy. Does not bruise/bleed easily.  Psychiatric/Behavioral: Negative for suicidal ideas, sleep disturbance and dysphoric mood. The patient is not nervous/anxious.     Objective:  BP 104/72 mmHg  Pulse 100  Temp(Src) 97.8 F (36.6 C) (Oral)  Resp 16  Ht  (1.702 m)  Wt 138 lb (62.596 kg)  BMI 21.61 kg/m2  SpO2 100%  Pulse Readings from Last 3 Encounters:  06/14/15 100  06/13/15 90  06/12/15 87     BP/Weight 06/14/2015 06/13/2015 06/12/2015  Systolic BP 104 139 116  Diastolic BP 72 85 68  Wt. (Lbs) 138 145 145  BMI 21.61 22.71 22.71   Physical Exam  Constitutional: He appears well-developed and well-nourished. No distress.  HENT:  Head: Normocephalic and atraumatic.  Neck: Normal range of motion. Neck supple.  Cardiovascular: Normal rate, regular rhythm, normal heart sounds and intact distal pulses.   Pulmonary/Chest: Effort normal and breath sounds normal.  Abdominal: Soft. Bowel sounds are normal. He exhibits no distension and no mass. There is hepatosplenomegaly. There is no splenomegaly or hepatomegaly. There is tenderness in the right upper quadrant. There is no rebound, no guarding and no CVA tenderness. No hernia. Hernia confirmed negative in the ventral area, confirmed negative in the right inguinal area and confirmed negative in the left inguinal area.  Genitourinary: Testes normal and penis normal. Right testis shows no mass and no swelling. Left testis shows no mass and no swelling. Uncircumcised.  Musculoskeletal: He exhibits no edema.  Neurological: He is alert.  Skin: Skin is warm and dry. No rash noted. No erythema.  Psychiatric: He has a normal mood and affect.   Assessment & Plan:   Problem List Items Addressed This Visit    Chronic abdominal pain (Chronic)   Relevant Medications   methocarbamol (ROBAXIN) 500 MG tablet   acetaminophen-codeine (TYLENOL #3) 300-30 MG tablet   RUQ pain - Primary   Relevant Medications   methocarbamol (ROBAXIN) 500 MG tablet   Other Relevant Orders   POCT urinalysis dipstick (Completed)      No orders of the defined types were placed in this encounter.    Follow-up: No Follow-up on file.   Dessa Phi MD

## 2015-06-19 ENCOUNTER — Encounter (HOSPITAL_COMMUNITY): Payer: Self-pay | Admitting: Emergency Medicine

## 2015-06-19 ENCOUNTER — Emergency Department (HOSPITAL_COMMUNITY)
Admission: EM | Admit: 2015-06-19 | Discharge: 2015-06-19 | Disposition: A | Discharge status: Home / Self Care | Payer: Self-pay | Attending: Emergency Medicine | Admitting: Emergency Medicine

## 2015-06-19 ENCOUNTER — Emergency Department (HOSPITAL_COMMUNITY): Payer: Self-pay

## 2015-06-19 DIAGNOSIS — F410 Panic disorder [episodic paroxysmal anxiety] without agoraphobia: Secondary | ICD-10-CM | POA: Insufficient documentation

## 2015-06-19 DIAGNOSIS — F319 Bipolar disorder, unspecified: Secondary | ICD-10-CM | POA: Insufficient documentation

## 2015-06-19 DIAGNOSIS — Z862 Personal history of diseases of the blood and blood-forming organs and certain disorders involving the immune mechanism: Secondary | ICD-10-CM | POA: Insufficient documentation

## 2015-06-19 DIAGNOSIS — Z9049 Acquired absence of other specified parts of digestive tract: Secondary | ICD-10-CM | POA: Insufficient documentation

## 2015-06-19 DIAGNOSIS — F1210 Cannabis abuse, uncomplicated: Secondary | ICD-10-CM | POA: Insufficient documentation

## 2015-06-19 DIAGNOSIS — R1084 Generalized abdominal pain: Secondary | ICD-10-CM | POA: Insufficient documentation

## 2015-06-19 DIAGNOSIS — Z8719 Personal history of other diseases of the digestive system: Secondary | ICD-10-CM | POA: Insufficient documentation

## 2015-06-19 DIAGNOSIS — Z8679 Personal history of other diseases of the circulatory system: Secondary | ICD-10-CM | POA: Insufficient documentation

## 2015-06-19 DIAGNOSIS — R109 Unspecified abdominal pain: Secondary | ICD-10-CM

## 2015-06-19 DIAGNOSIS — G8929 Other chronic pain: Secondary | ICD-10-CM | POA: Insufficient documentation

## 2015-06-19 DIAGNOSIS — F1310 Sedative, hypnotic or anxiolytic abuse, uncomplicated: Secondary | ICD-10-CM | POA: Insufficient documentation

## 2015-06-19 DIAGNOSIS — G43A0 Cyclical vomiting, not intractable: Secondary | ICD-10-CM | POA: Insufficient documentation

## 2015-06-19 DIAGNOSIS — R1115 Cyclical vomiting syndrome unrelated to migraine: Secondary | ICD-10-CM

## 2015-06-19 DIAGNOSIS — Z79899 Other long term (current) drug therapy: Secondary | ICD-10-CM | POA: Insufficient documentation

## 2015-06-19 LAB — RAPID URINE DRUG SCREEN, HOSP PERFORMED
AMPHETAMINES: NOT DETECTED
BENZODIAZEPINES: POSITIVE — AB
Barbiturates: NOT DETECTED
COCAINE: NOT DETECTED
OPIATES: NOT DETECTED
Tetrahydrocannabinol: POSITIVE — AB

## 2015-06-19 LAB — COMPREHENSIVE METABOLIC PANEL
ALT: 10 U/L — ABNORMAL LOW (ref 17–63)
ANION GAP: 6 (ref 5–15)
AST: 17 U/L (ref 15–41)
Albumin: 4.5 g/dL (ref 3.5–5.0)
Alkaline Phosphatase: 64 U/L (ref 38–126)
BILIRUBIN TOTAL: 0.4 mg/dL (ref 0.3–1.2)
BUN: 17 mg/dL (ref 6–20)
CHLORIDE: 100 mmol/L — AB (ref 101–111)
CO2: 26 mmol/L (ref 22–32)
Calcium: 9.1 mg/dL (ref 8.9–10.3)
Creatinine, Ser: 0.97 mg/dL (ref 0.61–1.24)
GFR calc Af Amer: >60 mL/min (ref >60)
Glucose, Bld: 103 mg/dL — ABNORMAL HIGH (ref 65–99)
POTASSIUM: 3.5 mmol/L (ref 3.5–5.1)
Sodium: 132 mmol/L — ABNORMAL LOW (ref 135–145)
TOTAL PROTEIN: 8 g/dL (ref 6.5–8.1)

## 2015-06-19 LAB — URINE MICROSCOPIC-ADD ON

## 2015-06-19 LAB — LIPASE, BLOOD: LIPASE: 23 U/L (ref 11–51)

## 2015-06-19 LAB — CBC WITH DIFFERENTIAL/PLATELET
Basophils Absolute: 0 10*3/uL (ref 0.0–0.1)
Basophils Relative: 0 %
EOS ABS: 0.1 10*3/uL (ref 0.0–0.7)
Eosinophils Relative: 1 %
HCT: 37.3 % — ABNORMAL LOW (ref 39.0–52.0)
Hemoglobin: 12.1 g/dL — ABNORMAL LOW (ref 13.0–17.0)
LYMPHS ABS: 2.1 10*3/uL (ref 0.7–4.0)
LYMPHS PCT: 21 %
MCH: 25.3 pg — ABNORMAL LOW (ref 26.0–34.0)
MCHC: 32.4 g/dL (ref 30.0–36.0)
MCV: 77.9 fL — AB (ref 78.0–100.0)
Monocytes Absolute: 0.5 10*3/uL (ref 0.1–1.0)
Monocytes Relative: 5 %
NEUTROS PCT: 73 %
Neutro Abs: 7.4 10*3/uL (ref 1.7–7.7)
PLATELETS: 293 10*3/uL (ref 150–400)
RBC: 4.79 MIL/uL (ref 4.22–5.81)
RDW: 15.9 % — AB (ref 11.5–15.5)
WBC: 10.2 10*3/uL (ref 4.0–10.5)

## 2015-06-19 LAB — URINALYSIS, ROUTINE W REFLEX MICROSCOPIC
GLUCOSE, UA: NEGATIVE mg/dL
Hgb urine dipstick: NEGATIVE
KETONES UR: NEGATIVE mg/dL
LEUKOCYTES UA: NEGATIVE
NITRITE: NEGATIVE
PROTEIN: 30 mg/dL — AB
Specific Gravity, Urine: 1.027 (ref 1.005–1.030)
pH: 6 (ref 5.0–8.0)

## 2015-06-19 MED ORDER — GI COCKTAIL ~~LOC~~
30.0000 mL | Freq: Once | ORAL | Status: AC
  Administered 2015-06-19: 30 mL via ORAL
  Filled 2015-06-19: qty 30

## 2015-06-19 MED ORDER — SODIUM CHLORIDE 0.9 % IV BOLUS (SEPSIS)
1000.0000 mL | Freq: Once | INTRAVENOUS | Status: AC
  Administered 2015-06-19: 1000 mL via INTRAVENOUS

## 2015-06-19 MED ORDER — METOCLOPRAMIDE HCL 5 MG/ML IJ SOLN
10.0000 mg | Freq: Once | INTRAMUSCULAR | Status: AC
  Administered 2015-06-19: 10 mg via INTRAVENOUS
  Filled 2015-06-19: qty 2

## 2015-06-19 MED ORDER — KETOROLAC TROMETHAMINE 30 MG/ML IJ SOLN
30.0000 mg | Freq: Once | INTRAMUSCULAR | Status: AC
  Administered 2015-06-19: 30 mg via INTRAVENOUS
  Filled 2015-06-19: qty 1

## 2015-06-19 MED ORDER — HALOPERIDOL LACTATE 5 MG/ML IJ SOLN
2.0000 mg | Freq: Once | INTRAMUSCULAR | Status: AC
  Administered 2015-06-19: 2 mg via INTRAVENOUS
  Filled 2015-06-19: qty 1

## 2015-06-19 NOTE — ED Notes (Addendum)
Pt reports abdominal pain and emesis x10 days. Increased N/V and decreased PO intake x2 days. Had endoscopy last month with no abnormal results besides fungal infection in throat from antibiotics taken for abdominal pain (?). Pt appears pale. Guarding abdomen. Denies blood in vomit but says he has seen blood in stool. On medication for constipation. No issues with normal BM but says he has increased nausea with BM. No other c/c. No active vomiting in triage.

## 2015-06-19 NOTE — Discharge Instructions (Signed)
Continue anti emetics at home. Follow up with your primary care doctor and gastroenterologist as soon as able.   Nausea and Vomiting Nausea is a sick feeling that often comes before throwing up (vomiting). Vomiting is a reflex where stomach contents come out of your mouth. Vomiting can cause severe loss of body fluids (dehydration). Children and elderly adults can become dehydrated quickly, especially if they also have diarrhea. Nausea and vomiting are symptoms of a condition or disease. It is important to find the cause of your symptoms. CAUSES   Direct irritation of the stomach lining. This irritation can result from increased acid production (gastroesophageal reflux disease), infection, food poisoning, taking certain medicines (such as nonsteroidal anti-inflammatory drugs), alcohol use, or tobacco use.  Signals from the brain.These signals could be caused by a headache, heat exposure, an inner ear disturbance, increased pressure in the brain from injury, infection, a tumor, or a concussion, pain, emotional stimulus, or metabolic problems.  An obstruction in the gastrointestinal tract (bowel obstruction).  Illnesses such as diabetes, hepatitis, gallbladder problems, appendicitis, kidney problems, cancer, sepsis, atypical symptoms of a heart attack, or eating disorders.  Medical treatments such as chemotherapy and radiation.  Receiving medicine that makes you sleep (general anesthetic) during surgery. DIAGNOSIS Your caregiver may ask for tests to be done if the problems do not improve after a few days. Tests may also be done if symptoms are severe or if the reason for the nausea and vomiting is not clear. Tests may include:  Urine tests.  Blood tests.  Stool tests.  Cultures (to look for evidence of infection).  X-rays or other imaging studies. Test results can help your caregiver make decisions about treatment or the need for additional tests. TREATMENT You need to stay well  hydrated. Drink frequently but in small amounts.You may wish to drink water, sports drinks, clear broth, or eat frozen ice pops or gelatin dessert to help stay hydrated.When you eat, eating slowly may help prevent nausea.There are also some antinausea medicines that may help prevent nausea. HOME CARE INSTRUCTIONS   Take all medicine as directed by your caregiver.  If you do not have an appetite, do not force yourself to eat. However, you must continue to drink fluids.  If you have an appetite, eat a normal diet unless your caregiver tells you differently.  Eat a variety of complex carbohydrates (rice, wheat, potatoes, bread), lean meats, yogurt, fruits, and vegetables.  Avoid high-fat foods because they are more difficult to digest.  Drink enough water and fluids to keep your urine clear or pale yellow.  If you are dehydrated, ask your caregiver for specific rehydration instructions. Signs of dehydration may include:  Severe thirst.  Dry lips and mouth.  Dizziness.  Dark urine.  Decreasing urine frequency and amount.  Confusion.  Rapid breathing or pulse. SEEK IMMEDIATE MEDICAL CARE IF:   You have blood or brown flecks (like coffee grounds) in your vomit.  You have black or bloody stools.  You have a severe headache or stiff neck.  You are confused.  You have severe abdominal pain.  You have chest pain or trouble breathing.  You do not urinate at least once every 8 hours.  You develop cold or clammy skin.  You continue to vomit for longer than 24 to 48 hours.  You have a fever. MAKE SURE YOU:   Understand these instructions.  Will watch your condition.  Will get help right away if you are not doing well or get worse.  This information is not intended to replace advice given to you by your health care provider. Make sure you discuss any questions you have with your health care provider.   Document Released: 06/23/2005 Document Revised: 09/15/2011  Document Reviewed: 11/20/2010 Elsevier Interactive Patient Education [employer]

## 2015-06-19 NOTE — ED Provider Notes (Signed)
CSN: [id number]     Arrival date & time 06/19/15  0747 History   First MD Initiated Contact with Patient 06/19/15 0801     Chief Complaint  Patient presents with  . Abdominal Pain  . Emesis     (Consider location/radiation/quality/duration/timing/severity/associated sxs/prior Treatment) HPI Levi Palmer is a 29 y.o. male who presents to emergency department complaining of abdominal pain, nausea, vomiting. Patient reports chronic abdominal pain that he has had daily for the last 7 years. He states pain worsened in the last 3 weeks. He states he is unable to keep anything down in the last 2 days. He was seen a week ago for the same in the ER 2 days in a row, had a full workup including blood work and CT abdomen and pelvis which did not show any abnormalities. He was also followed up with his primary care doctor who believed pain could be musculoskeletal and he is currently taking Tylenol 3 and Robaxin which she states is not helping. Patient also followed by gastroenterology, recent endoscopy showed Candida esophagitis, for which he is currently on Diflucan. Patient is also taking one Linzess for his IBS, Phenergan for nausea, Klonopin to help with his nausea as well. Patient denies any diarrhea. Denies any blood in his emesis. No fever or chills. States pain is similar to his chronic pain only worse. He also admits to daily marijuana use. He was referred to the outpatient pain management clinic, however he has refused because he does not want to stop his marijuana use.  Past Medical History  Diagnosis Date  . Medical history non-contributory   . Anxiety   . Panic attack as reaction to stress   . Anemia   . Depression   . Gallstones   . Dysrhythmia     irregular heart rate - due to zoloft  . Bipolar disorder (HCC)   . Cirrhosis (HCC)   . Constipation    Past Surgical History  Procedure Laterality Date  . Cholecystectomy    . Tonsillectomy    . Incise and drain abcess    . Mouth  surgery    . Esophagogastroduodenoscopy N/A 05/22/2015    Procedure: ESOPHAGOGASTRODUODENOSCOPY (EGD);  Surgeon: Vida Rigger, MD;  Location: [facility] ENDOSCOPY;  Service: Endoscopy;  Laterality: N/A;   Family History  Problem Relation Age of Onset  . Irritable bowel syndrome Mother   . Kidney disease Mother   . Ulcerative colitis Maternal Grandfather   . Colon cancer Neg Hx   . Colon polyps Neg Hx   . Gallbladder disease Neg Hx   . Esophageal cancer Neg Hx   . Diabetes Neg Hx    Social History  Substance Use Topics  . Smoking status: Never Smoker   . Smokeless tobacco: Never Used  . Alcohol Use: No    Review of Systems  Constitutional: Negative for fever and chills.  Respiratory: Negative for cough, chest tightness and shortness of breath.   Cardiovascular: Negative for chest pain, palpitations and leg swelling.  Gastrointestinal: Positive for nausea, vomiting and abdominal pain. Negative for diarrhea, blood in stool and abdominal distention.  Genitourinary: Negative for dysuria, urgency, frequency and hematuria.  Musculoskeletal: Negative for myalgias, arthralgias, neck pain and neck stiffness.  Skin: Negative for rash.  Allergic/Immunologic: Negative for immunocompromised state.  Neurological: Negative for dizziness, weakness, light-headedness, numbness and headaches.  All other systems reviewed and are negative.     Allergies  Tramadol and Zoloft  Home Medications   Prior to Admission  medications   Medication Sig Start Date End Date Taking? Authorizing Provider  acetaminophen-codeine (TYLENOL #3) 300-30 MG tablet Take 1 tablet by mouth every 6 (six) hours as needed for moderate pain. Refill on or after 06/23/2015 06/14/15   Dessa PhiJosalyn Funches, MD  clidinium-chlordiazePOXIDE (LIBRAX) 5-2.5 MG capsule Take 1 capsule by mouth daily.    Historical Provider, MD  clonazePAM (KLONOPIN) 0.5 MG tablet Take 1 tablet (0.5 mg total) by mouth 2 (two) times daily. 05/29/15   Josalyn Funches,  MD  diphenhydrAMINE (BENADRYL) 25 mg capsule Take 50 mg by mouth at bedtime as needed for sleep.    Historical Provider, MD  fluconazole (DIFLUCAN) 200 MG tablet Take 1 tablet (200 mg total) by mouth daily. 400 mg on day one, then 200 mg daily for 14-21 days 05/24/15   Dessa PhiJosalyn Funches, MD  ibuprofen (ADVIL,MOTRIN) 800 MG tablet Take 1 tablet (800 mg total) by mouth 3 (three) times daily. 06/13/15   Samantha Tripp Dowless, PA-C  Linaclotide (LINZESS) 145 MCG CAPS capsule Take 2 capsules (290 mcg total) by mouth daily. Dr. Ewing SchleinMagod 05/29/15   Dessa PhiJosalyn Funches, MD  methocarbamol (ROBAXIN) 500 MG tablet Take 1 tablet (500 mg total) by mouth every 8 (eight) hours as needed for muscle spasms. 06/14/15   Josalyn Funches, MD  metoCLOPramide (REGLAN) 10 MG tablet Take 1 tablet (10 mg total) by mouth daily as needed for nausea. 05/10/15   Azalia BilisKevin Campos, MD  omeprazole (PRILOSEC) 20 MG capsule Take 1 capsule (20 mg total) by mouth daily. For 2 weeks, then as needed 05/10/15   Azalia BilisKevin Campos, MD  promethazine (PHENERGAN) 25 MG tablet Take 1 tablet (25 mg total) by mouth every 8 (eight) hours as needed for nausea or vomiting. 05/29/15   Josalyn Funches, MD   BP 117/79 mmHg  Pulse 97  Temp(Src) 97.5 F (36.4 C) (Oral)  Resp 15  SpO2 96% Physical Exam  Constitutional: He appears well-developed and well-nourished. No distress.  HENT:  Head: Normocephalic and atraumatic.  Eyes: Conjunctivae are normal.  Neck: Neck supple.  Cardiovascular: Normal rate, regular rhythm and normal heart sounds.   Pulmonary/Chest: Effort normal. No respiratory distress. He has no wheezes. He has no rales.  Abdominal: Soft. Bowel sounds are normal. He exhibits no distension. There is tenderness. There is no rebound and no guarding.  Diffuse tenderness, worse in the right upper quadrant. No guarding  Musculoskeletal: He exhibits no edema.  Neurological: He is alert.  Skin: Skin is warm and dry.  Nursing note and vitals reviewed.   ED  Course  Procedures (including critical care time) Labs Review Labs Reviewed  CBC WITH DIFFERENTIAL/PLATELET - Abnormal; Notable for the following:    Hemoglobin 12.1 (*)    HCT 37.3 (*)    MCV 77.9 (*)    MCH 25.3 (*)    RDW 15.9 (*)    All other components within normal limits  COMPREHENSIVE METABOLIC PANEL - Abnormal; Notable for the following:    Sodium 132 (*)    Chloride 100 (*)    Glucose, Bld 103 (*)    ALT 10 (*)    All other components within normal limits  URINALYSIS, ROUTINE W REFLEX MICROSCOPIC (NOT [facility]) - Abnormal; Notable for the following:    Color, Urine AMBER (*)    APPearance CLOUDY (*)    Bilirubin Urine SMALL (*)    Protein, ur 30 (*)    All other components within normal limits  URINE RAPID DRUG SCREEN, HOSP PERFORMED -  Abnormal; Notable for the following:    Benzodiazepines POSITIVE (*)    Tetrahydrocannabinol POSITIVE (*)    All other components within normal limits  URINE MICROSCOPIC-ADD ON - Abnormal; Notable for the following:    Squamous Epithelial / LPF 0-5 (*)    Bacteria, UA RARE (*)    All other components within normal limits  LIPASE, BLOOD    Imaging Review Dg Abd 2 Views  06/19/2015  CLINICAL DATA:  Mid to upper abdominal pain for approximately 10 days. EXAM: ABDOMEN - 2 VIEW COMPARISON:  Multiple prior CT scans. FINDINGS: The lung bases are clear. The abdominal bowel gas pattern is unremarkable. No findings for obstruction an or perforation. No evidence of constipation. The soft tissue shadows are maintained. No worrisome calcifications. Surgical clips are noted in the right upper quadrant or previous cholecystectomy. The bony structures are normal. IMPRESSION: Unremarkable abdominal radiographs. Electronically Signed   By: Rudie Meyer M.D.   On: 06/19/2015 09:30   I have personally reviewed and evaluated these images and lab results as part of my medical decision-making.   EKG Interpretation None      MDM   Final diagnoses:   Abdominal pain  Non-intractable cyclical vomiting with nausea    patient was worsening chronic abdominal pain. He is nontoxic-appearing. No surgical abdomen. CT scan 1 week ago unremarkable. Will get labs, abdomen x-ray, pain medicines and antiemetics ordered.  10:30 AM Patient states his nausea somewhat improved, continues to have pain in his abdomen. Labs unremarkable. X-rays negative. Vital signs are normal. He shows old records are reviewed, multiple evaluations for the same. Has gastric emptying study ordered in 3 days. Continue hydration, will try Haldol for his symptoms.  11:34 AM Patient feels much better. He is stable for discharge home. Patient has Phenergan and Reglan at home that he will continue to take. Patient to call his gastroenterologist and primary care doctor for follow-up.  Filed Vitals:   06/19/15 0752 06/19/15 1016  BP: 117/79 114/65  Pulse: 97 81  Temp: 97.5 F (36.4 C) 97.6 F (36.4 C)  TempSrc: Oral Oral  Resp: 15 16  SpO2: 96% 97%     Jaynie Crumble, PA-C 06/19/15 1556  Azalia Bilis, MD 06/19/15 [phone]

## 2015-06-20 ENCOUNTER — Ambulatory Visit (HOSPITAL_COMMUNITY): Payer: MEDICAID

## 2015-06-21 ENCOUNTER — Encounter (HOSPITAL_COMMUNITY): Payer: Self-pay | Admitting: Emergency Medicine

## 2015-06-21 ENCOUNTER — Emergency Department (HOSPITAL_COMMUNITY)
Admission: EM | Admit: 2015-06-21 | Discharge: 2015-06-21 | Disposition: A | Discharge status: Home / Self Care | Payer: Self-pay | Attending: Emergency Medicine | Admitting: Emergency Medicine

## 2015-06-21 ENCOUNTER — Telehealth: Payer: Self-pay | Admitting: Family Medicine

## 2015-06-21 DIAGNOSIS — Z791 Long term (current) use of non-steroidal anti-inflammatories (NSAID): Secondary | ICD-10-CM | POA: Insufficient documentation

## 2015-06-21 DIAGNOSIS — K0889 Other specified disorders of teeth and supporting structures: Secondary | ICD-10-CM | POA: Insufficient documentation

## 2015-06-21 DIAGNOSIS — K002 Abnormalities of size and form of teeth: Secondary | ICD-10-CM | POA: Insufficient documentation

## 2015-06-21 DIAGNOSIS — R112 Nausea with vomiting, unspecified: Secondary | ICD-10-CM | POA: Insufficient documentation

## 2015-06-21 DIAGNOSIS — F410 Panic disorder [episodic paroxysmal anxiety] without agoraphobia: Secondary | ICD-10-CM | POA: Insufficient documentation

## 2015-06-21 DIAGNOSIS — Z862 Personal history of diseases of the blood and blood-forming organs and certain disorders involving the immune mechanism: Secondary | ICD-10-CM | POA: Insufficient documentation

## 2015-06-21 DIAGNOSIS — Z8679 Personal history of other diseases of the circulatory system: Secondary | ICD-10-CM | POA: Insufficient documentation

## 2015-06-21 DIAGNOSIS — R1084 Generalized abdominal pain: Secondary | ICD-10-CM | POA: Insufficient documentation

## 2015-06-21 DIAGNOSIS — G8929 Other chronic pain: Secondary | ICD-10-CM | POA: Insufficient documentation

## 2015-06-21 DIAGNOSIS — K029 Dental caries, unspecified: Secondary | ICD-10-CM | POA: Insufficient documentation

## 2015-06-21 DIAGNOSIS — F319 Bipolar disorder, unspecified: Secondary | ICD-10-CM | POA: Insufficient documentation

## 2015-06-21 DIAGNOSIS — Z79899 Other long term (current) drug therapy: Secondary | ICD-10-CM | POA: Insufficient documentation

## 2015-06-21 DIAGNOSIS — R109 Unspecified abdominal pain: Secondary | ICD-10-CM

## 2015-06-21 LAB — CBC WITH DIFFERENTIAL/PLATELET
BASOS ABS: 0 10*3/uL (ref 0.0–0.1)
Basophils Relative: 0 %
EOS ABS: 0 10*3/uL (ref 0.0–0.7)
EOS PCT: 1 %
HCT: 36.6 % — ABNORMAL LOW (ref 39.0–52.0)
Hemoglobin: 11.7 g/dL — ABNORMAL LOW (ref 13.0–17.0)
Lymphocytes Relative: 19 %
Lymphs Abs: 1.6 10*3/uL (ref 0.7–4.0)
MCH: 24.6 pg — AB (ref 26.0–34.0)
MCHC: 32 g/dL (ref 30.0–36.0)
MCV: 77.1 fL — ABNORMAL LOW (ref 78.0–100.0)
MONO ABS: 0.5 10*3/uL (ref 0.1–1.0)
Monocytes Relative: 5 %
Neutro Abs: 6.6 10*3/uL (ref 1.7–7.7)
Neutrophils Relative %: 75 %
PLATELETS: 272 10*3/uL (ref 150–400)
RBC: 4.75 MIL/uL (ref 4.22–5.81)
RDW: 15.9 % — AB (ref 11.5–15.5)
WBC: 8.7 10*3/uL (ref 4.0–10.5)

## 2015-06-21 LAB — COMPREHENSIVE METABOLIC PANEL
ALT: 11 U/L — AB (ref 17–63)
AST: 17 U/L (ref 15–41)
Albumin: 4.2 g/dL (ref 3.5–5.0)
Alkaline Phosphatase: 65 U/L (ref 38–126)
Anion gap: 10 (ref 5–15)
BUN: 15 mg/dL (ref 6–20)
CHLORIDE: 107 mmol/L (ref 101–111)
CO2: 22 mmol/L (ref 22–32)
CREATININE: 0.93 mg/dL (ref 0.61–1.24)
Calcium: 9.5 mg/dL (ref 8.9–10.3)
Glucose, Bld: 97 mg/dL (ref 65–99)
POTASSIUM: 3.6 mmol/L (ref 3.5–5.1)
SODIUM: 139 mmol/L (ref 135–145)
Total Bilirubin: 0.7 mg/dL (ref 0.3–1.2)
Total Protein: 7.5 g/dL (ref 6.5–8.1)

## 2015-06-21 LAB — LIPASE, BLOOD: LIPASE: 21 U/L (ref 11–51)

## 2015-06-21 MED ORDER — SODIUM CHLORIDE 0.9 % IV BOLUS (SEPSIS)
1000.0000 mL | Freq: Once | INTRAVENOUS | Status: AC
  Administered 2015-06-21: 1000 mL via INTRAVENOUS

## 2015-06-21 MED ORDER — ONDANSETRON HCL 4 MG/2ML IJ SOLN
4.0000 mg | Freq: Once | INTRAMUSCULAR | Status: AC
  Administered 2015-06-21: 4 mg via INTRAVENOUS
  Filled 2015-06-21: qty 2

## 2015-06-21 MED ORDER — ONDANSETRON 4 MG PO TBDP: 8.0000 mg | ORAL_TABLET | Freq: Once | ORAL | Status: DC

## 2015-06-21 MED ORDER — PENICILLIN V POTASSIUM 500 MG PO TABS
500.0000 mg | ORAL_TABLET | Freq: Four times a day (QID) | ORAL | Status: AC
Start: 2015-06-21 — End: 2015-06-28

## 2015-06-21 MED ORDER — OXYCODONE-ACETAMINOPHEN 5-325 MG PO TABS
1.0000 | ORAL_TABLET | Freq: Once | ORAL | Status: AC
  Administered 2015-06-21: 1 via ORAL
  Filled 2015-06-21: qty 1

## 2015-06-21 MED ORDER — ONDANSETRON 4 MG PO TBDP: 4.0000 mg | ORAL_TABLET | Freq: Three times a day (TID) | ORAL | Status: DC | PRN

## 2015-06-21 NOTE — Telephone Encounter (Signed)
Patient called requesting a medication refill for clonazePAM [facility](KLONOPIN). Patient would like to know if medicine can be filled early? Please follow up with patient.

## 2015-06-21 NOTE — ED Notes (Addendum)
Pt from home with c/o continuing abdominal pain. No changes today compared to previous visits.  Pt reports he has a GI appointment tomorrow but has not followed up with PCP since last week.  Pt states he came to ED today originally for dental pain and referral to dentist but decided to be seen again for abdominal pain since his home meds are continuing not to help.  Pt reports x 3 emesis in last 24 hours.  NAD, A&O.

## 2015-06-21 NOTE — ED Provider Notes (Signed)
CSN: [id number]     Arrival date & time 06/21/15  0745 History   First MD Initiated Contact with Patient 06/21/15 [phone]     Chief Complaint  Patient presents with  . Dental Pain     (Consider location/radiation/quality/duration/timing/severity/associated sxs/prior Treatment) HPI   Patient is a 29 year old male with past medical history of chronic abdominal pain who presents the ED with complaint of dental pain, onset 3 days. Patient reports having pain to his left front tooth and right upper canine. He states his pain worsened after having multiple episodes of vomiting associated with his chronic abdominal pain. He states he has taken ibuprofen at home with no relief. Denies fever, chills, facial/neck swelling, drainage, dysphasia, drooling, difficulty breathing, stridor. Patient states he was told by dentist that he needed to come to the emergency department to have a referral before being seen.   Patient also reports having worsening chronic abdominal pain. He reports having constant sharp pain in the left upper quadrant, denies any aggravating or alleviating factors. Endorses associated nausea with multiple episodes of NBNB vomiting. Denies fever, diarrhea, urinary symptoms, blood in urine or stool. Patient was seen in the emergency department twice over the past week  for same abdominal pain, negative CT scan and abdominal x-ray, labs unremarkable. He notes his pain improve yesterday but states once he returned home he started having worsening pain and multiple episodes of vomiting. He notes he is currently out of his medications and has not been taking anything for pain or vomiting. Patient states he has an appointment with gastroenterology scheduled for tomorrow.  Past Medical History  Diagnosis Date  . Medical history non-contributory   . Anxiety   . Panic attack as reaction to stress   . Anemia   . Depression   . Gallstones   . Dysrhythmia     irregular heart rate - due to zoloft  .  Bipolar disorder (HCC)   . Cirrhosis (HCC)   . Constipation    Past Surgical History  Procedure Laterality Date  . Cholecystectomy    . Tonsillectomy    . Incise and drain abcess    . Mouth surgery    . Esophagogastroduodenoscopy N/A 05/22/2015    Procedure: ESOPHAGOGASTRODUODENOSCOPY (EGD);  Surgeon: Vida RiggerMarc Magod, MD;  [facility];  Service: Endoscopy;  Laterality: N/A;   Family History  Problem Relation Age of Onset  . Irritable bowel syndrome Mother   . Kidney disease Mother   . Ulcerative colitis Maternal Grandfather   . Colon cancer Neg Hx   . Colon polyps Neg Hx   . Gallbladder disease Neg Hx   . Esophageal cancer Neg Hx   . Diabetes Neg Hx    Social History  Substance Use Topics  . Smoking status: Never Smoker   . Smokeless tobacco: Never Used  . Alcohol Use: No    Review of Systems  HENT: Positive for dental problem.   Gastrointestinal: Positive for nausea, vomiting and abdominal pain.  All other systems reviewed and are negative.     Allergies  Tramadol and Zoloft  Home Medications   Prior to Admission medications   Medication Sig Start Date End Date Taking? Authorizing Provider  acetaminophen-codeine (TYLENOL #3) 300-30 MG tablet Take 1 tablet by mouth every 6 (six) hours as needed for moderate pain. Refill on or after 06/23/2015 06/14/15   Dessa PhiJosalyn Funches, MD  clidinium-chlordiazePOXIDE (LIBRAX) 5-2.5 MG capsule Take 1 capsule by mouth daily.    Historical Provider, MD  clonazePAM (  KLONOPIN) 0.5 MG tablet Take 1 tablet (0.5 mg total) by mouth 2 (two) times daily. 05/29/15   Josalyn Funches, MD  diphenhydrAMINE (BENADRYL) 25 mg capsule Take 50 mg by mouth at bedtime as needed for sleep.    Historical Provider, MD  fluconazole (DIFLUCAN) 200 MG tablet Take 1 tablet (200 mg total) by mouth daily. 400 mg on day one, then 200 mg daily for 14-21 days Patient not taking: Reported on 06/19/2015 05/24/15   Dessa Phi, MD  ibuprofen (ADVIL,MOTRIN) 200  MG tablet Take 600 mg by mouth every 6 (six) hours as needed for moderate pain.    Historical Provider, MD  ibuprofen (ADVIL,MOTRIN) 800 MG tablet Take 1 tablet (800 mg total) by mouth 3 (three) times daily. 06/13/15   Samantha Tripp Dowless, PA-C  Linaclotide (LINZESS) 145 MCG CAPS capsule Take 2 capsules (290 mcg total) by mouth daily. Dr. Ewing Schlein Patient not taking: Reported on 06/19/2015 05/29/15   Dessa Phi, MD  methocarbamol (ROBAXIN) 500 MG tablet Take 1 tablet (500 mg total) by mouth every 8 (eight) hours as needed for muscle spasms. 06/14/15   Josalyn Funches, MD  metoCLOPramide (REGLAN) 10 MG tablet Take 1 tablet (10 mg total) by mouth daily as needed for nausea. 05/10/15   Azalia Bilis, MD  omeprazole (PRILOSEC) 20 MG capsule Take 1 capsule (20 mg total) by mouth daily. For 2 weeks, then as needed Patient not taking: Reported on 06/19/2015 05/10/15   Azalia Bilis, MD  ondansetron [facility] ODT) 4 MG disintegrating tablet Take 1 tablet (4 mg total) by mouth every 8 (eight) hours as needed for nausea or vomiting. 06/21/15   Barrett Henle, PA-C  penicillin v potassium (VEETID) 500 MG tablet Take 1 tablet (500 mg total) by mouth 4 (four) times daily. 06/21/15 06/28/15  Barrett Henle, PA-C  promethazine (PHENERGAN) 25 MG tablet Take 1 tablet (25 mg total) by mouth every 8 (eight) hours as needed for nausea or vomiting. 05/29/15   Josalyn Funches, MD   BP 132/79 mmHg  Pulse 103  Temp(Src) 98.5 F (36.9 C) (Oral)  Resp 19  SpO2 99% Physical Exam  Constitutional: He is oriented to person, place, and time. He appears well-developed and well-nourished.  Pt appears to be in mild discomfort  HENT:  Head: Normocephalic and atraumatic.  Mouth/Throat: Uvula is midline, oropharynx is clear and moist and mucous membranes are normal. No oral lesions. No trismus in the jaw. Abnormal dentition. Dental caries present. No dental abscesses, uvula swelling or lacerations. No oropharyngeal  exudate.    Eyes: Conjunctivae and EOM are normal. Right eye exhibits no discharge. Left eye exhibits no discharge. No scleral icterus.  Neck: Normal range of motion. Neck supple.  Cardiovascular: Normal rate, regular rhythm, normal heart sounds and intact distal pulses.   Pulmonary/Chest: Effort normal and breath sounds normal. No respiratory distress. He has no wheezes. He has no rales. He exhibits no tenderness.  Abdominal: Soft. Bowel sounds are normal. He exhibits no distension and no mass. There is tenderness (diffuse tenderness, worse in RUQ). There is no rebound and no guarding.  Musculoskeletal: He exhibits no edema.  Lymphadenopathy:    He has no cervical adenopathy.  Neurological: He is alert and oriented to person, place, and time.  Skin: Skin is warm and dry.  Nursing note and vitals reviewed.   ED Course  Procedures (including critical care time) Labs Review Labs Reviewed  CBC WITH DIFFERENTIAL/PLATELET - Abnormal; Notable for the following:    Hemoglobin  11.7 (*)    HCT 36.6 (*)    MCV 77.1 (*)    MCH 24.6 (*)    RDW 15.9 (*)    All other components within normal limits  COMPREHENSIVE METABOLIC PANEL - Abnormal; Notable for the following:    ALT 11 (*)    All other components within normal limits  LIPASE, BLOOD    Imaging Review No results found. I have personally reviewed and evaluated these images and lab results as part of my medical decision-making.  Filed Vitals:   06/21/15 0930 06/21/15 0943  BP: 132/79 132/79  Pulse: 100 103  Temp:    Resp:  19     MDM   Final diagnoses:  Pain, dental  Chronic abdominal pain    Patient presents with dental pain for the past 3 days. Denies fever, facial/neck swelling, drooling, dysphasia, drainage. Pt also endorses having worsening abdominal pain consistent with his chronic abdominal pain with associated N/V. Patient was seen in the emergency department twice over the past week  for same abdominal pain,  negative CT scan and abdominal x-ray, labs unremarkable, he has scheduled f/u with GI tomorrow. VSS. Exam revealed poor dentition with multiple dental caries and extracted teeth, decaying left upper incisor and right upper lateral canine, no evidence of dental abscess. Abdominal exam revealed diffuse tenderness throughout, worse in right upper quadrant, no peritoneal signs. Patient given IV fluids Zofran and pain meds. Labs unremarkable. Patient's abdominal pain appears to be consistent with chronic abdominal pain, due to recent CT and x-rays I do not feel any further imaging is warranted at this time. Advised patient to follow up with GI at his scheduled appointment tomorrow. Plan to discharge patient home with antibiotics and resources for dental follow-up.  Evaluation does not show pathology requring ongoing emergent intervention or admission. Pt is hemodynamically stable and mentating appropriately. Discussed findings/results and plan with patient/guardian, who agrees with plan. All questions answered. Return precautions discussed and outpatient follow up given.      Satira Sark [place], [place] 06/21/15 1610  Laurence Spates, MD 06/22/15 [phone]

## 2015-06-21 NOTE — Discharge Instructions (Signed)
Take your medications as prescribed. I also recommend continuing to take the pain meds that were prescribed to by her primary care provider regarding your chronic abdominal pain. Follow-up at your scheduled appointment tomorrow with your gastroenterologist. Follow-up with a dentist regarding your dental pain as soon as possible. Please return to the Emergency Department if symptoms worsen or new onset of fever, chills, facial/neck swelling, difficulty breathing, difficulty swallow, drooling, drainage, vomiting blood.  [facility] [employer] [facility] [address] [place], [place] [address] Phone [phone]  [facility] [employer] [facility] in [place], [place], exemplifies the [employer] vision to improve the health and quality of life of all [place] by Public house managercreating leaders with a passion to care for the underserved and by leading the nation in community-based, service learning oral health education.  We are committed to offering comprehensive general dental services for adults, children and special needs patients in a safe, caring and professional setting.   Appointments: Our clinic is open Monday through Friday 8:00 a.m. until 5:00 p.m. The amount of time scheduled for an appointment depends on the patients specific needs. We ask that you keep your appointed time for care or provide 24-hour notice of all appointment changes. Parents or legal guardians must accompany minor children.   Payment for Services: Medicaid and other insurance plans are welcome. Payment for services is due when services are rendered and may be made by cash or credit card. If you have dental insurance, we will assist you with your claim submission.    Emergencies:  Emergency services will be provided Monday through Friday on a walk-in basis.  Please arrive early for  emergency services. After hours emergency services will be provided for patients of record as required.   Services:  Comprehensive General Dentistry Childrens Dentistry Oral Surgery - Extractions Root Canals Sealants and Tooth Colored Fillings Crowns and Bridges Dentures and Partial Dentures Implant Services Periodontal Services and Retail buyerCleanings Cosmetic Tooth Whitening Digital Radiography 3-D/Cone Beam Imaging    Emergency Department Resource Guide 1) Find a Doctor and Pay Out of Pocket Although you won't have to find out who is covered by your insurance plan, it is a good idea to ask around and get recommendations. You will then need to call the office and see if the doctor you have chosen will accept you as a new patient and what types of options they offer for patients who are self-pay. Some doctors offer discounts or will set up payment plans for their patients who do not have insurance, but you will need to ask so you aren't surprised when you get to your appointment.  2) Contact Your Local Health Department Not all health departments have doctors that can see patients for sick visits, but many do, so it is worth a call to see if yours does. If you don't know where your local health department is, you can check in your phone book. The CDC also has a tool to help you locate your state's health department, and many state websites also have listings of all of their local health departments.  3) Find a Walk-in Clinic If your illness is not likely to be very severe or complicated, you may want to try a walk in clinic. These are popping up all over the country in pharmacies, drugstores, and shopping centers. They're usually staffed by nurse practitioners or physician assistants that have been trained to treat common  illnesses and complaints. They're usually fairly quick and inexpensive. However, if you have serious medical issues or chronic medical problems, these are probably not your best  option.  No Primary Care Doctor: - Call Health Connect at  [phone] - they can help you locate a primary care doctor that  accepts your insurance, provides certain services, etc. - Physician Referral Service- [phone]  Chronic Pain Problems: Organization         Address  Phone   Notes  [facility]  [phone] Patients need to be referred by their primary care doctor.   Medication Assistance: Organization         Address  Phone   Notes  [facility] Medication [facility] [address] [place] [place], [place] [address] [phone] --Must be a resident of [facility] -- Must have NO insurance coverage whatsoever (no Medicaid/ Medicare, etc.) -- The pt. MUST have a primary care doctor that directs their care regularly and follows them in the community   MedAssist  [phone]   [employer]  [phone]    Agencies that provide inexpensive medical care: Organization         Address  Phone   Notes  [facility]  [phone]   [facility]    [phone]   [facility] [address] [place], [place] [address] [phone]   [facility] [address] [place] [phone]   Planned Parenthood    5410991935   [facility]    [phone]   [facility]  [address], [place] Phone:  [phone], Fax:  [phone] Hours of Operation:  9 am - 6 pm, M-F.  Also accepts Medicaid/Medicare and self-pay.  [facility]  [address], [place] Phone: [phone], Fax: [phone]. Hours of Operation:  8:30 am - 5:30 pm, M-F.  Also accepts Medicaid and self-pay.  [facility] [place] [address] [place] Phone: [phone]   [facility] [address] Natasha Bence [place], [place] [phone], Ext. [phone] Mondays & Thursdays: 7-9 AM.  First 15  patients are seen on a first come, first serve basis.    Medicaid-accepting [facility] Providers:  Organization         Address  Phone   Notes  [facility] [address],  [phone] Also accepts self-pay patients.  [facility] [address] Laurell Josephs [place], [place]  [phone]   [facility] [address], [place] [phone]   [facility] [address] [place] [phone]   Renaye Rakers [address], [place]   [phone] Only accepts [place] Access [place] patients after they have their name applied to their card.   Self-Pay (no insurance) in [facility]:  Organization         Address  Phone   Notes  Sickle Cell Patients, [facility] [address] [place], [place] [phone]   [facility] [address] [place], [place] [phone]   [facility]  [address], [place] [phone]   [facility]/Dr. Osei-Bonsu  [address] [place] or [address], Laurell Josephs [address],  [place] [phone] Phone number for both [facility] and [place] locations is the same.  [facility] [address], [place] [phone]   [facility] [address] [place] or [address] [phone]   [facility] [address] [place] [phone], phone; [phone], fax Sees patients 1st and 3rd Saturday of every month.  Must not qualify for public or private insurance (i.e. Medicaid, Medicare, [place] Health Choice, Veterans' Benefits)  Household income should be no more than 200% of the poverty level The clinic cannot treat you if you are pregnant or think you are pregnant  Sexually transmitted diseases are not treated at the clinic.    Dental  Care: Organization         Address  Phone  Notes  [facility] Department of [facility] [address] [place], [place] [phone] Accepts children up to age 72 who are enrolled in [place] or [place] Health Choice; pregnant women with a Medicaid card; and children who have applied for Medicaid or [place] Health Choice, but were declined, whose parents can pay a reduced fee at time of service.  [facility] Department of [facility]  [address], [place] [phone] Accepts children up to age 40 who are enrolled in [place] or [employer] Health Choice; pregnant women with a Medicaid card; and children who have applied for Medicaid or  Health Choice, but were declined, whose parents can pay a reduced fee at time of service.  [facility]  [address] [place], [place] [phone] Patients are seen by appointment only. Walk-ins are not accepted. [facility] will see patients 84 years of age and older. Monday - Tuesday (8am-5pm) Most Wednesdays (8:30-5pm) $30 per visit, cash only  [facility]  [address], [facility] [phone] Patients are seen by appointment only. Walk-ins are not accepted. [facility] will see patients 75 years of age and older. One Wednesday Evening (Monthly: Volunteer Based).  $30 per visit, cash only  [employer]  [phone] for adults; Children under age 62, call Graduate Pediatric Dentistry at [phone]. Children aged 66-14, please call [phone] to request a pediatric application.  Dental services are provided in all areas of dental care including fillings, crowns and bridges, complete and partial dentures, implants, gum treatment, root canals, and extractions. Preventive care is also provided. Treatment is provided to both adults and children. Patients are selected via a lottery and there is often a waiting list.   [facility] [address], [place]  [phone] [email or web address]   [facility] [address] [place], [place] [phone], Ext. [phone] Second and Fourth Thursday of each month, opens at 6:30 AM; Clinic ends at 9 AM.  Patients are seen on a first-come first-served basis, and a limited number are seen during each clinic.   [facility]  [address] Ether Griffins [place], [place] [phone]   Eligibility Requirements You must have lived in [place], [place], or [place] counties for at least the last three months.   You cannot be eligible for state or federal sponsored [place], including [employer], [place], or [employer].   You generally cannot be eligible for healthcare insurance through your employer.    How to apply: Eligibility screenings are held every Tuesday and  Wednesday afternoon from 1:00 pm until 4:00 pm. You do not need an appointment for the interview!  [facility] [address] [place], [place] [phone]   [facility] Health Department  [phone]   [facility] Health Department  [phone]   [facility] Health Department  [phone]    Behavioral Health Resources in the Community: Intensive Outpatient Programs Organization         Address  Phone  Notes  [facility] Services [address] [place], [place] [phone]   [facility] Outpatient [address] [place], [place] [phone]   [facility]: [facility] [address], [place], [place]  [phone]   [facility] Mental Health [address]  [place], [place] [phone] or [phone]   Substance Abuse Resources Organization         Address  Phone  Notes  Al[facility] Drug Services  [phone]   [facility]  [phone]   [facility] [place]  [phone]   Floydene Flock  [phone]   Residential & Outpatient Substance Abuse Program  [phone]    Psychological Services Organization         Address  Phone  Notes  [facility]  [phone]   [facility]  [phone]   [facility] [address], [place] [phone] or [phone]    Mobile Crisis Teams Organization         Address  Phone  Notes  [facility], Mobile Crisis Care Unit  [phone]   [facility]  [address] [place], [place] [phone]   Doristine Locks [address] [place] [phone]    Self-Help/Support Groups Organization         Address  Phone             Notes  [facility] [place] - variety of support groups  336- I7437963 Call for more information  Narcotics Anonymous (NA), [facility] [address], [employer] [place]  2 meetings at this location   Statistician         Address  Phone  Notes  [facility] [address],    [place]  [phone]   [facility]  [address] [place] [address], [place], [place] [phone]   [facility] [address] [place], [place] [phone] Admissions: 8am-3pm M-F  [facility] [address]    [place], [place] [phone]   [facility] [address] [place], [place], [place] [phone]   [facility] [address],  [place], [place] [phone]   [facility] - Intensive Outpatient [address] Laurell Joseph[address], [place], [place] [phone]   [facility] ([facility] [address] [place].,  [place], [place] [phone] or [phone]   [facility] ([facility]) [address] [place], [place] [phone] Accepts Medicaid  [facility] [address],  [place] [phone] Substance Abuse/Addiction Treatment   [facility] Organization         Address  Phone  Notes  [facility]  [phone]   Angie Fava, PhD [address] Ervin Knack [place], [place]   [phone] or [phone]   [facility] Behavioral   [address] [place], [place] [phone]   [facility] [address], [place], [place] [phone] Insurance/Medicaid/sponsorship through [employer]  and Families [address]                                    [place], [place] [phone] Therapy/tele-psych/case  [facility] [address]   [place], [place] [phone]    Dr. Lolly Mustache  [phone]   [facility]  [employer] [facility] Dept. 1) [address], [place] 2) [address], [place] 3)  [address], [place] [phone]  [phone]   [facility] Child Abuse Hotline 859-449-3066 or 3120561635 (After Hours)

## 2015-06-21 NOTE — Telephone Encounter (Signed)
Patient called [facility], requesting refill for clonazepam. Prescription was printed at last OV.  [facility].  [facility] there is a prescription on hold. Patient went to pharmacy to fill prescription, it is too early to be filled.  Nurse called patient, patient aware of prescription cannot be refilled, New prescription cannot be printed. Patient angry because medication cannot be refilled.  Patient reports being aware of refill request being requested too early.  Patient request to speak to Dr. Armen PickupFunches, nurse explained Dr. Armen PickupFunches is in clinic. Patient tells nurse he is just doing what [facility] told him to do and call the clinic to get Dr. Armen PickupFunches to approve medication to be refilled too soon. Nurse explained medication cannot be refilled at this time.  Patient continues to be verbally upset.  Nurse asked patient to hold. Nurse requested office manager to speak with patient. Print production plannerffice manager agrees to speak with patient.

## 2015-06-22 ENCOUNTER — Telehealth: Payer: Self-pay | Admitting: Family Medicine

## 2015-06-22 ENCOUNTER — Encounter (HOSPITAL_COMMUNITY)
Admission: RE | Admit: 2015-06-22 | Discharge: 2015-06-22 | Disposition: A | Discharge status: Home / Self Care | Payer: Self-pay | Source: Ambulatory Visit | Attending: Gastroenterology | Admitting: Gastroenterology

## 2015-06-22 DIAGNOSIS — R112 Nausea with vomiting, unspecified: Secondary | ICD-10-CM | POA: Insufficient documentation

## 2015-06-22 DIAGNOSIS — R1084 Generalized abdominal pain: Secondary | ICD-10-CM | POA: Insufficient documentation

## 2015-06-22 MED ORDER — TECHNETIUM TC 99M SULFUR COLLOID
2.0000 | Freq: Once | INTRAVENOUS | Status: AC | PRN
  Administered 2015-06-22: 2 via ORAL

## 2015-06-22 NOTE — Telephone Encounter (Signed)
Patient needing referral to dentist

## 2015-06-26 ENCOUNTER — Telehealth: Payer: Self-pay | Admitting: Family Medicine

## 2015-06-26 DIAGNOSIS — K0889 Other specified disorders of teeth and supporting structures: Secondary | ICD-10-CM

## 2015-06-26 DIAGNOSIS — M542 Cervicalgia: Secondary | ICD-10-CM

## 2015-06-26 NOTE — Telephone Encounter (Signed)
Patient went to his orthopaedics office today and was not treated for his neck and focused on his abdominal pain and patient is requesting to speak to you. Patient needs to know what to do from here.

## 2015-06-27 ENCOUNTER — Other Ambulatory Visit: Payer: Self-pay | Admitting: Gastroenterology

## 2015-06-27 ENCOUNTER — Telehealth: Payer: Self-pay | Admitting: Family Medicine

## 2015-06-27 ENCOUNTER — Ambulatory Visit: Payer: Self-pay | Attending: Family Medicine | Admitting: Internal Medicine

## 2015-06-27 ENCOUNTER — Ambulatory Visit (HOSPITAL_COMMUNITY)
Admission: RE | Admit: 2015-06-27 | Discharge: 2015-06-27 | Disposition: A | Discharge status: Home / Self Care | Payer: MEDICAID | Source: Ambulatory Visit | Attending: Cardiology | Admitting: Cardiology

## 2015-06-27 VITALS — BP 147/91 | HR 98 | Temp 98.0°F | Resp 24

## 2015-06-27 DIAGNOSIS — R198 Other specified symptoms and signs involving the digestive system and abdomen: Secondary | ICD-10-CM

## 2015-06-27 DIAGNOSIS — R1084 Generalized abdominal pain: Secondary | ICD-10-CM

## 2015-06-27 DIAGNOSIS — R9389 Abnormal findings on diagnostic imaging of other specified body structures: Secondary | ICD-10-CM

## 2015-06-27 DIAGNOSIS — R109 Unspecified abdominal pain: Secondary | ICD-10-CM

## 2015-06-27 DIAGNOSIS — R112 Nausea with vomiting, unspecified: Secondary | ICD-10-CM

## 2015-06-27 DIAGNOSIS — R079 Chest pain, unspecified: Secondary | ICD-10-CM

## 2015-06-27 DIAGNOSIS — R933 Abnormal findings on diagnostic imaging of other parts of digestive tract: Secondary | ICD-10-CM | POA: Insufficient documentation

## 2015-06-27 MED ORDER — IBUPROFEN 200 MG PO TABS
400.0000 mg | ORAL_TABLET | Freq: Once | ORAL | Status: AC
Start: 2015-06-27 — End: 2015-06-27
  Administered 2015-06-27: 400 mg via ORAL

## 2015-06-27 NOTE — Telephone Encounter (Signed)
Attempted to call patient. No answer Left VM. Requesting patient call back for information.  If/when patient calls please give him the following info,   Dr. Armen PickupFunches is sorry that he is having difficulty with ortho and his neck.  Patient is reminded that ortho is concerned that if his nausea and vomiting are not controlled his risk of complications following any neck procedure are high.  He is advised to keep GI f.u to work towards controlling GI symptoms and once controlled, perhaps ortho will be more helpful.  I have placed the requested dental referral, also placed a referral to pain management for neck pain

## 2015-06-27 NOTE — Telephone Encounter (Signed)
Patient returned phone call. Patient was given Doctor Instructions

## 2015-06-27 NOTE — Telephone Encounter (Signed)
Dental referral placed.

## 2015-06-27 NOTE — Progress Notes (Signed)
Patient in waiting room holding chest. Patient reports chest pain as crushing, at level 7, started 30 minutes ago. Vital signs are as follows: BP 147/91, P 98, R 24, 99% O2, Oral Temp 98.0. EKG completed and reviewed by Dr. Venetia NightAmao. Dr. Venetia NightAmao went into room to see patient. Ibuprofen 400mg  given to patient per Dr. Jen MowAmao's request. Patient was waiting in lobby to have labs drawn [facility]. [facility] requested ferritin, iron panel, vitamin B-12, Folate, RBC. Nurse will enter orders. Patient yelling at staff, left without having labs drawn. Cancel lab orders per Dr. Hyman HopesJegede.

## 2015-06-28 ENCOUNTER — Ambulatory Visit
Admission: RE | Admit: 2015-06-28 | Discharge: 2015-06-28 | Disposition: A | Discharge status: Home / Self Care | Payer: No Typology Code available for payment source | Source: Ambulatory Visit | Attending: Gastroenterology | Admitting: Gastroenterology

## 2015-06-28 ENCOUNTER — Ambulatory Visit: Payer: MEDICAID | Attending: Family Medicine

## 2015-06-28 ENCOUNTER — Other Ambulatory Visit: Payer: Self-pay | Admitting: Family Medicine

## 2015-06-28 DIAGNOSIS — R9389 Abnormal findings on diagnostic imaging of other specified body structures: Secondary | ICD-10-CM

## 2015-06-28 DIAGNOSIS — R1084 Generalized abdominal pain: Secondary | ICD-10-CM

## 2015-06-28 DIAGNOSIS — R112 Nausea with vomiting, unspecified: Secondary | ICD-10-CM

## 2015-06-28 DIAGNOSIS — G8929 Other chronic pain: Secondary | ICD-10-CM

## 2015-06-28 DIAGNOSIS — R109 Unspecified abdominal pain: Principal | ICD-10-CM

## 2015-06-28 MED ORDER — IOPAMIDOL (ISOVUE-300) INJECTION 61%
100.0000 mL | Freq: Once | INTRAVENOUS | Status: AC | PRN
  Administered 2015-06-28: 100 mL via INTRAVENOUS

## 2015-07-03 ENCOUNTER — Encounter (HOSPITAL_COMMUNITY): Payer: Self-pay | Admitting: *Deleted

## 2015-07-03 ENCOUNTER — Emergency Department (HOSPITAL_COMMUNITY)
Admission: EM | Admit: 2015-07-03 | Discharge: 2015-07-03 | Disposition: A | Discharge status: Home / Self Care | Payer: No Typology Code available for payment source | Attending: Emergency Medicine | Admitting: Emergency Medicine

## 2015-07-03 ENCOUNTER — Emergency Department (HOSPITAL_COMMUNITY): Payer: No Typology Code available for payment source

## 2015-07-03 DIAGNOSIS — M25562 Pain in left knee: Secondary | ICD-10-CM | POA: Insufficient documentation

## 2015-07-03 DIAGNOSIS — Z79899 Other long term (current) drug therapy: Secondary | ICD-10-CM | POA: Insufficient documentation

## 2015-07-03 DIAGNOSIS — Z862 Personal history of diseases of the blood and blood-forming organs and certain disorders involving the immune mechanism: Secondary | ICD-10-CM | POA: Insufficient documentation

## 2015-07-03 DIAGNOSIS — Z9049 Acquired absence of other specified parts of digestive tract: Secondary | ICD-10-CM | POA: Insufficient documentation

## 2015-07-03 DIAGNOSIS — F410 Panic disorder [episodic paroxysmal anxiety] without agoraphobia: Secondary | ICD-10-CM | POA: Insufficient documentation

## 2015-07-03 DIAGNOSIS — K5900 Constipation, unspecified: Secondary | ICD-10-CM | POA: Insufficient documentation

## 2015-07-03 DIAGNOSIS — F319 Bipolar disorder, unspecified: Secondary | ICD-10-CM | POA: Insufficient documentation

## 2015-07-03 DIAGNOSIS — Z8679 Personal history of other diseases of the circulatory system: Secondary | ICD-10-CM | POA: Insufficient documentation

## 2015-07-03 DIAGNOSIS — R1033 Periumbilical pain: Secondary | ICD-10-CM | POA: Insufficient documentation

## 2015-07-03 DIAGNOSIS — R109 Unspecified abdominal pain: Secondary | ICD-10-CM

## 2015-07-03 LAB — CBC WITH DIFFERENTIAL/PLATELET
BASOS ABS: 0 10*3/uL (ref 0.0–0.1)
Basophils Relative: 0 %
EOS ABS: 0.3 10*3/uL (ref 0.0–0.7)
EOS PCT: 3 %
HCT: 36 % — ABNORMAL LOW (ref 39.0–52.0)
Hemoglobin: 11.7 g/dL — ABNORMAL LOW (ref 13.0–17.0)
Lymphocytes Relative: 21 %
Lymphs Abs: 1.9 10*3/uL (ref 0.7–4.0)
MCH: 24.9 pg — AB (ref 26.0–34.0)
MCHC: 32.5 g/dL (ref 30.0–36.0)
MCV: 76.8 fL — ABNORMAL LOW (ref 78.0–100.0)
Monocytes Absolute: 0.5 10*3/uL (ref 0.1–1.0)
Monocytes Relative: 5 %
Neutro Abs: 6.5 10*3/uL (ref 1.7–7.7)
Neutrophils Relative %: 71 %
PLATELETS: 290 10*3/uL (ref 150–400)
RBC: 4.69 MIL/uL (ref 4.22–5.81)
RDW: 16.3 % — ABNORMAL HIGH (ref 11.5–15.5)
WBC: 9.2 10*3/uL (ref 4.0–10.5)

## 2015-07-03 LAB — COMPREHENSIVE METABOLIC PANEL
ALT: 10 U/L — AB (ref 17–63)
AST: 20 U/L (ref 15–41)
Albumin: 4.2 g/dL (ref 3.5–5.0)
Alkaline Phosphatase: 69 U/L (ref 38–126)
Anion gap: 9 (ref 5–15)
BUN: 16 mg/dL (ref 6–20)
CHLORIDE: 102 mmol/L (ref 101–111)
CO2: 27 mmol/L (ref 22–32)
CREATININE: 0.89 mg/dL (ref 0.61–1.24)
Calcium: 9.2 mg/dL (ref 8.9–10.3)
GFR calc non Af Amer: >60 mL/min (ref >60)
Glucose, Bld: 100 mg/dL — ABNORMAL HIGH (ref 65–99)
Potassium: 3.6 mmol/L (ref 3.5–5.1)
SODIUM: 138 mmol/L (ref 135–145)
Total Bilirubin: 0.2 mg/dL — ABNORMAL LOW (ref 0.3–1.2)
Total Protein: 7.8 g/dL (ref 6.5–8.1)

## 2015-07-03 LAB — LIPASE, BLOOD: Lipase: 23 U/L (ref 11–51)

## 2015-07-03 MED ORDER — PANTOPRAZOLE SODIUM 40 MG IV SOLR
40.0000 mg | Freq: Once | INTRAVENOUS | Status: AC
  Administered 2015-07-03: 40 mg via INTRAVENOUS
  Filled 2015-07-03: qty 40

## 2015-07-03 MED ORDER — OXYCODONE-ACETAMINOPHEN 5-325 MG PO TABS: 1.0000 | ORAL_TABLET | ORAL | Status: DC | PRN

## 2015-07-03 MED ORDER — ONDANSETRON HCL 4 MG/2ML IJ SOLN
4.0000 mg | Freq: Once | INTRAMUSCULAR | Status: AC
  Administered 2015-07-03: 4 mg via INTRAVENOUS
  Filled 2015-07-03: qty 2

## 2015-07-03 MED ORDER — HYDROMORPHONE HCL 1 MG/ML IJ SOLN
1.0000 mg | Freq: Once | INTRAMUSCULAR | Status: AC
  Administered 2015-07-03: 1 mg via INTRAVENOUS
  Filled 2015-07-03: qty 1

## 2015-07-03 NOTE — ED Notes (Signed)
Pt ambulated to RR unassisted 

## 2015-07-03 NOTE — Discharge Instructions (Signed)
Follow-up with your family doctor for your abdominal pain. Follow-up with your orthopedic doctor for your knee

## 2015-07-03 NOTE — ED Provider Notes (Signed)
CSN: [id number]     Arrival date & time 07/03/15  0645 History   First MD Initiated Contact with Patient 07/03/15 0700     Chief Complaint  Patient presents with  . Abdominal Pain  . Knee Pain     (Consider location/radiation/quality/duration/timing/severity/associated sxs/prior Treatment) Patient is a 29 y.o. male presenting with abdominal pain and knee pain. The history is provided by the patient (The patient complains of abdominal pain. He has had a CT scan of the abdomen and the gastric emptying study which were unremarkable. He is being cared for by a GI doctor).  Abdominal Pain Pain location:  Epigastric Pain quality: aching   Pain radiates to:  Does not radiate Pain severity:  Moderate Onset quality:  Gradual Timing:  Constant Progression:  Worsening Chronicity:  Recurrent Context: not alcohol use   Associated symptoms: no chest pain, no cough, no diarrhea, no fatigue and no hematuria   Knee Pain Associated symptoms: no back pain and no fatigue     Past Medical History  Diagnosis Date  . Medical history non-contributory   . Anxiety   . Panic attack as reaction to stress   . Anemia   . Depression   . Gallstones   . Dysrhythmia     irregular heart rate - due to zoloft  . Bipolar disorder (HCC)   . Cirrhosis (HCC)   . Constipation    Past Surgical History  Procedure Laterality Date  . Cholecystectomy    . Tonsillectomy    . Incise and drain abcess    . Mouth surgery    . Esophagogastroduodenoscopy N/A 05/22/2015    Procedure: ESOPHAGOGASTRODUODENOSCOPY (EGD);  Surgeon: Vida RiggerMarc Magod, MD;  [facility];  Service: Endoscopy;  Laterality: N/A;   Family History  Problem Relation Age of Onset  . Irritable bowel syndrome Mother   . Kidney disease Mother   . Ulcerative colitis Maternal Grandfather   . Colon cancer Neg Hx   . Colon polyps Neg Hx   . Gallbladder disease Neg Hx   . Esophageal cancer Neg Hx   . Diabetes Neg Hx    Social History   Substance Use Topics  . Smoking status: Never Smoker   . Smokeless tobacco: Never Used  . Alcohol Use: No    Review of Systems  Constitutional: Negative for appetite change and fatigue.  HENT: Negative for congestion, ear discharge and sinus pressure.   Eyes: Negative for discharge.  Respiratory: Negative for cough.   Cardiovascular: Negative for chest pain.  Gastrointestinal: Positive for abdominal pain. Negative for diarrhea.  Genitourinary: Negative for frequency and hematuria.  Musculoskeletal: Negative for back pain.       Left knee pain  Skin: Negative for rash.  Neurological: Negative for seizures and headaches.  Psychiatric/Behavioral: Negative for hallucinations.      Allergies  Tramadol and Zoloft  Home Medications   Prior to Admission medications   Medication Sig Start Date End Date Taking? Authorizing Provider  acetaminophen-codeine (TYLENOL #3) 300-30 MG tablet Take 1 tablet by mouth every 6 (six) hours as needed for moderate pain. Refill on or after 06/23/2015 06/14/15  Yes Josalyn Funches, MD  clidinium-chlordiazePOXIDE (LIBRAX) 5-2.5 MG capsule Take 1 capsule by mouth daily.   Yes Historical Provider, MD  clonazePAM (KLONOPIN) 0.5 MG tablet Take 1 tablet (0.5 mg total) by mouth 2 (two) times daily. 05/29/15  Yes Josalyn Funches, MD  diclofenac sodium (VOLTAREN) 1 % GEL Apply 2 g topically daily as needed (joint pain).  Yes Historical Provider, MD  diphenhydrAMINE (BENADRYL) 25 mg capsule Take 50 mg by mouth at bedtime as needed for sleep.   Yes Historical Provider, MD  ibuprofen (ADVIL,MOTRIN) 200 MG tablet Take 400-800 mg by mouth every 6 (six) hours as needed for moderate pain.    Yes Historical Provider, MD  Linaclotide (LINZESS) 290 MCG CAPS capsule Take 290 mcg by mouth daily.   Yes Historical Provider, MD  methocarbamol (ROBAXIN) 500 MG tablet Take 1 tablet (500 mg total) by mouth every 8 (eight) hours as needed for muscle spasms. 06/14/15  Yes Josalyn  Funches, MD  metoCLOPramide (REGLAN) 10 MG tablet Take 1 tablet (10 mg total) by mouth daily as needed for nausea. 05/10/15  Yes Azalia Bilis, MD  omeprazole (PRILOSEC) 20 MG capsule Take 1 capsule (20 mg total) by mouth daily. For 2 weeks, then as needed 05/10/15  Yes Azalia Bilis, MD  ondansetron (ZOFRAN ODT) 4 MG disintegrating tablet Take 1 tablet (4 mg total) by mouth every 8 (eight) hours as needed for nausea or vomiting. 06/21/15  Yes Barrett Henle, PA-C  promethazine (PHENERGAN) 25 MG tablet Take 1 tablet (25 mg total) by mouth every 8 (eight) hours as needed for nausea or vomiting. 05/29/15  Yes Josalyn Funches, MD  ibuprofen (ADVIL,MOTRIN) 800 MG tablet Take 1 tablet (800 mg total) by mouth 3 (three) times daily. 06/13/15   Samantha Tripp Dowless, PA-C  Linaclotide (LINZESS) 145 MCG CAPS capsule Take 2 capsules (290 mcg total) by mouth daily. Dr. Ewing Schlein Patient not taking: Reported on 06/19/2015 05/29/15   Dessa Phi, MD  oxyCODONE-acetaminophen (PERCOCET) 5-325 MG tablet Take 1 tablet by mouth every 4 (four) hours as needed. 07/03/15   Bethann Berkshire, MD   BP 102/70 mmHg  Pulse 85  Temp(Src) 97.6 F (36.4 C) (Oral)  Resp 16  Ht  (1.702 m)  Wt 140 lb (63.504 kg)  BMI 21.92 kg/m2  SpO2 99% Physical Exam  Constitutional: He is oriented to person, place, and time. He appears well-developed.  HENT:  Head: Normocephalic.  Eyes: Conjunctivae and EOM are normal. No scleral icterus.  Neck: Neck supple. No thyromegaly present.  Cardiovascular: Normal rate and regular rhythm.  Exam reveals no gallop and no friction rub.   No murmur heard. Pulmonary/Chest: No stridor. He has no wheezes. He has no rales. He exhibits no tenderness.  Abdominal: He exhibits no distension. There is tenderness. There is no rebound.  Mild periumbilical tenderness  Musculoskeletal: Normal range of motion. He exhibits no edema.  Mild tenderness to left knee medial and lateral  Lymphadenopathy:     He has no cervical adenopathy.  Neurological: He is oriented to person, place, and time. He exhibits normal muscle tone. Coordination normal.  Skin: No rash noted. No erythema.  Psychiatric: He has a normal mood and affect. His behavior is normal.    ED Course  Procedures (including critical care time) Labs Review Labs Reviewed  CBC WITH DIFFERENTIAL/PLATELET - Abnormal; Notable for the following:    Hemoglobin 11.7 (*)    HCT 36.0 (*)    MCV 76.8 (*)    MCH 24.9 (*)    RDW 16.3 (*)    All other components within normal limits  COMPREHENSIVE METABOLIC PANEL - Abnormal; Notable for the following:    Glucose, Bld 100 (*)    ALT 10 (*)    Total Bilirubin 0.2 (*)    All other components within normal limits  LIPASE, BLOOD    Imaging  Review Dg Knee Complete 4 Views Left  07/03/2015  CLINICAL DATA:  Diffuse knee pain. No known injury. Unable to bear weight. Initial evaluation . EXAM: LEFT KNEE - COMPLETE 4+ VIEW COMPARISON:  None. FINDINGS: Questionable Set cortical irregularity with mild periosteal reaction is noted along the posterior aspect of the distal left femoral metaphysis. MRI of the left knee is suggested for further evaluation. No evidence of fracture or dislocation. No evidence of effusion . IMPRESSION: Questionable subtle cortical irregularity with mild periosteal reaction is noted along the posterior aspect of the distal left femoral metaphysis. MRI of the left knee is suggested for further evaluation. Electronically Signed   By: Maisie Fus  Register   On: 07/03/2015 08:35   Dg Abd Acute W/chest  07/03/2015  CLINICAL DATA:  29 year old male with reflux and mid abdominal pain EXAM: DG ABDOMEN ACUTE W/ 1V CHEST COMPARISON:  Recent prior imaging CT abdomen/pelvis 06/28/2015 FINDINGS: There is no evidence of dilated bowel loops or free intraperitoneal air. Surgical clips in the right upper quadrant suggest prior cholecystectomy. No radiopaque calculi or other significant radiographic  abnormality is seen. Heart size and mediastinal contours are within normal limits. Both lungs are clear. IMPRESSION: Negative abdominal radiographs.  No acute cardiopulmonary disease. No evidence of free air. Electronically Signed   By: Malachy Moan M.D.   On: 07/03/2015 08:33   I have personally reviewed and evaluated these images and lab results as part of my medical decision-making.   EKG Interpretation None      MDM   Final diagnoses:  Abdominal pain in male    Labs and x-rays unremarkable except for mild cortical irregularities on the left knee. Patient is to take Motrin 800 mg 3 times a day which she has. He is given a prescription of Percocet and will follow-up with his primary care doctor and orthopedic doctor    Bethann Berkshire, MD 07/03/15 1016

## 2015-07-03 NOTE — Telephone Encounter (Signed)
error 

## 2015-07-03 NOTE — ED Notes (Signed)
Pt states that he has had upper abd pain for over a month; pt states that he has been seen for his abd pain and states "I have tried to follow up with GI"; pt c/o burning to upper abd; pt c/o N/V; pt states that he has had 1 episode of vomiting in the last 24hrs; pt denies diarrhea; pt also c/o left knee pain; pt denies injury to knee;  Pt states that he is unable to bare weight to left knee due to pain

## 2015-07-03 NOTE — ED Notes (Signed)
Pt here from home with history of reflux. Pt states he has middle abdominal pain that has been persistent for over a month. Pt states he has been seen multiple times for this, and [facility] he thinks that did not result anything. Pt also has has left knee pain. There was no known injury and pt has no deformity or swelling to the area. Pt states he is not able to put weight on it, but he was able to get to the bus to get here today

## 2015-07-09 ENCOUNTER — Encounter (HOSPITAL_COMMUNITY): Payer: Self-pay

## 2015-07-09 ENCOUNTER — Emergency Department (HOSPITAL_COMMUNITY)
Admission: EM | Admit: 2015-07-09 | Discharge: 2015-07-09 | Disposition: A | Discharge status: Home / Self Care | Payer: No Typology Code available for payment source | Attending: Emergency Medicine | Admitting: Emergency Medicine

## 2015-07-09 DIAGNOSIS — Z79899 Other long term (current) drug therapy: Secondary | ICD-10-CM | POA: Insufficient documentation

## 2015-07-09 DIAGNOSIS — F410 Panic disorder [episodic paroxysmal anxiety] without agoraphobia: Secondary | ICD-10-CM | POA: Insufficient documentation

## 2015-07-09 DIAGNOSIS — R1013 Epigastric pain: Secondary | ICD-10-CM | POA: Insufficient documentation

## 2015-07-09 DIAGNOSIS — R109 Unspecified abdominal pain: Secondary | ICD-10-CM

## 2015-07-09 DIAGNOSIS — M25569 Pain in unspecified knee: Secondary | ICD-10-CM | POA: Insufficient documentation

## 2015-07-09 DIAGNOSIS — R112 Nausea with vomiting, unspecified: Secondary | ICD-10-CM | POA: Insufficient documentation

## 2015-07-09 DIAGNOSIS — Z862 Personal history of diseases of the blood and blood-forming organs and certain disorders involving the immune mechanism: Secondary | ICD-10-CM | POA: Insufficient documentation

## 2015-07-09 DIAGNOSIS — Z9049 Acquired absence of other specified parts of digestive tract: Secondary | ICD-10-CM | POA: Insufficient documentation

## 2015-07-09 DIAGNOSIS — F319 Bipolar disorder, unspecified: Secondary | ICD-10-CM | POA: Insufficient documentation

## 2015-07-09 DIAGNOSIS — Z8719 Personal history of other diseases of the digestive system: Secondary | ICD-10-CM | POA: Insufficient documentation

## 2015-07-09 LAB — URINALYSIS, ROUTINE W REFLEX MICROSCOPIC
BILIRUBIN URINE: NEGATIVE
Glucose, UA: NEGATIVE mg/dL
Hgb urine dipstick: NEGATIVE
KETONES UR: NEGATIVE mg/dL
LEUKOCYTES UA: NEGATIVE
NITRITE: NEGATIVE
PROTEIN: NEGATIVE mg/dL
Specific Gravity, Urine: 1.023 (ref 1.005–1.030)
pH: 7 (ref 5.0–8.0)

## 2015-07-09 LAB — CBC
HEMATOCRIT: 36.2 % — AB (ref 39.0–52.0)
Hemoglobin: 11.4 g/dL — ABNORMAL LOW (ref 13.0–17.0)
MCH: 24.6 pg — ABNORMAL LOW (ref 26.0–34.0)
MCHC: 31.5 g/dL (ref 30.0–36.0)
MCV: 78.2 fL (ref 78.0–100.0)
PLATELETS: 323 10*3/uL (ref 150–400)
RBC: 4.63 MIL/uL (ref 4.22–5.81)
RDW: 16.3 % — AB (ref 11.5–15.5)
WBC: 11.6 10*3/uL — AB (ref 4.0–10.5)

## 2015-07-09 LAB — COMPREHENSIVE METABOLIC PANEL
ALBUMIN: 4.3 g/dL (ref 3.5–5.0)
ALK PHOS: 67 U/L (ref 38–126)
ALT: 10 U/L — AB (ref 17–63)
ANION GAP: 7 (ref 5–15)
AST: 14 U/L — ABNORMAL LOW (ref 15–41)
BUN: 14 mg/dL (ref 6–20)
CALCIUM: 9.5 mg/dL (ref 8.9–10.3)
CHLORIDE: 106 mmol/L (ref 101–111)
CO2: 29 mmol/L (ref 22–32)
Creatinine, Ser: 0.81 mg/dL (ref 0.61–1.24)
GFR calc Af Amer: >60 mL/min (ref >60)
GFR calc non Af Amer: >60 mL/min (ref >60)
GLUCOSE: 103 mg/dL — AB (ref 65–99)
Potassium: 3.7 mmol/L (ref 3.5–5.1)
SODIUM: 142 mmol/L (ref 135–145)
TOTAL PROTEIN: 7.6 g/dL (ref 6.5–8.1)
Total Bilirubin: 0.3 mg/dL (ref 0.3–1.2)

## 2015-07-09 LAB — LIPASE, BLOOD: LIPASE: 22 U/L (ref 11–51)

## 2015-07-09 MED ORDER — ONDANSETRON HCL 4 MG/2ML IJ SOLN
4.0000 mg | Freq: Once | INTRAMUSCULAR | Status: AC
  Administered 2015-07-09: 4 mg via INTRAVENOUS
  Filled 2015-07-09: qty 2

## 2015-07-09 MED ORDER — FAMOTIDINE IN NACL 20-0.9 MG/50ML-% IV SOLN
20.0000 mg | Freq: Once | INTRAVENOUS | Status: AC
  Administered 2015-07-09: 20 mg via INTRAVENOUS
  Filled 2015-07-09: qty 50

## 2015-07-09 MED ORDER — KETOROLAC TROMETHAMINE 30 MG/ML IJ SOLN
30.0000 mg | Freq: Once | INTRAMUSCULAR | Status: AC
  Administered 2015-07-09: 30 mg via INTRAVENOUS
  Filled 2015-07-09: qty 1

## 2015-07-09 NOTE — ED Notes (Signed)
Pt's girlfriend visiting extremely verbally  aggressive to staff, shouting and cursing . Reporting that pt has not been treated for his symptoms. This RN explained that pt was medicated with pain medicine, nausea medicine and Pepcid IV. Charge Nurse made aware, at bedside. Misty StanleyLisa, [place] made aware and is working on discharging patient. No meds prescribed nor ordered at this time.

## 2015-07-09 NOTE — ED Provider Notes (Signed)
CSN: [id number]     Arrival date & time 07/09/15  1054 History   First MD Initiated Contact with Patient 07/09/15 1233     Chief Complaint  Patient presents with  . Abdominal Pain  . Emesis  . Knee Pain     (Consider location/radiation/quality/duration/timing/severity/associated sxs/prior Treatment) Patient is a 30 y.o. male presenting with abdominal pain, vomiting, and knee pain. The history is provided by the patient.  Abdominal Pain Associated symptoms: nausea and vomiting   Emesis Associated symptoms: abdominal pain   Knee Pain   30 year old male with history of anxiety, anemia, depression, bipolar disorder, constipation, cirrhosis, presenting to the ED for abdominal pain, nausea, and vomiting.  Patient has a history of same. He has had workup with GI including recent CT scan and gastric emptying study which were both normal.  He reports he has continued having intermittent episodes. His abdominal pain is mostly epigastric in nature. He reports associated nausea and vomiting multiple times in the past 24 hours. Denies any chest pain or shortness of breath. No fever or chills. He states his primary care physician has him on Phenergan and Tylenol No. 3 which do not be helping with his symptoms. Patient is status post cholecystectomy.    Past Medical History  Diagnosis Date  . Medical history non-contributory   . Anxiety   . Panic attack as reaction to stress   . Anemia   . Depression   . Gallstones   . Dysrhythmia     irregular heart rate - due to zoloft  . Bipolar disorder (HCC)   . Cirrhosis (HCC)   . Constipation    Past Surgical History  Procedure Laterality Date  . Cholecystectomy    . Tonsillectomy    . Incise and drain abcess    . Mouth surgery    . Esophagogastroduodenoscopy N/A 05/22/2015    Procedure: ESOPHAGOGASTRODUODENOSCOPY (EGD);  Surgeon: Vida Rigger, MD;  Location: [facility]COPY;  Service: Endoscopy;  Laterality: N/A;   Family History  Problem Relation Age  of Onset  . Irritable bowel syndrome Mother   . Kidney disease Mother   . Ulcerative colitis Maternal Grandfather   . Colon cancer Neg Hx   . Colon polyps Neg Hx   . Gallbladder disease Neg Hx   . Esophageal cancer Neg Hx   . Diabetes Neg Hx    Social History  Substance Use Topics  . Smoking status: Never Smoker   . Smokeless tobacco: Never Used  . Alcohol Use: No    Review of Systems  Gastrointestinal: Positive for nausea, vomiting and abdominal pain.  All other systems reviewed and are negative.     Allergies  Tramadol and Zoloft  Home Medications   Prior to Admission medications   Medication Sig Start Date End Date Taking? Authorizing Provider  acetaminophen-codeine (TYLENOL #3) 300-30 MG tablet Take 1 tablet by mouth every 6 (six) hours as needed for moderate pain. Refill on or after 06/23/2015 06/14/15  Yes Josalyn Funches, MD  clidinium-chlordiazePOXIDE (LIBRAX) 5-2.5 MG capsule Take 1 capsule by mouth daily.   Yes Historical Provider, MD  clonazePAM (KLONOPIN) 0.5 MG tablet Take 1 tablet (0.5 mg total) by mouth 2 (two) times daily. 05/29/15  Yes Josalyn Funches, MD  diclofenac sodium (VOLTAREN) 1 % GEL Apply 2 g topically daily as needed (joint pain).   Yes Historical Provider, MD  diphenhydrAMINE (BENADRYL) 25 mg capsule Take 50 mg by mouth at bedtime as needed for sleep.   Yes Historical Provider,  MD  ibuprofen (ADVIL,MOTRIN) 200 MG tablet Take 400-800 mg by mouth every 6 (six) hours as needed for moderate pain.    Yes Historical Provider, MD  Linaclotide Karlene Einstein) 145 MCG CAPS capsule Take 2 capsules (290 mcg total) by mouth daily. Dr. Ewing Schlein 05/29/15  Yes Dessa Phi, MD  ondansetron (ZOFRAN ODT) 4 MG disintegrating tablet Take 1 tablet (4 mg total) by mouth every 8 (eight) hours as needed for nausea or vomiting. 06/21/15  Yes Barrett Henle, PA-C  oxyCODONE-acetaminophen (PERCOCET) 5-325 MG tablet Take 1 tablet by mouth every 4 (four) hours as  needed. Patient taking differently: Take 1 tablet by mouth every 4 (four) hours as needed for moderate pain or severe pain.  07/03/15  Yes Bethann Berkshire, MD  promethazine (PHENERGAN) 25 MG tablet Take 1 tablet (25 mg total) by mouth every 8 (eight) hours as needed for nausea or vomiting. 05/29/15  Yes Josalyn Funches, MD  ibuprofen (ADVIL,MOTRIN) 800 MG tablet Take 1 tablet (800 mg total) by mouth 3 (three) times daily. Patient not taking: Reported on 07/09/2015 06/13/15   Samantha Tripp Dowless, PA-C   BP 117/86 mmHg  Pulse 108  Temp(Src) 98.2 F (36.8 C) (Oral)  Resp 22  SpO2 100%   Physical Exam  Constitutional: He is oriented to person, place, and time. He appears well-developed and well-nourished. No distress.  texting on cell phone, moaning loudly  HENT:  Head: Normocephalic and atraumatic.  Mouth/Throat: Oropharynx is clear and moist.  Eyes: Conjunctivae and EOM are normal. Pupils are equal, round, and reactive to light.  Neck: Normal range of motion. Neck supple.  Cardiovascular: Normal rate, regular rhythm and normal heart sounds.   Pulmonary/Chest: Effort normal and breath sounds normal. No respiratory distress. He has no wheezes.  Abdominal: Soft. Bowel sounds are normal. There is tenderness in the epigastric area. There is no guarding.  Musculoskeletal: Normal range of motion.  Neurological: He is alert and oriented to person, place, and time.  Skin: Skin is warm and dry. He is not diaphoretic.  Psychiatric: He has a normal mood and affect.  Nursing note and vitals reviewed.   ED Course  Procedures (including critical care time) Labs Review Labs Reviewed  COMPREHENSIVE METABOLIC PANEL - Abnormal; Notable for the following:    Glucose, Bld 103 (*)    AST 14 (*)    ALT 10 (*)    All other components within normal limits  CBC - Abnormal; Notable for the following:    WBC 11.6 (*)    Hemoglobin 11.4 (*)    HCT 36.2 (*)    MCH 24.6 (*)    RDW 16.3 (*)    All other  components within normal limits  URINALYSIS, ROUTINE W REFLEX MICROSCOPIC (NOT AT [facility]) - Abnormal; Notable for the following:    APPearance CLOUDY (*)    All other components within normal limits  LIPASE, BLOOD    Imaging Review No results found. I have personally reviewed and evaluated these images and lab results as part of my medical decision-making.   EKG Interpretation None      MDM   Final diagnoses:  Abdominal pain, unspecified abdominal location   30 year old male here with abdominal pain he has a history of same with negative evaluations thus far. Patient is afebrile and nontoxic. When i view patient from hallway, he is lying in bed comfortably but begins moaning loudly once i enter his room.  He continues texting on cell phone during exam and does  not appear to be in any significant distress.  He has some apparent tenderness in his epigastrium, remainder of exam is benign. Lab work is reassuring.  Patient was treated with Toradol, Zofran, and Pepcid without improvement in his symptoms. Patient's girlfriend has now arrived at the bedside and is demanding patient received narcotic pain medications.  On chart review patient has been seen multiple times for the same with negative workups.  He does have a history of becoming aggressive when he is denied further narcotics.  He is already on Tylenol No. 3 and Phenergan at home for his symptoms. I do not feel further narcotic medications are indicated at this time. Patient we discharged home to follow-up with his GI physician.  Discussed plan with patient, he/she acknowledged understanding and agreed with plan of care.  Return precautions given for new or worsening symptoms.  Of note, triage note reports knee pain however patient made no mention of this to me during ED visit.  Garlon HatchetLisa M Sanders, PA-C 07/09/15 1534  Leta BaptistEmily Roe Nguyen, MD 07/10/15 [phone]

## 2015-07-09 NOTE — ED Notes (Signed)
I was in patients chart due to him wanting to speak to Clydie BraunKaren Rash about a issue he has as a patient, he was transferred to her voicemail.

## 2015-07-09 NOTE — Discharge Instructions (Signed)
Follow-up with Dr. Ewing SchleinMagod as soon as possible. May continue your home tylenol #3 and phenergan. Return to the ED for new or worsening symptoms.

## 2015-07-09 NOTE — ED Notes (Signed)
Pt presents with c/o abdominal pain and vomiting that started over a month ago. Pt reports he has been seen for the same, diagnosis unknown. Pt reports he has vomited 30 x in the past 24 hours. Pt also left knee pain, reports it has been hurting "for a while".

## 2015-07-10 ENCOUNTER — Ambulatory Visit: Payer: No Typology Code available for payment source | Attending: Family Medicine

## 2015-07-10 DIAGNOSIS — R109 Unspecified abdominal pain: Principal | ICD-10-CM

## 2015-07-10 DIAGNOSIS — G8929 Other chronic pain: Secondary | ICD-10-CM

## 2015-07-10 LAB — IRON AND TIBC
%SAT: 7 % — ABNORMAL LOW (ref 15–60)
Iron: 23 ug/dL — ABNORMAL LOW (ref 50–195)
TIBC: 349 ug/dL (ref 250–425)
UIBC: 326 ug/dL (ref 125–400)

## 2015-07-10 LAB — FERRITIN: Ferritin: 19 ng/mL — ABNORMAL LOW (ref 22–322)

## 2015-07-12 ENCOUNTER — Other Ambulatory Visit: Payer: Self-pay | Admitting: Family Medicine

## 2015-07-12 DIAGNOSIS — D509 Iron deficiency anemia, unspecified: Secondary | ICD-10-CM

## 2015-07-12 MED ORDER — FERROUS SULFATE 325 (65 FE) MG PO TABS: 325.0000 mg | ORAL_TABLET | Freq: Two times a day (BID) | ORAL | Status: DC

## 2015-07-13 ENCOUNTER — Telehealth: Payer: Self-pay | Admitting: *Deleted

## 2015-07-13 ENCOUNTER — Emergency Department (HOSPITAL_COMMUNITY)
Admission: EM | Admit: 2015-07-13 | Discharge: 2015-07-13 | Disposition: A | Discharge status: Home / Self Care | Payer: No Typology Code available for payment source | Attending: Physician Assistant | Admitting: Physician Assistant

## 2015-07-13 ENCOUNTER — Encounter (HOSPITAL_COMMUNITY): Payer: Self-pay | Admitting: *Deleted

## 2015-07-13 DIAGNOSIS — D649 Anemia, unspecified: Secondary | ICD-10-CM | POA: Insufficient documentation

## 2015-07-13 DIAGNOSIS — F319 Bipolar disorder, unspecified: Secondary | ICD-10-CM | POA: Insufficient documentation

## 2015-07-13 DIAGNOSIS — K5900 Constipation, unspecified: Secondary | ICD-10-CM | POA: Insufficient documentation

## 2015-07-13 DIAGNOSIS — F410 Panic disorder [episodic paroxysmal anxiety] without agoraphobia: Secondary | ICD-10-CM | POA: Insufficient documentation

## 2015-07-13 DIAGNOSIS — G8929 Other chronic pain: Secondary | ICD-10-CM | POA: Insufficient documentation

## 2015-07-13 DIAGNOSIS — Z9049 Acquired absence of other specified parts of digestive tract: Secondary | ICD-10-CM | POA: Insufficient documentation

## 2015-07-13 DIAGNOSIS — Z79899 Other long term (current) drug therapy: Secondary | ICD-10-CM | POA: Insufficient documentation

## 2015-07-13 DIAGNOSIS — R109 Unspecified abdominal pain: Secondary | ICD-10-CM

## 2015-07-13 DIAGNOSIS — R112 Nausea with vomiting, unspecified: Secondary | ICD-10-CM | POA: Insufficient documentation

## 2015-07-13 DIAGNOSIS — Z8679 Personal history of other diseases of the circulatory system: Secondary | ICD-10-CM | POA: Insufficient documentation

## 2015-07-13 DIAGNOSIS — R1084 Generalized abdominal pain: Secondary | ICD-10-CM | POA: Insufficient documentation

## 2015-07-13 LAB — COMPREHENSIVE METABOLIC PANEL
ALBUMIN: 4.3 g/dL (ref 3.5–5.0)
ALK PHOS: 67 U/L (ref 38–126)
ALT: 11 U/L — AB (ref 17–63)
ANION GAP: 11 (ref 5–15)
AST: 16 U/L (ref 15–41)
BILIRUBIN TOTAL: 0.5 mg/dL (ref 0.3–1.2)
BUN: 15 mg/dL (ref 6–20)
CALCIUM: 9.6 mg/dL (ref 8.9–10.3)
CO2: 24 mmol/L (ref 22–32)
CREATININE: 0.92 mg/dL (ref 0.61–1.24)
Chloride: 108 mmol/L (ref 101–111)
GFR calc non Af Amer: >60 mL/min (ref >60)
GLUCOSE: 108 mg/dL — AB (ref 65–99)
Potassium: 3.3 mmol/L — ABNORMAL LOW (ref 3.5–5.1)
SODIUM: 143 mmol/L (ref 135–145)
TOTAL PROTEIN: 7.5 g/dL (ref 6.5–8.1)

## 2015-07-13 LAB — CBC
HCT: 37.7 % — ABNORMAL LOW (ref 39.0–52.0)
HEMOGLOBIN: 12.3 g/dL — AB (ref 13.0–17.0)
MCH: 25.1 pg — AB (ref 26.0–34.0)
MCHC: 32.6 g/dL (ref 30.0–36.0)
MCV: 76.8 fL — ABNORMAL LOW (ref 78.0–100.0)
PLATELETS: 314 10*3/uL (ref 150–400)
RBC: 4.91 MIL/uL (ref 4.22–5.81)
RDW: 16.1 % — AB (ref 11.5–15.5)
WBC: 11 10*3/uL — ABNORMAL HIGH (ref 4.0–10.5)

## 2015-07-13 LAB — URINE MICROSCOPIC-ADD ON: RBC / HPF: NONE SEEN RBC/hpf (ref 0–5)

## 2015-07-13 LAB — LIPASE, BLOOD: Lipase: 17 U/L (ref 11–51)

## 2015-07-13 LAB — URINALYSIS, ROUTINE W REFLEX MICROSCOPIC
GLUCOSE, UA: NEGATIVE mg/dL
HGB URINE DIPSTICK: NEGATIVE
Ketones, ur: 40 mg/dL — AB
Leukocytes, UA: NEGATIVE
Nitrite: NEGATIVE
PROTEIN: 30 mg/dL — AB
SPECIFIC GRAVITY, URINE: 1.027 (ref 1.005–1.030)
pH: 6 (ref 5.0–8.0)

## 2015-07-13 MED ORDER — PROMETHAZINE HCL 25 MG/ML IJ SOLN
12.5000 mg | Freq: Once | INTRAMUSCULAR | Status: AC
  Administered 2015-07-13: 12.5 mg via INTRAVENOUS
  Filled 2015-07-13: qty 1

## 2015-07-13 MED ORDER — LORAZEPAM 2 MG/ML IJ SOLN
0.5000 mg | Freq: Once | INTRAMUSCULAR | Status: AC
  Administered 2015-07-13: 0.5 mg via INTRAVENOUS
  Filled 2015-07-13: qty 1

## 2015-07-13 MED ORDER — OXYCODONE-ACETAMINOPHEN 5-325 MG PO TABS
1.0000 | ORAL_TABLET | Freq: Once | ORAL | Status: AC
Start: 2015-07-13 — End: 2015-07-13
  Administered 2015-07-13: 1 via ORAL
  Filled 2015-07-13: qty 1

## 2015-07-13 MED ORDER — SODIUM CHLORIDE 0.9 % IV BOLUS (SEPSIS)
1000.0000 mL | Freq: Once | INTRAVENOUS | Status: AC
  Administered 2015-07-13: 1000 mL via INTRAVENOUS

## 2015-07-13 MED ORDER — ONDANSETRON HCL 4 MG/2ML IJ SOLN
4.0000 mg | Freq: Once | INTRAMUSCULAR | Status: AC
  Administered 2015-07-13: 4 mg via INTRAVENOUS
  Filled 2015-07-13: qty 2

## 2015-07-13 MED ORDER — PROMETHAZINE HCL 25 MG PO TABS: 25.0000 mg | ORAL_TABLET | Freq: Four times a day (QID) | ORAL | Status: DC | PRN

## 2015-07-13 MED ORDER — HALOPERIDOL LACTATE 5 MG/ML IJ SOLN
2.0000 mg | Freq: Once | INTRAMUSCULAR | Status: AC
  Administered 2015-07-13: 2 mg via INTRAVENOUS
  Filled 2015-07-13: qty 1

## 2015-07-13 MED FILL — FERROUS SULFATE 325 MG TAB: 325 (65 FE) | 30 days supply | Qty: 60 | Fill #0

## 2015-07-13 NOTE — Discharge Instructions (Signed)
Call [facility] today to scheudle follow up.  Return with fever. Or concerns.

## 2015-07-13 NOTE — ED Notes (Signed)
Pt dry heaving after PO challenge. MD made aware. 

## 2015-07-13 NOTE — Telephone Encounter (Signed)
Unable to contact Pt  "not accepting call at this moment   Communication letter send to Pt

## 2015-07-13 NOTE — Telephone Encounter (Signed)
-----   Message from Dessa PhiJosalyn Funches, MD sent at 07/12/2015  9:42 PM EST ----- Iron levels are low  Iron supplement ordered  This may be cause some hard and dark stools if so please let me know

## 2015-07-13 NOTE — ED Notes (Signed)
Pt reports n/v for over a week. Denies diarrhea. Has abd pain, denies urinary symptoms or fever, but reports headache.

## 2015-07-13 NOTE — ED Provider Notes (Signed)
CSN: [id number]     Arrival date & time 07/13/15  0746 History   First MD Initiated Contact with Patient 07/13/15 [phone]     Chief Complaint  Patient presents with  . Emesis     (Consider location/radiation/quality/duration/timing/severity/associated sxs/prior Treatment) HPI  Patient's age 30 year old male presenting with chronic abdominal pain. Patient has history of anxiety, bipolar, panic disorder. Patient has had escalating abdominal pain. Patient reports that the abdominal pain is going on for the last 7 years. In the last 6 months patient has had 12 visits to the emergency department for this abdominal pain. Patient had endoscopy showing esophageal candidiasis in November which was treated with nystatin. Patient reports the symptoms of chest pain resolved after that. Patient had normal gastric emptying study completed. Patient followed by [facility].  Patient has been unable to cope with symptoms at home and comes emergency Department  today, for help with symptoms. No new symptoms. Abdominal pain nausea vomiting and are baseline as they have been for the last year.     Past Medical History  Diagnosis Date  . Medical history non-contributory   . Anxiety   . Panic attack as reaction to stress   . Anemia   . Depression   . Gallstones   . Dysrhythmia     irregular heart rate - due to zoloft  . Bipolar disorder (HCC)   . Cirrhosis (HCC)   . Constipation    Past Surgical History  Procedure Laterality Date  . Cholecystectomy    . Tonsillectomy    . Incise and drain abcess    . Mouth surgery    . Esophagogastroduodenoscopy N/A 05/22/2015    Procedure: ESOPHAGOGASTRODUODENOSCOPY (EGD);  Surgeon: Vida Rigger, MD;  Location: [facility] ENDOSCOPY;  Service: Endoscopy;  Laterality: N/A;   Family History  Problem Relation Age of Onset  . Irritable bowel syndrome Mother   . Kidney disease Mother   . Ulcerative colitis Maternal Grandfather   . Colon cancer Neg Hx   . Colon polyps Neg Hx   .  Gallbladder disease Neg Hx   . Esophageal cancer Neg Hx   . Diabetes Neg Hx    Social History  Substance Use Topics  . Smoking status: Never Smoker   . Smokeless tobacco: Never Used  . Alcohol Use: No    Review of Systems  Constitutional: Negative for fever, activity change and fatigue.  Respiratory: Negative for shortness of breath.   Cardiovascular: Negative for chest pain.  Gastrointestinal: Positive for nausea, vomiting and abdominal pain. Negative for diarrhea.  Neurological: Negative for dizziness.  Psychiatric/Behavioral: Negative for confusion.      Allergies  Reglan; Toradol; Tramadol; and Zoloft  Home Medications   Prior to Admission medications   Medication Sig Start Date End Date Taking? Authorizing Provider  acetaminophen-codeine (TYLENOL #3) 300-30 MG tablet Take 1 tablet by mouth every 6 (six) hours as needed for moderate pain. Refill on or after 06/23/2015 06/14/15  Yes Josalyn Funches, MD  clonazePAM (KLONOPIN) 0.5 MG tablet Take 1 tablet (0.5 mg total) by mouth 2 (two) times daily. 05/29/15  Yes Josalyn Funches, MD  diclofenac sodium (VOLTAREN) 1 % GEL Apply 2 g topically daily as needed (joint pain).   Yes Historical Provider, MD  diphenhydrAMINE (BENADRYL) 25 mg capsule Take 50 mg by mouth at bedtime as needed for sleep.   Yes Historical Provider, MD  hyoscyamine (LEVSIN SL) 0.125 MG SL tablet Place 0.125 mg under the tongue every 4 (four) hours as needed  for cramping.   Yes Historical Provider, MD  ibuprofen (ADVIL,MOTRIN) 200 MG tablet Take 400-800 mg by mouth every 6 (six) hours as needed for moderate pain.    Yes Historical Provider, MD  Linaclotide Karlene Einstein(LINZESS) 145 MCG CAPS capsule Take 2 capsules (290 mcg total) by mouth daily. Dr. Ewing SchleinMagod 05/29/15  Yes Dessa PhiJosalyn Funches, MD  promethazine (PHENERGAN) 25 MG tablet Take 1 tablet (25 mg total) by mouth every 8 (eight) hours as needed for nausea or vomiting. 05/29/15  Yes Josalyn Funches, MD   clidinium-chlordiazePOXIDE (LIBRAX) 5-2.5 MG capsule Take 1 capsule by mouth daily.    Historical Provider, MD  ferrous sulfate 325 (65 FE) MG tablet Take 1 tablet (325 mg total) by mouth 2 (two) times daily with a meal. Patient not taking: Reported on 07/13/2015 07/12/15   Dessa PhiJosalyn Funches, MD  promethazine (PHENERGAN) 25 MG tablet Take 1 tablet (25 mg total) by mouth every 6 (six) hours as needed for nausea or vomiting. 07/13/15   Khalie Wince Lyn Jovahn Breit, MD   BP 134/90 mmHg  Pulse 70  Temp(Src) 97.9 F (36.6 C) (Oral)  Resp 18  SpO2 100% Physical Exam  Constitutional: He is oriented to person, place, and time. He appears well-nourished.  HENT:  Head: Normocephalic.  Mouth/Throat: Oropharynx is clear and moist.  Eyes: Conjunctivae are normal.  Neck: No tracheal deviation present.  Cardiovascular: Normal rate.   Pulmonary/Chest: Effort normal. No stridor. No respiratory distress.  Abdominal: Soft. There is tenderness. There is no guarding.  Patient has diffuse abdominal tenderness he reports this is baseline.  Musculoskeletal: Normal range of motion. He exhibits no edema.  Neurological: He is oriented to person, place, and time. No cranial nerve deficit.  Skin: Skin is warm and dry. No rash noted. He is not diaphoretic.  Psychiatric: He has a normal mood and affect. His behavior is normal.  Nursing note and vitals reviewed.   ED Course  Procedures (including critical care time) Labs Review Labs Reviewed  COMPREHENSIVE METABOLIC PANEL - Abnormal; Notable for the following:    Potassium 3.3 (*)    Glucose, Bld 108 (*)    ALT 11 (*)    All other components within normal limits  CBC - Abnormal; Notable for the following:    WBC 11.0 (*)    Hemoglobin 12.3 (*)    HCT 37.7 (*)    MCV 76.8 (*)    MCH 25.1 (*)    RDW 16.1 (*)    All other components within normal limits  URINALYSIS, ROUTINE W REFLEX MICROSCOPIC (NOT AT [facility]) - Abnormal; Notable for the following:    Color, Urine  AMBER (*)    APPearance HAZY (*)    Bilirubin Urine SMALL (*)    Ketones, ur 40 (*)    Protein, ur 30 (*)    All other components within normal limits  URINE MICROSCOPIC-ADD ON - Abnormal; Notable for the following:    Squamous Epithelial / LPF 0-5 (*)    Bacteria, UA FEW (*)    Crystals CA OXALATE CRYSTALS (*)    All other components within normal limits  LIPASE, BLOOD    Imaging Review No results found. I have personally reviewed and evaluated these images and lab results as part of my medical decision-making.   EKG Interpretation None      MDM   Final diagnoses:  Chronic abdominal pain    Patient's age 30 year old male with extensive chronic abdominal pain history.  EGD showing esophageal candidiasis within the last  2 months. Negative gastric emptying setting. Followed by [facility]. Patient's had this issue for over 7 years. Patient has a dull pain and tenderness is unchanged from prior. Discussed with patient with the goals of this visit were. We decided to help with the symptoms. Patient does endorse he still smokes marijuana but reports that despite cutting down and is not helped his chronic abdominal pain nausea vomiting. We will try Haldol and Phenergan today. Give fluids. I do not think patient requires any advanced imaging given the chronicity of this complaint.   Patient felt better after initial intervention of haldol phenegran.    Tried po graham cracker, had one episode of dry heaving.  Given percocet and ativan.    Much improved. Tolerating PO.  Will discharge home.     Jahquez Steffler Randall An, MD 07/14/15 [phone]

## 2015-07-16 MED FILL — ?LINZESS 290 MCG CAPSULE: 290 | 90 days supply | Qty: 90 | Fill #2

## 2015-07-18 ENCOUNTER — Telehealth: Payer: Self-pay | Admitting: Family Medicine

## 2015-07-18 DIAGNOSIS — R109 Unspecified abdominal pain: Principal | ICD-10-CM

## 2015-07-18 DIAGNOSIS — G8929 Other chronic pain: Secondary | ICD-10-CM

## 2015-07-18 NOTE — Telephone Encounter (Signed)
Patient called requesting a medication refill for Tylenol 3, promethzine and klonopin Please follow up with patient.

## 2015-07-19 MED ORDER — ACETAMINOPHEN-CODEINE #3 300-30 MG PO TABS: 1.0000 | ORAL_TABLET | Freq: Four times a day (QID) | ORAL | Status: DC | PRN

## 2015-07-19 MED ORDER — PROMETHAZINE HCL 25 MG PO TABS: 25.0000 mg | ORAL_TABLET | Freq: Three times a day (TID) | ORAL | Status: DC | PRN

## 2015-07-19 MED FILL — PROMETHAZINE 25 MG TABLET: 25 | 20 days supply | Qty: 60 | Fill #0

## 2015-07-19 NOTE — Telephone Encounter (Signed)
Pt notified Rx at front office  Pt stated Klonopin not helping

## 2015-07-19 NOTE — Telephone Encounter (Signed)
Phenergan and tylenol #3 refilled Tylenol #3 up front for pick up  Klonopin has 2 refills placed on Rx on 05/29/2015, his next refill is due on 08/28/2014

## 2015-07-19 NOTE — Telephone Encounter (Signed)
Patient to schedule OV to discuss  No changes at this time

## 2015-07-20 MED FILL — ACETAMINOPHEN/COD #3 TABLET: 300-30 | 30 days supply | Qty: 120 | Fill #0

## 2015-07-23 NOTE — Telephone Encounter (Signed)
Pt aware.

## 2015-08-01 ENCOUNTER — Telehealth: Payer: Self-pay

## 2015-08-01 NOTE — Telephone Encounter (Signed)
Nurse called patient, patient verified date of birth. Patient explains bottom right jaw is hurting, pain is radiating towards ear, has horrible headache. Patient reports jaw does not look swollen. Patient reports ibuprofen and tylenol 3 is not helping with pain.  Patient started taking old prescription of penicillin vk , following instructions on bottle as follows: take 1 by mouth four times a day.  Nurse scheduled patient for appointment on Friday, August 03, 2015.

## 2015-08-03 ENCOUNTER — Ambulatory Visit: Payer: Self-pay

## 2015-08-06 ENCOUNTER — Encounter: Payer: Self-pay | Admitting: Family Medicine

## 2015-08-06 ENCOUNTER — Ambulatory Visit: Payer: No Typology Code available for payment source | Attending: Family Medicine | Admitting: Family Medicine

## 2015-08-06 ENCOUNTER — Encounter (HOSPITAL_BASED_OUTPATIENT_CLINIC_OR_DEPARTMENT_OTHER): Payer: No Typology Code available for payment source | Admitting: Clinical

## 2015-08-06 VITALS — BP 109/69 | HR 89 | Temp 98.2°F | Resp 16 | Ht 67.0 in | Wt 140.0 lb

## 2015-08-06 DIAGNOSIS — F329 Major depressive disorder, single episode, unspecified: Secondary | ICD-10-CM

## 2015-08-06 DIAGNOSIS — F419 Anxiety disorder, unspecified: Principal | ICD-10-CM

## 2015-08-06 DIAGNOSIS — G8929 Other chronic pain: Secondary | ICD-10-CM | POA: Insufficient documentation

## 2015-08-06 DIAGNOSIS — R112 Nausea with vomiting, unspecified: Secondary | ICD-10-CM | POA: Insufficient documentation

## 2015-08-06 DIAGNOSIS — S025XXA Fracture of tooth (traumatic), initial encounter for closed fracture: Secondary | ICD-10-CM | POA: Insufficient documentation

## 2015-08-06 DIAGNOSIS — F418 Other specified anxiety disorders: Secondary | ICD-10-CM

## 2015-08-06 DIAGNOSIS — F410 Panic disorder [episodic paroxysmal anxiety] without agoraphobia: Secondary | ICD-10-CM | POA: Insufficient documentation

## 2015-08-06 DIAGNOSIS — K0889 Other specified disorders of teeth and supporting structures: Secondary | ICD-10-CM

## 2015-08-06 DIAGNOSIS — Z0000 Encounter for general adult medical examination without abnormal findings: Secondary | ICD-10-CM

## 2015-08-06 DIAGNOSIS — R1013 Epigastric pain: Secondary | ICD-10-CM | POA: Insufficient documentation

## 2015-08-06 DIAGNOSIS — Z79899 Other long term (current) drug therapy: Secondary | ICD-10-CM | POA: Insufficient documentation

## 2015-08-06 DIAGNOSIS — F32A Depression, unspecified: Secondary | ICD-10-CM

## 2015-08-06 LAB — POCT GLYCOSYLATED HEMOGLOBIN (HGB A1C): HEMOGLOBIN A1C: 5.8

## 2015-08-06 MED ORDER — CHLORHEXIDINE GLUCONATE 0.12% ORAL RINSE (MEDLINE KIT): 15.0000 mL | Freq: Two times a day (BID) | OROMUCOSAL | Status: DC

## 2015-08-06 MED ORDER — CLONAZEPAM 1 MG PO TABS: 1.0000 mg | ORAL_TABLET | Freq: Two times a day (BID) | ORAL | Status: DC

## 2015-08-06 NOTE — Progress Notes (Signed)
ASSESSMENT: Pt currently experiencing symptoms of anxiety and depression, needs to f/u with PCP and [facility], as well as referral to psychiatry; pt may benefit from psychoeducation and Solution-Focused therapy to cope with symptoms of anxiety and depression, as well as coping with pain contributing to anxiety and depression. Stage of Change: precontemplative  PLAN: 1. F/U with behavioral health consultant in one month 2. Psychiatric Medications: Klonopin 3. Behavioral recommendation(s):   -Consider focusing on controllable stressors -Consider reading educational material regarding coping with symptoms of anxiety and depression SUBJECTIVE: Pt. referred by Dr Armen Pickup for symptoms of anxiety and depression:  Pt. reports the following symptoms/concerns: Pt states that he does not understand why "nobody will help me", and says that he is not drug-seeking, and resents being perceived as someone drug-seeking, that he is in legitimate pain. Pt states that he is nauseous daily, vomiting often, and does not attribute it to marijuana use.  Duration of problem: At least one month (increase) Severity: severe  OBJECTIVE: Orientation & Cognition: Oriented x3. Thought processes normal and appropriate to situation. Mood: appropriate. Affect: appropriate Appearance: appropriate Risk of harm to self or others: no known risk of harm to self or others today Substance use: cannabis Assessments administered: PHQ9: 24/ GAD7: 21  Diagnosis: Anxiety and depression CPT Code: F41.8 -------------------------------------------- Other(s) present in the room: none  Time spent with patient in exam room: 16 minutes, 12:40pm-1:56pm

## 2015-08-06 NOTE — Progress Notes (Signed)
Subjective:  Patient ID: Levi Palmer, male    DOB: 1986-07-01  Age: 30 y.o. MRN: [id number]  CC: Medication Management   HPI Levi Palmer presents for    1. Anxiety: he has worsening of chronic anxiety with depression for the past two months. This coincides with worsening abdominal pain. He denies change in life situation or stressors. He request increase of klonopin. He has not sought mental health services at [facility] or [facility] but is willing to.  2. Abdominal pain: he has chronic abdominal pain. Current pain is epigastric. Sharp pain. He has intermittent nausea with emesis. No blood in emesis. He denies diarrhea, constipation and blood in stool. He admits to taking 4 ibuprofen about once weekly. Denies ETOH. He is still following closely with GI. He has normal CT enterogram on 06/28/2015.   3. Dental pain: this is chronic. R upper mostly. He has very poor dentition. Many broken teeth. No oral swelling or jaw pain currently but he had some earlier in the month and took a few penicillin which resolved the pain.   4. Unemployed: patient currently unemployed. Requesting letter similar to last one stating why from a medical standpoint he is unable to work.   Social History  Substance Use Topics  . Smoking status: Never Smoker   . Smokeless tobacco: Never Used  . Alcohol Use: No    Outpatient Prescriptions Prior to Visit  Medication Sig Dispense Refill  . acetaminophen-codeine (TYLENOL #3) 300-30 MG tablet Take 1 tablet by mouth every 6 (six) hours as needed for moderate pain. Refill on or after 06/23/2015 120 tablet 0  . clonazePAM (KLONOPIN) 0.5 MG tablet Take 1 tablet (0.5 mg total) by mouth 2 (two) times daily. 60 tablet 2  . diphenhydrAMINE (BENADRYL) 25 mg capsule Take 50 mg by mouth at bedtime as needed for sleep.    . ferrous sulfate 325 (65 FE) MG tablet Take 1 tablet (325 mg total) by mouth 2 (two) times daily with a meal. 60 tablet 3  .  ibuprofen (ADVIL,MOTRIN) 200 MG tablet Take 400-800 mg by mouth every 6 (six) hours as needed for moderate pain.     . promethazine (PHENERGAN) 25 MG tablet Take 1 tablet (25 mg total) by mouth every 8 (eight) hours as needed for nausea or vomiting. 60 tablet 2  . clidinium-chlordiazePOXIDE (LIBRAX) 5-2.5 MG capsule Take 1 capsule by mouth daily. Reported on 08/06/2015    . diclofenac sodium (VOLTAREN) 1 % GEL Apply 2 g topically daily as needed (joint pain). Reported on 08/06/2015    . hyoscyamine (LEVSIN SL) 0.125 MG SL tablet Place 0.125 mg under the tongue every 4 (four) hours as needed for cramping. Reported on 08/06/2015    . Linaclotide (LINZESS) 145 MCG CAPS capsule Take 2 capsules (290 mcg total) by mouth daily. Dr. Ewing Schlein (Patient not taking: Reported on 08/06/2015) 90 capsule 3   No facility-administered medications prior to visit.    ROS Review of Systems  Constitutional: Negative for fever, chills, fatigue and unexpected weight change.  HENT: Positive for dental problem.   Eyes: Negative for visual disturbance.  Respiratory: Negative for cough and shortness of breath.   Cardiovascular: Negative for chest pain, palpitations and leg swelling.  Gastrointestinal: Positive for nausea, vomiting and abdominal pain. Negative for diarrhea, constipation and blood in stool.  Endocrine: Negative for polydipsia, polyphagia and polyuria.  Genitourinary: Negative for dysuria, urgency, frequency, hematuria, flank pain (R sided ), decreased urine volume,  discharge, penile swelling, scrotal swelling, genital sores, penile pain and testicular pain.  Musculoskeletal: Negative for myalgias, back pain, arthralgias, gait problem and neck pain.  Skin: Negative for rash.  Allergic/Immunologic: Negative for immunocompromised state.  Hematological: Negative for adenopathy. Does not bruise/bleed easily.  Psychiatric/Behavioral: Positive for suicidal ideas and dysphoric mood. Negative for sleep disturbance. The  patient is nervous/anxious.     Objective:  BP 109/69 mmHg  Pulse 89  Temp(Src) 98.2 F (36.8 C) (Oral)  Resp 16  Ht  (1.702 m)  Wt 140 lb (63.504 kg)  BMI 21.92 kg/m2  SpO2 100%  Wt Readings from Last 3 Encounters:  08/06/15 140 lb (63.504 kg)  07/03/15 140 lb (63.504 kg)  06/14/15 138 lb (62.596 kg)    BP/Weight 08/06/2015 07/13/2015 07/09/2015  Systolic BP 109 134 137  Diastolic BP 69 90 89  Wt. (Lbs) 140 - -  BMI 21.92 - -   Physical Exam  Constitutional: He appears well-developed and well-nourished. No distress.  HENT:  Head: Normocephalic and atraumatic.  Mouth/Throat: No oral lesions. Abnormal dentition. Dental caries present. No dental abscesses.  Multiple broken teeth   Neck: Normal range of motion. Neck supple.  Cardiovascular: Normal rate, regular rhythm, normal heart sounds and intact distal pulses.   Pulmonary/Chest: Effort normal and breath sounds normal.  Abdominal: Soft. Bowel sounds are normal. He exhibits no distension and no mass. There is no hepatosplenomegaly, splenomegaly or hepatomegaly. There is no tenderness. There is no rebound, no guarding and no CVA tenderness. No hernia. Hernia confirmed negative in the ventral area, confirmed negative in the right inguinal area and confirmed negative in the left inguinal area.  Genitourinary: Testes normal and penis normal. Right testis shows no mass and no swelling. Left testis shows no mass and no swelling. Uncircumcised.  Musculoskeletal: He exhibits no edema.  Neurological: He is alert.  Skin: Skin is warm and dry. No rash noted. No erythema.  Psychiatric: He exhibits a depressed mood.    Depression screen [facility] 2/9 08/06/2015 08/06/2015 06/14/2015  Decreased Interest 3 3 0  Down, Depressed, Hopeless 3 3 0  PHQ - 2 Score 6 6 0  Altered sleeping 3 3 -  Tired, decreased energy 3 3 -  Change in appetite 3 3 -  Feeling bad or failure about yourself  3 3 -  Trouble concentrating 3 3 -  Moving slowly or  fidgety/restless 3 2 -  Suicidal thoughts 3 1 -  PHQ-9 Score 27 24 -    GAD 7 : Generalized Anxiety Score 08/06/2015 08/06/2015 05/29/2015 05/11/2015  Nervous, Anxious, on Edge Control/stop worrying Worry too much - different things Trouble relaxing Restless 3 3 0 1  Easily annoyed or irritable Afraid - awful might happen Total GAD 7 Score Anxiety Difficulty - Not difficult at all - -      Assessment & Plan:   Rees was seen today for medication management.  Diagnoses and all orders for this visit:  Healthcare maintenance -     POCT glycosylated hemoglobin (Hb A1C)  Panic disorder  Pain, dental -     chlorhexidine gluconate (PERIDEX) 0.12 % solution; Use as directed 15 mLs in the mouth or throat 2 (two) times daily.  Anxiety -     clonazePAM (KLONOPIN) 1 MG  tablet; Take 1 tablet (1 mg total) by mouth 2 (two) times daily.   No orders of the defined types were placed in this encounter.    Follow-up: No Follow-up on file.   Dessa Phi MD

## 2015-08-06 NOTE — Assessment & Plan Note (Signed)
A: worsening anxiety in setting of worsening of chronic abdominal pain and depression P:  Increase klonopin to 1 mg BID  Referred to mental health

## 2015-08-06 NOTE — Assessment & Plan Note (Addendum)
Dental pain with periodonitis   Dental referral in place Oral antibiotic rinse preferred over antibiotics given hx of candida esophagitis

## 2015-08-06 NOTE — Patient Instructions (Addendum)
Ausar was seen today for medication management.  Diagnoses and all orders for this visit:  Healthcare maintenance -     POCT glycosylated hemoglobin (Hb A1C)  Panic disorder  Pain, dental -     chlorhexidine gluconate (PERIDEX) 0.12 % solution; Use as directed 15 mLs in the mouth or throat 2 (two) times daily.  Anxiety -     clonazePAM (KLONOPIN) 1 MG tablet; Take 1 tablet (1 mg total) by mouth 2 (two) times daily.   This is the latest dental referral, please call them  Sent Urgent Referral to [facility] ph. # [phone] [address] [place], [place] [address]  Depression, your depression screen has worsened. You are not currently on an antidepressant.  Please go to [facility] [place] to be evaluated for bipolar vs unipolar depression to help guide treatment.   F/u in 4 weeks  Dr. Armen Pickup

## 2015-08-06 NOTE — Progress Notes (Signed)
Medication management  Requesting increased on Klonopin  Pain scale 7-abdominal pain  No tobacco user  No suicidal thought in the past two weeks

## 2015-08-06 NOTE — Assessment & Plan Note (Signed)
A: decline. Not on antidepressant. Has hx of bipolar diagnosis. Gives hx of 3 day episode without sleep about one year ago. P: Psychology evaluation recommended to determine if patient would most benefit from antidepressant or mood stabilizer

## 2015-08-10 ENCOUNTER — Telehealth: Payer: Self-pay | Admitting: Family Medicine

## 2015-08-10 DIAGNOSIS — G8929 Other chronic pain: Secondary | ICD-10-CM

## 2015-08-10 DIAGNOSIS — R109 Unspecified abdominal pain: Principal | ICD-10-CM

## 2015-08-10 NOTE — Telephone Encounter (Signed)
Pt. Called requesting a med refill on Tylenol # 3. Please f/u with pt. °

## 2015-08-14 NOTE — Telephone Encounter (Signed)
Patient called checking on status of medication refill for Tylenol #3 please f/u

## 2015-08-17 MED ORDER — ACETAMINOPHEN-CODEINE #3 300-30 MG PO TABS: 1.0000 | ORAL_TABLET | Freq: Four times a day (QID) | ORAL | Status: DC | PRN

## 2015-08-17 MED FILL — PROMETHAZINE 25 MG TABLET: 25 | 20 days supply | Qty: 60 | Fill #1

## 2015-08-17 NOTE — Telephone Encounter (Signed)
Tylenol #3 ready for pick up   

## 2015-08-20 ENCOUNTER — Other Ambulatory Visit: Payer: Self-pay | Admitting: Family Medicine

## 2015-08-20 ENCOUNTER — Other Ambulatory Visit: Payer: Self-pay | Admitting: *Deleted

## 2015-08-20 ENCOUNTER — Ambulatory Visit: Payer: No Typology Code available for payment source | Attending: Family Medicine

## 2015-08-20 DIAGNOSIS — G8929 Other chronic pain: Secondary | ICD-10-CM

## 2015-08-20 DIAGNOSIS — D509 Iron deficiency anemia, unspecified: Secondary | ICD-10-CM

## 2015-08-20 DIAGNOSIS — R109 Unspecified abdominal pain: Principal | ICD-10-CM

## 2015-08-20 LAB — CBC
HCT: 39.7 % (ref 39.0–52.0)
Hemoglobin: 12.9 g/dL — ABNORMAL LOW (ref 13.0–17.0)
MCH: 25 pg — AB (ref 26.0–34.0)
MCHC: 32.5 g/dL (ref 30.0–36.0)
MCV: 76.9 fL — AB (ref 78.0–100.0)
MPV: 9.2 fL (ref 8.6–12.4)
PLATELETS: 390 10*3/uL (ref 150–400)
RBC: 5.16 MIL/uL (ref 4.22–5.81)
RDW: 16.6 % — AB (ref 11.5–15.5)
WBC: 11.1 10*3/uL — ABNORMAL HIGH (ref 4.0–10.5)

## 2015-08-20 LAB — VITAMIN B12: VITAMIN B 12: 872 pg/mL (ref 200–1100)

## 2015-08-20 LAB — FERRITIN: Ferritin: 23 ng/mL (ref 20–345)

## 2015-08-20 MED ORDER — ACETAMINOPHEN-CODEINE #3 300-30 MG PO TABS: 1.0000 | ORAL_TABLET | Freq: Four times a day (QID) | ORAL | Status: DC | PRN

## 2015-08-20 MED FILL — ACETAMINOPHEN/COD #3 TABLET: 300-30 | 30 days supply | Qty: 120 | Fill #0

## 2015-08-20 NOTE — Progress Notes (Signed)
Patient ID: Levi Palmer, male   DOB: [birth year], 30 y.o.   MRN: [id number]   Fax lab results to [phone] Dr. Vida Rigger

## 2015-08-21 ENCOUNTER — Encounter: Payer: Self-pay | Admitting: Family Medicine

## 2015-08-25 ENCOUNTER — Emergency Department (HOSPITAL_COMMUNITY)
Admission: EM | Admit: 2015-08-25 | Discharge: 2015-08-25 | Disposition: A | Discharge status: Home / Self Care | Payer: No Typology Code available for payment source | Attending: Emergency Medicine | Admitting: Emergency Medicine

## 2015-08-25 ENCOUNTER — Encounter (HOSPITAL_COMMUNITY): Payer: Self-pay | Admitting: *Deleted

## 2015-08-25 DIAGNOSIS — K029 Dental caries, unspecified: Secondary | ICD-10-CM | POA: Insufficient documentation

## 2015-08-25 DIAGNOSIS — F319 Bipolar disorder, unspecified: Secondary | ICD-10-CM | POA: Insufficient documentation

## 2015-08-25 DIAGNOSIS — K0889 Other specified disorders of teeth and supporting structures: Secondary | ICD-10-CM

## 2015-08-25 DIAGNOSIS — K002 Abnormalities of size and form of teeth: Secondary | ICD-10-CM | POA: Insufficient documentation

## 2015-08-25 DIAGNOSIS — Z8679 Personal history of other diseases of the circulatory system: Secondary | ICD-10-CM | POA: Insufficient documentation

## 2015-08-25 DIAGNOSIS — Z79899 Other long term (current) drug therapy: Secondary | ICD-10-CM | POA: Insufficient documentation

## 2015-08-25 DIAGNOSIS — F410 Panic disorder [episodic paroxysmal anxiety] without agoraphobia: Secondary | ICD-10-CM | POA: Insufficient documentation

## 2015-08-25 DIAGNOSIS — D649 Anemia, unspecified: Secondary | ICD-10-CM | POA: Insufficient documentation

## 2015-08-25 MED ORDER — BUPIVACAINE-EPINEPHRINE (PF) 0.5% -1:200000 IJ SOLN
1.8000 mL | Freq: Once | INTRAMUSCULAR | Status: AC
  Administered 2015-08-25: 1.8 mL

## 2015-08-25 MED ORDER — PENICILLIN V POTASSIUM 500 MG PO TABS
500.0000 mg | ORAL_TABLET | Freq: Four times a day (QID) | ORAL | Status: AC
Start: 2015-08-25 — End: 2015-09-01

## 2015-08-25 MED ORDER — NYSTATIN 100000 UNIT/ML MT SUSP: 500000.0000 [IU] | Freq: Four times a day (QID) | OROMUCOSAL | Status: DC

## 2015-08-25 MED ORDER — CHLORHEXIDINE GLUCONATE 0.12% ORAL RINSE (MEDLINE KIT): 15.0000 mL | Freq: Two times a day (BID) | OROMUCOSAL | Status: DC

## 2015-08-25 NOTE — ED Notes (Signed)
PT reports pain to teeth on RT side . Pain is 10/10

## 2015-08-25 NOTE — Discharge Instructions (Signed)
Please take your antibiotics as prescribed. Take all of your antibiotics and do not save or share them. You may use your mouthwash as prescribed. Use it at resource guide to help find a dentist or call your PCP for a referral.  [facility]  Community Service [facility]  [address]  [place], [place] [address]  Phone [phone]  [facility] [employer] [facility] in [place], [place], exemplifies the [employer] vision to improve the health and quality of life of all [place]ians by Public house manager with a passion to care for the underserved and by leading the nation in community-based, service learning oral health education. We are committed to offering comprehensive general dental services for adults, children and special needs patients in a safe, caring and professional setting.  Appointments: Our clinic is open Monday through Friday 8:00 a.m. until 5:00 p.m. The amount of time scheduled for an appointment depends on the patients specific needs. We ask that you keep your appointed time for care or provide 24-hour notice of all appointment changes. Parents or legal guardians must accompany minor children.  Payment for Services: Medicaid and other insurance plans are welcome. Payment for services is due when services are rendered and may be made by cash or credit card. If you have dental insurance, we will assist you with your claim submission.   Emergencies: Emergency services will be provided Monday through Friday on a walk-in basis. Please arrive early for emergency services. After hours emergency services will be provided for patients of record as required.  Services:  Comprehensive General Dentistry  Childrens Dentistry  Oral Surgery - Extractions  Root Canals  Sealants and Tooth Colored Fillings  Crowns and Bridges  Dentures and Partial Dentures   Implant Services  Periodontal Services and Agricultural engineer  3-D/[facility]   Emergency Department Resource Guide 1) Find a Doctor and Pay Out of Pocket Although you won't have to find out who is covered by your insurance plan, it is a good idea to ask around and get recommendations. You will then need to call the office and see if the doctor you have chosen will accept you as a new patient and what types of options they offer for patients who are self-pay. Some doctors offer discounts or will set up payment plans for their patients who do not have insurance, but you will need to ask so you aren't surprised when you get to your appointment.  2) Contact Your Local Health Department Not all health departments have doctors that can see patients for sick visits, but many do, so it is worth a call to see if yours does. If you don't know where your local health department is, you can check in your phone book. The CDC also has a tool to help you locate your state's health department, and many state websites also have listings of all of their local health departments.  3) Find a Walk-in Clinic If your illness is not likely to be very severe or complicated, you may want to try a walk in clinic. These are popping up all over the country in pharmacies, drugstores, and shopping centers. They're usually staffed by nurse practitioners or physician assistants that have been trained to treat common illnesses and complaints. They're usually fairly quick and inexpensive. However, if you have serious medical issues or chronic medical problems, these are probably not  your best option.  No Primary Care Doctor: - Call Health Connect at  [phone] - they can help you locate a primary care doctor that  accepts your insurance, provides certain services, etc. - Physician Referral Service- [phone]  Chronic Pain Problems: Organization         Address  Phone    Notes  [facility]  [phone] Patients need to be referred by their primary care doctor.   Medication Assistance: Organization         Address  Phone   Notes  [facility] Medication [facility] [address] [place] [place], [place] [address] [phone] --Must be a resident of [facility] -- Must have NO insurance coverage whatsoever (no Medicaid/ Medicare, etc.) -- The pt. MUST have a primary care doctor that directs their care regularly and follows them in the community   MedAssist  [phone]   [employer]  [phone]    Agencies that provide inexpensive medical care: Organization         Address  Phone   Notes  [facility]  [phone]   [facility]    [phone]   [facility] [address] [place], [place] [address] [phone]   [facility] [address] [place] [phone]   Planned Parenthood    [phone]   [facility]    [phone]   [facility]  [address], [place] Phone:  [phone], Fax:  [phone] Hours of Operation:  9 am - 6 pm, M-F.  Also accepts Medicaid/Medicare and self-pay.  [facility] for Children  [address], [place] Phone: [phone], Fax: [phone]. Hours of Operation:  8:30 am - 5:30 pm, M-F.  Also accepts Medicaid and self-pay.  [facility] [place] [address], [place] Phone: [phone]   [facility] [address] Natasha Bence [place], [place] [phone], Ext. [phone] Mondays & Thursdays: 7-9 AM.  First 15 patients are seen on a first come, first serve basis.    Medicaid-accepting [facility] Providers:  Organization         Address  Phone   Notes  [facility] [address], [place] [phone] Also accepts self-pay patients.  [facility]  [address] Laurell Josephs [place], [place]  [phone]   [facility] [address], [place] [phone]   [facility] [address], [place] [phone]   Renaye Rakers [address], [place]   [phone] Only accepts [place] Access [place] patients after they have their name applied to their card.   Self-Pay (no insurance) in [facility]:  Organization         Address  Phone   Notes  Sickle Cell Patients, [facility] [address] [place], [place] [phone]   [facility] [address] [place], [place] [phone]   [facility] [place]  [address], [place] [phone]   [facility]/Dr. Osei-Bonsu  [address], [place] or [address], [place] [phone] Phone number for both [place] and [place] locations is the same.  [facility] [address]  Dr, Ginette Otto [phone]   [facility] [address], [place] or [address] [phone]   [facility] [address] [place] [phone], phone; [phone], fax Sees patients 1st and 3rd Saturday of every month.  Must not qualify for public or private insurance (i.e. Medicaid, Medicare, [place] Health Choice, Veterans' Benefits)  Household income should be no more than 200% of the poverty level The clinic cannot treat you if you are pregnant or think you are pregnant  Sexually transmitted diseases are not treated at the clinic.    Dental Care: Organization         Address  Phone  Notes  [facility] of [facility] [address] [place], [place] [phone] Accepts children up to age 64 who are enrolled in [place] or [place] Health Choice; pregnant women with a Medicaid card; and children who have  applied for Medicaid or [place] Health Choice, but were declined, whose parents can pay a reduced fee at time of service.  [facility] Department of [facility]  [address], [place] [phone] Accepts children up to age 69 who are enrolled in [place] or [place] Health Choice; pregnant women with a Medicaid card; and children who have applied for Medicaid or [place] Health Choice, but were declined, whose parents can pay a reduced fee at time of service.  [facility]  [address] [place], [place] [phone] Patients are seen by appointment only. Walk-ins are not accepted. [facility] will see patients 43 years of age and older. Monday - Tuesday (8am-5pm) Most Wednesdays (8:30-5pm) $30 per visit, cash only  [facility] Adult [facility] Access [facility]  [address], [facility] [phone] Patients are seen by appointment only. Walk-ins are not accepted. [facility] will see patients 38 years of age and older. One Wednesday Evening (Monthly: Volunteer Based).  $30 per visit, cash only  [employer]  [phone] for adults; Children under age 59, call Graduate Pediatric Dentistry at [phone]. Children aged 55-14, please call [phone] to request a pediatric application.  Dental services are provided in all areas of dental care including fillings, crowns and bridges, complete and partial dentures, implants, gum treatment, root canals, and extractions. Preventive care is also provided. Treatment is provided to both adults and children. Patients are selected via a lottery and there is often a waiting list.   [facility] [address] [place]  [phone] [email or web address]   [facility] [address] [place], [place] [phone], Ext. [phone] Second and Fourth Thursday of each month, opens at 6:30 AM; Clinic ends at 9 AM.  Patients are seen on a first-come first-served basis, and a  limited number are seen during each clinic.   [facility]  [address] Ether Griffins [place], [place] [phone]   Eligibility Requirements You must have lived in [place], [place], or [place] counties for at least the last three months.   You cannot be eligible for state or federal sponsored [place], including [employer], [place], or [employer].   You generally cannot be eligible for healthcare insurance through your employer.    How to apply: Eligibility screenings are held every Tuesday and Wednesday afternoon from 1:00 pm until 4:00 pm. You do not need an appointment for the interview!  [facility] [address]  Sherian Maroon [place], [place] [phone]   [facility] Health Department  [phone]   [facility]  [phone]   [facility] Department  [phone]    Behavioral Health Resources in the Community: Intensive Outpatient Programs Organization         Address  Phone  Notes  [facility] Services [address], [place], [place] [phone]   [facility] Outpatient [address] [place], [place] [phone]   [facility] [address] [place], [place]  [phone]   [facility] [address]  [place], [place] [phone] or [phone]   Substance Abuse Resources Organization         Address  Phone  Notes  [facility] Drug Services  [phone]   [facility]  [phone]   [facility] [place]  [phone]   Floydene Flock  [phone]   Residential & Outpatient Substance Abuse Program  [phone]   Psychological Services Organization         Address  Phone  Notes  [facility] Behavioral Health  [phone]   [facility]  [phone]   [facility] Mental Health [address] [place] [phone] or [phone]    Mobile Crisis Teams Organization          Address  Phone  Notes  [facility], Mobile Crisis Care Unit  [phone]   [facility]  [address] [place], [place] [phone]   Doristine Locks [address] [place] [phone]    Self-Help/Support Groups Organization         Address  Phone             Notes  [facility] [place] - variety of support groups  [phone]- [phone] Call for more information  Narcotics Anonymous (NA), [facility] [address], [employer] [place]  2 meetings at this location   Statistician         Address  Phone  Notes  [facility] [address],    [place]  [phone]   [facility]  [address], [place] [address], [place], [place] [phone]   [facility] [address] [place], [place] [phone] Admissions: 8am-3pm M-F  [facility] [address]    [place], [place] [phone]   [facility] [address] [place], [place], [place] [phone]   [facility] [address],  [place], [place] [phone]   [facility] - Intensive Outpatient [address] Laurell Josephs [address], [place], [place] [phone]   [facility] (Addiction Recovery [facility] [address]  [place], [place] [phone] or [phone]   [facility] ([facility]) [address] [place], [place] [phone] Accepts Medicaid  [facility] [address]  [place] [phone] Substance Abuse/Addiction Treatment   [facility] Organization         Address  Phone  Notes  [facility]  [phone]   Angie Fava, PhD [address] Ervin Knack [place], [place]   [phone] or [phone]   [facility] Behavioral   [address] [employer], [place] [phone]   [facility] [address], [place], [place] [phone] Insurance/Medicaid/sponsorship  through [employer] and Families [address]  [place], [place] [phone] [place], [place] [phone]    Dr. Adele Schilder  [phone]   [facility] Dept. 1) [address] [place] 2) [place] 3)  [address], [place] [phone]  [phone]   [place] [phone] or [phone] (After Hours)

## 2015-08-25 NOTE — Care Management Note (Signed)
Case Management Note  Patient Details  Name: Levi Palmer MRN: [id number] Date of Birth: 04-16-1986  Subjective/Objective:  30 y.o. M seen in the ED for dental pain. CM consulted by Magda Paganini, RN as pt requested to see CM when he was told there are no Emergent Dental services provided here at [facility]. reported he had no financial resources and has been dealing with dental pain for the past few years. Would not look at this CM or engage in meaningful conversation about plan to find Dental Care today.                   Action/Plan: Given Dental Resources for [facility] to include [facility] Departments, and Dr Lawrence Marseilles for EMERGENCY DENTAL CARE @ [phone]. Pt left before conversation complete.    Expected Discharge Date:                  Expected Discharge Plan:  Home/Self Care  In-House Referral:     Discharge planning Services  CM Consult (Referral to Emergency Dental Services Resources)  Post Acute Care Choice:    Choice offered to:  Patient  DME Arranged:    DME Agency:     HH Arranged:    HH Agency:     Status of Service:  Completed, signed off  Medicare Important Message Given:    Date Medicare IM Given:    Medicare IM give by:    Date Additional Medicare IM Given:    Additional Medicare Important Message give by:     If discussed at Long Length of Stay Meetings, dates discussed:    Additional Comments:  Yvone Neu, RN 08/25/2015, 9:26 AM

## 2015-08-25 NOTE — ED Provider Notes (Signed)
CSN: [id number]     Arrival date & time 08/25/15  6578 History   None    Chief Complaint  Patient presents with  . Dental Problem     (Consider location/radiation/quality/duration/timing/severity/associated sxs/prior Treatment) HPI Levi Palmer is a 30 y.o. male who comes in for evaluation of dental pain. Patient reports he has had dental issues ongoing chronically. Current right lower dental pain has been ongoing for 2 weeks. He has not followed up with dentistry. He is currently finding a Education officer, community through [facility]. Discomfort not relieved with Tylenol 3. No fevers, chills, sore throat, difficulties opening jaw. Pain is reported 10/10.  Past Medical History  Diagnosis Date  . Medical history non-contributory   . Anxiety   . Panic attack as reaction to stress   . Anemia   . Depression   . Gallstones   . Dysrhythmia     irregular heart rate - due to zoloft  . Bipolar disorder (HCC)   . Cirrhosis (HCC)   . Constipation    Past Surgical History  Procedure Laterality Date  . Cholecystectomy    . Tonsillectomy    . Incise and drain abcess    . Mouth surgery    . Esophagogastroduodenoscopy N/A 05/22/2015    Procedure: ESOPHAGOGASTRODUODENOSCOPY (EGD);  Surgeon: Vida Rigger, MD;  [facility]COPY;  Service: Endoscopy;  Laterality: N/A;   Family History  Problem Relation Age of Onset  . Irritable bowel syndrome Mother   . Kidney disease Mother   . Ulcerative colitis Maternal Grandfather   . Colon cancer Neg Hx   . Colon polyps Neg Hx   . Gallbladder disease Neg Hx   . Esophageal cancer Neg Hx   . Diabetes Neg Hx    Social History  Substance Use Topics  . Smoking status: Never Smoker   . Smokeless tobacco: Never Used  . Alcohol Use: No    Review of Systems A 10 point review of systems was completed and was negative except for pertinent positives and negatives as mentioned in the history of present illness     Allergies   Reglan; Toradol; Tramadol; and Zoloft  Home Medications   Prior to Admission medications   Medication Sig Start Date End Date Taking? Authorizing Provider  acetaminophen-codeine (TYLENOL #3) 300-30 MG tablet Take 1 tablet by mouth every 6 (six) hours as needed for moderate pain. 08/20/15   Josalyn Funches, MD  chlorhexidine gluconate (PERIDEX) 0.12 % solution Use as directed 15 mLs in the mouth or throat 2 (two) times daily. 08/25/15   Joycie Peek, PA-C  clonazePAM (KLONOPIN) 1 MG tablet Take 1 tablet (1 mg total) by mouth 2 (two) times daily. 08/06/15   Josalyn Funches, MD  diclofenac sodium (VOLTAREN) 1 % GEL Apply 2 g topically daily as needed (joint pain). Reported on 08/06/2015    Historical Provider, MD  diphenhydrAMINE (BENADRYL) 25 mg capsule Take 50 mg by mouth at bedtime as needed for sleep.    Historical Provider, MD  ferrous sulfate 325 (65 FE) MG tablet Take 1 tablet (325 mg total) by mouth 2 (two) times daily with a meal. 07/12/15   Josalyn Funches, MD  hyoscyamine (LEVSIN SL) 0.125 MG SL tablet Place 0.125 mg under the tongue every 4 (four) hours as needed for cramping. Reported on 08/06/2015    Historical Provider, MD  ibuprofen (ADVIL,MOTRIN) 200 MG tablet Take 400-800 mg by mouth every 6 (six) hours as needed for moderate pain.  Historical Provider, MD  Linaclotide Karlene Einstein) 145 MCG CAPS capsule Take 2 capsules (290 mcg total) by mouth daily. Dr. Ewing Schlein Patient not taking: Reported on 08/06/2015 05/29/15   Dessa Phi, MD  penicillin v potassium (VEETID) 500 MG tablet Take 1 tablet (500 mg total) by mouth 4 (four) times daily. 08/25/15 09/01/15  Joycie Peek, PA-C  promethazine (PHENERGAN) 25 MG tablet Take 1 tablet (25 mg total) by mouth every 8 (eight) hours as needed for nausea or vomiting. 07/19/15   Josalyn Funches, MD   BP 120/72 mmHg  Pulse 88  Temp(Src) 97.7 F (36.5 C) (Oral)  Resp 18  Ht  (1.702 m)  Wt 61.236 kg  BMI 21.14 kg/m2  SpO2 100% Physical  Exam  Constitutional:  Awake, alert, nontoxic appearance.  HENT:  Head: Atraumatic.  Discomfort located to right mandibular first molar. Overall poor dentition. Multiple missing teeth with active caries. Mucous membranes are moist. No unilateral tonsillar swelling, uvula midline, no glossal swelling or elevation. No trismus. No fluctuance or evidence of a drainable abscess. No other evidence of emergent infection, Retropharyngeal or Peritonsillar abscess, Ludwig or Vincents angina. Tolerating secretions well. Patent airway   Eyes: Right eye exhibits no discharge. Left eye exhibits no discharge.  Neck: Neck supple.  Pulmonary/Chest: Effort normal. He exhibits no tenderness.  Abdominal: Soft. There is no tenderness. There is no rebound.  Musculoskeletal: He exhibits no tenderness.  Baseline ROM, no obvious new focal weakness.  Neurological:  Mental status and motor strength appears baseline for patient and situation.  Skin: No rash noted.  Psychiatric: He has a normal mood and affect.  Nursing note and vitals reviewed.   ED Course  Procedures (including critical care time) Labs Review Labs Reviewed - No data to display  Imaging Review No results found. I have personally reviewed and evaluated these images and lab results as part of my medical decision-making.   EKG Interpretation None     Meds given in ED:  Medications  bupivacaine-epinephrine (MARCAINE W/ EPI) 0.5% -1:200000 injection 1.8 mL (1.8 mLs Infiltration Given 08/25/15 0902)    New Prescriptions   PENICILLIN V POTASSIUM (VEETID) 500 MG TABLET    Take 1 tablet (500 mg total) by mouth 4 (four) times daily.   Filed Vitals:   08/25/15 0824  BP: 120/72  Pulse: 88  Temp: 97.7 F (36.5 C)  TempSrc: Oral  Resp: 18  Height:  (1.702 m)  Weight: 61.236 kg  SpO2: 100%   NERVE BLOCK Performed by: Sharlene Motts Consent: Verbal consent obtained. Required items: required blood products, implants, devices,  and special equipment available Time out: Immediately prior to procedure a "time out" was called to verify the correct patient, procedure, equipment, support staff and site/side marked as required.  Indication: Dental pain  Nerve block body site: Right inferior alveolar   Preparation: Patient was prepped and draped in the usual sterile fashion. Needle gauge: 24 G Location technique: anatomical landmarks  Local anesthetic: Bupivacaine   Anesthetic total: 1.8 ml  Outcome: pain improved Patient tolerance: Patient tolerated the procedure well with no immediate complications.  MDM  Here for evaluation of dental pain. On exam, there is no evidence of a drainable abscess. No trismus, glossal elevation, unilateral tonsillar swelling. No evidence of retropharyngeal or peritonsillar abscess or Ludwig angina. Patient received a dental block in the ED and experiences relief. Discharged with outpatient dental resources. Also given prescription for antibiotics. Encouraged continued use of Tylenol Motrin at home for discomfort.  Overall, appears well, nontoxic and appropriate for discharge.  Final diagnoses:  Pain, dental        Joycie Peek, PA-C 08/25/15 1610  Alvira Monday, MD 08/27/15 2243

## 2015-08-25 NOTE — ED Notes (Signed)
Declined W/C at D/C and was escorted to lobby by RN. 

## 2015-09-03 ENCOUNTER — Encounter: Payer: Self-pay | Admitting: Family Medicine

## 2015-09-03 ENCOUNTER — Ambulatory Visit: Payer: No Typology Code available for payment source | Attending: Family Medicine | Admitting: Family Medicine

## 2015-09-03 ENCOUNTER — Encounter: Payer: Self-pay | Admitting: Clinical

## 2015-09-03 VITALS — BP 106/66 | HR 101 | Temp 97.9°F | Resp 16 | Ht 67.0 in | Wt 131.0 lb

## 2015-09-03 DIAGNOSIS — Z79899 Other long term (current) drug therapy: Secondary | ICD-10-CM | POA: Insufficient documentation

## 2015-09-03 DIAGNOSIS — R51 Headache: Secondary | ICD-10-CM | POA: Insufficient documentation

## 2015-09-03 DIAGNOSIS — F419 Anxiety disorder, unspecified: Secondary | ICD-10-CM

## 2015-09-03 DIAGNOSIS — G8929 Other chronic pain: Secondary | ICD-10-CM | POA: Insufficient documentation

## 2015-09-03 DIAGNOSIS — F32A Depression, unspecified: Secondary | ICD-10-CM

## 2015-09-03 DIAGNOSIS — F329 Major depressive disorder, single episode, unspecified: Secondary | ICD-10-CM

## 2015-09-03 DIAGNOSIS — R109 Unspecified abdominal pain: Secondary | ICD-10-CM | POA: Insufficient documentation

## 2015-09-03 DIAGNOSIS — Z23 Encounter for immunization: Secondary | ICD-10-CM

## 2015-09-03 DIAGNOSIS — S025XXA Fracture of tooth (traumatic), initial encounter for closed fracture: Secondary | ICD-10-CM | POA: Insufficient documentation

## 2015-09-03 MED ORDER — CLONAZEPAM 1 MG PO TABS: 1.0000 mg | ORAL_TABLET | Freq: Two times a day (BID) | ORAL | Status: DC

## 2015-09-03 NOTE — Progress Notes (Signed)
Depression screen [facility] 2/9 09/03/2015 09/03/2015 08/06/2015 08/06/2015 06/14/2015  Decreased Interest 0  Down, Depressed, Hopeless 0  PHQ - 2 Score 0  Altered sleeping -  Tired, decreased energy -  Change in appetite -  Feeling bad or failure about yourself  -  Trouble concentrating -  Moving slowly or fidgety/restless -  Suicidal thoughts 0 0 3 1 -  PHQ-9 Score -    GAD 7 : Generalized Anxiety Score 09/03/2015 09/03/2015 08/06/2015 08/06/2015  Nervous, Anxious, on Edge Control/stop worrying Worry too much - different things Trouble relaxing Restless 0 0 3 3  Easily annoyed or irritable Afraid - awful might happen Total GAD 7 Score Anxiety Difficulty - - - Not difficult at all

## 2015-09-03 NOTE — Progress Notes (Signed)
F/U Depression, abdominal pain Pain scale #8 No tobacco user  No suicidal thoughts

## 2015-09-03 NOTE — Assessment & Plan Note (Addendum)
A: chronic depression, suicidal ideation has resolved  P: Mental health evaluation

## 2015-09-03 NOTE — Patient Instructions (Addendum)
Levi Palmer was seen today for follow-up, depression, abdominal pain and anxiety.  Diagnoses and all orders for this visit:  Anxiety -     clonazePAM (KLONOPIN) 1 MG tablet; Take 1 tablet (1 mg total) by mouth 2 (two) times daily.  Other orders -     Tdap vaccine greater than or equal to 29yo IM    Continue with plan for mental health evaluation to determine if you would best benefit from an antidepressant or a mood stabilizer.   You can also get therapy and counseling services.   I recommend [facility] [place], [facility] is also a good option.   F/u in 8 week   Call for refill of tylenol # and klonopin   Dr. Armen Pickup

## 2015-09-03 NOTE — Assessment & Plan Note (Signed)
A: chronic anxiety, slight improved with increased klonopin dose P: Refilled klonopin Mental health evaluation
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
## 2015-09-10 ENCOUNTER — Telehealth: Payer: Self-pay | Admitting: Family Medicine

## 2015-09-10 NOTE — Telephone Encounter (Signed)
Patient called requesting to speak nurse regarding medication question. Please f/u

## 2015-09-11 NOTE — Telephone Encounter (Signed)
Patient is requesting a medication refill tylenol #3...Marland Kitchen.please follow up

## 2015-09-12 NOTE — Telephone Encounter (Signed)
Pt notified to early for refills  Last Rx give on 08/20/2015

## 2015-09-14 ENCOUNTER — Encounter (HOSPITAL_COMMUNITY): Payer: Self-pay | Admitting: Emergency Medicine

## 2015-09-14 ENCOUNTER — Emergency Department (HOSPITAL_COMMUNITY)
Admission: EM | Admit: 2015-09-14 | Discharge: 2015-09-14 | Disposition: A | Discharge status: Home / Self Care | Payer: No Typology Code available for payment source | Attending: Emergency Medicine | Admitting: Emergency Medicine

## 2015-09-14 ENCOUNTER — Telehealth: Payer: Self-pay | Admitting: Family Medicine

## 2015-09-14 DIAGNOSIS — F410 Panic disorder [episodic paroxysmal anxiety] without agoraphobia: Secondary | ICD-10-CM | POA: Insufficient documentation

## 2015-09-14 DIAGNOSIS — I889 Nonspecific lymphadenitis, unspecified: Secondary | ICD-10-CM | POA: Insufficient documentation

## 2015-09-14 DIAGNOSIS — D649 Anemia, unspecified: Secondary | ICD-10-CM | POA: Insufficient documentation

## 2015-09-14 DIAGNOSIS — K5900 Constipation, unspecified: Secondary | ICD-10-CM | POA: Insufficient documentation

## 2015-09-14 DIAGNOSIS — L739 Follicular disorder, unspecified: Secondary | ICD-10-CM | POA: Insufficient documentation

## 2015-09-14 DIAGNOSIS — F319 Bipolar disorder, unspecified: Secondary | ICD-10-CM | POA: Insufficient documentation

## 2015-09-14 DIAGNOSIS — Z79899 Other long term (current) drug therapy: Secondary | ICD-10-CM | POA: Insufficient documentation

## 2015-09-14 MED ORDER — DOXYCYCLINE HYCLATE 100 MG PO CAPS: 100.0000 mg | ORAL_CAPSULE | Freq: Two times a day (BID) | ORAL | Status: DC

## 2015-09-14 MED ORDER — DOXYCYCLINE HYCLATE 100 MG PO TABS
100.0000 mg | ORAL_TABLET | Freq: Once | ORAL | Status: AC
  Administered 2015-09-14: 100 mg via ORAL
  Filled 2015-09-14: qty 1

## 2015-09-14 MED ORDER — HYDROCODONE-ACETAMINOPHEN 5-325 MG PO TABS: 1.0000 | ORAL_TABLET | Freq: Four times a day (QID) | ORAL | Status: DC | PRN

## 2015-09-14 MED ORDER — HYDROCODONE-ACETAMINOPHEN 5-325 MG PO TABS
1.0000 | ORAL_TABLET | Freq: Once | ORAL | Status: AC
  Administered 2015-09-14: 1 via ORAL
  Filled 2015-09-14: qty 1

## 2015-09-14 MED FILL — ?DOXYCYCLINE 100 MG TABLET: 100 | 7 days supply | Qty: 14 | Fill #0

## 2015-09-14 NOTE — Telephone Encounter (Signed)
Medication Refill: acetaminophen-codeine (TYLENOL #3) 300-30 MG tablet and promethazine (PHENERGAN) 25 MG tablet  Pt understands that he can not refill until the 12th but he is requesting early so he can receive medication at the beginning of the week Pt also would like to speak with nurse today if possible  Please contact, thank you

## 2015-09-14 NOTE — ED Notes (Signed)
Patient in gown, on continuous pulse oximetry and blood pressure cuff; warm blanket given

## 2015-09-14 NOTE — ED Provider Notes (Signed)
CSN: [id number]     Arrival date & time 09/14/15  0744 History   First MD Initiated Contact with Patient 09/14/15 [phone]     Chief Complaint  Patient presents with  . Leg Pain  . Groin Pain     (Consider location/radiation/quality/duration/timing/severity/associated sxs/prior Treatment) Patient is a 30 y.o. male presenting with leg pain and groin pain. The history is provided by the patient.  Leg Pain Associated symptoms: no back pain and no fever   Groin Pain Pertinent negatives include no chest pain, no abdominal pain, no headaches and no shortness of breath.  Patient c/o right groin pain/swelling in past couple days. Symptoms acute onset, constant, persistent, moderate. Worse w palpation. Denies injury or strain to area. Denies same symptoms in past. States pain radiates to right anterior thigh. No posterior/sciatica/radicular pain. No leg numbness or weakness. No swelling to leg.  Pt indicates otherwise health at baseline, feels well. No cp or sob. No fever or chills.       Past Medical History  Diagnosis Date  . Medical history non-contributory   . Anxiety   . Panic attack as reaction to stress   . Anemia   . Depression   . Gallstones   . Dysrhythmia     irregular heart rate - due to zoloft  . Bipolar disorder (HCC)   . Cirrhosis (HCC)   . Constipation    Past Surgical History  Procedure Laterality Date  . Cholecystectomy    . Tonsillectomy    . Incise and drain abcess    . Mouth surgery    . Esophagogastroduodenoscopy N/A 05/22/2015    Procedure: ESOPHAGOGASTRODUODENOSCOPY (EGD);  Surgeon: Vida RiggerMarc Magod, MD;  [facility];  Service: Endoscopy;  Laterality: N/A;   Family History  Problem Relation Age of Onset  . Irritable bowel syndrome Mother   . Kidney disease Mother   . Ulcerative colitis Maternal Grandfather   . Colon cancer Neg Hx   . Colon polyps Neg Hx   . Gallbladder disease Neg Hx   . Esophageal cancer Neg Hx   . Diabetes Neg Hx    Social  History  Substance Use Topics  . Smoking status: Never Smoker   . Smokeless tobacco: Never Used  . Alcohol Use: No    Review of Systems  Constitutional: Negative for fever and chills.  HENT: Negative for sore throat.   Respiratory: Negative for shortness of breath.   Cardiovascular: Negative for chest pain and leg swelling.  Gastrointestinal: Negative for nausea, vomiting and abdominal pain.  Musculoskeletal: Negative for back pain.  Skin: Negative for rash.  Neurological: Negative for weakness, numbness and headaches.      Allergies  Reglan; Toradol; Tramadol; and Zoloft  Home Medications   Prior to Admission medications   Medication Sig Start Date End Date Taking? Authorizing Provider  acetaminophen-codeine (TYLENOL #3) 300-30 MG tablet Take 1 tablet by mouth every 6 (six) hours as needed for moderate pain. 08/20/15   Josalyn Funches, MD  chlorhexidine gluconate (PERIDEX) 0.12 % solution Use as directed 15 mLs in the mouth or throat 2 (two) times daily. 08/25/15   Joycie PeekBenjamin Cartner, PA-C  clonazePAM (KLONOPIN) 1 MG tablet Take 1 tablet (1 mg total) by mouth 2 (two) times daily. 09/03/15   Josalyn Funches, MD  diclofenac sodium (VOLTAREN) 1 % GEL Apply 2 g topically daily as needed (joint pain). Reported on 08/06/2015    Historical Provider, MD  diphenhydrAMINE (BENADRYL) 25 mg capsule Take 50 mg by mouth  at bedtime as needed for sleep.    Historical Provider, MD  ferrous sulfate 325 (65 FE) MG tablet Take 1 tablet (325 mg total) by mouth 2 (two) times daily with a meal. 07/12/15   Josalyn Funches, MD  hyoscyamine (LEVSIN SL) 0.125 MG SL tablet Place 0.125 mg under the tongue every 4 (four) hours as needed for cramping. Reported on 09/03/2015    Historical Provider, MD  ibuprofen (ADVIL,MOTRIN) 200 MG tablet Take 400-800 mg by mouth every 6 (six) hours as needed for moderate pain.     Historical Provider, MD  Linaclotide Karlene Einstein) 145 MCG CAPS capsule Take 2 capsules (290 mcg total) by  mouth daily. Dr. Ewing Schlein 05/29/15   Dessa Phi, MD  promethazine (PHENERGAN) 25 MG tablet Take 1 tablet (25 mg total) by mouth every 8 (eight) hours as needed for nausea or vomiting. 07/19/15   Josalyn Funches, MD   BP 105/72 mmHg  Pulse 75  Temp(Src) 97.9 F (36.6 C) (Oral)  Resp 15  Ht  (1.702 m)  Wt 59.875 kg  BMI 20.67 kg/m2  SpO2 97% Physical Exam  Constitutional: He is oriented to person, place, and time. He appears well-developed and well-nourished. No distress.  HENT:  Head: Atraumatic.  Eyes: Conjunctivae are normal. No scleral icterus.  Neck: Normal range of motion. Neck supple.  Cardiovascular: Intact distal pulses.   Pulmonary/Chest: Effort normal.  Abdominal: Soft. Bowel sounds are normal. He exhibits no distension and no mass. There is no tenderness. There is no rebound and no guarding.  No inguinal hernia.   Genitourinary:  Normal ext genitalia, testes des bil, non tender.   Musculoskeletal: Normal range of motion. He exhibits no edema or tenderness.  Normal rom at right hip and knee without pain. Dp/pt 2+. No sts or edema to leg.   Neurological: He is alert and oriented to person, place, and time.  Motor intact RLE, stre 5/5. sens intact. Steady gait.  Skin: Skin is warm and dry. He is not diaphoretic. No erythema.  Patient w folliculitis right suprapubic area, w mild surrounding erythema. No abscess. No skin ulceration. Right fem/ing lymphadenopathy, mildly tender.   Psychiatric: He has a normal mood and affect.    ED Course  Procedures (including critical care time)       MDM   Reviewed nursing notes and prior charts for additional history.   Exam c/w folliculitis, and area in groin c/w inflamed lymph node. No drainable abscess noted.   Confirmed no abx allergies.   Will give rx doxy, rec warm compresses.  Pt requests pain meds. Allergy to nsaid/toradol, tramadol.   rec acetaminophen. Pt indicates otc med havent helped. Will give small  quantity hydrocodone for acute pain.   Patient indicates has ride/bus, does not have to drive.  Doxycycline po.  Hydrocodone po.   Pt appears stable for d/c.  Indicates has pcp f/u at [facility].      Cathren Laine, MD 09/14/15 [phone]

## 2015-09-14 NOTE — ED Notes (Signed)
Brought patient back to room via wheelchair; patient getting undressed and into a gown at this time 

## 2015-09-14 NOTE — ED Notes (Addendum)
Pt c/o lump in right groin area noticed yesterday. Pt reports right leg pain x 1 week. Pt denies heavy lifting. Reports having the urge to urinate but a little came out.

## 2015-09-14 NOTE — Discharge Instructions (Signed)
It was our pleasure to provide your ER care today - we hope that you feel better.  Keep skin/area very clean - wash with warm soap/water 2x/day.   Try warm compresses/heat to sore area for symptom relief.   Take doxycyline (antibiotic) as prescribed.  For mild-moderate pain you may take tylenol/acetaminophen as need.    If pain not controlled with acetaminophen, you may take hydrocodone as need for pain. No driving when taking hydrocodone. Make sure to not take tylenol or acetaminophen containing medication when taking the hydrocodone.  Follow up with primary care doctor in 1 week if symptoms fail to improve/resolve.  Return to ER if worse, new symptoms, spreading redness, severe pain, high fevers, other concern.       Folliculitis Folliculitis is redness, soreness, and swelling (inflammation) of the hair follicles. This condition can occur anywhere on the body. People with weakened immune systems, diabetes, or obesity have a greater risk of getting folliculitis. CAUSES  Bacterial infection. This is the most common cause.  Fungal infection.  Viral infection.  Contact with certain chemicals, especially oils and tars. Long-term folliculitis can result from bacteria that live in the nostrils. The bacteria may trigger multiple outbreaks of folliculitis over time. SYMPTOMS Folliculitis most commonly occurs on the scalp, thighs, legs, back, buttocks, and areas where hair is shaved frequently. An early sign of folliculitis is a small, white or yellow, pus-filled, itchy lesion (pustule). These lesions appear on a red, inflamed follicle. They are usually less than 0.2 inches (5 mm) wide. When there is an infection of the follicle that goes deeper, it becomes a boil or furuncle. A group of closely packed boils creates a larger lesion (carbuncle). Carbuncles tend to occur in hairy, sweaty areas of the body. DIAGNOSIS  Your caregiver can usually tell what is wrong by doing a physical exam. A  sample may be taken from one of the lesions and tested in a lab. This can help determine what is causing your folliculitis. TREATMENT  Treatment may include:  Applying warm compresses to the affected areas.  Taking antibiotic medicines orally or applying them to the skin.  Draining the lesions if they contain a large amount of pus or fluid.  Laser hair removal for cases of long-lasting folliculitis. This helps to prevent regrowth of the hair. HOME CARE INSTRUCTIONS  Apply warm compresses to the affected areas as directed by your caregiver.  If antibiotics are prescribed, take them as directed. Finish them even if you start to feel better.  You may take over-the-counter medicines to relieve itching.  Do not shave irritated skin.  Follow up with your caregiver as directed. SEEK IMMEDIATE MEDICAL CARE IF:   You have increasing redness, swelling, or pain in the affected area.  You have a fever. MAKE SURE YOU:  Understand these instructions.  Will watch your condition.  Will get help right away if you are not doing well or get worse.   This information is not intended to replace advice given to you by your health care provider. Make sure you discuss any questions you have with your health care provider.   Document Released: 09/01/2001 Document Revised: 07/14/2014 Document Reviewed: 09/23/2011 Elsevier Interactive Patient Education 2016 Elsevier Inc.    Lymphadenopathy Lymphadenopathy refers to swollen or enlarged lymph glands, also called lymph nodes. Lymph glands are part of your body's defense (immune) system, which protects the body from infections, germs, and diseases. Lymph glands are found in many locations in your body, including the neck,  underarm, and groin.  Many things can cause lymph glands to become enlarged. When your immune system responds to germs, such as viruses or bacteria, infection-fighting cells and fluid build up. This causes the glands to grow in size.  Usually, this is not something to worry about. The swelling and any soreness often go away without treatment. However, swollen lymph glands can also be caused by a number of diseases. Your health care provider may do various tests to help determine the cause. If the cause of your swollen lymph glands cannot be found, it is important to monitor your condition to make sure the swelling goes away. HOME CARE INSTRUCTIONS Watch your condition for any changes. The following actions may help to lessen any discomfort you are feeling:  Get plenty of rest.  Take medicines only as directed by your health care provider. Your health care provider may recommend over-the-counter medicines for pain.  Apply moist heat compresses to the site of swollen lymph nodes as directed by your health care provider. This can help reduce any pain.  Check your lymph nodes daily for any changes.  Keep all follow-up visits as directed by your health care provider. This is important. SEEK MEDICAL CARE IF:  Your lymph nodes are still swollen after 2 weeks.  Your swelling increases or spreads to other areas.  Your lymph nodes are hard, seem fixed to the skin, or are growing rapidly.  Your skin over the lymph nodes is red and inflamed.  You have a fever.  You have chills.  You have fatigue.  You develop a sore throat.  You have abdominal pain.  You have weight loss.  You have night sweats. SEEK IMMEDIATE MEDICAL CARE IF:  You notice fluid leaking from the area of the enlarged lymph node.  You have severe pain in any area of your body.  You have chest pain.  You have shortness of breath.   This information is not intended to replace advice given to you by your health care provider. Make sure you discuss any questions you have with your health care provider.   Document Released: 04/01/2008 Document Revised: 07/14/2014 Document Reviewed: 01/26/2014 Elsevier Interactive Patient Education [employer]2016 Elsevier  Inc.

## 2015-09-17 ENCOUNTER — Emergency Department (HOSPITAL_COMMUNITY)
Admission: EM | Admit: 2015-09-17 | Discharge: 2015-09-17 | Disposition: A | Discharge status: Home / Self Care | Payer: Self-pay | Attending: Emergency Medicine | Admitting: Emergency Medicine

## 2015-09-17 ENCOUNTER — Encounter (HOSPITAL_COMMUNITY): Payer: Self-pay | Admitting: Emergency Medicine

## 2015-09-17 ENCOUNTER — Telehealth: Payer: Self-pay | Admitting: *Deleted

## 2015-09-17 ENCOUNTER — Telehealth: Payer: Self-pay | Admitting: Family Medicine

## 2015-09-17 ENCOUNTER — Emergency Department (HOSPITAL_COMMUNITY): Payer: Self-pay

## 2015-09-17 DIAGNOSIS — F410 Panic disorder [episodic paroxysmal anxiety] without agoraphobia: Secondary | ICD-10-CM | POA: Insufficient documentation

## 2015-09-17 DIAGNOSIS — Z9049 Acquired absence of other specified parts of digestive tract: Secondary | ICD-10-CM | POA: Insufficient documentation

## 2015-09-17 DIAGNOSIS — F319 Bipolar disorder, unspecified: Secondary | ICD-10-CM | POA: Insufficient documentation

## 2015-09-17 DIAGNOSIS — Z79899 Other long term (current) drug therapy: Secondary | ICD-10-CM | POA: Insufficient documentation

## 2015-09-17 DIAGNOSIS — K5900 Constipation, unspecified: Secondary | ICD-10-CM | POA: Insufficient documentation

## 2015-09-17 DIAGNOSIS — D649 Anemia, unspecified: Secondary | ICD-10-CM | POA: Insufficient documentation

## 2015-09-17 DIAGNOSIS — R109 Unspecified abdominal pain: Principal | ICD-10-CM

## 2015-09-17 DIAGNOSIS — M5416 Radiculopathy, lumbar region: Secondary | ICD-10-CM | POA: Insufficient documentation

## 2015-09-17 DIAGNOSIS — G8929 Other chronic pain: Secondary | ICD-10-CM

## 2015-09-17 LAB — CBC
HCT: 38.1 % — ABNORMAL LOW (ref 39.0–52.0)
HEMOGLOBIN: 12.1 g/dL — AB (ref 13.0–17.0)
MCH: 24.2 pg — AB (ref 26.0–34.0)
MCHC: 31.8 g/dL (ref 30.0–36.0)
MCV: 76.2 fL — ABNORMAL LOW (ref 78.0–100.0)
PLATELETS: 360 10*3/uL (ref 150–400)
RBC: 5 MIL/uL (ref 4.22–5.81)
RDW: 16.7 % — AB (ref 11.5–15.5)
WBC: 10.2 10*3/uL (ref 4.0–10.5)

## 2015-09-17 LAB — COMPREHENSIVE METABOLIC PANEL
ALT: 11 U/L — ABNORMAL LOW (ref 17–63)
ANION GAP: 10 (ref 5–15)
AST: 17 U/L (ref 15–41)
Albumin: 4 g/dL (ref 3.5–5.0)
Alkaline Phosphatase: 71 U/L (ref 38–126)
BILIRUBIN TOTAL: 0.7 mg/dL (ref 0.3–1.2)
BUN: 11 mg/dL (ref 6–20)
CHLORIDE: 104 mmol/L (ref 101–111)
CO2: 24 mmol/L (ref 22–32)
Calcium: 9.4 mg/dL (ref 8.9–10.3)
Creatinine, Ser: 0.93 mg/dL (ref 0.61–1.24)
Glucose, Bld: 105 mg/dL — ABNORMAL HIGH (ref 65–99)
POTASSIUM: 3.9 mmol/L (ref 3.5–5.1)
Sodium: 138 mmol/L (ref 135–145)
TOTAL PROTEIN: 7.5 g/dL (ref 6.5–8.1)

## 2015-09-17 MED ORDER — MORPHINE SULFATE (PF) 4 MG/ML IV SOLN
6.0000 mg | Freq: Once | INTRAVENOUS | Status: AC
  Administered 2015-09-17: 6 mg via INTRAVENOUS
  Filled 2015-09-17: qty 2

## 2015-09-17 MED ORDER — ACETAMINOPHEN-CODEINE #3 300-30 MG PO TABS: 1.0000 | ORAL_TABLET | Freq: Four times a day (QID) | ORAL | Status: DC | PRN

## 2015-09-17 MED ORDER — PROMETHAZINE HCL 25 MG PO TABS: 25.0000 mg | ORAL_TABLET | Freq: Three times a day (TID) | ORAL | Status: DC | PRN

## 2015-09-17 MED FILL — ACETAMINOPHEN/COD #3 TABLET: 300-30 | 30 days supply | Qty: 120 | Fill #0

## 2015-09-17 MED FILL — PROMETHAZINE 25 MG TABLET: 25 | 20 days supply | Qty: 60 | Fill #0

## 2015-09-17 NOTE — Telephone Encounter (Signed)
Pt stated, he is in pain and tylenol #3 not helping  Stated was given something stronger for pain  Pt advised we Rx tylenol #3 for chronic pain  Pt need to maintain the F/U with PCP Pain management referral place  Pt very argumentative, advised if he has any problem he is welcome to talk with Dr Hyman HopesJegede

## 2015-09-17 NOTE — ED Provider Notes (Signed)
CSN: [id number]     Arrival date & time 09/17/15  [id number] History   First MD Initiated Contact with Patient 09/17/15 [phone]     Chief Complaint  Patient presents with  . Leg Pain  . Abdominal Pain     (Consider location/radiation/quality/duration/timing/severity/associated sxs/prior Treatment) Patient is a 30 y.o. male presenting with leg pain and abdominal pain.  Leg Pain Abdominal Pain  Complains of right lower back pain radiating to lower abdomen and right leg to knee onset 1 week ago, progressively worsening. He was seen here on 09/14/2015 for "right groin pain diagnosed with folliculitis of groin prescribed doxycycline and given which she's taken without relief. He denies fever no loss of bladder or bowel control. No injury. Pain worse with walking. Not improved by anything No other associated symptoms. Past Medical History  Diagnosis Date  . Medical history non-contributory   . Anxiety   . Panic attack as reaction to stress   . Anemia   . Depression   . Gallstones   . Dysrhythmia     irregular heart rate - due to zoloft  . Bipolar disorder (HCC)   . Cirrhosis (HCC)   . Constipation    Past Surgical History  Procedure Laterality Date  . Cholecystectomy    . Tonsillectomy    . Incise and drain abcess    . Mouth surgery    . Esophagogastroduodenoscopy N/A 05/22/2015    Procedure: ESOPHAGOGASTRODUODENOSCOPY (EGD);  Surgeon: Vida RiggerMarc Magod, MD;  Location: [facility] ENDOSCOPY;  Service: Endoscopy;  Laterality: N/A;   Family History  Problem Relation Age of Onset  . Irritable bowel syndrome Mother   . Kidney disease Mother   . Ulcerative colitis Maternal Grandfather   . Colon cancer Neg Hx   . Colon polyps Neg Hx   . Gallbladder disease Neg Hx   . Esophageal cancer Neg Hx   . Diabetes Neg Hx    Social History  Substance Use Topics  . Smoking status: Never Smoker   . Smokeless tobacco: Never Used  . Alcohol Use: No    Review of Systems  Constitutional: Negative.   HENT:  Negative.   Respiratory: Negative.   Cardiovascular: Negative.   Gastrointestinal: Positive for abdominal pain.  Genitourinary: Negative.   Musculoskeletal: Positive for arthralgias.  Skin: Negative.   Neurological: Negative.   Psychiatric/Behavioral: Negative.   All other systems reviewed and are negative.     Allergies  Reglan; Toradol; Tramadol; and Zoloft  Home Medications   Prior to Admission medications   Medication Sig Start Date End Date Taking? Authorizing Provider  acetaminophen-codeine (TYLENOL #3) 300-30 MG tablet Take 1 tablet by mouth every 6 (six) hours as needed for moderate pain. 08/20/15   Josalyn Funches, MD  chlorhexidine gluconate (PERIDEX) 0.12 % solution Use as directed 15 mLs in the mouth or throat 2 (two) times daily. 08/25/15   Joycie PeekBenjamin Cartner, PA-C  clonazePAM (KLONOPIN) 1 MG tablet Take 1 tablet (1 mg total) by mouth 2 (two) times daily. 09/03/15   Josalyn Funches, MD  diclofenac sodium (VOLTAREN) 1 % GEL Apply 2 g topically daily as needed (joint pain). Reported on 08/06/2015    Historical Provider, MD  diphenhydrAMINE (BENADRYL) 25 mg capsule Take 50 mg by mouth at bedtime as needed for sleep.    Historical Provider, MD  doxycycline (VIBRAMYCIN) 100 MG capsule Take 1 capsule (100 mg total) by mouth 2 (two) times daily. 09/14/15   Cathren LaineKevin Steinl, MD  ferrous sulfate 325 (65 FE) MG tablet  Take 1 tablet (325 mg total) by mouth 2 (two) times daily with a meal. 07/12/15   Dessa Phi, MD  HYDROcodone-acetaminophen (NORCO/VICODIN) 5-325 MG tablet Take 1-2 tablets by mouth every 6 (six) hours as needed for moderate pain. 09/14/15   Cathren Laine, MD  hyoscyamine (LEVSIN SL) 0.125 MG SL tablet Place 0.125 mg under the tongue every 4 (four) hours as needed for cramping. Reported on 09/03/2015    Historical Provider, MD  ibuprofen (ADVIL,MOTRIN) 200 MG tablet Take 400-800 mg by mouth every 6 (six) hours as needed for moderate pain.     Historical Provider, MD  Linaclotide  Karlene Einstein) 145 MCG CAPS capsule Take 2 capsules (290 mcg total) by mouth daily. Dr. Ewing Schlein 05/29/15   Dessa Phi, MD  promethazine (PHENERGAN) 25 MG tablet Take 1 tablet (25 mg total) by mouth every 8 (eight) hours as needed for nausea or vomiting. 07/19/15   Dessa Phi, MD   BP 112/73 mmHg  Pulse 97  Temp(Src) 97.8 F (36.6 C) (Oral)  Resp 20  Ht  (1.702 m)  Wt 130 lb (58.968 kg)  BMI 20.36 kg/m2  SpO2 100% Physical Exam  Constitutional: He appears distressed.  Appears uncomfortable  HENT:  Head: Normocephalic and atraumatic.  Eyes: Conjunctivae are normal. Pupils are equal, round, and reactive to light.  Neck: Neck supple. No tracheal deviation present. No thyromegaly present.  Cardiovascular: Normal rate and regular rhythm.   No murmur heard. Pulmonary/Chest: Effort normal and breath sounds normal.  Abdominal: Soft. Bowel sounds are normal. He exhibits no distension. There is no tenderness.  Genitourinary: Penis normal.  Musculoskeletal: Normal range of motion. He exhibits no edema or tenderness.  Entire spine nontender. He has pain at right lower back and at right thigh anteriorly when he attempts to walk all 4 extremities without redness swelling or point tenderness neurovascularly intact  Neurological: He is alert. He has normal reflexes. Coordination normal.  Walks with limp favoring right lower extremity  Skin: Skin is warm and dry. No rash noted.  Psychiatric: He has a normal mood and affect.  Nursing note and vitals reviewed.   ED Course  Procedures (including critical care time) Labs Review Labs Reviewed  COMPREHENSIVE METABOLIC PANEL - Abnormal; Notable for the following:    Glucose, Bld 105 (*)    ALT 11 (*)    All other components within normal limits  CBC - Abnormal; Notable for the following:    Hemoglobin 12.1 (*)    HCT 38.1 (*)    MCV 76.2 (*)    MCH 24.2 (*)    RDW 16.7 (*)    All other components within normal limits    Imaging  Review No results found. I have personally reviewed and evaluated these images and lab results as part of my medical decision-making.   EKG Interpretation None     I spoke with Dr.Funchess, patients PMD at [facility]. He is a chronic pain patient suffers from chronic abdominal pain. He gets regularly scheduled prescribed Tylenol 3. A prescription is waiting for him at the wellness Center today.  11: 2 5 AM pain improved after treatment with intravenous morphine Results for orders placed or performed during the hospital encounter of 09/17/15  Comprehensive metabolic panel  Result Value Ref Range   Sodium 138 135 - 145 mmol/L   Potassium 3.9 3.5 - 5.1 mmol/L   Chloride 104 101 - 111 mmol/L   CO2 24 22 - 32 mmol/L  Glucose, Bld 105 (H) 65 - 99 mg/dL   BUN 11 6 - 20 mg/dL   Creatinine, Ser 4.09 0.61 - 1.24 mg/dL   Calcium 9.4 8.9 - 81.1 mg/dL   Total Protein 7.5 6.5 - 8.1 g/dL   Albumin 4.0 3.5 - 5.0 g/dL   AST 17 15 - 41 U/L   ALT 11 (L) 17 - 63 U/L   Alkaline Phosphatase 71 38 - 126 U/L   Total Bilirubin 0.7 0.3 - 1.2 mg/dL   GFR calc non Af Amer >60 >60 mL/min   GFR calc Af Amer >60 >60 mL/min   Anion gap 10 5 - 15  CBC  Result Value Ref Range   WBC 10.2 4.0 - 10.5 K/uL   RBC 5.00 4.22 - 5.81 MIL/uL   Hemoglobin 12.1 (L) 13.0 - 17.0 g/dL   HCT 91.4 (L) 78.2 - 95.6 %   MCV 76.2 (L) 78.0 - 100.0 fL   MCH 24.2 (L) 26.0 - 34.0 pg   MCHC 31.8 30.0 - 36.0 g/dL   RDW 21.3 (H) 08.6 - 57.8 %   Platelets 360 150 - 400 K/uL   Mr Lumbar Spine Wo Contrast  09/17/2015  CLINICAL DATA:  New onset right lower extremity pain extending to the abdomen. Recent diagnosis of folliculitis. The patient has been on antibiotics. The medications are not helping. EXAM: MRI LUMBAR SPINE WITHOUT CONTRAST TECHNIQUE: Multiplanar, multisequence MR imaging of the lumbar spine was performed. No intravenous contrast was administered. COMPARISON:  CT abdomen and pelvis  06/28/2015. FINDINGS: Normal signal is present in the conus medullaris which terminates at L1. Marrow signal, vertebral body heights, alignment are normal. There is some straightening of the normal lumbar lordosis, unchanged. No significant focal disc protrusion or stenosis is present. There is no significant facet disease. The central canal and foramina are patent throughout the lumbar spine. Limited imaging of the abdomen is unremarkable. IMPRESSION: Negative MRI of the lumbar spine. Electronically Signed   By: Marin Roberts M.D.   On: 09/17/2015 11:05    MDM  Plan given crutches to go. Follow-up with chronic [facility] Final diagnoses:  None   he can go from here directly to the [facility] and community wellness [facility] to get prescription for Tylenol 3. Appointment has been scheduled for him at the wellness center for 09/20/2015 at 2:15 PM Diagnosis lumbar radiculopathy      Doug Sou, MD 09/17/15 1135

## 2015-09-17 NOTE — ED Notes (Signed)
P[t given coke, getting dressed

## 2015-09-17 NOTE — Discharge Instructions (Signed)
Crutch Use Go directly to [facility] from here to pick up your prescription for Tylenol 3. An [facility] for Thursday, 09/20/2015 at 2:15 PM Crutches take weight off one of your legs or feet when you stand or walk. It is important to use crutches that fit right. Your crutches fit right if:  You can fit 2-3 fingers between your armpit and the crutch.  You use your hands, not your armpits, to hold yourself up. Do not put your armpits on the crutches. This can damage the nerves in your hands and arms. Crutches should be a little below your armpits. HOW TO USE YOUR CRUTCHES Walking 1. Step with the crutches. 2. Swing the good leg a little bit in front of the crutches. Going Up Steps If there is no handrail: 1. Step up with the good leg. 2. Step up with the crutches and hurt leg. 3. Continue in this way. If there is a handrail: 1. Hold both crutches in one hand. 2. Place your free hand on the handrail. 3. Put your weight on your arms and lift your good leg to the step. 4. Bring the crutches and the hurt leg up to that step. 5. Continue in this way. Going Down Steps Be very careful, as going down stairs with crutches is very challenging. If there is no handrail: 1. Step down with the hurt leg and crutches. 2. Step down with the good leg. If there is a handrail: 1. Place your hand on the handrail. 2. Hold both crutches with your free hand. 3. Lower your hurt leg and crutch to the step below you. Make sure to keep the crutch tips in the center of the step, never on the edge. 4. Lower your good leg to that step. 5. Continue in this way. Standing Up 1. Hold the hurt leg forward. 2. Grab the armrest with one hand and the top of the crutches with the other hand. 3. Pull yourself up to a standing position. Sitting Down 1. Hold the hurt leg forward. 2. Grab the armrest with one  hand and the top of the crutches with the other hand. 3.  Lower yourself to a sitting position. GET HELP IF:  You still feel wobbly on your feet.  You develop new pain, for example in your armpits, back, shoulder, wrist, or hip.  You cannot feel a part of your body (numb).  You have tingling. GET HELP RIGHT AWAY IF: You fall.   This information is not intended to replace advice given to you by your health care provider. Make sure you discuss any questions you have with your health care provider.   Document Released: 12/10/2007 Document Revised: 04/13/2013 Document Reviewed: 02/28/2013 Elsevier Interactive Patient Education [employer] Elsevier Inc.

## 2015-09-17 NOTE — ED Notes (Signed)
To mRI

## 2015-09-17 NOTE — ED Notes (Signed)
Pt c/o right leg pain onset the 3rd of this month. Pt reports seen here Friday for same and was given prescription for antibiotics and pain. Pt does not have any more pain medication and reports it was not helping. Pt reports the antibiotics cleared up the folliculitis that he had. The right leg pain now radiates into abdomen.

## 2015-09-17 NOTE — Telephone Encounter (Signed)
Called back to Dr. Ethelda ChickJacubowitz. Patient in ED complaining of R flank and tesitcular pain  Plan: Tylenol #3 refilled and ready for pick up today Phenergan refilled Patient will have close f/u with me on 09/20/15 at 2:15 PM.

## 2015-09-17 NOTE — Telephone Encounter (Signed)
Dr from ER Call requesting a call back [phone]

## 2015-09-17 NOTE — ED Notes (Signed)
Back from MRI.

## 2015-09-17 NOTE — Telephone Encounter (Signed)
Rx refilled.

## 2015-09-18 ENCOUNTER — Emergency Department (INDEPENDENT_AMBULATORY_CARE_PROVIDER_SITE_OTHER)
Admission: EM | Admit: 2015-09-18 | Discharge: 2015-09-18 | Disposition: A | Discharge status: Home / Self Care | Payer: No Typology Code available for payment source | Source: Home / Self Care | Attending: Family Medicine | Admitting: Family Medicine

## 2015-09-18 ENCOUNTER — Encounter (HOSPITAL_COMMUNITY): Payer: Self-pay | Admitting: Emergency Medicine

## 2015-09-18 DIAGNOSIS — M79604 Pain in right leg: Secondary | ICD-10-CM

## 2015-09-18 NOTE — ED Notes (Signed)
Patient seen in emergency department yesterday for the same. Patient had an mri yesterday.  Patient reports pain is not touched by pain medications that he takes daily.  Patient denies an injury.  Pain involves right thigh and hip.  Appears to have an appt 3/16 with his pcp per epic notes

## 2015-09-18 NOTE — Discharge Instructions (Signed)
Musculoskeletal Pain °Musculoskeletal pain is muscle and boney aches and pains. These pains can occur in any part of the body. Your caregiver may treat you without knowing the cause of the pain. They may treat you if blood or urine tests, X-rays, and other tests were normal.  °CAUSES °There is often not a definite cause or reason for these pains. These pains may be caused by a type of germ (virus). The discomfort may also come from overuse. Overuse includes working out too hard when your body is not fit. Boney aches also come from weather changes. Bone is sensitive to atmospheric pressure changes. °HOME CARE INSTRUCTIONS  °· Ask when your test results will be ready. Make sure you get your test results. °· Only take over-the-counter or prescription medicines for pain, discomfort, or fever as directed by your caregiver. If you were given medications for your condition, do not drive, operate machinery or power tools, or sign legal documents for 24 hours. Do not drink alcohol. Do not take sleeping pills or other medications that may interfere with treatment. °· Continue all activities unless the activities cause more pain. When the pain lessens, slowly resume normal activities. Gradually increase the intensity and duration of the activities or exercise. °· During periods of severe pain, bed rest may be helpful. Lay or sit in any position that is comfortable. °· Putting ice on the injured area. °· Put ice in a bag. °· Place a towel between your skin and the bag. °· Leave the ice on for 15 to 20 minutes, 3 to 4 times a day. °· Follow up with your caregiver for continued problems and no reason can be found for the pain. If the pain becomes worse or does not go away, it may be necessary to repeat tests or do additional testing. Your caregiver may need to look further for a possible cause. °SEEK IMMEDIATE MEDICAL CARE IF: °· You have pain that is getting worse and is not relieved by medications. °· You develop chest pain  that is associated with shortness or breath, sweating, feeling sick to your stomach (nauseous), or throw up (vomit). °· Your pain becomes localized to the abdomen. °· You develop any new symptoms that seem different or that concern you. °MAKE SURE YOU:  °· Understand these instructions. °· Will watch your condition. °· Will get help right away if you are not doing well or get worse. °  °This information is not intended to replace advice given to you by your health care provider. Make sure you discuss any questions you have with your health care provider. °  °Document Released: 06/23/2005 Document Revised: 09/15/2011 Document Reviewed: 02/25/2013 °Elsevier Interactive Patient Education ©2016 Elsevier Inc. ° °Pain Without a Known Cause °WHAT IS PAIN WITHOUT A KNOWN CAUSE? °Pain can occur in any part of the body and can range from mild to severe. Sometimes no cause can be found for why you are having pain. Some types of pain that can occur without a known cause include:  °· Headache. °· Back pain. °· Abdominal pain. °· Neck pain. °HOW IS PAIN WITHOUT A KNOWN CAUSE DIAGNOSED?  °Your health care provider will try to find the cause of your pain. This may include: °· Physical exam. °· Medical history. °· Blood tests. °· Urine tests. °· X-rays. °If no cause is found, your health care provider may diagnose you with pain without a known cause.  °IS THERE TREATMENT FOR PAIN WITHOUT A CAUSE?  °Treatment depends on the kind of   pain you have. Your health care provider may prescribe medicines to help relieve your pain.  °WHAT CAN I DO AT HOME FOR MY PAIN?  °· Take medicines only as directed by your health care provider. °· Stop any activities that cause pain. During periods of severe pain, bed rest may help. °· Try to reduce your stress with activities such as yoga or meditation. Talk to your health care provider for other stress-reducing activity recommendations. °· Exercise regularly, if approved by your health care  provider. °· Eat a healthy diet that includes fruits and vegetables. This may improve pain. Talk to your health care provider if you have any questions about your diet. °WHAT IF MY PAIN DOES NOT GET BETTER?  °If you have a painful condition and no reason can be found for the pain or the pain gets worse, it is important to follow up with your health care provider. It may be necessary to repeat tests and look further for a possible cause.  °  °This information is not intended to replace advice given to you by your health care provider. Make sure you discuss any questions you have with your health care provider. °  °Document Released: 03/18/2001 Document Revised: 07/14/2014 Document Reviewed: 11/08/2013 °Elsevier Interactive Patient Education ©2016 Elsevier Inc. ° °

## 2015-09-18 NOTE — ED Provider Notes (Signed)
CSN: [id number]     Arrival date & time 09/18/15  1325 History   First MD Initiated Contact with Patient 09/18/15 1525     Chief Complaint  Patient presents with  . Leg Pain   (Consider location/radiation/quality/duration/timing/severity/associated sxs/prior Treatment) HPI Comments: 30 year old male presents to the urgent care with right leg pain in his head for 11 days. This is the fourth occurrence in the past year. He denies any known injury. He states this is recurring. He has seen his PCP several times for this leg pain and he has been to an orthopedist. He also states that nothing makes the pain worse but later stated light touch to the scan and just wearing clothes produces pain to the skin and the muscle. Ambulation and weightbearing increases the pain. The pain is constant. Nothing seems to make it worse. He was seen yesterday evening at the emergency department where he had a CT of his spine as well as lab work. He was given IV morphine and his PCP was called. A prescription by the PCP was called in for Tylenol 3. He states that this medicine is not working and he is not taking it for his right leg. He did not state why he was taking it for. He later stated that prescription was part of the plan to help with his pain and he agreed. is not helping his right leg. It was agreed upon last night in the ER by the patient and the provider that the Tylenol 3 was part of the plan to help with his pain. He has an appointment with his PCP and 2 or 3 days. He states that they are not doing enough for him to find out what is going on with his leg. He came here to receive a diagnosis.   Past Medical History  Diagnosis Date  . Medical history non-contributory   . Anxiety   . Panic attack as reaction to stress   . Anemia   . Depression   . Gallstones   . Dysrhythmia     irregular heart rate - due to zoloft  . Bipolar disorder (HCC)   . Cirrhosis (HCC)   . Constipation    Past Surgical History   Procedure Laterality Date  . Cholecystectomy    . Tonsillectomy    . Incise and drain abcess    . Mouth surgery    . Esophagogastroduodenoscopy N/A 05/22/2015    Procedure: ESOPHAGOGASTRODUODENOSCOPY (EGD);  Surgeon: Vida Rigger, MD;  Location: [facility] ENDOSCOPY;  Service: Endoscopy;  Laterality: N/A;   Family History  Problem Relation Age of Onset  . Irritable bowel syndrome Mother   . Kidney disease Mother   . Ulcerative colitis Maternal Grandfather   . Colon cancer Neg Hx   . Colon polyps Neg Hx   . Gallbladder disease Neg Hx   . Esophageal cancer Neg Hx   . Diabetes Neg Hx    Social History  Substance Use Topics  . Smoking status: Never Smoker   . Smokeless tobacco: Never Used  . Alcohol Use: No    Review of Systems  Constitutional: Positive for activity change. Negative for fever.  Respiratory: Negative.   Musculoskeletal: Positive for myalgias. Negative for joint swelling.  Skin: Negative for color change, pallor, rash and wound.  Neurological: Negative for dizziness.  Psychiatric/Behavioral: Positive for behavioral problems and sleep disturbance. The patient is nervous/anxious.     Allergies  Reglan; Toradol; Tramadol; and Zoloft  Home Medications   Prior to  Admission medications   Medication Sig Start Date End Date Taking? Authorizing Provider  acetaminophen-codeine (TYLENOL #3) 300-30 MG tablet Take 1 tablet by mouth every 6 (six) hours as needed for moderate pain. 09/17/15   Josalyn Funches, MD  chlorhexidine gluconate (PERIDEX) 0.12 % solution Use as directed 15 mLs in the mouth or throat 2 (two) times daily. 08/25/15   Joycie Peek, PA-C  clonazePAM (KLONOPIN) 1 MG tablet Take 1 tablet (1 mg total) by mouth 2 (two) times daily. 09/03/15   Josalyn Funches, MD  diclofenac sodium (VOLTAREN) 1 % GEL Apply 2 g topically daily as needed (joint pain). Reported on 08/06/2015    Historical Provider, MD  diphenhydrAMINE (BENADRYL) 25 mg capsule Take 50 mg by mouth at  bedtime as needed for sleep.    Historical Provider, MD  doxycycline (VIBRAMYCIN) 100 MG capsule Take 1 capsule (100 mg total) by mouth 2 (two) times daily. 09/14/15   Cathren Laine, MD  ferrous sulfate 325 (65 FE) MG tablet Take 1 tablet (325 mg total) by mouth 2 (two) times daily with a meal. 07/12/15   Dessa Phi, MD  HYDROcodone-acetaminophen (NORCO/VICODIN) 5-325 MG tablet Take 1-2 tablets by mouth every 6 (six) hours as needed for moderate pain. 09/14/15   Cathren Laine, MD  hyoscyamine (LEVSIN SL) 0.125 MG SL tablet Place 0.125 mg under the tongue every 4 (four) hours as needed for cramping. Reported on 09/03/2015    Historical Provider, MD  ibuprofen (ADVIL,MOTRIN) 200 MG tablet Take 400-800 mg by mouth every 6 (six) hours as needed for moderate pain.     Historical Provider, MD  Linaclotide Karlene Einstein) 145 MCG CAPS capsule Take 2 capsules (290 mcg total) by mouth daily. Dr. Ewing Schlein 05/29/15   Dessa Phi, MD  promethazine (PHENERGAN) 25 MG tablet Take 1 tablet (25 mg total) by mouth every 8 (eight) hours as needed for nausea or vomiting. 09/17/15   Dessa Phi, MD   Meds Ordered and Administered this Visit  Medications - No data to display  BP 114/71 mmHg  Pulse 93  Temp(Src) 97.7 F (36.5 C) (Oral)  Resp 16  SpO2 94% No data found.   Physical Exam  Constitutional: He is oriented to person, place, and time. He appears well-developed and well-nourished. No distress.  Eyes: EOM are normal.  Neck: Normal range of motion. Neck supple.  Cardiovascular: Normal rate.   Pulmonary/Chest: Effort normal. No respiratory distress.  Musculoskeletal: He exhibits no edema.  Patient demonstrates normal range of motion of the right lower extremity although the hip is only partially tested. In the right anterior thigh is the area of pain. Light touch to the hair on the leg as well as the scan produces severe pain to the leg. Palpation of the quadriceps muscles produces pain. Palpation to the  hamstring does not produce pain. Patient is able to extend the knee with good strength. There is no discoloration, swelling, deformity, lesions or other abnormal observations.  Neurological: He is alert and oriented to person, place, and time. He exhibits normal muscle tone.  Skin: Skin is warm and dry.  Psychiatric: His mood appears anxious. His affect is blunt. His speech is not slurred. He is agitated, aggressive and combative. Thought content is paranoid. He expresses impulsivity.  Nursing note and vitals reviewed.   ED Course  Procedures (including critical care time)  Labs Review Labs Reviewed - No data to display  Imaging Review Mr Lumbar Spine Wo Contrast  09/17/2015  CLINICAL DATA:  New onset  right lower extremity pain extending to the abdomen. Recent diagnosis of folliculitis. The patient has been on antibiotics. The medications are not helping. EXAM: MRI LUMBAR SPINE WITHOUT CONTRAST TECHNIQUE: Multiplanar, multisequence MR imaging of the lumbar spine was performed. No intravenous contrast was administered. COMPARISON:  CT abdomen and pelvis 06/28/2015. FINDINGS: Normal signal is present in the conus medullaris which terminates at L1. Marrow signal, vertebral body heights, alignment are normal. There is some straightening of the normal lumbar lordosis, unchanged. No significant focal disc protrusion or stenosis is present. There is no significant facet disease. The central canal and foramina are patent throughout the lumbar spine. Limited imaging of the abdomen is unremarkable. IMPRESSION: Negative MRI of the lumbar spine. Electronically Signed   By: Marin Robertshristopher  Mattern M.D.   On: 09/17/2015 11:05     Visual Acuity Review  Right Eye Distance:   Left Eye Distance:   Bilateral Distance:    Right Eye Near:   Left Eye Near:    Bilateral Near:         MDM   1. Right leg pain    the patient received Tylenol 3 last evening for pain. He later states he was taking the pain  medicine for something else and not his right leg pain although it was agreed that he would be taking this for his leg pain when in the ER last night. The patient was advised that we would not be able to perform any additional testing in the urgent care to diagnose the pain in his right leg. I offered other medication such as prednisone and gabapentin. He states that he has been on prednisone and refuses to take the gabapentin because it is poison to the body. I told him I did not know of any  other medications that he can take or would take for pain except narcotics. I also advised him since he was taking Tylenol 3 and he was receiving this from another physician who was taking care of him for this particular problem that I could not offer him narcotics. At this point he became defensive, cursing, telling me that he has been disrespected that I did not ask him what his goals in life were and I did not ask him what he wanted. At this point he became irate, erratic, and it was difficult to understand the  content or context of his ranting and cursing language. He has a history of depression, anxiety, panic disorder and bipolar disorder. He asked for his papers, grabbed his crutches and briskly walked out of the building, cursing as he left.     Hayden Rasmussenavid Josselin Gaulin, NP 09/18/15 1655

## 2015-09-19 NOTE — ED Notes (Signed)
Returned pt's call... Pt voiced concern about the way he was treated at yest visit... Adv pt that he needs to speak to Merril AbbeMelissa B, [employer] Melissa reports he was cursing profanity at the staff as he walked out Efraim KaufmannMelissa was going to pick up the phone but pt had hanged the phone.

## 2015-09-20 ENCOUNTER — Ambulatory Visit: Payer: No Typology Code available for payment source | Attending: Family Medicine | Admitting: Family Medicine

## 2015-09-20 ENCOUNTER — Encounter: Payer: Self-pay | Admitting: Family Medicine

## 2015-09-20 VITALS — BP 102/64 | HR 101 | Temp 97.9°F | Resp 16 | Ht 66.0 in | Wt 134.0 lb

## 2015-09-20 DIAGNOSIS — R109 Unspecified abdominal pain: Secondary | ICD-10-CM | POA: Insufficient documentation

## 2015-09-20 DIAGNOSIS — R209 Unspecified disturbances of skin sensation: Secondary | ICD-10-CM | POA: Insufficient documentation

## 2015-09-20 DIAGNOSIS — E559 Vitamin D deficiency, unspecified: Secondary | ICD-10-CM

## 2015-09-20 DIAGNOSIS — F419 Anxiety disorder, unspecified: Secondary | ICD-10-CM

## 2015-09-20 DIAGNOSIS — K589 Irritable bowel syndrome without diarrhea: Secondary | ICD-10-CM | POA: Insufficient documentation

## 2015-09-20 DIAGNOSIS — R202 Paresthesia of skin: Secondary | ICD-10-CM

## 2015-09-20 DIAGNOSIS — Z79899 Other long term (current) drug therapy: Secondary | ICD-10-CM | POA: Insufficient documentation

## 2015-09-20 MED ORDER — VITAMIN D (ERGOCALCIFEROL) 1.25 MG (50000 UNIT) PO CAPS: 50000.0000 [IU] | ORAL_CAPSULE | ORAL | Status: DC

## 2015-09-20 MED ORDER — CLONAZEPAM 1 MG PO TABS: 1.0000 mg | ORAL_TABLET | Freq: Two times a day (BID) | ORAL | Status: DC

## 2015-09-20 MED ORDER — PREGABALIN 50 MG PO CAPS: 50.0000 mg | ORAL_CAPSULE | Freq: Three times a day (TID) | ORAL | Status: AC

## 2015-09-20 MED ORDER — GABAPENTIN 100 MG PO CAPS: 100.0000 mg | ORAL_CAPSULE | Freq: Three times a day (TID) | ORAL | Status: DC

## 2015-09-20 NOTE — Progress Notes (Signed)
Subjective:  Patient ID: Levi Palmer, male    DOB: 07-Jan-1986  Age: 30 y.o. MRN: [id number]  CC: Flank Pain   HPI Levi Palmer has anxiety, depression, chronic abdominal pain, ? IBS, presents for    1. R flank pain: pain down to rib to leg. Pain is R sided only. Burning sensation. Does not cross midline. No rash. No trauma. Tylenol #3 and klonopin do not help with pain. Went to ED on 09/17/15. Had normal MRI.  Went to UC on 09/18/14, no treatment. No fever or chills.   Social History  Substance Use Topics  . Smoking status: Never Smoker   . Smokeless tobacco: Never Used  . Alcohol Use: No    Outpatient Prescriptions Prior to Visit  Medication Sig Dispense Refill  . acetaminophen-codeine (TYLENOL #3) 300-30 MG tablet Take 1 tablet by mouth every 6 (six) hours as needed for moderate pain. 120 tablet 0  . chlorhexidine gluconate (PERIDEX) 0.12 % solution Use as directed 15 mLs in the mouth or throat 2 (two) times daily. 120 mL 0  . clonazePAM (KLONOPIN) 1 MG tablet Take 1 tablet (1 mg total) by mouth 2 (two) times daily. 60 tablet 0  . diclofenac sodium (VOLTAREN) 1 % GEL Apply 2 g topically daily as needed (joint pain). Reported on 08/06/2015    . diphenhydrAMINE (BENADRYL) 25 mg capsule Take 50 mg by mouth at bedtime as needed for sleep.    Marland Kitchen. doxycycline (VIBRAMYCIN) 100 MG capsule Take 1 capsule (100 mg total) by mouth 2 (two) times daily. 14 capsule 0  . hyoscyamine (LEVSIN SL) 0.125 MG SL tablet Place 0.125 mg under the tongue every 4 (four) hours as needed for cramping. Reported on 09/03/2015    . ibuprofen (ADVIL,MOTRIN) 200 MG tablet Take 400-800 mg by mouth every 6 (six) hours as needed for moderate pain.     . Linaclotide (LINZESS) 145 MCG CAPS capsule Take 2 capsules (290 mcg total) by mouth daily. Dr. Ewing SchleinMagod 90 capsule 3  . promethazine (PHENERGAN) 25 MG tablet Take 1 tablet (25 mg total) by mouth every 8 (eight) hours as needed for nausea or vomiting. 60 tablet 2  .  ferrous sulfate 325 (65 FE) MG tablet Take 1 tablet (325 mg total) by mouth 2 (two) times daily with a meal. (Patient not taking: Reported on 09/20/2015) 60 tablet 3  . HYDROcodone-acetaminophen (NORCO/VICODIN) 5-325 MG tablet Take 1-2 tablets by mouth every 6 (six) hours as needed for moderate pain. (Patient not taking: Reported on 09/20/2015) 10 tablet 0   No facility-administered medications prior to visit.    ROS Review of Systems  Constitutional: Negative for fever, chills, fatigue and unexpected weight change.  HENT: Positive for dental problem.   Eyes: Negative for visual disturbance.  Respiratory: Negative for cough and shortness of breath.   Cardiovascular: Negative for chest pain, palpitations and leg swelling.  Gastrointestinal: Negative for nausea, vomiting, diarrhea, constipation and blood in stool.  Endocrine: Negative for polydipsia, polyphagia and polyuria.  Genitourinary: Negative for dysuria, urgency, frequency, hematuria, flank pain (R sided ), decreased urine volume, discharge, penile swelling, scrotal swelling, genital sores, penile pain and testicular pain.  Musculoskeletal: Positive for myalgias. Negative for back pain, arthralgias, gait problem and neck pain.  Skin: Negative for rash.  Allergic/Immunologic: Negative for immunocompromised state.  Hematological: Negative for adenopathy. Does not bruise/bleed easily.  Psychiatric/Behavioral: Positive for dysphoric mood. Negative for suicidal ideas and sleep disturbance. The patient is nervous/anxious.  Objective:  BP 102/64 mmHg  Pulse 101  Temp(Src) 97.9 F (36.6 C) (Oral)  Resp 16  Ht  (1.676 m)  Wt 134 lb (60.782 kg)  BMI 21.64 kg/m2  SpO2 97%  BP/Weight 09/20/2015 09/18/2015 09/17/2015  Systolic BP 102 114 122  Diastolic BP 64 71 87  Wt. (Lbs) 134 - 130  BMI 21.64 - 20.36   Physical Exam  Constitutional: He appears well-developed and well-nourished. No distress.  HENT:  Head: Normocephalic and  atraumatic.  Mouth/Throat: No oral lesions. Abnormal dentition. Dental caries present. No dental abscesses.  Multiple broken teeth   Neck: Normal range of motion. Neck supple.  Cardiovascular: Normal rate, regular rhythm, normal heart sounds and intact distal pulses.   Pulmonary/Chest: Effort normal and breath sounds normal.  Abdominal: Soft. Bowel sounds are normal. He exhibits no distension and no mass. There is no hepatosplenomegaly, splenomegaly or hepatomegaly. There is no tenderness. There is no rebound, no guarding and no CVA tenderness. No hernia. Hernia confirmed negative in the ventral area, confirmed negative in the right inguinal area and confirmed negative in the left inguinal area.  Genitourinary: Testes normal and penis normal. Right testis shows no mass and no swelling. Left testis shows no mass and no swelling. Uncircumcised.  Musculoskeletal: He exhibits no edema.       Right hip: Normal.       Legs: Neurological: He is alert.  Skin: Skin is warm and dry. No rash noted. No erythema.  No rash or erythema on R side   Psychiatric: He exhibits a depressed mood.   Assessment & Plan:   There are no diagnoses linked to this encounter. Levi Palmer was seen today for flank pain.  Diagnoses and all orders for this visit:  Vitamin D insufficiency -     Vitamin D, Ergocalciferol, (DRISDOL) 50000 units CAPS capsule; Take 1 capsule (50,000 Units total) by mouth every 7 (seven) days. For 8 weeks  Paresthesia of right leg -     gabapentin (NEURONTIN) 100 MG capsule; Take 1 capsule (100 mg total) by mouth 3 (three) times daily. -     pregabalin (LYRICA) 50 MG capsule; Take 1 capsule (50 mg total) by mouth 3 (three) times daily.  Anxiety -     Discontinue: clonazePAM (KLONOPIN) 1 MG tablet; Take 1 tablet (1 mg total) by mouth 2 (two) times daily. -     clonazePAM (KLONOPIN) 1 MG tablet; Take 1 tablet (1 mg total) by mouth 2 (two) times daily. Fill on 10/01/15   Meds ordered this  encounter  Medications  . gabapentin (NEURONTIN) 100 MG capsule    Sig: Take 1 capsule (100 mg total) by mouth 3 (three) times daily.    Dispense:  90 capsule    Refill:  3  . pregabalin (LYRICA) 50 MG capsule    Sig: Take 1 capsule (50 mg total) by mouth 3 (three) times daily.    Dispense:  90 capsule    Refill:  0  . Vitamin D, Ergocalciferol, (DRISDOL) 50000 units CAPS capsule    Sig: Take 1 capsule (50,000 Units total) by mouth every 7 (seven) days. For 8 weeks    Dispense:  8 capsule    Refill:  0  . DISCONTD: clonazePAM (KLONOPIN) 1 MG tablet    Sig: Take 1 tablet (1 mg total) by mouth 2 (two) times daily.    Dispense:  60 tablet    Refill:  0  . clonazePAM (KLONOPIN) 1 MG tablet  Sig: Take 1 tablet (1 mg total) by mouth 2 (two) times daily. Fill on 10/01/15    Dispense:  60 tablet    Refill:  0    Follow-up: No Follow-up on file.   Dessa Phi MD

## 2015-09-20 NOTE — Assessment & Plan Note (Signed)
A; R sided paresthesias with normal MRI and normal exam. ? Anxiety induced. ? Vit D deficiency P: Gabapentin lyrica via [facility] Refilled klonopin  Check vit D level

## 2015-09-20 NOTE — Patient Instructions (Addendum)
Levi Palmer was seen today for flank pain.  Diagnoses and all orders for this visit:  Vitamin D insufficiency -     Vitamin D, Ergocalciferol, (DRISDOL) 50000 units CAPS capsule; Take 1 capsule (50,000 Units total) by mouth every 7 (seven) days. For 8 weeks  Paresthesia of right leg -     gabapentin (NEURONTIN) 100 MG capsule; Take 1 capsule (100 mg total) by mouth 3 (three) times daily. -     pregabalin (LYRICA) 50 MG capsule; Take 1 capsule (50 mg total) by mouth 3 (three) times daily.  Anxiety -     clonazePAM (KLONOPIN) 1 MG tablet; Take 1 tablet (1 mg total) by mouth 2 (two) times daily.  hold on to the klonopin until due for refill Start gabapentin to calm down burning pain on R side. Start with 100 mg at night for first 2-3 days, then 100 mg twice daily, then 100 mg three times daily   Drop off lyrica Rx at the pharmacy  F/u in 3 weeks for paresthesias  Paresthesia Paresthesia is an abnormal burning or prickling sensation. This sensation is generally felt in the hands, arms, legs, or feet. However, it may occur in any part of the body. Usually, it is not painful. The feeling may be described as:  Tingling or numbness.  Pins and needles.  Skin crawling.  Buzzing.  Limbs falling asleep.  Itching. Most people experience temporary (transient) paresthesia at some time in their lives. Paresthesia may occur when you breathe too quickly (hyperventilation). It can also occur without any apparent cause. Commonly, paresthesia occurs when pressure is placed on a nerve. The sensation quickly goes away after the pressure is removed. For some people, however, paresthesia is a long-lasting (chronic) condition that is caused by an underlying disorder. If you continue to have paresthesia, you may need further medical evaluation. HOME CARE INSTRUCTIONS Watch your condition for any changes. Taking the following actions may help to lessen any discomfort that you are feeling:  Avoid drinking  alcohol.  Try acupuncture or massage to help relieve your symptoms.  Keep all follow-up visits as directed by your health care provider. This is important. SEEK MEDICAL CARE IF:  You continue to have episodes of paresthesia.  Your burning or prickling feeling gets worse when you walk.  You have pain, cramps, or dizziness.  You develop a rash. SEEK IMMEDIATE MEDICAL CARE IF:  You feel weak.  You have trouble walking or moving.  You have problems with speech, understanding, or vision.  You feel confused.  You cannot control your bladder or bowel movements.  You have numbness after an injury.  You faint.   This information is not intended to replace advice given to you by your health care provider. Make sure you discuss any questions you have with your health care provider.   Document Released: 06/13/2002 Document Revised: 11/07/2014 Document Reviewed: 06/19/2014 Elsevier Interactive Patient Education [employer]2016 Elsevier Inc.

## 2015-09-20 NOTE — Progress Notes (Signed)
F/U pain on Rt rib area down to rt leg  Pain scale # 10 No tobacco user  No suicidal thoughts thoughts

## 2015-09-21 ENCOUNTER — Encounter: Payer: Self-pay | Admitting: Clinical

## 2015-09-21 NOTE — Progress Notes (Signed)
[facility] 09/20/2015 09/03/2015 09/03/2015 08/06/2015 08/06/2015  Decreased Interest 3 2 2 3 3   Down, Depressed, Hopeless 3 3 3 3 3   PHQ - 2 Score 6 5 5 6 6   Altered sleeping 2 1 1 3 3   Tired, decreased energy 3 3 3 3 3   Change in appetite 3 3 3 3 3   Feeling bad or failure about yourself  3 3 3 3 3   Trouble concentrating 3 2 2 3 3   Moving slowly or fidgety/restless 3 3 3 3 2   Suicidal thoughts 3 0 0 3 1  PHQ-9 Score 26 20 20 27 24    * No plans to end life in last 2 weeks GAD 7 : Generalized Anxiety Score 09/20/2015 09/03/2015 09/03/2015 08/06/2015  Nervous, Anxious, on Edge 3 3 3 3   Control/stop worrying 3 3 3 3   Worry too much - different things 3 2 2 3   Trouble relaxing 3 3 3 3   Restless 3 0 0 3  Easily annoyed or irritable 3 1 1 3   Afraid - awful might happen 2 2 2 3   Total GAD 7 Score 20 14 14 21   Anxiety Difficulty - - - -

## 2015-10-09 MED FILL — PROMETHAZINE 25 MG TABLET: 25 | 20 days supply | Qty: 60 | Fill #2

## 2015-10-09 MED FILL — ?LINZESS 290 MCG CAPSULE: 290 | 30 days supply | Qty: 30 | Fill #3

## 2015-10-10 ENCOUNTER — Encounter (HOSPITAL_COMMUNITY): Payer: Self-pay

## 2015-10-10 ENCOUNTER — Ambulatory Visit: Payer: No Typology Code available for payment source | Attending: Family Medicine | Admitting: Clinical

## 2015-10-10 ENCOUNTER — Emergency Department (HOSPITAL_COMMUNITY)
Admission: EM | Admit: 2015-10-10 | Discharge: 2015-10-10 | Disposition: A | Discharge status: Home / Self Care | Payer: No Typology Code available for payment source | Attending: Emergency Medicine | Admitting: Emergency Medicine

## 2015-10-10 DIAGNOSIS — Z79899 Other long term (current) drug therapy: Secondary | ICD-10-CM | POA: Insufficient documentation

## 2015-10-10 DIAGNOSIS — F419 Anxiety disorder, unspecified: Principal | ICD-10-CM

## 2015-10-10 DIAGNOSIS — F329 Major depressive disorder, single episode, unspecified: Secondary | ICD-10-CM

## 2015-10-10 DIAGNOSIS — R109 Unspecified abdominal pain: Secondary | ICD-10-CM | POA: Insufficient documentation

## 2015-10-10 DIAGNOSIS — F319 Bipolar disorder, unspecified: Secondary | ICD-10-CM | POA: Insufficient documentation

## 2015-10-10 DIAGNOSIS — M542 Cervicalgia: Secondary | ICD-10-CM | POA: Insufficient documentation

## 2015-10-10 DIAGNOSIS — Z862 Personal history of diseases of the blood and blood-forming organs and certain disorders involving the immune mechanism: Secondary | ICD-10-CM | POA: Insufficient documentation

## 2015-10-10 DIAGNOSIS — G8929 Other chronic pain: Secondary | ICD-10-CM | POA: Insufficient documentation

## 2015-10-10 DIAGNOSIS — F418 Other specified anxiety disorders: Secondary | ICD-10-CM

## 2015-10-10 DIAGNOSIS — M79604 Pain in right leg: Secondary | ICD-10-CM | POA: Insufficient documentation

## 2015-10-10 DIAGNOSIS — Z8679 Personal history of other diseases of the circulatory system: Secondary | ICD-10-CM | POA: Insufficient documentation

## 2015-10-10 DIAGNOSIS — M79605 Pain in left leg: Secondary | ICD-10-CM | POA: Insufficient documentation

## 2015-10-10 DIAGNOSIS — F410 Panic disorder [episodic paroxysmal anxiety] without agoraphobia: Secondary | ICD-10-CM | POA: Insufficient documentation

## 2015-10-10 DIAGNOSIS — Z8719 Personal history of other diseases of the digestive system: Secondary | ICD-10-CM | POA: Insufficient documentation

## 2015-10-10 MED ORDER — NAPROXEN 250 MG PO TABS: 250.0000 mg | ORAL_TABLET | Freq: Two times a day (BID) | ORAL | Status: DC

## 2015-10-10 MED ORDER — HYDROCODONE-ACETAMINOPHEN 5-325 MG PO TABS
1.0000 | ORAL_TABLET | Freq: Once | ORAL | Status: AC
  Administered 2015-10-10: 1 via ORAL
  Filled 2015-10-10: qty 1

## 2015-10-10 NOTE — ED Notes (Signed)
Pt here with multiple complaints. Primary complaint right leg pain that has been ongoing X1 month. He reports the pain has traveled up his right leg into his hip and his abdomen. He also reports recent earache and generalized weakness. Pt reports "my equilibrium has been off."

## 2015-10-10 NOTE — Discharge Instructions (Signed)
Musculoskeletal Pain °Musculoskeletal pain is muscle and boney aches and pains. These pains can occur in any part of the body. Your caregiver may treat you without knowing the cause of the pain. They may treat you if blood or urine tests, X-rays, and other tests were normal.  °CAUSES °There is often not a definite cause or reason for these pains. These pains may be caused by a type of germ (virus). The discomfort may also come from overuse. Overuse includes working out too hard when your body is not fit. Boney aches also come from weather changes. Bone is sensitive to atmospheric pressure changes. °HOME CARE INSTRUCTIONS  °· Ask when your test results will be ready. Make sure you get your test results. °· Only take over-the-counter or prescription medicines for pain, discomfort, or fever as directed by your caregiver. If you were given medications for your condition, do not drive, operate machinery or power tools, or sign legal documents for 24 hours. Do not drink alcohol. Do not take sleeping pills or other medications that may interfere with treatment. °· Continue all activities unless the activities cause more pain. When the pain lessens, slowly resume normal activities. Gradually increase the intensity and duration of the activities or exercise. °· During periods of severe pain, bed rest may be helpful. Lay or sit in any position that is comfortable. °· Putting ice on the injured area. °· Put ice in a bag. °· Place a towel between your skin and the bag. °· Leave the ice on for 15 to 20 minutes, 3 to 4 times a day. °· Follow up with your caregiver for continued problems and no reason can be found for the pain. If the pain becomes worse or does not go away, it may be necessary to repeat tests or do additional testing. Your caregiver may need to look further for a possible cause. °SEEK IMMEDIATE MEDICAL CARE IF: °· You have pain that is getting worse and is not relieved by medications. °· You develop chest pain  that is associated with shortness or breath, sweating, feeling sick to your stomach (nauseous), or throw up (vomit). °· Your pain becomes localized to the abdomen. °· You develop any new symptoms that seem different or that concern you. °MAKE SURE YOU:  °· Understand these instructions. °· Will watch your condition. °· Will get help right away if you are not doing well or get worse. °  °This information is not intended to replace advice given to you by your health care provider. Make sure you discuss any questions you have with your health care provider. °  °Document Released: 06/23/2005 Document Revised: 09/15/2011 Document Reviewed: 02/25/2013 °Elsevier Interactive Patient Education ©2016 Elsevier Inc. ° °Pain Without a Known Cause °WHAT IS PAIN WITHOUT A KNOWN CAUSE? °Pain can occur in any part of the body and can range from mild to severe. Sometimes no cause can be found for why you are having pain. Some types of pain that can occur without a known cause include:  °· Headache. °· Back pain. °· Abdominal pain. °· Neck pain. °HOW IS PAIN WITHOUT A KNOWN CAUSE DIAGNOSED?  °Your health care provider will try to find the cause of your pain. This may include: °· Physical exam. °· Medical history. °· Blood tests. °· Urine tests. °· X-rays. °If no cause is found, your health care provider may diagnose you with pain without a known cause.  °IS THERE TREATMENT FOR PAIN WITHOUT A CAUSE?  °Treatment depends on the kind of   pain you have. Your health care provider may prescribe medicines to help relieve your pain.  °WHAT CAN I DO AT HOME FOR MY PAIN?  °· Take medicines only as directed by your health care provider. °· Stop any activities that cause pain. During periods of severe pain, bed rest may help. °· Try to reduce your stress with activities such as yoga or meditation. Talk to your health care provider for other stress-reducing activity recommendations. °· Exercise regularly, if approved by your health care  provider. °· Eat a healthy diet that includes fruits and vegetables. This may improve pain. Talk to your health care provider if you have any questions about your diet. °WHAT IF MY PAIN DOES NOT GET BETTER?  °If you have a painful condition and no reason can be found for the pain or the pain gets worse, it is important to follow up with your health care provider. It may be necessary to repeat tests and look further for a possible cause.  °  °This information is not intended to replace advice given to you by your health care provider. Make sure you discuss any questions you have with your health care provider. °  °Document Released: 03/18/2001 Document Revised: 07/14/2014 Document Reviewed: 11/08/2013 °Elsevier Interactive Patient Education ©2016 Elsevier Inc. ° °

## 2015-10-10 NOTE — Progress Notes (Signed)
ASSESSMENT: Pt continues to experience symptoms of anxiety and depression. Pt needs to f/u with PCP; would benefit from brief therapeutic interventions and community resources, regarding coping with symptoms of anxiety and depression.  Stage of Change: contemplative  PLAN: 1. F/U with behavioral health consultant in as needed 2. Psychiatric Medications: Klonopin 3. Behavioral recommendation(s):   -Pick up meds today in pharmacy and take as prescribed -Consider upcoming dental clinic in July 2017 ([email or web address]) with [facility] -Continue advocating for own healthcare SUBJECTIVE: Pt. referred by Dr Armen PickupFunches for symptoms of anxiety and depression:  Pt. reports the following symptoms/concerns: Pt states that he feels [facility] the only [facility] that has attempted to help him, praises Dr. Armen PickupFunches for doing "everything she can do", yet feels despair over seeing specialists who have been unable to find the source of his continual pain. He says he needs dental work, but that he does not have the $30 copay, and his teeth hurt, along with his legs, and now his abdomen hurts in a new location. He feels scared, and uncertain what else he can do to solve the issues he faces, and feels unheard by specialist medical providers. He went "several weeks" without marijuana, in hopes it may alleviate nausea, but nausea continued.  Duration of problem: At least two years Severity: severe  OBJECTIVE: Orientation & Cognition: Oriented x3. Thought processes normal and appropriate to situation. Mood: irritated (yet appropriate for level of pain), cooperative Affect: appropriate Appearance: appropriate Risk of harm to self or others: no known risk of harm to self or others today (no SI or HI) Substance use: marijuana Assessments administered: Pt declined today  Diagnosis: Anxiety and depression CPT Code: F441.8 -------------------------------------------- Other(s)  present in the room: none  Time spent with patient in exam room: 30 minutes, 9:15-9:45am   [facility] 09/20/2015 09/03/2015 09/03/2015 08/06/2015 08/06/2015  Decreased Interest 3 2 2 3 3   Down, Depressed, Hopeless 3 3 3 3 3   PHQ - 2 Score 6 5 5 6 6   Altered sleeping 2 1 1 3 3   Tired, decreased energy 3 3 3 3 3   Change in appetite 3 3 3 3 3   Feeling bad or failure about yourself  3 3 3 3 3   Trouble concentrating 3 2 2 3 3   Moving slowly or fidgety/restless 3 3 3 3 2   Suicidal thoughts 3 0 0 3 1  PHQ-9 Score 26 20 20 27 24     GAD 7 : Generalized Anxiety Score 09/20/2015 09/03/2015 09/03/2015 08/06/2015  Nervous, Anxious, on Edge 3 3 3 3   Control/stop worrying 3 3 3 3   Worry too much - different things 3 2 2 3   Trouble relaxing 3 3 3 3   Restless 3 0 0 3  Easily annoyed or irritable 3 1 1 3   Afraid - awful might happen 2 2 2 3   Total GAD 7 Score 20 14 14 21   Anxiety Difficulty - - - -

## 2015-10-10 NOTE — ED Provider Notes (Signed)
CSN: [id number]     Arrival date & time 10/10/15  0715 History   First MD Initiated Contact with Patient 10/10/15 [phone]     Chief Complaint  Patient presents with  . Leg Pain  . Abdominal Pain      Patient is a 30 y.o. male presenting with leg pain and abdominal pain. The history is provided by the patient and medical records. No language interpreter was used.  Leg Pain Associated symptoms: neck pain (chronic )   Associated symptoms: no back pain and no fever   Abdominal Pain Associated symptoms: no chest pain, no chills, no cough, no diarrhea, no dysuria, no fever, no hematuria, no shortness of breath, no sore throat and no vomiting    JOVE BEYL is a 30 y.o. male who presents to the ED complaining of bilateral leg pain for greater than one month. The patient reports he has been having trouble with bilateral leg pain for over one month and has been followed by his PCP. The patient reports he has pain that starts in his right leg that radiates up the resentment into his left leg. He reports his primary care provider provided him with Tylenol No. 3 which he has been taking intermittently. He reports this does not help with his pain. He has taken nothing for treatment of his symptoms today. He complains of 10 out of 10 leg pain today. No leg swelling. No injury or trauma. He reports intermittent tingling but no numbness or weakness. He denies any back pain. 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 No history of cancer or IV drug use.  Patient had an unremarkable MRI of his lumbar spine approximately one month ago. The patient denies fevers, trauma, back pain, numbness, weakness, loss of bladder control, loss of bowel control, urinary symptoms, hematuria, rashes, penile or testicular pain.  Past Medical History  Diagnosis Date  . Medical history non-contributory   . Anxiety   . Panic attack as reaction to stress   . Anemia   . Depression   . Gallstones    . Dysrhythmia     irregular heart rate - due to zoloft  . Bipolar disorder (HCC)   . Cirrhosis (HCC)   . Constipation    Past Surgical History  Procedure Laterality Date  . Cholecystectomy    . Tonsillectomy    . Incise and drain abcess    . Mouth surgery    . Esophagogastroduodenoscopy N/A 05/22/2015    Procedure: ESOPHAGOGASTRODUODENOSCOPY (EGD);  Surgeon: Vida Rigger, MD;  Location: [facility] ENDOSCOPY;  Service: Endoscopy;  Laterality: N/A;   Family History  Problem Relation Age of Onset  . Irritable bowel syndrome Mother   . Kidney disease Mother   . Ulcerative colitis Maternal Grandfather   . Colon cancer Neg Hx   . Colon polyps Neg Hx   . Gallbladder disease Neg Hx   . Esophageal cancer Neg Hx   . Diabetes Neg Hx    Social History  Substance Use Topics  . Smoking status: Never Smoker   . Smokeless tobacco: Never Used  . Alcohol Use: No    Review of Systems  Constitutional: Negative for fever and chills.  HENT: Negative for congestion and sore throat.   Eyes: Negative for visual disturbance.  Respiratory: Negative for cough, shortness of breath and wheezing.   Cardiovascular: Negative for chest pain and leg swelling.  Gastrointestinal: Positive for abdominal pain. Negative for vomiting and  diarrhea.  Genitourinary: Negative for dysuria, urgency, frequency, hematuria, decreased urine volume, difficulty urinating, penile pain and testicular pain.  Musculoskeletal: Positive for arthralgias and neck pain (chronic ). Negative for back pain.  Skin: Negative for rash.  Neurological: Negative for syncope, weakness, light-headedness, numbness and headaches.      Allergies  Reglan; Toradol; Tramadol; and Zoloft  Home Medications   Prior to Admission medications   Medication Sig Start Date End Date Taking? Authorizing Provider  clonazePAM (KLONOPIN) 1 MG tablet Take 1 tablet (1 mg total) by mouth 2 (two) times daily. Fill on 10/01/15 09/20/15  Yes Josalyn Funches, MD   diphenhydrAMINE (BENADRYL) 25 mg capsule Take 50 mg by mouth at bedtime as needed for sleep.   Yes Historical Provider, MD  ibuprofen (ADVIL,MOTRIN) 200 MG tablet Take 400-800 mg by mouth every 6 (six) hours as needed for moderate pain.    Yes Historical Provider, MD  promethazine (PHENERGAN) 25 MG tablet Take 1 tablet (25 mg total) by mouth every 8 (eight) hours as needed for nausea or vomiting. 09/17/15  Yes Josalyn Funches, MD  gabapentin (NEURONTIN) 100 MG capsule Take 1 capsule (100 mg total) by mouth 3 (three) times daily. Patient not taking: Reported on 10/10/2015 09/20/15   Dessa PhiJosalyn Funches, MD  hyoscyamine (LEVSIN SL) 0.125 MG SL tablet Place 0.125 mg under the tongue every 4 (four) hours as needed for cramping. Reported on 10/10/2015    Historical Provider, MD  naproxen (NAPROSYN) 250 MG tablet Take 1 tablet (250 mg total) by mouth 2 (two) times daily with a meal. 10/10/15   Everlene FarrierWilliam Madisin Hasan, PA-C  pregabalin (LYRICA) 50 MG capsule Take 1 capsule (50 mg total) by mouth 3 (three) times daily. 09/20/15   Josalyn Funches, MD  Vitamin D, Ergocalciferol, (DRISDOL) 50000 units CAPS capsule Take 1 capsule (50,000 Units total) by mouth every 7 (seven) days. For 8 weeks Patient not taking: Reported on 10/10/2015 09/20/15   Josalyn Funches, MD   BP 115/74 mmHg  Pulse 95  Temp(Src) 98.2 F (36.8 C) (Oral)  Resp 18  SpO2 100% Physical Exam  Constitutional: He appears well-developed and well-nourished. No distress.  Nontoxic appearing.  HENT:  Head: Normocephalic and atraumatic.  Mouth/Throat: Oropharynx is clear and moist.  Eyes: Conjunctivae are normal. Pupils are equal, round, and reactive to light. Right eye exhibits no discharge. Left eye exhibits no discharge.  Neck: Neck supple.  Cardiovascular: Normal rate, regular rhythm, normal heart sounds and intact distal pulses.  Exam reveals no gallop and no friction rub.   No murmur heard. Bilateral radial and posterior tibialis pulses are intact.   Pulmonary/Chest: Effort normal and breath sounds normal. No respiratory distress. He has no wheezes. He has no rales.  Abdominal: Soft. There is no tenderness. There is no guarding.  Musculoskeletal: Normal range of motion. He exhibits no edema or tenderness.  No lower extremity edema or tenderness. Patient has 5 out of 5 strength in his bilateral lower extremities. No foot drop. Patient is ambulating with one crutch. No midline neck or back tenderness.  Lymphadenopathy:    He has no cervical adenopathy.  Neurological: He is alert. He has normal reflexes. He displays normal reflexes. Coordination normal.  Patient sensation is intact in his bilateral upper and lower extremities. No foot drop with ambulation. He is ambulatory with one crutch with an antalgic gait. Bilateral patellar DTRs are intact.  Skin: Skin is warm and dry. No rash noted. He is not diaphoretic. No erythema. No pallor.  Psychiatric: He has a normal mood and affect. His behavior is normal.  Nursing note and vitals reviewed.   ED Course  Procedures (including critical care time) Labs Review Labs Reviewed - No data to display  Imaging Review No results found.    EKG Interpretation None      Filed Vitals:   10/10/15 0722  BP: 115/74  Pulse: 95  Temp: 98.2 F (36.8 C)  TempSrc: Oral  Resp: 18  SpO2: 100%     MDM   Meds given in ED:  Medications  HYDROcodone-acetaminophen (NORCO/VICODIN) 5-325 MG per tablet 1 tablet (1 tablet Oral Given 10/10/15 0810)    New Prescriptions   NAPROXEN (NAPROSYN) 250 MG TABLET    Take 1 tablet (250 mg total) by mouth 2 (two) times daily with a meal.    Final diagnoses:  Pain of left lower extremity  Pain of right lower extremity    This is a 30 y.o. male who presents to the ED complaining of bilateral leg pain for greater than one month. The patient reports he has been having trouble with bilateral leg pain for over one month and has been followed by his PCP. The patient  reports he has pain that starts in his right leg that radiates up the resentment into his left leg. He reports his primary care provider provided him with Tylenol No. 3 which he has been taking intermittently. He reports this does not help with his pain. He has taken nothing for treatment of his symptoms today. He complains of 10 out of 10 leg pain today. No leg swelling. No injury or trauma. He reports intermittent tingling but no numbness or weakness. He denies any back pain. 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 No history of cancer or IV drug use.  Patient had an unremarkable MRI of his lumbar spine approximately one month ago. No fevers. No trauma. On exam the patient is afebrile nontoxic appearing. He has no focal neurological deficits. He is able to ambulate with his single crutch with no foot drop. No midline neck or back tenderness. No rashes.  Patient with chronic ongoing leg pain. Patient had unremarkable MRI of his lumbar spine approximately one month ago. He is followed by his primary care provider. He has been taking nothing for treatment of his symptoms. Will provide with Vicodin in the emergency department and discharge her perceptions were naproxen and have him follow-up closely with primary care for further evaluation. I see no need for emergent imaging today. I advised the patient to follow-up with their primary care provider this week. I advised the patient to return to the emergency department with new or worsening symptoms or new concerns. The patient verbalized understanding and agreement with plan.    This patient was discussed with Dr. Rubin Payor who agrees with assessment and plan.   Everlene Farrier, PA-C 10/10/15 1610  Benjiman Core, MD 10/10/15 [phone]

## 2015-10-13 ENCOUNTER — Encounter (HOSPITAL_COMMUNITY): Payer: Self-pay | Admitting: Emergency Medicine

## 2015-10-13 ENCOUNTER — Emergency Department (HOSPITAL_COMMUNITY)
Admission: EM | Admit: 2015-10-13 | Discharge: 2015-10-13 | Disposition: A | Discharge status: Home / Self Care | Payer: No Typology Code available for payment source | Attending: Emergency Medicine | Admitting: Emergency Medicine

## 2015-10-13 DIAGNOSIS — K5900 Constipation, unspecified: Secondary | ICD-10-CM | POA: Insufficient documentation

## 2015-10-13 DIAGNOSIS — Z79899 Other long term (current) drug therapy: Secondary | ICD-10-CM | POA: Insufficient documentation

## 2015-10-13 DIAGNOSIS — H9203 Otalgia, bilateral: Secondary | ICD-10-CM | POA: Insufficient documentation

## 2015-10-13 DIAGNOSIS — F319 Bipolar disorder, unspecified: Secondary | ICD-10-CM | POA: Insufficient documentation

## 2015-10-13 DIAGNOSIS — R109 Unspecified abdominal pain: Secondary | ICD-10-CM

## 2015-10-13 DIAGNOSIS — Z8679 Personal history of other diseases of the circulatory system: Secondary | ICD-10-CM | POA: Insufficient documentation

## 2015-10-13 DIAGNOSIS — F410 Panic disorder [episodic paroxysmal anxiety] without agoraphobia: Secondary | ICD-10-CM | POA: Insufficient documentation

## 2015-10-13 DIAGNOSIS — Z791 Long term (current) use of non-steroidal anti-inflammatories (NSAID): Secondary | ICD-10-CM | POA: Insufficient documentation

## 2015-10-13 DIAGNOSIS — R112 Nausea with vomiting, unspecified: Secondary | ICD-10-CM | POA: Insufficient documentation

## 2015-10-13 DIAGNOSIS — R1084 Generalized abdominal pain: Secondary | ICD-10-CM | POA: Insufficient documentation

## 2015-10-13 DIAGNOSIS — G8929 Other chronic pain: Secondary | ICD-10-CM | POA: Insufficient documentation

## 2015-10-13 LAB — COMPREHENSIVE METABOLIC PANEL
ALK PHOS: 62 U/L (ref 38–126)
ALT: 9 U/L — ABNORMAL LOW (ref 17–63)
AST: 13 U/L — AB (ref 15–41)
Albumin: 4.1 g/dL (ref 3.5–5.0)
Anion gap: 8 (ref 5–15)
BILIRUBIN TOTAL: 0.3 mg/dL (ref 0.3–1.2)
BUN: 20 mg/dL (ref 6–20)
CALCIUM: 8.8 mg/dL — AB (ref 8.9–10.3)
CO2: 25 mmol/L (ref 22–32)
CREATININE: 0.77 mg/dL (ref 0.61–1.24)
Chloride: 108 mmol/L (ref 101–111)
GFR calc Af Amer: >60 mL/min (ref >60)
Glucose, Bld: 88 mg/dL (ref 65–99)
POTASSIUM: 3.9 mmol/L (ref 3.5–5.1)
Sodium: 141 mmol/L (ref 135–145)
TOTAL PROTEIN: 7.3 g/dL (ref 6.5–8.1)

## 2015-10-13 LAB — CBC WITH DIFFERENTIAL/PLATELET
BASOS ABS: 0 10*3/uL (ref 0.0–0.1)
BASOS PCT: 0 %
EOS ABS: 0.2 10*3/uL (ref 0.0–0.7)
EOS PCT: 2 %
HCT: 35 % — ABNORMAL LOW (ref 39.0–52.0)
Hemoglobin: 11.2 g/dL — ABNORMAL LOW (ref 13.0–17.0)
Lymphocytes Relative: 22 %
Lymphs Abs: 1.8 10*3/uL (ref 0.7–4.0)
MCH: 24.2 pg — ABNORMAL LOW (ref 26.0–34.0)
MCHC: 32 g/dL (ref 30.0–36.0)
MCV: 75.8 fL — ABNORMAL LOW (ref 78.0–100.0)
MONO ABS: 0.5 10*3/uL (ref 0.1–1.0)
Monocytes Relative: 5 %
NEUTROS ABS: 6 10*3/uL (ref 1.7–7.7)
Neutrophils Relative %: 71 %
PLATELETS: 387 10*3/uL (ref 150–400)
RBC: 4.62 MIL/uL (ref 4.22–5.81)
RDW: 16.9 % — AB (ref 11.5–15.5)
WBC: 8.5 10*3/uL (ref 4.0–10.5)

## 2015-10-13 LAB — LIPASE, BLOOD: Lipase: 16 U/L (ref 11–51)

## 2015-10-13 MED ORDER — ONDANSETRON 4 MG PO TBDP
4.0000 mg | ORAL_TABLET | Freq: Once | ORAL | Status: AC
  Administered 2015-10-13: 4 mg via ORAL
  Filled 2015-10-13: qty 1

## 2015-10-13 NOTE — ED Provider Notes (Signed)
CSN: [id number]     Arrival date & time 10/13/15  0813 History   First MD Initiated Contact with Patient 10/13/15 [phone]     Chief Complaint  Patient presents with  . Emesis  . Otalgia     (Consider location/radiation/quality/duration/timing/severity/associated sxs/prior Treatment) HPI Comments: 30yo M w/ PMH including chronic abdominal pain, bipolar d/o who p/w R ear pain and vomiting. The patient reports a five-day history of right earache. He does endorse associated scratchy throat, while the intermittent cough, and runny nose which he thought was related to allergies. He denies any associated fevers. He has taken Benadryl without relief. The patient also endorses 2 days of intermittent vomiting. He has chronic abdominal pain which is usually on his right side but has been having some abdominal pain on his left side. No change in bowel movements and no blood in his stool. No urinary symptoms. No sick contacts.  Patient is a 30 y.o. male presenting with vomiting and ear pain. The history is provided by the patient.  Emesis Otalgia Associated symptoms: vomiting     Past Medical History  Diagnosis Date  . Medical history non-contributory   . Anxiety   . Panic attack as reaction to stress   . Anemia   . Depression   . Gallstones   . Dysrhythmia     irregular heart rate - due to zoloft  . Bipolar disorder (HCC)   . Cirrhosis (HCC)   . Constipation    Past Surgical History  Procedure Laterality Date  . Cholecystectomy    . Tonsillectomy    . Incise and drain abcess    . Mouth surgery    . Esophagogastroduodenoscopy N/A 05/22/2015    Procedure: ESOPHAGOGASTRODUODENOSCOPY (EGD);  Surgeon: Vida RiggerMarc Magod, MD;  [facility];  Service: Endoscopy;  Laterality: N/A;   Family History  Problem Relation Age of Onset  . Irritable bowel syndrome Mother   . Kidney disease Mother   . Ulcerative colitis Maternal Grandfather   . Colon cancer Neg Hx   . Colon polyps Neg Hx   .  Gallbladder disease Neg Hx   . Esophageal cancer Neg Hx   . Diabetes Neg Hx    Social History  Substance Use Topics  . Smoking status: Never Smoker   . Smokeless tobacco: Never Used  . Alcohol Use: No    Review of Systems  HENT: Positive for ear pain.   Gastrointestinal: Positive for vomiting.   10 Systems reviewed and are negative for acute change except as noted in the HPI.    Allergies  Reglan; Toradol; Tramadol; and Zoloft  Home Medications   Prior to Admission medications   Medication Sig Start Date End Date Taking? Authorizing Provider  clonazePAM (KLONOPIN) 1 MG tablet Take 1 tablet (1 mg total) by mouth 2 (two) times daily. Fill on 10/01/15 09/20/15  Yes Josalyn Funches, MD  diphenhydrAMINE (BENADRYL) 25 mg capsule Take 50 mg by mouth at bedtime as needed for sleep.   Yes Historical Provider, MD  ibuprofen (ADVIL,MOTRIN) 200 MG tablet Take 400-800 mg by mouth every 6 (six) hours as needed for moderate pain.    Yes Historical Provider, MD  LINZESS 290 MCG CAPS capsule Take 290 mcg by mouth daily. 07/16/15  Yes Historical Provider, MD  naproxen (NAPROSYN) 250 MG tablet Take 1 tablet (250 mg total) by mouth 2 (two) times daily with a meal. 10/10/15  Yes Everlene FarrierWilliam Dansie, PA-C  promethazine (PHENERGAN) 25 MG tablet Take 1 tablet (25 mg  total) by mouth every 8 (eight) hours as needed for nausea or vomiting. 09/17/15  Yes Josalyn Funches, MD  gabapentin (NEURONTIN) 100 MG capsule Take 1 capsule (100 mg total) by mouth 3 (three) times daily. Patient not taking: Reported on 10/10/2015 09/20/15   Dessa Phi, MD  pregabalin (LYRICA) 50 MG capsule Take 1 capsule (50 mg total) by mouth 3 (three) times daily. 09/20/15   Josalyn Funches, MD  Vitamin D, Ergocalciferol, (DRISDOL) 50000 units CAPS capsule Take 1 capsule (50,000 Units total) by mouth every 7 (seven) days. For 8 weeks Patient not taking: Reported on 10/10/2015 09/20/15   Josalyn Funches, MD   BP 121/83 mmHg  Pulse 89  Temp(Src)  97.7 F (36.5 C) (Oral)  Resp 16  SpO2 100% Physical Exam  Constitutional: He is oriented to person, place, and time. He appears well-developed and well-nourished. No distress.  HENT:  Head: Normocephalic and atraumatic.  Right Ear: Tympanic membrane and ear canal normal.  Left Ear: Tympanic membrane and ear canal normal.  Mouth/Throat: Oropharynx is clear and moist. No oropharyngeal exudate.  Moist mucous membranes  Eyes: Conjunctivae are normal. Pupils are equal, round, and reactive to light.  Neck: Neck supple.  Cardiovascular: Normal rate, regular rhythm and normal heart sounds.   No murmur heard. Pulmonary/Chest: Effort normal and breath sounds normal.  Abdominal: Soft. Bowel sounds are normal. He exhibits no distension. There is tenderness. There is no rebound and no guarding.  Mild generalized TTP  Musculoskeletal: He exhibits no edema.  Neurological: He is alert and oriented to person, place, and time.  Fluent speech  Skin: Skin is warm and dry.  Psychiatric: He has a normal mood and affect. Judgment normal.  disheveled  Nursing note and vitals reviewed.   ED Course  Procedures (including critical care time) Labs Review Labs Reviewed  COMPREHENSIVE METABOLIC PANEL - Abnormal; Notable for the following:    Calcium 8.8 (*)    AST 13 (*)    ALT 9 (*)    All other components within normal limits  CBC WITH DIFFERENTIAL/PLATELET - Abnormal; Notable for the following:    Hemoglobin 11.2 (*)    HCT 35.0 (*)    MCV 75.8 (*)    MCH 24.2 (*)    RDW 16.9 (*)    All other components within normal limits  LIPASE, BLOOD    Imaging Review No results found. I have personally reviewed and evaluated these lab results as part of my medical decision-making. Medications  ondansetron (ZOFRAN-ODT) disintegrating tablet 4 mg (4 mg Oral Given 10/13/15 0908)     MDM   Final diagnoses:  Otalgia, bilateral  Chronic abdominal pain  Non-intractable vomiting with nausea, vomiting of  unspecified type    Pt p/w 5 days of R otalgia w/ runny nose, scratchy throat as well as 2 days of intermittent vomiting and occasional L sided abd pain. He was awake and alert, comfortable on exam. Vital signs unremarkable. Ear exam was normal. No oropharyngeal erythema or exudates. He had mild generalized abdominal tenderness without rebound or guarding and was distractible on exam. Regarding ear pain, I suspect referred pain and possible eustachian tube dysfunction related to seasonal allergies. I have instructed on over-the-counter medications including Sudafed for symptom relief as well as starting an location such as Zyrtec or Claritin daily.  Regarding abdominal pain, obtained above lab work to rule out acute process. Gave this patient Zofran ODT. Pt able to tolerate zofran and water without vomiting.  Labwork is consistent  with the patient's previous labs and no acute findings to suggest acute intra-abdominal process. On reexamination, the patient was sleeping. I discussed supportive care instructions and instructed to follow-up with gastroenterologist regarding his chronic abdominal pain. Patient voiced understanding and was discharged in satisfactory condition.  Laurence Spates, MD 10/13/15 [phone]

## 2015-10-13 NOTE — ED Notes (Signed)
Pt reports R earache for the past 5 days associated with dizziness and eye pain. Pt also reports emesis for the past 2 days. No diarrhea

## 2015-10-13 NOTE — Discharge Instructions (Signed)
1. You may take a decongestant such as sudafed for 2-3 days to help relieve congestion and ear pain. 2. You may start taking a daily allergy medication such as claritin or zyrtec.

## 2015-10-16 ENCOUNTER — Ambulatory Visit: Payer: Self-pay | Attending: Family Medicine | Admitting: Family Medicine

## 2015-10-16 ENCOUNTER — Encounter: Payer: Self-pay | Admitting: Family Medicine

## 2015-10-16 VITALS — BP 99/60 | HR 94 | Temp 98.5°F | Resp 16 | Ht 67.0 in | Wt 129.0 lb

## 2015-10-16 DIAGNOSIS — R45851 Suicidal ideations: Secondary | ICD-10-CM | POA: Insufficient documentation

## 2015-10-16 DIAGNOSIS — F419 Anxiety disorder, unspecified: Secondary | ICD-10-CM | POA: Insufficient documentation

## 2015-10-16 DIAGNOSIS — F329 Major depressive disorder, single episode, unspecified: Secondary | ICD-10-CM | POA: Insufficient documentation

## 2015-10-16 DIAGNOSIS — K589 Irritable bowel syndrome without diarrhea: Secondary | ICD-10-CM | POA: Insufficient documentation

## 2015-10-16 DIAGNOSIS — R202 Paresthesia of skin: Secondary | ICD-10-CM

## 2015-10-16 DIAGNOSIS — R208 Other disturbances of skin sensation: Secondary | ICD-10-CM | POA: Insufficient documentation

## 2015-10-16 DIAGNOSIS — G8929 Other chronic pain: Secondary | ICD-10-CM

## 2015-10-16 DIAGNOSIS — R109 Unspecified abdominal pain: Secondary | ICD-10-CM | POA: Insufficient documentation

## 2015-10-16 DIAGNOSIS — M25562 Pain in left knee: Secondary | ICD-10-CM

## 2015-10-16 DIAGNOSIS — Z79899 Other long term (current) drug therapy: Secondary | ICD-10-CM | POA: Insufficient documentation

## 2015-10-16 MED ORDER — PROMETHAZINE HCL 25 MG PO TABS: 25.0000 mg | ORAL_TABLET | Freq: Three times a day (TID) | ORAL | Status: DC | PRN

## 2015-10-16 MED ORDER — ACETAMINOPHEN-CODEINE #3 300-30 MG PO TABS: 1.0000 | ORAL_TABLET | Freq: Four times a day (QID) | ORAL | Status: DC | PRN

## 2015-10-16 MED ORDER — METHYLPREDNISOLONE ACETATE 40 MG/ML IJ SUSP
40.0000 mg | Freq: Once | INTRAMUSCULAR | Status: AC
  Administered 2015-10-16: 40 mg via INTRA_ARTICULAR

## 2015-10-16 MED FILL — VIT D2 1.25 MG (50,000 UNIT: 1.25 MG | 56 days supply | Qty: 8 | Fill #0

## 2015-10-16 MED FILL — ACETAMINOPHEN/COD #3 TABLET: 300-30 | 30 days supply | Qty: 120 | Fill #0

## 2015-10-16 MED FILL — GABAPENTIN 100 MG CAPSULE: 100 | 33 days supply | Qty: 98 | Fill #0

## 2015-10-16 NOTE — Progress Notes (Signed)
F/U legs and knee pain  Pain scale #10  No tobacco user  No suicidal thoughts in the past two weeks

## 2015-10-16 NOTE — Patient Instructions (Addendum)
[facility] Palmer was seen today for leg pain.  Diagnoses and all orders for this visit:  Left knee pain -     methylPREDNISolone acetate (DEPO-MEDROL) injection 40 mg; Inject 1 mL (40 mg total) into the articular space once. -     MR Knee Left W Wo Contrast; Future -     acetaminophen-codeine (TYLENOL #3) 300-30 MG tablet; Take 1 tablet by mouth every 6 (six) hours as needed for moderate pain.  Paresthesia of right leg -     Ambulatory referral to Neurology -     acetaminophen-codeine (TYLENOL #3) 300-30 MG tablet; Take 1 tablet by mouth every 6 (six) hours as needed for moderate pain.  Chronic abdominal pain -     promethazine (PHENERGAN) 25 MG tablet; Take 1 tablet (25 mg total) by mouth every 8 (eight) hours as needed for nausea or vomiting. -     acetaminophen-codeine (TYLENOL #3) 300-30 MG tablet; Take 1 tablet by mouth every 6 (six) hours as needed for moderate pain.  You have received a shot of steroid in your joint today. Rest and ice knee today. Regular activity tomorrow. Look out for redness, swelling, fever,severe pain in joint and call if you experience these symptoms.  Please seek mental health treatment at [facility], [facility] or [facility].   F/u in 6 weeks   Dr. Armen PickupFunches

## 2015-10-16 NOTE — Progress Notes (Signed)
Patient ID: Levi Palmer, male   DOB: Apr 03, 1986, 30 y.o.   MRN: [id number]   Subjective:  Patient ID: Levi Palmer, male    DOB: 04-Jan-1986  Age: 30 y.o. MRN: [id number]  CC: Leg Pain   HPI Levi Palmer has anxiety, depression, chronic abdominal pain, ? IBS, presents for    1. R flank pain: pain down to rib to leg. Pain is R sided only. Burning sensation. Does not cross midline. No rash. No trauma. Tylenol #3 and klonopin do not help with pain. Went to ED on 09/17/15. Had normal MRI.  Went to UC on 09/18/14, no treatment. No fever or chills. Gabapentin has not helped with his pain so he stopped taking it. He has not yet received lyrica.   2. Anxiety and depression: klonopin helps with anxiety and abdominal pain. He has not yet gone to [facility] for treatment of depression. He admits to suicidal thoughts with no plan.   3. L knee pain: this is chronic. Worsening. Superior and medial knee pain. He responded well to steroid injection in the past and request one today.   Previous imaging: 07/03/15 CLINICAL DATA: Diffuse knee pain. No known injury. Unable to bear weight. Initial evaluation .  EXAM: LEFT KNEE - COMPLETE 4+ VIEW  COMPARISON: None.  FINDINGS: Questionable Set cortical irregularity with mild periosteal reaction is noted along the posterior aspect of the distal left femoral metaphysis. MRI of the left knee is suggested for further evaluation. No evidence of fracture or dislocation. No evidence of effusion .  IMPRESSION: Questionable subtle cortical irregularity with mild periosteal reaction is noted along the posterior aspect of the distal left femoral metaphysis. MRI of the left knee is suggested for further evaluation.   Electronically Signed  By: Maisie Fus Register  On: 07/03/2015 08:35  He request phenergan and tylenol #3 refill   Social History  Substance Use Topics  . Smoking status: Never Smoker   . Smokeless tobacco: Never  Used  . Alcohol Use: No    Outpatient Prescriptions Prior to Visit  Medication Sig Dispense Refill  . clonazePAM (KLONOPIN) 1 MG tablet Take 1 tablet (1 mg total) by mouth 2 (two) times daily. Fill on 10/01/15 60 tablet 0  . diphenhydrAMINE (BENADRYL) 25 mg capsule Take 50 mg by mouth at bedtime as needed for sleep.    Marland Kitchen ibuprofen (ADVIL,MOTRIN) 200 MG tablet Take 400-800 mg by mouth every 6 (six) hours as needed for moderate pain.     Marland Kitchen LINZESS 290 MCG CAPS capsule Take 290 mcg by mouth daily.  2  . naproxen (NAPROSYN) 250 MG tablet Take 1 tablet (250 mg total) by mouth 2 (two) times daily with a meal. 30 tablet 0  . promethazine (PHENERGAN) 25 MG tablet Take 1 tablet (25 mg total) by mouth every 8 (eight) hours as needed for nausea or vomiting. 60 tablet 2  . Vitamin D, Ergocalciferol, (DRISDOL) 50000 units CAPS capsule Take 1 capsule (50,000 Units total) by mouth every 7 (seven) days. For 8 weeks 8 capsule 0  . pregabalin (LYRICA) 50 MG capsule Take 1 capsule (50 mg total) by mouth 3 (three) times daily. (Patient not taking: Reported on 10/16/2015) 90 capsule 0  . gabapentin (NEURONTIN) 100 MG capsule Take 1 capsule (100 mg total) by mouth 3 (three) times daily. (Patient not taking: Reported on 10/16/2015) 90 capsule 3   No facility-administered medications prior to visit.    ROS Review of Systems  Constitutional: Negative for fever,  chills, fatigue and unexpected weight change.  Eyes: Negative for visual disturbance.  Respiratory: Negative for cough and shortness of breath.   Cardiovascular: Negative for chest pain, palpitations and leg swelling.  Gastrointestinal: Negative for nausea, vomiting, diarrhea, constipation and blood in stool.  Endocrine: Negative for polydipsia, polyphagia and polyuria.  Genitourinary: Negative for dysuria, urgency, frequency, hematuria, flank pain (R sided ), decreased urine volume, discharge, penile swelling, scrotal swelling, genital sores, penile pain and  testicular pain.  Musculoskeletal: Positive for myalgias and arthralgias. Negative for back pain, gait problem and neck pain.  Skin: Negative for rash.  Allergic/Immunologic: Negative for immunocompromised state.  Hematological: Negative for adenopathy. Does not bruise/bleed easily.  Psychiatric/Behavioral: Positive for dysphoric mood. Negative for suicidal ideas and sleep disturbance. The patient is nervous/anxious.     Objective:  BP 99/60 mmHg  Pulse 94  Temp(Src) 98.5 F (36.9 C) (Oral)  Resp 16  Ht 5\' 7"  (1.702 m)  Wt 129 lb (58.514 kg)  BMI 20.20 kg/m2  SpO2 100%  BP/Weight 10/16/2015 10/13/2015 10/10/2015  Systolic BP 99 126 129  Diastolic BP 60 87 75  Wt. (Lbs) 129 - -  BMI 20.2 - -   Wt Readings from Last 3 Encounters:  10/16/15 129 lb (58.514 kg)  09/20/15 134 lb (60.782 kg)  09/17/15 130 lb (58.968 kg)   Physical Exam  Constitutional: He appears well-developed and well-nourished. No distress.  HENT:  Head: Normocephalic and atraumatic.  Mouth/Throat: No oral lesions. Abnormal dentition. Dental caries present. No dental abscesses.  Multiple broken teeth   Neck: Normal range of motion. Neck supple.  Cardiovascular: Normal rate, regular rhythm, normal heart sounds and intact distal pulses.   Pulmonary/Chest: Effort normal and breath sounds normal.  Abdominal: Soft. Bowel sounds are normal. He exhibits no distension and no mass. There is no hepatosplenomegaly, splenomegaly or hepatomegaly. There is no tenderness. There is no rebound, no guarding and no CVA tenderness. No hernia. Hernia confirmed negative in the ventral area, confirmed negative in the right inguinal area and confirmed negative in the left inguinal area.  Genitourinary: Testes normal and penis normal. Right testis shows no mass and no swelling. Left testis shows no mass and no swelling. Uncircumcised.  Musculoskeletal: He exhibits no edema.       Right hip: Normal.       Left knee: He exhibits abnormal  patellar mobility. He exhibits normal range of motion, no swelling, no effusion, no ecchymosis, no deformity, no laceration, no erythema, normal alignment and no LCL laxity. Tenderness found. Medial joint line tenderness noted. No lateral joint line, no MCL, no LCL and no patellar tendon tenderness noted.       Legs: Neurological: He is alert.  Skin: Skin is warm and dry. No rash noted. No erythema.  No rash or erythema on R side   Psychiatric: He exhibits a depressed mood.    After obtaining informed consent and cleaning the skin using iodine and alcohol a  steroid injection was performed at L knee using 1% plain Lidocaine and 40 mg of depo medrol . This was well tolerated  Assessment & Plan:   Levi NeedleMichael was seen today for leg pain.  Diagnoses and all orders for this visit:  Left knee pain -     methylPREDNISolone acetate (DEPO-MEDROL) injection 40 mg; Inject 1 mL (40 mg total) into the articular space once. -     MR Knee Left W Wo Contrast; Future -     acetaminophen-codeine (TYLENOL #3) 300-30  MG tablet; Take 1 tablet by mouth every 6 (six) hours as needed for moderate pain.  Paresthesia of right leg -     Ambulatory referral to Neurology -     acetaminophen-codeine (TYLENOL #3) 300-30 MG tablet; Take 1 tablet by mouth every 6 (six) hours as needed for moderate pain.  Chronic abdominal pain -     promethazine (PHENERGAN) 25 MG tablet; Take 1 tablet (25 mg total) by mouth every 8 (eight) hours as needed for nausea or vomiting. -     acetaminophen-codeine (TYLENOL #3) 300-30 MG tablet; Take 1 tablet by mouth every 6 (six) hours as needed for moderate pain.   Follow-up: No Follow-up on file.   Dessa Phi MD

## 2015-10-17 ENCOUNTER — Encounter: Payer: Self-pay | Admitting: Clinical

## 2015-10-17 NOTE — Assessment & Plan Note (Signed)
Persistent paresthesias with no improvement with gabapentin Neuro referral placed

## 2015-10-17 NOTE — Assessment & Plan Note (Signed)
Worsening L knee pain with abnormal x-ray  sterioid injection F/u MRI

## 2015-10-17 NOTE — Progress Notes (Signed)
[facility] 10/16/2015 09/20/2015 09/03/2015 09/03/2015 08/06/2015  Decreased Interest 3 3 2 2 3   Down, Depressed, Hopeless 3 3 3 3 3   PHQ - 2 Score 6 6 5 5 6   Altered sleeping 2 2 1 1 3   Tired, decreased energy 3 3 3 3 3   Change in appetite 3 3 3 3 3   Feeling bad or failure about yourself  3 3 3 3 3   Trouble concentrating 3 3 2 2 3   Moving slowly or fidgety/restless 3 3 3 3 3   Suicidal thoughts 3 3 0 0 3  PHQ-9 Score 26 26 20 20 27     GAD 7 : Generalized Anxiety Score 10/16/2015 09/20/2015 09/03/2015 09/03/2015  Nervous, Anxious, on Edge 3 3 3 3   Control/stop worrying 3 3 3 3   Worry too much - different things 3 3 2 2   Trouble relaxing 3 3 3 3   Restless 1 3 0 0  Easily annoyed or irritable 3 3 1 1   Afraid - awful might happen 3 2 2 2   Total GAD 7 Score 19 20 14 14   Anxiety Difficulty - - - -   *Per patient, "I have 0 interest in anything. I am feeling more emotionally detached by the day. I'm having trouble recollecting things. I can't remember songs I've known for 20 years. Something is way off and I am extremely concerned".   * No plans to end life in last 2 weeks

## 2015-10-25 ENCOUNTER — Telehealth: Payer: Self-pay | Admitting: Family Medicine

## 2015-10-25 ENCOUNTER — Ambulatory Visit (HOSPITAL_COMMUNITY)
Admission: RE | Admit: 2015-10-25 | Discharge: 2015-10-25 | Disposition: A | Discharge status: Home / Self Care | Payer: Self-pay | Source: Ambulatory Visit | Attending: Family Medicine | Admitting: Family Medicine

## 2015-10-25 DIAGNOSIS — M25562 Pain in left knee: Secondary | ICD-10-CM | POA: Insufficient documentation

## 2015-10-25 DIAGNOSIS — M6752 Plica syndrome, left knee: Secondary | ICD-10-CM | POA: Insufficient documentation

## 2015-10-25 DIAGNOSIS — K209 Esophagitis, unspecified without bleeding: Secondary | ICD-10-CM

## 2015-10-25 DIAGNOSIS — M7122 Synovial cyst of popliteal space [Baker], left knee: Secondary | ICD-10-CM | POA: Insufficient documentation

## 2015-10-25 DIAGNOSIS — R600 Localized edema: Secondary | ICD-10-CM | POA: Insufficient documentation

## 2015-10-25 MED ORDER — GADOBENATE DIMEGLUMINE 529 MG/ML IV SOLN
13.0000 mL | Freq: Once | INTRAVENOUS | Status: AC | PRN
  Administered 2015-10-25: 13 mL via INTRAVENOUS

## 2015-10-25 MED FILL — PROMETHAZINE 25 MG TABLET: 25 | 20 days supply | Qty: 60 | Fill #0

## 2015-10-25 NOTE — Telephone Encounter (Signed)
Pt. Came in stating that he feels like the infection that he had in his throat has came back. He stated that is hurst to swallow and his chest kind of hurts. Pt. Is afraid of going to the ED because they do not help him. He would like advise to see what he can do. Please f/u with pt.

## 2015-10-26 DIAGNOSIS — K209 Esophagitis, unspecified without bleeding: Secondary | ICD-10-CM | POA: Insufficient documentation

## 2015-10-26 MED ORDER — CLOTRIMAZOLE 10 MG MT TROC: 10.0000 mg | Freq: Every day | OROMUCOSAL | Status: DC

## 2015-10-26 MED FILL — CLOTRIMAZOLE 10MG TROCHE rx: 10 | 7 days supply | Qty: 35 | Fill #0

## 2015-10-26 NOTE — Telephone Encounter (Signed)
Patient returned nurse phone call. Patient was given note from doctor. Patient understood. Patient feels that he was not diagnosed and is wanting to advice from the doctor, Please follow up.

## 2015-10-26 NOTE — Telephone Encounter (Signed)
Please call back to patient My advice is  1. Make f/u appt for exam,  but be mindful that I will be able to see oropharynx on physical exam. I can treat pain in office with GI cocktail to help better isolate source of pain for esophagus vs heart or lungs  2. Take clotrimazole 3. If pain does not improve with clotrimazole, there is a good chance he will need another EGD

## 2015-10-26 NOTE — Telephone Encounter (Signed)
Called patient Left VM requesting a call back.  When patient calls back please give the following message  Of note, patient was dx with candida esophagitis in 05/2015.  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

## 2015-10-26 NOTE — Telephone Encounter (Signed)
Pt. Came into facility requesting a letter from his PCP. Pt. Would like the letter so that he can take to the ED so they can check his throat because it hurts to swallow. Please f/u with pt.

## 2015-10-30 ENCOUNTER — Other Ambulatory Visit: Payer: Self-pay | Admitting: Family Medicine

## 2015-10-30 ENCOUNTER — Encounter: Payer: Self-pay | Admitting: Family Medicine

## 2015-10-30 ENCOUNTER — Telehealth: Payer: Self-pay | Admitting: Family Medicine

## 2015-10-30 DIAGNOSIS — M25562 Pain in left knee: Secondary | ICD-10-CM

## 2015-10-30 DIAGNOSIS — F419 Anxiety disorder, unspecified: Secondary | ICD-10-CM

## 2015-10-30 NOTE — Telephone Encounter (Signed)
Patient called requesting Klonopin. Please follow up.

## 2015-10-31 ENCOUNTER — Emergency Department (HOSPITAL_COMMUNITY)
Admission: EM | Admit: 2015-10-31 | Discharge: 2015-10-31 | Disposition: A | Discharge status: Home / Self Care | Payer: Self-pay | Attending: Emergency Medicine | Admitting: Emergency Medicine

## 2015-10-31 ENCOUNTER — Emergency Department (HOSPITAL_COMMUNITY): Payer: Self-pay

## 2015-10-31 ENCOUNTER — Ambulatory Visit: Payer: Self-pay | Admitting: Neurology

## 2015-10-31 ENCOUNTER — Encounter (HOSPITAL_COMMUNITY): Payer: Self-pay | Admitting: Emergency Medicine

## 2015-10-31 ENCOUNTER — Telehealth: Payer: Self-pay | Admitting: *Deleted

## 2015-10-31 DIAGNOSIS — R1084 Generalized abdominal pain: Secondary | ICD-10-CM | POA: Insufficient documentation

## 2015-10-31 DIAGNOSIS — F319 Bipolar disorder, unspecified: Secondary | ICD-10-CM | POA: Insufficient documentation

## 2015-10-31 DIAGNOSIS — F419 Anxiety disorder, unspecified: Secondary | ICD-10-CM | POA: Insufficient documentation

## 2015-10-31 DIAGNOSIS — Z791 Long term (current) use of non-steroidal anti-inflammatories (NSAID): Secondary | ICD-10-CM | POA: Insufficient documentation

## 2015-10-31 DIAGNOSIS — Z862 Personal history of diseases of the blood and blood-forming organs and certain disorders involving the immune mechanism: Secondary | ICD-10-CM | POA: Insufficient documentation

## 2015-10-31 DIAGNOSIS — K5900 Constipation, unspecified: Secondary | ICD-10-CM | POA: Insufficient documentation

## 2015-10-31 DIAGNOSIS — Z8679 Personal history of other diseases of the circulatory system: Secondary | ICD-10-CM | POA: Insufficient documentation

## 2015-10-31 DIAGNOSIS — K029 Dental caries, unspecified: Secondary | ICD-10-CM | POA: Insufficient documentation

## 2015-10-31 DIAGNOSIS — R070 Pain in throat: Secondary | ICD-10-CM | POA: Insufficient documentation

## 2015-10-31 DIAGNOSIS — R112 Nausea with vomiting, unspecified: Secondary | ICD-10-CM | POA: Insufficient documentation

## 2015-10-31 DIAGNOSIS — Z79899 Other long term (current) drug therapy: Secondary | ICD-10-CM | POA: Insufficient documentation

## 2015-10-31 LAB — COMPREHENSIVE METABOLIC PANEL
ALT: 11 U/L — AB (ref 17–63)
AST: 16 U/L (ref 15–41)
Albumin: 4.1 g/dL (ref 3.5–5.0)
Alkaline Phosphatase: 65 U/L (ref 38–126)
Anion gap: 10 (ref 5–15)
BILIRUBIN TOTAL: 0.6 mg/dL (ref 0.3–1.2)
BUN: 16 mg/dL (ref 6–20)
CHLORIDE: 104 mmol/L (ref 101–111)
CO2: 25 mmol/L (ref 22–32)
CREATININE: 0.88 mg/dL (ref 0.61–1.24)
Calcium: 9.6 mg/dL (ref 8.9–10.3)
Glucose, Bld: 106 mg/dL — ABNORMAL HIGH (ref 65–99)
POTASSIUM: 3.5 mmol/L (ref 3.5–5.1)
Sodium: 139 mmol/L (ref 135–145)
TOTAL PROTEIN: 7.5 g/dL (ref 6.5–8.1)

## 2015-10-31 LAB — URINE MICROSCOPIC-ADD ON

## 2015-10-31 LAB — CBC
HCT: 38 % — ABNORMAL LOW (ref 39.0–52.0)
Hemoglobin: 12.1 g/dL — ABNORMAL LOW (ref 13.0–17.0)
MCH: 24.1 pg — ABNORMAL LOW (ref 26.0–34.0)
MCHC: 31.8 g/dL (ref 30.0–36.0)
MCV: 75.7 fL — AB (ref 78.0–100.0)
PLATELETS: 307 10*3/uL (ref 150–400)
RBC: 5.02 MIL/uL (ref 4.22–5.81)
RDW: 17.2 % — AB (ref 11.5–15.5)
WBC: 9.2 10*3/uL (ref 4.0–10.5)

## 2015-10-31 LAB — URINALYSIS, ROUTINE W REFLEX MICROSCOPIC
GLUCOSE, UA: NEGATIVE mg/dL
Hgb urine dipstick: NEGATIVE
KETONES UR: 15 mg/dL — AB
LEUKOCYTES UA: NEGATIVE
NITRITE: NEGATIVE
PROTEIN: 30 mg/dL — AB
Specific Gravity, Urine: 1.031 — ABNORMAL HIGH (ref 1.005–1.030)
pH: 6 (ref 5.0–8.0)

## 2015-10-31 LAB — LIPASE, BLOOD: LIPASE: 20 U/L (ref 11–51)

## 2015-10-31 MED ORDER — PROMETHAZINE HCL 25 MG PO TABS
25.0000 mg | ORAL_TABLET | Freq: Once | ORAL | Status: AC
  Administered 2015-10-31: 25 mg via ORAL
  Filled 2015-10-31: qty 1

## 2015-10-31 MED ORDER — SODIUM CHLORIDE 0.9 % IV BOLUS (SEPSIS)
1000.0000 mL | Freq: Once | INTRAVENOUS | Status: AC
  Administered 2015-10-31: 1000 mL via INTRAVENOUS

## 2015-10-31 MED ORDER — HYDROMORPHONE HCL 1 MG/ML IJ SOLN
1.0000 mg | Freq: Once | INTRAMUSCULAR | Status: AC
  Administered 2015-10-31: 1 mg via INTRAVENOUS
  Filled 2015-10-31: qty 1

## 2015-10-31 MED ORDER — GI COCKTAIL ~~LOC~~
30.0000 mL | Freq: Once | ORAL | Status: AC
  Administered 2015-10-31: 30 mL via ORAL
  Filled 2015-10-31: qty 30

## 2015-10-31 NOTE — ED Provider Notes (Signed)
CSN: [id number]     Arrival date & time 10/31/15  4098 History   First MD Initiated Contact with Patient 10/31/15 [phone]     Chief Complaint  Patient presents with  . Sore Throat  . Emesis     (Consider location/radiation/quality/duration/timing/severity/associated sxs/prior Treatment) HPI   Patient is a 30 year old male with a past medical history of anemia, depression, anxiety, and bipolar disorder who presents the ED with 2 weeks of throat pain and 2 days of vomiting. He states the throat pain as constant, burning, 8/10 worse with swallowing or talking. He states it feels like he's being choked. He states this her pain is similar to throat pain he's had in the past when he was diagnosed with esophagitis 2/2 candidiasis. Patient states at baseline he has constant cramping abdominal pain with associated nausea and intermittent vomiting with bowel movements. He states the past 2 days, the pain has been more intense than normal and describes it as stabbing, constant and worse with vomiting. Associated vomiting. He takes Phenergan daily. It has not helped his nausea. He states his girlfriend has also been sick with vomiting for 2 days. He denies hematemesis, hematochezia, shortness of breath, chest pain.  Past Medical History  Diagnosis Date  . Medical history non-contributory   . Anxiety   . Panic attack as reaction to stress   . Anemia   . Depression   . Gallstones   . Dysrhythmia     irregular heart rate - due to zoloft  . Bipolar disorder (HCC)   . Cirrhosis (HCC)   . Constipation    Past Surgical History  Procedure Laterality Date  . Cholecystectomy    . Tonsillectomy    . Incise and drain abcess    . Mouth surgery    . Esophagogastroduodenoscopy N/A 05/22/2015    Procedure: ESOPHAGOGASTRODUODENOSCOPY (EGD);  Surgeon: Vida Rigger, MD;  Location: [facility] ENDOSCOPY;  Service: Endoscopy;  Laterality: N/A;   Family History  Problem Relation Age of Onset  . Irritable bowel syndrome  Mother   . Kidney disease Mother   . Ulcerative colitis Maternal Grandfather   . Colon cancer Neg Hx   . Colon polyps Neg Hx   . Gallbladder disease Neg Hx   . Esophageal cancer Neg Hx   . Diabetes Neg Hx    Social History  Substance Use Topics  . Smoking status: Never Smoker   . Smokeless tobacco: Never Used  . Alcohol Use: No    Review of Systems  Constitutional: Positive for chills. Negative for fever.  HENT: Positive for sore throat. Negative for congestion, mouth sores, sinus pressure and trouble swallowing.   Respiratory: Negative for cough, chest tightness and shortness of breath.   Cardiovascular: Negative for chest pain and leg swelling.  Gastrointestinal: Positive for nausea, vomiting and abdominal pain. Negative for diarrhea, blood in stool and abdominal distention.  Musculoskeletal: Negative for myalgias, back pain and arthralgias.  Skin: Negative for rash.  Allergic/Immunologic: Negative for immunocompromised state.  Neurological: Negative for dizziness, syncope and headaches.  Psychiatric/Behavioral: Negative for confusion.      Allergies  Reglan; Toradol; Tramadol; and Zoloft  Home Medications   Prior to Admission medications   Medication Sig Start Date End Date Taking? Authorizing Provider  acetaminophen-codeine (TYLENOL #3) 300-30 MG tablet Take 1 tablet by mouth every 6 (six) hours as needed for moderate pain. 10/16/15  Yes Josalyn Funches, MD  calcium carbonate (OS-CAL) 1250 (500 Ca) MG chewable tablet Chew 1 tablet by mouth  daily.   Yes Historical Provider, MD  clonazePAM (KLONOPIN) 1 MG tablet Take 1 tablet (1 mg total) by mouth 2 (two) times daily. Fill on 10/01/15 09/20/15  Yes Josalyn Funches, MD  diphenhydrAMINE (BENADRYL) 25 mg capsule Take 50 mg by mouth at bedtime as needed for sleep.   Yes Historical Provider, MD  ibuprofen (ADVIL,MOTRIN) 200 MG tablet Take 400-800 mg by mouth every 6 (six) hours as needed for moderate pain.    Yes Historical  Provider, MD  LINZESS 290 MCG CAPS capsule Take 290 mcg by mouth daily. 07/16/15  Yes Historical Provider, MD  naproxen (NAPROSYN) 250 MG tablet Take 1 tablet (250 mg total) by mouth 2 (two) times daily with a meal. 10/10/15  Yes Everlene FarrierWilliam Dansie, PA-C  promethazine (PHENERGAN) 25 MG tablet Take 1 tablet (25 mg total) by mouth every 8 (eight) hours as needed for nausea or vomiting. 10/16/15  Yes Dessa PhiJosalyn Funches, MD  Vitamin D, Ergocalciferol, (DRISDOL) 50000 units CAPS capsule Take 1 capsule (50,000 Units total) by mouth every 7 (seven) days. For 8 weeks 09/20/15  Yes Dessa PhiJosalyn Funches, MD  clotrimazole (MYCELEX) 10 MG troche Take 1 tablet (10 mg total) by mouth 5 (five) times daily. 10/26/15   Josalyn Funches, MD  pregabalin (LYRICA) 50 MG capsule Take 1 capsule (50 mg total) by mouth 3 (three) times daily. Patient not taking: Reported on 10/16/2015 09/20/15   Dessa PhiJosalyn Funches, MD   BP 119/77 mmHg  Pulse 121  Temp(Src) 98.2 F (36.8 C) (Oral)  Resp 18  Ht 5\' 7"  (1.702 m)  Wt 55.14 kg  BMI 19.03 kg/m2  SpO2 97% Physical Exam  Constitutional: He appears well-developed and well-nourished. No distress.  HENT:  Head: Normocephalic and atraumatic.  Mouth/Throat: Uvula is midline, oropharynx is clear and moist and mucous membranes are normal. No oral lesions. No trismus in the jaw. Abnormal dentition. Dental caries present. No uvula swelling.  Poor dentition  Eyes: Conjunctivae are normal.  Neck: Normal range of motion. Neck supple. No tracheal tenderness present. No tracheal deviation, no edema and no erythema present.  Cardiovascular: Normal rate, regular rhythm and normal heart sounds.   Pulmonary/Chest: Effort normal and breath sounds normal. No respiratory distress. He has no wheezes.  Abdominal: Soft. Bowel sounds are normal. He exhibits no distension. There is no rigidity, no guarding and no CVA tenderness.  Generalized tenderness with palpation more notably of the right upper quadrant.   Musculoskeletal: Normal range of motion.  Lymphadenopathy:    He has no cervical adenopathy.  Neurological: He is alert. Coordination normal.  Skin: He is not diaphoretic.  Nursing note and vitals reviewed.   ED Course  Procedures (including critical care time) Labs Review Labs Reviewed  COMPREHENSIVE METABOLIC PANEL - Abnormal; Notable for the following:    Glucose, Bld 106 (*)    ALT 11 (*)    All other components within normal limits  CBC - Abnormal; Notable for the following:    Hemoglobin 12.1 (*)    HCT 38.0 (*)    MCV 75.7 (*)    MCH 24.1 (*)    RDW 17.2 (*)    All other components within normal limits  URINALYSIS, ROUTINE W REFLEX MICROSCOPIC (NOT [facility]) - Abnormal; Notable for the following:    Color, Urine AMBER (*)    APPearance CLOUDY (*)    Specific Gravity, Urine 1.031 (*)    Bilirubin Urine MODERATE (*)    Ketones, ur 15 (*)    Protein, ur  30 (*)    All other components within normal limits  URINE MICROSCOPIC-ADD ON - Abnormal; Notable for the following:    Squamous Epithelial / LPF 0-5 (*)    Bacteria, UA RARE (*)    Crystals CA OXALATE CRYSTALS (*)    All other components within normal limits  LIPASE, BLOOD    Imaging Review Dg Abd Acute W/chest  10/31/2015  CLINICAL DATA:  Right abdominal pain with nausea and vomiting for 3 days EXAM: DG ABDOMEN ACUTE W/ 1V CHEST COMPARISON:  Acute abdominal series done 07/03/2015. FINDINGS: The heart size and mediastinal contours are normal. The lungs are clear. There is no pleural effusion or pneumothorax. No acute osseous findings are identified. The bowel gas pattern is normal. There is no free intraperitoneal air. Possible tiny left renal calculus, unchanged. A left pelvic calcification is also stable. No new abdominal calcifications seen. Cholecystectomy clips are noted. The bones appear unremarkable. IMPRESSION: No acute cardiopulmonary or abdominal process. Possible tiny left renal calculus. Electronically Signed    By: Carey Bullocks M.D.   On: 10/31/2015 09:31   I have personally reviewed and evaluated these images and lab results as part of my medical decision-making.   MDM   Final diagnoses:  Non-intractable vomiting with nausea, vomiting of unspecified type  Throat pain   Afebrile well-appearing patient with a past medical history of anemia, depression, anxiety, and bipolar disorder who presents the ED with 2 weeks of throat pain and 2 days of vomiting. He states his pain is greater than his baseline pain. Will give patient Phenergan and GI cocktail to see if his. symptoms subside. We'll reassess. VSS  0945: Discussed the results of the abdomen/chest x-ray with the patient and informed him of there is no finding of free air. Informed him there is little suspicion for perforated esophagus which was his concern. Informed him that his blood levels are close to his baseline he states no hematemesis or hematochezia so less concerning for upper GI bleed.  Patient states he is still nauseated after the Phenergan and a GI cocktail. Gave him Dilaudid for pain and will reassess.   This could be a gastritis, esophagitis, or GERD. Not concerning for cholecystitis as patient has had a cholecystectomy. Airway patent and no edema of the oropharynx no concern for epiglottitis or Ludwig's. Patient states he is still nauseated after the Phenergan and a GI cocktail. Gave him Dilaudid for pain and will reassess.   1110: Patient states his pain is well-controlled and his nausea and vomiting have subsided. Patient is relieved to hear that his x-ray was negative and he is comfortable being discharged and following up with his primary care doctor in his GI doctor. Patient states he does not have a history of acid reflux but he does have a history of H. pylori for which he was treated for. I instructed him to follow up with his GI doctor because his pain could be related to H. pylori or acid reflux. I suggested the patient take  Prilosec daily and follow up tomorrow with his PCP. I discussed strict return precautions and he expressed understanding to the discharge instructions.      Jerre Simon, PA 10/31/15 1134  Gerhard Munch, MD 10/31/15 [phone]

## 2015-10-31 NOTE — Telephone Encounter (Signed)
No showed new patient appointment. 

## 2015-10-31 NOTE — Discharge Instructions (Signed)
Follow-up with your primary care provider and your gastroenterologist within 1 day regarding your visit to the emergency department today.  Return to the emergency department if you experience worsening pain, uncontrolled vomiting, blood in your vomit, fever or you pass out.  Nausea and Vomiting Nausea means you feel sick to your stomach. Throwing up (vomiting) is a reflex where stomach contents come out of your mouth. HOME CARE   Take medicine as told by your doctor.  Do not force yourself to eat. However, you do need to drink fluids.  If you feel like eating, eat a normal diet as told by your doctor.  Eat rice, wheat, potatoes, bread, lean meats, yogurt, fruits, and vegetables.  Avoid high-fat foods.  Drink enough fluids to keep your pee (urine) clear or pale yellow.  Ask your doctor how to replace body fluid losses (rehydrate). Signs of body fluid loss (dehydration) include:  Feeling very thirsty.  Dry lips and mouth.  Feeling dizzy.  Dark pee.  Peeing less than normal.  Feeling confused.  Fast breathing or heart rate. GET HELP RIGHT AWAY IF:   You have blood in your throw up.  You have black or bloody poop (stool).  You have a bad headache or stiff neck.  You feel confused.  You have bad belly (abdominal) pain.  You have chest pain or trouble breathing.  You do not pee at least once every 8 hours.  You have cold, clammy skin.  You keep throwing up after 24 to 48 hours.  You have a fever. MAKE SURE YOU:   Understand these instructions.  Will watch your condition.  Will get help right away if you are not doing well or get worse.   This information is not intended to replace advice given to you by your health care provider. Make sure you discuss any questions you have with your health care provider.   Document Released: 12/10/2007 Document Revised: 09/15/2011 Document Reviewed: 11/22/2010 Elsevier Interactive Patient Education [employer]2016 Elsevier  Inc.

## 2015-10-31 NOTE — ED Notes (Signed)
Pt sts sore throat and vomiting x 1 week; pt sts hx of same with "torn esophagus"

## 2015-11-01 ENCOUNTER — Encounter: Payer: Self-pay | Admitting: Neurology

## 2015-11-01 ENCOUNTER — Encounter: Payer: Self-pay | Admitting: Family Medicine

## 2015-11-01 ENCOUNTER — Encounter: Payer: Self-pay | Admitting: Clinical

## 2015-11-01 ENCOUNTER — Ambulatory Visit: Payer: No Typology Code available for payment source | Attending: Family Medicine | Admitting: Family Medicine

## 2015-11-01 VITALS — BP 104/68 | HR 91 | Temp 97.8°F | Resp 16 | Ht 67.0 in | Wt 124.0 lb

## 2015-11-01 DIAGNOSIS — Z79899 Other long term (current) drug therapy: Secondary | ICD-10-CM | POA: Insufficient documentation

## 2015-11-01 DIAGNOSIS — R0789 Other chest pain: Secondary | ICD-10-CM | POA: Insufficient documentation

## 2015-11-01 DIAGNOSIS — R05 Cough: Secondary | ICD-10-CM | POA: Insufficient documentation

## 2015-11-01 DIAGNOSIS — F329 Major depressive disorder, single episode, unspecified: Secondary | ICD-10-CM

## 2015-11-01 DIAGNOSIS — F32A Depression, unspecified: Secondary | ICD-10-CM

## 2015-11-01 DIAGNOSIS — F419 Anxiety disorder, unspecified: Secondary | ICD-10-CM | POA: Insufficient documentation

## 2015-11-01 MED ORDER — CLONAZEPAM 1 MG PO TABS: 1.0000 mg | ORAL_TABLET | Freq: Two times a day (BID) | ORAL | Status: AC

## 2015-11-01 NOTE — Telephone Encounter (Signed)
Pt has appointment today with PCP.

## 2015-11-01 NOTE — Progress Notes (Signed)
F/U abdominal and throat pain  Pt stated last night had pain in the rib cage area, with breath  Pain scale #8 No tobacco user  No suicidal thoughts in the past two weeks

## 2015-11-01 NOTE — Assessment & Plan Note (Signed)
Chest pressure and throat pressure consistent with anxiety in setting of being out of klonopin for one week.  Refilled klonopin Once again strongly advised patient to go to mental health

## 2015-11-01 NOTE — Progress Notes (Signed)
Subjective:  Patient ID: Levi GunningMichael N Trimble, male    DOB: [birth year]  Age: 30 y.o. MRN: [id number]  CC: Follow-up   HPI Levi Palmer presents for    1 anxiety: one week of neck stiffness, anterior throat pressures. Chest pressure. Out of klnopin for one week. No fever or chills. Neck stiffness started following a violent coughing episode. stiffness is now improving.    Social History  Substance Use Topics  . Smoking status: Never Smoker   . Smokeless tobacco: Never Used  . Alcohol Use: No    Outpatient Prescriptions Prior to Visit  Medication Sig Dispense Refill  . acetaminophen-codeine (TYLENOL #3) 300-30 MG tablet Take 1 tablet by mouth every 6 (six) hours as needed for moderate pain. 120 tablet 0  . calcium carbonate (OS-CAL) 1250 (500 Ca) MG chewable tablet Chew 1 tablet by mouth daily.    . clonazePAM (KLONOPIN) 1 MG tablet Take 1 tablet (1 mg total) by mouth 2 (two) times daily. 60 tablet 2  . clotrimazole (MYCELEX) 10 MG troche Take 1 tablet (10 mg total) by mouth 5 (five) times daily. 35 tablet 0  . diphenhydrAMINE (BENADRYL) 25 mg capsule Take 50 mg by mouth at bedtime as needed for sleep.    Marland Kitchen. ibuprofen (ADVIL,MOTRIN) 200 MG tablet Take 400-800 mg by mouth every 6 (six) hours as needed for moderate pain.     Marland Kitchen. LINZESS 290 MCG CAPS capsule Take 290 mcg by mouth daily.  2  . naproxen (NAPROSYN) 250 MG tablet Take 1 tablet (250 mg total) by mouth 2 (two) times daily with a meal. 30 tablet 0  . pregabalin (LYRICA) 50 MG capsule Take 1 capsule (50 mg total) by mouth 3 (three) times daily. 90 capsule 0  . promethazine (PHENERGAN) 25 MG tablet Take 1 tablet (25 mg total) by mouth every 8 (eight) hours as needed for nausea or vomiting. 60 tablet 2  . Vitamin D, Ergocalciferol, (DRISDOL) 50000 units CAPS capsule Take 1 capsule (50,000 Units total) by mouth every 7 (seven) days. For 8 weeks 8 capsule 0   No facility-administered medications prior to visit.    ROS Review  of Systems  Constitutional: Negative for fever, chills, fatigue and unexpected weight change.  HENT: Positive for sore throat.   Eyes: Negative for visual disturbance.  Respiratory: Negative for cough and shortness of breath.   Cardiovascular: Positive for chest pain (pressure ). Negative for palpitations and leg swelling.  Gastrointestinal: Positive for nausea, vomiting and abdominal pain. Negative for diarrhea, constipation and blood in stool.  Endocrine: Negative for polydipsia, polyphagia and polyuria.  Genitourinary: Negative for dysuria, urgency, frequency, hematuria, decreased urine volume, discharge, penile swelling, scrotal swelling, genital sores, penile pain and testicular pain.  Musculoskeletal: Positive for neck pain. Negative for myalgias, back pain, arthralgias and gait problem.  Skin: Negative for rash.  Allergic/Immunologic: Negative for immunocompromised state.  Hematological: Negative for adenopathy. Does not bruise/bleed easily.  Psychiatric/Behavioral: Negative for suicidal ideas, sleep disturbance and dysphoric mood. The patient is not nervous/anxious.     Objective:  BP 104/68 mmHg  Pulse 91  Temp(Src) 97.8 F (36.6 C) (Oral)  Resp 16  Ht 5\' 7"  (1.702 m)  Wt 124 lb (56.246 kg)  BMI 19.42 kg/m2  SpO2 100%  BP/Weight 11/01/2015 10/31/2015 10/16/2015  Systolic BP 104 113 99  Diastolic BP 68 79 60  Wt. (Lbs) 124 121.56 129  BMI 19.42 19.03 20.2   Physical Exam  Constitutional: He appears well-developed and  well-nourished. No distress.  HENT:  Head: Normocephalic and atraumatic.  Mouth/Throat: Oropharynx is clear and moist and mucous membranes are normal. No oral lesions. Abnormal dentition. Dental caries present. No dental abscesses.  Multiple broken teeth   Neck: Normal range of motion. Neck supple. No tracheal deviation present. No thyromegaly present.  Cardiovascular: Normal rate, regular rhythm, normal heart sounds and intact distal pulses.     Pulmonary/Chest: Effort normal and breath sounds normal.  Abdominal: Soft. Bowel sounds are normal. He exhibits no distension and no mass. There is no hepatosplenomegaly, splenomegaly or hepatomegaly. There is no tenderness. There is no rebound, no guarding and no CVA tenderness. No hernia. Hernia confirmed negative in the ventral area, confirmed negative in the right inguinal area and confirmed negative in the left inguinal area.  Genitourinary: Testes normal and penis normal. Right testis shows no mass and no swelling. Left testis shows no mass and no swelling. Uncircumcised.  Musculoskeletal: He exhibits no edema.  Lymphadenopathy:    He has no cervical adenopathy.  Neurological: He is alert.  Skin: Skin is warm and dry. No rash noted. No erythema.  Psychiatric: He exhibits a depressed mood.   Depression screen [facility] 2/9 11/01/2015 10/16/2015 09/20/2015  Decreased Interest Down, Depressed, Hopeless PHQ - 2 Score Altered sleeping Tired, decreased energy Change in appetite Feeling bad or failure about yourself  Trouble concentrating Moving slowly or fidgety/restless Suicidal thoughts PHQ-9 Score GAD 7 : Generalized Anxiety Score 11/01/2015 10/16/2015 09/20/2015 09/03/2015  Nervous, Anxious, on Edge Control/stop worrying Worry too much - different things Trouble relaxing Restless 0  Easily annoyed or irritable Afraid - awful might happen Total GAD 7 Score Assessment & Plan:   There are no diagnoses linked to this encounter.  No orders of the defined types were placed in this encounter.    Follow-up: No Follow-up on file.   Dessa Phi MD

## 2015-11-01 NOTE — Progress Notes (Signed)
[facility] 2/[facility] 11/01/2015 10/16/2015 09/20/2015 09/03/2015 09/03/2015  Decreased Interest 3 3 3 2 2   Down, Depressed, Hopeless 3 3 3 3 3   PHQ - 2 Score 6 6 6 5 5   Altered sleeping 2 2 2 1 1   Tired, decreased energy 3 3 3 3 3   Change in appetite 3 3 3 3 3   Feeling bad or failure about yourself  3 3 3 3 3   Trouble concentrating 2 3 3 2 2   Moving slowly or fidgety/restless 2 3 3 3 3   Suicidal thoughts 2 3 3  0 0  PHQ-9 Score 23 26 26 20 20    *No plans to end life in last 2 weeks   GAD 7 : Generalized Anxiety Score 11/01/2015 10/16/2015 09/20/2015 09/03/2015  Nervous, Anxious, on Edge 3 3 3 3   Control/stop worrying 3 3 3 3   Worry too much - different things 3 3 3 2   Trouble relaxing 3 3 3 3   Restless 3 1 3  0  Easily annoyed or irritable 3 3 3 1   Afraid - awful might happen 3 3 2 2   Total GAD 7 Score 21 19 20  14

## 2015-11-01 NOTE — Patient Instructions (Addendum)
Levi Palmer was seen today for follow-up.  Diagnoses and all orders for this visit:  Anxiety -     Ambulatory referral to Behavioral Health  Depression -     Ambulatory referral to Behavioral Health  klonopin refilled  [facility] this [facility] or [facility]   F/u in 2 months   Dr. Armen PickupFunches

## 2015-11-01 NOTE — Telephone Encounter (Signed)
Klonopin refilled With 2 refills and ready for pick up Patient has appt scheduled for today

## 2015-11-05 ENCOUNTER — Encounter: Payer: Self-pay | Admitting: Family Medicine

## 2015-11-07 ENCOUNTER — Encounter: Payer: Self-pay | Admitting: Family Medicine

## 2015-11-07 ENCOUNTER — Ambulatory Visit: Payer: Self-pay

## 2015-11-08 ENCOUNTER — Telehealth: Payer: Self-pay | Admitting: Family Medicine

## 2015-11-08 DIAGNOSIS — M25562 Pain in left knee: Secondary | ICD-10-CM

## 2015-11-08 DIAGNOSIS — M542 Cervicalgia: Secondary | ICD-10-CM

## 2015-11-08 DIAGNOSIS — F329 Major depressive disorder, single episode, unspecified: Secondary | ICD-10-CM

## 2015-11-08 DIAGNOSIS — R45851 Suicidal ideations: Secondary | ICD-10-CM

## 2015-11-08 DIAGNOSIS — F32A Depression, unspecified: Secondary | ICD-10-CM

## 2015-11-08 DIAGNOSIS — F419 Anxiety disorder, unspecified: Secondary | ICD-10-CM

## 2015-11-08 NOTE — Telephone Encounter (Signed)
Called patient to follow up the mychart message he sent last night,   "I cant take this anymore. You tell me to go talk to the GI, or the ortho about th pain but then theynall turn around and tell me i have to talk to you. Im suffering and this is not ok by any means. The only option i see to end this is killing myself because nobody wants to treat my pain seriously. I will not pee in a cup to get medication, im no criminal. This is leading me closer to takimg my own life and i need this to be taken seriously before my mother has to bury me. Please.   Levi Palmer."    Verified name and DOB. Patient reports that he is in generalized pain. He feels like he is getting the run around. Pain "every where". Feeling depressed and anxious.  He is amenable to pain management for knee and neck pain, I reminded him that pain management does not treat chronic abdominal pain. I reminded him that he would have to stop smoking marijuana in order to receive any narcotics from pain management.   He has not gone to family services for mental health evaluation stating that he "has not had the time". I challenged this statement reminding him that I have recommended this for almost 6 months now and he consistently  states that he had "nothad the time".   He denies suicidal plan. He states he does not plan to take his own life.   I informed him that the statement is a threat and not appropriate. He apologized stating that he did not mean it to be a threat.   Plan: Pain management referral Continue current treatment regimen, he reports lyrica has been approved he is awaiting the shipment.  Psychiatric evaluation, referral placed   Patient agreed with plan and voiced understanding

## 2015-11-10 ENCOUNTER — Emergency Department (HOSPITAL_COMMUNITY)
Admission: EM | Admit: 2015-11-10 | Discharge: 2015-11-10 | Disposition: A | Discharge status: Home / Self Care | Payer: No Typology Code available for payment source | Attending: Emergency Medicine | Admitting: Emergency Medicine

## 2015-11-10 ENCOUNTER — Encounter (HOSPITAL_COMMUNITY): Payer: Self-pay | Admitting: Emergency Medicine

## 2015-11-10 DIAGNOSIS — A058 Other specified bacterial foodborne intoxications: Secondary | ICD-10-CM | POA: Insufficient documentation

## 2015-11-10 DIAGNOSIS — F319 Bipolar disorder, unspecified: Secondary | ICD-10-CM | POA: Insufficient documentation

## 2015-11-10 DIAGNOSIS — D649 Anemia, unspecified: Secondary | ICD-10-CM

## 2015-11-10 DIAGNOSIS — R1084 Generalized abdominal pain: Secondary | ICD-10-CM

## 2015-11-10 DIAGNOSIS — Z791 Long term (current) use of non-steroidal anti-inflammatories (NSAID): Secondary | ICD-10-CM | POA: Insufficient documentation

## 2015-11-10 DIAGNOSIS — R197 Diarrhea, unspecified: Secondary | ICD-10-CM

## 2015-11-10 DIAGNOSIS — A084 Viral intestinal infection, unspecified: Secondary | ICD-10-CM

## 2015-11-10 DIAGNOSIS — R112 Nausea with vomiting, unspecified: Secondary | ICD-10-CM

## 2015-11-10 DIAGNOSIS — Z79899 Other long term (current) drug therapy: Secondary | ICD-10-CM | POA: Insufficient documentation

## 2015-11-10 DIAGNOSIS — A059 Bacterial foodborne intoxication, unspecified: Secondary | ICD-10-CM

## 2015-11-10 LAB — CBC WITH DIFFERENTIAL/PLATELET
Basophils Absolute: 0 10*3/uL (ref 0.0–0.1)
Basophils Relative: 0 %
EOS PCT: 1 %
Eosinophils Absolute: 0.1 10*3/uL (ref 0.0–0.7)
HEMATOCRIT: 36.2 % — AB (ref 39.0–52.0)
HEMOGLOBIN: 11.7 g/dL — AB (ref 13.0–17.0)
LYMPHS ABS: 1.7 10*3/uL (ref 0.7–4.0)
LYMPHS PCT: 18 %
MCH: 24.6 pg — AB (ref 26.0–34.0)
MCHC: 32.3 g/dL (ref 30.0–36.0)
MCV: 76.1 fL — AB (ref 78.0–100.0)
Monocytes Absolute: 0.6 10*3/uL (ref 0.1–1.0)
Monocytes Relative: 6 %
NEUTROS ABS: 7.3 10*3/uL (ref 1.7–7.7)
Neutrophils Relative %: 75 %
Platelets: 337 10*3/uL (ref 150–400)
RBC: 4.76 MIL/uL (ref 4.22–5.81)
RDW: 17.9 % — ABNORMAL HIGH (ref 11.5–15.5)
WBC: 9.7 10*3/uL (ref 4.0–10.5)

## 2015-11-10 LAB — URINALYSIS, ROUTINE W REFLEX MICROSCOPIC
Bilirubin Urine: NEGATIVE
GLUCOSE, UA: NEGATIVE mg/dL
HGB URINE DIPSTICK: NEGATIVE
Ketones, ur: NEGATIVE mg/dL
LEUKOCYTES UA: NEGATIVE
Nitrite: NEGATIVE
PROTEIN: NEGATIVE mg/dL
SPECIFIC GRAVITY, URINE: 1.018 (ref 1.005–1.030)
pH: 6 (ref 5.0–8.0)

## 2015-11-10 LAB — COMPREHENSIVE METABOLIC PANEL
ALBUMIN: 4.3 g/dL (ref 3.5–5.0)
ALT: 12 U/L — ABNORMAL LOW (ref 17–63)
ANION GAP: 10 (ref 5–15)
AST: 13 U/L — AB (ref 15–41)
Alkaline Phosphatase: 58 U/L (ref 38–126)
BUN: 13 mg/dL (ref 6–20)
CHLORIDE: 107 mmol/L (ref 101–111)
CO2: 24 mmol/L (ref 22–32)
Calcium: 9.2 mg/dL (ref 8.9–10.3)
Creatinine, Ser: 0.77 mg/dL (ref 0.61–1.24)
GFR calc Af Amer: >60 mL/min (ref >60)
GFR calc non Af Amer: >60 mL/min (ref >60)
GLUCOSE: 92 mg/dL (ref 65–99)
POTASSIUM: 3.9 mmol/L (ref 3.5–5.1)
Sodium: 141 mmol/L (ref 135–145)
TOTAL PROTEIN: 7.6 g/dL (ref 6.5–8.1)
Total Bilirubin: 0.4 mg/dL (ref 0.3–1.2)

## 2015-11-10 LAB — I-STAT TROPONIN, ED: TROPONIN I, POC: 0 ng/mL (ref 0.00–0.08)

## 2015-11-10 LAB — LIPASE, BLOOD: LIPASE: 19 U/L (ref 11–51)

## 2015-11-10 MED ORDER — SODIUM CHLORIDE 0.9 % IV BOLUS (SEPSIS)
1000.0000 mL | Freq: Once | INTRAVENOUS | Status: AC
  Administered 2015-11-10: 1000 mL via INTRAVENOUS

## 2015-11-10 MED ORDER — PROCHLORPERAZINE MALEATE 10 MG PO TABS: 10.0000 mg | ORAL_TABLET | Freq: Two times a day (BID) | ORAL | Status: DC | PRN

## 2015-11-10 MED ORDER — LORAZEPAM 2 MG/ML IJ SOLN
1.0000 mg | Freq: Once | INTRAMUSCULAR | Status: AC
  Administered 2015-11-10: 1 mg via INTRAVENOUS
  Filled 2015-11-10: qty 1

## 2015-11-10 MED ORDER — PROCHLORPERAZINE EDISYLATE 5 MG/ML IJ SOLN
10.0000 mg | Freq: Once | INTRAMUSCULAR | Status: AC
  Administered 2015-11-10: 10 mg via INTRAVENOUS
  Filled 2015-11-10: qty 2

## 2015-11-10 MED ORDER — PROMETHAZINE HCL 25 MG RE SUPP: 25.0000 mg | Freq: Four times a day (QID) | RECTAL | Status: DC | PRN

## 2015-11-10 MED ORDER — PROMETHAZINE HCL 25 MG PO TABS
25.0000 mg | ORAL_TABLET | Freq: Once | ORAL | Status: AC
  Administered 2015-11-10: 25 mg via ORAL
  Filled 2015-11-10: qty 1

## 2015-11-10 MED ORDER — MORPHINE SULFATE (PF) 4 MG/ML IV SOLN
4.0000 mg | Freq: Once | INTRAVENOUS | Status: AC
  Administered 2015-11-10: 4 mg via INTRAVENOUS
  Filled 2015-11-10: qty 1

## 2015-11-10 MED ORDER — GI COCKTAIL ~~LOC~~
30.0000 mL | Freq: Once | ORAL | Status: AC
  Administered 2015-11-10: 30 mL via ORAL
  Filled 2015-11-10: qty 30

## 2015-11-10 MED ORDER — ONDANSETRON HCL 4 MG/2ML IJ SOLN
4.0000 mg | Freq: Once | INTRAMUSCULAR | Status: AC
  Administered 2015-11-10: 4 mg via INTRAVENOUS
  Filled 2015-11-10: qty 2

## 2015-11-10 MED ORDER — PANTOPRAZOLE SODIUM 40 MG IV SOLR
40.0000 mg | Freq: Once | INTRAVENOUS | Status: AC
  Administered 2015-11-10: 40 mg via INTRAVENOUS
  Filled 2015-11-10: qty 40

## 2015-11-10 MED ORDER — RANITIDINE HCL 150 MG PO TABS: 150.0000 mg | ORAL_TABLET | Freq: Two times a day (BID) | ORAL | Status: DC

## 2015-11-10 MED ORDER — PROMETHAZINE HCL 25 MG RE SUPP
25.0000 mg | Freq: Once | RECTAL | Status: DC
  Filled 2015-11-10: qty 1

## 2015-11-10 NOTE — ED Provider Notes (Signed)
CSN: [id number]     Arrival date & time 11/10/15  0714 History   First MD Initiated Contact with Patient 11/10/15 [phone]     Chief Complaint  Patient presents with  . N/V      (Consider location/radiation/quality/duration/timing/severity/associated sxs/prior Treatment) HPI Comments: Levi Palmer is a 30 y.o. male with a PMHx of anxiety, anemia, depression, bipolar disorder, cirrhosis, and chronic constipation, with a PSHx of cholecystectomy and EGD 05/2015 which revealed candidial esophagitis, who presents to the ED with complaints of 3 days of nausea, vomiting, and diarrhea. Patient reports that he has had 6-7 nonbloody nonbilious episodes of emesis in the last 24 hours, 3 episodes of nonbloody watery diarrhea the last 24 hours, and ongoing nausea despite taking his home Phenergan. He also reports having 9/10 generalized abdominal burning pain which is constant and nonradiating, worse with vomiting, and unrelieved with Tylenol and ibuprofen. Additionally he reports that he gets lightheaded when he stands up, states he feels dehydrated. He recalls eating fish prior to symptom onset, thinks he may have undercooked fish and this may have caused his GI illness. He does admit to taking NSAIDs occasionally.  He denies any fevers, chills, chest pain, shortness breath, hematemesis, melena, hematochezia, obstipation, constipation, dysuria, hematuria, testicular pain or swelling, penile discharge, numbness, tingling, or focal weakness. Denies any recent travel, sick contacts, alcohol use, or recent antibiotics. Has GI appt on 11/16/15  Patient is a 30 y.o. male presenting with vomiting. The history is provided by the patient and medical records. No language interpreter was used.  Emesis Severity:  Mild Duration:  3 days Timing:  Constant Number of daily episodes:  6-7x in the last 24hrs Quality:  Stomach contents Progression:  Unchanged Chronicity:  Recurrent Recent urination:  Normal Relieved by:   Nothing Worsened by:  Nothing tried Ineffective treatments:  Antiemetics Associated symptoms: abdominal pain and diarrhea   Associated symptoms: no arthralgias, no chills and no myalgias   Risk factors: suspect food intake   Risk factors: no sick contacts and no travel to endemic areas     Past Medical History  Diagnosis Date  . Medical history non-contributory   . Anxiety   . Panic attack as reaction to stress   . Anemia   . Depression   . Gallstones   . Dysrhythmia     irregular heart rate - due to zoloft  . Bipolar disorder (HCC)   . Cirrhosis (HCC)   . Constipation    Past Surgical History  Procedure Laterality Date  . Cholecystectomy    . Tonsillectomy    . Incise and drain abcess    . Mouth surgery    . Esophagogastroduodenoscopy N/A 05/22/2015    Procedure: ESOPHAGOGASTRODUODENOSCOPY (EGD);  Surgeon: Vida Rigger, MD;  Location[facility] ENDOSCOPY;  Service: Endoscopy;  Laterality: N/A;   Family History  Problem Relation Age of Onset  . Irritable bowel syndrome Mother   . Kidney disease Mother   . Ulcerative colitis Maternal Grandfather   . Colon cancer Neg Hx   . Colon polyps Neg Hx   . Gallbladder disease Neg Hx   . Esophageal cancer Neg Hx   . Diabetes Neg Hx    Social History  Substance Use Topics  . Smoking status: Never Smoker   . Smokeless tobacco: Never Used  . Alcohol Use: No    Review of Systems  Constitutional: Negative for fever and chills.  Respiratory: Negative for shortness of breath.   Cardiovascular: Negative for chest pain.  Gastrointestinal: Positive for nausea, vomiting, abdominal pain and diarrhea. Negative for constipation, blood in stool and anal bleeding.  Genitourinary: Negative for dysuria, hematuria, discharge, scrotal swelling and testicular pain.  Musculoskeletal: Negative for myalgias and arthralgias.  Skin: Negative for color change.  Allergic/Immunologic: Negative for immunocompromised state.  Neurological: Positive for  light-headedness (with standing). Negative for weakness and numbness.  Psychiatric/Behavioral: Negative for confusion.   10 Systems reviewed and are negative for acute change except as noted in the HPI.    Allergies  Reglan; Toradol; Tramadol; and Zoloft  Home Medications   Prior to Admission medications   Medication Sig Start Date End Date Taking? Authorizing Provider  acetaminophen-codeine (TYLENOL #3) 300-30 MG tablet Take 1 tablet by mouth every 6 (six) hours as needed for moderate pain. 10/16/15   Josalyn Funches, MD  calcium carbonate (OS-CAL) 1250 (500 Ca) MG chewable tablet Chew 1 tablet by mouth daily.    Historical Provider, MD  clonazePAM (KLONOPIN) 1 MG tablet Take 1 tablet (1 mg total) by mouth 2 (two) times daily. 11/01/15   Josalyn Funches, MD  diphenhydrAMINE (BENADRYL) 25 mg capsule Take 50 mg by mouth at bedtime as needed for sleep.    Historical Provider, MD  ibuprofen (ADVIL,MOTRIN) 200 MG tablet Take 400-800 mg by mouth every 6 (six) hours as needed for moderate pain.     Historical Provider, MD  LINZESS 290 MCG CAPS capsule Take 290 mcg by mouth daily. 07/16/15   Historical Provider, MD  naproxen (NAPROSYN) 250 MG tablet Take 1 tablet (250 mg total) by mouth 2 (two) times daily with a meal. 10/10/15   Everlene Farrier, PA-C  pregabalin (LYRICA) 50 MG capsule Take 1 capsule (50 mg total) by mouth 3 (three) times daily. Patient not taking: Reported on 11/08/2015 09/20/15   Dessa Phi, MD  promethazine (PHENERGAN) 25 MG tablet Take 1 tablet (25 mg total) by mouth every 8 (eight) hours as needed for nausea or vomiting. 10/16/15   Dessa Phi, MD  Vitamin D, Ergocalciferol, (DRISDOL) 50000 units CAPS capsule Take 1 capsule (50,000 Units total) by mouth every 7 (seven) days. For 8 weeks 09/20/15   Dessa Phi, MD   BP 112/71 mmHg  Pulse 79  Temp(Src) 97.5 F (36.4 C) (Oral)  Resp 18  SpO2 98% Physical Exam  Constitutional: He is oriented to person, place, and time.  Vital signs are normal. He appears well-developed and well-nourished.  Non-toxic appearance. No distress.  Afebrile, nontoxic, NAD  HENT:  Head: Normocephalic and atraumatic.  Mouth/Throat: Oropharynx is clear and moist. Mucous membranes are dry.  Mildly dry mucous membranes  Eyes: Conjunctivae and EOM are normal. Right eye exhibits no discharge. Left eye exhibits no discharge.  Neck: Normal range of motion. Neck supple.  Cardiovascular: Normal rate, regular rhythm, normal heart sounds and intact distal pulses.  Exam reveals no gallop and no friction rub.   No murmur heard. Pulmonary/Chest: Effort normal and breath sounds normal. No respiratory distress. He has no decreased breath sounds. He has no wheezes. He has no rhonchi. He has no rales.  Abdominal: Soft. Normal appearance and bowel sounds are normal. He exhibits no distension. There is generalized tenderness. There is no rigidity, no rebound, no guarding, no CVA tenderness, no tenderness at McBurney's point and negative Murphy's sign.  Soft, nondistended, +BS throughout, with mild diffuse abdominal TTP without focal areas of tenderness, no r/g/r, neg murphy's, neg mcburney's, no CVA TTP   Musculoskeletal: Normal range of motion.  Neurological: He is  alert and oriented to person, place, and time. He has normal strength. No sensory deficit.  Skin: Skin is warm, dry and intact. No rash noted.  Psychiatric: He has a normal mood and affect.  Nursing note and vitals reviewed.   ED Course  Procedures (including critical care time) Labs Review Labs Reviewed  COMPREHENSIVE METABOLIC PANEL - Abnormal; Notable for the following:    AST 13 (*)    ALT 12 (*)    All other components within normal limits  CBC WITH DIFFERENTIAL/PLATELET - Abnormal; Notable for the following:    Hemoglobin 11.7 (*)    HCT 36.2 (*)    MCV 76.1 (*)    MCH 24.6 (*)    RDW 17.9 (*)    All other components within normal limits  LIPASE, BLOOD  URINALYSIS, ROUTINE  W REFLEX MICROSCOPIC (NOT AT [facility])  I-STAT TROPOININ, ED    Imaging Review No results found. I have personally reviewed and evaluated these images and lab results as part of my medical decision-making.   EKG Interpretation   Date/Time:  Saturday Nov 10 2015 08:01:25 EDT Ventricular Rate:  72 PR Interval:  151 QRS Duration: 99 QT Interval:  372 QTC Calculation: 407 R Axis:   84 Text Interpretation:  Sinus rhythm RSR' in V1 or V2, right VCD or RVH No  acute changes No significant change since last tracing Confirmed by  Rhunette CroftNANAVATI, MD, Janey GentaANKIT [phone]([id number]) on 11/10/2015 9:51:08 AM      MDM   Final diagnoses:  Nausea vomiting and diarrhea  Generalized abdominal pain  Foodborne gastroenteritis  Viral gastroenteritis  Chronic anemia    30 y.o. male here with nausea vomiting and diarrhea 3 days. Also has some generalized abdominal pain. Thinks he ate some undercooked fish. He has been seen in the ER several times for similar complaints, has an interesting history with Candidial esophagitis last year. On exam, mild generalized abdominal tenderness without focal findings or peritoneal findings. Mildly dry mucous membranes. He does endorse some lightheadedness with standing, likely due to mild dehydration from his GI illness, will get EKG and trop level. Will give fluids, pain meds, nausea meds, Protonix, and GI cocktail. Will get labs. Doubt need for imaging at this time. Likely viral GI illness versus food borne illness. Will reassess after lab work returns.  10:11 AM Labs unremarkable aside from baseline anemia (unchanged). U/A unremarkable, EKG unchanged from prior. Trop neg. Pt still feeling nauseated, states he vomited in the bathroom (unwitnessed). Will give ativan to see if we can improve nausea. Will also attempt to give PO phenergan since he states this helps the most. Would likely send home with suppository phenergan since this would eliminate the issue of not being able to get the  effects before vomiting the medicine up. Will give some more fluids while we give the ativan a chance to work, and PO challenge with the phenergan. Will reassess shortly.   10:56 AM Nursing staff reports that he claims that he vomited the phenergan back up. Will order suppository form, and give compazine. If he continues to vomit despite these measures, will need to admit for intractable n/v since we will have exhausted our antiemetic selections.  12:03 PM Pt feeling better after compazine, declined the phenergan suppository here because he stated his nausea has resolved. Pain still there but discussed that narcotics would make n/v worse and we want to avoid NSAIDs, he can take his home tylenol#3 for pain. Tolerated PO prior to discharge. Will send home  with compazine since this helped, and phenergan suppositories. Likely viral illness, doubt need for abx. Discussed BRAT diet and staying hydrated. Will start on zantac for GI protection. Discussed f/up with PCP in 5-7 days and with his GI doctor at his appt on 5/12. I explained the diagnosis and have given explicit precautions to return to the ER including for any other new or worsening symptoms. The patient understands and accepts the medical plan as it's been dictated and I have answered their questions. Discharge instructions concerning home care and prescriptions have been given. The patient is STABLE and is discharged to home in good condition.   BP 110/78 mmHg  Pulse 66  Temp(Src) 97.5 F (36.4 C) (Oral)  Resp 11  Ht 5\' 7"  (1.702 m)  Wt 54.432 kg  BMI 18.79 kg/m2  SpO2 96%  Meds ordered this encounter  Medications  . sodium chloride 0.9 % bolus 1,000 mL    Sig:   . ondansetron (ZOFRAN) injection 4 mg    Sig:   . pantoprazole (PROTONIX) injection 40 mg    Sig:   . gi cocktail (Maalox,Lidocaine,Donnatal)    Sig:   . morphine 4 MG/ML injection 4 mg    Sig:   . LORazepam (ATIVAN) injection 1 mg    Sig:   . promethazine (PHENERGAN)  tablet 25 mg    Sig:   . sodium chloride 0.9 % bolus 1,000 mL    Sig:   . promethazine (PHENERGAN) suppository 25 mg    Sig:   . prochlorperazine (COMPAZINE) injection 10 mg    Sig:   . ranitidine (ZANTAC) 150 MG tablet    Sig: Take 1 tablet (150 mg total) by mouth 2 (two) times daily.    Dispense:  60 tablet    Refill:  0    Order Specific Question:  Supervising Provider    Answer:  MILLER, BRIAN [[id number]]  . prochlorperazine (COMPAZINE) 10 MG tablet    Sig: Take 1 tablet (10 mg total) by mouth 2 (two) times daily as needed for nausea or vomiting.    Dispense:  20 tablet    Refill:  0    Order Specific Question:  Supervising Provider    Answer:  MILLER, BRIAN [[id number]]  . promethazine (PHENERGAN) 25 MG suppository    Sig: Place 1 suppository (25 mg total) rectally every 6 (six) hours as needed for nausea, vomiting or refractory nausea / vomiting.    Dispense:  28 suppository    Refill:  1    Order Specific Question:  Supervising Provider    Answer:  Eber Hong [[id number]]     Darren Nodal Camprubi-Soms, PA-C 11/10/15 1210  Derwood Kaplan, MD 11/10/15 1713

## 2015-11-10 NOTE — Discharge Instructions (Signed)
Use compazine and/or phenergan as prescribed, as needed for nausea. Use tylenol or your home tylenol #3 as needed for pain but don't drive while taking Tylenol #3. Stay well hydrated with small sips of water/gatorade throughout the day. Start taking zantac to help protect your stomach lining. Avoid NSAIDs like ibuprofen or aleve/motrin, etc. Avoid spicy or fatty foods, avoid coffee/tea/soda, and avoid acidic foods. Follow a BRAT (banana-rice-applesauce-toast) diet as described below for the next 24-48 hours. The 'BRAT' diet is suggested, then progress to diet as tolerated as symptoms abate. Call your regular doctor if bloody stools, persistent diarrhea, vomiting, fever or abdominal pain. Follow up with your regular doctor in 5-7 days for recheck of symptoms. Return to ER for changing or worsening of symptoms.  Food Choices to Help Relieve Diarrhea When you have diarrhea, the foods you eat and your eating habits are very important. Choosing the right foods and drinks can help relieve diarrhea. Also, because diarrhea can last up to 7 days, you need to replace lost fluids and electrolytes (such as sodium, potassium, and chloride) in order to help prevent dehydration.  WHAT GENERAL GUIDELINES DO I NEED TO FOLLOW?  Slowly drink 1 cup (8 oz) of fluid for each episode of diarrhea. If you are getting enough fluid, your urine will be clear or pale yellow.  Eat starchy foods. Some good choices include white rice, white toast, pasta, low-fiber cereal, baked potatoes (without the skin), saltine crackers, and bagels.  Avoid large servings of any cooked vegetables.  Limit fruit to two servings per day. A serving is  cup or 1 small piece.  Choose foods with less than 2 g of fiber per serving.  Limit fats to less than 8 tsp (38 g) per day.  Avoid fried foods.  Eat foods that have probiotics in them. Probiotics can be found in certain dairy products.  Avoid foods and beverages that may increase the speed at  which food moves through the stomach and intestines (gastrointestinal tract). Things to avoid include:  High-fiber foods, such as dried fruit, raw fruits and vegetables, nuts, seeds, and whole grain foods.  Spicy foods and high-fat foods.  Foods and beverages sweetened with high-fructose corn syrup, honey, or sugar alcohols such as xylitol, sorbitol, and mannitol. WHAT FOODS ARE RECOMMENDED? Grains White rice. White, [place], or pita breads (fresh or toasted), including plain rolls, buns, or bagels. White pasta. Saltine, soda, or graham crackers. Pretzels. Low-fiber cereal. Cooked cereals made with water (such as cornmeal, farina, or cream cereals). Plain muffins. Matzo. Melba toast. Zwieback.  Vegetables Potatoes (without the skin). Strained tomato and vegetable juices. Most well-cooked and canned vegetables without seeds. Tender lettuce. Fruits Cooked or canned applesauce, apricots, cherries, fruit cocktail, grapefruit, peaches, pears, or plums. Fresh bananas, apples without skin, cherries, grapes, cantaloupe, grapefruit, peaches, oranges, or plums.  Meat and Other Protein Products Baked or boiled chicken. Eggs. Tofu. Fish. Seafood. Smooth peanut butter. Ground or well-cooked tender beef, ham, veal, lamb, pork, or poultry.  Dairy Plain yogurt, kefir, and unsweetened liquid yogurt. Lactose-free milk, buttermilk, or soy milk. Plain hard cheese. Beverages Sport drinks. Clear broths. Diluted fruit juices (except prune). Regular, caffeine-free sodas such as ginger ale. Water. Decaffeinated teas. Oral rehydration solutions. Sugar-free beverages not sweetened with sugar alcohols. Other Bouillon, broth, or soups made from recommended foods.  The items listed above may not be a complete list of recommended foods or beverages. Contact your dietitian for more options. WHAT FOODS ARE NOT RECOMMENDED? Grains Whole grain, whole  wheat, bran, or rye breads, rolls, pastas, crackers, and cereals. Wild or  brown rice. Cereals that contain more than 2 g of fiber per serving. Corn tortillas or taco shells. Cooked or dry oatmeal. Granola. Popcorn. Vegetables Raw vegetables. Cabbage, broccoli, Brussels sprouts, artichokes, baked beans, beet greens, corn, kale, legumes, peas, sweet potatoes, and yams. Potato skins. Cooked spinach and cabbage. Fruits Dried fruit, including raisins and dates. Raw fruits. Stewed or dried prunes. Fresh apples with skin, apricots, mangoes, pears, raspberries, and strawberries.  Meat and Other Protein Products Chunky peanut butter. Nuts and seeds. Beans and lentils. Tomasa Blase.  Dairy High-fat cheeses. Milk, chocolate milk, and beverages made with milk, such as milk shakes. Cream. Ice cream. Sweets and Desserts Sweet rolls, doughnuts, and sweet breads. Pancakes and waffles. Fats and Oils Butter. Cream sauces. Margarine. Salad oils. Plain salad dressings. Olives. Avocados.  Beverages Caffeinated beverages (such as coffee, tea, soda, or energy drinks). Alcoholic beverages. Fruit juices with pulp. Prune juice. Soft drinks sweetened with high-fructose corn syrup or sugar alcohols. Other Coconut. Hot sauce. Chili powder. Mayonnaise. Gravy. Cream-based or milk-based soups.  The items listed above may not be a complete list of foods and beverages to avoid. Contact your dietitian for more information. WHAT SHOULD I DO IF I BECOME DEHYDRATED? Diarrhea can sometimes lead to dehydration. Signs of dehydration include dark urine and dry mouth and skin. If you think you are dehydrated, you should rehydrate with an oral rehydration solution. These solutions can be purchased at pharmacies, retail stores, or online.  Drink -1 cup (120-240 mL) of oral rehydration solution each time you have an episode of diarrhea. If drinking this amount makes your diarrhea worse, try drinking smaller amounts more often. For example, drink 1-3 tsp (5-15 mL) every 5-10 minutes.  A general rule for staying hydrated  is to drink 1-2 L of fluid per day. Talk to your health care provider about the specific amount you should be drinking each day. Drink enough fluids to keep your urine clear or pale yellow. Document Released: 09/13/2003 Document Revised: 06/28/2013 Document Reviewed: 05/16/2013 [facility] Patient Information 2015 [place], [place]. This information is not intended to replace advice given to you by your health care provider. Make sure you discuss any questions you have with your health care provider.   Abdominal Pain, Adult Many things can cause belly (abdominal) pain. Most times, the belly pain is not dangerous. Many cases of belly pain can be watched and treated at home. HOME CARE   Do not take medicines that help you go poop (laxatives) unless told to by your doctor.  Only take medicine as told by your doctor.  Eat or drink as told by your doctor. Your doctor will tell you if you should be on a special diet. GET HELP IF:  You do not know what is causing your belly pain.  You have belly pain while you are sick to your stomach (nauseous) or have runny poop (diarrhea).  You have pain while you pee or poop.  Your belly pain wakes you up at night.  You have belly pain that gets worse or better when you eat.  You have belly pain that gets worse when you eat fatty foods.  You have a fever. GET HELP RIGHT AWAY IF:   The pain does not go away within 2 hours.  You keep throwing up (vomiting).  The pain changes and is only in the right or left part of the belly.  You have bloody or tarry  looking poop. MAKE SURE YOU:   Understand these instructions.  Will watch your condition.  Will get help right away if you are not doing well or get worse.   This information is not intended to replace advice given to you by your health care provider. Make sure you discuss any questions you have with your health care provider.   Document Released: 12/10/2007 Document Revised: 07/14/2014 Document  Reviewed: 03/02/2013 Elsevier Interactive Patient Education 2016 [employer]  Viral Gastroenteritis Viral gastroenteritis is also called stomach flu. This illness is caused by a certain type of germ (virus). It can cause sudden watery poop (diarrhea) and throwing up (vomiting). This can cause you to lose body fluids (dehydration). This illness usually lasts for 3 to 8 days. It usually goes away on its own. HOME CARE   Drink enough fluids to keep your pee (urine) clear or pale yellow. Drink small amounts of fluids often.  Ask your doctor how to replace body fluid losses (rehydration).  Avoid:  Foods high in sugar.  Alcohol.  Bubbly (carbonated) drinks.  Tobacco.  Juice.  Caffeine drinks.  Very hot or cold fluids.  Fatty, greasy foods.  Eating too much at one time.  Dairy products until 24 to 48 hours after your watery poop stops.  You may eat foods with active cultures (probiotics). They can be found in some yogurts and supplements.  Wash your hands well to avoid spreading the illness.  Only take medicines as told by your doctor. Do not give aspirin to children. Do not take medicines for watery poop (antidiarrheals).  Ask your doctor if you should keep taking your regular medicines.  Keep all doctor visits as told. GET HELP RIGHT AWAY IF:   You cannot keep fluids down.  You do not pee at least once every 6 to 8 hours.  You are short of breath.  You see blood in your poop or throw up. This may look like coffee grounds.  You have belly (abdominal) pain that gets worse or is just in one small spot (localized).  You keep throwing up or having watery poop.  You have a fever.  The patient is a child younger than 3 months, and he or she has a fever.  The patient is a child older than 3 months, and he or she has a fever and problems that do not go away.  The patient is a child older than 3 months, and he or she has a fever and problems that suddenly get  worse.  The patient is a baby, and he or she has no tears when crying. MAKE SURE YOU:   Understand these instructions.  Will watch your condition.  Will get help right away if you are not doing well or get worse.   This information is not intended to replace advice given to you by your health care provider. Make sure you discuss any questions you have with your health care provider.   Document Released: 12/10/2007 Document Revised: 09/15/2011 Document Reviewed: 04/09/2011 Elsevier Interactive Patient Education 2016 Else[employer] Inc.  Nausea and Vomiting Nausea means you feel sick to your stomach. Throwing up (vomiting) is a reflex where stomach contents come out of your mouth. HOME CARE   Take medicine as told by your doctor.  Do not force yourself to eat. However, you do need to drink fluids.  If you feel like eating, eat a normal diet as told by your doctor.  Eat rice, wheat, potatoes, bread, lean meats, yogurt,  fruits, and vegetables.  Avoid high-fat foods.  Drink enough fluids to keep your pee (urine) clear or pale yellow.  Ask your doctor how to replace body fluid losses (rehydrate). Signs of body fluid loss (dehydration) include:  Feeling very thirsty.  Dry lips and mouth.  Feeling dizzy.  Dark pee.  Peeing less than normal.  Feeling confused.  Fast breathing or heart rate. GET HELP RIGHT AWAY IF:   You have blood in your throw up.  You have black or bloody poop (stool).  You have a bad headache or stiff neck.  You feel confused.  You have bad belly (abdominal) pain.  You have chest pain or trouble breathing.  You do not pee at least once every 8 hours.  You have cold, clammy skin.  You keep throwing up after 24 to 48 hours.  You have a fever. MAKE SURE YOU:   Understand these instructions.  Will watch your condition.  Will get help right away if you are not doing well or get worse.   This information is not intended to replace advice  given to you by your health care provider. Make sure you discuss any questions you have with your health care provider.   Document Released: 12/10/2007 Document Revised: 09/15/2011 Document Reviewed: 11/22/2010 Elsevier Interactive Patient Education 2016 [employer].  Food Poisoning Food poisoning is an illness caused by something you ate or drank. It usually lasts 1 to 2 days. Problems may be worse for people with low immune systems, the elderly, children and infants, and pregnant women. HOME CARE  Drink enough water and fluids to keep your pee (urine) clear or pale yellow. Drink small amounts often.  Ask your doctor how to replace body fluid losses (rehydration).  Avoid:  Foods high in sugar.  Alcohol.  Bubbly (carbonated) drinks.  Tobacco.  Juice.  Caffeine drinks.  Very hot or cold fluids.  Fatty, greasy foods.  Eating too much at one time.  Dairy products until 24 to 48 hours after watery poop (diarrhea) stops.  You may eat foods with active cultures (probiotics). They can be found in some yogurts and supplements.  Wash your hands well to avoid spreading the illness.  Only take medicines as told by your doctor. Do not give aspirin to children.  Ask your doctor if you should keep taking your regular medicines. GET HELP RIGHT AWAY IF:   You have trouble breathing, swallowing, talking, or moving.  You have blurry vision.  You cannot keep fluids down.  You pass out (faint) or almost pass out.  Your eyes turn yellow.  You keep throwing up (vomiting) or having watery poop.  Belly (abdominal) pain starts, gets worse, or is just in one small spot (localizes).  You have a fever.  Your watery poop has blood in it.  You feel very weak, dizzy, or thirsty.  You do not pee for 8 hours. MAKE SURE YOU:   Understand these instructions.  Will watch your condition.  Will get help right away if you are not doing well or get worse.   This information is not  intended to replace advice given to you by your health care provider. Make sure you discuss any questions you have with your health care provider.   Document Released: 12/11/2009 Document Revised: 09/15/2011 Document Reviewed: 12/25/2014 Elsevier Interactive Patient Education 2016 Elsevier Inc.  Diarrhea Diarrhea is watery poop (stool). It can make you feel weak, tired, thirsty, or give you a dry mouth (signs of dehydration).  Watery poop is a sign of another problem, most often an infection. It often lasts 2-3 days. It can last longer if it is a sign of something serious. Take care of yourself as told by your doctor. HOME CARE   Drink 1 cup (8 ounces) of fluid each time you have watery poop.  Do not drink the following fluids:  Those that contain simple sugars (fructose, glucose, galactose, lactose, sucrose, maltose).  Sports drinks.  Fruit juices.  Whole milk products.  Sodas.  Drinks with caffeine (coffee, tea, soda) or alcohol.  Oral rehydration solution may be used if the doctor says it is okay. You may make your own solution. Follow this recipe:   - teaspoon table salt.   teaspoon baking soda.   teaspoon salt substitute containing potassium chloride.  1 tablespoons sugar.  1 liter (34 ounces) of water.  Avoid the following foods:  High fiber foods, such as raw fruits and vegetables.  Nuts, seeds, and whole grain breads and cereals.   Those that are sweetened with sugar alcohols (xylitol, sorbitol, mannitol).  Try eating the following foods:  Starchy foods, such as rice, toast, pasta, low-sugar cereal, oatmeal, baked potatoes, crackers, and bagels.  Bananas.  Applesauce.  Eat probiotic-rich foods, such as yogurt and milk products that are fermented.  Wash your hands well after each time you have watery poop.  Only take medicine as told by your doctor.  Take a warm bath to help lessen burning or pain from having watery poop. GET HELP RIGHT AWAY IF:    You cannot drink fluids without throwing up (vomiting).  You keep throwing up.  You have blood in your poop, or your poop looks black and tarry.  You do not pee (urinate) in 6-8 hours, or there is only a small amount of very dark pee.  You have belly (abdominal) pain that gets worse or stays in the same spot (localizes).  You are weak, dizzy, confused, or light-headed.  You have a very bad headache.  Your watery poop gets worse or does not get better.  You have a fever or lasting symptoms for more than 2-3 days.  You have a fever and your symptoms suddenly get worse. MAKE SURE YOU:   Understand these instructions.  Will watch your condition.  Will get help right away if you are not doing well or get worse.   This information is not intended to replace advice given to you by your health care provider. Make sure you discuss any questions you have with your health care provider.   Document Released: 12/10/2007 Document Revised: 07/14/2014 Document Reviewed: 02/29/2012 Elsevier Interactive Patient Education [employer] Elsevier Inc.

## 2015-11-10 NOTE — ED Notes (Signed)
Pt reports abd pain with n/v x 3 days.  Has chronic n/v x 6 years without dx per family at bedside.  She also reports pt has been losing weight.

## 2015-11-10 NOTE — ED Notes (Signed)
Per pt, states vomiting and diarrhea for a couple of days-vomit tastes and smells like his diarrhea

## 2015-11-10 NOTE — ED Notes (Signed)
Pt reports vomiting the PO phenergan.  France RavensMercedes, EDPA notified.

## 2015-11-10 NOTE — ED Notes (Signed)
Pt asleep when this nurse walked-in the room.  Pt woke up, states pain is a 9/10, but reports nausea is gone.  Will hold phenergan supp at this time.

## 2015-11-11 ENCOUNTER — Encounter: Payer: Self-pay | Admitting: Family Medicine

## 2015-11-11 DIAGNOSIS — K2970 Gastritis, unspecified, without bleeding: Secondary | ICD-10-CM

## 2015-11-11 DIAGNOSIS — K209 Esophagitis, unspecified without bleeding: Secondary | ICD-10-CM

## 2015-11-12 ENCOUNTER — Encounter (HOSPITAL_COMMUNITY): Payer: Self-pay | Admitting: *Deleted

## 2015-11-12 ENCOUNTER — Emergency Department (HOSPITAL_COMMUNITY): Payer: No Typology Code available for payment source

## 2015-11-12 ENCOUNTER — Emergency Department (HOSPITAL_COMMUNITY)
Admission: EM | Admit: 2015-11-12 | Discharge: 2015-11-12 | Disposition: A | Discharge status: Home / Self Care | Payer: No Typology Code available for payment source | Attending: Emergency Medicine | Admitting: Emergency Medicine

## 2015-11-12 ENCOUNTER — Ambulatory Visit: Payer: Self-pay

## 2015-11-12 DIAGNOSIS — Z791 Long term (current) use of non-steroidal anti-inflammatories (NSAID): Secondary | ICD-10-CM | POA: Insufficient documentation

## 2015-11-12 DIAGNOSIS — G8929 Other chronic pain: Secondary | ICD-10-CM | POA: Insufficient documentation

## 2015-11-12 DIAGNOSIS — R109 Unspecified abdominal pain: Secondary | ICD-10-CM | POA: Insufficient documentation

## 2015-11-12 DIAGNOSIS — Z8679 Personal history of other diseases of the circulatory system: Secondary | ICD-10-CM | POA: Insufficient documentation

## 2015-11-12 DIAGNOSIS — Z862 Personal history of diseases of the blood and blood-forming organs and certain disorders involving the immune mechanism: Secondary | ICD-10-CM | POA: Insufficient documentation

## 2015-11-12 DIAGNOSIS — F410 Panic disorder [episodic paroxysmal anxiety] without agoraphobia: Secondary | ICD-10-CM | POA: Insufficient documentation

## 2015-11-12 DIAGNOSIS — Z79899 Other long term (current) drug therapy: Secondary | ICD-10-CM | POA: Insufficient documentation

## 2015-11-12 DIAGNOSIS — R079 Chest pain, unspecified: Secondary | ICD-10-CM | POA: Insufficient documentation

## 2015-11-12 DIAGNOSIS — F319 Bipolar disorder, unspecified: Secondary | ICD-10-CM | POA: Insufficient documentation

## 2015-11-12 DIAGNOSIS — R112 Nausea with vomiting, unspecified: Secondary | ICD-10-CM | POA: Insufficient documentation

## 2015-11-12 DIAGNOSIS — Z8719 Personal history of other diseases of the digestive system: Secondary | ICD-10-CM | POA: Insufficient documentation

## 2015-11-12 LAB — BASIC METABOLIC PANEL
Anion gap: 8 (ref 5–15)
BUN: 11 mg/dL (ref 6–20)
CALCIUM: 8.8 mg/dL — AB (ref 8.9–10.3)
CHLORIDE: 106 mmol/L (ref 101–111)
CO2: 25 mmol/L (ref 22–32)
CREATININE: 0.84 mg/dL (ref 0.61–1.24)
GFR calc Af Amer: >60 mL/min (ref >60)
Glucose, Bld: 101 mg/dL — ABNORMAL HIGH (ref 65–99)
Potassium: 3.6 mmol/L (ref 3.5–5.1)
SODIUM: 139 mmol/L (ref 135–145)

## 2015-11-12 LAB — CBC
HCT: 34.3 % — ABNORMAL LOW (ref 39.0–52.0)
Hemoglobin: 10.7 g/dL — ABNORMAL LOW (ref 13.0–17.0)
MCH: 23.8 pg — ABNORMAL LOW (ref 26.0–34.0)
MCHC: 31.2 g/dL (ref 30.0–36.0)
MCV: 76.2 fL — AB (ref 78.0–100.0)
PLATELETS: 280 10*3/uL (ref 150–400)
RBC: 4.5 MIL/uL (ref 4.22–5.81)
RDW: 17.6 % — AB (ref 11.5–15.5)
WBC: 8.1 10*3/uL (ref 4.0–10.5)

## 2015-11-12 LAB — I-STAT TROPONIN, ED: TROPONIN I, POC: 0 ng/mL (ref 0.00–0.08)

## 2015-11-12 MED ORDER — BENZONATATE 100 MG PO CAPS
100.0000 mg | ORAL_CAPSULE | Freq: Once | ORAL | Status: AC
  Administered 2015-11-12: 100 mg via ORAL
  Filled 2015-11-12: qty 1

## 2015-11-12 MED ORDER — ACETAMINOPHEN-CODEINE #3 300-30 MG PO TABS
1.0000 | ORAL_TABLET | Freq: Once | ORAL | Status: AC
  Administered 2015-11-12: 1 via ORAL
  Filled 2015-11-12: qty 1

## 2015-11-12 MED ORDER — ONDANSETRON HCL 4 MG/2ML IJ SOLN: 4.0000 mg | Freq: Once | INTRAMUSCULAR | Status: DC

## 2015-11-12 MED ORDER — PROMETHAZINE HCL 25 MG RE SUPP
25.0000 mg | Freq: Once | RECTAL | Status: AC
  Administered 2015-11-12: 25 mg via RECTAL
  Filled 2015-11-12: qty 1

## 2015-11-12 MED ORDER — DICYCLOMINE HCL 10 MG/ML IM SOLN
20.0000 mg | Freq: Once | INTRAMUSCULAR | Status: DC
  Filled 2015-11-12: qty 2

## 2015-11-12 MED ORDER — SUCRALFATE 1 GM/10ML PO SUSP: 1.0000 g | Freq: Three times a day (TID) | ORAL | Status: DC

## 2015-11-12 MED ORDER — ONDANSETRON 4 MG PO TBDP
4.0000 mg | ORAL_TABLET | Freq: Once | ORAL | Status: AC
  Administered 2015-11-12: 4 mg via ORAL
  Filled 2015-11-12: qty 1

## 2015-11-12 MED ORDER — GI COCKTAIL ~~LOC~~
30.0000 mL | Freq: Once | ORAL | Status: AC
  Administered 2015-11-12: 30 mL via ORAL
  Filled 2015-11-12: qty 30

## 2015-11-12 MED FILL — ?LINZESS 290 MCG CAPSULE: 290 | 30 days supply | Qty: 30 | Fill #0

## 2015-11-12 NOTE — Addendum Note (Signed)
Addended by: Dessa PhiFUNCHES, Breanah Faddis on: 11/12/2015 05:35 PM   Modules accepted: Orders

## 2015-11-12 NOTE — ED Notes (Signed)
Pt c/o R sided chest pain since Friday with NV. Reports being seen on Saturday for NVD, now having constipation. Also c/o shortness of breath.

## 2015-11-12 NOTE — ED Notes (Signed)
Pt ambulated from room to lobby. Refused to wait for d/c paperwork as well as discharge vitals. Pt ambulated without difficulty, no distress noted.

## 2015-11-12 NOTE — ED Provider Notes (Signed)
CSN: [id number]     Arrival date & time 11/12/15  [id number] History   First MD Initiated Contact with Patient 11/12/15 0701     Chief Complaint  Patient presents with  . Chest Pain     (Consider location/radiation/quality/duration/timing/severity/associated sxs/prior Treatment) Patient is a 30 y.o. male presenting with chest pain.  Chest Pain Pain location:  R chest and substernal area Pain quality: sharp   Pain radiates to:  Does not radiate Pain radiates to the back: no   Pain severity:  Severe Onset quality:  Sudden Duration:  4 days Timing:  Constant Progression:  Worsening Chronicity:  New (once and needed gb removed) Relieved by:  Nothing Exacerbated by: drinking. Ineffective treatments: tums, over the counter mucinex, cough suppresants, PCP gave antifungal  last week then called and said don't take hadnt taken it yet. Associated symptoms: abdominal pain (always have stomach pains"), cough (mucous, yellow-green), nausea, shortness of breath and vomiting   Associated symptoms: no back pain, no fever and no headache   Risk factors: no coronary artery disease, no diabetes mellitus, no high cholesterol, no hypertension, no prior DVT/PE and no smoking   Risk factors comment:  No recent travel/surgeries/fam hx of cad   Past Medical History  Diagnosis Date  . Medical history non-contributory   . Anxiety   . Panic attack as reaction to stress   . Anemia   . Depression   . Gallstones   . Dysrhythmia     irregular heart rate - due to zoloft  . Bipolar disorder (HCC)   . Cirrhosis (HCC)   . Constipation    Past Surgical History  Procedure Laterality Date  . Cholecystectomy    . Tonsillectomy    . Incise and drain abcess    . Mouth surgery    . Esophagogastroduodenoscopy N/A 05/22/2015    Procedure: ESOPHAGOGASTRODUODENOSCOPY (EGD);  Surgeon: Vida RiggerMarc Magod, MD;  Location: [facility] ENDOSCOPY;  Service: Endoscopy;  Laterality: N/A;   Family History  Problem Relation Age of Onset  .  Irritable bowel syndrome Mother   . Kidney disease Mother   . Ulcerative colitis Maternal Grandfather   . Colon cancer Neg Hx   . Colon polyps Neg Hx   . Gallbladder disease Neg Hx   . Esophageal cancer Neg Hx   . Diabetes Neg Hx    Social History  Substance Use Topics  . Smoking status: Never Smoker   . Smokeless tobacco: Never Used  . Alcohol Use: No    Review of Systems  Constitutional: Negative for fever.  HENT: Negative for sore throat.   Eyes: Negative for visual disturbance.  Respiratory: Positive for cough (mucous, yellow-green) and shortness of breath.   Cardiovascular: Positive for chest pain.  Gastrointestinal: Positive for nausea, vomiting, abdominal pain (always have stomach pains") and constipation. Negative for diarrhea (did have but now constipated).  Genitourinary: Negative for difficulty urinating.  Musculoskeletal: Negative for back pain and neck stiffness.  Skin: Negative for rash.  Neurological: Negative for syncope and headaches.      Allergies  Reglan; Toradol; Tramadol; and Zoloft  Home Medications   Prior to Admission medications   Medication Sig Start Date End Date Taking? Authorizing Provider  acetaminophen-codeine (TYLENOL #3) 300-30 MG tablet Take 1 tablet by mouth every 6 (six) hours as needed for moderate pain. 10/16/15  Yes Josalyn Funches, MD  calcium carbonate (OS-CAL) 1250 (500 Ca) MG chewable tablet Chew 1 tablet by mouth daily.   Yes Historical Provider, MD  clonazePAM (  KLONOPIN) 1 MG tablet Take 1 tablet (1 mg total) by mouth 2 (two) times daily. 11/01/15  Yes Josalyn Funches, MD  diphenhydrAMINE (BENADRYL) 25 mg capsule Take 50 mg by mouth at bedtime as needed for sleep.   Yes Historical Provider, MD  ibuprofen (ADVIL,MOTRIN) 200 MG tablet Take 400-800 mg by mouth every 6 (six) hours as needed for moderate pain.    Yes Historical Provider, MD  LINZESS 290 MCG CAPS capsule Take 290 mcg by mouth daily. 07/16/15  Yes Historical Provider, MD   naproxen (NAPROSYN) 250 MG tablet Take 1 tablet (250 mg total) by mouth 2 (two) times daily with a meal. 10/10/15  Yes Everlene Farrier, PA-C  Vitamin D, Ergocalciferol, (DRISDOL) 50000 units CAPS capsule Take 1 capsule (50,000 Units total) by mouth every 7 (seven) days. For 8 weeks 09/20/15  Yes Josalyn Funches, MD  pregabalin (LYRICA) 50 MG capsule Take 1 capsule (50 mg total) by mouth 3 (three) times daily. 09/20/15   Dessa Phi, MD  prochlorperazine (COMPAZINE) 10 MG tablet Take 1 tablet (10 mg total) by mouth 2 (two) times daily as needed for nausea or vomiting. 11/10/15   Mercedes Camprubi-Soms, PA-C  promethazine (PHENERGAN) 25 MG suppository Place 1 suppository (25 mg total) rectally every 6 (six) hours as needed for nausea, vomiting or refractory nausea / vomiting. 11/10/15   Mercedes Camprubi-Soms, PA-C  promethazine (PHENERGAN) 25 MG tablet Take 1 tablet (25 mg total) by mouth every 8 (eight) hours as needed for nausea or vomiting. 10/16/15   Dessa Phi, MD  ranitidine (ZANTAC) 150 MG tablet Take 1 tablet (150 mg total) by mouth 2 (two) times daily. 11/10/15   Mercedes Camprubi-Soms, PA-C  sucralfate (CARAFATE) 1 GM/10ML suspension Take 10 mLs (1 g total) by mouth 4 (four) times daily -  with meals and at bedtime. 11/12/15   Josalyn Funches, MD   BP 122/80 mmHg  Pulse 75  Temp(Src) 98 F (36.7 C) (Oral)  Resp 19  SpO2 100% Physical Exam  Constitutional: He is oriented to person, place, and time. He appears well-developed and well-nourished. He appears distressed (upset).  HENT:  Head: Normocephalic and atraumatic.  Eyes: Conjunctivae and EOM are normal.  Neck: Normal range of motion.  Cardiovascular: Normal rate, regular rhythm, normal heart sounds and intact distal pulses.  Exam reveals no gallop and no friction rub.   No murmur heard. Pulmonary/Chest: Effort normal and breath sounds normal. No respiratory distress. He has no wheezes. He has no rales.  Abdominal: Soft. He exhibits no  distension. There is tenderness (diffuse). There is no guarding.  Musculoskeletal: He exhibits no edema.  Neurological: He is alert and oriented to person, place, and time.  Skin: Skin is warm and dry. He is not diaphoretic.  Psychiatric: His mood appears anxious.  Nursing note and vitals reviewed.   ED Course  Procedures (including critical care time) Labs Review Labs Reviewed  BASIC METABOLIC PANEL - Abnormal; Notable for the following:    Glucose, Bld 101 (*)    Calcium 8.8 (*)    All other components within normal limits  CBC - Abnormal; Notable for the following:    Hemoglobin 10.7 (*)    HCT 34.3 (*)    MCV 76.2 (*)    MCH 23.8 (*)    RDW 17.6 (*)    All other components within normal limits  Rosezena Sensor, ED    Imaging Review Dg Chest 2 View  11/12/2015  CLINICAL DATA:  Cough. Upper right chest  pain. Worse with taking deep breaths. Nausea and vomiting today. EXAM: CHEST - 2 VIEW COMPARISON:  None. FINDINGS: The heart size and mediastinal contours are within normal limits. Both lungs are clear. The visualized skeletal structures are unremarkable. IMPRESSION: Negative two view chest x-ray Electronically Signed   By: Marin Roberts M.D.   On: 11/12/2015 07:28   I have personally reviewed and evaluated these images and lab results as part of my medical decision-making.   EKG Interpretation   Date/Time:  Monday Nov 12 2015 06:49:53 EDT Ventricular Rate:  72 PR Interval:  140 QRS Duration: 111 QT Interval:  372 QTC Calculation: 407 R Axis:   84 Text Interpretation:  Sinus rhythm RSR' in V1 or V2, right VCD or RVH  Baseline wander in lead(s) V2 V4 No significant change since last tracing  Confirmed by [facility] MD, Lasya Vetter ([id number]) on 11/12/2015 7:08:01 AM      MDM   Final diagnoses:  Chest pain, unspecified chest pain type  Chronic abdominal pain  Non-intractable vomiting with nausea, vomiting of unspecified type   30 year old male with a history of  anxiety, depression, panic attacks, bipolar, cholecystectomy, esophageal candidiasis secondary to antibiotic administration per patient, pneumomediastinum, frequent visits to the emergency department (16 over 6 months,) chronic pain, presents with concern for chest pain.  EKG was a vitamin E and shows no acute changes. Troponin is negative. Pain has been constant per patient, and have low suspicion for ACS. Patient is low risk hear score. She is perc negative, low risk Wells and doubt PE.  X-ray shows no sign of pneumothorax and no XR findings of pneumomediastinum and have low suspicion for these. Doubt esophageal candidiasis given cough, no recent abx or risk factors.  Pt likely with viral cough, CP, and will be given rx for tessalon.   While waiting for results, reports he has worsening abdominal pain. Has hx of chronic abdominal pain. Exam nonfocal, doubt acute appendicitis, diverticulitis or nephrolithiasis. Feel this is exacerbation of patient's chronic pain. Explained to patient that while I do not feel he has an emergent diagnosis, I recommend he continue following closely with gastroenterology.  I also discussed that I do not feel narcotic medications are appropriate for his pain.  Wrote for phenergan suppository and bentyl IM, however pt declined bentyl.   Discussed that would give zofran rx and bentyl rx for pain, but patient did not want to wait for his paperwork prior to discharge.    Alvira Monday, MD 11/12/15 [phone]

## 2015-11-13 ENCOUNTER — Emergency Department (HOSPITAL_COMMUNITY): Payer: No Typology Code available for payment source

## 2015-11-13 ENCOUNTER — Encounter: Payer: Self-pay | Admitting: Family Medicine

## 2015-11-13 ENCOUNTER — Telehealth: Payer: Self-pay | Admitting: *Deleted

## 2015-11-13 ENCOUNTER — Emergency Department (HOSPITAL_COMMUNITY)
Admission: EM | Admit: 2015-11-13 | Discharge: 2015-11-13 | Disposition: A | Discharge status: Home / Self Care | Payer: No Typology Code available for payment source | Attending: Emergency Medicine | Admitting: Emergency Medicine

## 2015-11-13 ENCOUNTER — Encounter (HOSPITAL_COMMUNITY): Payer: Self-pay | Admitting: Emergency Medicine

## 2015-11-13 DIAGNOSIS — Z79899 Other long term (current) drug therapy: Secondary | ICD-10-CM | POA: Insufficient documentation

## 2015-11-13 DIAGNOSIS — M542 Cervicalgia: Secondary | ICD-10-CM

## 2015-11-13 DIAGNOSIS — F319 Bipolar disorder, unspecified: Secondary | ICD-10-CM | POA: Insufficient documentation

## 2015-11-13 DIAGNOSIS — R531 Weakness: Secondary | ICD-10-CM | POA: Insufficient documentation

## 2015-11-13 DIAGNOSIS — Z862 Personal history of diseases of the blood and blood-forming organs and certain disorders involving the immune mechanism: Secondary | ICD-10-CM | POA: Insufficient documentation

## 2015-11-13 DIAGNOSIS — F410 Panic disorder [episodic paroxysmal anxiety] without agoraphobia: Secondary | ICD-10-CM | POA: Insufficient documentation

## 2015-11-13 DIAGNOSIS — K5900 Constipation, unspecified: Secondary | ICD-10-CM | POA: Insufficient documentation

## 2015-11-13 DIAGNOSIS — I499 Cardiac arrhythmia, unspecified: Secondary | ICD-10-CM | POA: Insufficient documentation

## 2015-11-13 LAB — CBC WITH DIFFERENTIAL/PLATELET
Basophils Absolute: 0 10*3/uL (ref 0.0–0.1)
Basophils Relative: 0 %
EOS ABS: 0.1 10*3/uL (ref 0.0–0.7)
EOS PCT: 1 %
HCT: 33.9 % — ABNORMAL LOW (ref 39.0–52.0)
Hemoglobin: 11.2 g/dL — ABNORMAL LOW (ref 13.0–17.0)
LYMPHS ABS: 2.2 10*3/uL (ref 0.7–4.0)
Lymphocytes Relative: 28 %
MCH: 24.4 pg — ABNORMAL LOW (ref 26.0–34.0)
MCHC: 33 g/dL (ref 30.0–36.0)
MCV: 73.9 fL — AB (ref 78.0–100.0)
MONO ABS: 0.5 10*3/uL (ref 0.1–1.0)
Monocytes Relative: 6 %
Neutro Abs: 5.2 10*3/uL (ref 1.7–7.7)
Neutrophils Relative %: 65 %
PLATELETS: 317 10*3/uL (ref 150–400)
RBC: 4.59 MIL/uL (ref 4.22–5.81)
RDW: 17.1 % — AB (ref 11.5–15.5)
WBC: 8 10*3/uL (ref 4.0–10.5)

## 2015-11-13 LAB — COMPREHENSIVE METABOLIC PANEL
ALT: 7 U/L — AB (ref 17–63)
ANION GAP: 10 (ref 5–15)
AST: 13 U/L — ABNORMAL LOW (ref 15–41)
Albumin: 4 g/dL (ref 3.5–5.0)
Alkaline Phosphatase: 52 U/L (ref 38–126)
BUN: 13 mg/dL (ref 6–20)
CHLORIDE: 104 mmol/L (ref 101–111)
CO2: 25 mmol/L (ref 22–32)
Calcium: 9.1 mg/dL (ref 8.9–10.3)
Creatinine, Ser: 0.81 mg/dL (ref 0.61–1.24)
Glucose, Bld: 91 mg/dL (ref 65–99)
POTASSIUM: 3.6 mmol/L (ref 3.5–5.1)
SODIUM: 139 mmol/L (ref 135–145)
Total Bilirubin: 0.4 mg/dL (ref 0.3–1.2)
Total Protein: 6.8 g/dL (ref 6.5–8.1)

## 2015-11-13 MED ORDER — HYDROMORPHONE HCL 1 MG/ML IJ SOLN
1.0000 mg | Freq: Once | INTRAMUSCULAR | Status: AC
  Administered 2015-11-13: 1 mg via INTRAVENOUS
  Filled 2015-11-13: qty 1

## 2015-11-13 MED ORDER — PREDNISONE 20 MG PO TABS: 40.0000 mg | ORAL_TABLET | Freq: Every day | ORAL | Status: DC

## 2015-11-13 MED ORDER — LORAZEPAM 2 MG/ML IJ SOLN
1.0000 mg | Freq: Once | INTRAMUSCULAR | Status: AC
  Administered 2015-11-13: 1 mg via INTRAVENOUS
  Filled 2015-11-13: qty 1

## 2015-11-13 MED ORDER — HYDROMORPHONE HCL 4 MG PO TABS: 4.0000 mg | ORAL_TABLET | Freq: Four times a day (QID) | ORAL | Status: DC | PRN

## 2015-11-13 MED ORDER — METHYLPREDNISOLONE SODIUM SUCC 125 MG IJ SOLR
125.0000 mg | Freq: Once | INTRAMUSCULAR | Status: AC
  Administered 2015-11-13: 125 mg via INTRAVENOUS
  Filled 2015-11-13: qty 2

## 2015-11-13 MED FILL — predniSONE 20 MG TABS: 20 | 5 days supply | Qty: 10 | Fill #0

## 2015-11-13 MED FILL — CARAFATE 1 GM/10 ML SUSP: 1 | 10 days supply | Qty: 420 | Fill #0

## 2015-11-13 NOTE — ED Notes (Signed)
Bed: WA17 Expected date:  Expected time:  Means of arrival:  Comments: EMS 

## 2015-11-13 NOTE — ED Notes (Signed)
RN to draw labs. 

## 2015-11-13 NOTE — Telephone Encounter (Signed)
If this is the same patient we discussed earlier, please begin the process of termination of physician - patient relationship. There is no point keeping on if we are not able to address his need to his satisfaction. He needs to find another practitioner who will satisfy his needs.

## 2015-11-13 NOTE — ED Notes (Signed)
Out of room.  

## 2015-11-13 NOTE — Discharge Instructions (Signed)
Follow up with your md this week.  Get the prednisone tomorrow

## 2015-11-13 NOTE — ED Provider Notes (Signed)
CSN: [id number]     Arrival date & time 11/13/15  1827 History   First MD Initiated Contact with Patient 11/13/15 1913     Chief Complaint  Patient presents with  . Neck Pain      (Consider location/radiation/quality/duration/timing/severity/associated sxs/prior Treatment) Patient is a 30 y.o. male presenting with weakness. The history is provided by the patient (Patient complains of severe pain to the back with neck which came on all of a sudden patient has had no fever cough or sore throat. Patient has a history of cervical disc).  Weakness Chronicity: Patient with neck pain that started today. The current episode started less than 1 hour ago. The problem occurs constantly. The problem has not changed since onset.Pertinent negatives include no chest pain, no abdominal pain and no headaches. Exacerbated by: Movement.    Past Medical History  Diagnosis Date  . Medical history non-contributory   . Anxiety   . Panic attack as reaction to stress   . Anemia   . Depression   . Gallstones   . Dysrhythmia     irregular heart rate - due to zoloft  . Bipolar disorder (HCC)   . Cirrhosis (HCC)   . Constipation    Past Surgical History  Procedure Laterality Date  . Cholecystectomy    . Tonsillectomy    . Incise and drain abcess    . Mouth surgery    . Esophagogastroduodenoscopy N/A 05/22/2015    Procedure: ESOPHAGOGASTRODUODENOSCOPY (EGD);  Surgeon: Vida Rigger, MD;  Location: [facility] ENDOSCOPY;  Service: Endoscopy;  Laterality: N/A;   Family History  Problem Relation Age of Onset  . Irritable bowel syndrome Mother   . Kidney disease Mother   . Ulcerative colitis Maternal Grandfather   . Colon cancer Neg Hx   . Colon polyps Neg Hx   . Gallbladder disease Neg Hx   . Esophageal cancer Neg Hx   . Diabetes Neg Hx    Social History  Substance Use Topics  . Smoking status: Never Smoker   . Smokeless tobacco: Never Used  . Alcohol Use: No    Review of Systems  Constitutional: Negative  for appetite change and fatigue.  HENT: Negative for congestion, ear discharge and sinus pressure.        Neck pain  Eyes: Negative for discharge.  Respiratory: Negative for cough.   Cardiovascular: Negative for chest pain.  Gastrointestinal: Negative for abdominal pain and diarrhea.  Genitourinary: Negative for frequency and hematuria.  Musculoskeletal: Negative for back pain.  Skin: Negative for rash.  Neurological: Positive for weakness. Negative for seizures and headaches.  Psychiatric/Behavioral: Negative for hallucinations.      Allergies  Reglan; Toradol; Tramadol; and Zoloft  Home Medications   Prior to Admission medications   Medication Sig Start Date End Date Taking? Authorizing Provider  acetaminophen-codeine (TYLENOL #3) 300-30 MG tablet Take 1 tablet by mouth every 6 (six) hours as needed for moderate pain. 10/16/15  Yes Josalyn Funches, MD  clonazePAM (KLONOPIN) 1 MG tablet Take 1 tablet (1 mg total) by mouth 2 (two) times daily. 11/01/15  Yes Josalyn Funches, MD  diphenhydrAMINE (BENADRYL) 25 mg capsule Take 50 mg by mouth at bedtime as needed for sleep.   Yes Historical Provider, MD  ibuprofen (ADVIL,MOTRIN) 200 MG tablet Take 400-800 mg by mouth every 6 (six) hours as needed for moderate pain.    Yes Historical Provider, MD  LINZESS 290 MCG CAPS capsule Take 290 mcg by mouth daily. 07/16/15  Yes Historical Provider,  MD  predniSONE (DELTASONE) 20 MG tablet Take 2 tablets (40 mg total) by mouth daily with breakfast. 11/13/15  Yes Josalyn Funches, MD  pregabalin (LYRICA) 50 MG capsule Take 1 capsule (50 mg total) by mouth 3 (three) times daily. 09/20/15  Yes Dessa Phi, MD  promethazine (PHENERGAN) 25 MG tablet Take 1 tablet (25 mg total) by mouth every 8 (eight) hours as needed for nausea or vomiting. 10/16/15  Yes Josalyn Funches, MD  Vitamin D, Ergocalciferol, (DRISDOL) 50000 units CAPS capsule Take 1 capsule (50,000 Units total) by mouth every 7 (seven) days. For 8 weeks  09/20/15  Yes Josalyn Funches, MD  HYDROmorphone (DILAUDID) 4 MG tablet Take 1 tablet (4 mg total) by mouth every 6 (six) hours as needed for moderate pain or severe pain. 11/13/15   Bethann Berkshire, MD  naproxen (NAPROSYN) 250 MG tablet Take 1 tablet (250 mg total) by mouth 2 (two) times daily with a meal. Patient not taking: Reported on 11/13/2015 10/10/15   Everlene Farrier, PA-C  prochlorperazine (COMPAZINE) 10 MG tablet Take 1 tablet (10 mg total) by mouth 2 (two) times daily as needed for nausea or vomiting. Patient not taking: Reported on 11/13/2015 11/10/15   Mercedes Camprubi-Soms, PA-C  promethazine (PHENERGAN) 25 MG suppository Place 1 suppository (25 mg total) rectally every 6 (six) hours as needed for nausea, vomiting or refractory nausea / vomiting. Patient not taking: Reported on 11/13/2015 11/10/15   Mercedes Camprubi-Soms, PA-C  ranitidine (ZANTAC) 150 MG tablet Take 1 tablet (150 mg total) by mouth 2 (two) times daily. Patient not taking: Reported on 11/13/2015 11/10/15   Mercedes Camprubi-Soms, PA-C  sucralfate (CARAFATE) 1 GM/10ML suspension Take 10 mLs (1 g total) by mouth 4 (four) times daily -  with meals and at bedtime. Patient not taking: Reported on 11/13/2015 11/12/15   Josalyn Funches, MD   BP 130/96 mmHg  Pulse 64  Temp(Src) 98 F (36.7 C) (Oral)  Resp 20  Ht 5\' 7"  (1.702 m)  Wt 120 lb (54.432 kg)  BMI 18.79 kg/m2  SpO2 92% Physical Exam  Constitutional: He is oriented to person, place, and time. He appears well-developed.  HENT:  Head: Normocephalic.  Patient is tender mid posterior neck  Eyes: Conjunctivae and EOM are normal. No scleral icterus.  Neck: Neck supple. No thyromegaly present.  Cardiovascular: Normal rate and regular rhythm.  Exam reveals no gallop and no friction rub.   No murmur heard. Pulmonary/Chest: No stridor. He has no wheezes. He has no rales. He exhibits no tenderness.  Abdominal: He exhibits no distension. There is no tenderness. There is no rebound.   Musculoskeletal: Normal range of motion. He exhibits no edema.  Lymphadenopathy:    He has no cervical adenopathy.  Neurological: He is oriented to person, place, and time. He exhibits normal muscle tone. Coordination normal.  Strength and reflexes normal extremities  Skin: No rash noted. No erythema.  Psychiatric: He has a normal mood and affect. His behavior is normal.    ED Course  Procedures (including critical care time) Labs Review Labs Reviewed  CBC WITH DIFFERENTIAL/PLATELET - Abnormal; Notable for the following:    Hemoglobin 11.2 (*)    HCT 33.9 (*)    MCV 73.9 (*)    MCH 24.4 (*)    RDW 17.1 (*)    All other components within normal limits  COMPREHENSIVE METABOLIC PANEL - Abnormal; Notable for the following:    AST 13 (*)    ALT 7 (*)  All other components within normal limits    Imaging Review Dg Chest 2 View  11/12/2015  CLINICAL DATA:  Cough. Upper right chest pain. Worse with taking deep breaths. Nausea and vomiting today. EXAM: CHEST - 2 VIEW COMPARISON:  None. FINDINGS: The heart size and mediastinal contours are within normal limits. Both lungs are clear. The visualized skeletal structures are unremarkable. IMPRESSION: Negative two view chest x-ray Electronically Signed   By: Marin Robertshristopher  Mattern M.D.   On: 11/12/2015 07:28   Mr Cervical Spine Wo Contrast  11/13/2015  CLINICAL DATA:  Neck pain EXAM: MRI CERVICAL SPINE WITHOUT CONTRAST TECHNIQUE: Multiplanar, multisequence MR imaging of the cervical spine was performed. No intravenous contrast was administered. COMPARISON:  Cervical MRI 04/19/2015 FINDINGS: Image quality degraded by mild motion Moderate cervical kyphosis which has progressed since prior study. Normal alignment. No fracture or mass Spinal cord signal normal. No cord lesion. Cervical medullary junction normal. C2-3:  Negative C3-4:  Negative C4-5: Mild disc degeneration and disc bulging unchanged from the prior study. Mild cord flattening ventrally  C5-6: Small right paracentral disc protrusion has progressed. This is causing increased cord deformity on the right with cord flattening. Mild spinal stenosis. Neural foramina adequately patent. C6-7: Interval improvement in right-sided disc protrusion which previously was larger and extending into the foramen. There is now some associated right foraminal narrowing due to uncinate spurring. There is cord flattening and moderate spinal stenosis. C7-T1: Negative IMPRESSION: Small right paracentral disc protrusion at C5-6 has progressed. Increased mass-effect on the right side of the cord with mild spinal stenosis. Improvement in right-sided disc protrusion since the prior study. There remains a small right sided disc protrusion and uncinate spurring on the right causing moderate spinal stenosis and mild right foraminal stenosis. Electronically Signed   By: Marlan Palauharles  Clark M.D.   On: 11/13/2015 21:27   I have personally reviewed and evaluated these images and lab results as part of my medical decision-making.   EKG Interpretation None      MDM   Final diagnoses:  Neck pain    MRI shows increase in the disc protrusion at C5-C6. Suspect this is causing pain. Patient felt better with pain medicines. He will be put on pain medicine steroids and will follow-up with his orthopedic doctor this week    Bethann BerkshireJoseph Mozell Hardacre, MD 11/13/15 2241

## 2015-11-13 NOTE — Telephone Encounter (Signed)
Called patient in response to the following 2 emails Verified name and DOB  Patient first reported that he could not move and was in severe pain. He then stated that he had severe pain from his neck to both elbows but he was able to move his body. He started crying. He stated that the pain started suddenly while he was standing and talking. He has tried robaxin and ibuprofen without relief. He is out of tylenol #3. He is afraid to call EMS or go to ED even though he feels like he is in an emergency situation because he is afraid he will not be treated.   Plan:  Patient advised to consider his situation carefully and if he truly believes he is in an emergency situation, call EMS. He is reminded that this is a clinic, that the allotted response time for communications is 48 hrs and that he cannot always expect and immediate response.   Course of steroids sent to on site pharmacy, patient does not have transportation to come and get the steroids and his girlfriend is busy at work but he also does not have $ to pay for meds if I send them to a closer pharmacy that is open late like his local [facility]. Tylenol #3 will be refilled when it is due on 11/15/2015   Regarding GI complaints: he has called Dr. Marlane Hatcher office and has an appointment on 11/16/14  He is advised to schedule a f/u appointment with me  Patient voiced understanding.   Sent 11/12/2015 at 6:13 PM  "But we haven't gotten my symptoms under any type of control. Everyone is adamant about keeping me on these medications that are having little to no effect. That isn't symptom management at all. Thats a "take this and good luck" at best. This is starting to feel more like its about keeping me as a patient than it is about figuring out why a 30 year old man is withering away to nothing and the only people that seem to care are me and my mother. Please Doc, I dont want to end up dead because I cant get adequate medication due to circumstances that i  have no control over. Please i have to make it all the way to friday with nothing. Im out of everything and im throwing up the colonopins so im not trying to take those. And the hospital dont care, who does? This is needless suffering and this cant keep being ignored. Please youre the only one i trust. "  Sent 11/13/2015 at 1:12 PM  "Why has nobody contacted me yet. I talked to Francena Hanly at 8:15 and told her my neck gave out and I can't move. I was told she would get back to me. I called back at 10 something and was told the same thing. Now your out at lunch while im still stuck in my bed, unable to move, waiting on you guys to call and tell me what i should do. This is serious, and i can't physically move myself. Why have i not been talked to yet about this when it was made know first thing this morning before your doors even opened. Why am i still suffering waiting to see what my doctor thinks i should do cause im terrified to even attempt to go to the emergency room without her telling me its ok. Im scared, and in immense pain and in full blown panic mode and your office is treating this like its a cold  or something with the weather. This isnt right, how can you guys leave me stuck all day like this knowing my situation and that i have no way of treating or transporting myself to a place for treatment?"

## 2015-11-13 NOTE — ED Notes (Addendum)
Per EMS, patient has had neck pain/stiffness since this morning around 05:30. It was sudden onset. Denies recent falls/trauma/injury to this region. Denies recent sickness or fever. Reports that he is not able to move his neck in any direction. Hx of bulging/herniated discs at C 6-7.

## 2015-11-13 NOTE — ED Notes (Signed)
MD at bedside. 

## 2015-11-13 NOTE — ED Notes (Signed)
PT DISCHARGED. INSTRUCTIONS AND PRESCRIPTION GIVEN. AAOX3. PT IN NO APPARENT DISTRESS. THE OPPORTUNITY TO ASK QUESTIONS WAS PROVIDED. 

## 2015-11-13 NOTE — Telephone Encounter (Signed)
Pt wants to talk to Dr Armen PickupFunches assistant . Thank You .

## 2015-11-13 NOTE — Telephone Encounter (Addendum)
Ok. I will start writing the letter of termination.

## 2015-11-13 NOTE — ED Notes (Signed)
Patient transported to MRI 

## 2015-11-14 ENCOUNTER — Telehealth: Payer: Self-pay | Admitting: Family Medicine

## 2015-11-14 ENCOUNTER — Encounter: Payer: Self-pay | Admitting: Family Medicine

## 2015-11-14 DIAGNOSIS — R109 Unspecified abdominal pain: Secondary | ICD-10-CM

## 2015-11-14 DIAGNOSIS — G8929 Other chronic pain: Secondary | ICD-10-CM

## 2015-11-14 DIAGNOSIS — R202 Paresthesia of skin: Secondary | ICD-10-CM

## 2015-11-14 DIAGNOSIS — M5000 Cervical disc disorder with myelopathy, unspecified cervical region: Secondary | ICD-10-CM

## 2015-11-14 DIAGNOSIS — M25562 Pain in left knee: Secondary | ICD-10-CM

## 2015-11-14 DIAGNOSIS — R059 Cough, unspecified: Secondary | ICD-10-CM

## 2015-11-14 DIAGNOSIS — R05 Cough: Secondary | ICD-10-CM

## 2015-11-14 DIAGNOSIS — R1115 Cyclical vomiting syndrome unrelated to migraine: Secondary | ICD-10-CM

## 2015-11-14 MED FILL — PHENADOZ 25 MG SUPP: 25 | 6 days supply | Qty: 24 | Fill #0

## 2015-11-14 NOTE — Telephone Encounter (Signed)
Patient would like to be contacted by the nurse

## 2015-11-15 MED ORDER — ACETAMINOPHEN-CODEINE #3 300-30 MG PO TABS: 1.0000 | ORAL_TABLET | Freq: Four times a day (QID) | ORAL | Status: DC | PRN

## 2015-11-15 MED FILL — ACETAMINOPHEN/COD #3 TABLET: 300-30 | 30 days supply | Qty: 120 | Fill #0

## 2015-11-15 NOTE — Telephone Encounter (Signed)
Tylenol #3 refilled  

## 2015-11-15 NOTE — Telephone Encounter (Signed)
Pt stated unable to take phenergan suppository daily Requesting tables phenergan tables  Stated is hard to used suppository as he is traveling on the bus  Has GI appointment tomorrow and will ask for his phenergan tablets

## 2015-11-15 NOTE — Telephone Encounter (Signed)
Pt requesting Rx tylenol #3 refills

## 2015-11-16 ENCOUNTER — Other Ambulatory Visit: Payer: Self-pay | Admitting: Gastroenterology

## 2015-11-16 DIAGNOSIS — R109 Unspecified abdominal pain: Secondary | ICD-10-CM

## 2015-11-16 DIAGNOSIS — R112 Nausea with vomiting, unspecified: Secondary | ICD-10-CM

## 2015-11-16 DIAGNOSIS — R634 Abnormal weight loss: Secondary | ICD-10-CM

## 2015-11-16 DIAGNOSIS — R933 Abnormal findings on diagnostic imaging of other parts of digestive tract: Secondary | ICD-10-CM

## 2015-11-16 MED ORDER — PROMETHAZINE HCL 25 MG PO TABS: 25.0000 mg | ORAL_TABLET | Freq: Three times a day (TID) | ORAL | Status: DC | PRN

## 2015-11-16 MED ORDER — BENZONATATE 100 MG PO CAPS: 100.0000 mg | ORAL_CAPSULE | Freq: Three times a day (TID) | ORAL | Status: DC | PRN

## 2015-11-16 MED ORDER — ONDANSETRON 8 MG PO TBDP: 8.0000 mg | ORAL_TABLET | Freq: Three times a day (TID) | ORAL | Status: DC | PRN

## 2015-11-16 MED FILL — ONDANSETRON ODT 8 MG TABLET: 8 | 30 days supply | Qty: 60 | Fill #0

## 2015-11-16 MED FILL — BENZONATATE 100 MG CAPSULE: 100 | 5 days supply | Qty: 20 | Fill #0

## 2015-11-16 NOTE — Telephone Encounter (Signed)
Pt notified Rx send to [facility] pharmacy  

## 2015-11-16 NOTE — Telephone Encounter (Signed)
Tessalon perles ordered  for cough Oral phenergan replaced with ODT zofran which will be easier to take and avoids the risk of phenergan overdose  Continue per rectal phenergan when unable to to keep down oral medications

## 2015-11-19 ENCOUNTER — Ambulatory Visit: Payer: No Typology Code available for payment source | Attending: Family Medicine

## 2015-11-19 ENCOUNTER — Ambulatory Visit: Payer: Self-pay | Admitting: Sports Medicine

## 2015-11-19 ENCOUNTER — Encounter: Payer: Self-pay | Admitting: Family Medicine

## 2015-11-19 DIAGNOSIS — M5000 Cervical disc disorder with myelopathy, unspecified cervical region: Secondary | ICD-10-CM | POA: Insufficient documentation

## 2015-11-19 NOTE — Addendum Note (Signed)
Addended by: Dessa PhiFUNCHES, Montanna Mcbain on: 11/19/2015 03:52 PM   Modules accepted: Orders, Medications

## 2015-11-23 ENCOUNTER — Ambulatory Visit
Admission: RE | Admit: 2015-11-23 | Discharge: 2015-11-23 | Disposition: A | Discharge status: Home / Self Care | Payer: No Typology Code available for payment source | Source: Ambulatory Visit | Attending: Gastroenterology | Admitting: Gastroenterology

## 2015-11-23 DIAGNOSIS — R109 Unspecified abdominal pain: Secondary | ICD-10-CM

## 2015-11-23 DIAGNOSIS — R112 Nausea with vomiting, unspecified: Secondary | ICD-10-CM

## 2015-11-23 DIAGNOSIS — R933 Abnormal findings on diagnostic imaging of other parts of digestive tract: Secondary | ICD-10-CM

## 2015-11-23 DIAGNOSIS — R634 Abnormal weight loss: Secondary | ICD-10-CM

## 2015-11-23 MED ORDER — IOPAMIDOL (ISOVUE-300) INJECTION 61%
100.0000 mL | Freq: Once | INTRAVENOUS | Status: AC | PRN
  Administered 2015-11-23: 100 mL via INTRAVENOUS

## 2015-11-26 ENCOUNTER — Encounter (HOSPITAL_COMMUNITY): Payer: Self-pay | Admitting: Emergency Medicine

## 2015-11-26 ENCOUNTER — Emergency Department (HOSPITAL_COMMUNITY)
Admission: EM | Admit: 2015-11-26 | Discharge: 2015-11-27 | Disposition: A | Discharge status: Home / Self Care | Payer: Self-pay | Attending: Emergency Medicine | Admitting: Emergency Medicine

## 2015-11-26 ENCOUNTER — Emergency Department (HOSPITAL_COMMUNITY): Payer: Self-pay

## 2015-11-26 DIAGNOSIS — Z791 Long term (current) use of non-steroidal anti-inflammatories (NSAID): Secondary | ICD-10-CM | POA: Insufficient documentation

## 2015-11-26 DIAGNOSIS — Z7952 Long term (current) use of systemic steroids: Secondary | ICD-10-CM | POA: Insufficient documentation

## 2015-11-26 DIAGNOSIS — F329 Major depressive disorder, single episode, unspecified: Secondary | ICD-10-CM | POA: Insufficient documentation

## 2015-11-26 DIAGNOSIS — R1084 Generalized abdominal pain: Secondary | ICD-10-CM | POA: Insufficient documentation

## 2015-11-26 DIAGNOSIS — Z79899 Other long term (current) drug therapy: Secondary | ICD-10-CM | POA: Insufficient documentation

## 2015-11-26 DIAGNOSIS — E860 Dehydration: Secondary | ICD-10-CM | POA: Insufficient documentation

## 2015-11-26 DIAGNOSIS — R072 Precordial pain: Secondary | ICD-10-CM | POA: Insufficient documentation

## 2015-11-26 DIAGNOSIS — R000 Tachycardia, unspecified: Secondary | ICD-10-CM | POA: Insufficient documentation

## 2015-11-26 DIAGNOSIS — G8929 Other chronic pain: Secondary | ICD-10-CM | POA: Insufficient documentation

## 2015-11-26 DIAGNOSIS — R109 Unspecified abdominal pain: Secondary | ICD-10-CM

## 2015-11-26 DIAGNOSIS — R112 Nausea with vomiting, unspecified: Secondary | ICD-10-CM

## 2015-11-26 DIAGNOSIS — R0789 Other chest pain: Secondary | ICD-10-CM

## 2015-11-26 LAB — COMPREHENSIVE METABOLIC PANEL
ALK PHOS: 69 U/L (ref 38–126)
ALT: 27 U/L (ref 17–63)
AST: 29 U/L (ref 15–41)
Albumin: 5.8 g/dL — ABNORMAL HIGH (ref 3.5–5.0)
Anion gap: 13 (ref 5–15)
BUN: 30 mg/dL — ABNORMAL HIGH (ref 6–20)
CALCIUM: 10.8 mg/dL — AB (ref 8.9–10.3)
CO2: 22 mmol/L (ref 22–32)
CREATININE: 1.09 mg/dL (ref 0.61–1.24)
Chloride: 104 mmol/L (ref 101–111)
Glucose, Bld: 142 mg/dL — ABNORMAL HIGH (ref 65–99)
Potassium: 4.2 mmol/L (ref 3.5–5.1)
Sodium: 139 mmol/L (ref 135–145)
Total Bilirubin: 0.8 mg/dL (ref 0.3–1.2)
Total Protein: 9.6 g/dL — ABNORMAL HIGH (ref 6.5–8.1)

## 2015-11-26 LAB — CBC WITH DIFFERENTIAL/PLATELET
BASOS PCT: 0 %
Basophils Absolute: 0 10*3/uL (ref 0.0–0.1)
EOS ABS: 0 10*3/uL (ref 0.0–0.7)
Eosinophils Relative: 0 %
HCT: 40.2 % (ref 39.0–52.0)
HEMOGLOBIN: 13.5 g/dL (ref 13.0–17.0)
Lymphocytes Relative: 9 %
Lymphs Abs: 1.3 10*3/uL (ref 0.7–4.0)
MCH: 24.9 pg — ABNORMAL LOW (ref 26.0–34.0)
MCHC: 33.6 g/dL (ref 30.0–36.0)
MCV: 74.2 fL — ABNORMAL LOW (ref 78.0–100.0)
Monocytes Absolute: 0.7 10*3/uL (ref 0.1–1.0)
Monocytes Relative: 5 %
NEUTROS PCT: 86 %
Neutro Abs: 12.5 10*3/uL — ABNORMAL HIGH (ref 1.7–7.7)
Platelets: 337 10*3/uL (ref 150–400)
RBC: 5.42 MIL/uL (ref 4.22–5.81)
RDW: 18.7 % — ABNORMAL HIGH (ref 11.5–15.5)
WBC: 14.6 10*3/uL — AB (ref 4.0–10.5)

## 2015-11-26 LAB — URINALYSIS, ROUTINE W REFLEX MICROSCOPIC
BILIRUBIN URINE: NEGATIVE
Glucose, UA: NEGATIVE mg/dL
KETONES UR: NEGATIVE mg/dL
Leukocytes, UA: NEGATIVE
NITRITE: NEGATIVE
PROTEIN: 100 mg/dL — AB
Specific Gravity, Urine: 1.044 — ABNORMAL HIGH (ref 1.005–1.030)
pH: 8 (ref 5.0–8.0)

## 2015-11-26 LAB — I-STAT TROPONIN, ED: Troponin i, poc: 0 ng/mL (ref 0.00–0.08)

## 2015-11-26 LAB — LIPASE, BLOOD: LIPASE: 18 U/L (ref 11–51)

## 2015-11-26 LAB — URINE MICROSCOPIC-ADD ON

## 2015-11-26 MED ORDER — SODIUM CHLORIDE 0.9 % IV BOLUS (SEPSIS)
2000.0000 mL | Freq: Once | INTRAVENOUS | Status: AC
  Administered 2015-11-26: 2000 mL via INTRAVENOUS

## 2015-11-26 MED ORDER — PANTOPRAZOLE SODIUM 40 MG IV SOLR
40.0000 mg | Freq: Once | INTRAVENOUS | Status: AC
  Administered 2015-11-26: 40 mg via INTRAVENOUS
  Filled 2015-11-26: qty 40

## 2015-11-26 MED ORDER — GI COCKTAIL ~~LOC~~
30.0000 mL | Freq: Once | ORAL | Status: AC
  Administered 2015-11-26: 30 mL via ORAL
  Filled 2015-11-26: qty 30

## 2015-11-26 MED ORDER — IOPAMIDOL (ISOVUE-300) INJECTION 61%
100.0000 mL | Freq: Once | INTRAVENOUS | Status: AC | PRN
  Administered 2015-11-26: 100 mL via INTRAVENOUS

## 2015-11-26 MED ORDER — MORPHINE SULFATE (PF) 4 MG/ML IV SOLN
4.0000 mg | Freq: Once | INTRAVENOUS | Status: AC
  Administered 2015-11-26: 4 mg via INTRAVENOUS
  Filled 2015-11-26: qty 1

## 2015-11-26 MED ORDER — SODIUM CHLORIDE 0.9 % IV BOLUS (SEPSIS)
1000.0000 mL | Freq: Once | INTRAVENOUS | Status: AC
  Administered 2015-11-27: 1000 mL via INTRAVENOUS

## 2015-11-26 MED ORDER — PROCHLORPERAZINE EDISYLATE 5 MG/ML IJ SOLN
10.0000 mg | Freq: Once | INTRAMUSCULAR | Status: AC
  Administered 2015-11-26: 10 mg via INTRAVENOUS
  Filled 2015-11-26: qty 2

## 2015-11-26 MED ORDER — LORAZEPAM 2 MG/ML IJ SOLN
1.0000 mg | Freq: Once | INTRAMUSCULAR | Status: AC
  Administered 2015-11-26: 1 mg via INTRAVENOUS
  Filled 2015-11-26: qty 1

## 2015-11-26 MED ORDER — MORPHINE SULFATE (PF) 4 MG/ML IV SOLN
4.0000 mg | Freq: Once | INTRAVENOUS | Status: AC
  Administered 2015-11-27: 4 mg via INTRAVENOUS
  Filled 2015-11-26: qty 1

## 2015-11-26 NOTE — ED Provider Notes (Signed)
CSN: [id number]     Arrival date & time 11/26/15  1537 History   First MD Initiated Contact with Patient 11/26/15 1854     Chief Complaint  Patient presents with  . Emesis  . Back Pain  . Neck Pain  . Arm Pain     (Consider location/radiation/quality/duration/timing/severity/associated sxs/prior Treatment) HPI Comments: Levi Palmer is a 30 y.o. male with a PMHx of anxiety, anemia, depression, bipolar disorder, cirrhosis, and chronic constipation, with a PSHx of cholecystectomy and EGD 05/2015 which revealed candidial esophagitis, who presents to the ED with complaints of nausea, vomiting, and chest pain 19 hours since around midnight last night. He has chronic abdominal pain in the right upper quadrant and epigastrium, and chronic issues with nausea and vomiting, states that around midnight he began having nonbloody nonbilious emesis, states that he's had >10x but <20x since onset. He reports that after his vomiting started, he developed central 10/10 intermittent stabbing type chest pain along his sternal area, nonradiating, worse with vomiting and movement, and with no treatments tried prior to arrival. He attempted to take Phenergan suppository and Zofran with no relief of his nausea and vomiting. He reports that his chronic abdominal pain is unchanged and in the same locations. He saw Dr. Ewing Schlein at [facility] last week, had a CT abd/pelvis on 11/23/15 but hasn't gotten the results back yet. States he got that scan for similar symptoms, but that these symptoms recurred at midnight and had not been a constant issue since the scan was done. 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   He does admit to taking NSAIDs  occasionally.  He denies any fevers, chills, lightheadedness, diaphoresis, shortness breath, hematemesis, melena, hematochezia, diarrhea, obstipation, constipation, dysuria, hematuria, testicular pain or swelling, penile discharge, new numbness/tingling, or focal weakness. Denies any recent travel, sick contacts, suspicious food intake, alcohol use, or recent antibiotics.   Patient is a 30 y.o. male presenting with vomiting, back pain, neck pain, and arm pain. The history is provided by the patient and medical records. No language interpreter was used.  Emesis Severity:  Moderate Duration:  19 hours Timing:  Constant Number of daily episodes:  >10x, unknown exact amount but <20x Quality:  Stomach contents Progression:  Unchanged Chronicity:  Recurrent Recent urination:  Normal Relieved by:  Nothing Worsened by:  Nothing tried Ineffective treatments:  Antiemetics Associated symptoms: abdominal pain (chronic RUQ/epigastric pain, unchanged)   Associated symptoms: no arthralgias, no chills, no diarrhea and no myalgias   Risk factors: prior abdominal surgery   Risk factors: no alcohol use, no diabetes, no sick contacts, no suspect food intake and no travel to endemic areas   Back Pain Associated symptoms: abdominal pain (chronic RUQ/epigastric pain, unchanged) and chest pain   Associated symptoms: no dysuria, no fever, no numbness and no weakness   Neck Pain Associated symptoms: chest pain   Associated symptoms: no fever, no numbness and no weakness   Arm Pain Associated symptoms include abdominal pain (chronic RUQ/epigastric pain, unchanged), chest pain, nausea, neck pain (unchanged from prior visit) and vomiting. Pertinent negatives include no arthralgias, chills, diaphoresis, fever, myalgias, numbness or weakness.    Past Medical History  Diagnosis Date  .  Medical history non-contributory   . Anxiety   . Panic attack as reaction to stress   . Anemia   . Depression   . Gallstones     . Dysrhythmia     irregular heart rate - due to zoloft  . Bipolar disorder (HCC)   . Cirrhosis (HCC)   . Constipation    Past Surgical History  Procedure Laterality Date  . Cholecystectomy    . Tonsillectomy    . Incise and drain abcess    . Mouth surgery    . Esophagogastroduodenoscopy N/A 05/22/2015    Procedure: ESOPHAGOGASTRODUODENOSCOPY (EGD);  Surgeon: Vida Rigger, MD;  Location: [facility] ENDOSCOPY;  Service: Endoscopy;  Laterality: N/A;   Family History  Problem Relation Age of Onset  . Irritable bowel syndrome Mother   . Kidney disease Mother   . Ulcerative colitis Maternal Grandfather   . Colon cancer Neg Hx   . Colon polyps Neg Hx   . Gallbladder disease Neg Hx   . Esophageal cancer Neg Hx   . Diabetes Neg Hx    Social History  Substance Use Topics  . Smoking status: Never Smoker   . Smokeless tobacco: Never Used  . Alcohol Use: No    Review of Systems  Constitutional: Negative for fever, chills and diaphoresis.  Respiratory: Negative for shortness of breath.   Cardiovascular: Positive for chest pain.  Gastrointestinal: Positive for nausea, vomiting and abdominal pain (chronic RUQ/epigastric pain, unchanged). Negative for diarrhea, constipation, blood in stool and anal bleeding.  Genitourinary: Negative for dysuria, hematuria, discharge, scrotal swelling and testicular pain.  Musculoskeletal: Positive for back pain and neck pain (unchanged from prior visit). Negative for myalgias and arthralgias.  Skin: Negative for color change.  Allergic/Immunologic: Negative for immunocompromised state.  Neurological: Negative for weakness, light-headedness and numbness.  Psychiatric/Behavioral: Negative for confusion.   10 Systems reviewed and are negative for acute change except as noted in the HPI.    Allergies  Reglan; Toradol; Tramadol; and Zoloft  Home Medications   Prior to Admission medications   Medication Sig Start Date End Date Taking? Authorizing Provider   acetaminophen-codeine (TYLENOL #3) 300-30 MG tablet Take 1 tablet by mouth every 6 (six) hours as needed for moderate pain. 11/15/15   Josalyn Funches, MD  benzonatate (TESSALON PERLES) 100 MG capsule Take 1 capsule (100 mg total) by mouth 3 (three) times daily as needed for cough. 11/16/15   Josalyn Funches, MD  clonazePAM (KLONOPIN) 1 MG tablet Take 1 tablet (1 mg total) by mouth 2 (two) times daily. 11/01/15   Josalyn Funches, MD  diphenhydrAMINE (BENADRYL) 25 mg capsule Take 50 mg by mouth at bedtime as needed for sleep.    Historical Provider, MD  HYDROmorphone (DILAUDID) 4 MG tablet Take 1 tablet (4 mg total) by mouth every 6 (six) hours as needed for moderate pain or severe pain. 11/13/15   Bethann Berkshire, MD  HYDROmorphone (DILAUDID) 4 MG tablet Take 1 tablet (4 mg total) by mouth every 6 (six) hours as needed for severe pain. 11/13/15   Bethann Berkshire, MD  ibuprofen (ADVIL,MOTRIN) 200 MG tablet Take 400-800 mg by mouth every 6 (six) hours as needed for moderate pain.     Historical Provider, MD  LINZESS 290 MCG CAPS capsule Take 290 mcg by mouth daily. 07/16/15   Historical Provider, MD  naproxen (NAPROSYN) 250 MG tablet Take 1 tablet (250 mg total) by mouth 2 (two) times daily with a meal. Patient not taking: Reported on 11/13/2015 10/10/15  Everlene Farrier, PA-C  predniSONE (DELTASONE) 20 MG tablet Take 2 tablets (40 mg total) by mouth daily with breakfast. 11/13/15   Dessa Phi, MD  pregabalin (LYRICA) 50 MG capsule Take 1 capsule (50 mg total) by mouth 3 (three) times daily. 09/20/15   Dessa Phi, MD  prochlorperazine (COMPAZINE) 10 MG tablet Take 1 tablet (10 mg total) by mouth 2 (two) times daily as needed for nausea or vomiting. Patient not taking: Reported on 11/13/2015 11/10/15   Tywaun Hiltner Camprubi-Soms, PA-C  promethazine (PHENERGAN) 25 MG suppository Place 1 suppository (25 mg total) rectally every 6 (six) hours as needed for nausea, vomiting or refractory nausea / vomiting. Patient not  taking: Reported on 11/13/2015 11/10/15   Everley Evora Camprubi-Soms, PA-C  promethazine (PHENERGAN) 25 MG tablet Take 25 mg by mouth every 8 (eight) hours as needed. 10/25/15   Historical Provider, MD  ranitidine (ZANTAC) 150 MG tablet Take 1 tablet (150 mg total) by mouth 2 (two) times daily. Patient not taking: Reported on 11/13/2015 11/10/15   Yoselyn Mcglade Camprubi-Soms, PA-C  sucralfate (CARAFATE) 1 GM/10ML suspension Take 10 mLs (1 g total) by mouth 4 (four) times daily -  with meals and at bedtime. Patient not taking: Reported on 11/13/2015 11/12/15   Dessa Phi, MD  Vitamin D, Ergocalciferol, (DRISDOL) 50000 units CAPS capsule Take 1 capsule (50,000 Units total) by mouth every 7 (seven) days. For 8 weeks 09/20/15   Josalyn Funches, MD   BP 139/93 mmHg  Pulse 129  Temp(Src) 98.6 F (37 C) (Oral)  Resp 20  SpO2 97% Physical Exam  Constitutional: He is oriented to person, place, and time. He appears well-developed and well-nourished.  Non-toxic appearance. He appears distressed (uncomfortable appearing).  Afebrile, nontoxic, tachycardic and appears uncomfortable, writhing around in bed  HENT:  Head: Normocephalic and atraumatic.  Mouth/Throat: Mucous membranes are dry.  Dry mucous membranes  Eyes: Conjunctivae and EOM are normal. Right eye exhibits no discharge. Left eye exhibits no discharge.  Neck: Normal range of motion. Neck supple.  Cardiovascular: Regular rhythm, normal heart sounds and intact distal pulses.  Tachycardia present.  Exam reveals no gallop and no friction rub.   No murmur heard. Tachycardic in the 120s, reg rhythm, nl s1/s2, no m/r/g, distal pulses intact, no pedal edema  Pulmonary/Chest: Effort normal and breath sounds normal. No respiratory distress. He has no decreased breath sounds. He has no wheezes. He has no rhonchi. He has no rales. He exhibits tenderness. He exhibits no crepitus, no deformity and no retraction.    Chest wall with mild TTP along sternal region into  epigastrum, without crepitus, deformities, or retractions   Abdominal: Soft. Normal appearance and bowel sounds are normal. He exhibits no distension. There is tenderness in the right upper quadrant and epigastric area. There is no rigidity, no rebound, no guarding, no CVA tenderness, no tenderness at McBurney's point and negative Murphy's sign.    Soft, nondistended, +BS throughout, with moderate tenderness in the RUQ/epigastric region, no r/g/r, neg murphy's, neg mcburney's, no CVA TTP   Musculoskeletal: Normal range of motion.  MAE x4 Strength and sensation grossly intact Distal pulses intact No pedal edema  Neurological: He is alert and oriented to person, place, and time. He has normal strength. No sensory deficit.  Skin: Skin is warm, dry and intact. No rash noted.  Psychiatric: He has a normal mood and affect.  Nursing note and vitals reviewed.   ED Course  Procedures (including critical care time) Labs Review Labs Reviewed  COMPREHENSIVE  METABOLIC PANEL - Abnormal; Notable for the following:    Glucose, Bld 142 (*)    BUN 30 (*)    Calcium 10.8 (*)    Total Protein 9.6 (*)    Albumin 5.8 (*)    All other components within normal limits  URINALYSIS, ROUTINE W REFLEX MICROSCOPIC (NOT AT [facility]) - Abnormal; Notable for the following:    Color, Urine AMBER (*)    APPearance TURBID (*)    Specific Gravity, Urine 1.044 (*)    Hgb urine dipstick SMALL (*)    Protein, ur 100 (*)    All other components within normal limits  CBC WITH DIFFERENTIAL/PLATELET - Abnormal; Notable for the following:    WBC 14.6 (*)    MCV 74.2 (*)    MCH 24.9 (*)    RDW 18.7 (*)    Neutro Abs 12.5 (*)    All other components within normal limits  URINE MICROSCOPIC-ADD ON - Abnormal; Notable for the following:    Squamous Epithelial / LPF 0-5 (*)    Bacteria, UA RARE (*)    All other components within normal limits  LIPASE, BLOOD  I-STAT TROPOININ, ED    Imaging Review Ct Abdomen Pelvis W  Contrast  11/26/2015  CLINICAL DATA:  Increased abdominal pain, nausea and vomiting. Recent CT with small bowel intussusception. EXAM: CT ABDOMEN AND PELVIS WITH CONTRAST TECHNIQUE: Multidetector CT imaging of the abdomen and pelvis was performed using the standard protocol following bolus administration of intravenous contrast. CONTRAST:  100mL ISOVUE-300 IOPAMIDOL (ISOVUE-300) INJECTION 61% COMPARISON:  Most recent CT 3 days prior 11/23/2015. Additional prior CTs reviewed. FINDINGS: Lower chest:  The included lung bases are clear. Liver: Stable subcentimeter hypodensity in the right hepatic lobe. No new abnormality. Hepatobiliary: Clips in the gallbladder fossa postcholecystectomy. No biliary dilatation. Pancreas: No ductal dilatation or inflammation. Spleen: Normal. Adrenal glands: No nodule. Kidneys: Symmetric renal enhancement. No hydronephrosis. Nonobstructing 3 mm left upper pole calculus, unchanged. Accessory lower pole left renal artery arising from the distal aorta. Stomach/Bowel: Lack of enteric contrast and paucity of intra-abdominal fat limits bowel assessment. Stomach is decompressed and not well assessed. There are no dilated or thickened small bowel loops. No evidence of bowel obstruction. The previous jejunal-jejunal intussusception in the left lower quadrant is no longer seen. Small volume of stool throughout the colon. Portions of the colon are decompressed, difficult to exclude underlying wall thickening. There is no pericolonic inflammation. The appendix is not confidently identified. No right lower quadrant or pericecal fat stranding. Vascular/Lymphatic: No retroperitoneal adenopathy. Small mesenteric and right external iliac nodes are unchanged. Abdominal aorta is normal in caliber. Reproductive: Prostate gland normal in size. Bladder: Physiologically distended, no wall thickening. Other: No free air, free fluid, or intra-abdominal fluid collection. Musculoskeletal: There are no acute or  suspicious osseous abnormalities. IMPRESSION: No bowel obstruction. Previous small bowel-small bowel intussusception is no longer seen. No new abnormality to explain abdominal pain. Electronically Signed   By: Rubye OaksMelanie  Ehinger M.D.   On: 11/26/2015 22:27   Dg Abd Acute W/chest  11/26/2015  CLINICAL DATA:  Acute right upper quadrant abdominal pain, nausea, vomiting. EXAM: DG ABDOMEN ACUTE W/ 1V CHEST COMPARISON:  CT scan of Nov 23, 2015. Chest radiograph of Nov 12, 2015. FINDINGS: There is no evidence of dilated bowel loops or free intraperitoneal air. Status post cholecystectomy. No radiopaque calculi or other significant radiographic abnormality is seen. Heart size and mediastinal contours are within normal limits. Both lungs are clear. IMPRESSION: No  evidence of bowel obstruction or ileus. No acute cardiopulmonary disease. Electronically Signed   By: Lupita Raider, M.D.   On: 11/26/2015 21:40    Dg Chest 2 View  11/12/2015  CLINICAL DATA:  Cough. Upper right chest pain. Worse with taking deep breaths. Nausea and vomiting today. EXAM: CHEST - 2 VIEW COMPARISON:  None. FINDINGS: The heart size and mediastinal contours are within normal limits. Both lungs are clear. The visualized skeletal structures are unremarkable. IMPRESSION: Negative two view chest x-ray Electronically Signed   By: Marin Roberts M.D.   On: 11/12/2015 07:28   Mr Cervical Spine Wo Contrast  11/13/2015  CLINICAL DATA:  Neck pain EXAM: MRI CERVICAL SPINE WITHOUT CONTRAST TECHNIQUE: Multiplanar, multisequence MR imaging of the cervical spine was performed. No intravenous contrast was administered. COMPARISON:  Cervical MRI 04/19/2015 FINDINGS: Image quality degraded by mild motion Moderate cervical kyphosis which has progressed since prior study. Normal alignment. No fracture or mass Spinal cord signal normal. No cord lesion. Cervical medullary junction normal. C2-3:  Negative C3-4:  Negative C4-5: Mild disc degeneration and disc  bulging unchanged from the prior study. Mild cord flattening ventrally C5-6: Small right paracentral disc protrusion has progressed. This is causing increased cord deformity on the right with cord flattening. Mild spinal stenosis. Neural foramina adequately patent. C6-7: Interval improvement in right-sided disc protrusion which previously was larger and extending into the foramen. There is now some associated right foraminal narrowing due to uncinate spurring. There is cord flattening and moderate spinal stenosis. C7-T1: Negative IMPRESSION: Small right paracentral disc protrusion at C5-6 has progressed. Increased mass-effect on the right side of the cord with mild spinal stenosis. Improvement in right-sided disc protrusion since the prior study. There remains a small right sided disc protrusion and uncinate spurring on the right causing moderate spinal stenosis and mild right foraminal stenosis. Electronically Signed   By: Marlan Palau M.D.   On: 11/13/2015 21:27   Ct Abdomen Pelvis W Contrast  11/23/2015  CLINICAL DATA:  Nausea, vomiting, and diarrhea. Chronic change in bowel movements. EXAM: CT ABDOMEN AND PELVIS WITH CONTRAST TECHNIQUE: Multidetector CT imaging of the abdomen and pelvis was performed using the standard protocol following bolus administration of intravenous contrast. CONTRAST:  ISOVUE-300 IOPAMIDOL (ISOVUE-300) INJECTION 61% COMPARISON:  Multiple exams, including 06/28/2015 FINDINGS: Lower chest:  Unremarkable Hepatobiliary: Nonspecific 5 mm hypodense lesion posteriorly in segment 7, image 13/2, no change 07/26/2013. Cholecystectomy. Pancreas: Unremarkable Spleen: Unremarkable Adrenals/Urinary Tract: 3 mm left kidney upper pole nonobstructive calculus image 24/2. Stomach/Bowel: 5 cm segment of jejuno jejunal intussusception, image 26/3. No obvious underlying lesion/cause. Stool-like appearance in the terminal ileum. Appendix not well seen. Vascular/Lymphatic: Right external iliac nodes  measure up to 0.8 cm in short axis. Upper normal sized lymph nodes along the sigmoid mesentery. Reproductive: Unremarkable Other: No supplemental non-categorized findings. Musculoskeletal: Unremarkable IMPRESSION: 1. 5 cm segment of jejuno jejunal intussusception, no obvious underlying lesion. 2. Small fat right external iliac and sigmoid mesocolon lymph nodes are present and could be reactive, but are not overtly pathologically enlarged. 3. Stable 5 mm hypodense hepatic lesion in segment 7, no change from January 2015, likely benign. Electronically Signed   By: Gaylyn Rong M.D.   On: 11/23/2015 13:19   I have personally reviewed and evaluated these images and lab results as part of my medical decision-making.    EKG Interpretation   Date/Time:  Monday Nov 26 2015 21:01:24 EDT Ventricular Rate:  105 PR Interval:  125 QRS  Duration: 95 QT Interval:  337 QTC Calculation: 445 R Axis:   95 Text Interpretation:  Sinus tachycardia Consider right ventricular  hypertrophy Since previous ECG, rate has increased Confirmed by [facility]  MD, ERIN ([id number]) on 11/26/2015 10:29:21 PM      MDM   Final diagnoses:  Generalized abdominal pain  Chronic abdominal pain  Non-intractable vomiting with nausea, vomiting of unspecified type  Atypical chest pain  Dehydration  Tachycardia    30 y.o. male here with recurrent n/v that began at midnight tonight, unrelieved with home zofran/phenergan. Has chronic epigastric and RUQ abd pain which he states is unchanged. Also complains of CP that began after he started vomiting. Also states his neck pain was exacerbated by vomiting, MRI was done recently which showed disc protrusion, doubt need for further work up today for his neck pain. On exam, tachycardic which has been seen on prior ER exams, appears dehydrated with dry mucous membranes, abdomen nondistended with tenderness in epigastrum/RUQ, no r/g/r. No hypoxia and he denies SOB, doubt this is a PE, CP is  likely from the vomiting. Interestingly, he was seen by GI and had CT abd/pelv ordered on 11/23/15 which showed a 5cm jejuno-jejunal intussusception segment, he states he hadn't heard back yet on the results. He has no signs of obstruction on exam, but given this recent CT finding, I will discuss the case with his GI dr and likely surgery. Will get labs and acute abd series, and EKG. Will give fluids, compazine, morphine, ativan, protonix, and GI cocktail if possible, and then reassess after I've discussed the case with GI/surgery. Discussed case with my attending Dr. Dalene Seltzer who agrees with plan.   8:03 PM Dr. Evette Cristal of [facility] returning page, states that if he had intussusception we would expect bowel obstruction s/sx, and his exam and history aren't really consistent with this; thinks that the best next step is to re-CT him and if he has obstruction then treat appropriately (surgery/medicine consults, etc). Updated pt with this plan, will still get acute abd series and labs as previously mentioned. Will reassess shortly  11:35 PM U/A with evidence of dehydration but without UTI. Trop neg, EKG without ischemic changes. Lipase WNL. CMP with gluc 142, BUN 30 and Cr 1.09 which is elevated from 2wks ago and likely from dehydration. CBC w/diff showing WBC 14.6 but values appear hemoconcentrated therefore likely not from infectious etiology. CT shows no obstruction and no longer shows the intussusception region. Acute abd series unremarkable as well. Pt feeling somewhat better with no ongoing nausea but still having pain, will give another dose of pain meds. Still tachycardic in the 110s, coming down but still elevated, will give another bolus. Will reassess after this. I do fear that this pt may be drug seeking, he's had multiple visits for similar symptoms, has not had a significant pathology found for his symptoms, and seems to fixate on the fact that he only gets tylenol #3 for pain from his PCP and doesn't  understand why he can't get stronger narcotics. Discussed that narcotics could make his symptoms worse and really we should be trying to avoid these, but he still fixates on the fact that he's having to take 120 pills a month of Tylenol #3 that isn't helping but that a stronger narcotic would require few pills and work better. I'm concerned that this might be drug seeking behaviors. Discussed he needs to f/up with his PCP regarding narcotic regimen.  12:40 AM HR improving, down to 94,  pt feeling better and tolerating PO well. Has tylenol#3 at home, and zofran/compazine/phenergan. Discussed diet changes to help his symptoms. F/up with PCP and GI in 3-5 days for ongoing management of his chronic abd pain. CP likely from vomiting, doubt need for further work up today. Pt stable and ready to go home. I explained the diagnosis and have given explicit precautions to return to the ER including for any other new or worsening symptoms. The patient understands and accepts the medical plan as it's been dictated and I have answered their questions. Discharge instructions concerning home care and prescriptions have been given. The patient is STABLE and is discharged to home in good condition.   BP 159/118 mmHg  Pulse 94  Temp(Src) 98.6 F (37 C) (Oral)  Resp 14  SpO2 100%  Meds ordered this encounter  Medications  . sodium chloride 0.9 % bolus 2,000 mL    Sig:   . pantoprazole (PROTONIX) injection 40 mg    Sig:   . prochlorperazine (COMPAZINE) injection 10 mg    Sig:   . LORazepam (ATIVAN) injection 1 mg    Sig:   . gi cocktail (Maalox,Lidocaine,Donnatal)    Sig:   . morphine 4 MG/ML injection 4 mg    Sig:   . iopamidol (ISOVUE-300) 61 % injection 100 mL    Sig:   . sodium chloride 0.9 % bolus 1,000 mL    Sig:   . morphine 4 MG/ML injection 4 mg    Sig:     Sadik Piascik Camprubi-Soms, PA-C 11/27/15 0044  Alvira Monday, MD 11/28/15 1352

## 2015-11-26 NOTE — ED Notes (Signed)
Per EMS pt comes from hotel c/o vomiting, back pain, neck pain, and right upper posterior arm pain.

## 2015-11-26 NOTE — ED Notes (Signed)
Patient refused to let me collect his labs she stated that he wants to wait for his IV

## 2015-11-26 NOTE — ED Notes (Signed)
Pt in CT.

## 2015-11-26 NOTE — ED Notes (Signed)
Patient aware urine sample is needed.  

## 2015-11-27 ENCOUNTER — Encounter: Payer: Self-pay | Admitting: Family Medicine

## 2015-11-27 ENCOUNTER — Emergency Department (HOSPITAL_COMMUNITY): Payer: Self-pay

## 2015-11-27 ENCOUNTER — Encounter (HOSPITAL_COMMUNITY): Payer: Self-pay

## 2015-11-27 ENCOUNTER — Emergency Department (HOSPITAL_COMMUNITY)
Admission: EM | Admit: 2015-11-27 | Discharge: 2015-11-27 | Disposition: A | Discharge status: Home / Self Care | Payer: Self-pay | Attending: Emergency Medicine | Admitting: Emergency Medicine

## 2015-11-27 DIAGNOSIS — Z862 Personal history of diseases of the blood and blood-forming organs and certain disorders involving the immune mechanism: Secondary | ICD-10-CM | POA: Insufficient documentation

## 2015-11-27 DIAGNOSIS — E8889 Other specified metabolic disorders: Secondary | ICD-10-CM | POA: Insufficient documentation

## 2015-11-27 DIAGNOSIS — K209 Esophagitis, unspecified without bleeding: Secondary | ICD-10-CM

## 2015-11-27 DIAGNOSIS — R079 Chest pain, unspecified: Secondary | ICD-10-CM

## 2015-11-27 DIAGNOSIS — Z79899 Other long term (current) drug therapy: Secondary | ICD-10-CM | POA: Insufficient documentation

## 2015-11-27 DIAGNOSIS — F410 Panic disorder [episodic paroxysmal anxiety] without agoraphobia: Secondary | ICD-10-CM | POA: Insufficient documentation

## 2015-11-27 DIAGNOSIS — Z7952 Long term (current) use of systemic steroids: Secondary | ICD-10-CM | POA: Insufficient documentation

## 2015-11-27 DIAGNOSIS — Z8679 Personal history of other diseases of the circulatory system: Secondary | ICD-10-CM | POA: Insufficient documentation

## 2015-11-27 LAB — CBC
HCT: 37.1 % — ABNORMAL LOW (ref 39.0–52.0)
HEMOGLOBIN: 11.5 g/dL — AB (ref 13.0–17.0)
MCH: 23.4 pg — ABNORMAL LOW (ref 26.0–34.0)
MCHC: 31 g/dL (ref 30.0–36.0)
MCV: 75.6 fL — ABNORMAL LOW (ref 78.0–100.0)
Platelets: 239 10*3/uL (ref 150–400)
RBC: 4.91 MIL/uL (ref 4.22–5.81)
RDW: 19 % — AB (ref 11.5–15.5)
WBC: 8.6 10*3/uL (ref 4.0–10.5)

## 2015-11-27 LAB — BASIC METABOLIC PANEL
ANION GAP: 9 (ref 5–15)
BUN: 19 mg/dL (ref 6–20)
CALCIUM: 9.6 mg/dL (ref 8.9–10.3)
CO2: 22 mmol/L (ref 22–32)
Chloride: 105 mmol/L (ref 101–111)
Creatinine, Ser: 0.81 mg/dL (ref 0.61–1.24)
GFR calc Af Amer: >60 mL/min (ref >60)
GLUCOSE: 100 mg/dL — AB (ref 65–99)
Potassium: 3.7 mmol/L (ref 3.5–5.1)
SODIUM: 136 mmol/L (ref 135–145)

## 2015-11-27 LAB — I-STAT TROPONIN, ED: TROPONIN I, POC: 0 ng/mL (ref 0.00–0.08)

## 2015-11-27 MED ORDER — SUCRALFATE 1 GM/10ML PO SUSP: 1.0000 g | Freq: Three times a day (TID) | ORAL | Status: DC

## 2015-11-27 MED ORDER — SUCRALFATE 1 G PO TABS
1.0000 g | ORAL_TABLET | Freq: Once | ORAL | Status: AC
  Administered 2015-11-27: 1 g via ORAL
  Filled 2015-11-27: qty 1

## 2015-11-27 MED ORDER — SODIUM CHLORIDE 0.9 % IV SOLN
1000.0000 mL | Freq: Once | INTRAVENOUS | Status: AC
Start: 2015-11-27 — End: 2015-11-27
  Administered 2015-11-27: 1000 mL via INTRAVENOUS

## 2015-11-27 MED ORDER — FAMOTIDINE 20 MG PO TABS
20.0000 mg | ORAL_TABLET | Freq: Once | ORAL | Status: AC
  Administered 2015-11-27: 20 mg via ORAL
  Filled 2015-11-27: qty 1

## 2015-11-27 MED ORDER — OXYCODONE-ACETAMINOPHEN 5-325 MG PO TABS
2.0000 | ORAL_TABLET | Freq: Once | ORAL | Status: AC
  Administered 2015-11-27: 2 via ORAL
  Filled 2015-11-27: qty 2

## 2015-11-27 MED ORDER — CLOTRIMAZOLE 10 MG MT TROC: 10.0000 mg | Freq: Every day | OROMUCOSAL | Status: DC

## 2015-11-27 NOTE — Discharge Instructions (Signed)
Suspected Esophagitis Esophagitis is inflammation of the esophagus. The esophagus is the tube that carries food and liquids from your mouth to your stomach. Esophagitis can cause soreness or pain in the esophagus. This condition can make it difficult and painful to swallow.  CAUSES Most causes of esophagitis are not serious. Common causes of this condition include:  Gastroesophageal reflux disease (GERD). This is when stomach contents move back up into the esophagus (reflux).  Repeated vomiting.  An allergic-type reaction, especially caused by food allergies (eosinophilic esophagitis).  Injury to the esophagus by swallowing large pills with or without water, or swallowing certain types of medicines.  Swallowing (ingesting) harmful chemicals, such as household cleaning products.  Heavy alcohol use.  An infection of the esophagus.This most often occurs in people who have a weakened immune system.  Radiation or chemotherapy treatment for cancer.  Certain diseases such as sarcoidosis, Crohn disease, and scleroderma. SYMPTOMS Symptoms of this condition include:  Difficult or painful swallowing.  Pain with swallowing acidic liquids, such as citrus juices.  Pain with burping.  Chest pain.  Difficulty breathing.  Nausea.  Vomiting.  Pain in the abdomen.  Weight loss.  Ulcers in the mouth.  Patches of white material in the mouth (candidiasis).  Fever.  Coughing up blood or vomiting blood.  Stool that is black, tarry, or bright red. DIAGNOSIS Your health care provider will take a medical history and perform a physical exam. You may also have other tests, including:  An endoscopy to examine your stomach and esophagus with a small camera.  A test that measures the acidity level in your esophagus.  A test that measures how much pressure is on your esophagus.  A barium swallow or modified barium swallow to show the shape, size, and functioning of your  esophagus.  Allergy tests. TREATMENT Treatment for this condition depends on the cause of your esophagitis. In some cases, steroids or other medicines may be given to help relieve your symptoms or to treat the underlying cause of your condition. You may have to make some lifestyle changes, such as:  Avoiding alcohol.  Quitting smoking.  Changing your diet.  Exercising.  Changing your sleep habits and your sleep environment. HOME CARE INSTRUCTIONS Take these actions to decrease your discomfort and to help avoid complications. Diet  Follow a diet as recommended by your health care provider. This may involve avoiding foods and drinks such as:  Coffee and tea (with or without caffeine).  Drinks that contain alcohol.  Energy drinks and sports drinks.  Carbonated drinks or sodas.  Chocolate and cocoa.  Peppermint and mint flavorings.  Garlic and onions.  Horseradish.  Spicy and acidic foods, including peppers, chili powder, curry powder, vinegar, hot sauces, and barbecue sauce.  Citrus fruit juices and citrus fruits, such as oranges, lemons, and limes.  Tomato-based foods, such as red sauce, chili, salsa, and pizza with red sauce.  Fried and fatty foods, such as donuts, french fries, potato chips, and high-fat dressings.  High-fat meats, such as hot dogs and fatty cuts of red and white meats, such as rib eye steak, sausage, ham, and bacon.  High-fat dairy items, such as whole milk, butter, and cream cheese.  Eat small, frequent meals instead of large meals.  Avoid drinking large amounts of liquid with your meals.  Avoid eating meals during the 2-3 hours before bedtime.  Avoid lying down right after you eat.  Do not exercise right after you eat.  Avoid foods and drinks that seem  to make your symptoms worse. General Instructions  Pay attention to any changes in your symptoms.  Take over-the-counter and prescription medicines only as told by your health care  provider. Do not take aspirin, ibuprofen, or other NSAIDs unless your health care provider told you to do so.  If you have trouble taking pills, use a pill splitter to decrease the size of the pill. This will decrease the chance of the pill getting stuck or injuring your esophagus on the way down. Also, drink water after you take a pill.  Do not use any tobacco products, including cigarettes, chewing tobacco, and e-cigarettes. If you need help quitting, ask your health care provider.  Wear loose-fitting clothing. Do not wear anything tight around your waist that causes pressure on your abdomen.  Raise (elevate) the head of your bed about 6 inches (15 cm).  Try to reduce your stress, such as with yoga or meditation. If you need help reducing stress, ask your health care provider.  If you are overweight, reduce your weight to an amount that is healthy for you. Ask your health care provider for guidance about a safe weight loss goal.  Keep all follow-up visits as told by your health care provider. This is important. SEEK MEDICAL CARE IF:  You have new symptoms.  You have unexplained weight loss.  You have difficulty swallowing, or it hurts to swallow.  You have wheezing or a persistent cough.  Your symptoms do not improve with treatment.  You have frequent heartburn for more than two weeks. SEEK IMMEDIATE MEDICAL CARE IF:  You have severe pain in your arms, neck, jaw, teeth, or back.  You feel sweaty, dizzy, or light-headed.  You have chest pain or shortness of breath.  You vomit and your vomit looks like blood or coffee grounds.  Your stool is bloody or black.  You have a fever.  You cannot swallow, drink, or eat.   This information is not intended to replace advice given to you by your health care provider. Make sure you discuss any questions you have with your health care provider.   Document Released: 07/31/2004 Document Revised: 03/14/2015 Document Reviewed:  10/18/2014 Elsevier Interactive Patient Education 2016 [employer]   Chest Wall Pain Chest wall pain is pain in or around the bones and muscles of your chest. Sometimes, an injury causes this pain. Sometimes, the cause may not be known. This pain may take several weeks or longer to get better. HOME CARE INSTRUCTIONS  Pay attention to any changes in your symptoms. Take these actions to help with your pain:   Rest as told by your health care provider.   Avoid activities that cause pain. These include any activities that use your chest muscles or your abdominal and side muscles to lift heavy items.   If directed, apply ice to the painful area:  Put ice in a plastic bag.  Place a towel between your skin and the bag.  Leave the ice on for 20 minutes, 2-3 times per day.  Take over-the-counter and prescription medicines only as told by your health care provider.  Do not use tobacco products, including cigarettes, chewing tobacco, and e-cigarettes. If you need help quitting, ask your health care provider.  Keep all follow-up visits as told by your health care provider. This is important. SEEK MEDICAL CARE IF:  You have a fever.  Your chest pain becomes worse.  You have new symptoms. SEEK IMMEDIATE MEDICAL CARE IF:  You have nausea or  vomiting.  You feel sweaty or light-headed.  You have a cough with phlegm (sputum) or you cough up blood.  You develop shortness of breath.   This information is not intended to replace advice given to you by your health care provider. Make sure you discuss any questions you have with your health care provider.   Document Released: 06/23/2005 Document Revised: 03/14/2015 Document Reviewed: 09/18/2014 Elsevier Interactive Patient Education [employer]

## 2015-11-27 NOTE — Discharge Instructions (Signed)
Your abdominal pain could be from gastritis or an ulcer, among a variety of other causes, but this seems to be a chronic issue. Take over the counter zantac and prilosec, and avoid spicy/fatty/acidic foods, avoid soda/coffee/tea. Avoid laying down flat within 30 minutes of eating. Avoid NSAIDs like ibuprofen/aleve/motrin/etc on an empty stomach. May consider using over the counter tums/maalox as needed for additional relief. Use your home zofran/phenergan/compazine as directed as needed for nausea. Use your home tylenol #3 as needed for pain. Follow up with the gastroenterologist and your regular doctor in 3-5 days for ongoing evaluation of your abdominal pain. Return to the ER for changes or worsening symptoms.  Abdominal (belly) pain can be caused by many things. Your caregiver performed an examination and possibly ordered blood/urine tests and imaging (CT scan, x-rays, ultrasound). Many cases can be observed and treated at home after initial evaluation in the emergency department. Even though you are being discharged home, abdominal pain can be unpredictable. Therefore, you need a repeated exam if your pain does not resolve, returns, or worsens. Most patients with abdominal pain don't have to be admitted to the hospital or have surgery, but serious problems like appendicitis and gallbladder attacks can start out as nonspecific pain. Many abdominal conditions cannot be diagnosed in one visit, so follow-up evaluations are very important. SEEK IMMEDIATE MEDICAL ATTENTION IF YOU DEVELOP ANY OF THE FOLLOWING SYMPTOMS:  The pain does not go away or becomes severe.   A temperature above 101 develops.   Repeated vomiting occurs (multiple episodes).   The pain becomes localized to portions of the abdomen. The right side could possibly be appendicitis. In an adult, the left lower portion of the abdomen could be colitis or diverticulitis.   Blood is being passed in stools or vomit (bright red or black tarry  stools).   Return also if you develop chest pain, difficulty breathing, dizziness or fainting, or become confused, poorly responsive, or inconsolable (young children).  The constipation stays for more than 4 days.   There is belly (abdominal) or rectal pain.   You do not seem to be getting better.      Abdominal Pain, Adult Many things can cause belly (abdominal) pain. Most times, the belly pain is not dangerous. Many cases of belly pain can be watched and treated at home. HOME CARE   Do not take medicines that help you go poop (laxatives) unless told to by your doctor.  Only take medicine as told by your doctor.  Eat or drink as told by your doctor. Your doctor will tell you if you should be on a special diet. GET HELP IF:  You do not know what is causing your belly pain.  You have belly pain while you are sick to your stomach (nauseous) or have runny poop (diarrhea).  You have pain while you pee or poop.  Your belly pain wakes you up at night.  You have belly pain that gets worse or better when you eat.  You have belly pain that gets worse when you eat fatty foods.  You have a fever. GET HELP RIGHT AWAY IF:   The pain does not go away within 2 hours.  You keep throwing up (vomiting).  The pain changes and is only in the right or left part of the belly.  You have bloody or tarry looking poop. MAKE SURE YOU:   Understand these instructions.  Will watch your condition.  Will get help right away if you are not  doing well or get worse.   This information is not intended to replace advice given to you by your health care provider. Make sure you discuss any questions you have with your health care provider.   Document Released: 12/10/2007 Document Revised: 07/14/2014 Document Reviewed: 03/02/2013 Elsevier Interactive Patient Education 2016 [employer]  Dehydration, Adult Dehydration means your body does not have as much fluid or water as it needs. It happens  when you take in less fluid than you lose. Your kidneys, brain, and heart will not work properly without the right amount of fluids.  Dehydration can range from mild to severe. It should be treated right away to help prevent it from becoming severe. HOME CARE  Drink enough fluid to keep your pee (urine) clear or pale yellow.  Drink water or fluid slowly by taking small sips. You can also try sucking on ice cubes.  Have food or drinks that contain electrolytes. Examples include bananas and sports drinks.  Take over-the-counter and prescription medicines only as told by your doctor.  Prepare oral rehydration solution (ORS) according to the instructions that came with it. Take sips of ORS every 5 minutes until your pee returns to normal.  If you are throwing up (vomiting) or have watery poop (diarrhea), keep trying to drink water, ORS, or both.  If you have watery poop, avoid:  Drinks with caffeine.  Fruit juice.  Milk.  Carbonated soft drinks.  Do not take salt tablets. This can lead to having too much sodium in your body (hypernatremia). GET HELP IF:  You cannot eat or drink without throwing up.  You have had mild watery poop for longer than 24 hours.  You have a fever. GET HELP RIGHT AWAY IF:   You have very strong thirst.  You have very bad watery poop.  You have not peed in 6-8 hours, or you have peed only a small amount of very dark pee.  You have shriveled skin.  You are dizzy, confused, or both.   This information is not intended to replace advice given to you by your health care provider. Make sure you discuss any questions you have with your health care provider.   Document Released: 04/19/2009 Document Revised: 03/14/2015 Document Reviewed: 11/08/2014 Elsevier Interactive Patient Education 2016 [employer]  Nausea and Vomiting Nausea means you feel sick to your stomach. Throwing up (vomiting) is a reflex where stomach contents come out of your  mouth. HOME CARE   Take medicine as told by your doctor.  Do not force yourself to eat. However, you do need to drink fluids.  If you feel like eating, eat a normal diet as told by your doctor.  Eat rice, wheat, potatoes, bread, lean meats, yogurt, fruits, and vegetables.  Avoid high-fat foods.  Drink enough fluids to keep your pee (urine) clear or pale yellow.  Ask your doctor how to replace body fluid losses (rehydrate). Signs of body fluid loss (dehydration) include:  Feeling very thirsty.  Dry lips and mouth.  Feeling dizzy.  Dark pee.  Peeing less than normal.  Feeling confused.  Fast breathing or heart rate. GET HELP RIGHT AWAY IF:   You have blood in your throw up.  You have black or bloody poop (stool).  You have a bad headache or stiff neck.  You feel confused.  You have bad belly (abdominal) pain.  You have chest pain or trouble breathing.  You do not pee at least once every 8 hours.  You have  cold, clammy skin.  You keep throwing up after 24 to 48 hours.  You have a fever. MAKE SURE YOU:   Understand these instructions.  Will watch your condition.  Will get help right away if you are not doing well or get worse.   This information is not intended to replace advice given to you by your health care provider. Make sure you discuss any questions you have with your health care provider.   Document Released: 12/10/2007 Document Revised: 09/15/2011 Document Reviewed: 11/22/2010 Elsevier Interactive Patient Education [employer]2016 [employer]

## 2015-11-27 NOTE — ED Notes (Signed)
Patient transported to X-ray 

## 2015-11-27 NOTE — ED Provider Notes (Signed)
CSN: [id number]     Arrival date & time 11/27/15  1148 History   First MD Initiated Contact with Patient 11/27/15 1402     Chief Complaint  Patient presents with  . Chest Pain     (Consider location/radiation/quality/duration/timing/severity/associated sxs/prior Treatment) HPI Patient was seen yesterday at [facility] and evaluated. Patient reports she's had vomiting and a low he has some frequent problems with recurrent vomiting and pain in his upper abdomen, he is concerned about the pain that he now has in his chest which indicates his central chest and radiates slightly to the right. States it is worse with movements and has somewhat of a sharp quality to it. He has not had fever or cough. Patient then stated that his is herniated. He reports he can't get in to see the neurologist told June and he doesn't have adequate pain control. No weakness or numbness of the upper extremities. Cervical pain is worse with certain movements. Past Medical History  Diagnosis Date  . Medical history non-contributory   . Anxiety   . Panic attack as reaction to stress   . Anemia   . Depression   . Gallstones   . Dysrhythmia     irregular heart rate - due to zoloft  . Bipolar disorder (HCC)   . Cirrhosis (HCC)   . Constipation    Past Surgical History  Procedure Laterality Date  . Cholecystectomy    . Tonsillectomy    . Incise and drain abcess    . Mouth surgery    . Esophagogastroduodenoscopy N/A 05/22/2015    Procedure: ESOPHAGOGASTRODUODENOSCOPY (EGD);  Surgeon: Vida Rigger, MD;  Location: [facility] ENDOSCOPY;  Service: Endoscopy;  Laterality: N/A;   Family History  Problem Relation Age of Onset  . Irritable bowel syndrome Mother   . Kidney disease Mother   . Ulcerative colitis Maternal Grandfather   . Colon cancer Neg Hx   . Colon polyps Neg Hx   . Gallbladder disease Neg Hx   . Esophageal cancer Neg Hx   . Diabetes Neg Hx    Social History  Substance Use Topics  . Smoking status: Never  Smoker   . Smokeless tobacco: Never Used  . Alcohol Use: No    Review of Systems 10 Systems reviewed and are negative for acute change except as noted in the HPI.    Allergies  Reglan; Toradol; Tramadol; and Zoloft  Home Medications   Prior to Admission medications   Medication Sig Start Date End Date Taking? Authorizing Provider  Vitamin D, Ergocalciferol, (DRISDOL) 50000 units CAPS capsule Take 1 capsule (50,000 Units total) by mouth every 7 (seven) days. For 8 weeks 09/20/15  Yes Josalyn Funches, MD  acetaminophen-codeine (TYLENOL #3) 300-30 MG tablet Take 1 tablet by mouth every 6 (six) hours as needed for moderate pain. 11/15/15   Josalyn Funches, MD  benzonatate (TESSALON PERLES) 100 MG capsule Take 1 capsule (100 mg total) by mouth 3 (three) times daily as needed for cough. 11/16/15   Josalyn Funches, MD  clonazePAM (KLONOPIN) 1 MG tablet Take 1 tablet (1 mg total) by mouth 2 (two) times daily. 11/01/15   Josalyn Funches, MD  clotrimazole (MYCELEX) 10 MG troche Take 1 tablet (10 mg total) by mouth 5 (five) times daily. 11/27/15   Arby Barrette, MD  diphenhydrAMINE (BENADRYL) 25 mg capsule Take 50 mg by mouth at bedtime as needed for sleep.    Historical Provider, MD  HYDROmorphone (DILAUDID) 4 MG tablet Take 1 tablet (4 mg  total) by mouth every 6 (six) hours as needed for moderate pain or severe pain. 11/13/15   Bethann Berkshire, MD  HYDROmorphone (DILAUDID) 4 MG tablet Take 1 tablet (4 mg total) by mouth every 6 (six) hours as needed for severe pain. 11/13/15   Bethann Berkshire, MD  ibuprofen (ADVIL,MOTRIN) 200 MG tablet Take 400-800 mg by mouth every 6 (six) hours as needed for moderate pain.     Historical Provider, MD  LINZESS 290 MCG CAPS capsule Take 290 mcg by mouth daily. 07/16/15   Historical Provider, MD  predniSONE (DELTASONE) 20 MG tablet Take 2 tablets (40 mg total) by mouth daily with breakfast. 11/13/15   Dessa Phi, MD  pregabalin (LYRICA) 50 MG capsule Take 1 capsule (50 mg  total) by mouth 3 (three) times daily. 09/20/15   Josalyn Funches, MD  promethazine (PHENERGAN) 25 MG tablet Take 25 mg by mouth every 8 (eight) hours as needed. 10/25/15   Historical Provider, MD  sucralfate (CARAFATE) 1 GM/10ML suspension Take 10 mLs (1 g total) by mouth 4 (four) times daily -  with meals and at bedtime. 11/27/15   Arby Barrette, MD   BP 131/89 mmHg  Pulse 101  Temp(Src) 98.2 F (36.8 C) (Oral)  Resp 22  Ht 5\' 7"  (1.702 m)  Wt 120 lb (54.432 kg)  BMI 18.79 kg/m2  SpO2 98% Physical Exam  Constitutional: He is oriented to person, place, and time. He appears well-developed and well-nourished.  Patient is borderline cachectic and anxious in appearance. He is nontoxic and alert. No respiratory distress.  HENT:  Head: Normocephalic and atraumatic.  Mouth/Throat: Oropharynx is clear and moist.  Very poor dentition.  Eyes: EOM are normal. Pupils are equal, round, and reactive to light.  Neck: Neck supple.  Cardiovascular: Normal rate, regular rhythm, normal heart sounds and intact distal pulses.   Pulmonary/Chest: Effort normal and breath sounds normal.  Abdominal: Soft. Bowel sounds are normal. He exhibits no distension. There is tenderness.  Patient endorses epigastric and right upper quadrant tenderness. No guarding.  Musculoskeletal: Normal range of motion. He exhibits no edema or tenderness.  Neurological: He is alert and oriented to person, place, and time. He has normal strength. Coordination normal. GCS eye subscore is 4. GCS verbal subscore is 5. GCS motor subscore is 6.  Skin: Skin is warm, dry and intact.  Psychiatric:  The patient is anxious    ED Course  Procedures (including critical care time) Labs Review Labs Reviewed  BASIC METABOLIC PANEL - Abnormal; Notable for the following:    Glucose, Bld 100 (*)    All other components within normal limits  CBC - Abnormal; Notable for the following:    Hemoglobin 11.5 (*)    HCT 37.1 (*)    MCV 75.6 (*)     MCH 23.4 (*)    RDW 19.0 (*)    All other components within normal limits  I-STAT TROPOININ, ED    Imaging Review Dg Chest 2 View  11/27/2015  CLINICAL DATA:  Vomiting since Sunday, mid chest pain, cervical pain EXAM: CHEST  2 VIEW COMPARISON:  11/26/2015 FINDINGS: Normal heart size, mediastinal contours, and pulmonary vascularity. Mild chronic bronchitic changes. Lungs otherwise clear. No pleural effusion or pneumothorax. No acute bony abnormalities. IMPRESSION: Chronic bronchitic changes without infiltrate. Electronically Signed   By: Ulyses Southward M.D.   On: 11/27/2015 15:22   Ct Abdomen Pelvis W Contrast  11/26/2015  CLINICAL DATA:  Increased abdominal pain, nausea and vomiting. Recent CT  with small bowel intussusception. EXAM: CT ABDOMEN AND PELVIS WITH CONTRAST TECHNIQUE: Multidetector CT imaging of the abdomen and pelvis was performed using the standard protocol following bolus administration of intravenous contrast. CONTRAST:  ISOVUE-300 IOPAMIDOL (ISOVUE-300) INJECTION 61% COMPARISON:  Most recent CT 3 days prior 11/23/2015. Additional prior CTs reviewed. FINDINGS: Lower chest:  The included lung bases are clear. Liver: Stable subcentimeter hypodensity in the right hepatic lobe. No new abnormality. Hepatobiliary: Clips in the gallbladder fossa postcholecystectomy. No biliary dilatation. Pancreas: No ductal dilatation or inflammation. Spleen: Normal. Adrenal glands: No nodule. Kidneys: Symmetric renal enhancement. No hydronephrosis. Nonobstructing 3 mm left upper pole calculus, unchanged. Accessory lower pole left renal artery arising from the distal aorta. Stomach/Bowel: Lack of enteric contrast and paucity of intra-abdominal fat limits bowel assessment. Stomach is decompressed and not well assessed. There are no dilated or thickened small bowel loops. No evidence of bowel obstruction. The previous jejunal-jejunal intussusception in the left lower quadrant is no longer seen. Small volume of  stool throughout the colon. Portions of the colon are decompressed, difficult to exclude underlying wall thickening. There is no pericolonic inflammation. The appendix is not confidently identified. No right lower quadrant or pericecal fat stranding. Vascular/Lymphatic: No retroperitoneal adenopathy. Small mesenteric and right external iliac nodes are unchanged. Abdominal aorta is normal in caliber. Reproductive: Prostate gland normal in size. Bladder: Physiologically distended, no wall thickening. Other: No free air, free fluid, or intra-abdominal fluid collection. Musculoskeletal: There are no acute or suspicious osseous abnormalities. IMPRESSION: No bowel obstruction. Previous small bowel-small bowel intussusception is no longer seen. No new abnormality to explain abdominal pain. Electronically Signed   By: Rubye Oaks M.D.   On: 11/26/2015 22:27   Dg Abd Acute W/chest  11/26/2015  CLINICAL DATA:  Acute right upper quadrant abdominal pain, nausea, vomiting. EXAM: DG ABDOMEN ACUTE W/ 1V CHEST COMPARISON:  CT scan of Nov 23, 2015. Chest radiograph of Nov 12, 2015. FINDINGS: There is no evidence of dilated bowel loops or free intraperitoneal air. Status post cholecystectomy. No radiopaque calculi or other significant radiographic abnormality is seen. Heart size and mediastinal contours are within normal limits. Both lungs are clear. IMPRESSION: No evidence of bowel obstruction or ileus. No acute cardiopulmonary disease. Electronically Signed   By: Lupita Raider, M.D.   On: 11/26/2015 21:40   I have personally reviewed and evaluated these images and lab results as part of my medical decision-making.   EKG Interpretation   Date/Time:  Tuesday Nov 27 2015 11:54:50 EDT Ventricular Rate:  115 PR Interval:  120 QRS Duration: 88 QT Interval:  320 QTC Calculation: 442 R Axis:   93 Text Interpretation:  Sinus tachycardia Rightward axis Borderline ECG  Confirmed by Donnald Garre, MD, Lebron Conners [phone]) on  11/27/2015 2:17:51 PM      MDM   Final diagnoses:  Chest pain, unspecified chest pain type  Esophagitis   Patient has complaint of chest pain. He had evaluation yesterday that was extensive in nature. CT scan of the abdomen did not show any acute findings. Chest x-ray as been repeated and there are no indications of pneumothorax, infiltrate. Patient is not febrile and breath sounds are clear. He has no leukocytosis. He had a prior history of esophagitis. I feel it is possible that he has a recurrence and he will be treated with Carafate and Mycelex troche. There is no indication the patient has sustained any change in terms of his cervical disc herniation. He does not have upper extremity weakness.  His abdomen is nonsurgical. I do feel is safe for continued outpatient management. His main concern was pain control. At this time we have had a detailed conversation regarding the non-prescription of narcotics at this time for essentially chronic problems. Both he and his companion advise me they are very aware of the problems with the opioid epidemic and heroine deaths that have occurred in their community to people they know.     Arby BarretteMarcy Amyjo Mizrachi, MD 11/27/15 [phone]

## 2015-11-27 NOTE — ED Notes (Signed)
Patient here with ongoing SSCP with radiation to right side x 2 days. [facility] and reports was told to come back if pain didn't change

## 2015-11-28 NOTE — Progress Notes (Signed)
Patient ID: Levi Palmer, Levi Palmer   DOB: May 07, 1986, 30 y.o.   MRN: [id number] Patient presented to the office to pick up Rx for neck and knee brace. I called patient to Room 2. He came back with his girlfriend. I informed them both that I will no longer me his primary care provider and that arrangements have been made to transition his care [facility] for primary care and mental health care.  He asked why he was being discharge. I informed him that the primary reason(s) are: 1. His non-compliance with instructions to seek mental health care. 2. His discontent with the care I am providing as evidence by his frequent calls, ED visit and emails. Especially one particular e-mail where he threatened self harm if he did not get pain medication. 3. He disrespectful and rude behavior with the staff.   He stated he did not understand this and that he did agree with the care plan and felt that we had made progress in his care. He stated that it is up to the specialist to figure out what is wrong with him. That he is in pain because he is on a "weak ass narcotic". And that he is afraid to start over and lose the progress he has made.   He asked if he had a choice and if the decision was negotiable. I informed him he could choose where he receives from here, but my decision is final.   I asked him to wait so that he could get information about the transition plan.  He and his girlfriend stated that they were on the bus and has missed the bus they were planning to take home. He left before receiving the information.  The discharge letter along with the transition of care information will be mailed to the patient via certified mail per the [facility] protocol.

## 2015-12-03 ENCOUNTER — Encounter (HOSPITAL_COMMUNITY): Payer: Self-pay

## 2015-12-03 ENCOUNTER — Emergency Department (HOSPITAL_COMMUNITY): Payer: Self-pay

## 2015-12-03 ENCOUNTER — Emergency Department (HOSPITAL_COMMUNITY)
Admission: EM | Admit: 2015-12-03 | Discharge: 2015-12-03 | Disposition: A | Discharge status: Home / Self Care | Payer: Self-pay | Attending: Emergency Medicine | Admitting: Emergency Medicine

## 2015-12-03 ENCOUNTER — Encounter: Payer: Self-pay | Admitting: Family Medicine

## 2015-12-03 ENCOUNTER — Telehealth: Payer: Self-pay | Admitting: Emergency Medicine

## 2015-12-03 DIAGNOSIS — R109 Unspecified abdominal pain: Secondary | ICD-10-CM

## 2015-12-03 DIAGNOSIS — R1032 Left lower quadrant pain: Secondary | ICD-10-CM | POA: Insufficient documentation

## 2015-12-03 DIAGNOSIS — R1031 Right lower quadrant pain: Secondary | ICD-10-CM | POA: Insufficient documentation

## 2015-12-03 DIAGNOSIS — F319 Bipolar disorder, unspecified: Secondary | ICD-10-CM | POA: Insufficient documentation

## 2015-12-03 DIAGNOSIS — K623 Rectal prolapse: Secondary | ICD-10-CM | POA: Insufficient documentation

## 2015-12-03 DIAGNOSIS — K5900 Constipation, unspecified: Secondary | ICD-10-CM | POA: Insufficient documentation

## 2015-12-03 LAB — CBC WITH DIFFERENTIAL/PLATELET
BASOS PCT: 1 %
Basophils Absolute: 0 10*3/uL (ref 0.0–0.1)
EOS ABS: 0.2 10*3/uL (ref 0.0–0.7)
Eosinophils Relative: 4 %
HCT: 32.6 % — ABNORMAL LOW (ref 39.0–52.0)
HEMOGLOBIN: 10.6 g/dL — AB (ref 13.0–17.0)
Lymphocytes Relative: 29 %
Lymphs Abs: 1.8 10*3/uL (ref 0.7–4.0)
MCH: 25.2 pg — ABNORMAL LOW (ref 26.0–34.0)
MCHC: 32.5 g/dL (ref 30.0–36.0)
MCV: 77.4 fL — ABNORMAL LOW (ref 78.0–100.0)
MONOS PCT: 10 %
Monocytes Absolute: 0.6 10*3/uL (ref 0.1–1.0)
NEUTROS PCT: 56 %
Neutro Abs: 3.6 10*3/uL (ref 1.7–7.7)
Platelets: 239 10*3/uL (ref 150–400)
RBC: 4.21 MIL/uL — AB (ref 4.22–5.81)
RDW: 19.6 % — ABNORMAL HIGH (ref 11.5–15.5)
WBC: 6.4 10*3/uL (ref 4.0–10.5)

## 2015-12-03 LAB — URINALYSIS, ROUTINE W REFLEX MICROSCOPIC
Bilirubin Urine: NEGATIVE
Glucose, UA: NEGATIVE mg/dL
Ketones, ur: NEGATIVE mg/dL
LEUKOCYTES UA: NEGATIVE
NITRITE: NEGATIVE
PH: 6 (ref 5.0–8.0)
Protein, ur: NEGATIVE mg/dL
SPECIFIC GRAVITY, URINE: 1.026 (ref 1.005–1.030)

## 2015-12-03 LAB — COMPREHENSIVE METABOLIC PANEL
ALBUMIN: 4 g/dL (ref 3.5–5.0)
ALK PHOS: 40 U/L (ref 38–126)
ALT: 14 U/L — ABNORMAL LOW (ref 17–63)
ANION GAP: 6 (ref 5–15)
AST: 16 U/L (ref 15–41)
BUN: 14 mg/dL (ref 6–20)
CALCIUM: 8.9 mg/dL (ref 8.9–10.3)
CO2: 24 mmol/L (ref 22–32)
Chloride: 111 mmol/L (ref 101–111)
Creatinine, Ser: 0.67 mg/dL (ref 0.61–1.24)
GFR calc Af Amer: >60 mL/min (ref >60)
GFR calc non Af Amer: >60 mL/min (ref >60)
GLUCOSE: 102 mg/dL — AB (ref 65–99)
POTASSIUM: 3.7 mmol/L (ref 3.5–5.1)
SODIUM: 141 mmol/L (ref 135–145)
Total Bilirubin: 0.2 mg/dL — ABNORMAL LOW (ref 0.3–1.2)
Total Protein: 6.7 g/dL (ref 6.5–8.1)

## 2015-12-03 LAB — URINE MICROSCOPIC-ADD ON
Bacteria, UA: NONE SEEN
WBC, UA: NONE SEEN WBC/hpf (ref 0–5)

## 2015-12-03 LAB — LIPASE, BLOOD: Lipase: 49 U/L (ref 11–51)

## 2015-12-03 MED ORDER — FENTANYL CITRATE (PF) 100 MCG/2ML IJ SOLN
100.0000 ug | Freq: Once | INTRAMUSCULAR | Status: AC
  Administered 2015-12-03: 100 ug via INTRAVENOUS
  Filled 2015-12-03: qty 2

## 2015-12-03 MED ORDER — DOCUSATE SODIUM 100 MG PO CAPS: 100.0000 mg | ORAL_CAPSULE | Freq: Two times a day (BID) | ORAL | Status: DC

## 2015-12-03 MED ORDER — IOPAMIDOL (ISOVUE-300) INJECTION 61%
100.0000 mL | Freq: Once | INTRAVENOUS | Status: AC | PRN
  Administered 2015-12-03: 100 mL via INTRAVENOUS

## 2015-12-03 MED ORDER — BENZONATATE 100 MG PO CAPS
100.0000 mg | ORAL_CAPSULE | Freq: Three times a day (TID) | ORAL | Status: DC
Start: 2015-12-03 — End: 2015-12-28

## 2015-12-03 MED ORDER — MORPHINE SULFATE (PF) 2 MG/ML IV SOLN
2.0000 mg | Freq: Once | INTRAVENOUS | Status: AC
  Administered 2015-12-03: 2 mg via INTRAVENOUS
  Filled 2015-12-03: qty 1

## 2015-12-03 MED ORDER — KETOROLAC TROMETHAMINE 30 MG/ML IJ SOLN
30.0000 mg | Freq: Once | INTRAMUSCULAR | Status: DC
  Filled 2015-12-03: qty 1

## 2015-12-03 MED ORDER — DIATRIZOATE MEGLUMINE & SODIUM 66-10 % PO SOLN
30.0000 mL | Freq: Once | ORAL | Status: AC
  Administered 2015-12-03: 30 mL via ORAL

## 2015-12-03 NOTE — ED Notes (Signed)
Patient ambulatory to restroom  ?

## 2015-12-03 NOTE — ED Notes (Signed)
Patient returned from X-ray 

## 2015-12-03 NOTE — ED Notes (Signed)
Bed: WA17 Expected date: 12/03/15 Expected time:  Means of arrival:  Comments: Hold Abd pain

## 2015-12-03 NOTE — ED Notes (Signed)
Per EMS patient from home.  C/O abdominal pain and constipation.  Per EMS patient strained on the toilet tonight and his "colon popped out".  Patient with Hx of GI issues and [facility].  VS en route BP 130/84, PR 84.

## 2015-12-03 NOTE — ED Notes (Signed)
Patient currently having BM.  Patient is on bedpan.  On assessment there is visual of about 1 inch of rectum.  This RN will attempt to put rectum back.

## 2015-12-03 NOTE — ED Notes (Signed)
Pt requested to speak to CN again.  Pt states EDP ordered toradol which is listed as his allergy. Primary nurse explained to him that it was listed as med does not work, therefore EDP okayed for it to administered.  He states that it needs to be changed because it makes him feel "jittery and shaky" - that it is not good for his stomach.  Explained to pt that when this nurse spoke to EDP that they are only willing to give him this medicine.  Pt became upset and started to say that this ED does not care about him, that dogs in the pound and prisoners are treated better that he is.  States "you're basically making me go and buy heroin, it's cheaper.  And then I'm gonna die and nobody cares."

## 2015-12-03 NOTE — ED Notes (Signed)
Went in to speak to pt and family member since pt is refusing to leave after Engineer, materialsdischarge-per Katie RN.  They report that pt was discharged from his PCP [facility] and is not able to fill rxs d/t financial situation.  While attempting to explain to pt and family what other options they have, family member kept yelling at this nurse while speaking to them.  Explained to them that this nurse will not tolerate them yelling.  Pt states "I am not yelling she is, and points at his girlfriend."  Asked family member to step out.  Explained to pt that the EDP have exhausted his resources and while he feels that his situation is an emergency, the EDP evaluates pt to determine whether it is life-threatening or not.  Advised pt to f/u with GI.  Pt explained that he does not have insurance,therefore he cannot f/u with a specialist.  He then states that he is being treated this way because he does not have insurance.  He then states that he is going to ride the bus home and probably won't be able to sit d/t pain.  Told pt that this nurse can ask EDP if he is willing to give an order for more pain med prior to pt leaving.  Pt agreed.  Pt apologized for his behavior initially to this nurse.

## 2015-12-03 NOTE — ED Provider Notes (Signed)
30 year old male who presents with abdominal pain and rectal prolapse. Nursing staff has notified me that the patient keep bearing down in the room and they have had to repeatedly reduce his rectal prolapse. They have asked him to reduce it on his own and he is refusing stating that "nurses need to do their jobs". When I asked what he will do when he is at home he is stating that "when I'm at home that's different". He states he will be unable to go to a general surgeon due to lack of insurance. When explained that we would like to get a CT to r/o intraabdominal pathology he states he is refusing until the nurses reduce his prolapse. They are unhappy with what I have offered them, and are requesting another provider.  Bethel BornKelly Marie Sheril Hammond, PA-C 12/03/15 0813  Melene Planan Floyd, DO 12/03/15 [phone]

## 2015-12-03 NOTE — ED Notes (Addendum)
Attempt to administer Toradol as ordered; pt refuses related to allergy (per allergy note "pt states it doesn't help, doesn't want it"), [facility]. Floyd verbalizes offer Toradol if pt refuses continue to discharge and have escorted out with security if continues to leave.

## 2015-12-03 NOTE — ED Provider Notes (Signed)
CSN: [id number]     Arrival date & time 12/03/15  0011 History   First MD Initiated Contact with Patient 12/03/15 0124     Chief Complaint  Patient presents with  . Constipation  . Abdominal Pain   HPI  Levi Palmer is a 30 year old male with PMHx of anxiety, bipolar, cirrhosis, IBS with constipation and anemia presenting with concern for rectal prolapse. Patient states he began having bilateral lower abdominal cramping pain earlier today. He felt as if he was constipated and attempted to have a bowel movement. He states that his rectum then prolapsed and he was unable to push it back in. The sensation of constipation is persistent; he states that he feels he's constantly trying to pass a large stool. Denies associated nausea or vomiting. He states he has not been able to take any pain medications because he is out of his tylenol #3. Denies history of rectal prolapse. Does note that he recently started Lyrica 2 months ago and was told rectal prolapse is possible side effect. He is followed by [facility] gastroenterology for IBS with constipation. Reports compliance with his linzess. No other complaints today.   Past Medical History  Diagnosis Date  . Medical history non-contributory   . Anxiety   . Panic attack as reaction to stress   . Anemia   . Depression   . Gallstones   . Dysrhythmia     irregular heart rate - due to zoloft  . Bipolar disorder (HCC)   . Cirrhosis (HCC)   . Constipation    Past Surgical History  Procedure Laterality Date  . Cholecystectomy    . Tonsillectomy    . Incise and drain abcess    . Mouth surgery    . Esophagogastroduodenoscopy N/A 05/22/2015    Procedure: ESOPHAGOGASTRODUODENOSCOPY (EGD);  Surgeon: Vida Rigger, MD;  Location: [facility] ENDOSCOPY;  Service: Endoscopy;  Laterality: N/A;   Family History  Problem Relation Age of Onset  . Irritable bowel syndrome Mother   . Kidney disease Mother   . Ulcerative colitis Maternal Grandfather   . Colon cancer Neg Hx    . Colon polyps Neg Hx   . Gallbladder disease Neg Hx   . Esophageal cancer Neg Hx   . Diabetes Neg Hx    Social History  Substance Use Topics  . Smoking status: Never Smoker   . Smokeless tobacco: Never Used  . Alcohol Use: No    Review of Systems  All other systems reviewed and are negative.     Allergies  Reglan; Toradol; Tramadol; and Zoloft  Home Medications   Prior to Admission medications   Medication Sig Start Date End Date Taking? Authorizing Provider  acetaminophen-codeine (TYLENOL #3) 300-30 MG tablet Take 1 tablet by mouth every 6 (six) hours as needed for moderate pain. 11/15/15  Yes Josalyn Funches, MD  benzonatate (TESSALON PERLES) 100 MG capsule Take 1 capsule (100 mg total) by mouth 3 (three) times daily as needed for cough. 11/16/15  Yes Josalyn Funches, MD  clonazePAM (KLONOPIN) 1 MG tablet Take 1 tablet (1 mg total) by mouth 2 (two) times daily. 11/01/15  Yes Josalyn Funches, MD  clotrimazole (MYCELEX) 10 MG troche Take 1 tablet (10 mg total) by mouth 5 (five) times daily. 11/27/15  Yes Arby Barrette, MD  diphenhydrAMINE (BENADRYL) 25 mg capsule Take 50 mg by mouth at bedtime as needed for sleep.   Yes Historical Provider, MD  HYDROmorphone (DILAUDID) 4 MG tablet Take 1 tablet (4 mg  total) by mouth every 6 (six) hours as needed for moderate pain or severe pain. 11/13/15  Yes Bethann BerkshireJoseph Zammit, MD  HYDROmorphone (DILAUDID) 4 MG tablet Take 1 tablet (4 mg total) by mouth every 6 (six) hours as needed for severe pain. 11/13/15  Yes Bethann BerkshireJoseph Zammit, MD  ibuprofen (ADVIL,MOTRIN) 200 MG tablet Take 400-800 mg by mouth every 6 (six) hours as needed for moderate pain.    Yes Historical Provider, MD  LINZESS 290 MCG CAPS capsule Take 290 mcg by mouth daily. 07/16/15  Yes Historical Provider, MD  PHENADOZ 25 MG suppository Place 1 suppository rectally as needed. constipation 11/14/15  Yes Historical Provider, MD  predniSONE (DELTASONE) 20 MG tablet Take 2 tablets (40 mg total) by mouth  daily with breakfast. 11/13/15  Yes Josalyn Funches, MD  pregabalin (LYRICA) 50 MG capsule Take 1 capsule (50 mg total) by mouth 3 (three) times daily. 09/20/15  Yes Josalyn Funches, MD  promethazine (PHENERGAN) 25 MG tablet Take 25 mg by mouth every 8 (eight) hours as needed. 10/25/15  Yes Historical Provider, MD  sucralfate (CARAFATE) 1 GM/10ML suspension Take 10 mLs (1 g total) by mouth 4 (four) times daily -  with meals and at bedtime. 11/27/15  Yes Arby BarretteMarcy Pfeiffer, MD  Vitamin D, Ergocalciferol, (DRISDOL) 50000 units CAPS capsule Take 1 capsule (50,000 Units total) by mouth every 7 (seven) days. For 8 weeks 09/20/15  Yes Josalyn Funches, MD  benzonatate (TESSALON) 100 MG capsule Take 1 capsule (100 mg total) by mouth every 8 (eight) hours. 12/03/15   Bethel BornKelly Marie Gekas, PA-C  docusate sodium (COLACE) 100 MG capsule Take 1 capsule (100 mg total) by mouth every 12 (twelve) hours. 12/03/15   Bethel BornKelly Marie Gekas, PA-C   BP 141/99 mmHg  Pulse 78  Temp(Src) 98.1 F (36.7 C) (Oral)  Resp 16  SpO2 100% Physical Exam  Constitutional: He appears well-developed and well-nourished. No distress.  Pt grunting and groaning in pain on stretcher  HENT:  Head: Normocephalic and atraumatic.  Eyes: Conjunctivae are normal. Right eye exhibits no discharge. Left eye exhibits no discharge. No scleral icterus.  Neck: Normal range of motion.  Cardiovascular: Normal rate, regular rhythm and normal heart sounds.   Pulmonary/Chest: Effort normal and breath sounds normal. No respiratory distress.  Abdominal: Soft. Bowel sounds are normal. He exhibits no distension. There is tenderness. There is guarding. There is no rebound.  TTP of bilateral lower quadrants with voluntary, distractible guarding. No rebound. Normoactive bowel sounds.  Genitourinary:  Exam chaperoned by nurse. Rectal prolapse present. Easily reduced with gentle pressure. No further prolapse  Musculoskeletal: Normal range of motion.  Neurological: He is  alert. Coordination normal.  Skin: Skin is warm and dry.  Psychiatric: He has a normal mood and affect. His behavior is normal.  Nursing note and vitals reviewed.   ED Course  Procedures (including critical care time) Labs Review Labs Reviewed  CBC WITH DIFFERENTIAL/PLATELET - Abnormal; Notable for the following:    RBC 4.21 (*)    Hemoglobin 10.6 (*)    HCT 32.6 (*)    MCV 77.4 (*)    MCH 25.2 (*)    RDW 19.6 (*)    All other components within normal limits  COMPREHENSIVE METABOLIC PANEL - Abnormal; Notable for the following:    Glucose, Bld 102 (*)    ALT 14 (*)    Total Bilirubin 0.2 (*)    All other components within normal limits  URINALYSIS, ROUTINE W REFLEX MICROSCOPIC (NOT AT  [facility]) - Abnormal; Notable for the following:    Hgb urine dipstick TRACE (*)    All other components within normal limits  URINE MICROSCOPIC-ADD ON - Abnormal; Notable for the following:    Squamous Epithelial / LPF 0-5 (*)    All other components within normal limits  LIPASE, BLOOD    Imaging Review Dg Abd 1 View  12/03/2015  CLINICAL DATA:  Acute onset of left lower quadrant abdominal pain and constipation. Rectal prolapse. Initial encounter. EXAM: ABDOMEN - 1 VIEW COMPARISON:  Abdominal radiograph performed 11/26/2015 FINDINGS: The visualized bowel gas pattern is unremarkable. Scattered air and stool filled loops of colon are seen; no abnormal dilatation of small bowel loops is seen to suggest small bowel obstruction. No free intra-abdominal air is identified, though evaluation for free air is limited on a single supine view. The visualized osseous structures are within normal limits; the sacroiliac joints are unremarkable in appearance. Clips are noted within the right upper quadrant, reflecting prior cholecystectomy. IMPRESSION: Unremarkable bowel gas pattern; no free intra-abdominal air seen. Small amount of stool noted in the colon. Electronically Signed   By: Roanna Raider M.D.   On: 12/03/2015  03:51   Ct Abdomen Pelvis W Contrast  12/03/2015  CLINICAL DATA:  Abdominal pain and history of intussusception and rectal prolapse EXAM: CT ABDOMEN AND PELVIS WITH CONTRAST TECHNIQUE: Multidetector CT imaging of the abdomen and pelvis was performed using the standard protocol following bolus administration of intravenous contrast. CONTRAST:  ISOVUE-300 IOPAMIDOL (ISOVUE-300) INJECTION 61% COMPARISON:  11/26/2015 FINDINGS: The lung bases are free of acute infiltrate or sizable effusion. The liver demonstrates a small hypodensity likely representing a cyst in the right lobe. The gallbladder has been surgically removed. The spleen, adrenal glands and pancreas are within normal limits. The kidneys are well visualized bilaterally. A single nonobstructing left renal stone is noted. Bowel contents demonstrate a nonobstructive pattern. Mild fecal material is noted within the colon. The bladder is well distended. No pelvic mass lesion is seen. Some fullness is noted in the region of the anus which may be related to the patient's known rectal prolapse. No acute bony abnormality is noted. IMPRESSION: No acute abnormality noted. Electronically Signed   By: Alcide Clever M.D.   On: 12/03/2015 08:39   I have personally reviewed and evaluated these images and lab results as part of my medical decision-making.   EKG Interpretation None      MDM   Final diagnoses:  Abdominal pain  Rectal prolapse   30 year old male presenting with abdominal pain and rectal prolapse. Afebrile and hemodynamically stable. Abdomen is soft with tenderness in the bilateral lower quadrants and voluntary guarding. Pt's pain is easily distractible. Rectal prolapse noted on GU exam. Prolapse reduced easily with gentle pressure. No further prolapse. After reduction, pt is still reporting severe lower abdominal pain. No change in abdominal exam. No leukocytosis. CMP unremarkable. UA without infection. DG abd shows small amount of stool  in colon without free air. Given pt's significant TTP on exam, will get CT abdomen for evaluation. Anticipate discharge home with GI and surgical follow up if negative. Patient care signed out to oncoming provider pending CT results.     Alveta Heimlich, PA-C 12/03/15 1505  Dione Booze, MD 12/03/15 2249

## 2015-12-04 ENCOUNTER — Encounter: Payer: Self-pay | Admitting: Family Medicine

## 2015-12-04 ENCOUNTER — Encounter: Payer: Self-pay | Admitting: Internal Medicine

## 2015-12-10 ENCOUNTER — Encounter (HOSPITAL_COMMUNITY): Payer: Self-pay | Admitting: Emergency Medicine

## 2015-12-10 ENCOUNTER — Emergency Department (HOSPITAL_COMMUNITY)
Admission: EM | Admit: 2015-12-10 | Discharge: 2015-12-10 | Disposition: A | Discharge status: Home / Self Care | Payer: No Typology Code available for payment source | Attending: Emergency Medicine | Admitting: Emergency Medicine

## 2015-12-10 DIAGNOSIS — G8929 Other chronic pain: Secondary | ICD-10-CM | POA: Insufficient documentation

## 2015-12-10 DIAGNOSIS — K623 Rectal prolapse: Secondary | ICD-10-CM | POA: Insufficient documentation

## 2015-12-10 DIAGNOSIS — M542 Cervicalgia: Secondary | ICD-10-CM | POA: Insufficient documentation

## 2015-12-10 DIAGNOSIS — Z862 Personal history of diseases of the blood and blood-forming organs and certain disorders involving the immune mechanism: Secondary | ICD-10-CM | POA: Insufficient documentation

## 2015-12-10 DIAGNOSIS — Z7952 Long term (current) use of systemic steroids: Secondary | ICD-10-CM | POA: Insufficient documentation

## 2015-12-10 DIAGNOSIS — F319 Bipolar disorder, unspecified: Secondary | ICD-10-CM | POA: Insufficient documentation

## 2015-12-10 DIAGNOSIS — F410 Panic disorder [episodic paroxysmal anxiety] without agoraphobia: Secondary | ICD-10-CM | POA: Insufficient documentation

## 2015-12-10 DIAGNOSIS — Z79899 Other long term (current) drug therapy: Secondary | ICD-10-CM | POA: Insufficient documentation

## 2015-12-10 DIAGNOSIS — Z8679 Personal history of other diseases of the circulatory system: Secondary | ICD-10-CM | POA: Insufficient documentation

## 2015-12-10 MED ORDER — BUPIVACAINE HCL (PF) 0.5 % IJ SOLN
10.0000 mL | Freq: Once | INTRAMUSCULAR | Status: AC
  Administered 2015-12-10: 10 mL
  Filled 2015-12-10 (×2): qty 10

## 2015-12-10 NOTE — ED Notes (Signed)
Pt is in stable condition upon d/c. Pt is upset and states, "Why is no one going to do anything about my rectum, that's why I came here". Pt has been seen previously for the same complaint and hasn't kept f/u appts. Pt again educated on sickle cell clinic and shown on his d/c papers where the number and address were located for the sickle cell clinic. Pt is still hesitant to leave. Dr. Clydene PughKnott informed.

## 2015-12-10 NOTE — ED Notes (Signed)
Pt here for c/o chronic neck stiffness/pain x "months". Reports h/o the same. States he has "bulging discs" and that he "sees ortho, but they never do anything about it". Also, c/o "rectum falling out". Shows this RN picture on cell phone of what appears to be prolapsed rectum. Reports h/o the same x 1 week, approximately. Denies bleeding. Denies fever.

## 2015-12-10 NOTE — ED Provider Notes (Signed)
CSN: [id number]     Arrival date & time 12/10/15  7829 History   First MD Initiated Contact with Patient 12/10/15 [phone]     Chief Complaint  Patient presents with  . Torticollis  . Rectal Problems     (Consider location/radiation/quality/duration/timing/severity/associated sxs/prior Treatment) Patient is a 30 y.o. male presenting with neck injury. The history is provided by the patient.  Neck Injury This is a new problem. The current episode started 1 to 2 hours ago. The problem occurs constantly. The problem has not changed since onset.Pertinent negatives include no chest pain, no abdominal pain and no shortness of breath. Nothing aggravates the symptoms. Nothing relieves the symptoms. Treatments tried: ibuprofen. The treatment provided no relief.    Past Medical History  Diagnosis Date  . Medical history non-contributory   . Anxiety   . Panic attack as reaction to stress   . Anemia   . Depression   . Gallstones   . Dysrhythmia     irregular heart rate - due to zoloft  . Bipolar disorder (HCC)   . Cirrhosis (HCC)   . Constipation    Past Surgical History  Procedure Laterality Date  . Cholecystectomy    . Tonsillectomy    . Incise and drain abcess    . Mouth surgery    . Esophagogastroduodenoscopy N/A 05/22/2015    Procedure: ESOPHAGOGASTRODUODENOSCOPY (EGD);  Surgeon: Vida Rigger, MD;  Location: [facility] ENDOSCOPY;  Service: Endoscopy;  Laterality: N/A;   Family History  Problem Relation Age of Onset  . Irritable bowel syndrome Mother   . Kidney disease Mother   . Ulcerative colitis Maternal Grandfather   . Colon cancer Neg Hx   . Colon polyps Neg Hx   . Gallbladder disease Neg Hx   . Esophageal cancer Neg Hx   . Diabetes Neg Hx    Social History  Substance Use Topics  . Smoking status: Never Smoker   . Smokeless tobacco: Never Used  . Alcohol Use: No    Review of Systems  Respiratory: Negative for shortness of breath.   Cardiovascular: Negative for chest pain.    Gastrointestinal: Negative for abdominal pain.  All other systems reviewed and are negative.     Allergies  Reglan; Toradol; Tramadol; and Zoloft  Home Medications   Prior to Admission medications   Medication Sig Start Date End Date Taking? Authorizing Provider  acetaminophen-codeine (TYLENOL #3) 300-30 MG tablet Take 1 tablet by mouth every 6 (six) hours as needed for moderate pain. 11/15/15   Josalyn Funches, MD  benzonatate (TESSALON PERLES) 100 MG capsule Take 1 capsule (100 mg total) by mouth 3 (three) times daily as needed for cough. 11/16/15   Josalyn Funches, MD  benzonatate (TESSALON) 100 MG capsule Take 1 capsule (100 mg total) by mouth every 8 (eight) hours. 12/03/15   Bethel Born, PA-C  clonazePAM (KLONOPIN) 1 MG tablet Take 1 tablet (1 mg total) by mouth 2 (two) times daily. 11/01/15   Josalyn Funches, MD  clotrimazole (MYCELEX) 10 MG troche Take 1 tablet (10 mg total) by mouth 5 (five) times daily. 11/27/15   Arby Barrette, MD  diphenhydrAMINE (BENADRYL) 25 mg capsule Take 50 mg by mouth at bedtime as needed for sleep.    Historical Provider, MD  docusate sodium (COLACE) 100 MG capsule Take 1 capsule (100 mg total) by mouth every 12 (twelve) hours. 12/03/15   Bethel Born, PA-C  HYDROmorphone (DILAUDID) 4 MG tablet Take 1 tablet (4 mg total) by mouth  every 6 (six) hours as needed for moderate pain or severe pain. 11/13/15   Bethann BerkshireJoseph Zammit, MD  HYDROmorphone (DILAUDID) 4 MG tablet Take 1 tablet (4 mg total) by mouth every 6 (six) hours as needed for severe pain. 11/13/15   Bethann BerkshireJoseph Zammit, MD  ibuprofen (ADVIL,MOTRIN) 200 MG tablet Take 400-800 mg by mouth every 6 (six) hours as needed for moderate pain.     Historical Provider, MD  LINZESS 290 MCG CAPS capsule Take 290 mcg by mouth daily. 07/16/15   Historical Provider, MD  PHENADOZ 25 MG suppository Place 1 suppository rectally as needed. constipation 11/14/15   Historical Provider, MD  predniSONE (DELTASONE) 20 MG tablet Take  2 tablets (40 mg total) by mouth daily with breakfast. 11/13/15   Dessa PhiJosalyn Funches, MD  pregabalin (LYRICA) 50 MG capsule Take 1 capsule (50 mg total) by mouth 3 (three) times daily. 09/20/15   Josalyn Funches, MD  promethazine (PHENERGAN) 25 MG tablet Take 25 mg by mouth every 8 (eight) hours as needed. 10/25/15   Historical Provider, MD  sucralfate (CARAFATE) 1 GM/10ML suspension Take 10 mLs (1 g total) by mouth 4 (four) times daily -  with meals and at bedtime. 11/27/15   Arby BarretteMarcy Pfeiffer, MD  Vitamin D, Ergocalciferol, (DRISDOL) 50000 units CAPS capsule Take 1 capsule (50,000 Units total) by mouth every 7 (seven) days. For 8 weeks 09/20/15   Josalyn Funches, MD   BP 155/105 mmHg  Pulse 96  Temp(Src) 97.7 F (36.5 C) (Oral)  Resp 22  SpO2 100% Physical Exam  Constitutional: He is oriented to person, place, and time. He appears well-developed and well-nourished. No distress.  HENT:  Head: Normocephalic and atraumatic.  Eyes: Conjunctivae are normal.  Neck: Normal range of motion. Neck supple. Muscular tenderness (on right) present. No spinous process tenderness present. No tracheal deviation present.    Cardiovascular: Normal rate and regular rhythm.   Pulmonary/Chest: Effort normal. No respiratory distress.  Abdominal: Soft. He exhibits no distension.  Neurological: He is alert and oriented to person, place, and time.  Skin: Skin is warm and dry.  Psychiatric: He has a normal mood and affect.  Vitals reviewed.   ED Course  Procedures (including critical care time)  Procedure Note: Trigger Point Injection for Myofascial pain  Performed by Dr. Clydene PughKnott Indication: muscle/myofascial pain Muscle body and tendon sheath of the right cervical paraspinal muscle(s) were injected with 0.5% bupivacaine under sterile technique for release of muscle spasm/pain. Patient tolerated well with immediate improvement of symptoms and no immediate complications following procedure.  CPT Code:   1 or 2  muscle bodies: 20552  Labs Review Labs Reviewed - No data to display  Imaging Review No results found. I have personally reviewed and evaluated these images and lab results as part of my medical decision-making.   EKG Interpretation None      MDM   Final diagnoses:  Chronic neck pain    30 year old male presents with acute on chronic right-sided neck pain that was worse when he woke up today. He is also complaining of a chronic intermittent rectal prolapse that he is able to reduce without any difficulty. He does complain of ongoing pain related to this and has multiple chronic pain complaints, 21 ED visits without an admission. Multiple negative imaging studies recently. Patient needs to establish primary care in the area and was provided contact information to do so. Case management got him an appointment in sickle cell clinic to establish routine health maintenance option and  decrease ED utilization.    Lyndal Pulley, MD 12/10/15 [phone]

## 2015-12-10 NOTE — Discharge Instructions (Signed)

## 2015-12-10 NOTE — ED Notes (Signed)
MD Knott at the bedside   

## 2015-12-11 MED FILL — predniSONE 5 MG TABS: 5 | 6 days supply | Qty: 30 | Fill #0

## 2015-12-11 MED FILL — CLOTRIMAZOLE 10MG TROCHE rx: 10 | 14 days supply | Qty: 70 | Fill #0

## 2015-12-11 MED FILL — !LINZESS 290 MCG CAPSULE: 290 | 30 days supply | Qty: 30 | Fill #1

## 2015-12-11 MED FILL — PROMETHAZINE 25 MG TABLET: 25 | 10 days supply | Qty: 30 | Fill #0

## 2015-12-12 ENCOUNTER — Ambulatory Visit: Payer: Self-pay | Admitting: General Surgery

## 2015-12-12 ENCOUNTER — Encounter: Payer: Self-pay | Admitting: Family Medicine

## 2015-12-17 ENCOUNTER — Telehealth: Payer: Self-pay | Admitting: Hematology

## 2015-12-17 ENCOUNTER — Telehealth: Payer: Self-pay | Admitting: General Practice

## 2015-12-17 ENCOUNTER — Ambulatory Visit: Payer: Self-pay | Admitting: Family Medicine

## 2015-12-17 NOTE — Telephone Encounter (Signed)
Patient called in very irate and using profanity.  Demanded to speak with someone in management.  Patient states he is not sure why was discharged from practice, and wanted answers.  Patient states he never received a letter.  I explained to patient that he should contact [facility] if he had questions.  Patient states that nobody there will talk to him.  We discussed his behavior and attitude on the phone.  Patient states he would stop the profanity, calm down and try to call their office.

## 2015-12-17 NOTE — Telephone Encounter (Signed)
Patient called in, expressing anger and frustration and questioned why he was discharged from practice and also questioned why he's being denied appointments at other practices. Patient stated that he was supposed to be contacted by Print production [facility]ice Manager [facility], but however, patient stated that he has not been contacted. Patient is requesting that he be contacted today. Patient stated that if he is not contacted he will come in person.

## 2015-12-20 ENCOUNTER — Encounter (HOSPITAL_COMMUNITY): Payer: Self-pay | Admitting: Emergency Medicine

## 2015-12-20 ENCOUNTER — Emergency Department (HOSPITAL_COMMUNITY)
Admission: EM | Admit: 2015-12-20 | Discharge: 2015-12-20 | Disposition: A | Discharge status: Home / Self Care | Payer: No Typology Code available for payment source | Attending: Emergency Medicine | Admitting: Emergency Medicine

## 2015-12-20 DIAGNOSIS — F1290 Cannabis use, unspecified, uncomplicated: Secondary | ICD-10-CM | POA: Insufficient documentation

## 2015-12-20 DIAGNOSIS — R1013 Epigastric pain: Secondary | ICD-10-CM | POA: Insufficient documentation

## 2015-12-20 DIAGNOSIS — F319 Bipolar disorder, unspecified: Secondary | ICD-10-CM | POA: Insufficient documentation

## 2015-12-20 DIAGNOSIS — R110 Nausea: Secondary | ICD-10-CM | POA: Insufficient documentation

## 2015-12-20 DIAGNOSIS — Z79899 Other long term (current) drug therapy: Secondary | ICD-10-CM | POA: Insufficient documentation

## 2015-12-20 LAB — COMPREHENSIVE METABOLIC PANEL
ALT: 18 U/L (ref 17–63)
AST: 19 U/L (ref 15–41)
Albumin: 4.9 g/dL (ref 3.5–5.0)
Alkaline Phosphatase: 53 U/L (ref 38–126)
Anion gap: 7 (ref 5–15)
BUN: 25 mg/dL — ABNORMAL HIGH (ref 6–20)
CHLORIDE: 106 mmol/L (ref 101–111)
CO2: 23 mmol/L (ref 22–32)
Calcium: 9.3 mg/dL (ref 8.9–10.3)
Creatinine, Ser: 0.7 mg/dL (ref 0.61–1.24)
Glucose, Bld: 99 mg/dL (ref 65–99)
POTASSIUM: 3.9 mmol/L (ref 3.5–5.1)
Sodium: 136 mmol/L (ref 135–145)
Total Bilirubin: 0.3 mg/dL (ref 0.3–1.2)
Total Protein: 8.1 g/dL (ref 6.5–8.1)

## 2015-12-20 LAB — LIPASE, BLOOD: LIPASE: 29 U/L (ref 11–51)

## 2015-12-20 MED ORDER — GI COCKTAIL ~~LOC~~
30.0000 mL | Freq: Once | ORAL | Status: AC
  Administered 2015-12-20: 30 mL via ORAL
  Filled 2015-12-20: qty 30

## 2015-12-20 NOTE — ED Provider Notes (Signed)
CSN: [id number]     Arrival date & time 12/20/15  4098 History   First MD Initiated Contact with Patient 12/20/15 [phone]     Chief Complaint  Patient presents with  . Abdominal Pain    HPI  Patient presents with concern of epigastric discomfort. Symptoms seem to began relatively suddenly about 3 days ago, after a coughing episode. Since that time the patient has a discomfort in the epigastrium, with cramping sensation. Symptoms were worse with inspiration, though there is no dyspnea. No other chest pain, one episode of lightheadedness earlier today, but no syncope. No fever, vomiting, diarrhea. Patient acknowledges a history of chronic abdominal issues, including nausea, vomiting, states that these have generally improved over the past few months as he has been taking Lyrica and Klonopin. He notes that about 10 days ago he ran out of his Klonopin, restarted 3 days ago. Over the past 3 days the symptoms have improved mildly. Patient denies other current complaints, does voice some frustration about recently being discharged from his physician's office.  Past Medical History  Diagnosis Date  . Medical history non-contributory   . Anxiety   . Panic attack as reaction to stress   . Anemia   . Depression   . Gallstones   . Dysrhythmia     irregular heart rate - due to zoloft  . Bipolar disorder (HCC)   . Cirrhosis (HCC)   . Constipation    Past Surgical History  Procedure Laterality Date  . Cholecystectomy    . Tonsillectomy    . Incise and drain abcess    . Mouth surgery    . Esophagogastroduodenoscopy N/A 05/22/2015    Procedure: ESOPHAGOGASTRODUODENOSCOPY (EGD);  Surgeon: Vida Rigger, MD;  Location: [facility] ENDOSCOPY;  Service: Endoscopy;  Laterality: N/A;   Family History  Problem Relation Age of Onset  . Irritable bowel syndrome Mother   . Kidney disease Mother   . Ulcerative colitis Maternal Grandfather   . Colon cancer Neg Hx   . Colon polyps Neg Hx   . Gallbladder disease  Neg Hx   . Esophageal cancer Neg Hx   . Diabetes Neg Hx    Social History  Substance Use Topics  . Smoking status: Never Smoker   . Smokeless tobacco: Never Used  . Alcohol Use: No    Review of Systems  Constitutional:       Per HPI, otherwise negative  HENT:       Per HPI, otherwise negative  Respiratory:       Per HPI, otherwise negative  Cardiovascular:       Per HPI, otherwise negative  Gastrointestinal: Positive for nausea. Negative for vomiting.  Endocrine:       Negative aside from HPI  Genitourinary:       Neg aside from HPI   Musculoskeletal:       Per HPI, otherwise negative  Skin: Negative.   Neurological: Negative for syncope.      Allergies  Reglan; Toradol; Tramadol; and Zoloft  Home Medications   Prior to Admission medications   Medication Sig Start Date End Date Taking? Authorizing Provider  benzonatate (TESSALON) 100 MG capsule Take 1 capsule (100 mg total) by mouth every 8 (eight) hours. 12/03/15   Bethel Born, PA-C  clonazePAM (KLONOPIN) 1 MG tablet Take 1 tablet (1 mg total) by mouth 2 (two) times daily. 11/01/15   Josalyn Funches, MD  diphenhydrAMINE (BENADRYL) 25 mg capsule Take 50 mg by mouth at bedtime as needed  for sleep.    Historical Provider, MD  docusate sodium (COLACE) 100 MG capsule Take 1 capsule (100 mg total) by mouth every 12 (twelve) hours. 12/03/15   Bethel BornKelly Marie Gekas, PA-C  ibuprofen (ADVIL,MOTRIN) 200 MG tablet Take 400-800 mg by mouth every 6 (six) hours as needed for moderate pain.     Historical Provider, MD  LINZESS 290 MCG CAPS capsule Take 290 mcg by mouth daily. 07/16/15   Historical Provider, MD  pregabalin (LYRICA) 50 MG capsule Take 1 capsule (50 mg total) by mouth 3 (three) times daily. 09/20/15   Dessa PhiJosalyn Funches, MD  promethazine (PHENERGAN) 25 MG tablet Take 25 mg by mouth every 8 (eight) hours as needed for nausea or vomiting.  10/25/15   Historical Provider, MD  Vitamin D, Ergocalciferol, (DRISDOL) 50000 units CAPS  capsule Take 1 capsule (50,000 Units total) by mouth every 7 (seven) days. For 8 weeks 09/20/15   Dessa PhiJosalyn Funches, MD   BP 111/76 mmHg  Pulse 108  Temp(Src) 98.2 F (36.8 C) (Oral)  Resp 18  SpO2 97% Physical Exam  Constitutional: He is oriented to person, place, and time. He appears well-developed. No distress.  HENT:  Head: Normocephalic and atraumatic.  Eyes: Conjunctivae and EOM are normal.  Cardiovascular: Normal rate and regular rhythm.   Pulmonary/Chest: Effort normal. No stridor. No respiratory distress.  Abdominal: He exhibits no distension. There is tenderness in the epigastric area. There is no rigidity, no rebound and no guarding.  Musculoskeletal: He exhibits no edema.  Neurological: He is alert and oriented to person, place, and time.  Skin: Skin is warm and dry.  Psychiatric: His mood appears anxious.  Nursing note and vitals reviewed.   ED Course  Procedures (including critical care time) Labs Reviewed   EMR reviewed, notable for multiple prior visits for a variety of complaints, including last visit about 10 days ago for neck pain.   On repeat eval he remains in NAD.  We discussed all findings.  MDM  Young male with multiple medical issues including chronic pain, nausea presents with new epigastric pain, nausea. Here he is hemodynamically stable. Symptoms maybe secondary to recent lapse in Klonopin. No evidence for ongoing pancreatitis, hepatobiliary dysfunction, no acute abdomen. Patient has no substantial chest pain complaints, no complete the dyspnea, and PE, ACS unlikely. We discussed the need for medication consistency, I encouraged the patient to continue his efforts to reestablish care with a local provider.   Gerhard Munchobert Charisma Charlot, MD 12/20/15 [phone]

## 2015-12-20 NOTE — Discharge Instructions (Signed)
As discussed, your evaluation today has been largely reassuring.  But, it is important that you monitor your condition carefully, and do not hesitate to return to the ED if you develop new, or concerning changes in your condition. ? ?Otherwise, please follow-up with your physician for appropriate ongoing care. ? ?

## 2015-12-20 NOTE — ED Notes (Signed)
Discharge instructions and follow up care reviewed with patient. Patient verbalized understanding. 

## 2015-12-20 NOTE — ED Notes (Signed)
MD at bedside. 

## 2015-12-20 NOTE — ED Notes (Signed)
Pt states he coughed the other day and it felt like something shifted  Pt states since then it has been hard for him to breathe  Pt states it feels like he is having spasms and causing him to feel short of breath

## 2015-12-28 ENCOUNTER — Other Ambulatory Visit: Payer: Self-pay

## 2015-12-28 ENCOUNTER — Encounter (HOSPITAL_COMMUNITY): Payer: Self-pay | Admitting: Emergency Medicine

## 2015-12-28 ENCOUNTER — Emergency Department (HOSPITAL_COMMUNITY)
Admission: EM | Admit: 2015-12-28 | Discharge: 2015-12-28 | Disposition: A | Discharge status: Home / Self Care | Payer: Self-pay | Attending: Emergency Medicine | Admitting: Emergency Medicine

## 2015-12-28 DIAGNOSIS — Z79899 Other long term (current) drug therapy: Secondary | ICD-10-CM | POA: Insufficient documentation

## 2015-12-28 DIAGNOSIS — R1013 Epigastric pain: Secondary | ICD-10-CM | POA: Insufficient documentation

## 2015-12-28 LAB — CBC WITH DIFFERENTIAL/PLATELET
BASOS PCT: 0 %
Basophils Absolute: 0 10*3/uL (ref 0.0–0.1)
EOS ABS: 0.1 10*3/uL (ref 0.0–0.7)
Eosinophils Relative: 1 %
HEMATOCRIT: 37.8 % — AB (ref 39.0–52.0)
Hemoglobin: 12.4 g/dL — ABNORMAL LOW (ref 13.0–17.0)
Lymphocytes Relative: 23 %
Lymphs Abs: 2 10*3/uL (ref 0.7–4.0)
MCH: 25.9 pg — ABNORMAL LOW (ref 26.0–34.0)
MCHC: 32.8 g/dL (ref 30.0–36.0)
MCV: 78.9 fL (ref 78.0–100.0)
MONO ABS: 0.4 10*3/uL (ref 0.1–1.0)
MONOS PCT: 5 %
Neutro Abs: 6.3 10*3/uL (ref 1.7–7.7)
Neutrophils Relative %: 71 %
Platelets: 208 10*3/uL (ref 150–400)
RBC: 4.79 MIL/uL (ref 4.22–5.81)
RDW: 19.2 % — AB (ref 11.5–15.5)
WBC: 8.7 10*3/uL (ref 4.0–10.5)

## 2015-12-28 LAB — COMPREHENSIVE METABOLIC PANEL
ALK PHOS: 45 U/L (ref 38–126)
ALT: 13 U/L — ABNORMAL LOW (ref 17–63)
ANION GAP: 11 (ref 5–15)
AST: 17 U/L (ref 15–41)
Albumin: 4.5 g/dL (ref 3.5–5.0)
BILIRUBIN TOTAL: 0.4 mg/dL (ref 0.3–1.2)
BUN: 20 mg/dL (ref 6–20)
CALCIUM: 9.4 mg/dL (ref 8.9–10.3)
CO2: 25 mmol/L (ref 22–32)
Chloride: 102 mmol/L (ref 101–111)
Creatinine, Ser: 0.97 mg/dL (ref 0.61–1.24)
GFR calc Af Amer: >60 mL/min (ref >60)
GFR calc non Af Amer: >60 mL/min (ref >60)
Glucose, Bld: 92 mg/dL (ref 65–99)
Potassium: 3.2 mmol/L — ABNORMAL LOW (ref 3.5–5.1)
Sodium: 138 mmol/L (ref 135–145)
TOTAL PROTEIN: 7.1 g/dL (ref 6.5–8.1)

## 2015-12-28 LAB — LIPASE, BLOOD: Lipase: 19 U/L (ref 11–51)

## 2015-12-28 LAB — I-STAT TROPONIN, ED: TROPONIN I, POC: 0.01 ng/mL (ref 0.00–0.08)

## 2015-12-28 MED ORDER — POTASSIUM CHLORIDE CRYS ER 20 MEQ PO TBCR
20.0000 meq | EXTENDED_RELEASE_TABLET | Freq: Once | ORAL | Status: AC
  Administered 2015-12-28: 20 meq via ORAL
  Filled 2015-12-28: qty 1

## 2015-12-28 MED ORDER — OMEPRAZOLE 20 MG PO CPDR: 20.0000 mg | DELAYED_RELEASE_CAPSULE | Freq: Every day | ORAL | Status: AC

## 2015-12-28 MED ORDER — ONDANSETRON HCL 4 MG/2ML IJ SOLN
4.0000 mg | Freq: Once | INTRAMUSCULAR | Status: AC
  Administered 2015-12-28: 4 mg via INTRAVENOUS
  Filled 2015-12-28: qty 2

## 2015-12-28 MED ORDER — SUCRALFATE 1 G PO TABS
1.0000 g | ORAL_TABLET | Freq: Once | ORAL | Status: AC
  Administered 2015-12-28: 1 g via ORAL
  Filled 2015-12-28: qty 1

## 2015-12-28 MED ORDER — SUCRALFATE 1 GM/10ML PO SUSP: 1.0000 g | Freq: Three times a day (TID) | ORAL | Status: AC

## 2015-12-28 MED ORDER — SODIUM CHLORIDE 0.9 % IV BOLUS (SEPSIS)
1000.0000 mL | Freq: Once | INTRAVENOUS | Status: AC
  Administered 2015-12-28: 1000 mL via INTRAVENOUS

## 2015-12-28 MED ORDER — GI COCKTAIL ~~LOC~~
30.0000 mL | Freq: Once | ORAL | Status: AC
  Administered 2015-12-28: 30 mL via ORAL
  Filled 2015-12-28: qty 30

## 2015-12-28 NOTE — ED Provider Notes (Signed)
CSN: [id number]     Arrival date & time 12/28/15  [id number] History   First MD Initiated Contact with Patient 12/28/15 [phone]     Chief Complaint  Patient presents with  . Abdominal Pain      Patient is a 30 y.o. male presenting with abdominal pain. The history is provided by the patient. No language interpreter was used.  Abdominal Pain Pain location:  Epigastric Pain quality: sharp and stabbing   Pain radiates to:  LUQ Pain severity:  Severe Onset quality:  Gradual Duration:  5 days Timing:  Constant Progression:  Worsening Chronicity:  Recurrent Context: not alcohol use   Context comment:  He thinks sxs may be worse since stopping his Lyrica two months ago Relieved by:  Nothing Worsened by:  Nothing tried Ineffective treatments:  None tried Associated symptoms: nausea and vomiting   Associated symptoms: no diarrhea and no fever   Risk factors: no alcohol abuse    Levi Palmer is a 30 y.o. male who presents to the Emergency Department complaining of epigastric pain.  Past Medical History  Diagnosis Date  . Medical history non-contributory   . Anxiety   . Panic attack as reaction to stress   . Anemia   . Depression   . Gallstones   . Dysrhythmia     irregular heart rate - due to zoloft  . Bipolar disorder (HCC)   . Cirrhosis (HCC)   . Constipation    Past Surgical History  Procedure Laterality Date  . Cholecystectomy    . Tonsillectomy    . Incise and drain abcess    . Mouth surgery    . Esophagogastroduodenoscopy N/A 05/22/2015    Procedure: ESOPHAGOGASTRODUODENOSCOPY (EGD);  Surgeon: Vida RiggerMarc Magod, MD;  [facility];  Service: Endoscopy;  Laterality: N/A;   Family History  Problem Relation Age of Onset  . Irritable bowel syndrome Mother   . Kidney disease Mother   . Ulcerative colitis Maternal Grandfather   . Colon cancer Neg Hx   . Colon polyps Neg Hx   . Gallbladder disease Neg Hx   . Esophageal cancer Neg Hx   . Diabetes Neg Hx    Social  History  Substance Use Topics  . Smoking status: Never Smoker   . Smokeless tobacco: Never Used  . Alcohol Use: No    Review of Systems  Constitutional: Negative for fever.  Gastrointestinal: Positive for nausea, vomiting and abdominal pain. Negative for diarrhea.  All other systems reviewed and are negative.     Allergies  Reglan; Toradol; Tramadol; and Zoloft  Home Medications   Prior to Admission medications   Medication Sig Start Date End Date Taking? Authorizing Provider  clonazePAM (KLONOPIN) 1 MG tablet Take 1 tablet (1 mg total) by mouth 2 (two) times daily. 11/01/15  Yes Josalyn Funches, MD  diphenhydrAMINE (BENADRYL) 25 mg capsule Take 50 mg by mouth at bedtime as needed for sleep.   Yes Historical Provider, MD  ibuprofen (ADVIL,MOTRIN) 200 MG tablet Take 400-800 mg by mouth every 6 (six) hours as needed for moderate pain.    Yes Historical Provider, MD  LINZESS 290 MCG CAPS capsule Take 290 mcg by mouth daily. 07/16/15  Yes Historical Provider, MD  pregabalin (LYRICA) 50 MG capsule Take 1 capsule (50 mg total) by mouth 3 (three) times daily. 09/20/15  Yes Josalyn Funches, MD  promethazine (PHENERGAN) 25 MG tablet Take 25 mg by mouth every 8 (eight) hours as needed for nausea or vomiting.  10/25/15  Yes Historical Provider, MD  omeprazole (PRILOSEC) 20 MG capsule Take 1 capsule (20 mg total) by mouth daily. 12/28/15   Tilden FossaElizabeth Karin Pinedo, MD  sucralfate (CARAFATE) 1 GM/10ML suspension Take 10 mLs (1 g total) by mouth 4 (four) times daily -  with meals and at bedtime. 12/28/15   Tilden FossaElizabeth Wendal Wilkie, MD   BP 112/72 mmHg  Pulse 64  Temp(Src) 97.7 F (36.5 C) (Oral)  Resp 18  Ht 5\' 7"  (1.702 m)  Wt 125 lb (56.7 kg)  BMI 19.57 kg/m2  SpO2 98% Physical Exam  Constitutional: He is oriented to person, place, and time. He appears well-developed and well-nourished.  HENT:  Head: Normocephalic and atraumatic.  Cardiovascular: Normal rate and regular rhythm.   No murmur  heard. Pulmonary/Chest: Effort normal and breath sounds normal. No respiratory distress.  Abdominal: Soft. There is no rebound and no guarding.  Mild to moderate epigastric tenderness  Musculoskeletal: He exhibits no edema or tenderness.  Neurological: He is alert and oriented to person, place, and time.  Skin: Skin is warm and dry.  Psychiatric: He has a normal mood and affect. His behavior is normal.  Nursing note and vitals reviewed.   ED Course  Procedures (including critical care time) Labs Review Labs Reviewed  COMPREHENSIVE METABOLIC PANEL - Abnormal; Notable for the following:    Potassium 3.2 (*)    ALT 13 (*)    All other components within normal limits  CBC WITH DIFFERENTIAL/PLATELET - Abnormal; Notable for the following:    Hemoglobin 12.4 (*)    HCT 37.8 (*)    MCH 25.9 (*)    RDW 19.2 (*)    All other components within normal limits  LIPASE, BLOOD  I-STAT TROPOININ, ED    Imaging Review No results found. I have personally reviewed and evaluated these images and lab results as part of my medical decision-making.   EKG Interpretation   Date/Time:  Friday December 28 2015 06:52:25 EDT Ventricular Rate:  83 PR Interval:    QRS Duration: 93 QT Interval:  337 QTC Calculation: 396 R Axis:   86 Text Interpretation:  Sinus rhythm RSR' in V1 or V2, right VCD or RVH  Confirmed by Lincoln Brighamees, Liz [phone]([id number]) on 12/28/2015 7:02:10 AM      MDM   Final diagnoses:  Epigastric pain    Pt with hx/o chronic abdominal pain here with recurrent epigastric pain.  He has a benign examination with no peritonitis.  Clinical picture not c/w perforated viscus, pancreatitis, bowel obstruction.  He has no vomiting in the ED.  D/w pt oral fluid hydration, clear liquid diet, GI follow up.  Treating for potential gastritis.  Pt with CT abd in May 2017, EGD in 2016.  Home care and return precautions discussed.      Tilden FossaElizabeth Hedaya Latendresse, MD 12/28/15 [phone]

## 2015-12-28 NOTE — Discharge Instructions (Signed)
The cause of your abdominal pain was not identified today.  Please follow up with your GI doctor closely for further testing.  Get rechecked immediately if you develop any new or worrisome symptoms.    Abdominal Pain, Adult Many things can cause abdominal pain. Usually, abdominal pain is not caused by a disease and will improve without treatment. It can often be observed and treated at home. Your health care provider will do a physical exam and possibly order blood tests and X-rays to help determine the seriousness of your pain. However, in many cases, more time must pass before a clear cause of the pain can be found. Before that point, your health care provider may not know if you need more testing or further treatment. HOME CARE INSTRUCTIONS Monitor your abdominal pain for any changes. The following actions may help to alleviate any discomfort you are experiencing:  Only take over-the-counter or prescription medicines as directed by your health care provider.  Do not take laxatives unless directed to do so by your health care provider.  Try a clear liquid diet (broth, tea, or water) as directed by your health care provider. Slowly move to a bland diet as tolerated. SEEK MEDICAL CARE IF:  You have unexplained abdominal pain.  You have abdominal pain associated with nausea or diarrhea.  You have pain when you urinate or have a bowel movement.  You experience abdominal pain that wakes you in the night.  You have abdominal pain that is worsened or improved by eating food.  You have abdominal pain that is worsened with eating fatty foods.  You have a fever. SEEK IMMEDIATE MEDICAL CARE IF:  Your pain does not go away within 2 hours.  You keep throwing up (vomiting).  Your pain is felt only in portions of the abdomen, such as the right side or the left lower portion of the abdomen.  You pass bloody or black tarry stools. MAKE SURE YOU:  Understand these instructions.  Will watch  your condition.  Will get help right away if you are not doing well or get worse.   This information is not intended to replace advice given to you by your health care provider. Make sure you discuss any questions you have with your health care provider.   Document Released: 04/02/2005 Document Revised: 03/14/2015 Document Reviewed: 03/02/2013 Elsevier Interactive Patient Education 2016 Elsevier Inc. Clear Liquid Diet A clear liquid diet is a short-term diet that is prescribed to provide the necessary fluid and basic energy you need when you can have nothing else. The clear liquid diet consists of liquids or solids that will become liquid at room temperature. You should be able to see through the liquid. There are many reasons that you may be restricted to clear liquids, such as:  When you have a sudden-onset (acute) condition that occurs before or after surgery.  To help your body slowly get adjusted to food again after a long period when you were unable to have food.  Replacement of fluids when you have a diarrheal disease.  When you are going to have certain exams, such as a colonoscopy, in which instruments are inserted inside your body to look at parts of your digestive system. WHAT CAN I HAVE? A clear liquid diet does not provide all the nutrients you need. It is important to choose a variety of the following items to get as many nutrients as possible:  Vegetable juices that do not have pulp.  Fruit juices and fruit drinks  that do not have pulp.  Coffee (regular or decaffeinated), tea, or soda at the discretion of your health care provider.  Clear bouillon, broth, or strained broth-based soups.  High-protein and flavored gelatins.  Sugar or honey.  Ices or frozen ice pops that do not contain milk. If you are not sure whether you can have certain items, you should ask your health care provider. You may also ask your health care provider if there are any other clear liquid  options.   This information is not intended to replace advice given to you by your health care provider. Make sure you discuss any questions you have with your health care provider.   Document Released: 06/23/2005 Document Revised: 06/28/2013 Document Reviewed: 05/20/2013 Elsevier Interactive Patient Education [employer] Elsevier Inc.

## 2015-12-28 NOTE — ED Notes (Signed)
C/o epigastric pain that started 5 days ago.  [facility].  Not better since discharge.  Also reports nausea and vomiting.  No diarrhea.

## 2016-01-04 NOTE — Pre-Procedure Instructions (Signed)
Levi GunningMichael N Palmer  01/04/2016      [facility] - [place], [place] - [address] [place] [address] Phone: [phone] Fax: [phone]    Your procedure is scheduled on Tues, July 11 @ 7:30 AM  [facility] at 5:30 AM  Call this number if you have problems the morning of surgery:  [phone]   Remember:  Do not eat food or drink liquids after midnight.  Take these medicines the morning of surgery with A SIP OF WATER Clonazepam(Klonopin),Omeprazole(Prilosec),Lyrica(Pregabalin),and Phenergan(Promethazine-if needed)             Stop taking Ibuprofen a week prior to surgery. Also No Goody's,BC's,Aleve,Advil,Motrin,Aspirin,Fish Oil,or any Herbal Medications.    Do not wear jewelry.  Do not wear lotions, powders, or colognes.               Men may shave face and neck.  Do not bring valuables to the hospital.  [facility] is not responsible for any belongings or valuables.  Contacts, dentures or bridgework may not be worn into surgery.  Leave your suitcase in the car.  After surgery it may be brought to your room.  For patients admitted to the hospital, discharge time will be determined by your treatment team.  Patients discharged the day of surgery will not be allowed to drive home.    Special instructions:  [facility] - Preparing for Surgery  Before surgery, you can play an important role.  Because skin is not sterile, your skin needs to be as free of germs as possible.  You can reduce the number of germs on you skin by washing with CHG (chlorahexidine gluconate) soap before surgery.  CHG is an antiseptic cleaner which kills germs and bonds with the skin to continue killing germs even after washing.  Please DO NOT use if you have an allergy to CHG or antibacterial soaps.  If your skin becomes reddened/irritated stop using the CHG and inform your nurse when you arrive at Short Stay.  Do not shave  (including legs and underarms) for at least 48 hours prior to the first CHG shower.  You may shave your face.  Please follow these instructions carefully:   1.  Shower with CHG Soap the night before surgery and the                                morning of Surgery.  2.  If you choose to wash your hair, wash your hair first as usual with your       normal shampoo.  3.  After you shampoo, rinse your hair and body thoroughly to remove the                      Shampoo.  4.  Use CHG as you would any other liquid soap.  You can apply chg directly       to the skin and wash gently with scrungie or a clean washcloth.  5.  Apply the CHG Soap to your body ONLY FROM THE NECK DOWN.        Do not use on open wounds or open sores.  Avoid contact with your eyes,       ears, mouth and genitals (private parts).  Wash genitals (private parts)       with your normal soap.  6.  Wash thoroughly, paying special attention to the area where your surgery        will be performed.  7.  Thoroughly rinse your body with warm water from the neck down.  8.  DO NOT shower/wash with your normal soap after using and rinsing off       the CHG Soap.  9.  Pat yourself dry with a clean towel.            10.  Wear clean pajamas.            11.  Place clean sheets on your bed the night of your first shower and do not        sleep with pets.  Day of Surgery  Do not apply any lotions/deoderants the morning of surgery.  Please wear clean clothes to the hospital/surgery center.

## 2016-01-07 ENCOUNTER — Ambulatory Visit (HOSPITAL_COMMUNITY)
Admission: RE | Admit: 2016-01-07 | Discharge: 2016-01-07 | Disposition: A | Discharge status: Home / Self Care | Payer: Self-pay | Source: Ambulatory Visit | Attending: Anesthesiology | Admitting: Anesthesiology

## 2016-01-07 ENCOUNTER — Encounter (HOSPITAL_COMMUNITY)
Admission: RE | Admit: 2016-01-07 | Discharge: 2016-01-07 | Disposition: A | Discharge status: Home / Self Care | Payer: Self-pay | Source: Ambulatory Visit | Attending: General Surgery | Admitting: General Surgery

## 2016-01-07 ENCOUNTER — Encounter (HOSPITAL_COMMUNITY): Payer: Self-pay

## 2016-01-07 DIAGNOSIS — R079 Chest pain, unspecified: Secondary | ICD-10-CM

## 2016-01-07 DIAGNOSIS — J40 Bronchitis, not specified as acute or chronic: Secondary | ICD-10-CM

## 2016-01-07 HISTORY — DX: Personal history of other diseases of the respiratory system: Z87.09

## 2016-01-07 HISTORY — DX: Interstitial emphysema: J98.2

## 2016-01-07 HISTORY — DX: Liver disease, unspecified: K76.9

## 2016-01-07 HISTORY — DX: Other specified congenital malformations of intestine: Q43.8

## 2016-01-07 HISTORY — DX: Gastro-esophageal reflux disease without esophagitis: K21.9

## 2016-01-07 LAB — COMPREHENSIVE METABOLIC PANEL
ALBUMIN: 4.8 g/dL (ref 3.5–5.0)
ALK PHOS: 45 U/L (ref 38–126)
ALT: 11 U/L — ABNORMAL LOW (ref 17–63)
ANION GAP: 8 (ref 5–15)
AST: 15 U/L (ref 15–41)
BILIRUBIN TOTAL: 0.6 mg/dL (ref 0.3–1.2)
BUN: 12 mg/dL (ref 6–20)
CALCIUM: 9.9 mg/dL (ref 8.9–10.3)
CO2: 24 mmol/L (ref 22–32)
Chloride: 104 mmol/L (ref 101–111)
Creatinine, Ser: 0.93 mg/dL (ref 0.61–1.24)
GFR calc non Af Amer: >60 mL/min (ref >60)
GLUCOSE: 85 mg/dL (ref 65–99)
POTASSIUM: 3.9 mmol/L (ref 3.5–5.1)
SODIUM: 136 mmol/L (ref 135–145)
TOTAL PROTEIN: 7.7 g/dL (ref 6.5–8.1)

## 2016-01-07 LAB — CBC
HCT: 40.5 % (ref 39.0–52.0)
HEMOGLOBIN: 13.1 g/dL (ref 13.0–17.0)
MCH: 26.1 pg (ref 26.0–34.0)
MCHC: 32.3 g/dL (ref 30.0–36.0)
MCV: 80.7 fL (ref 78.0–100.0)
Platelets: 273 10*3/uL (ref 150–400)
RBC: 5.02 MIL/uL (ref 4.22–5.81)
RDW: 18.8 % — ABNORMAL HIGH (ref 11.5–15.5)
WBC: 6.1 10*3/uL (ref 4.0–10.5)

## 2016-01-07 NOTE — Progress Notes (Signed)
PCP - Lavinia SharpsMary Ann Placey NP - [facility] Cardiologist - denies   Chest x-ray - 01/07/16 pending EKG - 12/28/15 Stress Test - deneis ECHO - 125/15 Cardiac Cath - denies    Patient denies shortness of breath and chest pain at PAT appointment  Patient states that he has had chest pain in the past that he thinks is related to anxiety and panic attacks.    Patient was being seen at the wellness center but he stated that they told him he could no longer be seen there but was unsure of the reason why.  [facility] xray and complaints of past chest pain.  Patient also stated that 2-3 years ago he had been diagnosed with Pneumomediastnum

## 2016-01-09 ENCOUNTER — Encounter (HOSPITAL_COMMUNITY): Payer: Self-pay

## 2016-01-09 NOTE — Progress Notes (Addendum)
Anesthesia Chart Review:  Pt is a 30 year old male scheduled for exploratory laparotomy on 01/15/2016 with Violeta GelinasBurke Thompson, MD.   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 Never smoker. BMI 20  Medications include: prilosec.   Preoperative labs reviewed.    Chest x-ray reviewed.   EKG 12/28/15: Sinus rhythm. RSR' in V1 or V2, right VCD or RVH  Echo 07/31/13:  - Left ventricle: The cavity size was normal. Wall thickness was normal. Systolic function was normal. The estimated ejection fraction was in the range of 50% to 55%. Wall motion was normal; there were no regional wall motion abnormalities. Left ventricular diastolic function parameters were normal.   Pt reported at PAT that he gets chest pain about 2x/year over the last 3 years.  Always is associated with panic attacks. When panic attack resolves, so does chest pain. Last episode 2 months ago or so. No current sx. Describes activity level as walking 2 miles a day. Is able to walk and go up flight of stairs without CV or SOB sx.   If no changes, I anticipate pt can proceed with surgery as scheduled.   Rica Mastngela Kabbe, FNP-BC [facility]/Anesthesiology Phone: [phone] 01/10/2016 4:53 PM

## 2016-01-10 ENCOUNTER — Ambulatory Visit: Payer: Self-pay | Admitting: Family Medicine

## 2016-01-14 MED ORDER — CEFAZOLIN SODIUM-DEXTROSE 2-4 GM/100ML-% IV SOLN
2.0000 g | INTRAVENOUS | Status: AC
  Administered 2016-01-15: 2 g via INTRAVENOUS
  Filled 2016-01-14: qty 100

## 2016-01-14 MED ORDER — CHLORHEXIDINE GLUCONATE CLOTH 2 % EX PADS: 6.0000 | MEDICATED_PAD | Freq: Once | CUTANEOUS | Status: DC

## 2016-01-15 ENCOUNTER — Ambulatory Visit (HOSPITAL_COMMUNITY)
Admission: RE | Admit: 2016-01-15 | Discharge: 2016-01-15 | Disposition: A | Discharge status: Home / Self Care | Payer: No Typology Code available for payment source | Source: Ambulatory Visit | Attending: General Surgery | Admitting: General Surgery

## 2016-01-15 ENCOUNTER — Encounter (HOSPITAL_COMMUNITY): Payer: Self-pay | Admitting: Surgery

## 2016-01-15 ENCOUNTER — Encounter (HOSPITAL_COMMUNITY)
Admission: RE | Disposition: A | Discharge status: Home / Self Care | Payer: Self-pay | Source: Ambulatory Visit | Attending: General Surgery

## 2016-01-15 ENCOUNTER — Ambulatory Visit (HOSPITAL_COMMUNITY): Payer: Self-pay | Admitting: Anesthesiology

## 2016-01-15 ENCOUNTER — Ambulatory Visit (HOSPITAL_COMMUNITY): Payer: Self-pay | Admitting: Vascular Surgery

## 2016-01-15 DIAGNOSIS — F319 Bipolar disorder, unspecified: Secondary | ICD-10-CM | POA: Insufficient documentation

## 2016-01-15 DIAGNOSIS — K219 Gastro-esophageal reflux disease without esophagitis: Secondary | ICD-10-CM | POA: Insufficient documentation

## 2016-01-15 DIAGNOSIS — Z79899 Other long term (current) drug therapy: Secondary | ICD-10-CM | POA: Insufficient documentation

## 2016-01-15 DIAGNOSIS — R1011 Right upper quadrant pain: Secondary | ICD-10-CM | POA: Insufficient documentation

## 2016-01-15 DIAGNOSIS — K660 Peritoneal adhesions (postprocedural) (postinfection): Secondary | ICD-10-CM | POA: Insufficient documentation

## 2016-01-15 HISTORY — PX: CHOLECYSTECTOMY: SHX55

## 2016-01-15 HISTORY — PX: LAPAROSCOPIC LYSIS OF ADHESIONS: SHX5905

## 2016-01-15 SURGERY — LAPAROSCOPIC CHOLECYSTECTOMY
Anesthesia: General | Site: Abdomen

## 2016-01-15 MED ORDER — ROCURONIUM BROMIDE 50 MG/5ML IV SOLN
INTRAVENOUS | Status: AC
Start: 2016-01-15 — End: 2016-01-15
  Filled 2016-01-15: qty 1

## 2016-01-15 MED ORDER — HYDROMORPHONE HCL 1 MG/ML IJ SOLN
INTRAMUSCULAR | Status: AC
  Filled 2016-01-15: qty 1

## 2016-01-15 MED ORDER — OXYCODONE HCL 5 MG PO TABS
5.0000 mg | ORAL_TABLET | Freq: Four times a day (QID) | ORAL | Status: DC | PRN
Start: 2016-01-15 — End: 2016-01-23

## 2016-01-15 MED ORDER — PROPOFOL 10 MG/ML IV BOLUS
INTRAVENOUS | Status: DC | PRN
  Administered 2016-01-15: 200 mg via INTRAVENOUS

## 2016-01-15 MED ORDER — PHENYLEPHRINE 40 MCG/ML (10ML) SYRINGE FOR IV PUSH (FOR BLOOD PRESSURE SUPPORT)
PREFILLED_SYRINGE | INTRAVENOUS | Status: AC
  Filled 2016-01-15: qty 10

## 2016-01-15 MED ORDER — ROCURONIUM BROMIDE 100 MG/10ML IV SOLN
INTRAVENOUS | Status: DC | PRN
  Administered 2016-01-15: 20 mg via INTRAVENOUS

## 2016-01-15 MED ORDER — SUGAMMADEX SODIUM 200 MG/2ML IV SOLN
INTRAVENOUS | Status: DC | PRN
  Administered 2016-01-15: 100 mg via INTRAVENOUS

## 2016-01-15 MED ORDER — SUFENTANIL CITRATE 50 MCG/ML IV SOLN
INTRAVENOUS | Status: DC | PRN
  Administered 2016-01-15: 5 ug via INTRAVENOUS
  Administered 2016-01-15: 10 ug via INTRAVENOUS
  Administered 2016-01-15 (×3): 5 ug via INTRAVENOUS

## 2016-01-15 MED ORDER — BUPIVACAINE-EPINEPHRINE 0.25% -1:200000 IJ SOLN
INTRAMUSCULAR | Status: DC | PRN
  Administered 2016-01-15: 30 mL

## 2016-01-15 MED ORDER — PROMETHAZINE HCL 25 MG/ML IJ SOLN: 6.2500 mg | INTRAMUSCULAR | Status: DC | PRN

## 2016-01-15 MED ORDER — BUPIVACAINE-EPINEPHRINE (PF) 0.25% -1:200000 IJ SOLN
INTRAMUSCULAR | Status: AC
  Filled 2016-01-15: qty 30

## 2016-01-15 MED ORDER — SUGAMMADEX SODIUM 200 MG/2ML IV SOLN
INTRAVENOUS | Status: AC
  Filled 2016-01-15: qty 2

## 2016-01-15 MED ORDER — PROPOFOL 10 MG/ML IV BOLUS
INTRAVENOUS | Status: AC
Start: 2016-01-15 — End: 2016-01-15
  Filled 2016-01-15: qty 20

## 2016-01-15 MED ORDER — LIDOCAINE 2% (20 MG/ML) 5 ML SYRINGE
INTRAMUSCULAR | Status: AC
  Filled 2016-01-15: qty 5

## 2016-01-15 MED ORDER — SUCCINYLCHOLINE CHLORIDE 200 MG/10ML IV SOSY
PREFILLED_SYRINGE | INTRAVENOUS | Status: AC
  Filled 2016-01-15: qty 10

## 2016-01-15 MED ORDER — EPHEDRINE 5 MG/ML INJ
INTRAVENOUS | Status: AC
Start: 2016-01-15 — End: 2016-01-15
  Filled 2016-01-15: qty 10

## 2016-01-15 MED ORDER — LIDOCAINE HCL (CARDIAC) 20 MG/ML IV SOLN
INTRAVENOUS | Status: DC | PRN
  Administered 2016-01-15: 100 mg via INTRAVENOUS

## 2016-01-15 MED ORDER — ONDANSETRON HCL 4 MG/2ML IJ SOLN
INTRAMUSCULAR | Status: DC | PRN
  Administered 2016-01-15: 4 mg via INTRAVENOUS

## 2016-01-15 MED ORDER — LORAZEPAM 2 MG/ML IJ SOLN: 0.5000 mg | INTRAMUSCULAR | Status: DC | PRN

## 2016-01-15 MED ORDER — SUFENTANIL CITRATE 50 MCG/ML IV SOLN
INTRAVENOUS | Status: AC
  Filled 2016-01-15: qty 1

## 2016-01-15 MED ORDER — MIDAZOLAM HCL 5 MG/5ML IJ SOLN
INTRAMUSCULAR | Status: DC | PRN
  Administered 2016-01-15 (×2): 1 mg via INTRAVENOUS

## 2016-01-15 MED ORDER — MIDAZOLAM HCL 2 MG/2ML IJ SOLN
INTRAMUSCULAR | Status: AC
  Filled 2016-01-15: qty 2

## 2016-01-15 MED ORDER — SODIUM CHLORIDE 0.9 % IJ SOLN
INTRAMUSCULAR | Status: AC
  Filled 2016-01-15: qty 10

## 2016-01-15 MED ORDER — ONDANSETRON HCL 4 MG/2ML IJ SOLN
INTRAMUSCULAR | Status: AC
  Filled 2016-01-15: qty 2

## 2016-01-15 MED ORDER — LACTATED RINGERS IV SOLN
INTRAVENOUS | Status: DC | PRN
  Administered 2016-01-15: 07:00:00 via INTRAVENOUS

## 2016-01-15 MED ORDER — 0.9 % SODIUM CHLORIDE (POUR BTL) OPTIME
TOPICAL | Status: DC | PRN
  Administered 2016-01-15: 1000 mL

## 2016-01-15 MED ORDER — HYDROMORPHONE HCL 1 MG/ML IJ SOLN
0.2500 mg | INTRAMUSCULAR | Status: DC | PRN
  Administered 2016-01-15 (×2): 0.5 mg via INTRAVENOUS

## 2016-01-15 SURGICAL SUPPLY — 45 items
APPLIER CLIP 5 13 M/L LIGAMAX5 (MISCELLANEOUS)
BLADE SURG ROTATE 9660 (MISCELLANEOUS) ×3 IMPLANT
CANISTER SUCTION 2500CC (MISCELLANEOUS) ×3 IMPLANT
CHLORAPREP W/TINT 26ML (MISCELLANEOUS) ×3 IMPLANT
CLIP APPLIE 5 13 M/L LIGAMAX5 (MISCELLANEOUS) IMPLANT
COVER SURGICAL LIGHT HANDLE (MISCELLANEOUS) ×3 IMPLANT
DECANTER SPIKE VIAL GLASS SM (MISCELLANEOUS) ×3 IMPLANT
ELECT REM PT RETURN 9FT ADLT (ELECTROSURGICAL) ×3
ELECTRODE REM PT RTRN 9FT ADLT (ELECTROSURGICAL) ×1 IMPLANT
FILTER SMOKE EVAC LAPAROSHD (FILTER) ×3 IMPLANT
GLOVE BIO SURGEON STRL SZ8 (GLOVE) ×3 IMPLANT
GLOVE BIOGEL PI IND STRL 6.5 (GLOVE) ×1 IMPLANT
GLOVE BIOGEL PI IND STRL 7.0 (GLOVE) ×1 IMPLANT
GLOVE BIOGEL PI IND STRL 8 (GLOVE) ×1 IMPLANT
GLOVE BIOGEL PI INDICATOR 6.5 (GLOVE) ×2
GLOVE BIOGEL PI INDICATOR 7.0 (GLOVE) ×2
GLOVE BIOGEL PI INDICATOR 8 (GLOVE) ×2
GLOVE ECLIPSE 6.5 STRL STRAW (GLOVE) ×6 IMPLANT
GLOVE SURG SS PI 7.0 STRL IVOR (GLOVE) ×3 IMPLANT
GOWN STRL REUS W/ TWL LRG LVL3 (GOWN DISPOSABLE) ×2 IMPLANT
GOWN STRL REUS W/ TWL XL LVL3 (GOWN DISPOSABLE) ×1 IMPLANT
GOWN STRL REUS W/TWL LRG LVL3 (GOWN DISPOSABLE) ×4
GOWN STRL REUS W/TWL XL LVL3 (GOWN DISPOSABLE) ×2
KIT BASIN OR (CUSTOM PROCEDURE TRAY) ×3 IMPLANT
KIT ROOM TURNOVER OR (KITS) ×3 IMPLANT
L-HOOK LAP DISP 36CM (ELECTROSURGICAL) ×3
LHOOK LAP DISP 36CM (ELECTROSURGICAL) ×1 IMPLANT
LIQUID BAND (GAUZE/BANDAGES/DRESSINGS) ×3 IMPLANT
NEEDLE 22X1 1/2 (OR ONLY) (NEEDLE) ×3 IMPLANT
NS IRRIG 1000ML POUR BTL (IV SOLUTION) ×3 IMPLANT
PAD ARMBOARD 7.5X6 YLW CONV (MISCELLANEOUS) ×3 IMPLANT
PENCIL BUTTON HOLSTER BLD 10FT (ELECTRODE) ×3 IMPLANT
POUCH RETRIEVAL ECOSAC 10 (ENDOMECHANICALS) IMPLANT
POUCH RETRIEVAL ECOSAC 10MM (ENDOMECHANICALS)
SCISSORS LAP 5X35 DISP (ENDOMECHANICALS) ×3 IMPLANT
SET IRRIG TUBING LAPAROSCOPIC (IRRIGATION / IRRIGATOR) IMPLANT
SLEEVE ENDOPATH XCEL 5M (ENDOMECHANICALS) ×6 IMPLANT
SPECIMEN JAR SMALL (MISCELLANEOUS) IMPLANT
SUT VIC AB 4-0 PS2 27 (SUTURE) ×3 IMPLANT
TOWEL OR 17X24 6PK STRL BLUE (TOWEL DISPOSABLE) ×3 IMPLANT
TOWEL OR 17X26 10 PK STRL BLUE (TOWEL DISPOSABLE) IMPLANT
TRAY LAPAROSCOPIC MC (CUSTOM PROCEDURE TRAY) ×3 IMPLANT
TROCAR XCEL BLUNT TIP 100MML (ENDOMECHANICALS) ×3 IMPLANT
TROCAR XCEL NON-BLD 5MMX100MML (ENDOMECHANICALS) ×3 IMPLANT
TUBING INSUFFLATION (TUBING) ×3 IMPLANT

## 2016-01-15 NOTE — Interval H&P Note (Signed)
History and Physical Interval Note:  01/15/2016 6:58 AM  Levi GunningMichael N Palmer  has presented today for surgery, with the diagnosis of Right upper quadrant pain  The various methods of treatment have been discussed with the patient and family. After consideration of risks, benefits and other options for treatment, the patient has consented to  Procedure(s): EXPLORATORY LAPAROSCOPY (N/A) as a surgical intervention .  The patient's history has been reviewed, patient examined, no change in status, stable for surgery.  I have reviewed the patient's chart and labs.  Questions were answered to the patient's satisfaction.     Alexsandra Shontz E

## 2016-01-15 NOTE — Op Note (Signed)
01/15/2016  8:16 AM  PATIENT:  Levi Palmer  30 y.o. male  PRE-OPERATIVE DIAGNOSIS:  Right upper quadrant pain  POST-OPERATIVE DIAGNOSIS:  Right upper quadrant pain, adhesions  PROCEDURE:  Procedure(s): EXPLORATORY LAPAROSCOPY LAPAROSCOPIC LYSIS OF ADHESIONS 20 MINUTES  SURGEON:  Surgeon(s): Violeta GelinasBurke Anique Beckley, MD  ASSISTANTS: none   ANESTHESIA:   local and general  EBL:  Total I/O In: -  Out: 5 [Blood:5]  BLOOD ADMINISTERED:none  DRAINS: none   SPECIMEN:  No Specimen   DISPOSITION OF SPECIMEN:  N/A  COUNTS:  YES  DICTATION: Findings: Extensive adhesions between the duodenum, omentum, and liver bed. No other intra-abdominal abnormalities seen.  Procedure in detail: Casimiro NeedleMichael presents for laparoscopic exploration due to chronic right upper quadrant pain after laparoscopic cholecyst[place] several years ago [place]. He was identified in the preop holding area. Informed consent was obtained. He received intravenous antibiotics. He is brought to the operating room and general endotracheal anesthesia was administered by the anesthesia staff. His abdomen was prepped and draped in a sterile fashion. We did a time out procedure.The infraumbilical region was infiltrated with local. Infraumbilical incision was made. Subcutaneous tissues were dissected down revealing the anterior fascia. This was divided sharply along the midline. Peritoneal cavity was entered under direct vision without complication. A 0 Vicryl pursestring was placed around the fascial opening. Hassan trocar was inserted into the abdomen. The abdomen was insufflated with carbon dioxide in standard fashion. Under direct vision a 5mm epigastric and a 5 mm right abdominal port were placed. Local was used at each port site. Four-quadrant laparoscopic exploration revealed adhesions in the right upper quadrant but no other abnormalities were seen. The liver bed was closed explored. There were a lot of adhesions between  the liver bed and the duodenum and omentum. These were carefully gradually taken down keeping good hemostasis. Total less than adhesions was 20 minutes. Stomach and duodenal morphology appeared normal at the completion of this. No other abnormalities noted. Hemostasis was ensured. Ports removed under direct vision. Pneumoperitoneum was released. Informed local fascia was closed by tying the pursestring. All 3 wounds were irrigated and the skin of each was closed with running 4-0 Vicryl subcuticular followed by liquid band. All counts were correct. Patient tolerated the procedure well without apparent complications and was taken recovery in stable condition.  PATIENT DISPOSITION:  PACU - hemodynamically stable.   Delay start of Pharmacological VTE agent (>24hrs) due to surgical blood loss or risk of bleeding:  no  Violeta GelinasBurke Reis Goga, MD, MPH, FACS Pager: [phone]  7/11/20178:16 AM

## 2016-01-15 NOTE — Transfer of Care (Signed)
Immediate Anesthesia Transfer of Care Note  Patient: Graylon GunningMichael N Marando  Procedure(s) Performed: Procedure(s): EXPLORATORY LAPAROSCOPY (N/A) LAPAROSCOPIC LYSIS OF ADHESIONS times twenty minutes (N/A)  Patient Location: PACU  Anesthesia Type:General  Level of Consciousness: awake, alert , oriented and patient cooperative  Airway & Oxygen Therapy: Patient Spontanous Breathing and Patient connected to nasal cannula oxygen  Post-op Assessment: Report given to RN and Post -op Vital signs reviewed and stable  Post vital signs: Reviewed and stable  Last Vitals:  Filed Vitals:   01/15/16 0600  BP: 110/65  Pulse: 72  Temp: 36.9 C  Resp: 18    Last Pain:  Filed Vitals:   01/15/16 0648  PainSc: 10-Worst pain ever      Patients Stated Pain Goal: 5 (01/15/16 0647)  Complications: No apparent anesthesia complications

## 2016-01-15 NOTE — H&P (Signed)
Levi Palmer is an 30 y.o. male.   Chief Complaint: RUQ pain HPI: See office notes. C/O chronic RUQ pain since [place] [facility]. Extensive GI work-up by Dr. Ewing SchleinMagod negative.  Past Medical History  Diagnosis Date  . Medical history non-contributory   . Anxiety   . Panic attack as reaction to stress   . Anemia   . Gallstones   . Bipolar disorder (HCC)   . Constipation   . Dysrhythmia     irregular heart rate - due to zoloft  . Depression     at times  . Congenital prolapsed rectum   . History of bronchitis   . Pneumomediastinum (HCC)     2-3 years ago  . GERD (gastroesophageal reflux disease)   . Liver lesion     Past Surgical History  Procedure Laterality Date  . Cholecystectomy    . Tonsillectomy    . Incise and drain abcess    . Mouth surgery    . Esophagogastroduodenoscopy N/A 05/22/2015    Procedure: ESOPHAGOGASTRODUODENOSCOPY (EGD);  Surgeon: Vida RiggerMarc Magod, MD;  [facility];  Service: Endoscopy;  Laterality: N/A;    Family History  Problem Relation Age of Onset  . Irritable bowel syndrome Mother   . Kidney disease Mother   . Ulcerative colitis Maternal Grandfather   . Colon cancer Neg Hx   . Colon polyps Neg Hx   . Gallbladder disease Neg Hx   . Esophageal cancer Neg Hx   . Diabetes Neg Hx    Social History:  reports that he has never smoked. He has never used smokeless tobacco. He reports that he uses illicit drugs (Marijuana). He reports that he does not drink alcohol.  Allergies:  Allergies  Allergen Reactions  . Reglan [Metoclopramide] Other (See Comments)    Makes him feel "jittery. States able to tolerate if given IV Benadryl prior  . Toradol [Ketorolac Tromethamine] Other (See Comments)    "jittery and shaky."  . Tramadol Other (See Comments)    Jittery   . Zoloft [Sertraline Hcl] Palpitations    Medications Prior to Admission  Medication Sig Dispense Refill  . clonazePAM (KLONOPIN) 1 MG tablet Take 1 tablet (1 mg total)  by mouth 2 (two) times daily. 60 tablet 2  . diphenhydrAMINE (BENADRYL) 25 mg capsule Take 50 mg by mouth at bedtime as needed for sleep.    Marland Kitchen. ibuprofen (ADVIL,MOTRIN) 200 MG tablet Take 400-800 mg by mouth every 6 (six) hours as needed for moderate pain.     Marland Kitchen. LINZESS 290 MCG CAPS capsule Take 290 mcg by mouth daily.  2  . pregabalin (LYRICA) 50 MG capsule Take 1 capsule (50 mg total) by mouth 3 (three) times daily. 90 capsule 0  . promethazine (PHENERGAN) 25 MG tablet Take 25 mg by mouth every 8 (eight) hours as needed for nausea or vomiting.   2  . sucralfate (CARAFATE) 1 GM/10ML suspension Take 10 mLs (1 g total) by mouth 4 (four) times daily -  with meals and at bedtime. 420 mL 0  . omeprazole (PRILOSEC) 20 MG capsule Take 1 capsule (20 mg total) by mouth daily. 14 capsule 0    No results found for this or any previous visit (from the past 48 hour(s)). No results found.  Review of Systems  Unable to perform ROS: other    Blood pressure 110/65, pulse 72, temperature 98.4 F (36.9 C), resp. rate 18, height 5\' 7"  (1.702 m), weight 58.06 kg (  128 lb), SpO2 100 %. Physical Exam  Constitutional: He is oriented to person, place, and time. He appears well-developed and well-nourished. He appears distressed.  HENT:  Head: Normocephalic and atraumatic.  Eyes: EOM are normal. Pupils are equal, round, and reactive to light.  Neck: No tracheal deviation present.  Cardiovascular: Normal rate and normal heart sounds.   Respiratory: Effort normal and breath sounds normal. No respiratory distress. He has no wheezes.  GI: Soft. He exhibits no distension. There is tenderness. There is no rebound and no guarding.  Mild RUQ tenderness  Musculoskeletal: Normal range of motion.  Neurological: He is alert and oriented to person, place, and time.  Skin: Skin is warm.     Assessment/Plan Exploratory laparoscopy - procedure, risks, and benefits discussed. He agrees.  Liz Malady, MD 01/15/2016,  6:56 AM

## 2016-01-15 NOTE — Anesthesia Procedure Notes (Signed)
Procedure Name: Intubation Date/Time: 01/15/2016 7:42 AM Performed by: Coralee RudFLORES, Emelie Newsom Pre-anesthesia Checklist: Patient identified, Emergency Drugs available, Suction available and Patient being monitored Patient Re-evaluated:Patient Re-evaluated prior to inductionPreoxygenation: Pre-oxygenation with 100% oxygen Intubation Type: IV induction Ventilation: Mask ventilation without difficulty Laryngoscope Size: Miller and 3 Grade View: Grade II Tube type: Oral Number of attempts: 1 Airway Equipment and Method: Stylet Secured at: 22 cm Tube secured with: Tape Dental Injury: Teeth and Oropharynx as per pre-operative assessment

## 2016-01-15 NOTE — Anesthesia Postprocedure Evaluation (Signed)
Anesthesia Post Note  Patient: Levi GunningMichael N Palmer  Procedure(s) Performed: Procedure(s) (LRB): EXPLORATORY LAPAROSCOPY (N/A) LAPAROSCOPIC LYSIS OF ADHESIONS times twenty minutes (N/A)  Patient location during evaluation: PACU Anesthesia Type: General Level of consciousness: awake and alert Pain management: pain level controlled Vital Signs Assessment: post-procedure vital signs reviewed and stable Respiratory status: spontaneous breathing, nonlabored ventilation, respiratory function stable and patient connected to nasal cannula oxygen Cardiovascular status: blood pressure returned to baseline and stable Postop Assessment: no signs of nausea or vomiting Anesthetic complications: no    Last Vitals:  Filed Vitals:   01/15/16 0841 01/15/16 0856  BP: 145/69 141/76  Pulse: 71 74  Temp:    Resp: 12 9    Last Pain:  Filed Vitals:   01/15/16 0904  PainSc: Asleep                 Reino KentJudd, Genelle Economou J

## 2016-01-15 NOTE — Progress Notes (Signed)
Pt and girlfriend waiting on ride

## 2016-01-15 NOTE — Anesthesia Preprocedure Evaluation (Addendum)
Anesthesia Evaluation  Patient identified by MRN, date of birth, ID band Patient awake    Reviewed: Allergy & Precautions, H&P , NPO status , Patient's Chart, lab work & pertinent test results  History of Anesthesia Complications Negative for: history of anesthetic complications  Airway Mallampati: II  TM Distance: >3 FB Neck ROM: full    Dental  (+) Poor Dentition, Dental Advisory Given Terrible dentition:   Pulmonary neg pulmonary ROS,    Pulmonary exam normal breath sounds clear to auscultation       Cardiovascular negative cardio ROS Normal cardiovascular exam+ dysrhythmias  Rhythm:regular Rate:Normal     Neuro/Psych PSYCHIATRIC DISORDERS Anxiety Depression Bipolar Disorder  Neuromuscular disease negative neurological ROS     GI/Hepatic negative GI ROS, Neg liver ROS, GERD  ,  Endo/Other  negative endocrine ROS  Renal/GU negative Renal ROS     Musculoskeletal  (+) Arthritis ,   Abdominal   Peds  Hematology negative hematology ROS (+)   Anesthesia Other Findings   Reproductive/Obstetrics negative OB ROS                            Anesthesia Physical Anesthesia Plan  ASA: III  Anesthesia Plan: General   Post-op Pain Management:    Induction: Intravenous  Airway Management Planned: Oral ETT  Additional Equipment:   Intra-op Plan:   Post-operative Plan:   Informed Consent: I have reviewed the patients History and Physical, chart, labs and discussed the procedure including the risks, benefits and alternatives for the proposed anesthesia with the patient or authorized representative who has indicated his/her understanding and acceptance.   Dental Advisory Given  Plan Discussed with: Anesthesiologist, CRNA and Surgeon  Anesthesia Plan Comments:         Anesthesia Quick Evaluation

## 2016-01-16 ENCOUNTER — Encounter (HOSPITAL_COMMUNITY): Payer: Self-pay | Admitting: General Surgery

## 2016-01-20 ENCOUNTER — Encounter (HOSPITAL_COMMUNITY): Payer: Self-pay | Admitting: *Deleted

## 2016-01-20 ENCOUNTER — Inpatient Hospital Stay (HOSPITAL_COMMUNITY)
Admission: EM | Admit: 2016-01-20 | Discharge: 2016-01-23 | DRG: 395 | Disposition: A | Discharge status: Home / Self Care | Payer: Self-pay | Attending: General Surgery | Admitting: General Surgery

## 2016-01-20 ENCOUNTER — Emergency Department (HOSPITAL_COMMUNITY): Payer: Self-pay

## 2016-01-20 DIAGNOSIS — Z888 Allergy status to other drugs, medicaments and biological substances status: Secondary | ICD-10-CM

## 2016-01-20 DIAGNOSIS — F319 Bipolar disorder, unspecified: Secondary | ICD-10-CM | POA: Diagnosis present

## 2016-01-20 DIAGNOSIS — K589 Irritable bowel syndrome without diarrhea: Secondary | ICD-10-CM | POA: Diagnosis present

## 2016-01-20 DIAGNOSIS — E876 Hypokalemia: Secondary | ICD-10-CM | POA: Diagnosis present

## 2016-01-20 DIAGNOSIS — Z886 Allergy status to analgesic agent status: Secondary | ICD-10-CM

## 2016-01-20 DIAGNOSIS — Z79899 Other long term (current) drug therapy: Secondary | ICD-10-CM

## 2016-01-20 DIAGNOSIS — Y838 Other surgical procedures as the cause of abnormal reaction of the patient, or of later complication, without mention of misadventure at the time of the procedure: Secondary | ICD-10-CM | POA: Diagnosis present

## 2016-01-20 DIAGNOSIS — Z0189 Encounter for other specified special examinations: Secondary | ICD-10-CM

## 2016-01-20 DIAGNOSIS — K56609 Unspecified intestinal obstruction, unspecified as to partial versus complete obstruction: Secondary | ICD-10-CM | POA: Diagnosis present

## 2016-01-20 DIAGNOSIS — F410 Panic disorder [episodic paroxysmal anxiety] without agoraphobia: Secondary | ICD-10-CM | POA: Diagnosis present

## 2016-01-20 DIAGNOSIS — K9189 Other postprocedural complications and disorders of digestive system: Secondary | ICD-10-CM

## 2016-01-20 DIAGNOSIS — K913 Postprocedural intestinal obstruction: Principal | ICD-10-CM | POA: Diagnosis present

## 2016-01-20 DIAGNOSIS — K567 Ileus, unspecified: Secondary | ICD-10-CM | POA: Diagnosis present

## 2016-01-20 LAB — COMPREHENSIVE METABOLIC PANEL
ALBUMIN: 4.8 g/dL (ref 3.5–5.0)
ALT: 12 U/L — ABNORMAL LOW (ref 17–63)
ANION GAP: 12 (ref 5–15)
AST: 21 U/L (ref 15–41)
Alkaline Phosphatase: 54 U/L (ref 38–126)
BILIRUBIN TOTAL: 0.6 mg/dL (ref 0.3–1.2)
BUN: 13 mg/dL (ref 6–20)
CHLORIDE: 106 mmol/L (ref 101–111)
CO2: 20 mmol/L — AB (ref 22–32)
Calcium: 10.6 mg/dL — ABNORMAL HIGH (ref 8.9–10.3)
Creatinine, Ser: 0.93 mg/dL (ref 0.61–1.24)
GFR calc Af Amer: >60 mL/min (ref >60)
GFR calc non Af Amer: >60 mL/min (ref >60)
GLUCOSE: 143 mg/dL — AB (ref 65–99)
POTASSIUM: 3.6 mmol/L (ref 3.5–5.1)
SODIUM: 138 mmol/L (ref 135–145)
TOTAL PROTEIN: 8.1 g/dL (ref 6.5–8.1)

## 2016-01-20 LAB — CBC
HCT: 38.4 % — ABNORMAL LOW (ref 39.0–52.0)
HEMOGLOBIN: 13 g/dL (ref 13.0–17.0)
MCH: 26.6 pg (ref 26.0–34.0)
MCHC: 33.9 g/dL (ref 30.0–36.0)
MCV: 78.5 fL (ref 78.0–100.0)
PLATELETS: 262 10*3/uL (ref 150–400)
RBC: 4.89 MIL/uL (ref 4.22–5.81)
RDW: 16.9 % — ABNORMAL HIGH (ref 11.5–15.5)
WBC: 12.8 10*3/uL — ABNORMAL HIGH (ref 4.0–10.5)

## 2016-01-20 LAB — URINALYSIS, ROUTINE W REFLEX MICROSCOPIC
BILIRUBIN URINE: NEGATIVE
Glucose, UA: NEGATIVE mg/dL
KETONES UR: 40 mg/dL — AB
Leukocytes, UA: NEGATIVE
NITRITE: NEGATIVE
PROTEIN: NEGATIVE mg/dL
pH: 7 (ref 5.0–8.0)

## 2016-01-20 LAB — URINE MICROSCOPIC-ADD ON: BACTERIA UA: NONE SEEN

## 2016-01-20 LAB — I-STAT CG4 LACTIC ACID, ED: LACTIC ACID, VENOUS: 1.51 mmol/L (ref 0.5–1.9)

## 2016-01-20 LAB — LIPASE, BLOOD: Lipase: 13 U/L (ref 11–51)

## 2016-01-20 MED ORDER — ONDANSETRON HCL 4 MG/2ML IJ SOLN
4.0000 mg | Freq: Once | INTRAMUSCULAR | Status: AC
  Administered 2016-01-20: 4 mg via INTRAVENOUS
  Filled 2016-01-20: qty 2

## 2016-01-20 MED ORDER — HYDROMORPHONE HCL 1 MG/ML IJ SOLN
1.0000 mg | Freq: Once | INTRAMUSCULAR | Status: AC
Start: 2016-01-20 — End: 2016-01-20
  Administered 2016-01-20: 1 mg via INTRAVENOUS
  Filled 2016-01-20: qty 1

## 2016-01-20 MED ORDER — POTASSIUM CHLORIDE IN NACL 20-0.9 MEQ/L-% IV SOLN
INTRAVENOUS | Status: DC
  Administered 2016-01-20 – 2016-01-22 (×4): via INTRAVENOUS
  Filled 2016-01-20 (×4): qty 1000

## 2016-01-20 MED ORDER — HYDROMORPHONE HCL 1 MG/ML IJ SOLN
1.0000 mg | Freq: Once | INTRAMUSCULAR | Status: AC
  Administered 2016-01-20: 1 mg via INTRAVENOUS
  Filled 2016-01-20: qty 1

## 2016-01-20 MED ORDER — MORPHINE SULFATE (PF) 2 MG/ML IV SOLN
2.0000 mg | INTRAVENOUS | Status: DC | PRN
  Administered 2016-01-20 – 2016-01-21 (×3): 4 mg via INTRAVENOUS
  Administered 2016-01-21 (×2): 2 mg via INTRAVENOUS
  Administered 2016-01-21: 4 mg via INTRAVENOUS
  Administered 2016-01-21: 2 mg via INTRAVENOUS
  Administered 2016-01-21 (×2): 4 mg via INTRAVENOUS
  Administered 2016-01-22 (×2): 2 mg via INTRAVENOUS
  Administered 2016-01-22: 4 mg via INTRAVENOUS
  Administered 2016-01-22 – 2016-01-23 (×5): 2 mg via INTRAVENOUS
  Filled 2016-01-20 (×5): qty 1
  Filled 2016-01-20: qty 2
  Filled 2016-01-20 (×3): qty 1
  Filled 2016-01-20: qty 2
  Filled 2016-01-20: qty 1
  Filled 2016-01-20: qty 2
  Filled 2016-01-20: qty 1
  Filled 2016-01-20 (×3): qty 2
  Filled 2016-01-20: qty 1
  Filled 2016-01-20: qty 2

## 2016-01-20 MED ORDER — SODIUM CHLORIDE 0.9 % IV BOLUS (SEPSIS)
1000.0000 mL | Freq: Once | INTRAVENOUS | Status: AC
  Administered 2016-01-20: 1000 mL via INTRAVENOUS

## 2016-01-20 MED ORDER — ENOXAPARIN SODIUM 40 MG/0.4ML ~~LOC~~ SOLN
40.0000 mg | SUBCUTANEOUS | Status: DC
  Administered 2016-01-21 – 2016-01-23 (×3): 40 mg via SUBCUTANEOUS
  Filled 2016-01-20 (×3): qty 0.4

## 2016-01-20 MED ORDER — PROMETHAZINE HCL 25 MG/ML IJ SOLN
25.0000 mg | Freq: Once | INTRAMUSCULAR | Status: AC
  Administered 2016-01-20: 25 mg via INTRAVENOUS
  Filled 2016-01-20: qty 1

## 2016-01-20 MED ORDER — ONDANSETRON HCL 4 MG/2ML IJ SOLN
4.0000 mg | Freq: Four times a day (QID) | INTRAMUSCULAR | Status: DC | PRN
  Administered 2016-01-20 – 2016-01-23 (×9): 4 mg via INTRAVENOUS
  Filled 2016-01-20 (×8): qty 2

## 2016-01-20 MED ORDER — METHOCARBAMOL 500 MG PO TABS
500.0000 mg | ORAL_TABLET | Freq: Four times a day (QID) | ORAL | Status: DC | PRN
  Administered 2016-01-22 – 2016-01-23 (×3): 500 mg via ORAL
  Filled 2016-01-20 (×3): qty 1

## 2016-01-20 MED ORDER — DIPHENHYDRAMINE HCL 25 MG PO CAPS: 25.0000 mg | ORAL_CAPSULE | Freq: Four times a day (QID) | ORAL | Status: DC | PRN

## 2016-01-20 MED ORDER — FAMOTIDINE IN NACL 20-0.9 MG/50ML-% IV SOLN
20.0000 mg | Freq: Two times a day (BID) | INTRAVENOUS | Status: DC
  Administered 2016-01-20 – 2016-01-22 (×5): 20 mg via INTRAVENOUS
  Filled 2016-01-20 (×7): qty 50

## 2016-01-20 MED ORDER — ONDANSETRON 4 MG PO TBDP: 4.0000 mg | ORAL_TABLET | Freq: Four times a day (QID) | ORAL | Status: DC | PRN

## 2016-01-20 MED ORDER — IOPAMIDOL (ISOVUE-300) INJECTION 61%
INTRAVENOUS | Status: AC
  Administered 2016-01-20: 100 mL
  Filled 2016-01-20: qty 100

## 2016-01-20 MED ORDER — DIPHENHYDRAMINE HCL 50 MG/ML IJ SOLN
25.0000 mg | Freq: Four times a day (QID) | INTRAMUSCULAR | Status: DC | PRN
  Administered 2016-01-21 – 2016-01-22 (×3): 25 mg via INTRAVENOUS
  Filled 2016-01-20 (×3): qty 1

## 2016-01-20 MED ORDER — DEXTROSE-NACL 5-0.45 % IV SOLN: INTRAVENOUS | Status: DC

## 2016-01-20 MED ORDER — DIATRIZOATE MEGLUMINE & SODIUM 66-10 % PO SOLN
90.0000 mL | Freq: Once | ORAL | Status: AC
  Administered 2016-01-20: 90 mL via NASOGASTRIC
  Filled 2016-01-20: qty 90

## 2016-01-20 MED ORDER — DIATRIZOATE MEGLUMINE & SODIUM 66-10 % PO SOLN
ORAL | Status: AC
  Administered 2016-01-20: 12:00:00
  Filled 2016-01-20: qty 30

## 2016-01-20 NOTE — ED Notes (Signed)
Patient transported to CT 

## 2016-01-20 NOTE — ED Provider Notes (Signed)
CSN: [id number]     Arrival date & time 01/20/16  0854 History   First MD Initiated Contact with Patient 01/20/16 [phone]     Chief Complaint  Patient presents with  . Abdominal Pain     (Consider location/radiation/quality/duration/timing/severity/associated sxs/prior Treatment) HPI Comments: 30 year old while male presents with abdominal pain for the past 3 days. Patient had a laparoscopic surgery to remove large area of scar tissue on Thursday (POD #4). Since then his abdominal pain has worsened. Patient complains of epigastric and periumbilical pain that is constant and stabbing. It radiates to his back, occasionally to his chest and is exacerbated by deep breathing. Patient was given PO pain medications after his procedure on Thursday but they have not helped his pain. Patient complains of SOB and CP that is related to his severe abdominal pain as well as n/v, chills and sweats. Patient had bilious emesis 6 times last night. His last bowel movement was last night and denies changes to his stool.  Patient denies fever, urinary changes, blood in stool or emesis.   ROS 10 Systems reviewed and are negative for acute change except as noted in the HPI.     Patient is a 30 y.o. male presenting with abdominal pain. The history is provided by the patient.  Abdominal Pain   Past Medical History  Diagnosis Date  . Medical history non-contributory   . Anxiety   . Panic attack as reaction to stress   . Anemia   . Gallstones   . Bipolar disorder (HCC)   . Constipation   . Dysrhythmia     irregular heart rate - due to zoloft  . Depression     at times  . Congenital prolapsed rectum   . History of bronchitis   . Pneumomediastinum (HCC)     2-3 years ago  . GERD (gastroesophageal reflux disease)   . Liver lesion    Past Surgical History  Procedure Laterality Date  . Cholecystectomy    . Tonsillectomy    . Incise and drain abcess    . Mouth surgery    . Esophagogastroduodenoscopy N/A  05/22/2015    Procedure: ESOPHAGOGASTRODUODENOSCOPY (EGD);  Surgeon: Vida RiggerMarc Magod, MD;  [facility];  Service: Endoscopy;  Laterality: N/A;  . Cholecystectomy N/A 01/15/2016    Procedure: EXPLORATORY LAPAROSCOPY;  Surgeon: Violeta GelinasBurke Thompson, MD;  [facility];  Service: General;  Laterality: N/A;  . Laparoscopic lysis of adhesions N/A 01/15/2016    Procedure: LAPAROSCOPIC LYSIS OF ADHESIONS times twenty minutes;  Surgeon: Violeta GelinasBurke Thompson, MD;  [facility];  Service: General;  Laterality: N/A;   Family History  Problem Relation Age of Onset  . Irritable bowel syndrome Mother   . Kidney disease Mother   . Ulcerative colitis Maternal Grandfather   . Colon cancer Neg Hx   . Colon polyps Neg Hx   . Gallbladder disease Neg Hx   . Esophageal cancer Neg Hx   . Diabetes Neg Hx    Social History  Substance Use Topics  . Smoking status: Never Smoker   . Smokeless tobacco: Never Used  . Alcohol Use: No    Review of Systems  Gastrointestinal: Positive for abdominal pain.      Allergies  Reglan; Toradol; Tramadol; and Zoloft  Home Medications   Prior to Admission medications   Medication Sig Start Date End Date Taking? Authorizing Provider  clonazePAM (KLONOPIN) 1 MG tablet Take 1 tablet (1 mg total) by mouth 2 (two) times daily.  11/01/15  Yes Josalyn Funches, MD  diphenhydrAMINE (BENADRYL) 25 mg capsule Take 50 mg by mouth at bedtime as needed for sleep.   Yes Historical Provider, MD  ibuprofen (ADVIL,MOTRIN) 200 MG tablet Take 400-800 mg by mouth every 6 (six) hours as needed for moderate pain.    Yes Historical Provider, MD  LINZESS 290 MCG CAPS capsule Take 290 mcg by mouth daily. 07/16/15  Yes Historical Provider, MD  omeprazole (PRILOSEC) 20 MG capsule Take 1 capsule (20 mg total) by mouth daily. 12/28/15  Yes Tilden Fossa, MD  oxyCODONE (OXY IR/ROXICODONE) 5 MG immediate release tablet Take 1-2 tablets (5-10 mg total) by mouth every 6 (six) hours as needed for moderate  pain or severe pain. 01/15/16  Yes Violeta Gelinas, MD  pregabalin (LYRICA) 50 MG capsule Take 1 capsule (50 mg total) by mouth 3 (three) times daily. 09/20/15  Yes Dessa Phi, MD  promethazine (PHENERGAN) 25 MG tablet Take 25 mg by mouth every 8 (eight) hours as needed for nausea or vomiting.  10/25/15  Yes Historical Provider, MD  sucralfate (CARAFATE) 1 GM/10ML suspension Take 10 mLs (1 g total) by mouth 4 (four) times daily -  with meals and at bedtime. 12/28/15  Yes Tilden Fossa, MD   BP 121/79 mmHg  Pulse 92  Temp(Src) 98.6 F (37 C) (Oral)  Resp 14  Ht  (1.676 m)  Wt 126 lb 9.6 oz (57.425 kg)  BMI 20.44 kg/m2  SpO2 100% Physical Exam  Constitutional: He is oriented to person, place, and time. He appears well-developed.  HENT:  Head: Normocephalic and atraumatic.  Eyes: Conjunctivae and EOM are normal. Pupils are equal, round, and reactive to light.  Neck: Normal range of motion. Neck supple.  Cardiovascular: Normal rate, regular rhythm and normal heart sounds.   Pulmonary/Chest: Effort normal and breath sounds normal. No respiratory distress. He has no wheezes.  Abdominal: Soft. Bowel sounds are normal. He exhibits no distension. There is no tenderness. There is no rebound and no guarding.  Neurological: He is alert and oriented to person, place, and time.  Skin: Skin is warm.  Nursing note and vitals reviewed.   ED Course  Procedures (including critical care time) Labs Review Labs Reviewed  COMPREHENSIVE METABOLIC PANEL - Abnormal; Notable for the following:    CO2 20 (*)    Glucose, Bld 143 (*)    Calcium 10.6 (*)    ALT 12 (*)    All other components within normal limits  CBC - Abnormal; Notable for the following:    WBC 12.8 (*)    HCT 38.4 (*)    RDW 16.9 (*)    All other components within normal limits  URINALYSIS, ROUTINE W REFLEX MICROSCOPIC (NOT AT [facility]) - Abnormal; Notable for the following:    Specific Gravity, Urine >1.046 (*)    Hgb urine  dipstick TRACE (*)    Ketones, ur 40 (*)    All other components within normal limits  URINE MICROSCOPIC-ADD ON - Abnormal; Notable for the following:    Squamous Epithelial / LPF 0-5 (*)    All other components within normal limits  LIPASE, BLOOD  BASIC METABOLIC PANEL  CBC  I-STAT CG4 LACTIC ACID, ED    Imaging Review Ct Abdomen Pelvis W Contrast  01/20/2016  CLINICAL DATA:  Adhesion removed from the liver on 01/15/2016. History of gallbladder surgery in 2014. Severe right-sided abdominal pain. Nausea, vomiting. EXAM: CT ABDOMEN AND PELVIS WITH CONTRAST TECHNIQUE: Multidetector CT imaging of  the abdomen and pelvis was performed using the standard protocol following bolus administration of intravenous contrast. CONTRAST/ TECHNIQUE: ISOVUE-300 IOPAMIDOL (ISOVUE-300) INJECTION 61% ; scan was repeated secondary to motion COMPARISON:  12/03/2015 FINDINGS: Lower chest: No pulmonary nodules, pleural effusions, or infiltrates. Heart size is normal. No imaged pericardial effusion or significant coronary artery calcifications. Hepatobiliary: Small low-attenuation lesion is identified within the posterior segment of the right hepatic lobe, consistent with small cyst and unchanged. No new focal lesions are identified within the liver. There are small amounts of perihepatic air following recent surgery. Status post cholecystectomy. Pancreas: Pancreas is normal in appearance. Spleen: Normal in appearance. Renal/Adrenal: Adrenal glands are unremarkable. There is symmetric enhancement of both kidneys. Gastrointestinal tract: Stomach is normal in appearance. Small bowel loops are not dilated. Multiple air-fluid levels are identified. Contrast does not reach the terminal ileum prior to exam. The transition point to normal caliber bowel is is not well identified. There is gradual transition to normal bowel loops within the left lower quadrant. Colonic loops are nondilated. There is stranding in the right mid  abdominal colonic mesentery. Small amounts of gas are identified throughout the mesentery. Reproductive/Pelvis: Prostate is mildly enlarged. Urinary bladder has a normal appearance. No free pelvic fluid. Vascular/Lymphatic: No evidence for aortic aneurysm. No retroperitoneal or mesenteric adenopathy. Musculoskeletal/Abdominal wall: Unremarkable abdominal wall. Visualized osseous structures have a normal appearance. Other: none IMPRESSION: 1. Small bowel obstruction, best localized to the left lower quadrant where there is gradual transition to normal caliber bowel. 2. Multiple locules of free air, throughout the upper abdomen and throughout the mesentery. 3. Stranding within the mid anterior vein right mid abdomen, possibly postoperative. Inflammation/infection is not excluded. 4. No abscess. 5. Mild prostatic enlargement. Electronically Signed   By: Norva Pavlov M.D.   On: 01/20/2016 15:07   Dg Abd Portable 1v  01/20/2016  CLINICAL DATA:  30 year old male -NG tube placement. Patient with exploratory laparoscopy and lysis of adhesions on 01/15/2016. EXAM: PORTABLE ABDOMEN - 1 VIEW COMPARISON:  01/20/2016 and prior studies FINDINGS: An NG tube is identified with tip overlying the proximal-mid stomach. No other significant changes identified. IMPRESSION: NG tube with tip overlying the proximal-mid stomach. Electronically Signed   By: Harmon Pier M.D.   On: 01/20/2016 16:31   I have personally reviewed and evaluated these images and lab results as part of my medical decision-making.   EKG Interpretation None      MDM   Final diagnoses:  SBO (small bowel obstruction) (HCC)    Pt comes in with worsening abd pain post surgery for adhesions removal. He has hx of cholecystectomy and IBS.  DDX; Ileus Seroma Hematoma / Bleed Infection SBO  CT abdomen and pelvis ordered and we will reassess.  Chest pain is non specific and we have no clinical concerns for ACS.  Derwood Kaplan, MD 01/20/16  1827

## 2016-01-20 NOTE — ED Notes (Signed)
X-ray at bedside

## 2016-01-20 NOTE — ED Notes (Signed)
Attempted report to 6N 

## 2016-01-20 NOTE — H&P (Signed)
Levi Palmer is an 30 y.o. male.   Chief Complaint: Nausea, vomiting HPI: This is a 30 yo male presents four days after diagnostic laparoscopy/ lysis of adhesions in RUQ by Dr. Georganna Skeans.  He had a laparoscopic cholecystectomy in 2008 in [place] and developed chronic RUQ pain.  He has undergone extensive GI work-up including EGD, gastric emptying study, CT enterography by Dr. Watt Climes.  The patient reports chronic hiccups since childhood.  The surgery was performed on 01/15/16 as an outpatient.  His RUQ pain has resolved, but he has some mild periumbilical tenderness.  For the last two days, he has had multiple episodes of vomiting, usually fairly precipitously with little nausea.  The vomitus is bilious and does not appear to be food.  He has been eating and continues to have bowel movements daily.  He was previously on Linzess for IBS, but he has no refills.    I do not have access to Dr. Perley Jain notes, but the brief clinical data on some of his previous imaging studies describes intermittent nausea and vomiting over the last 10 years.  The findings at the time of diagnostic laparoscopy do not provide any information regarding the etiology of the nausea and vomiting, but the chronic RUQ pain seems to be resolved.  Past Medical History  Diagnosis Date  . Medical history non-contributory   . Anxiety   . Panic attack as reaction to stress   . Anemia   . Gallstones   . Bipolar disorder ([place])   . Constipation   . Dysrhythmia     irregular heart rate - due to zoloft  . Depression     at times  . Congenital prolapsed rectum   . History of bronchitis   . Pneumomediastinum ([place])     2-3 years ago  . GERD (gastroesophageal reflux disease)   . Liver lesion     Past Surgical History  Procedure Laterality Date  . Cholecystectomy    . Tonsillectomy    . Incise and drain abcess    . Mouth surgery    . Esophagogastroduodenoscopy N/A 05/22/2015    Procedure: ESOPHAGOGASTRODUODENOSCOPY  (EGD);  Surgeon: Clarene Essex, MD;  Location[facility] ENDOSCOPY;  Service: Endoscopy;  Laterality: N/A;  . Cholecystectomy N/A 01/15/2016    Procedure: EXPLORATORY LAPAROSCOPY;  Surgeon: Georganna Skeans, MD;  Location: [place];  Service: General;  Laterality: N/A;  . Laparoscopic lysis of adhesions N/A 01/15/2016    Procedure: LAPAROSCOPIC LYSIS OF ADHESIONS times twenty minutes;  Surgeon: Georganna Skeans, MD;  Location: [facility] OR;  Service: General;  Laterality: N/A;    Family History  Problem Relation Age of Onset  . Irritable bowel syndrome Mother   . Kidney disease Mother   . Ulcerative colitis Maternal Grandfather   . Colon cancer Neg Hx   . Colon polyps Neg Hx   . Gallbladder disease Neg Hx   . Esophageal cancer Neg Hx   . Diabetes Neg Hx    Social History:  reports that he has never smoked. He has never used smokeless tobacco. He reports that he uses illicit drugs (Marijuana). He reports that he does not drink alcohol.  Allergies:  Allergies  Allergen Reactions  . Reglan [Metoclopramide] Other (See Comments)    Makes him feel "jittery. States able to tolerate if given IV Benadryl prior  . Toradol [Ketorolac Tromethamine] Other (See Comments)    "jittery and shaky."  . Tramadol Other (See Comments)    Jittery   . Zoloft [  Sertraline Hcl] Palpitations   Prior to Admission medications   Medication Sig Start Date End Date Taking? Authorizing Provider  clonazePAM (KLONOPIN) 1 MG tablet Take 1 tablet (1 mg total) by mouth 2 (two) times daily. 11/01/15  Yes Josalyn Funches, MD  diphenhydrAMINE (BENADRYL) 25 mg capsule Take 50 mg by mouth at bedtime as needed for sleep.   Yes Historical Provider, MD  ibuprofen (ADVIL,MOTRIN) 200 MG tablet Take 400-800 mg by mouth every 6 (six) hours as needed for moderate pain.    Yes Historical Provider, MD  LINZESS 290 MCG CAPS capsule Take 290 mcg by mouth daily. 07/16/15  Yes Historical Provider, MD  omeprazole (PRILOSEC) 20 MG capsule Take 1 capsule (20 mg total)  by mouth daily. 12/28/15  Yes Quintella Reichert, MD  oxyCODONE (OXY IR/ROXICODONE) 5 MG immediate release tablet Take 1-2 tablets (5-10 mg total) by mouth every 6 (six) hours as needed for moderate pain or severe pain. 01/15/16  Yes Georganna Skeans, MD  pregabalin (LYRICA) 50 MG capsule Take 1 capsule (50 mg total) by mouth 3 (three) times daily. 09/20/15  Yes Boykin Nearing, MD  promethazine (PHENERGAN) 25 MG tablet Take 25 mg by mouth every 8 (eight) hours as needed for nausea or vomiting.  10/25/15  Yes Historical Provider, MD  sucralfate (CARAFATE) 1 GM/10ML suspension Take 10 mLs (1 g total) by mouth 4 (four) times daily -  with meals and at bedtime. 12/28/15  Yes Quintella Reichert, MD     Results for orders placed or performed during the hospital encounter of 01/20/16 (from the past 48 hour(s))  Lipase, blood     Status: None   Collection Time: 01/20/16  9:11 AM  Result Value Ref Range   Lipase 13 11 - 51 U/L  Comprehensive metabolic panel     Status: Abnormal   Collection Time: 01/20/16  9:11 AM  Result Value Ref Range   Sodium 138 135 - 145 mmol/L   Potassium 3.6 3.5 - 5.1 mmol/L   Chloride 106 101 - 111 mmol/L   CO2 20 (L) 22 - 32 mmol/L   Glucose, Bld 143 (H) 65 - 99 mg/dL   BUN 13 6 - 20 mg/dL   Creatinine, Ser 0.93 0.61 - 1.24 mg/dL   Calcium 10.6 (H) 8.9 - 10.3 mg/dL   Total Protein 8.1 6.5 - 8.1 g/dL   Albumin 4.8 3.5 - 5.0 g/dL   AST 21 15 - 41 U/L   ALT 12 (L) 17 - 63 U/L   Alkaline Phosphatase 54 38 - 126 U/L   Total Bilirubin 0.6 0.3 - 1.2 mg/dL   GFR calc non Af Amer >60 >60 mL/min   GFR calc Af Amer >60 >60 mL/min    Comment: (NOTE) The eGFR has been calculated using the CKD EPI equation. This calculation has not been validated in all clinical situations. eGFR's persistently <60 mL/min signify possible Chronic Kidney Disease.    Anion gap 12 5 - 15  CBC     Status: Abnormal   Collection Time: 01/20/16  9:11 AM  Result Value Ref Range   WBC 12.8 (H) 4.0 - 10.5 K/uL    RBC 4.89 4.22 - 5.81 MIL/uL   Hemoglobin 13.0 13.0 - 17.0 g/dL   HCT 38.4 (L) 39.0 - 52.0 %   MCV 78.5 78.0 - 100.0 fL   MCH 26.6 26.0 - 34.0 pg   MCHC 33.9 30.0 - 36.0 g/dL   RDW 16.9 (H) 11.5 - 15.5 %  Platelets 262 150 - 400 K/uL  I-Stat CG4 Lactic Acid, ED     Status: None   Collection Time: 01/20/16 10:56 AM  Result Value Ref Range   Lactic Acid, Venous 1.51 0.5 - 1.9 mmol/L   Ct Abdomen Pelvis W Contrast  01/20/2016  CLINICAL DATA:  Adhesion removed from the liver on 01/15/2016. History of gallbladder surgery in 2014. Severe right-sided abdominal pain. Nausea, vomiting. EXAM: CT ABDOMEN AND PELVIS WITH CONTRAST TECHNIQUE: Multidetector CT imaging of the abdomen and pelvis was performed using the standard protocol following bolus administration of intravenous contrast. CONTRAST/ TECHNIQUE: 120m ISOVUE-300 IOPAMIDOL (ISOVUE-300) INJECTION 61% ; scan was repeated secondary to motion COMPARISON:  12/03/2015 FINDINGS: Lower chest: No pulmonary nodules, pleural effusions, or infiltrates. Heart size is normal. No imaged pericardial effusion or significant coronary artery calcifications. Hepatobiliary: Small low-attenuation lesion is identified within the posterior segment of the right hepatic lobe, consistent with small cyst and unchanged. No new focal lesions are identified within the liver. There are small amounts of perihepatic air following recent surgery. Status post cholecystectomy. Pancreas: Pancreas is normal in appearance. Spleen: Normal in appearance. Renal/Adrenal: Adrenal glands are unremarkable. There is symmetric enhancement of both kidneys. Gastrointestinal tract: Stomach is normal in appearance. Small bowel loops are not dilated. Multiple air-fluid levels are identified. Contrast does not reach the terminal ileum prior to exam. The transition point to normal caliber bowel is is not well identified. There is gradual transition to normal bowel loops within the left lower quadrant.  Colonic loops are nondilated. There is stranding in the right mid abdominal colonic mesentery. Small amounts of gas are identified throughout the mesentery. Reproductive/Pelvis: Prostate is mildly enlarged. Urinary bladder has a normal appearance. No free pelvic fluid. Vascular/Lymphatic: No evidence for aortic aneurysm. No retroperitoneal or mesenteric adenopathy. Musculoskeletal/Abdominal wall: Unremarkable abdominal wall. Visualized osseous structures have a normal appearance. Other: none IMPRESSION: 1. Small bowel obstruction, best localized to the left lower quadrant where there is gradual transition to normal caliber bowel. 2. Multiple locules of free air, throughout the upper abdomen and throughout the mesentery. 3. Stranding within the mid anterior vein right mid abdomen, possibly postoperative. Inflammation/infection is not excluded. 4. No abscess. 5. Mild prostatic enlargement. Electronically Signed   By: ENolon NationsM.D.   On: 01/20/2016 15:07   Dg Abd Portable 1v  01/20/2016  CLINICAL DATA:  30year old male -NG tube placement. Patient with exploratory laparoscopy and lysis of adhesions on 01/15/2016. EXAM: PORTABLE ABDOMEN - 1 VIEW COMPARISON:  01/20/2016 and prior studies FINDINGS: An NG tube is identified with tip overlying the proximal-mid stomach. No other significant changes identified. IMPRESSION: NG tube with tip overlying the proximal-mid stomach. Electronically Signed   By: JMargarette CanadaM.D.   On: 01/20/2016 16:31    Review of Systems  Constitutional: Negative for weight loss.  HENT: Negative for ear discharge, ear pain, hearing loss and tinnitus.   Eyes: Negative for blurred vision, double vision, photophobia and pain.  Respiratory: Negative for cough, sputum production and shortness of breath.   Cardiovascular: Negative for chest pain.  Gastrointestinal: Positive for nausea, vomiting and abdominal pain.  Genitourinary: Negative for dysuria, urgency, frequency and flank pain.   Musculoskeletal: Negative for myalgias, back pain, joint pain, falls and neck pain.  Neurological: Negative for dizziness, tingling, sensory change, focal weakness, loss of consciousness and headaches.  Endo/Heme/Allergies: Does not bruise/bleed easily.  Psychiatric/Behavioral: Negative for depression, memory loss and substance abuse. The patient is not nervous/anxious.  Blood pressure 129/90, pulse 98, temperature 98.3 F (36.8 C), temperature source Oral, resp. rate 21, SpO2 97 %. Physical Exam  WDWN in NAD HEENT:  EOMI, sclera anicteric Neck:  No masses, no thyromegaly Lungs:  CTA bilaterally; normal respiratory effort CV:  Regular rate and rhythm; no murmurs Abd:  +bowel sounds, soft, non-distended Incisions healing well; slight bruising around umbilicus Minimal periumbilical tenderness Ext:  Well-perfused; no edema Skin:  Warm, dry; no sign of jaundice   Assessment/Plan 1.  Clinically, the patient does not appear to be obstructed based on the fact that he is not distended and continues to have bowel movements. 2.  His symptoms appear to be similar to his chronic symptoms of nausea and vomiting.  I question whether he has some type of GI motility disorder.  Unfortunately, he cannot tolerate Reglan.  I do not have his GI records. 3.  No acute surgical indications  Will admit to Dr. Grandville Silos - consider GI consult to Dr. Watt Climes if no improvement. Will proceed with SBO protocol to rule out chronic obstruction   Gerhard Rappaport K., MD 01/20/2016, 4:50 PM

## 2016-01-20 NOTE — ED Notes (Signed)
Pt arrived by [facility] for abd pain. Had abd surgery on Thursday for adhesions, having onset of mid abd pain since and n/v several times today. Given zofran and fentanyl 150mcg pta, diaphoresis noted.

## 2016-01-20 NOTE — Progress Notes (Signed)
Gastrografin given via NG at 1900, Radiology notified, NG clamped until 8pm, Night RN notified.

## 2016-01-21 ENCOUNTER — Inpatient Hospital Stay (HOSPITAL_COMMUNITY): Payer: Self-pay

## 2016-01-21 LAB — CBC
HEMATOCRIT: 38.5 % — AB (ref 39.0–52.0)
HEMOGLOBIN: 12.1 g/dL — AB (ref 13.0–17.0)
MCH: 25.7 pg — AB (ref 26.0–34.0)
MCHC: 31.4 g/dL (ref 30.0–36.0)
MCV: 81.9 fL (ref 78.0–100.0)
Platelets: 233 10*3/uL (ref 150–400)
RBC: 4.7 MIL/uL (ref 4.22–5.81)
RDW: 18 % — ABNORMAL HIGH (ref 11.5–15.5)
WBC: 8.5 10*3/uL (ref 4.0–10.5)

## 2016-01-21 LAB — BASIC METABOLIC PANEL
ANION GAP: 7 (ref 5–15)
BUN: 12 mg/dL (ref 6–20)
CALCIUM: 9.1 mg/dL (ref 8.9–10.3)
CO2: 22 mmol/L (ref 22–32)
Chloride: 110 mmol/L (ref 101–111)
Creatinine, Ser: 0.97 mg/dL (ref 0.61–1.24)
GFR calc non Af Amer: >60 mL/min (ref >60)
GLUCOSE: 84 mg/dL (ref 65–99)
POTASSIUM: 3.3 mmol/L — AB (ref 3.5–5.1)
Sodium: 139 mmol/L (ref 135–145)

## 2016-01-21 MED ORDER — POTASSIUM CHLORIDE 10 MEQ/100ML IV SOLN
10.0000 meq | INTRAVENOUS | Status: AC
  Administered 2016-01-21 (×2): 10 meq via INTRAVENOUS
  Filled 2016-01-21: qty 100

## 2016-01-21 MED ORDER — PHENOL 1.4 % MT LIQD
1.0000 | OROMUCOSAL | Status: DC | PRN
  Administered 2016-01-21: 1 via OROMUCOSAL
  Filled 2016-01-21: qty 177

## 2016-01-21 MED ORDER — POTASSIUM CHLORIDE 10 MEQ/100ML IV SOLN
INTRAVENOUS | Status: AC
  Filled 2016-01-21: qty 100

## 2016-01-21 NOTE — Progress Notes (Signed)
Patient ID: Levi GunningMichael N Palmer, male   DOB: 08-Aug-1985, 30 y.o.   MRN: [id number] Liquid BM. No nausea with NGT clamped. I D/C d it. Clears. Hope to advance in the AM. Violeta GelinasBurke Iyanni Hepp, MD, MPH, FACS Trauma: [phone] General Surgery: [phone]

## 2016-01-21 NOTE — Progress Notes (Signed)
  Subjective: Flatus and BM  Objective: Vital signs in last 24 hours: Temp:  [97.8 F (36.6 C)-98.6 F (37 C)] 97.8 F (36.6 C) (07/17 0403) Pulse Rate:  [54-113] 56 (07/17 0403) Resp:  [12-25] 14 (07/17 0403) BP: (95-160)/(59-114) 124/67 mmHg (07/17 0403) SpO2:  [94 %-100 %] 100 % (07/17 0403) Weight:  [57.425 kg (126 lb 9.6 oz)] 57.425 kg (126 lb 9.6 oz) (07/16 1753) Last BM Date: 01/20/16  Intake/Output from previous day: 07/16 0701 - 07/17 0700 In: 1390.7 [P.O.:90; I.V.:1210.7; NG/GT:90] Out: 550 [Urine:200; Emesis/NG output:350] Intake/Output this shift:    General appearance: cooperative Resp: clear to auscultation bilaterally Cardio: S1, S2 normal GI: soft, incisions OK, mild tenderness L upper, +BS  Lab Results:   Recent Labs  01/20/16 0911 01/21/16 0432  WBC 12.8* 8.5  HGB 13.0 12.1*  HCT 38.4* 38.5*  PLT 262 233   BMET  Recent Labs  01/20/16 0911 01/21/16 0432  NA 138 139  K 3.6 3.3*  CL 106 110  CO2 20* 22  GLUCOSE 143* 84  BUN 13 12  CREATININE 0.93 0.97  CALCIUM 10.6* 9.1   Anti-infectives: Anti-infectives    None      Assessment/Plan: S/P Lap LOA 7/11 PSBO - resolving. Contrast in colon. Clamp NGT for 4h then remove and start clears if tolerates. May benefit from erythromycin. FEN - hypokalemia - replace VTE - Lovenox   LOS: 1 day    Javaughn Opdahl E 01/21/2016

## 2016-01-22 LAB — BASIC METABOLIC PANEL
Anion gap: 3 — ABNORMAL LOW (ref 5–15)
BUN: 6 mg/dL (ref 6–20)
CO2: 26 mmol/L (ref 22–32)
CREATININE: 0.92 mg/dL (ref 0.61–1.24)
Calcium: 8.6 mg/dL — ABNORMAL LOW (ref 8.9–10.3)
Chloride: 109 mmol/L (ref 101–111)
Glucose, Bld: 84 mg/dL (ref 65–99)
POTASSIUM: 3.7 mmol/L (ref 3.5–5.1)
SODIUM: 138 mmol/L (ref 135–145)

## 2016-01-22 MED ORDER — DULOXETINE HCL 30 MG PO CPEP
30.0000 mg | ORAL_CAPSULE | Freq: Every day | ORAL | Status: DC
  Administered 2016-01-22 – 2016-01-23 (×2): 30 mg via ORAL
  Filled 2016-01-22 (×2): qty 1

## 2016-01-22 MED ORDER — CLONAZEPAM 1 MG PO TABS
1.0000 mg | ORAL_TABLET | Freq: Two times a day (BID) | ORAL | Status: DC
  Administered 2016-01-22 – 2016-01-23 (×2): 1 mg via ORAL
  Filled 2016-01-22 (×2): qty 1

## 2016-01-22 NOTE — Progress Notes (Signed)
  Subjective: Liquid BM, minimal nausea, some crampy abdominal pain intermittently  Objective: Vital signs in last 24 hours: Temp:  [98 F (36.7 C)-98.2 F (36.8 C)] 98.2 F (36.8 C) (07/17 2231) Pulse Rate:  [50-64] 50 (07/17 2231) Resp:  [15-16] 16 (07/17 2231) BP: (121-124)/(78) 121/78 mmHg (07/17 2231) SpO2:  [100 %] 100 % (07/17 2231) Last BM Date: 01/21/16  Intake/Output from previous day: 07/17 0701 - 07/18 0700 In: 1450 [P.O.:300; I.V.:1100; IV Piggyback:50] Out: -  Intake/Output this shift: Total I/O In: 1150 [I.V.:1100; IV Piggyback:50] Out: -   General appearance: alert and cooperative Resp: clear to auscultation bilaterally Cardio: regular rate and rhythm GI: soft, incisions OK, min tenderness on L  Lab Results:   Recent Labs  01/20/16 0911 01/21/16 0432  WBC 12.8* 8.5  HGB 13.0 12.1*  HCT 38.4* 38.5*  PLT 262 233   BMET  Recent Labs  01/21/16 0432 01/22/16 0401  NA 139 138  K 3.3* 3.7  CL 110 109  CO2 22 26  GLUCOSE 84 84  BUN 12 6  CREATININE 0.97 0.92  CALCIUM 9.1 8.6*   Anti-infectives: Anti-infectives    None      Assessment/Plan: S/P Lap LOA 7/11 PSBO - resolving. Fulls FEN - hypokalemia improved, decrease IVF VTE - Lovenox  LOS: 2 days    Justin Buechner E 01/22/2016

## 2016-01-22 NOTE — Progress Notes (Signed)
Patient ID: Levi Palmer, male   DOB: 11-May-1986, 30 y.o.   MRN: [id number] Better. Adv diet, saline lock. Start home Cymbalta and Klonopin. [name], MD, MPH, FACS Trauma: [phone] General Surgery: [phone]

## 2016-01-23 MED ORDER — IBUPROFEN 600 MG PO TABS
600.0000 mg | ORAL_TABLET | Freq: Once | ORAL | Status: AC
  Administered 2016-01-23: 600 mg via ORAL
  Filled 2016-01-23: qty 1

## 2016-01-23 NOTE — Progress Notes (Signed)
Levi GunningMichael N Twedt to be D/C'd  per MD order. Discussed with the patient and all questions fully answered.  VSS, Skin clean, dry and intact without evidence of skin break down, no evidence of skin tears noted.  IV catheter discontinued intact. Site without signs and symptoms of complications. Dressing and pressure applied.  An After Visit Summary was printed and given to the patient. Patient received prescription.  D/c education completed with patient/family including follow up instructions, medication list, d/c activities limitations if indicated, with other d/c instructions as indicated by MD - patient able to verbalize understanding, all questions fully answered.   Patient instructed to return to ED, call 911, or call MD for any changes in condition.

## 2016-01-23 NOTE — Discharge Summary (Signed)
Physician Discharge Summary  Patient ID: Levi Palmer N Geathers MRN: [id number] DOB/AGE: [birth year] 30 y.o.  Admit date: 01/20/2016 Discharge date: 01/23/2016  Admission Diagnoses:Small bowel obstruction  Discharge Diagnoses: Small bowel obstruction resolved Active Problems:   SBO (small bowel obstruction) (HCC)   Ileus, postoperative   Discharged Condition: good  Hospital Course: Casimiro NeedleMichael underwent laparoscopic lysis of adhesions for chronic right upper quadrant pain. His right upper quadrant pain improved but he developed symptoms of partial small bowel obstruction and was readmited to the hospital. Small bowel protocol was followed with quick resolution and bowel movements by the following day. His diet was gradually advanced and he is discharged home in stable condition. He feels his postoperative oxycodone exacerbated this issue and he does not want to take that anymore. He will use over-the-counter pain medication.  Consults: None  Significant Diagnostic Studies: CT  Treatments: IV hydration  Discharge Exam: Blood pressure 158/97, pulse 49, temperature 98.4 F (36.9 C), temperature source Oral, resp. rate 17, height 5\' 6"  (1.676 m), weight 57.425 kg (126 lb 9.6 oz), SpO2 100 %. General appearance: cooperative Resp: clear to auscultation bilaterally Cardio: regular rate and rhythm GI: soft, incisions CDI  Disposition: 01-Home or Self Care  Discharge Instructions    Call MD for:  persistant nausea and vomiting    Complete by:  As directed      Diet - low sodium heart healthy    Complete by:  As directed      Increase activity slowly    Complete by:  As directed      Lifting restrictions    Complete by:  As directed   No lifting over 10lb until 4 weeks after surgery     No dressing needed    Complete by:  As directed             Medication List    STOP taking these medications        oxyCODONE 5 MG immediate release tablet  Commonly known as:  Oxy IR/ROXICODONE       TAKE these medications        clonazePAM 1 MG tablet  Commonly known as:  KLONOPIN  Take 1 tablet (1 mg total) by mouth 2 (two) times daily.     diphenhydrAMINE 25 mg capsule  Commonly known as:  BENADRYL  Take 50 mg by mouth at bedtime as needed for sleep.     ibuprofen 200 MG tablet  Commonly known as:  ADVIL,MOTRIN  Take 400-800 mg by mouth every 6 (six) hours as needed for moderate pain.     LINZESS 290 MCG Caps capsule  Generic drug:  linaclotide  Take 290 mcg by mouth daily.     omeprazole 20 MG capsule  Commonly known as:  PRILOSEC  Take 1 capsule (20 mg total) by mouth daily.     pregabalin 50 MG capsule  Commonly known as:  LYRICA  Take 1 capsule (50 mg total) by mouth 3 (three) times daily.     promethazine 25 MG tablet  Commonly known as:  PHENERGAN  Take 25 mg by mouth every 8 (eight) hours as needed for nausea or vomiting.     sucralfate 1 GM/10ML suspension  Commonly known as:  CARAFATE  Take 10 mLs (1 g total) by mouth 4 (four) times daily -  with meals and at bedtime.         SignedVioleta Gelinas: Avis Mcmahill E 01/23/2016, 8:27 AM

## 2016-01-23 NOTE — Clinical Social Work Note (Signed)
CSW received consult for transportation needs for patient.  CSW provided a bus pass for patient to discharge.  Patient was appreciative of pass being given.  CSW to sign off, please reconsult if other social work needs arise.  Ervin KnackEric R. Emillie Chasen, MSW, Theresia MajorsLCSWA [phone] 01/23/2016 11:38 AM

## 2016-01-23 NOTE — Care Management Note (Signed)
Case Management Note  Patient Details  Name: Levi Palmer MRN: [id number] Date of Birth: 02-22-1986  Subjective/Objective:                    Action/Plan:  Patient active with [facility]  Expected Discharge Date:                  Expected Discharge Plan:  Home/Self Care  In-House Referral:     Discharge planning Services  CM Consult, [facility], [facility]  Post Acute Care Choice:    Choice offered to:  Patient  DME Arranged:    DME Agency:     HH Arranged:    HH Agency:     Status of Service:  Completed, signed off  If discussed at [employer] Length of Stay Meetings, dates discussed:    Additional Comments:  Kingsley PlanWile, Ginger Leeth Marie, RN 01/23/2016, 10:28 AM

## 2016-01-30 ENCOUNTER — Encounter (HOSPITAL_COMMUNITY): Payer: Self-pay

## 2016-01-30 ENCOUNTER — Emergency Department (HOSPITAL_COMMUNITY): Payer: Self-pay

## 2016-01-30 ENCOUNTER — Emergency Department (HOSPITAL_COMMUNITY)
Admission: EM | Admit: 2016-01-30 | Discharge: 2016-01-30 | Disposition: A | Discharge status: Home / Self Care | Payer: Self-pay | Attending: Emergency Medicine | Admitting: Emergency Medicine

## 2016-01-30 DIAGNOSIS — R112 Nausea with vomiting, unspecified: Secondary | ICD-10-CM | POA: Insufficient documentation

## 2016-01-30 DIAGNOSIS — M436 Torticollis: Secondary | ICD-10-CM | POA: Insufficient documentation

## 2016-01-30 LAB — COMPREHENSIVE METABOLIC PANEL
ALBUMIN: 4.7 g/dL (ref 3.5–5.0)
ALK PHOS: 47 U/L (ref 38–126)
ALT: 14 U/L — AB (ref 17–63)
ANION GAP: 12 (ref 5–15)
AST: 21 U/L (ref 15–41)
BUN: 13 mg/dL (ref 6–20)
CALCIUM: 9.9 mg/dL (ref 8.9–10.3)
CHLORIDE: 104 mmol/L (ref 101–111)
CO2: 23 mmol/L (ref 22–32)
Creatinine, Ser: 0.81 mg/dL (ref 0.61–1.24)
GFR calc non Af Amer: >60 mL/min (ref >60)
GLUCOSE: 94 mg/dL (ref 65–99)
Potassium: 4.2 mmol/L (ref 3.5–5.1)
SODIUM: 139 mmol/L (ref 135–145)
Total Bilirubin: 0.5 mg/dL (ref 0.3–1.2)
Total Protein: 7.4 g/dL (ref 6.5–8.1)

## 2016-01-30 LAB — LIPASE, BLOOD: LIPASE: 21 U/L (ref 11–51)

## 2016-01-30 LAB — CBC
HCT: 40.7 % (ref 39.0–52.0)
HEMOGLOBIN: 13.1 g/dL (ref 13.0–17.0)
MCH: 26.1 pg (ref 26.0–34.0)
MCHC: 32.2 g/dL (ref 30.0–36.0)
MCV: 81.2 fL (ref 78.0–100.0)
Platelets: 303 10*3/uL (ref 150–400)
RBC: 5.01 MIL/uL (ref 4.22–5.81)
RDW: 17.4 % — ABNORMAL HIGH (ref 11.5–15.5)
WBC: 8.4 10*3/uL (ref 4.0–10.5)

## 2016-01-30 MED ORDER — ONDANSETRON 4 MG PO TBDP: ORAL_TABLET | ORAL | 0 refills | Status: AC

## 2016-01-30 MED ORDER — MORPHINE SULFATE (PF) 4 MG/ML IV SOLN
4.0000 mg | Freq: Once | INTRAVENOUS | Status: AC
Start: 2016-01-30 — End: 2016-01-30
  Administered 2016-01-30: 4 mg via INTRAVENOUS
  Filled 2016-01-30: qty 1

## 2016-01-30 MED ORDER — DIAZEPAM 5 MG/ML IJ SOLN
5.0000 mg | Freq: Once | INTRAMUSCULAR | Status: AC
  Administered 2016-01-30: 5 mg via INTRAVENOUS
  Filled 2016-01-30: qty 2

## 2016-01-30 MED ORDER — MORPHINE SULFATE (PF) 4 MG/ML IV SOLN
4.0000 mg | Freq: Once | INTRAVENOUS | Status: AC
  Administered 2016-01-30: 4 mg via INTRAVENOUS
  Filled 2016-01-30: qty 1

## 2016-01-30 MED ORDER — HYDROCODONE-ACETAMINOPHEN 5-325 MG PO TABS: 1.0000 | ORAL_TABLET | Freq: Four times a day (QID) | ORAL | 0 refills | Status: AC | PRN

## 2016-01-30 MED ORDER — ONDANSETRON HCL 4 MG/2ML IJ SOLN
4.0000 mg | Freq: Once | INTRAMUSCULAR | Status: AC
  Administered 2016-01-30: 4 mg via INTRAVENOUS
  Filled 2016-01-30: qty 2

## 2016-01-30 MED ORDER — SODIUM CHLORIDE 0.9 % IV BOLUS (SEPSIS)
1000.0000 mL | Freq: Once | INTRAVENOUS | Status: AC
  Administered 2016-01-30: 1000 mL via INTRAVENOUS

## 2016-01-30 MED ORDER — DIAZEPAM 5 MG PO TABS: 5.0000 mg | ORAL_TABLET | Freq: Four times a day (QID) | ORAL | 0 refills | Status: AC | PRN

## 2016-01-30 NOTE — ED Triage Notes (Signed)
Patient here with recurrent abdominal pain and cramping for the past several days. Complains of nausea and vomiting with same and mild diarrhea. Reports recent admission for same. No vomiting on arival

## 2016-01-30 NOTE — ED Notes (Signed)
Levi Palmer; Transporter - returned pt back to room from x-ray.  

## 2016-01-30 NOTE — ED Notes (Signed)
[employer]; Transporter - returned pt back to room from x-ray.

## 2016-01-30 NOTE — ED Notes (Signed)
[employer]; Transporter, transporting pt to x-ray.  

## 2016-01-30 NOTE — ED Provider Notes (Signed)
[facility] DEPT Provider Note   CSN: [id number] Arrival date & time: 01/30/16  4098  First Provider Contact:  First MD Initiated Contact with Patient 01/30/16 0809        History   Chief Complaint Chief Complaint  Patient presents with  . Abdominal Pain  . Emesis    HPI  The history is provided by the patient.  Levi Palmer is a 30 y.o. male  history of anxiety, bipolar, history of adhesions with recent SBO here presenting with vomiting, neck pain. Patient states that he was recently admitted to the hospital for a small bowel obstruction. He had an NG tube placed and was discharged about week ago. He still has intermittent vomiting but he claims that this is baseline for him. He is still passing gas. He states that yesterday night he started developing left-sided neck pain. He states that it's hard for him to turn his neck due to some muscle spasms. Denies any fever or headaches. Denies any injury or trauma to the neck. He states that he has a history of cervical disc disease but didn't get any neurosurgical interventions so far.    Past Medical History:  Diagnosis Date  . Anemia   . Anxiety   . Bipolar disorder (HCC)   . Congenital prolapsed rectum   . Constipation   . Depression    at times  . Dysrhythmia    irregular heart rate - due to zoloft  . Gallstones   . GERD (gastroesophageal reflux disease)   . History of bronchitis   . Liver lesion   . Medical history non-contributory   . Panic attack as reaction to stress   . Pneumomediastinum (HCC)    2-3 years ago    Patient Active Problem List   Diagnosis Date Noted  . SBO (small bowel obstruction) (HCC) 01/20/2016  . Ileus, postoperative 01/20/2016  . 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 Cervical disc disease with myelopathy 11/19/2015  . Passive suicidal ideations 11/08/2015  . Esophagitis 10/26/2015  . Paresthesia of right leg 09/20/2015  . Anemia, iron deficiency 07/12/2015  . Left knee pain  05/29/2015  . 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 Trigger ring finger of right hand 02/28/2015  . Chronic abdominal pain 02/26/2015  . Neck pain 03/08/2014  . Suicidal ideation 11/25/2013  . Cyclical vomiting syndrome 11/25/2013  . Panic disorder 11/25/2013  . Depression 07/30/2013  . Anxiety 07/30/2013  . Chest wall muscle strain 06/13/2013  . Pain, dental 06/13/2013    Past Surgical History:  Procedure Laterality Date  . CHOLECYSTECTOMY    . CHOLECYSTECTOMY N/A 01/15/2016   Procedure: EXPLORATORY LAPAROSCOPY;  Surgeon: Violeta Gelinas, MD;  [facility] OR;  Service: General;  Laterality: N/A;  . ESOPHAGOGASTRODUODENOSCOPY N/A 05/22/2015   Procedure: ESOPHAGOGASTRODUODENOSCOPY (EGD);  Surgeon: Vida Rigger, MD;  Location: [facility] ENDOSCOPY;  Service: Endoscopy;  Laterality: N/A;  . INCISE AND DRAIN ABCESS    . LAPAROSCOPIC LYSIS OF ADHESIONS N/A 01/15/2016   Procedure: LAPAROSCOPIC LYSIS OF ADHESIONS times twenty minutes;  Surgeon: Violeta Gelinas, MD;  Location: [facility] OR;  Service: General;  Laterality: N/A;  . MOUTH SURGERY    . TONSILLECTOMY         Home Medications    Prior to Admission medications   Medication Sig Start Date End Date Taking? Authorizing Provider  clonazePAM (KLONOPIN) 1 MG tablet Take 1 tablet (1 mg total) by mouth 2 (two) times daily. 11/01/15  Yes Josalyn Funches, MD  diphenhydrAMINE (BENADRYL) 25 mg  capsule Take 50 mg by mouth at bedtime as needed for sleep.   Yes Historical Provider, MD  DULoxetine (CYMBALTA) 30 MG capsule Take 30 mg by mouth daily.   Yes Historical Provider, MD  ibuprofen (ADVIL,MOTRIN) 200 MG tablet Take 400-800 mg by mouth every 6 (six) hours as needed for moderate pain.    Yes Historical Provider, MD  promethazine (PHENERGAN) 25 MG tablet Take 25 mg by mouth every 8 (eight) hours as needed for nausea or vomiting.  10/25/15  Yes Historical Provider, MD  LINZESS 290 MCG CAPS capsule Take 290 mcg by mouth daily. 07/16/15   Historical Provider,  MD  omeprazole (PRILOSEC) 20 MG capsule Take 1 capsule (20 mg total) by mouth daily. Patient not taking: Reported on 01/30/2016 12/28/15   Tilden Fossa, MD  pregabalin (LYRICA) 50 MG capsule Take 1 capsule (50 mg total) by mouth 3 (three) times daily. Patient not taking: Reported on 01/30/2016 09/20/15   Dessa Phi, MD  sucralfate (CARAFATE) 1 GM/10ML suspension Take 10 mLs (1 g total) by mouth 4 (four) times daily -  with meals and at bedtime. Patient not taking: Reported on 01/30/2016 12/28/15   Tilden Fossa, MD    Family History Family History  Problem Relation Age of Onset  . Irritable bowel syndrome Mother   . Kidney disease Mother   . Ulcerative colitis Maternal Grandfather   . Colon cancer Neg Hx   . Colon polyps Neg Hx   . Gallbladder disease Neg Hx   . Esophageal cancer Neg Hx   . Diabetes Neg Hx     Social History Social History  Substance Use Topics  . Smoking status: Never Smoker  . Smokeless tobacco: Never Used  . Alcohol use No     Allergies   Reglan [metoclopramide]; Toradol [ketorolac tromethamine]; Tramadol; and Zoloft [sertraline hcl]   Review of Systems Review of Systems  Gastrointestinal: Positive for abdominal pain and vomiting.  Musculoskeletal: Positive for neck pain.  All other systems reviewed and are negative.    Physical Exam Updated Vital Signs BP 140/96   Pulse 77   Temp 97.6 F (36.4 C) (Oral)   Resp 18   SpO2 100%   Physical Exam  Constitutional: He appears well-nourished.  Uncomfortable   HENT:  Head: Normocephalic.  Mouth/Throat: Oropharynx is clear and moist.  Eyes: EOM are normal. Pupils are equal, round, and reactive to light.  Neck:  L SCM tender with some spasms. No midline tenderness. Difficulty ranging to the right due to muscle spasms   Cardiovascular: Normal rate.   Pulmonary/Chest: Effort normal and breath sounds normal.  Abdominal: Soft. Bowel sounds are normal. He exhibits no distension. There is no  tenderness.  Musculoskeletal: Normal range of motion.  Neurological: He is alert.  Skin: Skin is warm.  Psychiatric: He has a normal mood and affect.  Nursing note and vitals reviewed.    ED Treatments / Results  Labs (all labs ordered are listed, but only abnormal results are displayed) Labs Reviewed  COMPREHENSIVE METABOLIC PANEL - Abnormal; Notable for the following:       Result Value   ALT 14 (*)    All other components within normal limits  CBC - Abnormal; Notable for the following:    RDW 17.4 (*)    All other components within normal limits  LIPASE, BLOOD    EKG  EKG Interpretation None       Radiology Dg Cervical Spine Complete  Result Date: 01/30/2016 CLINICAL DATA:  Cervical neck pain EXAM: CERVICAL SPINE - COMPLETE 4+ VIEW COMPARISON:  11/13/1998 septated FINDINGS: Normal cervical spine alignment. Facets are aligned. Foramina appear patent. Odontoid is intact. Minor degenerative spondylosis at C4-5 and C5-6. No acute osseous finding. Normal prevertebral soft tissues. Trachea is midline. Lung apices are clear. IMPRESSION: Minor mid to lower cervical degenerative spondylosis. No acute osseous finding or malalignment. Electronically Signed   By: Judie Petit.  Shick M.D.   On: 01/30/2016 10:47  Dg Abd Acute W/chest  Result Date: 01/30/2016 CLINICAL DATA:  Chronic generalized abdominal pain, nausea, vomiting. EXAM: DG ABDOMEN ACUTE W/ 1V CHEST COMPARISON:  Radiograph of January 21, 2016. FINDINGS: There is no evidence of dilated bowel loops or free intraperitoneal air. No radiopaque calculi or other significant radiographic abnormality is seen. Heart size and mediastinal contours are within normal limits. Status post cholecystectomy. Both lungs are clear. IMPRESSION: No evidence of bowel obstruction or ileus. No acute cardiopulmonary disease. Electronically Signed   By: Lupita Raider, M.D.   On: 01/30/2016 08:41   Procedures Procedures (including critical care  time)  Medications Ordered in ED Medications  sodium chloride 0.9 % bolus 1,000 mL (0 mLs Intravenous Stopped 01/30/16 0956)  ondansetron (ZOFRAN) injection 4 mg (4 mg Intravenous Given 01/30/16 0910)  morphine 4 MG/ML injection 4 mg (4 mg Intravenous Given 01/30/16 0956)  diazepam (VALIUM) injection 5 mg (5 mg Intravenous Given 01/30/16 0956)  morphine 4 MG/ML injection 4 mg (4 mg Intravenous Given 01/30/16 1127)     Initial Impression / Assessment and Plan / ED Course  I have reviewed the triage vital signs and the nursing notes.  Pertinent labs & imaging results that were available during my care of the patient were reviewed by me and considered in my medical decision making (see chart for details).  Clinical Course   Levi Palmer is a 30 y.o. male hx of SBO here with neck pain, vomiting. L sided neck pain, likely torticollis. No neck injury, nl neuro exam. Also has been vomiting but not as severe as previous and still passing gas. States that he has chronic vomiting and abdomen not distended. Will get cervical xray, labs, acute abdominal series.   11:30 AM xrays showed no SBO. Xray neck showed degenerative disease. Pain improved. Able to range neck better. No vomiting in the ED. Ran out of zofran. Will dc home with vicodin, zofran, valium.    Final Clinical Impressions(s) / ED Diagnoses   Final diagnoses:  None    New Prescriptions New Prescriptions   No medications on file     Charlynne Pander, MD 01/30/16 1131

## 2016-01-30 NOTE — Discharge Instructions (Signed)
Stay hydrated.   Continue phenergan at home. Take zofran as needed.   Take vicodin for severe pain.   Take valium for spasms.   Don't drive when you take vicodin or valium.   See your doctor.   Return to ER if you have worse vomiting, abdominal pain or distention, neck pain or spasms, fever, headaches.

## 2016-02-04 ENCOUNTER — Encounter (HOSPITAL_COMMUNITY): Payer: Self-pay | Admitting: Emergency Medicine

## 2016-02-04 ENCOUNTER — Emergency Department (HOSPITAL_COMMUNITY)
Admission: EM | Admit: 2016-02-04 | Discharge: 2016-02-04 | Disposition: A | Discharge status: Home / Self Care | Payer: Self-pay | Attending: Emergency Medicine | Admitting: Emergency Medicine

## 2016-02-04 DIAGNOSIS — M542 Cervicalgia: Secondary | ICD-10-CM | POA: Insufficient documentation

## 2016-02-04 DIAGNOSIS — G8929 Other chronic pain: Secondary | ICD-10-CM | POA: Insufficient documentation

## 2016-02-04 MED ORDER — PREDNISONE 10 MG (21) PO TBPK: 10.0000 mg | ORAL_TABLET | Freq: Every day | ORAL | 0 refills | Status: DC

## 2016-02-04 MED ORDER — GABAPENTIN 100 MG PO CAPS: 100.0000 mg | ORAL_CAPSULE | Freq: Two times a day (BID) | ORAL | 0 refills | Status: DC | PRN

## 2016-02-04 MED ORDER — CYCLOBENZAPRINE HCL 10 MG PO TABS
10.0000 mg | ORAL_TABLET | Freq: Once | ORAL | Status: AC
  Administered 2016-02-04: 10 mg via ORAL
  Filled 2016-02-04: qty 1

## 2016-02-04 MED ORDER — CYCLOBENZAPRINE HCL 10 MG PO TABS: 10.0000 mg | ORAL_TABLET | Freq: Two times a day (BID) | ORAL | 0 refills | Status: AC | PRN

## 2016-02-04 NOTE — ED Triage Notes (Signed)
PT reports he  Has 3 bottles of Neurontin  At home. Pt stated " Neurontin is a dangerous  Drug that is used for seizures . I am  Not going to take it."  Pt also reported  He recently finished a steroid taper and it did not help with pain.Marland Kitchen
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
## 2016-02-04 NOTE — Discharge Instructions (Signed)
Read the information below.  Use the prescribed medication as directed.  Please discuss all new medications with your pharmacist.  You may return to the Emergency Department at any time for worsening condition or any new symptoms that concern you.    If you develop fevers, loss of control of bowel or bladder, weakness or numbness in your legs, or are unable to walk, return to the ER for a recheck.  °

## 2016-02-04 NOTE — ED Provider Notes (Signed)
[facility] Provider Note   CSN: [id number] Arrival date & time: 02/04/16  4098  First Provider Contact:  First MD Initiated Contact with Patient 02/04/16 [phone]        History   Chief Complaint Chief Complaint  Patient presents with  . Neck Pain    HPI Levi Palmer is a 30 y.o. male.  HPI   Patient presents with chronic neck pain with radiation down both arms, also with some tingling in his right thigh.  Has known disc protrusion at C5-6 and spinal stenosis from MRI 11/2015, has been seen by PCP and ortho for same.  Presents today for slight increase in his pain.  Was seen in ED 7/26 for neck pain was given norco and valium.  Denies any new injury or fall.  No fevers or weakness.  States he was fired from his PCP [employer]) and has been unable to schedule an appointment with sickle cell clinic either as they have the same Wellsite geologist.  Has follow up with Lavinia Sharps at [facility] in late August.    Past Medical History:  Diagnosis Date  . Anemia   . Anxiety   . Bipolar disorder (HCC)   . Congenital prolapsed rectum   . Constipation   . Depression    at times  . Dysrhythmia    irregular heart rate - due to zoloft  . Gallstones   . GERD (gastroesophageal reflux disease)   . History of bronchitis   . Liver lesion   . Medical history non-contributory   . Panic attack as reaction to stress   . Pneumomediastinum (HCC)    2-3 years ago    Patient Active Problem List   Diagnosis Date Noted  . SBO (small bowel obstruction) (HCC) 01/20/2016  . Ileus, postoperative 01/20/2016  . 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 Cervical disc disease with myelopathy 11/19/2015  . Passive suicidal ideations 11/08/2015  . Esophagitis 10/26/2015  . Paresthesia of right leg 09/20/2015  . Anemia, iron deficiency 07/12/2015  . Left knee pain 05/29/2015  . 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 Trigger ring finger of right hand 02/28/2015  . Chronic abdominal pain  02/26/2015  . Neck pain 03/08/2014  . Suicidal ideation 11/25/2013  . Cyclical vomiting syndrome 11/25/2013  . Panic disorder 11/25/2013  . Depression 07/30/2013  . Anxiety 07/30/2013  . Chest wall muscle strain 06/13/2013  . Pain, dental 06/13/2013    Past Surgical History:  Procedure Laterality Date  . CHOLECYSTECTOMY    . CHOLECYSTECTOMY N/A 01/15/2016   Procedure: EXPLORATORY LAPAROSCOPY;  Surgeon: Violeta Gelinas, MD;  Location[facility] OR;  Service: General;  Laterality: N/A;  . ESOPHAGOGASTRODUODENOSCOPY N/A 05/22/2015   Procedure: ESOPHAGOGASTRODUODENOSCOPY (EGD);  Surgeon: Vida Rigger, MD;  Location: [facility] ENDOSCOPY;  Service: Endoscopy;  Laterality: N/A;  . INCISE AND DRAIN ABCESS    . LAPAROSCOPIC LYSIS OF ADHESIONS N/A 01/15/2016   Procedure: LAPAROSCOPIC LYSIS OF ADHESIONS times twenty minutes;  Surgeon: Violeta Gelinas, MD;  Location: [facility] OR;  Service: General;  Laterality: N/A;  . MOUTH SURGERY    . TONSILLECTOMY         Home Medications    Prior to Admission medications   Medication Sig Start Date End Date Taking? Authorizing Provider  clonazePAM (KLONOPIN) 1 MG tablet Take 1 tablet (1 mg total) by mouth 2 (two) times daily. 11/01/15   Josalyn Funches, MD  diazepam (VALIUM) 5 MG tablet Take 1 tablet (5 mg total) by mouth every 6 (  six) hours as needed for anxiety (spasms). 01/30/16   Charlynne Pander, MD  diphenhydrAMINE (BENADRYL) 25 mg capsule Take 50 mg by mouth at bedtime as needed for sleep.    Historical Provider, MD  DULoxetine (CYMBALTA) 30 MG capsule Take 30 mg by mouth daily.    Historical Provider, MD  gabapentin (NEURONTIN) 100 MG capsule Take 1 capsule (100 mg total) by mouth 2 (two) times daily as needed (pain). 02/04/16   Trixie Dredge, PA-C  HYDROcodone-acetaminophen (NORCO/VICODIN) 5-325 MG tablet Take 1 tablet by mouth every 6 (six) hours as needed. 01/30/16   Charlynne Pander, MD  ibuprofen (ADVIL,MOTRIN) 200 MG tablet Take 400-800 mg by mouth every 6 (six) hours  as needed for moderate pain.     Historical Provider, MD  LINZESS 290 MCG CAPS capsule Take 290 mcg by mouth daily. 07/16/15   Historical Provider, MD  omeprazole (PRILOSEC) 20 MG capsule Take 1 capsule (20 mg total) by mouth daily. Patient not taking: Reported on 01/30/2016 12/28/15   Tilden Fossa, MD  ondansetron [facility] ODT) 4 MG disintegrating tablet 4mg  ODT q4 hours prn nausea/vomit 01/30/16   Charlynne Pander, MD  predniSONE (STERAPRED UNI-PAK 21 TAB) 10 MG (21) TBPK tablet Take 1 tablet (10 mg total) by mouth daily. Day 1: take 6 tabs.  Day 2: 5 tabs  Day 3: 4 tabs  Day 4: 3 tabs  Day 5: 2 tabs  Day 6: 1 tab 02/04/16   Trixie Dredge, PA-C  pregabalin (LYRICA) 50 MG capsule Take 1 capsule (50 mg total) by mouth 3 (three) times daily. Patient not taking: Reported on 01/30/2016 09/20/15   Dessa Phi, MD  promethazine (PHENERGAN) 25 MG tablet Take 25 mg by mouth every 8 (eight) hours as needed for nausea or vomiting.  10/25/15   Historical Provider, MD  sucralfate (CARAFATE) 1 GM/10ML suspension Take 10 mLs (1 g total) by mouth 4 (four) times daily -  with meals and at bedtime. Patient not taking: Reported on 01/30/2016 12/28/15   Tilden Fossa, MD    Family History Family History  Problem Relation Age of Onset  . Irritable bowel syndrome Mother   . Kidney disease Mother   . Ulcerative colitis Maternal Grandfather   . Colon cancer Neg Hx   . Colon polyps Neg Hx   . Gallbladder disease Neg Hx   . Esophageal cancer Neg Hx   . Diabetes Neg Hx     Social History Social History  Substance Use Topics  . Smoking status: Never Smoker  . Smokeless tobacco: Never Used  . Alcohol use No     Allergies   Reglan [metoclopramide]; Toradol [ketorolac tromethamine]; Tramadol; and Zoloft [sertraline hcl]   Review of Systems Review of Systems  All other systems reviewed and are negative.    Physical Exam Updated Vital Signs BP 126/83 (BP Location: Left Arm)   Pulse 74   Temp 98.3 F (36.8  C) (Oral)   Resp 20   Wt 56.8 kg   SpO2 99%   BMI 20.23 kg/m   Physical Exam  Constitutional: He appears well-developed and well-nourished. No distress.  HENT:  Head: Normocephalic and atraumatic.  Neck: Neck supple.  Pulmonary/Chest: Effort normal.  Abdominal: Soft. He exhibits no distension. There is tenderness (mild). There is no guarding.  Recent laparoscopic surgical incisions healing well without erythema, edema, warmth, discharge.    Musculoskeletal:  Tenderness throughout midback and posterior neck.  No focal tenderness, skin changes, crepitus, or stepoffs.  Upper extremities:  Strength 5/5, sensation intact, distal pulses intact.     Neurological: He is alert.  Skin: He is not diaphoretic.  Nursing note and vitals reviewed.    ED Treatments / Results  Labs (all labs ordered are listed, but only abnormal results are displayed) Labs Reviewed - No data to display  EKG  EKG Interpretation None       Radiology No results found.  Procedures Procedures (including critical care time)  Medications Ordered in ED Medications - No data to display   Initial Impression / Assessment and Plan / ED Course  I have reviewed the triage vital signs and the nursing notes.  Pertinent labs & imaging results that were available during my care of the patient were reviewed by me and considered in my medical decision making (see chart for details).  Clinical Course   Afebrile, nontoxic patient with chronic neck pain with radiculopathy this is unchanged.  No weakness on exam.  No new injury.  No e/o infectious symptoms.  Pt refused initial prescriptions of gabapentin and steroids because he states they don't work.  Pt made aware that I am unable to prescribe narcotic medications for chronic pain and advised close follow up with PCP - case manager assisted and spoke with patient's PCP Lavinia Sharps who stated he could come see her today or Wednesday.  Has been recommended to go to  pain management previously.  Has also see orthopedics for this.  Stated flexeril provided small amount of relief, provided small number of pills with dose here.   D/C home with PCP follow up.  Discussed result, findings, treatment, and follow up  with patient.  Pt given return precautions.  Pt verbalizes understanding and agrees with plan.       Final Clinical Impressions(s) / ED Diagnoses   Final diagnoses:  Chronic neck pain    New Prescriptions New Prescriptions   GABAPENTIN (NEURONTIN) 100 MG CAPSULE    Take 1 capsule (100 mg total) by mouth 2 (two) times daily as needed (pain).   PREDNISONE (STERAPRED UNI-PAK 21 TAB) 10 MG (21) TBPK TABLET    Take 1 tablet (10 mg total) by mouth daily. Day 1: take 6 tabs.  Day 2: 5 tabs  Day 3: 4 tabs  Day 4: 3 tabs  Day 5: 2 tabs  Day 6: 1 tab     Trixie Dredge, PA-C 02/04/16 1507    Doug Sou, MD 02/04/16 1757

## 2016-02-04 NOTE — ED Triage Notes (Signed)
PT at bed side to review DC RX.

## 2016-02-04 NOTE — ED Triage Notes (Signed)
PT reports he has results of a MRI  That shows bulging disc at C5-7.  Pt reports pain worse.

## 2016-02-04 NOTE — ED Triage Notes (Signed)
PA E.West PA  Talked to PT about  DC RX and plan .  THE Pt  Requested  meds while her . This information was reported to PA.

## 2016-02-04 NOTE — ED Notes (Signed)
Declined W/C at D/C and was escorted to lobby by RN. 

## 2016-02-04 NOTE — ED Triage Notes (Signed)
Patient reports pain at left lateral neck radiating to left arm onset Thursday , denies injury or fall . Pain increases with movement .

## 2016-02-07 ENCOUNTER — Ambulatory Visit (HOSPITAL_COMMUNITY): Payer: Self-pay | Admitting: Psychiatry

## 2016-03-05 ENCOUNTER — Encounter (HOSPITAL_COMMUNITY): Payer: Self-pay

## 2016-03-05 ENCOUNTER — Emergency Department (HOSPITAL_COMMUNITY)
Admission: EM | Admit: 2016-03-05 | Discharge: 2016-03-05 | Disposition: A | Discharge status: Home / Self Care | Payer: Self-pay | Attending: Emergency Medicine | Admitting: Emergency Medicine

## 2016-03-05 ENCOUNTER — Emergency Department (HOSPITAL_COMMUNITY): Payer: Self-pay

## 2016-03-05 DIAGNOSIS — M25562 Pain in left knee: Secondary | ICD-10-CM

## 2016-03-05 DIAGNOSIS — W228XXA Striking against or struck by other objects, initial encounter: Secondary | ICD-10-CM | POA: Insufficient documentation

## 2016-03-05 DIAGNOSIS — Y9302 Activity, running: Secondary | ICD-10-CM | POA: Insufficient documentation

## 2016-03-05 DIAGNOSIS — R1110 Vomiting, unspecified: Secondary | ICD-10-CM | POA: Insufficient documentation

## 2016-03-05 DIAGNOSIS — Y999 Unspecified external cause status: Secondary | ICD-10-CM | POA: Insufficient documentation

## 2016-03-05 DIAGNOSIS — Z79899 Other long term (current) drug therapy: Secondary | ICD-10-CM | POA: Insufficient documentation

## 2016-03-05 DIAGNOSIS — Y929 Unspecified place or not applicable: Secondary | ICD-10-CM | POA: Insufficient documentation

## 2016-03-05 NOTE — Discharge Instructions (Signed)
Ibuprofen 600 g every 6 hours as needed for pain.  Follow-up with orthopedics if you're not improving in the next week. The contact information for Dr. Dion SaucierLandau has been provided in this discharge summary for you to call and make these arrangements.
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
## 2016-03-05 NOTE — ED Provider Notes (Signed)
[facility]ENCY DEPT Provider Note   CSN: [id number] Arrival date & time: 03/05/16  0746     History   Chief Complaint Chief Complaint  Patient presents with  . Knee Pain  . Emesis    HPI Levi Palmer is a 30 y.o. male.  Patient is a 30 year old male with past medical history of chronic abdominal pain, chronic neck pain, and depression. He presents for evaluation of left knee pain. He reports running into his girlfriend 2 weeks ago and striking the front of his knee. Since that time he has been experiencing discomfort in this knee. His pain is worse with ambulation and movement.  He also reports nausea with bowel movements, however no fever or bleeding.   He is also reporting a worsening of his chronic neck pain in the absence of any injury or trauma. There is no numbness or tingling.   The history is provided by the patient.  Knee Pain   This is a new problem. Episode onset: 2 weeks ago. The problem occurs constantly. The problem has been gradually worsening. The pain is present in the left knee. The pain is moderate. He has tried nothing for the symptoms.  Emesis      Past Medical History:  Diagnosis Date  . Anemia   . Anxiety   . Bipolar disorder (HCC)   . Congenital prolapsed rectum   . Constipation   . Depression    at times  . Dysrhythmia    irregular heart rate - due to zoloft  . Gallstones   . GERD (gastroesophageal reflux disease)   . History of bronchitis   . Liver lesion   . Medical history non-contributory   . Panic attack as reaction to stress   . Pneumomediastinum (HCC)    2-3 years ago    Patient Active Problem List   Diagnosis Date Noted  . SBO (small bowel obstruction) (HCC) 01/20/2016  . Ileus, postoperative 01/20/2016  . 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 Cervical disc disease with myelopathy 11/19/2015  . Passive suicidal ideations 11/08/2015  . Esophagitis 10/26/2015  . Paresthesia of right leg 09/20/2015  . Anemia, iron  deficiency 07/12/2015  . Left knee pain 05/29/2015  . 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 Trigger ring finger of right hand 02/28/2015  . Chronic abdominal pain 02/26/2015  . Neck pain 03/08/2014  . Suicidal ideation 11/25/2013  . Cyclical vomiting syndrome 11/25/2013  . Panic disorder 11/25/2013  . Depression 07/30/2013  . Anxiety 07/30/2013  . Chest wall muscle strain 06/13/2013  . Pain, dental 06/13/2013    Past Surgical History:  Procedure Laterality Date  . CHOLECYSTECTOMY    . CHOLECYSTECTOMY N/A 01/15/2016   Procedure: EXPLORATORY LAPAROSCOPY;  Surgeon: Violeta GelinasBurke Thompson, MD;  [facility];  Service: General;  Laterality: N/A;  . ESOPHAGOGASTRODUODENOSCOPY N/A 05/22/2015   Procedure: ESOPHAGOGASTRODUODENOSCOPY (EGD);  Surgeon: Vida RiggerMarc Magod, MD;  [facility];  Service: Endoscopy;  Laterality: N/A;  . INCISE AND DRAIN ABCESS    . LAPAROSCOPIC LYSIS OF ADHESIONS N/A 01/15/2016   Procedure: LAPAROSCOPIC LYSIS OF ADHESIONS times twenty minutes;  Surgeon: Violeta GelinasBurke Thompson, MD;  [facility];  Service: General;  Laterality: N/A;  . MOUTH SURGERY    . TONSILLECTOMY         Home Medications    Prior to Admission medications   Medication Sig Start Date End Date Taking? Authorizing Provider  clonazePAM (KLONOPIN) 1 MG tablet Take 1 tablet (1 mg total) by mouth 2 (two) times  daily. 11/01/15   Dessa Phi, MD  cyclobenzaprine (FLEXERIL) 10 MG tablet Take 1 tablet (10 mg total) by mouth 2 (two) times daily as needed for muscle spasms (and chronic pain). 02/04/16   Trixie Dredge, PA-C  diazepam (VALIUM) 5 MG tablet Take 1 tablet (5 mg total) by mouth every 6 (six) hours as needed for anxiety (spasms). 01/30/16   Charlynne Pander, MD  diphenhydrAMINE (BENADRYL) 25 mg capsule Take 50 mg by mouth at bedtime as needed for sleep.    Historical Provider, MD  DULoxetine (CYMBALTA) 30 MG capsule Take 30 mg by mouth daily.    Historical Provider, MD  HYDROcodone-acetaminophen  (NORCO/VICODIN) 5-325 MG tablet Take 1 tablet by mouth every 6 (six) hours as needed. 01/30/16   Charlynne Pander, MD  ibuprofen (ADVIL,MOTRIN) 200 MG tablet Take 400-800 mg by mouth every 6 (six) hours as needed for moderate pain.     Historical Provider, MD  LINZESS 290 MCG CAPS capsule Take 290 mcg by mouth daily. 07/16/15   Historical Provider, MD  omeprazole (PRILOSEC) 20 MG capsule Take 1 capsule (20 mg total) by mouth daily. Patient not taking: Reported on 01/30/2016 12/28/15   Tilden Fossa, MD  ondansetron [facility] ODT) 4 MG disintegrating tablet 4mg  ODT q4 hours prn nausea/vomit 01/30/16   Charlynne Pander, MD  pregabalin (LYRICA) 50 MG capsule Take 1 capsule (50 mg total) by mouth 3 (three) times daily. Patient not taking: Reported on 01/30/2016 09/20/15   Dessa Phi, MD  promethazine (PHENERGAN) 25 MG tablet Take 25 mg by mouth every 8 (eight) hours as needed for nausea or vomiting.  10/25/15   Historical Provider, MD  sucralfate (CARAFATE) 1 GM/10ML suspension Take 10 mLs (1 g total) by mouth 4 (four) times daily -  with meals and at bedtime. Patient not taking: Reported on 01/30/2016 12/28/15   Tilden Fossa, MD    Family History Family History  Problem Relation Age of Onset  . Irritable bowel syndrome Mother   . Kidney disease Mother   . Ulcerative colitis Maternal Grandfather   . Colon cancer Neg Hx   . Colon polyps Neg Hx   . Gallbladder disease Neg Hx   . Esophageal cancer Neg Hx   . Diabetes Neg Hx     Social History Social History  Substance Use Topics  . Smoking status: Never Smoker  . Smokeless tobacco: Never Used  . Alcohol use No     Allergies   Reglan [metoclopramide]; Toradol [ketorolac tromethamine]; Tramadol; and Zoloft [sertraline hcl]   Review of Systems Review of Systems  Gastrointestinal: Positive for vomiting.  All other systems reviewed and are negative.    Physical Exam Updated Vital Signs BP 139/97 (BP Location: Left Arm)   Pulse 104    Temp 98 F (36.7 C) (Oral)   Resp 18   Ht 5\' 7"  (1.702 m)   Wt 135 lb (61.2 kg)   SpO2 98%   BMI 21.14 kg/m   Physical Exam  Constitutional: He is oriented to person, place, and time. He appears well-developed and well-nourished. No distress.  HENT:  Head: Normocephalic and atraumatic.  Mouth/Throat: Oropharynx is clear and moist.  Neck: Normal range of motion. Neck supple.  Cardiovascular: Normal rate and regular rhythm.  Exam reveals no friction rub.   No murmur heard. Pulmonary/Chest: Effort normal and breath sounds normal. No respiratory distress. He has no wheezes. He has no rales.  Abdominal: Soft. Bowel sounds are normal. He exhibits no distension.  There is no tenderness.  Musculoskeletal: Normal range of motion. He exhibits no edema.  The left knee appears grossly normal. There is no effusion. He has good range of motion with no crepitus. There is no laxity with varus or valgus stress. Anterior and posterior drawer tests are negative.  Neurological: He is alert and oriented to person, place, and time. Coordination normal.  Skin: Skin is warm and dry. He is not diaphoretic.  Nursing note and vitals reviewed.    ED Treatments / Results  Labs (all labs ordered are listed, but only abnormal results are displayed) Labs Reviewed - No data to display  EKG  EKG Interpretation None       Radiology Dg Knee Complete 4 Views Left  Result Date: 03/05/2016 CLINICAL DATA:  Knee pain.  No known injury . EXAM: LEFT KNEE - COMPLETE 4+ VIEW COMPARISON:  MRI 10/25/2015. FINDINGS: No acute bony or joint abnormality identified. No evidence of fracture or dislocation. IMPRESSION: No acute or focal abnormality. Electronically Signed   By: Maisie Fus  Register   On: 03/05/2016 08:27    Procedures Procedures (including critical care time)  Medications Ordered in ED Medications - No data to display   Initial Impression / Assessment and Plan / ED Course  I have reviewed the triage  vital signs and the nursing notes.  Pertinent labs & imaging results that were available during my care of the patient were reviewed by me and considered in my medical decision making (see chart for details).  Clinical Course    This patient's abdominal pain and neck pain appear chronic in nature and I see no indication for any workup into this.  His left knee appears grossly normal with no evidence for ligamentous instability or internal derangement. He is reporting pain which is out of proportion with his exam. He will be placed into an Ace bandage and recommended to take anti-inflammatories. If he is not improving in the next week he can follow-up with an orthopedist.  Final Clinical Impressions(s) / ED Diagnoses   Final diagnoses:  None    New Prescriptions New Prescriptions   No medications on file     Geoffery Lyons, MD 03/05/16 [phone]

## 2016-06-11 DIAGNOSIS — F419 Anxiety disorder, unspecified: Secondary | ICD-10-CM | POA: Insufficient documentation

## 2016-07-10 DIAGNOSIS — G8929 Other chronic pain: Secondary | ICD-10-CM | POA: Insufficient documentation

## 2016-07-10 DIAGNOSIS — M5030 Other cervical disc degeneration, unspecified cervical region: Secondary | ICD-10-CM | POA: Insufficient documentation

## 2016-07-28 IMAGING — CR DG ABDOMEN ACUTE W/ 1V CHEST
3 series · 3 of 3 positions shown · non-contrast
Comparison: Recent prior imaging CT abdomen/pelvis 06/28/2015

CLINICAL DATA: 21-year-old male with reflux and mid abdominal pain

EXAM:
DG ABDOMEN ACUTE W/ 1V CHEST

[x chest ap]
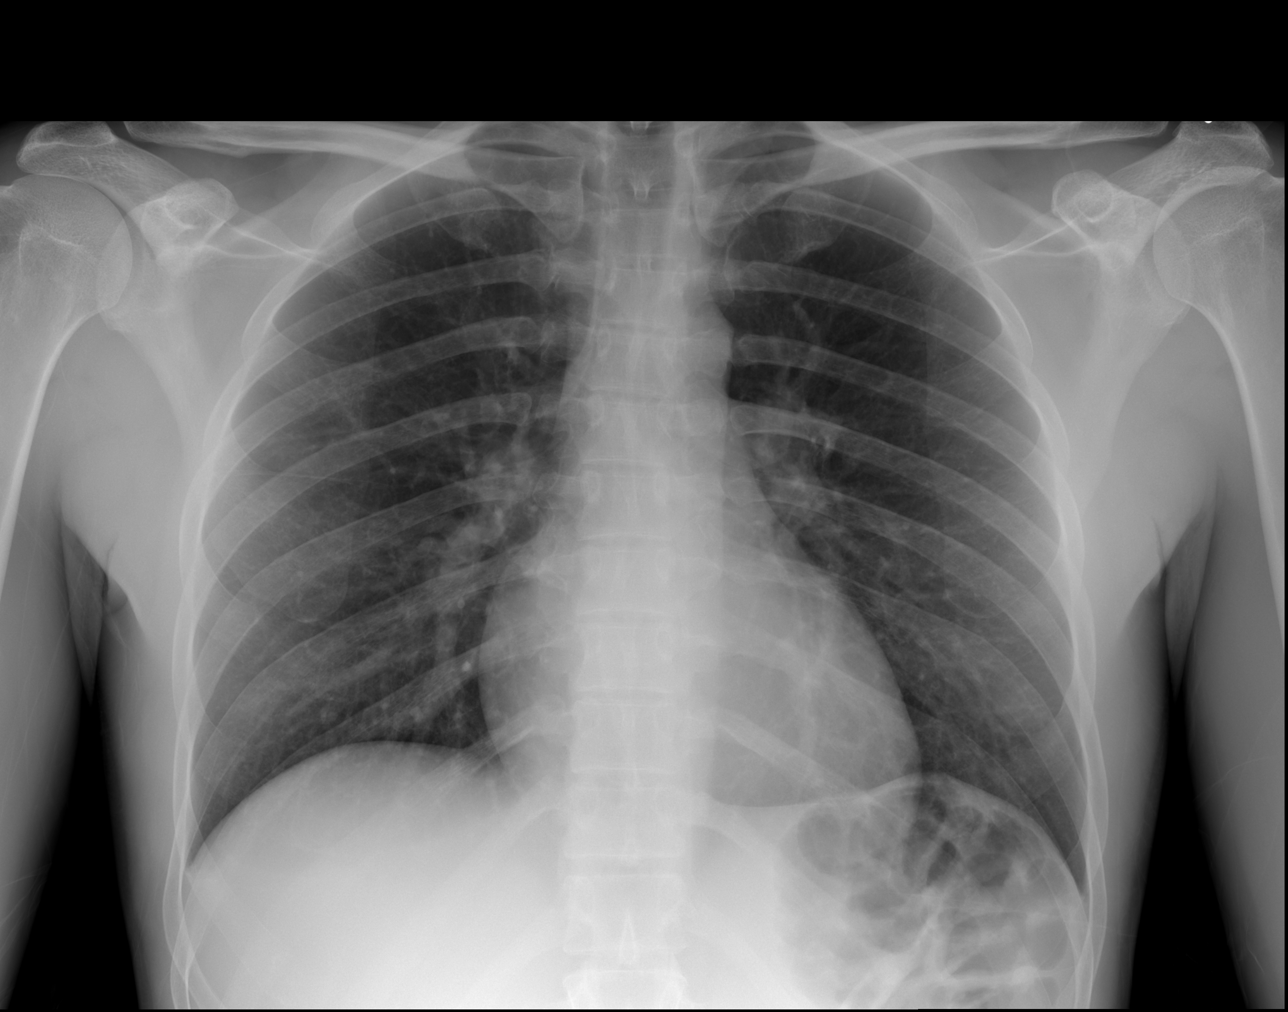

[x abdomen supine]
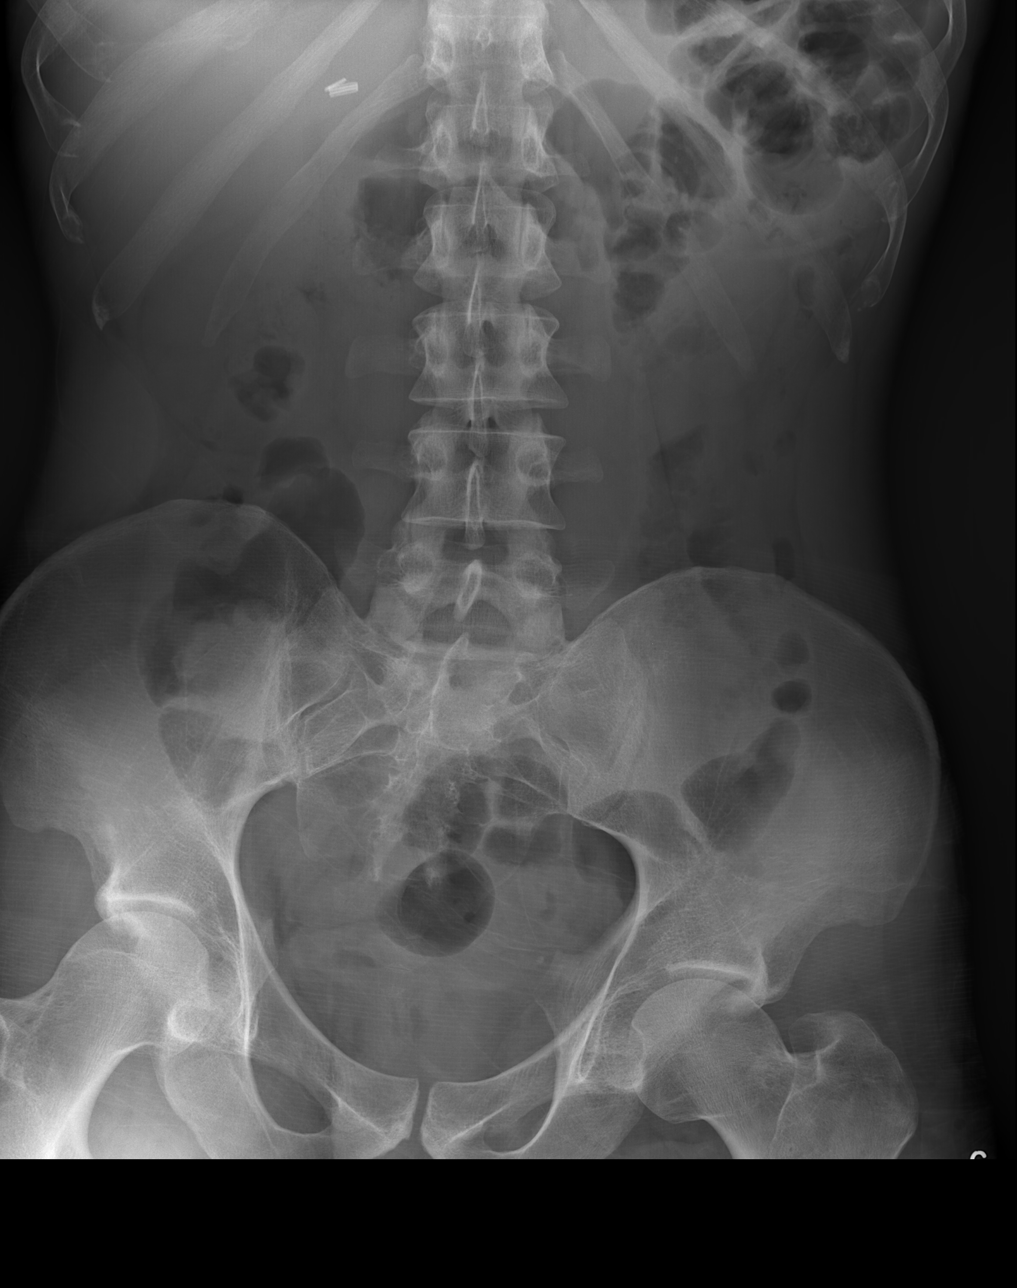

[w abdomen decub]
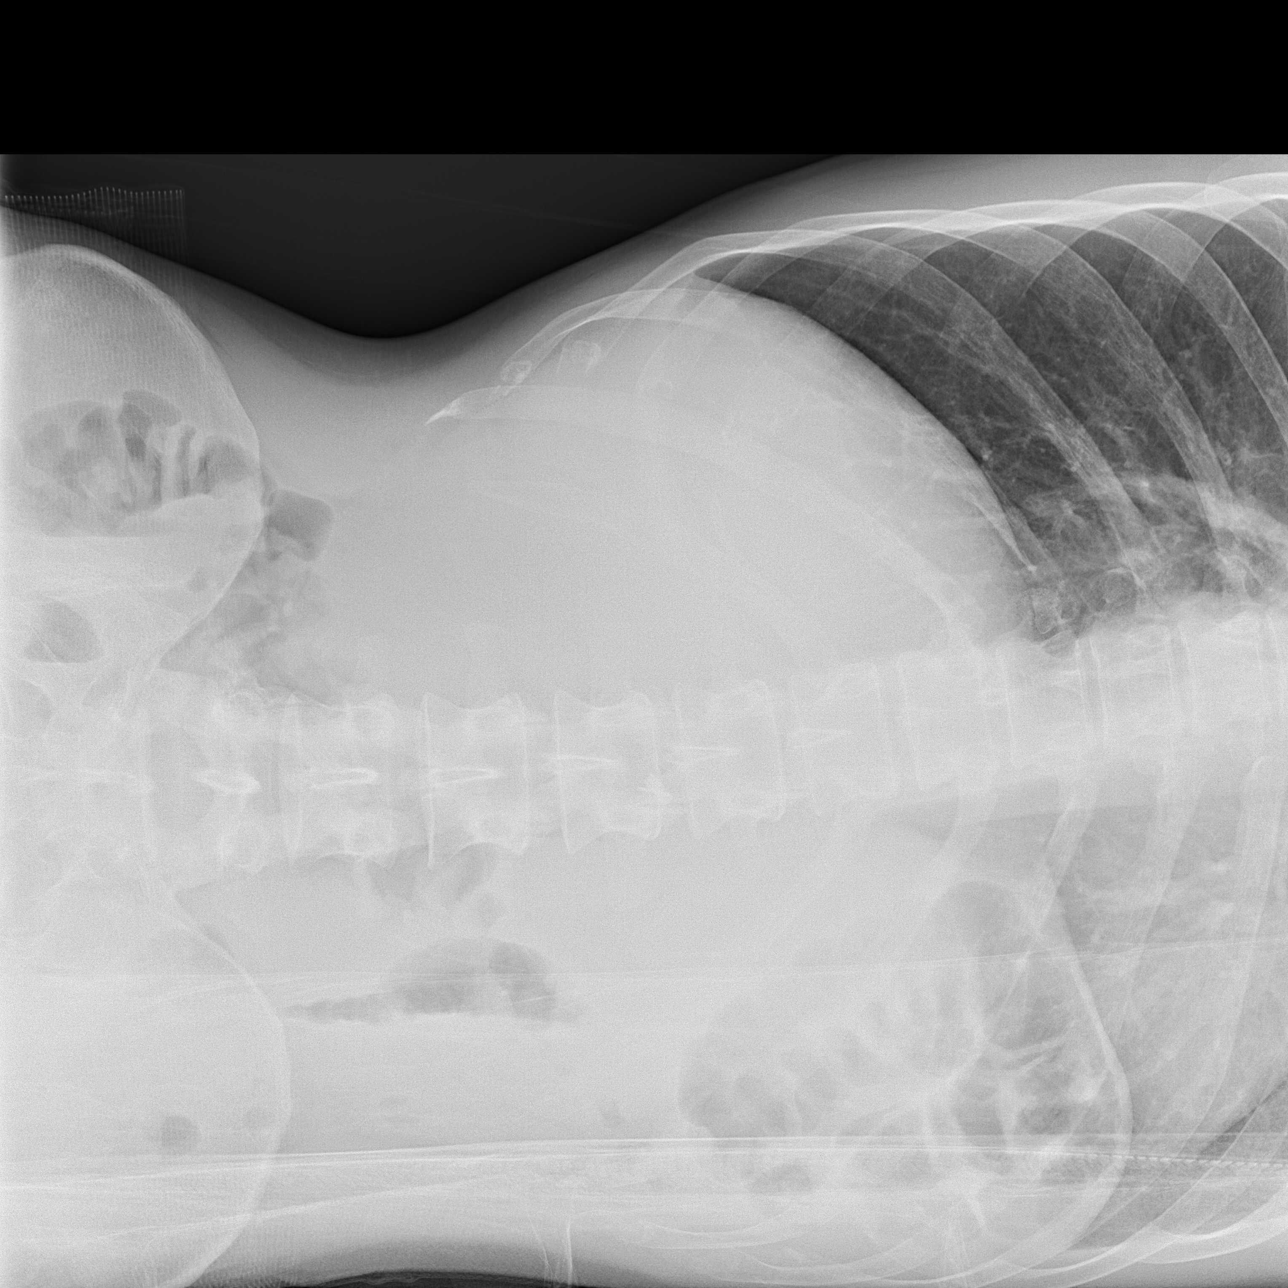

[3 of 3 positions shown; findings below may reference images not displayed]

FINDINGS: There is no evidence of dilated bowel loops or free intraperitoneal
air. Surgical clips in the right upper quadrant suggest prior
cholecystectomy. No radiopaque calculi or other significant
radiographic abnormality is seen. Heart size and mediastinal
contours are within normal limits. Both lungs are clear.
IMPRESSION: Negative abdominal radiographs.  No acute cardiopulmonary disease.

No evidence of free air.

## 2016-12-22 IMAGING — DX DG CHEST 2V
2 series · 2 of 2 positions shown · non-contrast
Comparison: 11/26/2015

CLINICAL DATA: Vomiting since [REDACTED], mid chest pain, cervical pain

EXAM:
CHEST  2 VIEW

[chest pa]
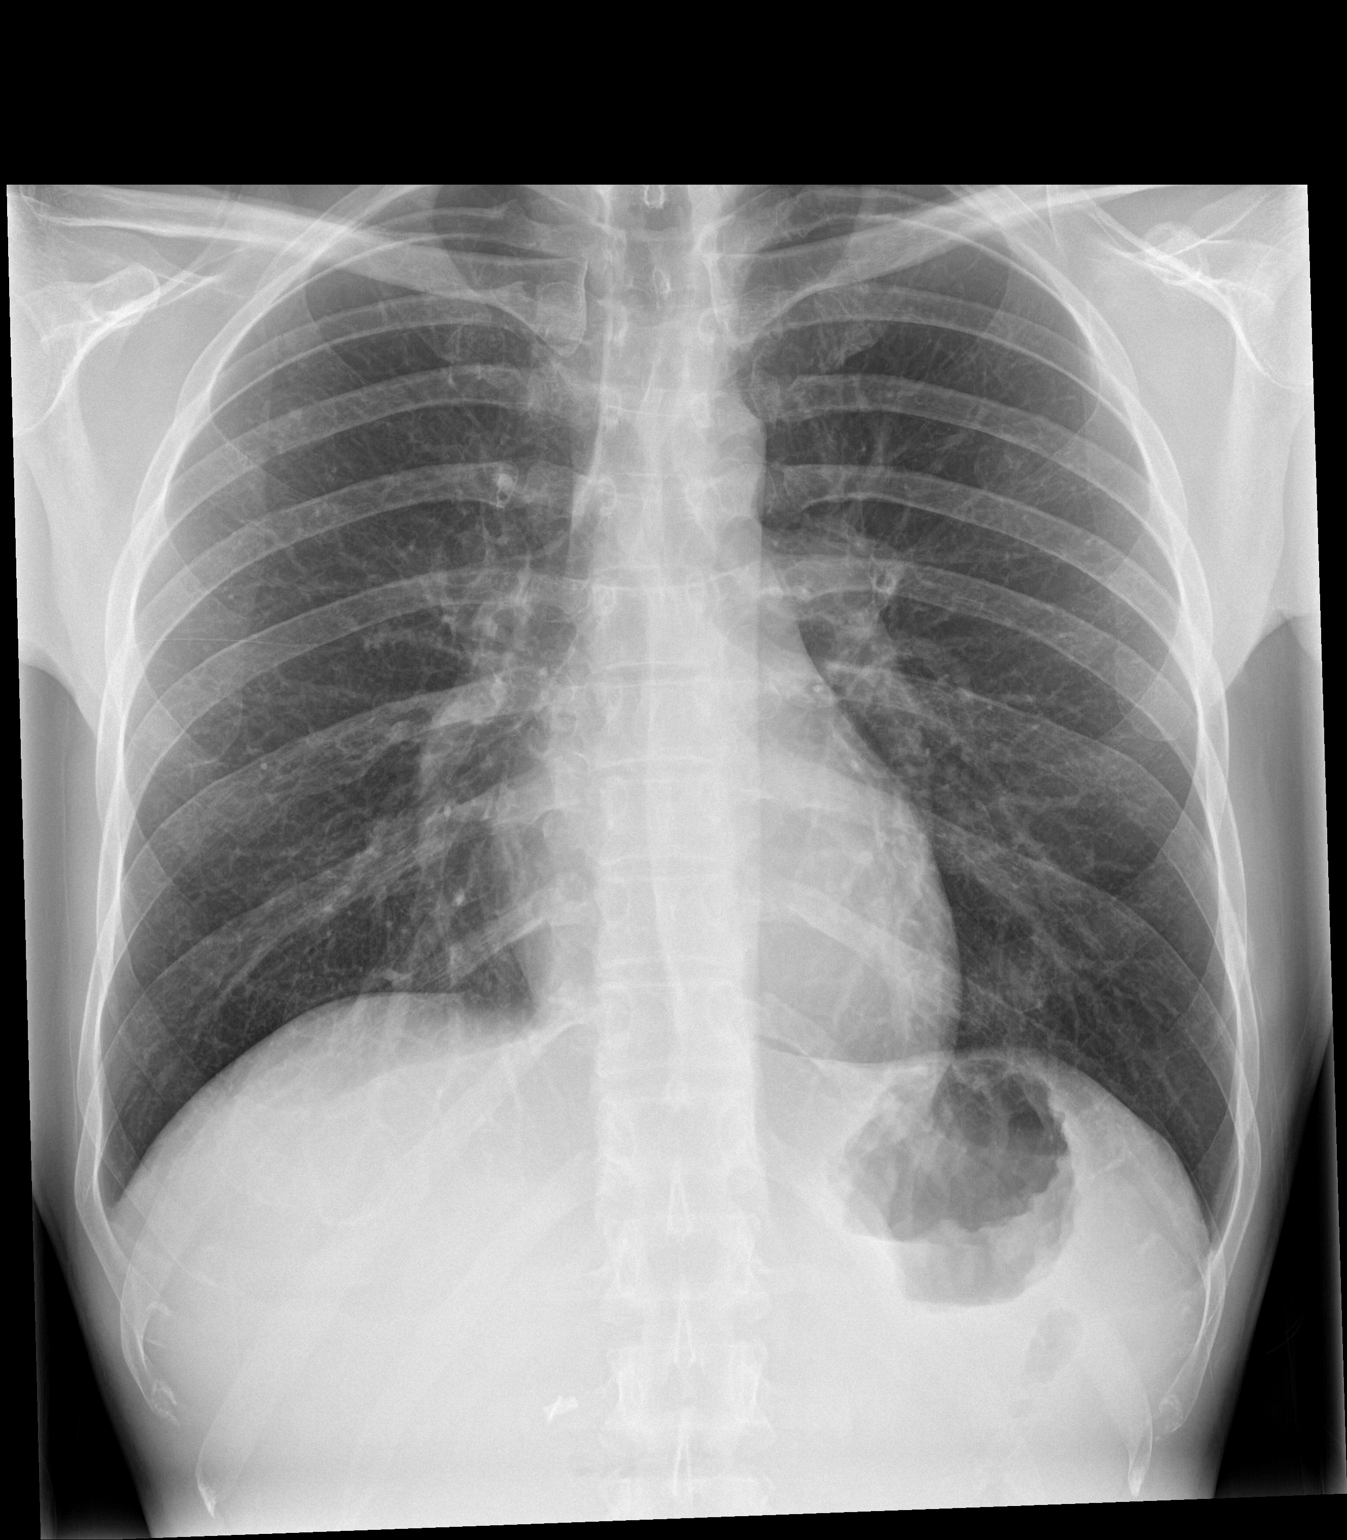

[chest lat]
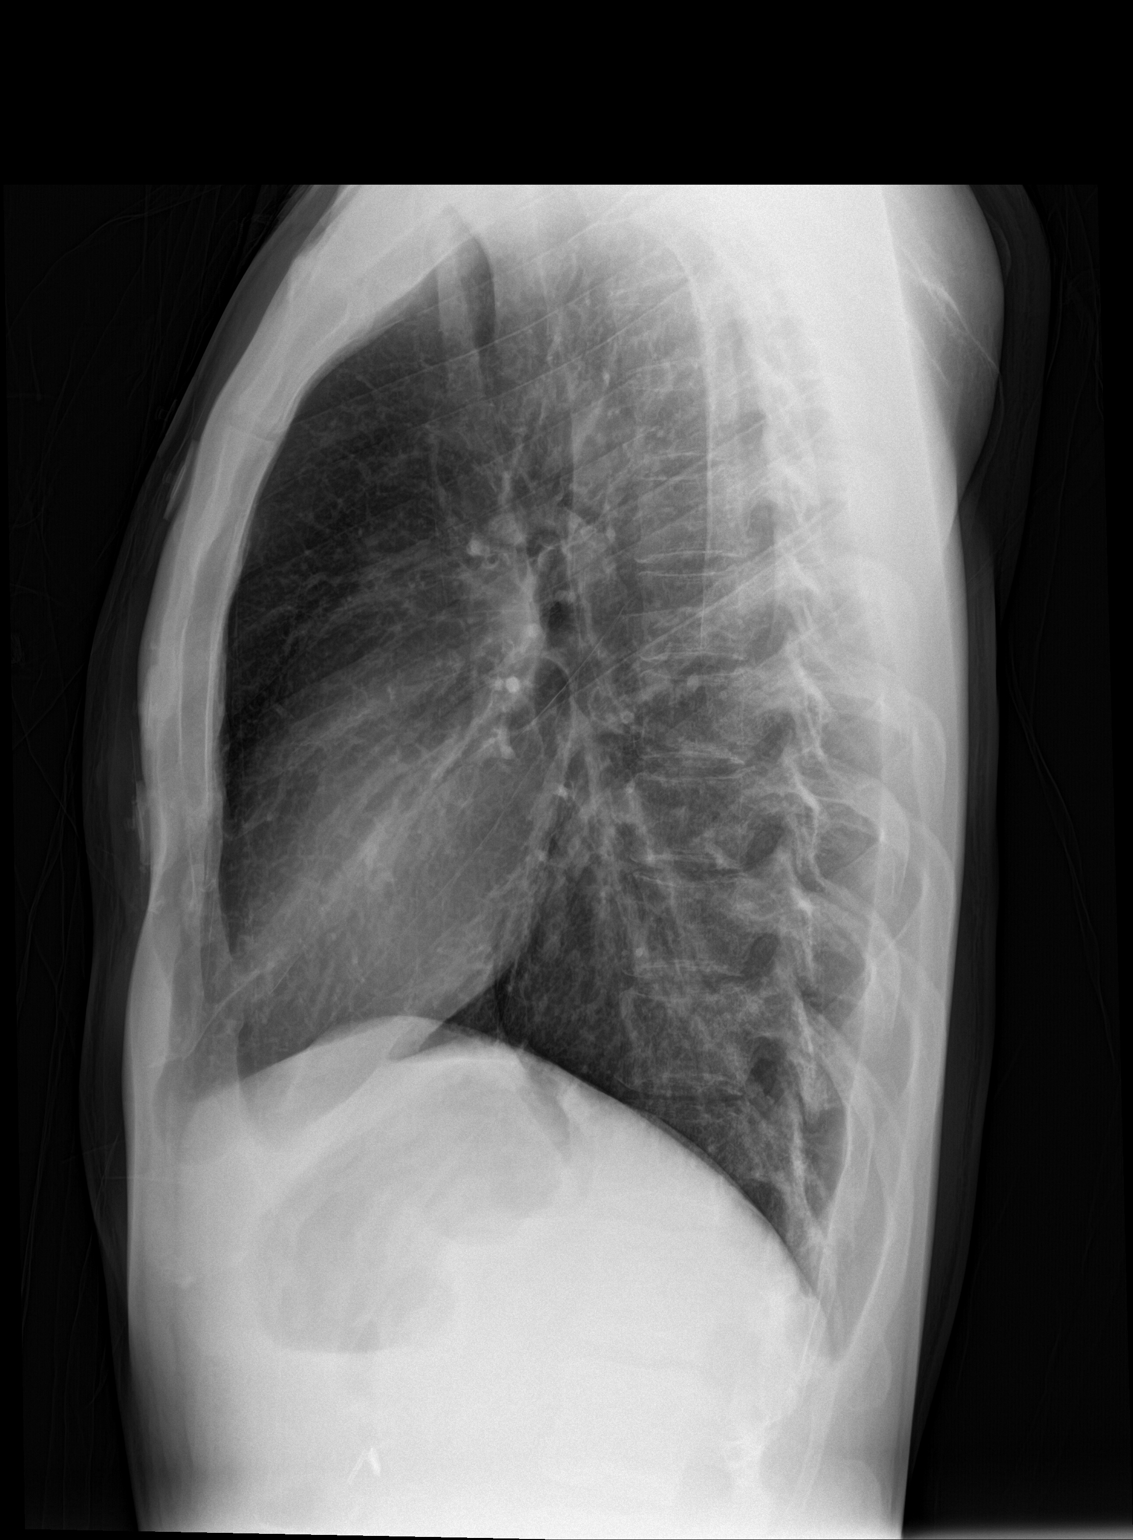

[2 of 2 positions shown; findings below may reference images not displayed]

FINDINGS: Normal heart size, mediastinal contours, and pulmonary vascularity.

Mild chronic bronchitic changes.

Lungs otherwise clear.

No pleural effusion or pneumothorax.

No acute bony abnormalities.
IMPRESSION: Chronic bronchitic changes without infiltrate.

## 2018-12-08 ENCOUNTER — Encounter: Payer: Self-pay | Admitting: Neurology

## 2018-12-08 ENCOUNTER — Ambulatory Visit: Payer: Medicaid (Managed Care) | Attending: Neurology | Admitting: Neurology

## 2018-12-08 VITALS — BP 114/75 | HR 116 | Temp 97.2°F | Resp 16 | Ht 67.0 in | Wt 123.2 lb

## 2018-12-08 DIAGNOSIS — R202 Paresthesia of skin: Secondary | ICD-10-CM | POA: Insufficient documentation

## 2018-12-08 NOTE — Patient Instructions (Signed)
1. Complete your labs  2. MRI of C spine   3. Avoid neck injury  4. Visit medicine doctor  5. Follow up in 2-3 months (video visit)

## 2018-12-08 NOTE — Progress Notes (Signed)
[facility]   NEUROMUSCULAR DIVISION: NEW PATIENT      Chief Complaint       Chief Complaint: Patient presents today with neck pain for more than 10 years.    History of Present Illness       History was obtained from the patient.   This is a 89yr R handed male with a complaint of numbness in right UE since 2016.   It is starting slowly from the lower neck in the right side, shooting down to right posterior upper arm, posterior forearm, right little and ring fingers. It is intermittent, last a few minutes, like electric shock. However, he has pain shooting from neck down to right arm all the time. His neck pain started at age of 33 years old without any injury or triggering factors. He woke up and noticed difficulty to difficulty. Neck pain is sharp, accompanying pins and needles, 6/10, constant. He has limited range of motion of neck He is prescribed Norco now for pain control. He tried gabapentin, lyrica, cymbalta that do not help his pain or numbness.   Sometimes he has intermittent numbness starting from neck to the left posterior arm. It is mild.  He felt weaker for right hand. He has difficulty to lift heavy objects and thought his writing is not as smooth as before.   He tried PT before and it only helped for a few hours.   He also tried steroids injection in his neck that did not help him at all.   The symptom has been worse since then.   Symptoms are asymmetric     Other pertinent features include:   Bladder/Bowel symptoms: He has chronic diarrhea due to diverticulitis. No urinary or bowel incontinence.  Urinary frequency since Feb 2020  He works as a Conservation officer, naturecashier.   Lhermitte's sign (+) for a few months  He has lost weight for 15 lb in 2 months and did not feel his appetite is good.    PAST MEDICAL HISTORY:   Past Medical History:   Diagnosis Date    Diverticulitis        PAST SURGICAL HISTORY:   Past Surgical History:   Procedure Laterality Date    EXPLORATORY LAPAROTOMY  2015     PR REMOVAL GALLBLADDER  2011    TONSILLECTOMY  1991       MEDICATIONS: No current outpatient medications on file.     ALLERGIES AND ADVERSE REACTIONS:   Allergies   Allergen Reactions    Regal-Am [Quinine Sulfate] Other-Reaction in Comments     shake    Toradol [Ketorolac Tromethamine] Other-Reaction in Comments     shake    Tramadol Other-Reaction in Comments     shake    Zoloft [Sertraline Hcl] Palpitations    .    SOCIAL HISTORY:   Social History     Socioeconomic History    Marital status: Not on file     Spouse name: Not on file    Number of children: Not on file    Years of education: Not on file    Highest education level: Not on file   Occupational History    Not on file   Social Needs    Financial resource strain: Not on file    Food insecurity     Worry: Not on file     Inability: Not on file    Transportation needs     Medical: Not on file  Non-medical: Not on file   Tobacco Use    Smoking status: Never Smoker    Smokeless tobacco: Never Used   Substance and Sexual Activity    Alcohol use: Not on file    Drug use: Not on file    Sexual activity: Not on file   Lifestyle    Physical activity     Days per week: Not on file     Minutes per session: Not on file    Stress: Not on file   Relationships    Social connections     Talks on phone: Not on file     Gets together: Not on file     Attends religious service: Not on file     Active member of club or organization: Not on file     Attends meetings of clubs or organizations: Not on file     Relationship status: Not on file    Intimate partner violence     Fear of current or ex partner: Not on file     Emotionally abused: Not on file     Physically abused: Not on file     Forced sexual activity: Not on file   Other Topics Concern    Not on file   Social History Narrative    Not on file        FAMILY HISTORY:   Family History   Problem Relation Name Age of Onset    Other (GI abnormality) Mother          Review of Systems        ROS were performed that were negative unless bolded  Constitutional: fever, chills, night sweats, weight loss  Head: headache, trauma  Eyes: blurring vision, eye pain, eye discharge  ENT: hearing loss, nasal congestion, sore throat, ear pain  Cardiovascular: chest pain, irregular heart beat, shortness of breath, peripheral edema  Respiratory: Cough, wheezing, shortness of breath  Gastrointestinal: Abdominal pain, heartburn, nausea, vomiting, diarrhea, constipation, bloody stool  Genitourinary: frequency, urgency, pain in urination, incontinence  Musculoskeletal: joint pain, joint swelling, restricted motion, muscle ache  Skin/Integumentary: rashes, sores, blisters  Neurological: as per HPI  Psychiatric: anxiety, depression, suicidal ideation, homicidal ideation  Endocrine: heat/cold intolerance, excessive thirsty  Hemo/lymph: abnormal bleeding, recurrent infection, enlarged glands    Physical Examination       Vital Signs - BP 114/75 (SITE: left arm, Orthostatic Position: sitting, Cuff Size: regular)   Pulse 116   Temp 36.2 C (97.2 F) (Tympanic)   Resp 16   Ht 1.702 m ( )   Wt 55.9 kg (123 lb 3.8 oz)   BMI 19.30 kg/m     General exam:    General - well nourished, well developed, in no apparent distress.    Head- NCAT  EENT- No ear discharge, no eye discharge, neck supple  Cardiac - RRR, no m/g/r. Palpation of distal pulses in all extremities normal  Lung- CTA  Abd- soft, no tenderness, BS+  Ext- range of motion is intact except the ring finger of right (old bone fracture), no edema   Lymph- no enlarged lymph nodes  Skin- warm, no rashes    Neurological Examination     Mental Status -   Level of arousal and orientation to time, place, and person were intact  Fluent speech, intact comprehension  Attention span and concentration were normal  Recent and remote memory intact  Fund of Knowledge was assessed and was intact    Cranial Nerves  II - XII -  II - Vision intact OU; Fundus: no papilledema  bilaterally  III, IV, VI - Extraocular movements intact, PERRL  V - Facial sensation intact bilaterally  VII - Facial movement intact bilaterally  VIII - Hearing intact bilaterally  X - Palate elevates symmetrically  XI - Chin turning & shoulder shrug intact bilaterally  XII - Tongue protrusion intact    Motor Strength - The patient's strength listed below. Pronator drift was absent    Motor Tone & Bulk - Muscle tone was assessed at the neck and appendages and was normal.  Bulk was normal and fasciculations were absent    Reflexes - The patient's reflexes were listed below  no pathological reflexes    Sensory -  Light touch nl  Temperature nl  Pinprick decreased in right lateral upper arm and forearm  Vibration nl  Proprioception nl  Romberg test negative    Coordination - The patient had normal movements in the hands and feet with no ataxia or dysmetria.  Mild action tremor in bilateral hands. FTN and HTS are intact bilaterally    Gait and Station - The patient's transfers, posture, gait, station, and turns were observed as normal    Muscle Strength is as follows:              L  R  Deltoids 5 5   Biceps 5 5   Triceps 5 5   Wrist extensors 5 5   Wrist flexors 5 5   Finger extensors 5 5- (chronic injury)   Finger flexors 5 5   Hand intrinsics 5 5   Hip flexors 5 5   Quads 5 5   Hams  5 5   Foot extensors 5 5   Gastrocs  5 5   Tibialis Anterior 5 5   Peroneii 5 5   Toe Flexors 5 5         Deep Tendon Reflexes are as follows       L    R    Biceps 2+ 2+   Triceps 2+ 2+   Brachioradialis 2+ 2+   Knee 3 3   Ankle 2 2   Plantar response Down  Down       Hoffman's sign                            -                                 equivocal     Current Medications     No outpatient encounter medications on file as of 12/08/2018.     No facility-administered encounter medications on file as of 12/08/2018.        No results found for any previous visit.         DATA REVIEWED:       Plan   Problem #1: Paresthesia  This is a 33  year-old man presenting with chronic neck pain for more than 15 years and paresthesia in RUE for 3 years. He developed neck pain as a teenager without trauma. It slowly worsened with limited range of motion. He started to have paresthesia with pain shooting from neck to right UE a few years ago and complained Lhermitt's sign. Neurological exam showed reduced pinprick in right lateral upper arm and forearm (C5/6 dermatome), symmetric but brisk DTR  and equivocal Hoffman's sign in right side. His clinical presentation suggests cervical radiculopathy vs cervical myelopathy. Given his young age and repaid loss of weight, it is important to obtain MRI of C spine with contrast    - Myelopathy labs  - MRI of C spine wwo contrast  - Avoid neck injury  - Continue current medication  - PT  - Will consider EMG/NCS test if his MRI of C spine is negative to explain his symptom  - Visit PCP for evaluation of rapid weight loss  - Follow up in 3 months    Time Spent   This note was recorded by dragon and reviewed.   Approximately 60 minutes were spent with the patient, of which more than 50% was spent in counseling and/or coordination of care. The patient understands and agrees with the plan of care as outlined.

## 2018-12-08 NOTE — Nursing Note (Signed)
Vital signs taken, allergies verified, screened for pain. Preferred pharmacy verified.  Medication refills needed no.  Yvonne Petite, MA II

## 2018-12-17 ENCOUNTER — Telehealth: Payer: Self-pay | Admitting: Neurology

## 2018-12-17 NOTE — Telephone Encounter (Signed)
Pt called back. He is calmer now.  Discussed with pt's Dr. Marcha Solders response.  Also explained that his injury yesterday is an acute injury and needs to be evaluated separtely from his initial evaluation with Dr. Jason Fila on 6/3.  Advised him to have his work or his PCP refer him to an Press photographer.  He verbalizes understanding.    Selinda Flavin [name] RN/neurology

## 2018-12-17 NOTE — Telephone Encounter (Signed)
Please let the patient complete MRI of cervical spine and visit physical therapist for his functional evaluation and treatment of neck pain. Our clinic does not have dynamometer to assess his function so we need the physical therapist evaluation for the letter.

## 2018-12-17 NOTE — Telephone Encounter (Signed)
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 Experienced numbness and tingling in both arms and hands for 1.5 hours. He further states he needs a doctor's letter stating when he can return to work, and that his PCP advised him to seek the letter from Dr Jason Fila, who is seeing him for his existing neck problems. Pt requests a call back at [phone].

## 2018-12-17 NOTE — Telephone Encounter (Signed)
Called patient to provided MD response. After discussing plan of care , MRI, PT for functional assessment. He began yelling at RN how he needed it now and is not getting answers and his PCP will not write letter. He has been seen here ONE time and this is a new injury as of 12/16/18. He would not stop raising his voice when I asked him to stop speaking to me this way , I placed him on hold for another RN and he hung up the phone.   He has no forms form HR for job abilities.   Collier Flowers, RN

## 2018-12-28 ENCOUNTER — Encounter: Payer: Self-pay | Admitting: Neurology

## 2018-12-31 ENCOUNTER — Ambulatory Visit
Admission: RE | Admit: 2018-12-31 | Discharge: 2018-12-31 | Disposition: A | Discharge status: Home / Self Care | Payer: Medicaid (Managed Care) | Source: Ambulatory Visit | Attending: NEURORADIOLOGY | Admitting: NEURORADIOLOGY

## 2018-12-31 DIAGNOSIS — M4802 Spinal stenosis, cervical region: Secondary | ICD-10-CM | POA: Insufficient documentation

## 2018-12-31 DIAGNOSIS — M47892 Other spondylosis, cervical region: Secondary | ICD-10-CM | POA: Insufficient documentation

## 2018-12-31 DIAGNOSIS — M5020 Other cervical disc displacement, unspecified cervical region: Secondary | ICD-10-CM | POA: Insufficient documentation

## 2018-12-31 DIAGNOSIS — R202 Paresthesia of skin: Secondary | ICD-10-CM

## 2018-12-31 MED ORDER — GADOTERIDOL 279.3 MG/ML INTRAVENOUS SYRINGE - 10 ML SYRINGE
INJECTION | INTRAVENOUS | Status: AC
Start: 2018-12-31 — End: 2018-12-31
  Administered 2018-12-31: 09:00:00 via INTRAVENOUS

## 2019-02-15 ENCOUNTER — Encounter: Payer: Self-pay | Admitting: Neurology

## 2019-02-21 ENCOUNTER — Ambulatory Visit: Payer: Medicaid (Managed Care) | Admitting: Neurology

## 2019-02-28 ENCOUNTER — Ambulatory Visit: Payer: Medicaid (Managed Care) | Admitting: Neurology

## 2019-02-28 DIAGNOSIS — G8929 Other chronic pain: Secondary | ICD-10-CM

## 2019-02-28 DIAGNOSIS — M4802 Spinal stenosis, cervical region: Secondary | ICD-10-CM

## 2019-02-28 DIAGNOSIS — M5412 Radiculopathy, cervical region: Secondary | ICD-10-CM

## 2019-02-28 DIAGNOSIS — R202 Paresthesia of skin: Secondary | ICD-10-CM

## 2019-02-28 NOTE — Patient Instructions (Signed)
1. EMG  2. Neurosurgery visit  3. Avoid neck injury  4. Complete lab tests  5. Follow up in 3 months (video or phone visit)

## 2019-02-28 NOTE — Progress Notes (Signed)
I performed a virtual check with the patient via Telephone for follow up visit. I obtained verbal consent from the patient to perform this clinical encounter.   [facility]   NEUROMUSCULAR DIVISION      Chief Complaint       Chief Complaint: Patient presents today with neck pain for more than 10 years.    History of Present Illness       Since last visit, Samuel NeedleMichael has worsening neck pain and headache close to neck area. Neck pain is still radiating down to right upper extremity.   He has stable numbness in right arm and hand. Numbness is most significant in right little finger and ring finger. He has not completed lab tests.   He agreed to visit neurosurgeon for his cervical stenosis found in MRI. He tried PT and steroids injection before and did not think it helped his right arm numbness.     Brief Past Neurological History  This is a [age]yr R handed male with a complaint of numbness in right UE since 2016.   It is starting slowly from the lower neck in the right side, shooting down to right posterior upper arm, posterior forearm, right little and ring fingers. It is intermittent, last a few minutes, like electric shock. However, he has pain shooting from neck down to right arm all the time. His neck pain started at age of 33 years old without any injury or triggering factors. He woke up and noticed difficulty to difficulty. Neck pain is sharp, accompanying pins and needles, 6/10, constant. He has limited range of motion of neck He is prescribed Norco now for pain control. He tried gabapentin, lyrica, cymbalta that do not help his pain or numbness.   Sometimes he has intermittent numbness starting from neck to the left posterior arm. It is mild.  He felt weaker for right hand. He has difficulty to lift heavy objects and thought his writing is not as smooth as before.   He tried PT before and it only helped for a few hours.   He also tried steroids injection in his neck that did not help  him at all.   The symptom has been worse since then.   Symptoms are asymmetric     Other pertinent features include:   Bladder/Bowel symptoms: He has chronic diarrhea due to diverticulitis. No urinary or bowel incontinence.  Urinary frequency since Feb 2020  He works as a Conservation officer, naturecashier.   Lhermitte's sign (+) for a few months  He has lost weight for 15 lb in 2 months and did not feel his appetite is good.    PAST MEDICAL HISTORY:   Past Medical History:   Diagnosis Date    Diverticulitis        PAST SURGICAL HISTORY:   Past Surgical History:   Procedure Laterality Date    EXPLORATORY LAPAROTOMY  2015    PR REMOVAL GALLBLADDER  2011    TONSILLECTOMY  1991       MEDICATIONS:   Current Outpatient Medications:     Hydrocodone 10 mg/Acetaminophen 325 mg (NORCO) Tablet, Take 1 tablet by mouth every day., Disp: , Rfl:     Promethazine (PHENERGAN) 25 mg Tablet, Take 25 mg by mouth once daily if needed., Disp: , Rfl:      ALLERGIES AND ADVERSE REACTIONS:   Allergies   Allergen Reactions    Regal-Am [Quinine Sulfate] Other-Reaction in Comments     shake  Toradol [Ketorolac Tromethamine] Other-Reaction in Comments     shake    Tramadol Other-Reaction in Comments     shake    Zoloft [Sertraline Hcl] Palpitations    .    SOCIAL HISTORY:   Social History     Socioeconomic History    Marital status: Not on file     Spouse name: Not on file    Number of children: Not on file    Years of education: Not on file    Highest education level: Not on file   Occupational History    Not on file   Social Needs    Financial resource strain: Not on file    Food insecurity     Worry: Not on file     Inability: Not on file    Transportation needs     Medical: Not on file     Non-medical: Not on file   Tobacco Use    Smoking status: Never Smoker    Smokeless tobacco: Never Used   Substance and Sexual Activity    Alcohol use: Not Currently     Frequency: Monthly or less     Comment: stopped after gallbladder surgery    Drug use:  Yes     Frequency: 3.0 times per week     Types: Marijuana    Sexual activity: Not on file   Lifestyle    Physical activity     Days per week: Not on file     Minutes per session: Not on file    Stress: Not on file   Relationships    Social connections     Talks on phone: Not on file     Gets together: Not on file     Attends religious service: Not on file     Active member of club or organization: Not on file     Attends meetings of clubs or organizations: Not on file     Relationship status: Not on file    Intimate partner violence     Fear of current or ex partner: Not on file     Emotionally abused: Not on file     Physically abused: Not on file     Forced sexual activity: Not on file   Other Topics Concern    Not on file   Social History Narrative    Not on file        FAMILY HISTORY:   Family History   Problem Relation Name Age of Onset    Other (GI abnormality) Mother          Review of Systems       ROS were performed that were negative unless bolded  Constitutional: fever, chills, night sweats, weight loss  Head: headache, trauma  Eyes: blurring vision, eye pain, eye discharge  ENT: hearing loss, nasal congestion, sore throat, ear pain  Cardiovascular: chest pain, irregular heart beat, shortness of breath, peripheral edema  Respiratory: Cough, wheezing, shortness of breath  Gastrointestinal: Abdominal pain, heartburn, nausea, vomiting, diarrhea, constipation, bloody stool  Genitourinary: frequency, urgency, pain in urination, incontinence  Musculoskeletal: joint pain, joint swelling, restricted motion, muscle ache  Skin/Integumentary: rashes, sores, blisters  Neurological: as per HPI  Psychiatric: anxiety, depression, suicidal ideation, homicidal ideation  Endocrine: heat/cold intolerance, excessive thirsty  Hemo/lymph: abnormal bleeding, recurrent infection, enlarged glands     Current Medications     Outpatient Encounter Medications as of 02/28/2019   Medication Sig Dispense Refill  Hydrocodone  10 mg/Acetaminophen 325 mg (NORCO) Tablet Take 1 tablet by mouth every day.      Promethazine (PHENERGAN) 25 mg Tablet Take 25 mg by mouth once daily if needed.       No facility-administered encounter medications on file as of 02/28/2019.      No results found for any previous visit.      DATA REVIEWED:   MRI of C spine (12/2018)  1. 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  2. Broad-based disc bulges at C5-6 and C6-7 which harbor annular tears.      Plan   Problem #1: Paresthesia  This is a 33 year-old man presenting with chronic neck pain for more than 15 years and paresthesia in RUE for 3 years. He developed neck pain as a teenager without trauma. It slowly worsened with limited range of motion. He started to have paresthesia with pain shooting from neck to right UE a few years ago and complained Lhermitt's sign. Prior neurological exam showed reduced pinprick in right lateral upper arm and forearm (C5/6 dermatome), symmetric but brisk DTR and equivocal Hoffman's sign in right side. MRI of C spine showed cervical spondylosis at C5/6 and C6/7 without abnormal cord signal. However, C5/6 and C6/7 radiculopathy should not affect right little finger. Will obtain EMG to rule out possible ulnar neuropathy    - Labs pending  - Avoid neck injury  - EMG/NCS study in right UE to rule out possible CTS and ulnar neuropathy   - Neurosurgery referral for cervical stenosis  - Follow up in 3 months (video or phone visit)    Time Spent   This note was recorded by dragon and reviewed.   Approximately 30 minutes were spent with the patient, of which more than 50% was spent in counseling and/or coordination of care. The patient understands and agrees with the plan of care as outlined.

## 2019-03-03 ENCOUNTER — Other Ambulatory Visit: Payer: Self-pay | Admitting: Physician Assistant

## 2019-03-03 DIAGNOSIS — M5412 Radiculopathy, cervical region: Secondary | ICD-10-CM

## 2019-04-14 ENCOUNTER — Ambulatory Visit (HOSPITAL_BASED_OUTPATIENT_CLINIC_OR_DEPARTMENT_OTHER): Payer: Medicaid (Managed Care) | Admitting: SURGERY/NEUROLOGIC

## 2019-04-14 ENCOUNTER — Encounter: Payer: Self-pay | Admitting: SURGERY/NEUROLOGIC

## 2019-04-14 ENCOUNTER — Ambulatory Visit
Admission: RE | Admit: 2019-04-14 | Discharge: 2019-04-14 | Disposition: A | Discharge status: Home / Self Care | Payer: Medicaid (Managed Care) | Source: Ambulatory Visit | Attending: SURGERY/NEUROLOGIC | Admitting: SURGERY/NEUROLOGIC

## 2019-04-14 VITALS — BP 121/77 | HR 92 | Temp 96.9°F | Resp 16 | Ht 67.0 in | Wt 149.5 lb

## 2019-04-14 DIAGNOSIS — M50222 Other cervical disc displacement at C5-C6 level: Secondary | ICD-10-CM

## 2019-04-14 DIAGNOSIS — M4602 Spinal enthesopathy, cervical region: Secondary | ICD-10-CM

## 2019-04-14 DIAGNOSIS — M50223 Other cervical disc displacement at C6-C7 level: Secondary | ICD-10-CM

## 2019-04-14 DIAGNOSIS — M4802 Spinal stenosis, cervical region: Secondary | ICD-10-CM

## 2019-04-14 DIAGNOSIS — M4722 Other spondylosis with radiculopathy, cervical region: Secondary | ICD-10-CM

## 2019-04-14 DIAGNOSIS — M5412 Radiculopathy, cervical region: Secondary | ICD-10-CM | POA: Insufficient documentation

## 2019-04-14 DIAGNOSIS — M542 Cervicalgia: Secondary | ICD-10-CM

## 2019-04-14 NOTE — Nursing Note (Signed)
Identified patient using name and date of birth.     Patient WAS wearing a mask.  Droplet precautions were followed when caring for the patient.   PPE used by provider during encounter: Surgical mask and Face Shield/Goggles.    Vital signs were taken.    Screened for pain.    Allergies verified.  Pharmacy was updated.  Sharlena Kristensen (Ali) Burnis Halling, MA II

## 2019-04-14 NOTE — Patient Instructions (Addendum)
Plan:  -Referred to pain medicine specialist for neck pain injections  -Pursue EMG/NCS, recommends bilateral upper extremities  -Follow-up after completion of above. Patient will call us for appointment        [facility]  [address]  [place] [address]    Dept: [phone]  Fax : [phone]
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
## 2019-05-10 ENCOUNTER — Telehealth: Payer: Self-pay | Admitting: SURGERY/NEUROLOGIC

## 2019-05-10 NOTE — Telephone Encounter (Signed)
Hello Dr. Harvie Bridge    For this referral to pain clinic for potential Spinal Diagnostics Track trigger point injections without imaging. Request for authorization for trigger point injection cpt codes [phone] has been DENIED by patient insurance. [employer] has denied with reference # [id number].    Rational for denial:  Trigger point injections are a shot in the muscle knots to get rid of pain and knots. The request is denied. This is because we do not have all the information to decide the medical need for trigger point injections. Patient must have tried and failed physical therapy or home exercise program for at least three weeks. We do not know if trigger points are located in a few spots. We do not know if there are muscle tenderness all over the body.     To request a peer to peer call [phone]. Use reference# [id number].    I have spoke to patient he is aware of denial. He has also provided physical therapy notes they are scanned in under media.    Denial letter is also scanned in referral    Let me know once approved or if you need any further assistance.    Thank you  [facility] Patient Referrals  [facility]  Patient XMIW:[phone]  Back NOIB:[phone]  [phone]

## 2019-05-10 NOTE — Telephone Encounter (Signed)
Called and left VM to schedule P2P 11/6 8-10am or 1-3pm with myself to complete.    Referral team please fax the PT notes to physician reviewer as well       Leron Croak, NP  Edwina Barth, [facility]; [facility]    Cc: Carla Drape, MD; Harvie Bridge Terrence Dupont, MD; Suzan Garibaldi, NP    Caller: Unspecified (Today, 10:08 AM)              Patient completed PT from 05/17/2018 to 09/15/2018 for a total of 24/24 sessions at [facility] (note in system). Please help schedule for peer to peer. Otherwise will order another PT if denied.     [place]

## 2019-05-12 ENCOUNTER — Ambulatory Visit: Payer: Medicaid (Managed Care) | Attending: Neurology

## 2019-05-12 DIAGNOSIS — R29898 Other symptoms and signs involving the musculoskeletal system: Secondary | ICD-10-CM | POA: Insufficient documentation

## 2019-05-12 DIAGNOSIS — G5622 Lesion of ulnar nerve, left upper limb: Secondary | ICD-10-CM | POA: Insufficient documentation

## 2019-05-12 DIAGNOSIS — R94131 Abnormal electromyogram [EMG]: Secondary | ICD-10-CM | POA: Insufficient documentation

## 2019-05-12 DIAGNOSIS — M5412 Radiculopathy, cervical region: Secondary | ICD-10-CM | POA: Insufficient documentation

## 2019-05-12 DIAGNOSIS — R202 Paresthesia of skin: Secondary | ICD-10-CM | POA: Insufficient documentation

## 2019-05-12 DIAGNOSIS — R29818 Other symptoms and signs involving the nervous system: Secondary | ICD-10-CM | POA: Insufficient documentation

## 2019-05-12 DIAGNOSIS — M542 Cervicalgia: Secondary | ICD-10-CM | POA: Insufficient documentation

## 2019-05-12 NOTE — Procedures (Signed)
[facility], Samuel Cobb  Department of Neurology  EMG Laboratory        Full Name: Samuel Cobb Gender: Male  Patient ID: [id number] Date of Birth: 1986-06-20      Visit Date: 05/12/2019 12:42  Age: 33 Years 5 Months Old    Patient History:33 year-old man presenting with chronic neck pain for more than 15 years and paresthesia in RUE for 3 years. He developed neck pain as a teenager without trauma. It slowly worsened with limited range of motion. He started to have paresthesia with pain shooting from neck to right UE a few years ago and complained Lhermitt's sign. MRI of C spine did not report cord compression. He complained numbness in left little finger and thought his left hand was weaker. He had left hand injury affecting the ring finger before. On focused neurological exam: CN II-XII intact. Normal muscle bulk and tone. Muscle power is 5/5. Pinprick sensation slightly decreased in the left little finger. Normal temperature and vibration. DTRs: 2+ throughout. Tinel's sign (+) in left elbow. Query: Cervical radiculopathy vs CTS vs ulnar neuropathy ?          ELECTRODIAGNOSTIC FINDINGS:     1. Normal sensory nerve conduction studies in bilateral median, ulnar and right radial nerves  2. Normal motor nerve conduction in bilateral median and ulnar nerves. The difference of onset latencies is borderline prolonged in left ulnar inching study of the segment between elbow and 2.5 cm distal to elbow  3. Needle EMG was performed in selective muscles of bilateral upper extremities that were within normal range except slightly decreased recruitment of left FCU muscle    CLINICAL IMPRESSION:  Borderline prolonged difference of onset latencies in left ulnar nerve between elbow and 2/5 com distal to the elbow. 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     Comments:  Occupational therapy was ordered. The patient was instructed to  avoid elbow compression and complete occupational therapy.                    MNC      Nerve / Sites Muscle Latency Amp Duration Segments Distance Lat Diff Velocity Temp.     ms mV ms  mm ms m/s C   R Median - APB      Wrist APB 3.13 11.8 5.36 Wrist - APB 80   33.6      Elbow APB 7.19 11.7 5.57 Elbow - Wrist 230 4.06 57 33.6   R Ulnar - ADM      Wrist ADM 2.40 22.4 5.99 Wrist - ADM 80   32.6      B.Elbow ADM 5.31 21.1 6.20 B.Elbow - Wrist 175 2.92 60 32.5      A.Elbow ADM 6.88 20.7 6.25 A.Elbow - B.Elbow 100 1.56 64 32.6        A.Elbow - Wrist  4.48  32.6   L Ulnar - ADM      Wrist ADM 2.55 17.9 5.63 Wrist - ADM 80   33.4      B.Elbow ADM 5.57 16.4 6.09 B.Elbow - Wrist 180 3.02 60 33.4      A.Elbow ADM 7.08 16.0 6.04 A.Elbow - B.Elbow 100 1.51 66 33.3        A.Elbow - Wrist  4.53  33.3   L Ulnar - ADM Inching      wrist ADM 2.76 16.2 4.95 wrist -  ADM    31.3      - 5 cm ADM 5.31 15.2 5.31 - 5 cm - wrist  2.55  31.3      - 2.5 cm ADM 5.68 15.5 5.36 - 2.5 cm - - 5 cm 25 0.36  31.3      elbow ADM 6.30 15.8 5.42 elbow - - 2.5 cm 25 0.63  31.3      2.5 cm ADM 6.51 15.0 5.57 2.5 cm - elbow 25 0.21  31.5      5 cm ADM 6.88 14.7 5.63 5 cm - 2.5 cm 25 0.36  31.5               MNC      Nerve / Sites Muscle Latency Amp Area Duration Segments Distance Velocity Temp.     ms mV % ms  mm m/s C   L Median - APB      Wrist APB 3.02 16.4 100 5.10 Wrist - APB 80  31.6      Elbow APB 6.82 16.5 98.5 5.16 Elbow - Wrist 215 57 31.7         Wrist - Elbow   31.7         SNC      Nerve / Sites Rec. Site Onset Lat Peak Lat NP Amp Segments Distance Velocity Temp.     ms ms V  mm m/s C   R Median - Orthodromic (Dig II, Mid palm)      Dig II Wrist 2.34 2.97 14.4 Dig II - Wrist 130 55 33.1   L Median - Orthodromic (Dig II, Mid palm)      Dig II Wrist 2.45 2.97 13.5 Dig II - Wrist 130 53 31.5   R Ulnar - Orthodromic, (Dig V, Mid palm)      Dig V Wrist 1.98 2.50 10.5 Dig V - Wrist 105 53 33   L Ulnar - Orthodromic, (Dig V, Mid palm)      Dig V  Wrist 2.14 2.71 13.4 Dig V - Wrist 125 59 32.7               SNC      Nerve / Sites Rec. Site Onset Lat Peak Lat NP Amp PP Amp Segments Distance Velocity Temp.     ms ms V V  mm m/s C   R Radial - Orthodromic (Dig I)      Dig I Forearm 2.14 2.81 6.3 9.3 Dig I - Forearm 125 59 33         EMG         EMG Summary Table     Spontaneous MUAP Recruitment   Muscle Nerve Roots Insertional Fib PSW Fasc H.F. Amp Dur. Poly Pattern   L. Deltoid Axillary C5-C6 N None None None None N N N N   L. Triceps brachii Radial C6-C8 N None None None None N N N N   L. Pronator teres Median C6-C7 N None None None None N N N N   L. Flexor carpi ulnaris Ulnar C7-T1 N None None None None N N N Decr   L. First dorsal interosseous Ulnar C8-T1 N None None None None N N N N   R. Deltoid Axillary C5-C6 N None None None None N N N N   R. Triceps brachii Radial C6-C8 N None None None None N N N N   R. Pronator teres Median C6-C7 N None None None None N  N N N   R. First dorsal interosseous Ulnar C8-T1 N None None None None N N N N

## 2019-05-17 NOTE — Telephone Encounter (Signed)
MD called for peer to peer would not leave information, stating that [facility] called her.

## 2019-05-17 NOTE — Telephone Encounter (Signed)
Peer to peer is scheduled for Friday 11/13 at 1:00pm     [place] [phone]

## 2019-05-20 ENCOUNTER — Telehealth: Payer: Self-pay | Admitting: Nurse Practitioner

## 2019-05-20 NOTE — Telephone Encounter (Signed)
Incoming call for P2P for TPI by Dr. Candiss Norse    Acknowledges patient did do >6 weeks of PT (uploaded under media)    States that guidelines dictate that trigger points need to be identified and documented prior to approving procedure.    Dr. Dorene Grebe, please review if appropriate to schedule patient for a consult with pain management NP/MD prior to resubmitting auth for a procedure.

## 2019-05-20 NOTE — Telephone Encounter (Signed)
CONSULT with ANY PAIN MANAGEMENT PHYSICIAN: in-person only     Update referral notes    ========================================================================================================================================    Insurance company denied trigger point injection for the following reasons below. Greater specificity on muscles to be targeted and documentation of physical therapy outcome would be needed if trigger point injections are indicated.

## 2019-05-20 NOTE — Telephone Encounter (Addendum)
Called and left VM on line below as I was supposed to receive a call for P2P at 1pm today.        To request a peer to peer call [phone]. Use reference# [id number].

## 2019-05-23 NOTE — Telephone Encounter (Signed)
Patient scheduled for in person consult with PK 11/17.    Thank you,  Myrtis Ser   [facility]

## 2019-05-23 NOTE — Progress Notes (Addendum)
Patient WAS wearing a surgical mask  Droplet precautions were followed when caring for the patient.   PPE used by provider during encounter: Surgical mask and Face Shield/Goggles  ========================================================================================================================================     Samuel Cobb [facility]  Department of Anesthesiology and Pain Medicine  [address], [address]  [place], [place] [address]  Phone: [phone]    Fax: [phone]    Date: 05/24/2019    Re: Samuel Cobb  MR#: [id number]  Date of Birth: 10-Apr-1986  Age: 33yr    Requesting physician: Samuel Kalata, MD    Below is the summary of the visit with our mutual patient, Samuel Cobb. Thank you for your cooperation in his care.           Assessment and Differential Diagnosis:    ICD-10-CM    1. Myofascial pain  M79.18    2. Chronic, continuous use of opioids  F11[facility] Services Referral - Chronic Pain and Opioid Stewardship        Orders Placed This Encounter    [facility] Pharmacy Services Referral - Chronic Pain and Opioid Stewardship    DISCONTD: BuPROPion (WELLBUTRIN XL) 300 mg XL tablet    Buprenorphine (SUBUTEX) 2 mg Sublingual Tablet    Diazepam (VALIUM) 2 mg Tablet         Encounter Summary:  Samuel Cobb is a 33yr -old male -old male who  has a past medical history of Anxiety and Diverticulitis. AND  has a past surgical history that includes tonsillectomy (1991); pr removal gallbladder (2011); and exploratory laparotomy (2015)..    Samuel Cobb was seen today in consultation at the [facility] for chronic cervical pain. He reports simply sitting up in bed at age 33 after waking up, with sudden pain in his neck. He describes severe right-sided cervical pain which "shoots down arms" with occasional numbness and tingling. He notes that the right upper extremity pain travels from his posterior neck to shoulder, terminating at his lateral mid-upper arm. His current pain medication  regimen consists of Buprenorphine transitioned from Norco 10/325mg  last month due to being "ineffective." He states that Gabapentin and Lyrica in the past provided no relief and were therefore discontinued. He was prescribed muscle relaxants in the past including Tizanidine and Flexeril but stated he was never able to pick them up and did not undergo a trial of the medications. Other medications he is taking include Diazepam for Anxiety, switched from Wellbutrin.    CURES report reviewed and no aberrant behavior has been noted.        He underwent completed 24 Physical Therapy sessions at [facility]. He endorses having a TENS unit at home but needs to order more parts. He underwent an ultrasound-guided injection with Orthopedics six years ago which he does not recall the details but mentions having strenuous activity changing his tire immediately afterward, which caused the pain to resume. His medical history is significant for Diverticulosis and Anxiety. Per Neurological Surgery visit 04/14/2019, "Referred to pain medicine specialist for neck pain injections (TPI vs facets). Pursue EMG/NCS, recommends bilateral upper extremity EMG/NCS. Follow-up after completion of above. Patient will call us for appointment. Follow up: Return if symptoms worsen or fail to improve, for After workup with Pain Medicine and completion of diagnostics, Pt will call us for appointment." Socially, he is currently unemployed but worked as a Conservation officer, nature at a gas station. He denies smoking cigarettes and use of smokeless tobacco products, history of alcoholism, and illicit  drug use.    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    On physical exam, he has  increased pain in right trapezius and right cervical paraspinal musculature with cervical rotation and extension along with myofascial tenderness. Palpation of these painful muscle bands resulted in concordant pain radiation from the right trapezius into his right deltoid region. There was no evidence of radicular or cervicogenic pain on examination today.    The primary encounter diagnosis was Myofascial pain. A diagnosis of Chronic, continuous use of opioids was also pertinent to this visit. He will benefit from a multimodal approach including continuing Physical Therapy exercises at home with use of TENS unit, consideration of Acupuncture, use of topical medications, and Trigger Point Injections at next available date. He was informed about risks and benefits of the proposed procedure and plan, and provided with written information. All questions were answered appropriately as patient expressed understanding and is in agreement with the aforementioned plan.      RECOMMENDATIONS/TREATMENT PLAN:   1. Myofascial pain  - Trigger Point Injections at next available date with Dr. Luis Abed  - Continue Physical Therapy exercises at home with use of TENS unit    2. Chronic, continuous use of opioids  - On Subutex (Buprenorphine) 2mg    - [facility] Pharmacy Services Referral - Chronic Pain and Opioid Stewardship     DISPOSITION AFTER TODAY'S VISIT:    Priority scheduling: next available   ATTENDING performing procedure: Dr. Luis Abed   PROCEDURE: Trigger Point Injections   SIDE(S): side(s) to be addressed at the time of the upcoming appointment   IMAGING: no imaging required   SPECIAL INSTRUCTIONS: no special instructions   COVID TEST: not needed      Covid-19 Comorbidity Risk Score: 1    (0-3 = Mild) (4-6 = Medium) (7+ = high)      Samuel Cobb understands the risk/benefits of his procedure above in the context of his calculated COVID-19 Comorbidity Risk Score.  After careful consideration, Samuel Cobb indicated he wished to  proceed with scheduling.            Dear Dr. Tasia Catchings, Ronald Lobo, MD    It was a pleasure to see your patient, Samuel Cobb, today in consultation at the [facility].  As you know, Samuel Cobb is a 33yr -old male with a chief complaint of:    Chief Complaint   Patient presents with    Neck Pain    Arm Pain     Right       Samuel Cobb is here for chronic complaint of right neck pain, which started 15+ year(s) ago. He associates the onset of pain with no known inciting event or injury.    He notes pain located in the areas indicated in the pain location diagram below.     Pain Location Diagram      ALLERGIES:  Allergies   Allergen Reactions    Regal-Am [Quinine Sulfate] Other-Reaction in Comments     shake    Toradol [Ketorolac Tromethamine] Other-Reaction in Comments     shake    Tramadol Other-Reaction in Comments     shake    Zoloft [Sertraline Hcl] Palpitations       CURRENT MEDICATIONS:  Outpatient Medications Marked as Taking for the 05/24/19 encounter (Office Visit) with Carmelina Dane, MD   Medication Sig Dispense Refill    Buprenorphine (SUBUTEX) 2 mg Sublingual Tablet 1 TABLET UNDER THE TONGUE AND ALLOW TO DISSOLVE EVERY 12 HRS SUBLINGUAL 30 DAYS      Diazepam (VALIUM) 2 mg Tablet TAKE0.5  1 TABLET BY ORAL ROUTE 2 TIMES EVERY DAY AS NEEDED      Promethazine (PHENERGAN) 25 mg Tablet Take 25 mg by mouth once daily if needed.         STOPPED PAIN MEDICATIONS:   Med #1 - Norco, reason for stopping - ineffective    OPIOID MEDICATIONS  Samuel Cobb was asked if they are taking opioid medications such as hydrocodone (Vicodin, Norco, Lortab, Zohydro), hydromorphone (Dilaudid, Exalgo), fentanyl (Duragesic, Actiq, Fentora, Subsys), morphine (MS Contin, Kadian, Avinza), oxycodone (Percocet, Roxycodone, Oxycontin), methadone, buprenorphine (Suboxone, Subutex, BuTrans), oxymorphone (Opana),  codeine (Tylenol #3 or #4), tramadol (Ultram), or tapentadol (Nucynta)               Samuel Cobb answers yes to current opioid use.    TIMING OF PAIN  Samuel Cobb states that the pain is present constantly (100% of the time)    PAIN QUALITY   Samuel Cobb describes the pain as burning, sharp, numbness and throbbing    PAIN INTENSITY - Verbal Analog Scale (VAS): 0-10 Scale (0 = No Pain, 10 = Worst Imaginable Pain)    Currently, Mr. Abascal has a VAS score of 7 / 10   On average, Samuel Cobb pain score for the last week has been a VAS of 7 / 10   At its best, Samuel Cobb's VAS pain score is 6 / 10   At its worst, Samuel Cobb's VAS pain score is 9 / 10     RELIEVING AND AGGRAVATING FACTORS  Samuel Cobb was asked if any of the following activities either alleviates or exacerbates his pain: lying down, standing, sitting walking, exercise, medications, relaxation, thinking about something else, coughing/sneezing, urination, or bowel movements    Samuel Cobb states his pain:   is relieved by: lying down, medication, relaxation     is aggravated by: lying down, standing, sitting, walking, exercise, coughing/sneezing, bowel movements    FUNCTIONAL ASSESSMENT (Pain Disability Index):   Measures the level of disability related to pain: 0-10 Scale (0= No Disability, 2=Minimal, 5=Moderate, 8=Severe, 10= Total Disability)    Family and home responsibilities: activities related to home and family 7   Recreation: hobbies sports and other leisure time activities 7   Social activity: participation with friends and acquaintances other than family members 6   Occupation: activities partly or directly related to working, including housework or volunteering 8   Sexual behavior: frequency and quality of sex life 4   Self-care: personal maintenance and independent daily living (bathing, dressing etc.) 2   Life-support activity: basic life-supporting behaviors (eating, sleeping, breathing, etc.) 2     REVIEW OF SYSTEMS:  Mr.  Maisano was asked to review the list below and report if he was currently experiencing any of the symptoms:     Constitutional  o fever or chills   o unplanned weight loss    Eyes  o double or blurred vision    ENT  o hearing loss   o difficulty swallowing    Hematologic/Lymphatic  o bleeding gums   o low platelet count    Endocrine  o heat intolerance   o cold intolerance  o thyroid problems    Integumentary  o skin rash    Respiratory  o shortness of breath   o wheezing    Cardiac  o palpitations (awareness of fast heart)   o chest pain    Gastrointestinal  o constipation   o abdominal pain   o nausea  o vomiting   o diarrhea    Genito-urinary  o sexual dysfunction   o urinary retention or difficulty urinating    Musculoskeletal  o back pain   o neck pain  o joint pain   o muscle pain    Neurological  o loss of consciousness or blackouts   o memory loss   o muscle weakness   o seizures   o trouble walking   o dizziness   o drowsiness    o excessive fatigue    Behavioral  o difficulty falling or remaining asleep   o loss of interest in hobbies or other activities   o difficulty concentrating   o feelings of guilt   o feeling depressed     Samuel Cobb ADMITS TO THE FOLLOWING OF THE ABOVE LISTED SYMPTOMS:  back pain, neck pain, joint pain, muscle pain, muscle weakness and dizziness    All other systems were negative.    The Review of Systems was reviewed.    PAST MEDICAL, FAMILY, SOCIAL HISTORY:    Family Life: Living Circumstances:    Samuel Cobb currently lives with an adult companion       Past Medical History:   Diagnosis Date    Anxiety     Diverticulitis        Past Surgical History:   Procedure Laterality Date    EXPLORATORY LAPAROTOMY  2015    PR REMOVAL GALLBLADDER  2011    TONSILLECTOMY  1991       Family History   Problem Relation Name Age of Onset    Other (GI abnormality) Mother           PSYCHOSOCIAL HISTORY:     Social History     Socioeconomic History    Marital status: UNKNOWN      Spouse name: Not on file    Number of children: Not on file    Years of education: Not on file    Highest education level: Not on file   Occupational History    Not on file   Social Needs    Financial resource strain: Not on file    Food insecurity     Worry: Not on file     Inability: Not on file    Transportation needs     Medical: Not on file     Non-medical: Not on file   Tobacco Use    Smoking status: Never Smoker    Smokeless tobacco: Never Used   Substance and Sexual Activity    Alcohol use: Not Currently     Frequency: Monthly or less     Comment: stopped after gallbladder surgery    Drug use: Yes     Frequency: 3.0 times per week     Types: Marijuana    Sexual activity: Not on file   Lifestyle    Physical activity     Days per week: Not on file     Minutes per session: Not on file    Stress: Not on file   Relationships    Social connections     Talks on phone: Not on file  Gets together: Not on file     Attends religious service: Not on file     Active member of club or organization: Not on file     Attends meetings of clubs or organizations: Not on file     Relationship status: Not on file    Intimate partner violence     Fear of current or ex partner: Not on file     Emotionally abused: Not on file     Physically abused: Not on file     Forced sexual activity: Not on file   Other Topics Concern    Not on file   Social History Narrative    Not on file     The patient  reports that he has never smoked. He has never used smokeless tobacco.    Mr. [name]'s past medical, family, and social history were reviewed.    PROBLEM LIST:  There is no problem list on file for this patient.      PAIN TREATMENTS:   Previous Injection Procedures: Mr. [name] did not undergo a recent procedure.    Prior Physical Therapy: The patient HAS been previously involved in physical therapy.  The patient states that physical therapy has been helpful.    PREVIOUS DIAGNOSTIC STUDIES:   Images: The report of  the image indicated below was reviewed. The image noted below was viewed.    C-Spine X-ray 04/14/2019 FINDINGS:  Alignment: There is normal alignment of the spine.  Vertebrae: No fractures or destructive changes. There is continued anterior interbody spurring C4-5. No pathological movement is seen from flexion to extension.  Prevertebral and paraspinal soft tissues: within normal limits.  IMPRESSION:  1. Continued degenerative spurs C4-5 without pathological movement.    MRI C-Spine 12/31/2018 FINDINGS:  Alignment: The alignment of the cervical vertebrae is slightly distorted with accentuation of the normal lordosis in the upper cervical region and loss of the normal lordosis around C6. In addition, there is trace retrolisthesis of C5 on C6.    Vertebral body heights and marrow: Vertebral body height is maintained. 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    Spinal Cord:The craniocervical junction is unremarkable. There is no absolute evidence of myelopathic changes within the cord.    Soft tissues: Normal.     At C2-3: Mild uncovertebral hypertrophy is present with no effect on the neural structures.    At C3-4:The canal and foramina are patent.    At C4-5:Global disc bulging, uncovertebral hypertrophy contribute to mild flattening of the ventral thecal sac without spinal stenosis. There is no significant foraminal narrowing.    At C5-6:A broad-based disc bulge is present which is slightly lateralized to the right. An annular tear is present posteriorly, which could be pain generating. There is associated uncovertebral and laminar hypertrophy, resulting in mild posterior displacement of the thecal sac. There is mild left foraminal narrowing and moderate right foraminal narrowing.    At C6-7:A global disc bulges present with an associated annular tear. There is significant uncovertebral hypertrophy. There is moderate spinal stenosis and moderately severe bilateral foraminal  narrowing at this level.     At C7-T1:The canal and foramina are normally patent.    IMPRESSION:  1. Relatively severe spondylosis at C6-7 with moderate spinal stenosis and moderately severe bilateral foraminal narrowing.  2. Broad-based disc bulges at C5-6 and C6-7 which harbor annular tears.       Labs: The lab/study results below were reviewed.    EMG/NCS 05/12/2019  ELECTRODIAGNOSTIC FINDINGS:   1. Normal sensory nerve conduction studies in bilateral median, ulnar and right radial nerves   2. Normal motor nerve conduction in bilateral median and ulnar nerves. The difference of onset latencies is borderline prolonged in left ulnar inching study of the segment between elbow and 2.5 cm distal to elbow   3. Needle EMG was performed in selective muscles of bilateral  upper extremities that were within normal range except slightly decreased recruitment of left FCU muscle   CLINICAL IMPRESSION:   Borderline prolonged difference of onset latencies in left ulnar nerve between elbow and 2/5 com distal to the elbow. 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       OTHER PAIN PROBLEMS:  The patient does report pain in other areas: bilateral low back    LEGAL ISSUES:    Mr. Burkhalter is not currently involved in litigation pertaining to the pain complaint.     He does not have a prior history of arrests or legal problems.     He has not filed a Radiation protection practitioner related to the pain complaint.    PSYCHOLOGICAL HISTORY AND TREATMENTS:      Mr. Levario has been evaluated or treated by a psychiatrist, psychologist, or counselor.  For anxiety and depression.    DEPRESSION SCALE (Patient Health Questionnaire PHQ-9)  1. Little interest or pleasure in doing things: 1  2. Feeling down, depressed or hopeless: 1  3. Trouble falling or staying asleep, or sleeping too much: 0  4. Feeling tired or having little energy: 1  5. Poor  appetite or overeating: 0  6. Feeling bad about yourself - or that you are a failure or have let yourself or your family down: 0  7. Trouble concentrating on things such as reading the paper or watching television: 0  8. Moving or speaking so slowly that other people could have noticed?  Or the opposite - being so fidgity or restless that you have been moving around more than usual: 0  9. Thoughts that you would be better off dead or of hurting yourself in some way: 0  If >  1: Please notify a mental health provider or the patients primary care provider with any concerns about acute risk for suicide.   10. If you checked off any problems, how difficult have these problems made it for you to do your work, take care of things at home, or get along with other people?: Somewhat difficult  PHQ-9 Score: 3     PHQ9 Affective-Cognitive Total: 2/15  PHQ9 Physical Total: 1/12        Total Score Depression Severity   1-4 Minimal Depression   5-9 Mild Depression   10-14 Moderate Depression   15-19 Moderately Severe Depression   20-27 Severe Depression         He does not have a past history of suicide attempts.      Mr. Owczarzak psychological, depression, and suicidality history were reviewed.  The patient appears to be at low acute risk for suicide or self-harm at this time.    EFFECT OF PAIN ON EMPLOYMENT   Mr. Feger employment status (see below) has  been affected by the present pain condition.     He has been unemployed due to the pain condition for 4 month(s).      PHYSICAL EXAM:    Vitals:    05/24/19 0957  BP: 128/74   SITE: left arm   Orthostatic Position: sitting   Cuff Size: large   Pulse: 90   Resp: 16   Temp: 36.2 C (97.1 F)   TempSrc: Temporal   Weight: 73.2 kg (161 lb 6 oz)   Height: 1.702 m ( )     Body mass index is 25.28 kg/m.   Constitutional: Normally developed, no deformities,well groomed., healthy, alert, no distress, pleasant affect, cooperative, skin warm, dry, and pink  Eyes:   conjunctivae clear. EOM's intact.  Nose:  normal.  Mouth: normal.  Respiratory: clear to auscultation.  Cardiovascular:  normal rate and regular rhythm, no murmurs, clicks, or gallops.    Neuro: Movement Assessment Upper Limb  Bulk:   normal  Tone:  normal  Abnormal Movements:  no    Strength Movement Root Nerve Muscle   Bilateral 5 Shoulder abduction C5/6 Axillary Deltoid   Bilateral 5 Elbow flexion C5/6 Musculocut., Radial Biceps, Brachioradialis   Bilateral 5 Elbow extension C6/7/8 Radial Triceps   Bilateral 5 Wrist extension C5/6 Radial Ext. carpi rad. longus   Bilateral 5 Finger extension C7/8 Post. Interos. N. (radial) Ext. dig. comm.   Bilateral 5 Finger flexion (index) C7/8 Ant. Interos. N. (median) Flexor dig. Prof. (index)   Bilateral 5 Finger flexion (ring, little) C7/8 Ulnar Flex. dig. prof. (ring + little)   Bilateral 5 Finger abduction C8/T1 Ulnar 1st dorsal interos.   Bilateral 5 Thumb abduction C8/T1 Median ABD. Pol. brevis       Upper extremity reflexes: biceps - bilateral 2+  brachioradialis - bilateral 2+  triceps - bilateral 2+  Sensory Assessment:  Sensory:  There is no .  allodynia in the bilateral upper extremity and hyperalgesia in the bilateral upper extremity    Psych:   Appearance/Cooperation: in no apparent distress, well developed and well nourished, non-toxic, in no respiratory distress and acyanotic, alert, oriented times 3 and well groomed and dressed   Attitude: pleasant    Behavior :normal   Eye Contact: normal   Attention Span: good   Speech: normal volume, rate, and pitch    Cervical Spine:  range of motion is mildly restricted with flexion and is associated with no change in pain.  range of motion is mod restricted with extension and is associated with increase in pain.  range of motion is mildly restricted with lateral flexion and is associated with no change in pain.  range of motion is mildly restricted with rotation and is associated with increase in pain.  Spurling's  manuever bilaterally cause no change in pain    Myofascial exam:The patient did have myofascial tenderness in the right trapezius, and right cervical paraspinal muscle regions. Trigger points palpated along trapezius and cervical paraspinal muscles with pain radiating to right shoulder.    MEDICAL DECISION MAKING     Records Reviewed:   Lab/study reports reviewed above were important and necessary because subsequent medical and treatment recommendations required review of the above lab/study reports and no contraindications to proceeding with treatment were identified.  (See encounter summary and plan for specifics)  Images viewed/reviewed above were important and necessary because subsequent medical and treatment recommendations required review of the above image(s) and no contraindications to proceeding with treatment were identified.  (See encounter summary and plan for specifics)     Review of the Risk of Comorbidities:  At this juncture, we believe that the patient's constitutional status adds no risk and complexity to our proposed evaluation and treatment.  Low  Risk procedures ordered:  TPI     ASSESSMENT AND RECOMMENDATIONS/TREATMENT PLAN:   Please see the beginning of the note.       - The patient was instructed and educated on all aspects of the plan of care.  The patient acknowledged the plan of care.  - I saw and evaluated the patient with my attending physician, Dr. Charlyne Quale.    Mylie Mccurley I Jurline Folger, MD  Fellow  Department of Anesthesiology & Pain Medicine

## 2019-05-24 ENCOUNTER — Encounter: Payer: Self-pay | Admitting: ANESTHESIOLOGISTS

## 2019-05-24 ENCOUNTER — Ambulatory Visit: Payer: Medicaid (Managed Care) | Attending: ANESTHESIOLOGISTS | Admitting: ANESTHESIOLOGISTS

## 2019-05-24 VITALS — BP 128/74 | HR 90 | Temp 97.1°F | Resp 16 | Ht 67.0 in | Wt 161.4 lb

## 2019-05-24 DIAGNOSIS — F1120 Opioid dependence, uncomplicated: Secondary | ICD-10-CM | POA: Insufficient documentation

## 2019-05-24 DIAGNOSIS — M25511 Pain in right shoulder: Secondary | ICD-10-CM | POA: Insufficient documentation

## 2019-05-24 DIAGNOSIS — M542 Cervicalgia: Secondary | ICD-10-CM | POA: Insufficient documentation

## 2019-05-24 DIAGNOSIS — R200 Anesthesia of skin: Secondary | ICD-10-CM | POA: Insufficient documentation

## 2019-05-24 DIAGNOSIS — G8929 Other chronic pain: Secondary | ICD-10-CM | POA: Insufficient documentation

## 2019-05-24 DIAGNOSIS — F1190 Opioid use, unspecified, uncomplicated: Secondary | ICD-10-CM | POA: Insufficient documentation

## 2019-05-24 DIAGNOSIS — Z79899 Other long term (current) drug therapy: Secondary | ICD-10-CM | POA: Insufficient documentation

## 2019-05-24 DIAGNOSIS — R202 Paresthesia of skin: Secondary | ICD-10-CM | POA: Insufficient documentation

## 2019-05-24 DIAGNOSIS — M7918 Myalgia, other site: Secondary | ICD-10-CM | POA: Insufficient documentation

## 2019-05-24 NOTE — Nursing Note (Signed)
Identified pt using Name and Date Of Birth.Vital signs taken, screened for pain, screen for mobility. Allergies verified. Pharmacy updated. Deonna Krummel MA II.    Patient mask status: Patient was wearing a mask.   Droplet precautions were followed when caring for the patient.   PPE used by providers during encounter: Surgical masks/ face shields worn.    PHQ-9 scores entered into patient's chart.      Abie Killian, MA II

## 2019-05-24 NOTE — Patient Instructions (Signed)
Here is a limited list of some acupuncturists in the community who do acupuncture with you as the only patient.     At the bottom of the list are places that offer "COMMUNITY ACUPUNCTURE". These places offer acupuncture at a nominal cost (see below for details)    NOTE:  None of the individuals or clinics on this list are affiliated with the  [facility] or the  [facility]. Furthermore, since we do not know any one of them, we are unable to endorse any one person in terms of his/her skills and qualifications. Understand that there are many other acupuncturists in the community, and you are encouraged to actively research their skills and qualifications.      [place]    [facility] [place]  No reviews  Acupuncture Clinic  [address]  [phone]  [email or web address]    [facility]  [address]  [place], [place] [address]  Phone number [phone]  [email or web address]    [facility]  [address]  [place], [place]  [phone]  [email or web address]    [facility]  [address]  [place], [place] [address]  Phone number [phone]  [email or web address]    [facility]  [address]  [place], [place] [address]  Phone number [phone]  [email or web address]    [facility]  [address]  , [place] [address]  Phone number [phone]  [email or web address]       [place]    [facility]  [address]   [address]  [place], [place] [address]   [phone]   [email or web address]    [facility]  [address], [place], [place] [address]  [email or web address]  [phone]  [email or web address]    [place]    [facility]  [address], [place]  [phone]  [email or web address]    [facility]  [address]  , [place] [address]  Phone number [phone]  [email or web address]    [place]    [facility]  [address]  [address]  [place], [place] [address]  [phone]  [email or web address]    [place]    [facility]  Acupuncture Clinic  Address: [address], [place], [place] [address]  [phone]  [email or web address]    [facility]  [address]  [place] [address]  Phone number [phone]  [email or web address]      [place]    [facility]   [address]   [place]   [phone]       COMMUNITY ACUPUNCTURE CLINICS: The goal of of these clinics is to make acupuncture accessible by providing affordable community-centered health care. Treatments are conducted in a group setting, but all acupuncture treatments are individualized and are performed in a quiet community space. This is the most common way to see acupuncture performed in [place]. Community acupuncture clinics charge on a sliding scale. You decide what you can afford within their sliding scale. There is an additional  paperwork fee for your first appointment. It is up to you to contact the clinic to confirm the most current pricing.      [facility]   [address],     [phone]    [email or web address]  [place] [facility]   [address],   [place]    [phone]    [email or web address]  [facility]   [address], [place]    [phone]    [email or web address]  [facility]   [address]     [phone]    [email or web address]    To learn more about community acupuncture in the region visit: http://www.sacbee.com/news/local/health-and-medicine/healthy-choices/article46651955.html#storylink=cpy    All of the below are available over-the-counter from any major stores and pharmacies. Your Pain Management physician may only want you to try one of the below and has discussed this with you at your visit. These topical/transdermal medications contain either lidocaine 4% (which is an anesthetic) or salicylate (which is an anti-inflammatory). If you are unsure which one to use, please ask your Pain Management physician.    ========================================================================================================================================  [email or web address]          Active Ingredient  Trolamine salicylate 10% . . . . . . . . . . . . Topical analgesic  ========================================================================================================================================                    Active Ingredients  Menthol 3%...Topical analgesic  Methyl salicylate 10% (NSAID*)...Topical analgesic  *nonsteroidal anti-inflammatory drug

## 2019-05-24 NOTE — Progress Notes (Signed)
Patient WAS wearing a surgical mask  Contact, Droplet and Airborne precautions were followed when caring for the patient.   PPE used by provider during encounter: Surgical mask and Face Shield/Goggles  ========================================================================================================================================    I (Jae Skeet, MD) evaluated and examined the patient with the pain fellow on the date indicated in the fellow's note.  I discussed the case with the Fellow, Dr. Essiet and agree with the findings.  We developed the plan together as outlined in the pain fellow's note.       The patient was instructed and educated on all aspects of the plan of care. The patient acknowledged the plan of care.    G Aigner Horseman, MD  Attending Physician  Department of Anesthesiology & Pain Medicine    Note Electronically Signed By:  Shamila Lerch G. Karema Tocci, MD  Professor of Anesthesiology and Pain Medicine  Division of Pain Medicine

## 2019-06-21 ENCOUNTER — Ambulatory Visit: Payer: Medicaid (Managed Care) | Attending: PAIN MANAGEMENT | Admitting: PAIN MANAGEMENT

## 2019-06-21 VITALS — BP 115/78 | HR 78 | Temp 97.5°F | Resp 16 | Ht 67.0 in

## 2019-06-21 DIAGNOSIS — M7918 Myalgia, other site: Secondary | ICD-10-CM | POA: Insufficient documentation

## 2019-06-21 NOTE — Patient Instructions (Signed)
PAIN MANAGEMENT CLINIC  AFTER VISIT INSTRUCTIONS    Procedure Completed Today:  Trigger Point Injection   Pain Clinic [phone] or [phone]: available during business hours, 8 am - 5 pm  On-call physician [phone] THIS IS A NUMERICAL PAGER -DO NOT LEAVE VOICE MESSAGE.  Physician is available after business hours, 7 days per week including holidays.        If you experience any of the following symptoms after your procedure, please notify our office or on-call physician immediately (see above for contact information):   fever (increased oral temperature)   bleeding or swelling at the injection site,    drainage, rash or redness at the injection site    possible signs of infection    increased pain at the injection site   worsening of your usual pain   severe headache   new or worsening numbness    new arm and/or leg weakness, or    changes in bowel and/or bladder function: urinating or defecating on yourself and not knowing that you did it.    PLEASE FOLLOW ALL INSTRUCTIONS CAREFULLY     Do not drive or operate heavy machinery for 12 hours.   Do not engage in strenuous activity (e.g., lifting or pushing heavy objects or repeated bending) for 24 hours.     Do not take a bath, swim or use Jacuzzi for 24 hours after procedure. (A shower is fine).   Remove any Band-Aids when you get home.    Use cold/ice, as needed for comfort.  We recommend the use of cold therapy alternating on for 20 minutes, off for 20 minutes.    Do not apply direct heat (heating pad or heat packs) to the injection site for 24 hours.     Resume your usual medications, unless instructed otherwise by your Pain Physician.     If you are on warfarin (Coumadin) or other blood thinner, resume this medication as instructed by your prescribing Physician.    Keep a record of your response to the injection you had today.     How much relief did you get?   Were you able to decrease the use of any of your pain  medications?   Were you able to increase your level of activity?   How long did the relief last?     NOTE:  If your Pain Physician ordered a procedure, lab, diagnostic study (x-ray, MRI, CT, ultrasound, EMG, EKG, etc.), or specialty referral for you today, please be aware that some insurance carriers may require authorization before they can be scheduled. If these cannot be scheduled at the time of discharge today, you will be contacted with the scheduled appointment.  If you are not contacted within 3-5 business days, please call our office at [phone].        Please schedule follow up for next appointment  VERY IMPORTANT  INSTRUCTIONS FOR YOUR FOLLOW UP APPOINTMENT  Please remember to arrive 15 minutes prior to your appointment time.  This will allow time for check in.  If you arrive after your scheduled appointment time your appointment could be rescheduled or your visit with the physician will be shortened.      NOTE: Please be aware of which location you are scheduled for your next appointment. The [facility] has 2 locations in [place]: (1) the [facility] at the [facility] and (2) [facility] [place]   [facility]. If you have any questions regarding the location of your next appointment, please call our office: [phone].          MEDICATIONS    If your Pain Physician prescribes medications for you, please anticipate when you will require a refill.  It can take 1-3 business days to complete the process. Contact your pharmacy for medication refills. Your pharmacy will contact the Pain Clinic office with detailed medication information.     Driving When You Are Taking Medications    For most people, driving represents freedom, control and independence. Driving enables most people to get to the places they want or  need to go. For many people, driving is important economically - some drive as part of their job or to get to and from work.   Driving is a complex skill. Our ability to drive or operate heavy or dangerous machinery safely can be affected by changes in our physical, emotional and mental condition. The goal of this brochure is to help you and your health care professional talk about how your medications may affect your ability to drive safely.     How can medications affect my driving or ability to operate heavy or dangerous machinery?   People take medications for a variety of reasons. Those can include:          allergies          anxiety          colds          depression          diabetes          heart and cholesterol conditions          high blood pressure          muscle spasms          pain          Parkinson's disease          schizophrenia   Medications may be prescribed by your doctor or purchased over-the-counter without a doctor's prescription. Many individuals also take herbal supplements. Some of these medications and supplements may cause a variety of reactions that may make it more difficult for you to drive a car or operate heavy or dangerous machinery safely. These reactions may include:          sleepiness          blurred vision          dizziness          slowed movement          fainting          inability to focus or pay attention          nausea   Often people take more than one medication at a time. The combination of different medications can cause problems for some people. This is especially true for older adults because they take more medications than any other age group. Due to changes in the body as people age, older adults are more prone to medication related problems. The more medications you take, the greater your risk that your medicines will affect your ability to drive safely. To help avoid problems, it is important that at least once a year you talk to your doctor or pharmacist  about all the medications - both prescription and over-the-counter - you are taking. Also let your professional know what herbal supplements, if any, you   are taking. Do this even if your medications and supplements are not currently causing you a problem.     Can I still drive or operate heavy or dangerous machinery safely if I am taking medications?     Yes, most people can drive or operate heavy or dangerous machinery safely if they are taking medications. It depends on the effect those medications - both prescription and over-the-counter - have on your coordination and mental function. In some cases you may not be aware of the effects. But, in many instances, your doctor can help to minimize the negative impact of your medications on your driving or operate heavy or dangerous machinery in several ways. Your doctor may be able to:          adjust the dose;          adjust the timing of doses or when you take the medication;          add an exercise or nutrition program to lessen the need for medication; and          change medication to one that causes less drowsiness.     What can I do if I am taking medications?   Talk to your doctor honestly.   When your doctor prescribes a medicine for you, ask about side effects. How should you expect the medicine to affect your ability to drive or operate heavy or dangerous machinery? Remind your doctor of other medications - both prescription and over-the-counter - and herbal supplements you are taking, especially if you see more than one doctor. Talking honestly with your doctor also means telling the doctor if you are not taking all or any of the prescribed medication. Do not stop taking your medication unless your doctor tells you to.   Ask your doctor if you should drive - especially when you first take a medication.   Taking a new medication can cause you to react in a number of ways. It is recommended that you do not drive or operate heavy or dangerous machinery when  you first start taking a new medication until you know how that drug affects you. You also need to be aware that some over-the-counter medicines and herbal supplements can make it difficult for you to drive or operate heavy or dangerous machinery safely.     Talk to your pharmacist.   Get to know your pharmacist. Ask the pharmacist to go over your medications with you and to remind you of effects they may have on your ability to drive or operate heavy or dangerous machinery safely. Be sure to request printed information about the side effects of any new medication. Remind your pharmacist of other medicines and herbal supplements you are taking. Pharmacists are available to answer questions wherever you get your medications. Many people buy medicines by mail. Mail-order pharmacies have a toll-free number you can call and a pharmacist available to answer your questions about medications.   Monitor yourself.   Learn to know how your body reacts to the medications and supplements. Keep track of how you feel after you take the medication. For example, do you feel sleepy? Is your vision blurry? Do you feel weak and slow? When do these things happen?   Let your doctor and pharmacist know what is happening.   No matter what your reaction is to taking a medicine - good or bad - tell your doctor and pharmacist. Both prescription and over-the-counter medications are powerful-that's why they work. Each   person is unique. Two people may respond differently to the same medicine. If you are experiencing side effects, the doctor needs to know that in order to adjust your medication. Your doctor can help you find medications that work best for you.     What if I have to cut back or give up driving?   You can keep your independence even if you have to cut back or give up on your driving due to your need to take medications. It may take planning ahead on your part, but it will get you to the places you want to go and the people you  want to see. Consider:          rides with family and friends;          taxi cabs;          shuttle buses or vans;          public buses, trains and subways; and          walking.   Also, senior centers and religious and other local service groups often offer transportation services for older adults in the community.     Thank you for choosing [facility]

## 2019-06-21 NOTE — Nursing Note (Signed)
Patient WAS wearing a surgical mask  Droplet precautions were followed when caring for the patient.   PPE used by provider during encounter: Surgical mask and Face Shield/Goggles

## 2019-06-21 NOTE — Progress Notes (Signed)
Patient WAS wearing a surgical mask  Droplet precautions were followed when caring for the patient.   PPE used by provider during encounter: Surgical mask, Face Shield/Goggles, and Gloves     - The patient was seen, evaluated, and care plan was developed with the pain fellow, Dr. Hijazi, on the date indicated in the Pain fellow's note. I agree with the findings and plan as outlined in the Pain fellow's note.  - INFORMED CONSENT:  The patient's condition and proposed procedures, risks, and alternatives were discussed with the patient or responsible party.  The patient's / responsible party's questions were answered.   The patient / responsible party appeared to understand and chose to proceed.  Informed consent was obtained.  - PROCEDURE PAUSE:  A procedure pause verifying correct patient, medical record number, allergies, and surgical site was performed immediately prior to beginning the procedure.  - I was physically present during key portion(s) of the procedure, including the procedural pause and the initiation of the procedure and, during other times, was immediately available to return to the procedure.  Please see procedure note in EMR for details.  - The patient was instructed and educated on all aspects of the plan of care.  The patient acknowledged the plan of care.    Maggy Wyble, MD  Department of Anesthesiology & Pain Medicine  Attending    Dear Patient: If you are reviewing this progress note and have questions about the meaning or medical terms being used, please schedule an appointment or bring it up at your next follow-up appointment.  Medical notes are a communication tool between medical professionals and require medical terms to be used for efficiency. You can also look up terms via on-line medical dictionaries such as Medline Plus at https://www.nlm.nih.gov/medlineplus/mplusdictionary.html

## 2019-06-21 NOTE — Progress Notes (Deleted)
Patient WAS wearing a surgical mask  Contact, Droplet and Airborne precautions were followed when caring for the patient.   PPE used by provider during encounter: Surgical mask, Face Shield/Goggles and Gloves  ========================================================================================================================================     [facility]  Department of Anesthesiology and Pain Medicine  [address], [address]  [place], [place] [address]  Phone: [phone]    Fax: [phone]    Date: 06/21/2019    Re: Samuel JacobsonMichael Weinhold  MR#: [id number]  Date of Birth: 04/12/1986  Age: 33yr    Procedure:  Trigger Point Injections - Location of Myofascial Pain -  Right/Left/Bilateral:14168 *** Region    See Procedure Note for Discussion and Recommendations    Dear Dr. Janalyn ShyLasich, Earvin Hansenhristina Lynn, MD    We had the pleasure of treating your patient, Samuel Cobb, today [facility].  As you know, the patient is a 33yr -old male with a chief complaint of:    Chief Complaint   Patient presents with    Arm Pain    Neck Pain       Mr. Samuel Cobb states that the pain problem started when he was 33 years old. "Sat up in bed one day and bam."    He notes pain located in the areas indicated in the pain location diagram below.     Pain Location Diagram       Mr. Samuel Cobb describes the pain as pins and needles, sharp, shooting, throbbing and pressure    Pain Intensity (VAS): 0-10 Scale (0 = No Pain, 10 = Worst Imaginable Pain)  Currently, Mr. Samuel Cobb has a VAS score of 8 / 10   On average, Mr. [name]'s pain score for the last week has been a VAS of 8 / 10     Patient Active Problem List   Diagnosis    Chronic abdominal pain    Anxiety    Degeneration of cervical intervertebral disc       RESULTS OF MOST RECENT PROCEDURE: Mr. Samuel Cobb did not undergo a recent procedure.Marland Kitchen.     PREVIOUS DIAGNOSTIC STUDIES: The lab/imaging results below were reviewed.   X-ray of  cervical spine       Mr. Samuel Cobb has not had an infection, fever, or chills in the past 1 week.   Mr. Samuel Cobb has not taken any antibiotics in the past 1 week.     Mr. Samuel Cobb does not have a history of problems with anesthesia in the past.     Mr. Samuel Cobb had solid food greater than eight (8) hours ago.   Mr. Samuel Cobb had clear liquids less than two (2) hours ago.     He was questioned regarding a history of stridor, snoring, or sleep apnea. Mr. Samuel Cobb states that he has a history of neither stridor, snoring, nor sleep apnea.    ***  No outpatient medications have been marked as taking for the 06/21/19 encounter (Appointment) with [facility].       The patient has not taken blood thinning medications such as ASA, NSAIDs, Plavix, Ticlid, and Coumadin.    Allergies   Allergen Reactions    Regal-Am [Quinine Sulfate] Other-Reaction in Comments     shake    Toradol [Ketorolac Tromethamine] Other-Reaction in Comments     shake    Tramadol Other-Reaction in Comments     shake    Zoloft [Sertraline Hcl] Palpitations       Past Medical History:   Diagnosis  Date    Anxiety     Diverticulitis        Past Surgical History:   Procedure Laterality Date    EXPLORATORY LAPAROTOMY  2015    PR REMOVAL GALLBLADDER  2011    TONSILLECTOMY  1991       Family History   Problem Relation Name Age of Onset    Other (GI abnormality) Mother         Social History     Socioeconomic History    Marital status: UNKNOWN     Spouse name: Not on file    Number of children: Not on file    Years of education: Not on file    Highest education level: Not on file   Occupational History    Not on file   Social Needs    Financial resource strain: Not on file    Food insecurity     Worry: Not on file     Inability: Not on file    Transportation needs     Medical: Not on file     Non-medical: Not on file   Tobacco Use    Smoking status: Never Smoker    Smokeless tobacco: Never Used   Substance and Sexual Activity    Alcohol  use: Not Currently     Frequency: Monthly or less     Comment: stopped after gallbladder surgery    Drug use: Yes     Frequency: 3.0 times per week     Types: Marijuana    Sexual activity: Not on file   Lifestyle    Physical activity     Days per week: Not on file     Minutes per session: Not on file    Stress: Not on file   Relationships    Social connections     Talks on phone: Not on file     Gets together: Not on file     Attends religious service: Not on file     Active member of club or organization: Not on file     Attends meetings of clubs or organizations: Not on file     Relationship status: Not on file    Intimate partner violence     Fear of current or ex partner: Not on file     Emotionally abused: Not on file     Physically abused: Not on file     Forced sexual activity: Not on file   Other Topics Concern    Not on file   Social History Narrative    Not on file       Mr. Scarola's past medical, family, and social history were reviewed.    PHYSICAL EXAM:    There were no vitals filed for this visit.  There is no height or weight on file to calculate BMI.     Constitutional:  General Appearance:12749  Airway - Required for Procedures:  ZOXWRU:04540  Respiratory:  Lung Exam Brief:10013.  Cardiovascular:   Heart Exam:10535.        ASA Status:  2 - Mild, controlled systemic disease and no functional limitations    Preprocedural Diagnosis    ICD-10-CM    1. Myofascial pain  M79.18 TRIGGER POINT INJECTION        No orders of the defined types were placed in this encounter.       closing and work comp [employer]    ***  Department of Anesthesiology & Pain Medicine  Attending    ========================================================================================================================================  Mr. Faucett: If you are reviewing this progress note and have questions about the meaning or medical terms being used, please schedule an appointment or bring it up at your next  follow-up appointment.  Medical notes are a communication tool between medical professionals and require medical terms to be used for efficiency. You can also look up terms via on-line medical dictionaries such as Medline Plus at [employer].it.html

## 2019-06-21 NOTE — Progress Notes (Signed)
Patient WAS wearing a surgical mask  Contact, Droplet and Airborne precautions were followed when caring for the patient.   PPE used by provider during encounter: Surgical mask, Face Shield/Goggles and Gloves  ========================================================================================================================================    [facility]  Department of Anesthesiology and Pain Medicine  [address]  [place], [place] [address]  Phone: [phone]    Fax: [phone]    Date: 06/21/2019    Re: Samuel Cobb  MR#: [id number]  Date of Birth: October 01, 1985  Age: 33yr    Procedure:  Trigger Point Injections - Location of Myofascial Pain - Bilateral cervical Region    See Procedure Note for Discussion and Recommendations    Dear Dr. Tasia Catchings, Ronald Lobo, MD    We had the pleasure of treating your patient, Samuel Cobb, today at the [facility].  As you know, the patient is a 33yr -old male with a chief complaint of:    Chief Complaint   Patient presents with    Arm Pain    Neck Pain       Samuel Cobb states that the pain problem started when he was 33 years old. "Sat up in bed one day and bam."    He notes pain located in the areas indicated in the pain location diagram below.     Pain Location Diagram       Samuel Cobb describes the pain as pins and needles, sharp, shooting, throbbing and pressure    Pain Intensity (VAS): 0-10 Scale (0 = No Pain, 10 = Worst Imaginable Pain)  Currently, Samuel Cobb has a VAS score of 8 / 10   On average, Samuel Cobb pain score for the last week has been a VAS of 8 / 10     Patient Active Problem List   Diagnosis    Chronic abdominal pain    Anxiety    Degeneration of cervical intervertebral disc       RESULTS OF MOST RECENT PROCEDURE: Samuel Cobb did not undergo a recent procedure.Marland Kitchen     PREVIOUS DIAGNOSTIC STUDIES: The lab/imaging results below were reviewed.   X-ray of cervical  spine       Samuel Cobb has not had an infection, fever, or chills in the past 1 week.   Samuel Cobb has not taken any antibiotics in the past 1 week.     Samuel Cobb does not have a history of problems with anesthesia in the past.     Samuel Cobb had solid food greater than eight (8) hours ago.   Samuel Cobb had clear liquids less than two (2) hours ago.     He was questioned regarding a history of stridor, snoring, or sleep apnea. Samuel Cobb states that he has a history of neither stridor, snoring, nor sleep apnea.    Outpatient Medications Marked as Taking for the 06/21/19 encounter (Office Visit) with Marylynn Pearson, MD   Medication Sig Dispense Refill    Buprenorphine (SUBUTEX) 2 mg Sublingual Tablet 1 TABLET UNDER THE TONGUE AND ALLOW TO DISSOLVE EVERY 12 HRS SUBLINGUAL 30 DAYS      Diazepam (VALIUM) 2 mg Tablet TAKE0.5  1 TABLET BY ORAL ROUTE 2 TIMES EVERY DAY AS NEEDED      Promethazine (PHENERGAN) 25 mg Tablet Take 25 mg by mouth once daily if needed.         The patient has not taken blood thinning medications such as ASA, NSAIDs, Plavix, Ticlid,  and Coumadin.    Allergies   Allergen Reactions    Regal-Am [Quinine Sulfate] Other-Reaction in Comments     shake    Toradol [Ketorolac Tromethamine] Other-Reaction in Comments     shake    Tramadol Other-Reaction in Comments     shake    Zoloft [Sertraline Hcl] Palpitations       Past Medical History:   Diagnosis Date    Anxiety     Diverticulitis        Past Surgical History:   Procedure Laterality Date    EXPLORATORY LAPAROTOMY  2015    PR REMOVAL GALLBLADDER  2011    TONSILLECTOMY  1991       Family History   Problem Relation Name Age of Onset    Other (GI abnormality) Mother         Social History     Socioeconomic History    Marital status: UNKNOWN     Spouse name: Not on file    Number of children: Not on file    Years of education: Not on file    Highest education level: Not on file   Occupational History    Not on file    Social Needs    Financial resource strain: Not on file    Food insecurity     Worry: Not on file     Inability: Not on file    Transportation needs     Medical: Not on file     Non-medical: Not on file   Tobacco Use    Smoking status: Never Smoker    Smokeless tobacco: Never Used   Substance and Sexual Activity    Alcohol use: Not Currently     Frequency: Monthly or less     Comment: stopped after gallbladder surgery    Drug use: Yes     Frequency: 3.0 times per week     Types: Marijuana    Sexual activity: Not on file   Lifestyle    Physical activity     Days per week: Not on file     Minutes per session: Not on file    Stress: Not on file   Relationships    Social connections     Talks on phone: Not on file     Gets together: Not on file     Attends religious service: Not on file     Active member of club or organization: Not on file     Attends meetings of clubs or organizations: Not on file     Relationship status: Not on file    Intimate partner violence     Fear of current or ex partner: Not on file     Emotionally abused: Not on file     Physically abused: Not on file     Forced sexual activity: Not on file   Other Topics Concern    Not on file   Social History Narrative    Not on file       Samuel Cobb's past medical, family, and social history were reviewed.    PHYSICAL EXAM:    Vitals:    06/21/19 0950 06/21/19 1027   BP: 133/78 117/72   Pulse: 78 65   Resp: 16 16   Temp: 36.4 C (97.5 F)    TempSrc: Temporal    SpO2: 99% 99%   Height: 1.702 m (5\' 7" )      Body mass index is 25.28 kg/m.  Constitutional: Normally developed, no deformities,well groomed.  Airway - Required for Procedures: Mallampati class 1, Oral Eval: Mouth opening normal, neck ROM  full, Thyroid-Mentum distance in fingerbreaths  3  Respiratory: clear to auscultation.  Cardiovascular:  normal rate and regular rhythm, no murmurs, clicks, or gallops.        ASA Status:  2 - Mild, controlled systemic disease and no  functional limitations    Preprocedural Diagnosis    ICD-10-CM    1. Myofascial pain  M79.18 TRIGGER POINT INJECTION        No orders of the defined types were placed in this encounter.      - INFORMED CONSENT:  The patient's condition and proposed procedures, risks, and alternatives were discussed with the patient or responsible party.  The patient's / responsible party's questions were answered.   The patient / responsible party appeared to understand and chose to proceed.  Informed consent was obtained.  - PROCEDURE PAUSE:  A procedure pause verifying correct patient, medical record number, allergies, and surgical site was performed immediately prior to beginning the procedure.  - The patient was instructed and educated on all aspects of the plan of care.  The patient acknowledged the plan of care.  - I saw and evaluated the patient with my attending physician, Dr. Liane Comber.    Kerri Perches DO  Pain Management Fellow  Department of Anesthesiology & Pain Medicine    ========================================================================================================================================     Mr. Dyas: If you are reviewing this progress note and have questions about the meaning or medical terms being used, please schedule an appointment or bring it up at your next follow-up appointment.  Medical notes are a communication tool between medical professionals and require medical terms to be used for efficiency. You can also look up terms via on-line medical dictionaries such as Medline Plus at [employer]ments.it.html

## 2019-06-21 NOTE — Progress Notes (Signed)
PROCEDURE: Trigger Point Injections - Location of Myofascial Pain - Bilateral cervical paraspinals, trapezius, and suprascapularis      Injection # 1    Postprocedural Diagnosis: Same  Needle Type: - Straight Needle -  25 gauge, 1.5 inch    Solution injected: Lidocaine 1%  Volume Injected for each Trigger Point: 1 ml  Number of Trigger Point Injected: 16    Additional Medications Administered: none  Estimated Blood Loss - <2 ml  Drains: None  Specimens Removed: None  Urine Output - Not Measured  Complications: no apparent complications  Outcome: good  VAS Before Injection:  8/10  VAS After Injection:  4/10    Informed Consent:  The patient's condition and proposed procedures, risks, and alternatives were discussed with the patient or responsible party.  The patient's/responsible party's questions were answered.  The patient/responsible party appeared to understand and chose to proceed.  Informed consent was obtained.    Procedure in Detail:  The procedure was performed in the procedure treatment room without ultrasound.    After obtaining written consent, the patient was placed in a seated position. By palpation  the above noted number of trigger points were identified that reproduced the patient's typical radiating pain pattern. The trigger points were located in the above noted muscles. Each of the target sites of injection was prepped using an antiseptic solution.     Procedural Pause:  A procedural pause verifying correct patient, medical record number, allergies, medications to be administered, current vital signs, and surgical site was performed immediately prior to beginning the procedure.    The above noted needle was advanced towards each trigger point without ultrasound until the patient's typical radiating pain pattern was reproduced. After negative aspiration for heme, the above noted solution was injected in a fanned-out distribution at each trigger point. The needle(s) was then removed.     The  heart rate, pulse oximetry, and blood pressure were continuously monitored throughout the procedure (see Nursing Flowsheet for details).  The patient tolerated the procedure well.. Patient was monitored by RN for recovery period in the treatment room. After meeting discharge criteria, the patient was discharged with driver.       DISCUSSION:  Trigger point injections were performed today to treat the patient's myofascial pain. The purpose was to improve the patient's function and decrease pain. We have reminded the patient that the trigger point injections must be done in conjunction with a stretching program.  Without a stretching program, results from trigger point injections are often suboptimal.     The patient was advised to relax and avoid any heavy lifting or excessive bending for 24 hours.   He was advised that he may return to his usual activities after 24 hours if he is otherwise feeling well.    The patient was advised not to bathe or soak in water for 24 hours but that showering would be acceptable.    The patient was instructed that if he experienced fever or chills, new weakness, new sensory changes, any changes in bowel or bladder habits, worsening pain, or other new symptoms, that he should contact the pain clinic immediately or dial 911 if unable to reach the pain clinic.          PLAN:  1. We will plan to have the patient follow up in 4 weeks for an evaluation of the efficacy of today's procedure..  2. No medications were prescribed at today's visit.  3. We recommended to the patient that he  not travel out of the area for the next 4 days following the procedure so that he can be reevaluated if necessary.  4. Additional recommendations - None       Informed Consent:  The patient's condition and proposed procedures, risks, and alternatives were discussed with the patient or responsible party.  The patient's / responsible party's questions were answered.   The patient / responsible party appeared to  understand and chose to proceed.  Informed consent was obtained.  Procedural Pause:  A procedural pause verifying correct patient, medical record number, allergies, and surgical site was performed immediately prior to beginning the procedure.  The patient was instructed and educated on all aspects of the plan of care.  The patient acknowledged the plan of care.  I saw and evaluated the patient with my attending physician, Dr. Melburn Popper, DO  Fellow   Department of Anesthesiology & Pain Medicine    Note Electronically Signed By:  Kerri Perches, DO

## 2019-06-22 MED FILL — lidocaine (PF) 10 mg/mL (1 %) injection solution: INTRAMUSCULAR | Qty: 30 | Status: AC

## 2019-07-20 NOTE — Progress Notes (Addendum)
VIDEO Visit Note     This clinical encounter was performed by utilizing a real time VIDEO  connection between my location and Samuel Cobb's location. Mr. Bala location was confirmed during this visit. Mr. Corado was advised that if he does not use a Wi-Fi connection for today's visit, then he is responsible for any carrier charges for standard message rates and data charges. I obtained verbal consent from Mr. Cabana to perform this clinical encounter utilizing the above technological connection and prepared Mr. Gautney by answering any questions he had about the tele-health interaction.       [facility]  Department of Anesthesiology and Pain Medicine  [address]  [place], [place] [address]  Phone: [phone]    Fax: [phone]    Date: 07/22/2019    Re: Samuel Cobb  MR#: [id number]  Date of Birth: November 02, 1985  Age: 12yr    Requesting physician: Lyda Kalata, MD    Below is the summary of the visit with our mutual patient, Samuel Cobb. Thank you for your cooperation in his care.          Assessment and Differential Diagnosis:    ICD-10-CM    1. Cervical stenosis of spinal canal  M48.02 PHYSICAL THERAPY REFERRAL     SPINE PROGRAM REFERRAL   2. Cervical radiculopathy  M54.12 PHYSICAL THERAPY REFERRAL     SPINE PROGRAM REFERRAL   3. Cervical radiculitis  M54.12 PHYSICAL THERAPY REFERRAL     SPINE PROGRAM REFERRAL        Orders Placed This Encounter    PHYSICAL THERAPY REFERRAL    SPINE PROGRAM REFERRAL    folic acid 800 mcg Tablet             Encounter Summary:  Samuel Cobb is a 34yr -old male who  has a past medical history of Anxiety and Diverticulitis. AND  has a past surgical history that includes tonsillectomy (1991); pr removal gallbladder (2011); and exploratory laparotomy (2015)..    Mr. Pollack was seen today in telemedicine follow-up at the [facility] for neck and bilateral upper extremity pain.    He underwent a Trigger Point  Injections - Location of Myofascial Pain - Bilateral cervical paraspinals, trapezius, and suprascapularis with [place] on 06/21/19 using a 25 gauge, 1.5 inch needle without pain relief. He states that his arms are getting more numb since his injection. When he wakes up in the morning, both his arms are numb and lasts for 20 min. He states that the numbness on his left arm travels posteriorly down to his 5th digit. He currently takes buprenoprhine 2mg  (1-2 tablets per day) and usually takes one valium before work. He no longer takes flexeril or nonsteroidal anti-inflammatory drugs.     He saw Dr. and his team on 04/2019 who recommend the following:  "We recommend the following:  -Referred to pain medicine specialist for neck pain injections (TPI vs facets)  -Pursue EMG/NCS, recommends bilateral upper extremity EMG/NCS.  -Follow-up after completion of above. Patient will call 05/2019 for appointment  Follow up: Return if symptoms worsen or fail to improve, for After workup with Pain Medicine and completion of diagnostics, Pt will call us for appointment."      MRI C-spine  IMPRESSION:  1. 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  2. Broad-based disc bulges at C5-6 and C6-7 which harbor annular tears.    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 On physical exam, patient has positive right Spurling's.    Socially, he works at [employer] and his job involves lifting.    Impression: Samuel Cobb has cervical radiculitis and radiculopathy concordant with his imaging. He states that his numbness is getting worse after the trigger point injection in 06/2019. The length of the needle used is very unlikely to cause neuropathy. His worsening numbness can be attributed to his cervical stenosis. We have placed a re-referral for him to see Dr. Omar PersonEbinu since his symptoms are progressively worse to evaluate for surgical options. If Dr.  Omar PersonEbinu would like us to trial a cervical epidural steroid injection, we can offer him that injection. We also placed a referral for him to see physical therapy to focus on traction exercises and if this provides him with pain relief, we recommend he get a home traction device.        RECOMMENDATIONS/TREATMENT PLAN:   1. Cervical stenosis of spinal canal  - SPINE PROGRAM REFERRAL: Patient failed trigger point injection and appears to have a worsening cervical radiculopathy.  If Dr. Omar PersonEbinu does not recommend surgery at this time and would like us to perform a cervical epidural steroid injection, we can offer him that injection.   - PHYSICAL THERAPY REFERRAL: focus on traction exercises and if this provides him with pain relief, we recommend he get a home traction device.     2. Cervical radiculopathy  - as per #1    3. Cervical radiculitis  - as per #1      DISPOSITION AFTER TODAY'S VISIT:   Patient will see Dr. Omar PersonEbinu, follow up as needed with PK         Dear Dr. Janalyn Cobb, Samuel Hansenhristina Lynn, MD    It was a pleasure to see your patient, Samuel Cobb, today in telemedicine follow-up  [facility].  As you know, Samuel Cobb is a [age]yr -old male with a chief complaint of:    Chief Complaint   Patient presents with    Arm Pain       Samuel Cobb is here for arm pain, which started 1+ year(s) ago.       ALLERGIES:  Allergies   Allergen Reactions    Regal-Am [Quinine Sulfate] Other-Reaction in Comments     shake    Toradol [Ketorolac Tromethamine] Other-Reaction in Comments     shake    Tramadol Other-Reaction in Comments     shake    Zoloft [Sertraline Hcl] Palpitations       CURRENT MEDICATIONS:     Outpatient Medications Marked as Taking for the 07/21/19 encounter (Telemedicine Scheduled) with Carmelina DaneKreis, Paul Gregory, MD   Medication Sig Dispense Refill    Buprenorphine (SUBUTEX) 2 mg Sublingual Tablet 1 TABLET UNDER  THE TONGUE AND ALLOW TO DISSOLVE EVERY 12 HRS SUBLINGUAL 30 DAYS      Diazepam (VALIUM) 2 mg Tablet TAKE0.5  1 TABLET BY ORAL ROUTE 2 TIMES EVERY DAY AS NEEDED      folic acid 800 mcg Tablet       Promethazine (PHENERGAN) 25 mg Tablet Take 25 mg by mouth once daily if needed.         Past Medical History:   Diagnosis Date    Anxiety     Diverticulitis        Past Surgical History:   Procedure Laterality Date    EXPLORATORY LAPAROTOMY  2015  PR REMOVAL GALLBLADDER  2011    TONSILLECTOMY  1991       Family History   Problem Relation Name Age of Onset    Other (GI abnormality) Mother           PSYCHOSOCIAL HISTORY:     Social History     Socioeconomic History    Marital status: UNKNOWN     Spouse name: Not on file    Number of children: Not on file    Years of education: Not on file    Highest education level: Not on file   Occupational History    Not on file   Social Needs    Financial resource strain: Not on file    Food insecurity     Worry: Not on file     Inability: Not on file    Transportation needs     Medical: Not on file     Non-medical: Not on file   Tobacco Use    Smoking status: Never Smoker    Smokeless tobacco: Never Used   Substance and Sexual Activity    Alcohol use: Not Currently     Frequency: Monthly or less     Comment: stopped after gallbladder surgery    Drug use: Yes     Frequency: 3.0 times per week     Types: Marijuana    Sexual activity: Not on file   Lifestyle    Physical activity     Days per week: Not on file     Minutes per session: Not on file    Stress: Not on file   Relationships    Social connections     Talks on phone: Not on file     Gets together: Not on file     Attends religious service: Not on file     Active member of club or organization: Not on file     Attends meetings of clubs or organizations: Not on file     Relationship status: Not on file    Intimate partner violence     Fear of current or ex partner: Not on file     Emotionally abused: Not  on file     Physically abused: Not on file     Forced sexual activity: Not on file   Other Topics Concern    Not on file   Social History Narrative    Not on file     The patient  reports that he has never smoked. He has never used smokeless tobacco.    PROBLEM LIST:  Patient Active Problem List   Diagnosis    Chronic abdominal pain    Anxiety    Degeneration of cervical intervertebral disc     OPIOID MEDICATIONS  The patient was asked if they are taking opioid medications such as hydrocodone (Vicodin, Norco, Lortab, Zohydro), hydromorphone (Dilaudid, Exalgo), fentanyl (Duragesic, Actiq, Fentora, Subsys), morphine (MS Contin, Kadian, Avinza), oxycodone (percocet, roxycodone, Oxycontin) Methadone, Buprenorphine (Suboxone, Subutex, BuTrans) Oxymorphone (Opana), codeine (Tylenol #3 or #4), Tramadol (Ultram), Tapentadol (Nucynta)     The patient answers  Yes to current opioid use.    TIMING OF PAIN  The patient states that pain is present  frequently (75% of the time)                                  PAIN QUALITY  The patient describes the pain  as  burning,pins and needles,sharp,numbness,shooting,throbbing,pressure    PAIN INTENSITY - Visual Analog Scale (VAS): 0-10 Scale (0 = No Pain, 10 = Worst Imaginable Pain)   Currently the patient has a VAS of  8/10.   On average the pain score for the last week has been a VAS of  8 /10.   At its best, the VAS pain score is  6 /10.   At its worst, the VAS pain score is  9 /10.    RELIEVING AND AGGRAVATING FACTORS  The patient was asked if any of the following activities either alleviate or exacerbate their pain: lying down, standing, sitting walking, exercise, medications, relaxation, thinking about something else, coughing/sneezing, urination, or bowel movements  The patient states that the pain:  is relieved by:    lying down  medications  relaxation   thinking about something else    is aggravated by:    standing  sitting  walking  exercise (if  applicable)  coughing/sneezing   bowel movements    FUNCTIONAL ASSESSMENT (Pain Disability Index):   Measures the level of disability related to pain: 0-10 Scale (0= No Disability, 2=Minimal, 5=Moderate, 8=Severe, 10= Total Disability)    ACC Pain Assessment     PAIN FUNCTIONAL ASSESSMENT [place] 07/21/2019    Family and Home 5    Recreation 7    Social 7    Occupation 7    Sexual Behavior 6    Self Care 5    Life Support 5    [place] 42            REVIEW OF SYSTEMS:  The patient was asked to review the list below and report if they were currently experiencing any of the symptoms:     Constitutional   o  fever or chills   o  unplanned weight loss   Eyes   o  double or blurred vision    ENT   o  hearing loss   o  Difficulty swallowing   Hematologic/Lymphatic   o  bleeding gums   o  low platelet count   Endocrine   o  Heat intolerance   o cold intolerance   o thyroid problems   Integumentary   o skin rash   Respiratory   o shortness of breath   o wheezing   Cardiac   o palpitations ( awareness of fast heart)   o chest pain   Gastrointestinal   o constipation   o abdominal pain   o nausea   o vomiting   o diarrhea   Genito-urinary   o sexual dysfunction   o urinary retention or difficulty urinating   Musculoskeletal   o back pain   o neck pain   o joint pain   o muscle pain   Neurological   o loss of consciousness or blackouts   o memory loss   o muscle weakness   o seizures   o trouble walking   o dizziness   o drowsiness   o excessive fatigue   Behavioral   o difficulty falling or remaining asleep   o loss of interest in hobbies or other activities   o difficulty concentrating   o feelings of guilt   o feeling depressed      THE PATIENT ADMITS TO THE FOLLOWING OF THE ABOVE LISTED SYMPTOMS:      palpitations(awareness of fast heart)  chest pain  abdominal pain  nausea  vomiting  back pain  neck pain  joint pain (knee, elbow, hip etc.)  muscle pain  muscle weakness  trouble walking  dizziness  excessive  fatigue  difficulty falling or remaining asleep  loss of interest in hobbies or other activites    All other systems were negative.    The Review of Systems was reviewed.    PAST MEDICAL, FAMILY, SOCIAL HISTORY:   Family Life: Living Circumstances:  The patient currently lives  with an adult companion    PAIN TREATMENTS:   Previous Injection Procedures: Please refer to HPI.     PREVIOUS DIAGNOSTIC STUDIES:   Images: The report of the image indicated below was reviewed. and The image noted below was viewed.   MRI of cervical spine       Labs: The lab/study results below were reviewed.    No results found for: WBC, HGB, HCT, PLT    No results found for: NA, K, CL, CO2, BUN, CR, GLU    No results found for: PGLU      No results found for: AST, ALT, ALP, ALB, TP, TBIL    No results found for: HGBA1C    No results found for: A1CPOC    No results found for: INR    No results found for: INR    No results found for: BNP    No components found for: TROPONIN1      Urine Toxicology  No results found for: DRUGGCSCRNU  No results found for: AMPHETSCRNU  No results found for: BENZOSSCRNU  No results found for: COCAINESCRNU  No results found for: BARBSSCRNU      PHYSICAL EXAM:  Constitutional: Normally developed, no apparent distress, well groomed.  Head: atraumatic, normocephalic   Eyes: anicteric sclera  Neck: trachea midline   ENT: no nasal discharge   Respiratory: No increased work of breathing   Psyc: normal affect  C-spine: positive right Spurling's.    ASSESSMENT AND RECOMMENDATIONS/TREATMENT PLAN:   Please see the beginning of the note.       - The patient was instructed and educated on all aspects of the plan of care.  The patient acknowledged the plan of care.  - I saw and evaluated the patient with my attending physician, Dr. Charlyne Quale.      Lennie Odor, MD  Fellow  Department of Anesthesiology & Pain Medicine      Mr. Stann: If you are reviewing this progress note and have questions about the meaning or medical  terms being used, please schedule an appointment or bring it up at your next follow-up appointment.  Medical notes are a communication tool between medical professionals and require medical terms to be used for efficiency. You can also look up terms via on-line medical dictionaries such as Medline Plus at [employer]ments.it.html

## 2019-07-21 ENCOUNTER — Encounter: Payer: Self-pay | Admitting: ANESTHESIOLOGISTS

## 2019-07-21 ENCOUNTER — Ambulatory Visit: Payer: Medicaid (Managed Care) | Admitting: ANESTHESIOLOGISTS

## 2019-07-21 DIAGNOSIS — M5412 Radiculopathy, cervical region: Secondary | ICD-10-CM

## 2019-07-21 DIAGNOSIS — M4802 Spinal stenosis, cervical region: Secondary | ICD-10-CM

## 2019-07-22 NOTE — Progress Notes (Signed)
Video Visit Note     I performed this clinical encounter by utilizing a real time telehealth video connection between my location and the patient's location. The patient's location was confirmed during this visit. I obtained verbal consent from the patient to perform this clinical encounter utilizing video and prepared the patient by answering any questions they had about the telehealth interaction.    - The patient was evaluated and care plan was developed with the pain fellow, Dr. Arlester Marker, on the date indicated in the fellow's note. I agree with the findings and plan as outlined in the pain fellow's note.  - Greater than 50% of the 15 minutes with the patient was spent counseling regarding instructions for management and/or follow-up and patient and family education  - The patient was instructed and educated on all aspects of the plan of care. The patient acknowledged the plan of care.  Karmen Bongo, MD  Department of Anesthesiology & Pain Medicine  Attending

## 2019-08-08 NOTE — Progress Notes (Signed)
Attending addendum:     This patient was seen, evaluated and care plan was developed with the APP. I agree with the findings and the plan as outlined in the APP's note.      I personally reviewed the available diagnostic studies with the patient. Based on the history including any treatment already tried and examination with relevant findings on the images/diagnostic studies, I spent 30 minutes of which greater than 50% of the visit time was spent counseling the patient on their current clinical status, pertinent diagnostic/radiographical findings, management plan, potential short-term and long-term risks involved, and necessary precautions needed to be taken by the patient at length. The patient voiced and demonstrated full understanding of and agreement with the plan of care.       Tamaria Dunleavy O. Bethania Schlotzhauer MD, PhD, FRCSC, FACS  Attending Neurosurgery    *Voice recognition software was used for portions of this note.  Minor typos or word selection errors may not have been noted/corrected despite my personal proof reading/review*

## 2019-09-13 ENCOUNTER — Encounter: Payer: Self-pay | Admitting: SURGERY/NEUROLOGIC

## 2019-09-13 ENCOUNTER — Ambulatory Visit
Admission: RE | Admit: 2019-09-13 | Discharge: 2019-09-13 | Disposition: A | Discharge status: Home / Self Care | Payer: Medicaid (Managed Care) | Source: Ambulatory Visit | Attending: SURGERY/NEUROLOGIC | Admitting: SURGERY/NEUROLOGIC

## 2019-09-13 ENCOUNTER — Ambulatory Visit (HOSPITAL_BASED_OUTPATIENT_CLINIC_OR_DEPARTMENT_OTHER): Payer: Medicaid (Managed Care) | Admitting: SURGERY/NEUROLOGIC

## 2019-09-13 VITALS — BP 112/74 | HR 88 | Temp 98.6°F | Resp 16 | Ht 67.0 in | Wt 154.3 lb

## 2019-09-13 DIAGNOSIS — M542 Cervicalgia: Secondary | ICD-10-CM

## 2019-09-13 DIAGNOSIS — R202 Paresthesia of skin: Secondary | ICD-10-CM

## 2019-09-13 DIAGNOSIS — M25511 Pain in right shoulder: Secondary | ICD-10-CM

## 2019-09-13 DIAGNOSIS — M4802 Spinal stenosis, cervical region: Secondary | ICD-10-CM

## 2019-09-13 NOTE — Nursing Note (Signed)
Identified pt using Name and Date Of Birth.Vital signs taken, screened for pain, screen for mobility. Allergies verified. Pharmacy updated. Vear [facility] MA II.    Patient mask status: Patient was wearing a mask.     Droplet precautions were followed when caring for the patient.   PPE used by providers during encounter: Surgical masks/ face shields worn.
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
## 2019-09-14 NOTE — Progress Notes (Signed)
Attending addendum:    I personally saw the patient and reviewed all relevant history and physical examination with the Resident.  I went over the available imaging and diagnostic studies with the patient.  The recommended plan is based on any treatment already tried and the review of the history and physical examination with relevant findings on the images/diagnostic studies.      Thank you for involving me with this patient's care.      I spent 30 minutes of which greater than 50% of the visit time was spent counseling the patient on their current clinical status, pertinent diagnostic/radiographical findings, management plan, potential short-term and long-term risks involved, and necessary precautions needed to be taken by the patient at length. The patient voiced and demonstrated full understanding of and agreement with the plan of care.       Evangelene Vora O. Kharma Sampsel MD, PhD, FRCSC, FACS  Attending Neurosurgery    *Voice recognition software was used for portions of this note.  Minor typos or word selection errors may not have been noted/corrected despite my personal proof reading/review*

## 2019-09-27 ENCOUNTER — Telehealth: Payer: Self-pay | Admitting: PAIN MANAGEMENT

## 2019-09-27 NOTE — Telephone Encounter (Signed)
Patient asking for follow up with Dr. Liane Comber. He is a Dr. Charlyne Quale patient. Last seen for follow up by Dr. Charlyne Quale on 07/21/19 (saw Dr. Charlyne Quale for consult on 05/24/19). Recommendations were:    RECOMMENDATIONS/TREATMENT PLAN:   1. Cervical stenosis of spinal canal  - SPINE PROGRAM REFERRAL: Patient failed trigger point injection and appears to have a worsening cervical radiculopathy.  If Dr. Omar Person does not recommend surgery at this time and would like [place] to perform a cervical epidural steroid injection, we can offer him that injection.   - PHYSICAL THERAPY REFERRAL: focus on traction exercises and if this provides him with pain relief, we recommend he get a home traction device.     2. Cervical radiculopathy  - as per #1    3. Cervical radiculitis  - as per #1      DISPOSITION AFTER TODAY'S VISIT:   Patient will see Dr. Omar Person, follow up as needed with PK    Patient saw Dr. Omar Person on 09/13/19 for follow up. Recommendations were:    Recommendations:  1) Shoulder xrays  2) Additional shoulder imaging as needed  3) Physical therapy for shoulder pain  4) Pain management referral if PT doesn't help    Dr. Charlyne Quale and Dr. Liane Comber: please advise, ok to schedule follow up and with who?    Message reviewed, determined further review/action is needed from Physician.

## 2019-09-27 NOTE — Telephone Encounter (Signed)
Dear Dr. Liane Comber:     Reason for Call: Patient last seen 1/14 for  f/v w/ PK. Last notes state Patient will see Dr. Omar Person. Awaiting recommendations from Dr. Omar Person.     Patient sent in the following appointment request    Appointment Request From: Samuel Cobb    With Provider: Merideth Abbey, MD [Pain Clinic]    Preferred Date Range: 09/20/2019 - 10/25/2019    Preferred Times: Any Time    Reason for visit: Office Visit    Comments:  Pain is becoming unbearable. Need treatment plan    May patient schedule f/v w/ GM?    Action Requested: Please advise.    Thank you,  [name]  [facility]

## 2019-09-28 NOTE — Telephone Encounter (Signed)
In-person follow up with Navjot Deol, NP (ideally on a day that Dr. Luis Abed is in clinic).       SHOULDER 3+ VIEWS RIGHT  EXAM DATE: 09/13/2019 4:06 PM  COMPARISON: None    INDICATION: Signs/Symptoms:  Right shoulder pain    TECHNIQUE: AP, Grashey, axillary view of the right shoulder    FINDINGS:  No acute fracture or dislocation. Shoulder alignment is intact. Joint  spaces are maintained. Visualized lung fields and soft tissues are  unremarkable.        IMPRESSION:  1. No acute osseous abnormality.        MR C-SPINE WITH / WITHOUT CONTRAST  EXAM DATE:  12/31/2018 8:56 AM.  COMPARISON: None available    INDICATION: Tachycardia (HR more than 100 bpm); Extremity paresthesias;    TECHNIQUE: Routine spine without protocol. Precontrast sagittal T1, T2 FSE,  STIR, axial T1 and T2.    FINDINGS:    Alignment: The alignment of the cervical vertebrae is slightly distorted  with accentuation of the normal lordosis in the upper cervical region and  loss of the normal lordosis around C6. In addition, there is trace  retrolisthesis of C5 on C6.    Vertebral body heights and marrow: Vertebral body height is maintained.  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    Spinal Cord:The craniocervical junction is unremarkable. There is no  absolute evidence of myelopathic changes within the cord.    Soft tissues: Normal.     At C2-3: Mild uncovertebral hypertrophy is present with no effect on the  neural structures.    At C3-4:The canal and foramina are patent.    At C4-5:Global disc bulging, uncovertebral hypertrophy contribute to mild  flattening of the ventral thecal sac without spinal stenosis. There is no  significant foraminal narrowing.    At C5-6:A broad-based disc bulge is present which is slightly lateralized  to the right. An annular tear is present posteriorly, which could be pain  generating. There is associated uncovertebral and laminar  hypertrophy,  resulting in mild posterior displacement of the thecal sac. There is mild  left foraminal narrowing and moderate right foraminal narrowing.    At C6-7:A global disc bulges present with an associated annular tear. There  is significant uncovertebral hypertrophy. There is moderate spinal stenosis  and moderately severe bilateral foraminal narrowing at this level.    At C7-T1:The canal and foramina are normally patent.    IMPRESSION:    1. 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  2. Broad-based disc bulges at C5-6 and C6-7 which harbor annular tears.         EMG / Nerve Conduction Study  Visit Date:          05/12/2019 12:42  Age:                    34 Years 5 Months Old    Patient History:34 year-old man presenting with chronic neck pain for more than 15 years and paresthesia in RUE for 3 years. He developed neck pain as a teenager without trauma. It slowly worsened with limited range of motion. He started to have paresthesia with pain shooting from neck to right UE a few years ago and complained Lhermitt's sign.MRI of C spine did not report cord compression. He complained numbness in left little finger and thought his left hand was weaker. He had left hand injury affecting the ring  finger before. On focused neurological exam: CN II-XII intact. Normal muscle bulk and tone. Muscle power is 5/5. Pinprick sensation slightly decreased in the left little finger. Normal temperature and vibration. DTRs: 2+ throughout. Tinel's sign (+) in left elbow. Query: Cervical radiculopathy vs CTS vs ulnar neuropathy ?          ELECTRODIAGNOSTIC FINDINGS:     1. Normal sensory nerve conduction studies in bilateral median, ulnar and right radial nerves  2. Normal motor nerve conduction in bilateral median and ulnar nerves. The difference of onset latencies is borderline prolonged in left ulnar inching study of the  segment between elbow and 2.5 cm distal to elbow  3. Needle EMG was performed in selective muscles of bilateral upper extremities that were within normal range except slightly decreased recruitment of left FCU muscle    CLINICAL IMPRESSION:  Borderline prolonged difference of onset latencies in left ulnar nerve between elbow and 2/5 com distal to the elbow. 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     Comments:  Occupational therapy was ordered. The patient was instructed to avoid elbow compression and complete occupational therapy.

## 2019-09-29 NOTE — Telephone Encounter (Signed)
Sent a my-chart message to patient requesting he call back and schedule In-person follow up with Navjot Deol, NP (ideally on a day that Dr. Charlyne Quale is in clinic).     Sent to pain clinic scheduling.    Thank you,   Jacqulyn Liner  [facility]   [facility]  [phone]

## 2019-10-04 NOTE — Telephone Encounter (Signed)
Patient scheduled for IN PERSON FV w/Nav on 10/25/19.    Thank you,  Randal Buba  [facility]  [facility]   [phone]

## 2019-10-25 ENCOUNTER — Ambulatory Visit: Payer: Medicaid (Managed Care) | Admitting: NURSE PRACTITIONER

## 2019-11-07 ENCOUNTER — Ambulatory Visit: Payer: Medicaid (Managed Care)

## 2019-11-07 ENCOUNTER — Encounter: Payer: Self-pay | Admitting: NURSE PRACTITIONER

## 2019-11-07 ENCOUNTER — Ambulatory Visit: Payer: Medicaid (Managed Care) | Attending: NURSE PRACTITIONER | Admitting: NURSE PRACTITIONER

## 2019-11-07 VITALS — BP 111/72 | HR 77 | Temp 98.0°F | Resp 16 | Ht 67.0 in | Wt 145.3 lb

## 2019-11-07 DIAGNOSIS — Z01812 Encounter for preprocedural laboratory examination: Secondary | ICD-10-CM

## 2019-11-07 DIAGNOSIS — F1120 Opioid dependence, uncomplicated: Secondary | ICD-10-CM | POA: Insufficient documentation

## 2019-11-07 DIAGNOSIS — M7918 Myalgia, other site: Secondary | ICD-10-CM | POA: Insufficient documentation

## 2019-11-07 DIAGNOSIS — M5412 Radiculopathy, cervical region: Secondary | ICD-10-CM | POA: Insufficient documentation

## 2019-11-07 DIAGNOSIS — M47812 Spondylosis without myelopathy or radiculopathy, cervical region: Secondary | ICD-10-CM | POA: Insufficient documentation

## 2019-11-07 DIAGNOSIS — M7541 Impingement syndrome of right shoulder: Secondary | ICD-10-CM | POA: Insufficient documentation

## 2019-11-07 DIAGNOSIS — F1190 Opioid use, unspecified, uncomplicated: Secondary | ICD-10-CM | POA: Insufficient documentation

## 2019-11-07 DIAGNOSIS — M25511 Pain in right shoulder: Secondary | ICD-10-CM | POA: Insufficient documentation

## 2019-11-07 LAB — CBC WITH DIFFERENTIAL
Basophils % Auto: 0.4 %
Basophils Abs Auto: 0 10*3/uL (ref 0.0–0.2)
Eosinophils % Auto: 2.2 %
Eosinophils Abs Auto: 0.2 10*3/uL (ref 0.0–0.5)
Hematocrit: 42.9 % (ref 40.0–52.0)
Hemoglobin: 14.3 g/dL (ref 14.0–18.0)
Lymphocytes % Auto: 22.5 %
Lymphocytes Abs Auto: 2.2 10*3/uL (ref 1.0–4.8)
MCH: 29.5 pg (ref 27.0–33.0)
MCHC: 33.3 % (ref 32.0–36.0)
MCV: 88.6 fL (ref 80.0–100.0)
MPV: 8.7 fL (ref 6.8–10.0)
Monocytes % Auto: 6.5 %
Monocytes Abs Auto: 0.6 10*3/uL (ref 0.1–0.8)
Neutrophils % Auto: 68.4 %
Neutrophils Abs Auto: 6.6 10*3/uL (ref 1.8–7.7)
Platelet Count: 228 10*3/uL (ref 130–400)
RDW: 14.4 % (ref 0.0–14.7)
Red Blood Cell Count: 4.85 10*6/uL (ref 4.50–5.90)
White Blood Cell Count: 9.6 10*3/uL (ref 4.5–11.0)

## 2019-11-07 LAB — COMPREHENSIVE METABOLIC PANEL
Alanine Transferase (ALT): 16 U/L (ref 6–63)
Albumin: 4.6 g/dL (ref 3.4–4.8)
Alkaline Phosphatase (ALP): 58 U/L (ref 35–115)
Aspartate Transaminase (AST): 22 U/L (ref 15–43)
Bilirubin Total: 0.5 mg/dL (ref 0.3–1.3)
Calcium: 9.3 mg/dL (ref 8.6–10.5)
Carbon Dioxide Total: 25 mmol/L (ref 24–32)
Chloride: 99 mmol/L (ref 95–110)
Creatinine Serum: 0.79 mg/dL (ref 0.44–1.27)
E-GFR Creatinine (Male): >100 mL/min/{1.73_m2}
E-GFR, African American (Male): >100 mL/min/{1.73_m2}
Glucose: 70 mg/dL (ref 70–99)
Potassium: 4 mmol/L (ref 3.3–5.0)
Protein: 7.7 g/dL (ref 6.3–8.3)
Sodium: 135 mmol/L (ref 135–145)
Urea Nitrogen, Blood (BUN): 7 mg/dL — ABNORMAL LOW (ref 8–22)

## 2019-11-07 MED ORDER — NALOXONE 4 MG/ACTUATION NASAL SPRAY
NASAL | 1 refills | Status: AC
Start: 2019-11-07 — End: 2020-11-01

## 2019-11-07 NOTE — Nursing Note (Signed)
Patient name and DOB verified. Patient roomed and vital signs obtained. Medication allergies and pain level verified. Tobacco use history reviewed.     Patient WAS wearing a surgical mask  Droplet precautions were followed when caring for the patient.   PPE used by provider during encounter: Surgical mask, Face Shield/Goggles and Gloves

## 2019-11-07 NOTE — Progress Notes (Signed)
Patient WAS wearing a surgical mask  Contact and Droplet precautions were followed when caring for the patient.   PPE used by provider during encounter: Surgical mask, Face Shield/Goggles and Gloves  ===================================================     [facility]  Department of Anesthesiology and Pain Medicine  [address], [address]  [place], [place] [address]  Phone: [phone]    Fax [phone]    Date: 11/10/2019    Re: Samuel Cobb  MR#: [id number]  Date of Birth: 1986-02-01  Age: 67yr    Requesting physician: Lyda Kalata, MD    Below is the summary of the visit with our mutual patient, Samuel Cobb. Thank you for your cooperation in his care.       Assessment and Differential Diagnosis:    ICD-10-CM    1. Cervical radiculitis  M54.12 Physical Therapy     MR C-SPINE WITHOUT CONTRAST   2. Cervical spondylosis  M47.812 Physical Therapy     MR C-SPINE WITHOUT CONTRAST   3. Rotator cuff impingement syndrome of right shoulder  M75.41 Physical Therapy   4. Myofascial muscle pain  M79.18 Physical Therapy   5. Chronic, continuous use of opioids  F11.90 Naloxone (NARCAN) 4 mg/actuation Nasal Spray   6. Pre-procedure lab exam  Z01.812 CBC with Differential     Comprehensive Metabolic Panel        Orders Placed This Encounter    MR C-SPINE WITHOUT CONTRAST    CBC with Differential    Comprehensive Metabolic Panel    Physical Therapy    Naloxone (NARCAN) 4 mg/actuation Nasal Spray       Encounter Summary:  Samuel Cobb is a 34yr -old male who  has a past medical history of Anxiety and Diverticulitis. AND  has a past surgical history that includes tonsillectomy (1991); pr removal gallbladder (2011); and exploratory laparotomy (2015).     Samuel Cobb was seen today in follow up at the [facility] for neck and shoulder pain. Patient reports history of neck pain, worse on the right side ongoing since age 64.  He reports that he woke up with pain without any injuries. Overtime,  his symptoms progressed and he started to have bilateral arm symptoms over the past 5 years or more. He reports having shooting pains down bilateral arms, worse on the right side with tingling in his finger tips, mainly the last two fingers more predominately on the right side. He reports hist of right ring finger fracture and has a deformed finger with residual numbness. Reports his neck is stiff at times. Overall, he feels his neck pain is 80% of his pain, mainly on the right side. He underwent  trigger point injections on 06/21/19 without benefit. However, patient feels his symptoms have been worse since the  trigger point injections and now he has been having more numbness in his arms when he wakes up in the morning . Reports whenever he leans his neck towards either side, it causes numbness down his arms.  Patient reports that he cannot get comfortable and the pain affects his function and quality of life.    He has done physical therapy over the years, last month about year ago without long term benefit. He is waiting to restart physical therapy. He has not tried acupuncture.     For pain, he takes Buprenorphine  BID. On Valium PRN for anxeity.     Seen by Dr. Omar Person 09/13/19:   Impression and Plan:  Samuel Needle  Cobb is a 34yr old male with cervicalgia and right shoulder pain for 10+ years.  Patient states that his pain has been getting slightly worse with no numbness and tingling in the morning after trigger point injections.  His MRI shows some cervical stenosis at C5-7 this would not likely cause the symptoms that he is experiencing.  We therefore recommend x-rays of his shoulder and then follow-up with physical therapy for shoulder strengthening.  Should this not work we recommend potential referral for pain management for further investigation of his shoulder pain.    Recommendations:  1) Shoulder xrays  2) Additional shoulder imaging as needed  3) Physical therapy for shoulder pain  4) Pain management  referral if PT doesn't help    On exam, patient cervical range of motion did cause his cervical pain. Spurling's manuever on the right caused increased pain located right trapezius and upper arm.  He also had myofascial tenderness with trigger points.  His right shoulder exam was consistent with rotator cuff impingement.  However, patient appears to have 2 separate pathologies.  One being his neck with radicular pain and the second rotator cuff impingement.  His neck pain and cervical radicular symptoms appear to be more of his concern. Patient is interested in the procedure, however given worsening of his neck and arm numbness, will get a updated cervical MRI prior to any procedures. Patient was advised to establish care with one of our physicians to follow-up with him after the MRI is Dr. Charlyne Quale will be retiring soon.    His cervical MRI done on 12/31/2018 reveale Samuel Cobb  Relatively severe spondylosis at C6-7 with moderate spinal stenosis and moderately severe bilateral foraminal narrowing. 2. Broad-based disc bulges at C5-6 and C6-7 which harbor annular tears.    We will also refer him to physical therapy to work on his shoulder and neck.    Discussed conservative treatment options. Self-management: activity modification to avoid aggravating activities, attention to good posture/ergonomics/body biomechanics and adherence to regular stretching and strengthening exercises.     RECOMMENDATIONS/TREATMENT PLAN:   1. Cervical radiculitis  -11/10/19 I called patient and notified that my note, I will go ahead and get an updated cervical MRI as his neck and arm symptoms have worsened than before.  He is advised to establish care with one of the physicians to see them in clinic after the MRI is Dr. Charlyne Quale is retiring.  Patient verbalized understanding.  - Physical Therapy    2. Cervical spondylosis  -See above  - Physical Therapy    3. Rotator cuff impingement syndrome of right shoulder  - Physical Therapy    4. Myofascial  muscle pain  -Failed  trigger point injections and his pain has been worse since the last trigger point injections. Please see above.  - Physical Therapy    5. Chronic, continuous use of opioids  -We discussed side effects of opioid medications with special regard to sedation, tolerance, addiction, dependence, hyperalgesia, medication interaction, and death.  We discussed the signs of overdose.     - Naloxone (NARCAN) 4 mg/actuation Nasal Spray; Use as needed for suspected opioid overdose. Call 911. Spray into one nostril upon   signs of opioid overdose.  Additional doses may be given every 2 to 3 minutes until emergency medical assistance arrives.  Alternate nostrils with each dose.  Use each nasal spray only one time.  Dispense: 2 each; Refill: 1    6. Pre-procedure lab exam  -We will get baseline labs in  case we do procedures in the future.  - CBC with Differential; Future  - Comprehensive Metabolic Panel; Future      DISPOSITION AFTER TODAY'S VISIT: Follow-up with one of our physicians in clinic after completing the MRI.        Dear Dr. Janalyn Shy, Earvin Hansen, MD    It was a pleasure to see your patient, Samuel Cobb, today in follow up at the [facility].  As you know, Samuel Cobb is a 34yr -old male with a chief complaint of:    Chief Complaint   Patient presents with    Neck Pain    Shoulder Pain     Samuel Cobb is here for shoulder and neck pain    Samuel Cobb notes pain located in the areas indicated in the pain location diagram below.      ALLERGIES:    Allergies   Allergen Reactions    Regal-Am [Quinine Sulfate] Other-Reaction in Comments     shake    Toradol [Ketorolac Tromethamine] Other-Reaction in Comments     shake    Tramadol Other-Reaction in Comments     shake    Zoloft [Sertraline Hcl] Palpitations       CURRENT MEDICATIONS:     Outpatient Medications Marked as Taking  for the 11/07/19 encounter (Office Visit) with Rollene Rotunda, NP   Medication Sig Dispense Refill    Naloxone (NARCAN) 4 mg/actuation Nasal Spray Use as needed for suspected opioid overdose. Call 911. Spray into one nostril upon   signs of opioid overdose.  Additional doses may be given every 2 to 3 minutes until emergency medical assistance arrives.  Alternate nostrils with each dose.  Use each nasal spray only one time. 2 each 1       STOPPED PAIN MEDICATIONS:   None    OPIOID MEDICATIONS  The patient was asked if they are taking opioid medications such as hydrocodone (Vicodin, Norco, Lortab, Zohydro), hydromorphone (Dilaudid, Exalgo), fentanyl (Duragesic, Actiq, Fentora, Subsys), morphine (MS Contin, Kadian, Avinza), oxycodone (percocet, roxycodone, Oxycontin) Methadone, Buprenorphine (Suboxone, Subutex, BuTrans) Oxymorphone (Opana), codeine (Tylenol #3 or #4), Tramadol (Ultram), Tapentadol (Nucynta)     The patient answers  Yes to current opioid use.    TIMING OF PAIN  The patient states that pain is present  constantly (100% of the time)                                  PAIN QUALITY  The patient describes the pain as  burning,cramping,pins and needles,sharp,numbness,shooting,electric-like    PAIN INTENSITY - Visual Analog Scale (VAS): 0-10 Scale (0 = No Pain, 10 = Worst Imaginable Pain)   Currently the patient has a VAS of  9/10.   On average the pain score for the last week has been a VAS of  9 /10.   At its best, the VAS pain score is  7 /10.   At its worst, the VAS pain score is  10 /10.    RELIEVING AND AGGRAVATING FACTORS  The patient was asked if any of the following activities either alleviate or exacerbate their pain: lying down, standing, sitting walking, exercise, medications, relaxation, thinking about something else, coughing/sneezing, urination, or bowel movements  The patient states that the pain:   is relieved by:  nothing    is aggravated by:    lying down  exercise (if  applicable)  coughing/sneezing   bowel movements    FUNCTIONAL ASSESSMENT (Pain Disability Index):   Measures the level of disability related to pain: 0-10 Scale (0= No Disability, 2=Minimal, 5=Moderate, 8=Severe, 10= Total Disability)    ACC Pain Assessment     PAIN FUNCTIONAL ASSESSMENT PDI 11/06/2019    Family and Home 5    Recreation 9    Social 8    Occupation 8    Sexual Behavior 5    Self Care 6    Life Support 5    PDI 46            REVIEW OF SYSTEMS:  The patient was asked to review the list below and report if they were currently experiencing any of the symptoms:     Constitutional   o  fever or chills   o  unplanned weight loss   Eyes   o  double or blurred vision    ENT   o  hearing loss   o  Difficulty swallowing   Hematologic/Lymphatic   o  bleeding gums   o  low platelet count   Endocrine   o  Heat intolerance   o cold intolerance   o thyroid problems   Integumentary   o skin rash   Respiratory   o shortness of breath   o wheezing   Cardiac   o palpitations ( awareness of fast heart)   o chest pain   Gastrointestinal   o constipation   o abdominal pain   o nausea   o vomiting   o diarrhea   Genito-urinary   o sexual dysfunction   o urinary retention or difficulty urinating   Musculoskeletal   o back pain   o neck pain   o joint pain   o muscle pain   Neurological   o loss of consciousness or blackouts   o memory loss   o muscle weakness   o seizures   o trouble walking   o dizziness   o drowsiness   o excessive fatigue   Behavioral   o difficulty falling or remaining asleep   o loss of interest in hobbies or other activities   o difficulty concentrating   o feelings of guilt   o feeling depressed      THE PATIENT ADMITS TO THE FOLLOWING OF THE ABOVE LISTED SYMPTOMS:      abdominal pain  nausea  back pain  neck pain  joint pain (knee, elbow, hip etc.)  muscle pain  muscle weakness  excessive fatigue  difficulty falling or remaining asleep  loss of interest in hobbies or other  activites  feeling depressed    All other systems were negative.    The Review of Systems was reviewed.    PAST MEDICAL, FAMILY, SOCIAL HISTORY:   Family Life: Living Circumstances:  The patient currently lives  alone      Past Medical History:   Diagnosis Date    Anxiety     Diverticulitis        Past Surgical History:   Procedure Laterality Date    EXPLORATORY LAPAROTOMY  2015    PR REMOVAL GALLBLADDER  2011    TONSILLECTOMY  1991       Family History   Problem Relation Name Age of Onset    Other (GI abnormality) Mother           PSYCHOSOCIAL HISTORY:     Social History     Socioeconomic History  Marital status: UNKNOWN     Spouse name: Not on file    Number of children: Not on file    Years of education: Not on file    Highest education level: Not on file   Occupational History    Not on file   Tobacco Use    Smoking status: Never Smoker    Smokeless tobacco: Never Used   Substance and Sexual Activity    Alcohol use: Not Currently     Comment: stopped after gallbladder surgery    Drug use: Yes     Frequency: 3.0 times per week     Types: Marijuana    Sexual activity: Not on file   Other Topics Concern    Not on file   Social History Narrative    Not on file     Social Determinants of Health     Financial Resource Strain:     Difficulty of Paying Living Expenses: Not on file   Food Insecurity:     Worried About Running Out of Food in the Last Year: Not on file    Ran Out of Food in the Last Year: Not on file   Transportation Needs:     Lack of Transportation (Medical): Not on file    Lack of Transportation (Non-Medical): Not on file   Physical Activity:     Days of Exercise per Week: Not on file    Minutes of Exercise per Session: Not on file   Stress:     Feeling of Stress : Not on file   Social Connections:     Frequency of Communication with Friends and Family: Not on file    Frequency of Social Gatherings with Friends and Family: Not on file    Attends Religious Services: Not on  file    Active Member of Clubs or Organizations: Not on file    Attends BankerClub or Organization Meetings: Not on file    Marital Status: Not on file   Intimate Partner Violence:     Fear of Current or Ex-Partner: Not on file    Emotionally Abused: Not on file    Physically Abused: Not on file    Sexually Abused: Not on file     The patient  reports that he has never smoked. He has never used smokeless tobacco.    Samuel Cobb's past medical, family, and social history were reviewed.    PROBLEM LIST:  Patient Active Problem List   Diagnosis    Chronic abdominal pain    Anxiety    Degeneration of cervical intervertebral disc       PREVIOUS DIAGNOSTIC STUDIES:   Images: The report of the image indicated below was reviewed.   09/13/19 Right shoulder x-ray   FINDINGS:  No acute fracture or dislocation. Shoulder alignment is intact. Joint  spaces are maintained. Visualized lung fields and soft tissues are  unremarkable.      IMPRESSION:  1. No acute osseous abnormality.    EMG/NCS 05/12/19  ELECTRODIAGNOSTIC FINDINGS:     1. Normal sensory nerve conduction studies in bilateral median,   ulnar and right radial nerves   2. Normal motor nerve conduction in bilateral median and ulnar   nerves. The difference of onset latencies is borderline prolonged   in left ulnar inching study of the segment between elbow and 2.5   cm distal to elbow   3. Needle EMG was performed in selective muscles of bilateral   upper extremities that were  within normal range except slightly   decreased recruitment of left FCU muscle     CLINICAL IMPRESSION:   Borderline prolonged difference of onset latencies in left ulnar   nerve between elbow and 2/5 com distal to the elbow. 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     Comments:   Occupational therapy was ordered. The patient was instructed to   avoid elbow compression and complete occupational  therapy.     Cervical MRI 12/31/18  FINDINGS:    Alignment: The alignment of the cervical vertebrae is slightly distorted  with accentuation of the normal lordosis in the upper cervical region and  loss of the normal lordosis around C6. In addition, there is trace  retrolisthesis of C5 on C6.    Vertebral body heights and marrow: Vertebral body height is maintained.  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    Spinal Cord:The craniocervical junction is unremarkable. There is no  absolute evidence of myelopathic changes within the cord.    Soft tissues: Normal.     At C2-3: Mild uncovertebral hypertrophy is present with no effect on the  neural structures.    At C3-4:The canal and foramina are patent.    At C4-5:Global disc bulging, uncovertebral hypertrophy contribute to mild  flattening of the ventral thecal sac without spinal stenosis. There is no  significant foraminal narrowing.    At C5-6:A broad-based disc bulge is present which is slightly lateralized  to the right. An annular tear is present posteriorly, which could be pain  generating. There is associated uncovertebral and laminar hypertrophy,  resulting in mild posterior displacement of the thecal sac. There is mild  left foraminal narrowing and moderate right foraminal narrowing.    At C6-7:A global disc bulges present with an associated annular tear. There  is significant uncovertebral hypertrophy. There is moderate spinal stenosis  and moderately severe bilateral foraminal narrowing at this level.    At C7-T1:The canal and foramina are normally patent.    IMPRESSION:    1. 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  2. Broad-based disc bulges at C5-6 and C6-7 which harbor annular tears.     Labs: The lab/study results below were reviewed.    Lab Results   Lab Name Value Date/Time    WBC 9.6 11/07/2019 03:57 PM    HGB 14.3  11/07/2019 03:57 PM    HCT 42.9 11/07/2019 03:57 PM    PLT 228 11/07/2019 03:57 PM       Lab Results   Lab Name Value Date/Time    NA 135 11/07/2019 03:57 PM    K 4.0 11/07/2019 03:57 PM    CL 99 11/07/2019 03:57 PM    CO2 25 11/07/2019 03:57 PM    BUN 7 (L) 11/07/2019 03:57 PM    CR 0.79 11/07/2019 03:57 PM    GLU 70 11/07/2019 03:57 PM       No results found for: PGLU    Lab Results   Lab Name Value Date/Time    AST 22 11/07/2019 03:57 PM    ALT 16 11/07/2019 03:57 PM    ALP 58 11/07/2019 03:57 PM    ALB 4.6 11/07/2019 03:57 PM    TP 7.7 11/07/2019 03:57 PM    TBIL 0.5 11/07/2019 03:57 PM       No  results found for: HGBA1C    No results found for: A1CPOC    No results found for: INR    No results found for: INR    No results found for: BNP    No components found for: TROPONIN1    Urine Toxicology  No results found for: DRUGGCSCRNU  No results found for: AMPHETSCRNU  No results found for: BENZOSSCRNU  No results found for: COCAINESCRNU  No results found for: BARBSSCRNU      RESULTS OF MOST RECENT PROCEDURE:  Please see encounter summary.    PHYSICAL EXAM:    Vitals:    11/07/19 1436   BP: 111/72   SITE: left arm   Orthostatic Position: sitting   Cuff Size: large   Pulse: 77   Resp: 16   Temp: 36.7 C (98 F)   SpO2: 100%   Weight: 65.9 kg (145 lb 4.5 oz)   Height: 1.702 m (5\' 7" )     Body mass index is 22.75 kg/m.     Constitutional: Normally developed, no visual deformities, well groomed.  Skin:  Skin color, texture WNL. No visual rashes or lesions .  Eyes:  conjunctivae and corneas clear. Visual EOM's intact. sclerae normal.  Ears:  external inspection of ears show no abnormality.  Nose:  normal.    Musculoskeletal: Gait/Station is not antalgic .  Neuro: Movement Assessment Upper Limb  Bulk:   normal  Tone:  normal  Abnormal Movements:  no    Strength Movement Root Nerve Muscle   Bilateral 5 Shoulder abduction C5/6 Axillary Deltoid   Bilateral 5 Elbow flexion C5/6 Musculocut., Radial Biceps, Brachioradialis    Bilateral 5 Elbow extension C6/7/8 Radial Triceps   Bilateral 5 Wrist extension C5/6 Radial Ext. carpi rad. longus   Bilateral 5 Finger extension C7/8 Post. Interos. N. (radial) Ext. dig. comm.   Bilateral 5 Finger flexion (index) C7/8 Ant. Interos. N. (median) Flexor dig. Prof. (index)   Bilateral 5 Finger flexion (ring, little) C7/8 Ulnar Flex. dig. prof. (ring + little)   Bilateral 5 Finger abduction C8/T1 Ulnar 1st dorsal interos.   Bilateral 5 Thumb abduction C8/T1 Median ABD. Pol. brevis       Upper extremity reflexes: biceps - bilateral 2+  brachioradialis - bilateral 2+  triceps - bilateral 2+  Hoffman's sign is not present bilateral ; Wrist clonus is not present bilateral .    Sensory Assessment:  Sensory:  bilateral  upper  extremities are intact and symmetrical light touch.  There is no  allodynia or hyperalgesia in the bilateral upper extremities .      Psych:Appearance/Cooperation: in no apparent distress  Attitude: pleasant   Behavior :normal  Eye Contact: normal  Attention Span: good  Speech: normal volume, rate, and pitch  Affect: full and appropriate  Thought Process: logical and goal directed  Thought Content: no thought disorder or psychotic symptoms  Memory: good  Judgment: good    Cervical Spine:  range of motion is full with flexion and is associated with no change in pain.  range of motion is full with extension and is associated with increase in pain.  range of motion is full with rotation and is associated with increase in pain.    Spurling's manuever on the right cause increased pain located right trapezius and upper arm    Myofascial exam:There were trigger points, jump signs, or palpable bands over the right cervical para spinal muscles  And trapezius. .  The patient did have myofascial tenderness in  the right cervical muscle regions    His right shoulder range of motion was full with subjective pain    right Shoulder Exam:   Supraspinatus: abductor   Subscapularis:  internalrotator   Infraspinatus: external rotator   Teres minor: external rotator    Shoulder Girdle Anatomy of the Shoulder Rotator Cuff Muscles            APLEY SCRATCH TEST:  Steps   aBduction and external rotation are tested by having the patient reach behind the head and touch the superior aspect of the opposite scapula.    aDduction and internal rotation are tested by having the patient reach behind the back and touch the inferior  Positive Test   Decreased range of motion  Positive Test Implications   used to check shoulder range of motion.   Limitation with range of motion:     no   SUPRASPINATUS ("EMPTY CAN") TEST:   Steps   Patient stands with both shoulders abducted to 90, horizontally aDducted 30, and internally rotated so the patient's thumbs point to the floor   Examiner resists the patient's attempts to actively elevate both shoulders  Positive Test   Weakness and/or report of pain  Positive Test Implications   Involvement of the supraspinatus muscle and/or tendon  Right positive   INFRASPINATUS AND TERES MINOR EXAMINATION:   Steps   with the patient's arms at the sides, the patient flexes both elbows to 90 degrees    examiner provides resistance against external rotation.   Positive Test   Weakness  Positive Test Implications   Involvement of the infraspinatus and teres minor muscles, which are mainly responsible for external rotation   not done   SUBSCAPULARIS ("LIFT OFF") TEST:   Steps   Patient is standing and is asked to place  dorsum of hand against the lumbar region   Examiner resists patient's attempt to lift hand off of the back  Positive Test   Inability of patient to push (or lift off) hand from back  Positive Test Implications   Subscapularis tear or injury  Right negative   NEER'S TEST:    Steps   Patient is sitting or standing with the elbow extended and wrist fully pronated   To prevent scapulothoracic motion, the examiner stabilizes the patient's scapula  (posteriorly) with one hand and then passively and maximally forward flexes the patient's shoulder  Positive Test   Shoulder pain and apprehension   Positive Test Implications   supraspinatus and biceps long head tendon impingement under the coracoacromial arch   not done   HAWKIN'S TEST:   Steps   Patient is sitting or standing with upper extremities relaxed   Examiner grasps the patient's elbow with one hand and the patient's wrist with the other hand   Examiner forward flexes the shoulder to 90 degrees and then internally rotates the patient's shoulder  Positive Test   Pain and apprehension during the motion  Positive Test Implications   Subacromial impingement of the supraspinatus or long head of the biceps brachii tendon  Right positive   SPEED'S TEST:   Steps   Patient is sitting or standing with shoulder flexed to 60, elbow fully extended, and forearm supinated   Examiner places one hand on the patient's forearm and the other hand over the bicipital groove, while resisting the patient's attempt to actively flex the shoulder  Positive Test   Anterior shoulder pain and/or tenderness in the bicipital groove  Positive Test Implications  Bicipital tendonitis  not done    Ligament       TENDERNESS IN THE BICIPITAL GROOVE:   Steps   palpation in the bicipital groove  Positive Test   Tenderness in the bicipital groove  Positive Test Implications   Bicipital tendonitis   Right positive   DROP ARM TEST:   Steps   Patient is sitting or standing with the involved arm fully abducted to 180   Patient then slowly lowers the arm down to the waist  Positive Test   Patient' arm suddenly drops to the side  OR the patient has significant pain when attempting to perform the task (NOTE: The patient may be able to lower the arm slowly to 90 degrees because this is a function mostly of the deltoid muscle, but will be unable to continue the maneuver as far as the waist.)  Positive Test Implications   Rotator  cuff tear or supraspinatus dysfunction    Right negative   CROSS-ARM (SCARF) TEST:   Steps   Patient is standing/sitting   Examiner places one hand on the posterior aspect of the patient's shoulder and the other hand on the patient's elbow   Examiner stabilizes the patient's trunk and then passively and maximally horizontally adducts the test shoulder, forcing the acromion into the distal end of the clavicle  Positive Test   ANTERIOR shoulder pain OR   POSTERIOR shoulder pain  Positive Test Implications   ANTERIOR pain: Acromioclavicular joint pathology (anterior/superior pain)   POSTERIOR pain: Infraspinatus, teres minor, and/or posterior capsule pathology (posterior pain)  Right negative     MEDICAL DECISION MAKING     Records Reviewed:   Lab/study reports reviewed above were important and necessary to evaluate for any contraindications with the treatment plan. (See encounter summary and plan for specifics)  Images viewed/reviewed above were important and necessary to evaluate for any contraindications to proceed with treatment plan.  (See encounter summary and plan for specifics)  The report by previous pain management notes/relevant notes reviewd.  This was important and useful because the patient's history was corroborated in the reviewed note.     Review of the Risk of Comorbidities:  At this juncture, we believe that the patient's constitutional status adds moderate risk and complexity to our proposed evaluation and treatment.    ASSESSMENT AND RECOMMENDATIONS/TREATMENT PLAN:   Please see the beginning of the note.       The patient was instructed and educated on all aspects of the plan of care.  The patient acknowledged the plan of care.    Samuel Cobb Pancoast, NP-C  Nurse Practitioner  Department of Anesthesiology & Pain Medicine    Note Electronically Signed By:  Rollene Rotunda, NP-C    Supervising physician: Dr. Charlyne Quale     Samuel Cobb: If you are reviewing this progress note and have questions  about the meaning or medical terms being used, please schedule an appointment or bring it up at your next follow-up appointment.  Medical notes are a communication tool between medical professionals and require medical terms to be used for efficiency. You can also look up terms via on-line medical dictionaries such as Medline Plus at [email or web address].[email or web address]

## 2019-11-08 ENCOUNTER — Encounter: Payer: Self-pay | Admitting: PAIN MANAGEMENT

## 2019-11-08 NOTE — Telephone Encounter (Signed)
From: [name]  To: Pain [facility]  Sent: 11/08/2019 1:27 PM PDT  Subject: Non-urgent Medical Advice Question    I was told that I can schedule my covid vaccination through my mychart but I cant find any links to get signed up for it. How do I get signed up for it?

## 2019-11-08 NOTE — Telephone Encounter (Signed)
This encounter needs no further action, please close the encounter.

## 2019-11-10 ENCOUNTER — Encounter: Payer: Self-pay | Admitting: NURSE PRACTITIONER

## 2019-11-21 ENCOUNTER — Telehealth: Payer: Self-pay | Admitting: PAIN MANAGEMENT

## 2019-11-21 NOTE — Telephone Encounter (Signed)
Received call from [employer] rep who stated case is pending denial. Asked what additional information is needed for approval. [employer] is looking for documentation of 6 weeks of neck stretches, physical therapy, physician guided home exercises in the last six months. Below is note on physical therapy. Peer to peer now optional.        Peer To Peer # [id number] Opt 3  Tracking [id number]    Sincerely,  Jacqulyn Liner  [facility]   Department of Anesthesiology & Pain Medicine  Patient: [phone]  Direct : [phone]

## 2019-11-24 NOTE — Telephone Encounter (Signed)
I called patient to follow up on this info. Patient reported that he does he did  physical therapy around early to mid last year without benefit. He did about 12 vistis and does his home exercises daily since then.     Please provide above info to patient's insurance.

## 2019-11-25 NOTE — Telephone Encounter (Addendum)
Called [employer] to provide additional clinicals. Per [employer] rep, case has been denied as peer to peer was not completed in 24 hours and a new case is not able to be made. Only option is to appeal via insurance carrier. Called [employer] appeal line at [phone] and spoke with Tania. Faxed appeal to [facility] and Wellness @ [phone]. 30 day ETA.    Call Ref # [id number]    Called and informed patient of appeal via [employer] and 30 day lead time. Patient stated understanding and had no questions.    Sincerely,  Jacqulyn Liner  [facility]   Department of Anesthesiology & Pain Medicine  Patient: [phone]  Direct : [phone]

## 2020-01-16 NOTE — Telephone Encounter (Signed)
Appeal decision has been made. Received carrier authorization.   -Authorization # [id number]  -Valid Dates: 12/22/2019 - 06/19/2020    Called and left a message informing patient and sent a my-chart message with contact information.            Sincerely,  Jacqulyn Liner  [name]   Department of Anesthesiology & Pain Medicine  Patient: [phone]  Direct : [phone]

## 2020-02-20 ENCOUNTER — Ambulatory Visit
Admission: RE | Admit: 2020-02-20 | Discharge: 2020-02-20 | Disposition: A | Discharge status: Home / Self Care | Payer: Medicaid (Managed Care) | Source: Ambulatory Visit | Attending: Body Imaging | Admitting: Body Imaging

## 2020-02-20 DIAGNOSIS — M4802 Spinal stenosis, cervical region: Secondary | ICD-10-CM | POA: Insufficient documentation

## 2020-02-20 DIAGNOSIS — M5412 Radiculopathy, cervical region: Secondary | ICD-10-CM

## 2020-02-20 DIAGNOSIS — M47812 Spondylosis without myelopathy or radiculopathy, cervical region: Secondary | ICD-10-CM

## 2020-02-20 DIAGNOSIS — M5030 Other cervical disc degeneration, unspecified cervical region: Secondary | ICD-10-CM | POA: Insufficient documentation

## 2020-02-23 ENCOUNTER — Telehealth: Payer: Self-pay | Admitting: PAIN MANAGEMENT

## 2020-02-23 NOTE — Telephone Encounter (Signed)
Pain Clinic Referral Team:      Please contact Samuel Cobb to see if he'd like to schedule a follow up visit with one of our pain managment physicians to review his MRI cervical spine. (former patient of Dr. Charlyne Quale').    If so, schedule as a 60 minute in-person follow up any pain physician

## 2020-02-27 NOTE — Telephone Encounter (Signed)
Called and scheduled p[atient for 60 min f/v w/NS on 03/14/2020 to go over imaging study findings.     Sincerely,  Jacqulyn Liner  [facility]   Department of Anesthesiology & Pain Medicine  Patient: [phone]  Direct : [phone]

## 2020-03-13 NOTE — Progress Notes (Deleted)
At C2-3: Unremarkable.    At C3-4:Unremarkable.    At C4-5:Disc bulge and uncovertebral hypertrophy, causing mild narrowing of  the thecal sac, similar to previous exam.    At C5-6:Disc bulge with annular tear and uncovertebral hypertrophy, causing  moderate spinal stenosis. Moderately severe bilateral neuroforaminal  narrowing, slightly increased from prior exam.    At C6-7:Disc bulge with annular tear and uncovertebral hypertrophy, causing  moderate spinal stenosis and moderately severe bilateral neuroforaminal  narrowing.    At C7-T1:Unremarkable.    IMPRESSION:     1. 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

## 2020-03-14 ENCOUNTER — Encounter: Payer: Self-pay | Admitting: ANESTHESIOLOGISTS

## 2020-03-14 ENCOUNTER — Ambulatory Visit: Payer: Medicaid (Managed Care) | Attending: ANESTHESIOLOGISTS | Admitting: ANESTHESIOLOGISTS

## 2020-03-14 VITALS — BP 100/61 | HR 97 | Temp 98.0°F | Resp 16 | Ht 67.0 in | Wt 141.5 lb

## 2020-03-14 DIAGNOSIS — F1190 Opioid use, unspecified, uncomplicated: Secondary | ICD-10-CM

## 2020-03-14 DIAGNOSIS — F1120 Opioid dependence, uncomplicated: Secondary | ICD-10-CM | POA: Insufficient documentation

## 2020-03-14 DIAGNOSIS — M4722 Other spondylosis with radiculopathy, cervical region: Secondary | ICD-10-CM | POA: Insufficient documentation

## 2020-03-14 DIAGNOSIS — F410 Panic disorder [episodic paroxysmal anxiety] without agoraphobia: Secondary | ICD-10-CM | POA: Insufficient documentation

## 2020-03-14 DIAGNOSIS — F419 Anxiety disorder, unspecified: Secondary | ICD-10-CM | POA: Insufficient documentation

## 2020-03-14 DIAGNOSIS — M47812 Spondylosis without myelopathy or radiculopathy, cervical region: Secondary | ICD-10-CM

## 2020-03-14 DIAGNOSIS — F1320 Sedative, hypnotic or anxiolytic dependence, uncomplicated: Secondary | ICD-10-CM | POA: Insufficient documentation

## 2020-03-14 DIAGNOSIS — M5412 Radiculopathy, cervical region: Secondary | ICD-10-CM

## 2020-03-14 NOTE — Nursing Note (Signed)
Patient name and DOB verified. Patient roomed and vital signs obtained. Medication allergies and pain level verified. Tobacco use history reviewed.     Patient WAS wearing a surgical mask  Droplet precautions were followed when caring for the patient.   PPE used by provider during encounter: Surgical mask, Face Shield/Goggles and Gloves

## 2020-03-14 NOTE — Progress Notes (Signed)
-   The patient was seen, evaluated, and care plan was developed with the pain fellow, Dr. Wang, on the date indicated in the Pain fellow's note. I agree with the findings and plan as outlined in the Pain fellow's note.  - The patient was instructed and educated on all aspects of the plan of care.  The patient acknowledged the plan of care.  - Total time I spent in care of this patient today (including face-to-face time with the patient, review of the medical records today both before and after the visit, charting; but excluding time spent on other billable services) was 35 minutes. This is non-overlapping time from any other health care professional time statement that may be included on this date of service.    Levia Waltermire S Nasri Boakye, MD  Department of Anesthesiology & Pain Medicine  Attending    Note Electronically Signed By:  Shanine Kreiger S Selen Smucker, MD

## 2020-03-14 NOTE — Patient Instructions (Signed)
PAIN MANAGEMENT CLINIC  AFTER VISIT INSTRUCTIONS      Pain Clinic [phone] or [phone]: available during business hours, 8 am - 5 pm  On-call physician [phone] THIS IS A NUMERICAL PAGER -DO NOT LEAVE VOICE MESSAGE.  Physician is available after business hours, 7 days per week including holidays.      Today you attended a new patient consult or follow up appointment    Your next appointment to schedule is a procedure  VERY IMPORTANT  INSTRUCTIONS FOR YOUR FOLLOW UP APPOINTMENT  Please remember to arrive 15 minutes prior to your appointment time.  This will allow time for check in.  If you arrive after your scheduled appointment time your appointment could be rescheduled or your visit with the physician will be shortened.      NOTE:  If your Pain Physician ordered a procedure, lab, diagnostic study (x-ray, MRI, CT, ultrasound, EMG, EKG, etc.), or specialty referral for you today, please be aware that some insurance carriers may require authorization before they can be scheduled. If these cannot be scheduled at the time of discharge today, you will be contacted with the scheduled appointment.  If you are not contacted within 3-5 business days, please call our office at [phone].          Procedure To Schedule FOR YOUR Next Visit:       PLEASE NOTIFY STAFF IF YOU HAVE A PACEMAKER, AICD, SPINAL CORD STIMULATOR OR OTHER IMPLANTABLE DEVICE BEFORE YOUR NEXT APPOINTMENT.      NOTE: Please be aware of which location you are scheduled for your next appointment. The [facility] has 2 locations in [place]:   (1) the [facility] at the [facility] and   (2) the [facility] [address] [facility].   If you have any questions regarding the location of your next appointment, please call our office: [phone].             PLEASE FOLLOW ALL INSTRUCTIONS CAREFULLY  Instructions and medication holds may verify by procedure.  Please call to confirm the instructions specific to your procedure, [phone].        If you are SICK, it is not safe to do your procedure. Therefore, please call us as soon as possible to reschedule. Since we do keep a waiting list, your courtesy will allow us to schedule another patient. Please, call our office if you have any questions.    If you are NOT IN PAIN OR HAVE MINIMAL PAIN, please call to postpone your procedure (unless otherwise instructed by your Pain Physician).          MEDICATIONS    If your Pain Physician prescribes medications for you, please anticipate when you will require a refill.  It can take 1-3 business days to complete the process. Contact your pharmacy for medication refills. Your pharmacy will contact the Pain Clinic office with detailed medication information.         Driving When You Are Taking Medications    For most people, driving represents freedom, control and independence. Driving enables most people to get to the places they want or need to go. For many people, driving is important economically - some drive as part of their job or to get to and from work.   Driving is a complex skill. Our ability to drive or operate heavy or dangerous machinery safely can be affected   by changes in our physical, emotional and mental condition. The goal of this brochure is to help you and your health care professional talk about how your medications may affect your ability to drive safely.     How can medications affect my driving or ability to operate heavy or dangerous machinery?   People take medications for a variety of reasons. Those can include:          allergies          anxiety          colds          depression          diabetes          heart and cholesterol conditions          high blood pressure           muscle spasms          pain          Parkinson's disease          schizophrenia   Medications may be prescribed by your doctor or purchased over-the-counter without a doctor's prescription. Many individuals also take herbal supplements. Some of these medications and supplements may cause a variety of reactions that may make it more difficult for you to drive a car or operate heavy or dangerous machinery safely. These reactions may include:          sleepiness          blurred vision          dizziness          slowed movement          fainting          inability to focus or pay attention          nausea   Often people take more than one medication at a time. The combination of different medications can cause problems for some people. This is especially true for older adults because they take more medications than any other age group. Due to changes in the body as people age, older adults are more prone to medication related problems. The more medications you take, the greater your risk that your medicines will affect your ability to drive safely. To help avoid problems, it is important that at least once a year you talk to your doctor or pharmacist about all the medications - both prescription and over-the-counter - you are taking. Also let your professional know what herbal supplements, if any, you are taking. Do this even if your medications and supplements are not currently causing you a problem.     Can I still drive or operate heavy or dangerous machinery safely if I am taking medications?     Yes, most people can drive or operate heavy or dangerous machinery safely if they are taking medications. It depends on the effect those medications - both prescription and over-the-counter - have on your coordination and mental function. In some cases you may not be aware of the effects. But, in many instances, your doctor can help to minimize the negative impact of your medications on your driving or operate heavy or  dangerous machinery in several ways. Your doctor may be able to:          adjust the dose;          adjust the timing of doses or when you take the medication;            add an exercise or nutrition program to lessen the need for medication; and          change medication to one that causes less drowsiness.     What can I do if I am taking medications?   Talk to your doctor honestly.   When your doctor prescribes a medicine for you, ask about side effects. How should you expect the medicine to affect your ability to drive or operate heavy or dangerous machinery? Remind your doctor of other medications - both prescription and over-the-counter - and herbal supplements you are taking, especially if you see more than one doctor. Talking honestly with your doctor also means telling the doctor if you are not taking all or any of the prescribed medication. Do not stop taking your medication unless your doctor tells you to.   Ask your doctor if you should drive - especially when you first take a medication.   Taking a new medication can cause you to react in a number of ways. It is recommended that you do not drive or operate heavy or dangerous machinery when you first start taking a new medication until you know how that drug affects you. You also need to be aware that some over-the-counter medicines and herbal supplements can make it difficult for you to drive or operate heavy or dangerous machinery safely.     Talk to your pharmacist.   Get to know your pharmacist. Ask the pharmacist to go over your medications with you and to remind you of effects they may have on your ability to drive or operate heavy or dangerous machinery safely. Be sure to request printed information about the side effects of any new medication. Remind your pharmacist of other medicines and herbal supplements you are taking. Pharmacists are available to answer questions wherever you get your medications. Many people buy medicines by mail. Mail-order  pharmacies have a toll-free number you can call and a pharmacist available to answer your questions about medications.   Monitor yourself.   Learn to know how your body reacts to the medications and supplements. Keep track of how you feel after you take the medication. For example, do you feel sleepy? Is your vision blurry? Do you feel weak and slow? When do these things happen?   Let your doctor and pharmacist know what is happening.   No matter what your reaction is to taking a medicine - good or bad - tell your doctor and pharmacist. Both prescription and over-the-counter medications are powerful-that's why they work. Each person is unique. Two people may respond differently to the same medicine. If you are experiencing side effects, the doctor needs to know that in order to adjust your medication. Your doctor can help you find medications that work best for you.     What if I have to cut back or give up driving?   You can keep your independence even if you have to cut back or give up on your driving due to your need to take medications. It may take planning ahead on your part, but it will get you to the places you want to go and the people you want to see. Consider:          rides with family and friends;          taxi cabs;          shuttle buses or vans;          public buses, trains and subways;   and          walking.   Also, senior centers and religious and other local service groups often offer transportation services for older adults in the community.     Thank you for choosing  Pain Management Clinic.
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
## 2020-05-09 ENCOUNTER — Ambulatory Visit: Payer: Medicaid (Managed Care)

## 2020-05-09 ENCOUNTER — Ambulatory Visit: Payer: Medicaid (Managed Care) | Attending: CRIT CARE MED, ANES | Admitting: ANESTHESIOLOGISTS

## 2020-05-09 ENCOUNTER — Encounter: Payer: Self-pay | Admitting: ANESTHESIOLOGISTS

## 2020-05-09 VITALS — BP 110/68 | HR 73 | Temp 98.0°F | Resp 18 | Ht 67.0 in | Wt 139.6 lb

## 2020-05-09 DIAGNOSIS — R4589 Other symptoms and signs involving emotional state: Secondary | ICD-10-CM | POA: Insufficient documentation

## 2020-05-09 DIAGNOSIS — M5412 Radiculopathy, cervical region: Secondary | ICD-10-CM | POA: Insufficient documentation

## 2020-05-09 MED ORDER — SODIUM CHLORIDE 0.9 % INTRAVENOUS SOLUTION
500.0000 mL | Freq: Once | INTRAVENOUS | 0 refills | Status: DC
Start: 2020-05-09 — End: 2020-05-09

## 2020-05-09 MED ORDER — MIDAZOLAM 1 MG/ML INJECTION SOLUTION
INTRAMUSCULAR | 0 refills | Status: DC
Start: 2020-05-09 — End: 2020-05-09

## 2020-05-09 MED ORDER — GLYCOPYRROLATE 0.2 MG/ML INJECTION SOLUTION
0.2000 mg | INTRAMUSCULAR | 0 refills | Status: DC | PRN
Start: 2020-05-09 — End: 2020-05-09

## 2020-05-09 NOTE — Patient Instructions (Addendum)
PAIN MANAGEMENT CLINIC  AFTER VISIT INSTRUCTIONS    PLEASE NOTIFY STAFF IF YOU HAVE A PACEMAKER, AICD, SPINAL CORD STIMULATOR OR OTHER IMPLANTABLE DEVICE BEFORE YOUR NEXT APPOINTMENT.      [facility] Clinic [phone] or [phone]: available during business hours, 8 am - 5 pm  On-call physician [phone] THIS IS A NUMERICAL PAGER -DO NOT LEAVE VOICE MESSAGE.  Physician is available after business hours, 7 days per week including holidays.        Procedure Performed Today:     Epidural Steroid Injection-Cervical  If you experience any of the following symptoms after your procedure, please notify our office or on-call physician immediately (see above for contact information):   fever (increased oral temperature)   bleeding or swelling at the injection site,    drainage, rash or redness at the injection site    possible signs of infection    increased pain at the injection site   worsening of your usual pain   severe headache   new or worsening numbness    new arm and/or leg weakness, or    changes in bowel and/or bladder function: urinating or defecating on yourself and not knowing that you did it.    PLEASE FOLLOW ALL INSTRUCTIONS CAREFULLY     Do not engage in strenuous activity (e.g., lifting or pushing heavy objects or repeated bending) for 24 hours.     Do not take a bath, swim or use Jacuzzi for 24 hours after procedure. (A shower is fine).   Remove any Band-Aids when you get home.    Use cold/ice, as needed for comfort.  We recommend the use of cold therapy alternating on for 20 minutes, off for 20 minutes.    Do not apply direct heat (heating pad or heat packs) to the injection site for 24 hours.     Resume your usual medications, unless instructed otherwise by your Pain Physician.     If you are on warfarin (Coumadin) or other blood thinner, resume this medication as instructed by your prescribing  Physician.       Pain Clinic [phone] or [phone]: available during business hours, 8 am - 5 pm   On-call physician [phone]: available after business hours, 7 days per week including holidays).    POST SEDATION INSTRUCTIONS    Today you received intravenous medication (also known as sedation) that was used to help you relax and/or decrease discomfort during your procedure. This medication will be acting in your body for the next 24 hours, so you might feel a little tired or sleepy. This feeling will slowly wear off.   Common side effects associated with these medications include: drowsiness, dizziness, sleepiness, confusion, feeling excited, difficulty remembering things, lack of steadiness with walking or balance, loss of fine muscle control, slowed reflexes, difficulty focusing, and blurred vision.  Some over-the-counter and prescription medications (e.g., muscle relaxants, opioids, mood-altering medications, sedatives/hypnotics, antihistamines) can interact with the intravenous medication you received and cause an increased risk of the side effects listed above in addition to other potentially life threatening side effects. Use extreme caution if you are taking such medications, and consult with your Pain Physician or prescribing physician if you have any questions.  For the next 24 hours:    DO NOT--Drive a car, operate machinery or power tools   DO NOT--Drink any alcoholic beverages (not even beer), they may dangerously increase the risk of side   effects.    DO NOT--Make any important legal or business decisions or sign important documents.  We advise you to have someone to assist you at home. Move slowly and carefully. Do not make sudden changes in position. Be aware of dizziness or light-headedness and move accordingly.   If you seek medical treatment within 24 hours, let the nurse or doctor caring for you know that you  have received the above medications. If you have any questions or concerns related to your sedation or treatment today please contact us.     Keep a record of your response to the injection you had today.     How much relief did you get?   Were you able to decrease the use of any of your pain medications?   Were you able to increase your level of activity?   How long did the relief last?     NOTE:  If your Pain Physician ordered a procedure, lab, diagnostic study (x-ray, MRI, CT, ultrasound, EMG, EKG, etc.), or specialty referral for you today, please be aware that some insurance carriers may require authorization before they can be scheduled. If these cannot be scheduled at the time of discharge today, you will be contacted with the scheduled appointment.  If you are not contacted within 3-5 business days, please call our office at [phone].          Procedure To Schedule FOR YOUR Next Visit:   Cervical Epidural steroid injection  VERY IMPORTANT  INSTRUCTIONS FOR YOUR FOLLOW UP APPOINTMENT  Please remember to arrive 15 minutes prior to your appointment time.  This will allow time for check in.  If you arrive after your scheduled appointment time your appointment could be rescheduled or your visit with the physician will be shortened.      NOTE: Please be aware of which location you are scheduled for your next appointment. The [facility] has 2 locations in [place]: (1) the [facility] at the [facility] and (2) the [facility] at the [employer] [facility]. If you have any questions regarding the location of your next appointment, please call our office: [phone].      PLEASE FOLLOW ALL INSTRUCTIONS CAREFULLY  Instructions and medication holds may verify by procedure.  Please call to confirm the instructions specific to your procedure, [phone].        If you are SICK, it is not safe to do  your procedure. Therefore, please call us as soon as possible to reschedule. Since we do keep a waiting list, your courtesy will allow [place] to schedule another patient. Please, call our office if you have any questions.    If you are NOT IN PAIN OR HAVE MINIMAL PAIN, please call to postpone your procedure (unless otherwise instructed by your Pain Physician).        MEDICATIONS    If your Pain Physician prescribes medications for you, please anticipate when you will require a refill.  It can take 1-3 business days to complete the process. Contact your pharmacy for medication refills. Your pharmacy will contact the Pain Clinic office with detailed medication information.         Driving When You Are Taking Medications    For most people, driving represents freedom, control and independence. Driving enables most people to get to the places they want or need to go. For many people, driving is important economically - some drive as part of  can be affected by changes in our physical, emotional and mental condition. The goal of this brochure is to help you and your health care professional talk about how your medications may affect your ability to drive safely.     How can medications affect my driving or ability to operate heavy or dangerous machinery?   People take medications for a variety of reasons. Those can include:          allergies          anxiety          colds          depression          diabetes          heart and cholesterol conditions          high blood pressure          muscle spasms          pain          Parkinson's disease          schizophrenia   Medications may be prescribed by your doctor or purchased over-the-counter without a doctor's prescription. Many individuals also take herbal supplements. Some of these medications and supplements may cause a variety of  reactions that may make it more difficult for you to drive a car or operate heavy or dangerous machinery safely. These reactions may include:          sleepiness          blurred vision          dizziness          slowed movement          fainting          inability to focus or pay attention          nausea   Often people take more than one medication at a time. The combination of different medications can cause problems for some people. This is especially true for older adults because they take more medications than any other age group. Due to changes in the body as people age, older adults are more prone to medication related problems. The more medications you take, the greater your risk that your medicines will affect your ability to drive safely. To help avoid problems, it is important that at least once a year you talk to your doctor or pharmacist about all the medications - both prescription and over-the-counter - you are taking. Also let your professional know what herbal supplements, if any, you are taking. Do this even if your medications and supplements are not currently causing you a problem.     Can I still drive or operate heavy or dangerous machinery safely if I am taking medications?     Yes, most people can drive or operate heavy or dangerous machinery safely if they are taking medications. It depends on the effect those medications - both prescription and over-the-counter - have on your coordination and mental function. In some cases you may not be aware of the effects. But, in many instances, your doctor can help to minimize the negative impact of your medications on your driving or operate heavy or dangerous machinery in several ways. Your doctor may be able to:          adjust the dose;          adjust the timing of doses or when you take the medication;            adjust the dose;          adjust the timing of doses or when you take the medication;          add an exercise or nutrition program to lessen the need for medication; and           change medication to one that causes less drowsiness.     What can I do if I am taking medications?   Talk to your doctor honestly.   When your doctor prescribes a medicine for you, ask about side effects. How should you expect the medicine to affect your ability to drive or operate heavy or dangerous machinery? Remind your doctor of other medications - both prescription and over-the-counter - and herbal supplements you are taking, especially if you see more than one doctor. Talking honestly with your doctor also means telling the doctor if you are not taking all or any of the prescribed medication. Do not stop taking your medication unless your doctor tells you to.   Ask your doctor if you should drive - especially when you first take a medication.   Taking a new medication can cause you to react in a number of ways. It is recommended that you do not drive or operate heavy or dangerous machinery when you first start taking a new medication until you know how that drug affects you. You also need to be aware that some over-the-counter medicines and herbal supplements can make it difficult for you to drive or operate heavy or dangerous machinery safely.     Talk to your pharmacist.   Get to know your pharmacist. Ask the pharmacist to go over your medications with you and to remind you of effects they may have on your ability to drive or operate heavy or dangerous machinery safely. Be sure to request printed information about the side effects of any new medication. Remind your pharmacist of other medicines and herbal supplements you are taking. Pharmacists are available to answer questions wherever you get your medications. Many people buy medicines by mail. Mail-order pharmacies have a toll-free number you can call and a pharmacist available to answer your questions about medications.   Monitor yourself.   Learn to know how your body reacts to the medications and supplements. Keep track of how you feel after you  take the medication. For example, do you feel sleepy? Is your vision blurry? Do you feel weak and slow? When do these things happen?   Let your doctor and pharmacist know what is happening.   No matter what your reaction is to taking a medicine - good or bad - tell your doctor and pharmacist. Both prescription and over-the-counter medications are powerful-that's why they work. Each person is unique. Two people may respond differently to the same medicine. If you are experiencing side effects, the doctor needs to know that in order to adjust your medication. Your doctor can help you find medications that work best for you.     What if I have to cut back or give up driving?   You can keep your independence even if you have to cut back or give up on your driving due to your need to take medications. It may take planning ahead on your part, but it will get you to the places you want to go and the people you want to see. Consider:          rides with family and friends;            taxi cabs;          shuttle buses or vans;          public buses, trains and subways; and          walking.   Also, senior centers and religious and other local service groups often offer transportation services for older adults in the community.     Thank you for choosing [facility].

## 2020-05-09 NOTE — Progress Notes (Signed)
Procedure:  Cervical, C7-T1, Midline Interlaminar Epidural Steroid Injection with Fluoroscopy       Epidural Steroid Injection # 1    Postprocedural Diagnosis: Same    Anesthesia - Midazolam 1 milligrams for anxiety  IV Fluids - Normal Saline - 500 ml  Tuohy Needle Type: 20 gauge 3.5 inch Tuohy  Loss of Resistance Depth - 4.5 cm  Contrast Dye - iohexol 180 mgI/ml - 1 ml   Injected Steroid Solution: triamcinolone 40 mg and pf normal saline 2 ml  Additional Medications Administered: glycopyrrolate 0.2 mg for presyncope prophylaxis    Estimated Blood Loss - <2 ml  Drains: None  Specimens Removed: None  Urine Output - Not Measured  Complications: no apparent complications (feeling warm at the end of the procedure after needle was out, no diaphoresis, no bradycardia, maintained blood pressure)  Outcome: good    Informed Consent:  The patient's condition and proposed procedures, risks, and alternatives were discussed with the patient or responsible party.  The patient's / responsible party's questions were answered.   The patient / responsible party appeared to understand and chose to proceed.  Informed consent was obtained.    After obtaining written consent, an IV hep lock was placed. (See nurses documentation for details).   Procedure in Detail:  The patient was taken back to the OR fluoroscopy suite and placed in a prone position with a pillow under the chest to decrease lordosis.  The skin overlying the injection site was prepped and draped in an aseptic fashion. The target vertebral interspace (see above) was identified by AP fluoroscopy.     Procedural Pause:  A procedural pause verifying correct patient, medical record number, allergies, medications to be administered, current vital signs, and surgical site was performed immediately prior to beginning the procedure.  The skin and subcutaneous tissue overlying the target site of injection was anesthetized using 4 ml of 1% lidocaine MPF with a 25-gauge, 1 -inch  needle.   The above noted Tuohy needle was advanced under fluoroscopic guidance towards the epidural space. Lateral fluoroscopic imaging was used to confirm depth. The epidural space was identified using a loss of resistance to air technique. (See above for loss of resistance depth). A catheter was not placed. A microbore extension tubing was attached to the needle to minimize any movement of the needle during injection or syringe change.  After negative aspiration for heme or CSF, the above noted contrast was injected. An epidurogram was confirmed using AP and lateral fluoroscopy. After repeat negative aspiration for heme or CSF, the above noted steroid solution was slowly injected in increments. The needle was then retracted approximately halfway and the needle track was flushed with 0.5 ml of 1% lidocaine MPF to clear the needle prior to removal. The Tuohy needle was then removed.   The heart rate, pulse oximetry, and blood pressure were continuously monitored throughout the procedure (see Nursing Flowsheet for details).  The patient tolerated the procedure well..The patient was carefully escorted to the recovery room in stable condition.  Patient was monitored by RN for recovery period. After meeting discharge criteria, the patient was discharged with driver.     DISCUSSION:  Conditions of the spine that result in spinal nerve root irritation such as disc protrusions, spinal stenosis, or post surgical radiculitis often respond favorably to epidural steroid injections.  The goal in performing epidural steroid injections is to provide relief from pain and permit greater function.   Today we performed an interlaminar epidural steroid injection.  The patient was informed that it may take several days for the epidural steroid medication to reach its full efficacy and he should continue his regular pain medications as prescribed.  The patient was advised to relax and avoid any heavy lifting or excessive bending for  the rest of the day.   He was advised that he may return to his usual activities after 24 hours if he is otherwise feeling well.    The patient was advised not to bathe or soak in water for 24 hours but that showering would be acceptable.  The patient was instructed that if he experienced fever or chills, new weakness, new sensory changes, any changes in bowel or bladder habits, worsening back pain, new headache, neck stiffness, or other new symptoms, that he should contact the pain clinic immediately or dial 911 if unable to reach the pain clinic.  We recommended to the patient that he not travel out of the area for the next 4 days following the procedure so that he can be reevaluated if necessary.        RECOMMENDATIONS:  1. We will plan to have the patient follow up in 3 months for a repeat epidural steroid injection. If the patient is doing well prior to the next scheduled injection, the patient will contact us and reschedule to a later date..   2. No medications were prescribed at today's visit.  3. Additional recommendations: None       Informed Consent:  The patient's condition and proposed procedures, risks, and alternatives were discussed with the patient or responsible party.  The patient's / responsible party's questions were answered.   The patient / responsible party appeared to understand and chose to proceed.  Informed consent was obtained.  Procedural Pause:  A procedural pause verifying correct patient, medical record number, allergies, and surgical site was performed immediately prior to beginning the procedure.  The patient was instructed and educated on all aspects of the plan of care.  The patient acknowledged the plan of care.  I saw and evaluated the patient with my attending physician, Dr. Anabel Bene, MD  Fellow  Department of Anesthesiology & Pain Medicine    Note Electronically Signed By:  Kandace Blitz, MD

## 2020-05-09 NOTE — Progress Notes (Signed)
Patient WAS wearing a surgical mask  Droplet precautions were followed when caring for the patient.   PPE used by provider during encounter: Surgical mask       [facility]  Department of Anesthesiology and Pain Medicine  [address], [address]  [place], [place] [address]  Phone: [phone]    Fax: [phone]    Date: 05/09/2020    Re: Samuel Cobb  MR#: [id number]  Date of Birth: 10-28-1985  Age: 16yr    Procedure:  Epidural Steroid - Cervical    See Procedure Note for Discussion and Recommendations    Dear Dr. Janalyn Shy, Earvin Hansen, MD    We had the pleasure of treating your patient, Samuel Cobb, today at the [facility].  As you know, the patient is a 34yr -old male with a chief complaint of:    Chief Complaint   Patient presents with    Neck Pain    Arm Pain       Mr. Wilcock states that the pain problem started many year(s) ago.     He notes pain located in the areas indicated in the pain location diagram below.     Pain Location Diagram      VAS Pain Scale  (0-10) Current Pain Score     (0-10) Current Pain Score 8   Pain Location neck   Pain Side/ Orientation bilateral;upper     Pre-Procedural Screening  Pre-Procedure Screening Driver verified;NPO   NPO for Solids > or = 8 Yes   NPO for Liquids > or = 2  Yes   Anti-Coag (Y/N) No   Anti-Coag Med List     Anti-Coag Last Taken     Anti-Coag Last Time Taken     Anti-Coag Held within Parameters     NSAIDS (Y/N) No   NSAIDS Med List     NSAIDS Last Taken     NSAIDS Med Last Time      NSAIDS Held within Parameters     Pregnant (Y/N) No   Implantable Devices NONE   Recent illness < or = 7 days No   Rash (Y/N) No   Open Wounds (Y/N) No   Recent Antibiotics < or = 7 days (Y/N) No   Steroids < or = 1 month (Y/N) No   Recent Surgery < or = 3 months  No   Upcoming Surgery within next 3 months (Y/N) No        Mr. Lamos does not have a history of problems with anesthesia in the  past.   He was questioned regarding a history of stridor, snoring, or sleep apnea. Mr. Ashmead states that he has a history of neither stridor, snoring, nor sleep apnea.      RESULTS OF MOST RECENT PROCEDURE: Mr. Hartis did not undergo a recent procedure.Marland Kitchen NA    PREVIOUS DIAGNOSTIC STUDIES: The lab/imaging results below were reviewed.   MRI of cervical spine    Lab Results   Lab Name Value Date/Time    WBC 9.6 11/07/2019 03:57 PM    HGB 14.3 11/07/2019 03:57 PM    HCT 42.9 11/07/2019 03:57 PM    PLT 228 11/07/2019 03:57 PM       Lab Results   Lab Name Value Date/Time    NA 135 11/07/2019 03:57 PM    K 4.0 11/07/2019 03:57 PM    CL 99 11/07/2019 03:57 PM    CO2 25 11/07/2019 03:57  PM    BUN 7 (L) 11/07/2019 03:57 PM    CR 0.79 11/07/2019 03:57 PM    GLU 70 11/07/2019 03:57 PM       No results found for: PGLU    Lab Results   Lab Name Value Date/Time    AST 22 11/07/2019 03:57 PM    ALT 16 11/07/2019 03:57 PM    ALP 58 11/07/2019 03:57 PM    ALB 4.6 11/07/2019 03:57 PM    TP 7.7 11/07/2019 03:57 PM    TBIL 0.5 11/07/2019 03:57 PM         Outpatient Medications Marked as Taking for the 05/09/20 encounter (Office Visit) with Collene Gobble, MD   Medication Sig Dispense Refill    Buprenorphine (SUBUTEX) 2 mg Sublingual Tablet 1 TABLET UNDER THE TONGUE AND ALLOW TO DISSOLVE EVERY 12 HRS SUBLINGUAL 30 DAYS      Diazepam (VALIUM) 2 mg Tablet TAKE0.5  1 TABLET BY ORAL ROUTE 2 TIMES EVERY DAY AS NEEDED      folic acid 800 mcg Tablet       Glycopyrrolate (ROBINUL) 0.2 mg/mL Injection 1 mL by IV route every 5 minutes if needed (bradycardia, vagal reaction).  0    Midazolam (VERSED) 1 mg/mL Injection 0.5 - 2 mg IVP PRN anxiety, MR every 5 minutes. Maximum of 4 mg 2 mL 0    Naloxone (NARCAN) 4 mg/actuation Nasal Spray Use as needed for suspected opioid overdose. Call 911. Spray into one nostril upon   signs of opioid overdose.  Additional doses may be given every 2 to 3 minutes until emergency medical  assistance arrives.  Alternate nostrils with each dose.  Use each nasal spray only one time. 2 each 1    Promethazine (PHENERGAN) 25 mg Tablet Take 25 mg by mouth once daily if needed.         Allergies   Allergen Reactions    Regal-Am [Quinine Sulfate] Other-Reaction in Comments     shake    Toradol [Ketorolac Tromethamine] Other-Reaction in Comments     shake    Tramadol Other-Reaction in Comments     shake    Zoloft [Sertraline Hcl] Palpitations         Patient Active Problem List   Diagnosis    Chronic abdominal pain    Anxiety    Degeneration of cervical intervertebral disc       Past Medical History:   Diagnosis Date    Anxiety     Diverticulitis        Past Surgical History:   Procedure Laterality Date    EXPLORATORY LAPAROTOMY  2015    PR REMOVAL GALLBLADDER  2011    TONSILLECTOMY  1991       Family History   Problem Relation Name Age of Onset    Other (GI abnormality) Mother         Social History     Socioeconomic History    Marital status: SINGLE     Spouse name: Not on file    Number of children: Not on file    Years of education: Not on file    Highest education level: Not on file   Occupational History    Not on file   Tobacco Use    Smoking status: Never Smoker    Smokeless tobacco: Never Used   Substance and Sexual Activity    Alcohol use: Not Currently     Comment: stopped after gallbladder surgery  Drug use: Yes     Frequency: 3.0 times per week     Types: Marijuana    Sexual activity: Not on file   Other Topics Concern    Not on file   Social History Narrative    Not on file     Social Determinants of Health     Financial Resource Strain:     Difficulty of Paying Living Expenses: Not on file   Food Insecurity:     Worried About Running Out of Food in the Last Year: Not on file    Ran Out of Food in the Last Year: Not on file   Transportation Needs:     Lack of Transportation (Medical): Not on file    Lack of Transportation (Non-Medical): Not on file   Physical  Activity:     Days of Exercise per Week: Not on file    Minutes of Exercise per Session: Not on file   Stress:     Feeling of Stress : Not on file   Social Connections:     Frequency of Communication with Friends and Family: Not on file    Frequency of Social Gatherings with Friends and Family: Not on file    Attends Religious Services: Not on file    Active Member of Clubs or Organizations: Not on file    Attends Banker Meetings: Not on file    Marital Status: Not on file   Intimate Partner Violence:     Fear of Current or Ex-Partner: Not on file    Emotionally Abused: Not on file    Physically Abused: Not on file    Sexually Abused: Not on file       Mr. Lusignan's past medical, family, and social history were reviewed.    PHYSICAL EXAM:    Vitals:    05/09/20 1210   BP: 121/72   Pulse: 86   Resp: 16   Temp: 36.7 C (98 F)   TempSrc: Temporal   SpO2: 98%   Weight: 63.3 kg (139 lb 8.8 oz)   Height: 1.702 m (5\' 7" )     Body mass index is 21.86 kg/m.   Mallampati image  Constitutional: Normally developed, no deformities,well groomed.  Airway - Required for Procedures: Mallampati class 2, Oral Eval: Mouth opening normal, neck ROM  full, Thyroid-Mentum distance in fingerbreaths  3  Respiratory: clear to auscultation.  Cardiovascular:  normal rate and regular rhythm, no murmurs, clicks, or gallops.      ASA Status:  2 - Mild, controlled systemic disease and no functional limitations    Preprocedural Diagnosis    ICD-10-CM    1. Cervical radiculitis  M54.12 EPIDURAL STEROID INJECTIONS   2. Feeling anxious  R45.89 Midazolam (VERSED) 1 mg/mL Injection     Glycopyrrolate (ROBINUL) 0.2 mg/mL Injection        Orders Placed This Encounter    Midazolam (VERSED) 1 mg/mL Injection    Glycopyrrolate (ROBINUL) 0.2 mg/mL Injection       - The patient was instructed and educated on all aspects of the plan of care.  The patient acknowledged the plan of care.  - I saw and evaluated the patient with my  attending physician, Dr. .    Thedore Mins, MD  Fellow  Department of Anesthesiology & Pain Medicine      Mr. Mahone: If you are reviewing this progress note and have questions about the meaning or medical terms being used, please schedule an  appointment or bring it up at your next follow-up appointment.  Medical notes are a communication tool between medical professionals and require medical terms to be used for efficiency. You can also look up terms via on-line medical dictionaries such as Medline Plus at http://kim.org/.html

## 2020-05-09 NOTE — Progress Notes (Signed)
-   The patient was seen, evaluated, and care plan was developed with the pain fellow, Dr. Dhillon, on the date indicated in the Pain fellow's note. I agree with the findings and plan as outlined in the Pain fellow's note.    Informed Consent:  The patient's condition and proposed procedures, risks, and alternatives were discussed with the patient or responsible party.  The patient's / responsible party's questions were answered.   The patient / responsible party appeared to understand and chose to proceed.  Informed consent was obtained.    Procedural Pause:  A procedural pause verifying correct patient, medical record number, allergies, and surgical site was performed immediately prior to beginning the procedure.    I (Rhylie Stehr, MD) was physically present during key portion(s) of the procedure including the procedural pause and initiation of the procedure and during other times, was immediately available to return to the procedure on the date indicated in the pain fellow's, note.  Please see EMR note for details.    The patient was instructed and educated on all aspects of the plan of care. The patient acknowledged the plan of care.    Balen Woolum S Ziaire Bieser, MD  Department of Anesthesiology & Pain Medicine  Attending    Note Electronically Signed By:  Jaquana Geiger S Traylon Schimming, MD

## 2020-06-12 MED FILL — glycopyrrolate 0.2 mg/mL injection solution: INTRAMUSCULAR | Qty: 1 | Status: AC

## 2020-06-12 MED FILL — midazolam (PF) 1 mg/mL injection solution: INTRAMUSCULAR | Qty: 2 | Status: AC

## 2020-06-12 MED FILL — sodium chloride 0.9 % injection solution: INTRAMUSCULAR | Qty: 10 | Status: AC

## 2020-06-12 MED FILL — triamcinolone acetonide 40 mg/mL suspension for injection: INTRAMUSCULAR | Qty: 1 | Status: AC

## 2020-10-02 ENCOUNTER — Telehealth: Payer: Self-pay | Admitting: ANESTHESIOLOGISTS

## 2020-10-02 DIAGNOSIS — M5412 Radiculopathy, cervical region: Secondary | ICD-10-CM

## 2020-10-02 NOTE — Telephone Encounter (Signed)
Dear Dr.Singh:    Patient has sent a mychart schedule request see below.     Patient states:    Appointment Request From: [name]    With Provider: Lorn Junes, NP [Pain Clinic]    Preferred Date Range: 09/27/2020 - 10/25/2020    Preferred Times: Any Time    Reason for visit: Office Visit    Comments: What the next step is. My neck pain has increased       Action Requested: Please advise on scheduling.       Thank you,    Leilani Merl  PSR II  Pain management  [phone]

## 2020-10-02 NOTE — Telephone Encounter (Signed)
Left message to call back.     Please assess percentage and duration of pain relief from last procedure. Pt last seen 05/09/20 for CES.

## 2020-10-02 NOTE — Telephone Encounter (Signed)
Patient ID x 3. 50% pain relief for 1 month from last CES done 05/09/20 with NS and SD. Reports same symptoms have returned however they are increased and the past couple days feeling it more at the base of his skull.      Do you recommend repeating CES or do you want to schedule a follow up?    Message reviewed, determined further review/action is needed from Physician.

## 2020-10-02 NOTE — Telephone Encounter (Signed)
Patient returning call.    Please call patient back.      Thank you,    Kristan Brummitt  PSR II  Pain management  [phone]

## 2020-10-03 NOTE — Addendum Note (Signed)
Addended by: Leanne Lovely on: 10/03/2020 08:54 AM     Modules accepted: Orders

## 2020-10-03 NOTE — Telephone Encounter (Addendum)
Okay to repeat cervical epidural steroid injection.  Perhaps he will get more benefit with the second injection.  Also, schedule follow-up with ND or NS.  Procedure ordered.

## 2020-10-09 NOTE — Telephone Encounter (Signed)
Auth pending.

## 2020-10-10 NOTE — Telephone Encounter (Signed)
Authorization approved for: 1.) CES w/ fluoro w/ NS and  2.) FV w/ NS or Nav    Sent a mychart message to the patient to call to schedule.    SCHEDULING: please call patient to schedule.    Thank you,  Jamison Neighbor, PSR III   Pain Management   Patient line [phone]

## 2020-10-18 NOTE — Telephone Encounter (Signed)
Patient scheduled for procedure on 05/11. Patient refused anything earlier. Patient did not want to schedule follow up at this time, he states he will schedule after the procedure.     Simona Huh  Pain Management   [phone]

## 2020-10-22 ENCOUNTER — Encounter: Payer: Self-pay | Admitting: ANESTHESIOLOGISTS

## 2020-10-22 NOTE — Telephone Encounter (Signed)
Relayed message listed below. Patient stated it is very difficult for him to arrange transportation. Can follow up appointment be via Video Visit?     Thank you,   Derry Skill   Patient Service Representative II  [phone]

## 2020-10-22 NOTE — Telephone Encounter (Signed)
Left a message to call back.  MC message sent

## 2020-10-22 NOTE — Telephone Encounter (Addendum)
Replied to patient follow-up not for disability or light duty work evaluation or paperwork.  Patient previously declined follow up or sooner ESI (see 10/02/20 encounter).    Please offer VV follow up with Nav or NS per patient request prior to procedure 11/14/20.

## 2020-10-22 NOTE — Telephone Encounter (Signed)
From: [name]  To: Levonne Lapping, MD  Sent: 10/20/2020 6:19 PM PDT  Subject: Work limits     I've been having an increasingly hard time performing my duties at work. 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 It's been getting more difficult and the pain in my neck is not only increasing but is beginning to spread. I've asked to be put on light duty to take some of the pressure off my neck but they refuse to allow it without a note from my doctor. I was just wondering if there was any way one can be provided through here? I've been trying to schedule appointments with my primary but I can't get in there until next month when I also have an appointment with you. I don't know if my neck can take it until then and also don't want to have to miss work or exacerbate the current injury any further. It would do me a world of good if I can get put on light duty at work. Thank you.   Casimiro Needle

## 2020-10-22 NOTE — Telephone Encounter (Signed)
Patient is returning Nursing's call. Patient would like a call back at # [phone].     Notified patient that Nursing might call from a restricted, unknown or blocked number.     Thank you,  Derry Skill   [facility]

## 2020-10-22 NOTE — Telephone Encounter (Signed)
Dr. Thedore Mins: does patient need in person follow up OR can he do VV?    Thank you.    Bonne Dolores, RN-BC  [facility]  [phone]

## 2020-10-22 NOTE — Telephone Encounter (Signed)
Left message for patient to call back at [phone] (home) .  Pt could schedule a follow up with Dr Thedore Mins or Nav as per encounter on 3/29.  Pt is scheduled for a CES on 5/11.

## 2020-10-23 NOTE — Telephone Encounter (Signed)
Patient is scheduled for VV on 04/27    Thank you,    Reed Breech  Patient Services Rep II  Pain Management  [phone]

## 2020-10-30 NOTE — Progress Notes (Deleted)
Patient WAS wearing a surgical mask  Contact precautions were followed when caring for the patient.   PPE used by provider during encounter: Surgical mask and Gloves  ===================================================     [facility]  Department of Anesthesiology and Pain Medicine  [address], [address]  [place], [place] [address]  Phone: [phone]    Fax [phone]    Date: 10/31/2020    Re: Samuel Cobb  MR#: [id number]  Date of Birth: 1985/11/24  Age: 81yr    Requesting physician: Lyda Kalata, MD    Below is the summary of the visit with our mutual patient, Samuel Cobb. Thank you for your cooperation in his care.       Assessment and Differential Diagnosis:    ICD-10-CM    1. Cervical radiculitis  M54.12 Desipramine (NORPRAMIN) 10 mg Tablet     ECG 12 Lead with Rhythm Strip        Orders Placed This Encounter    Desipramine (NORPRAMIN) 10 mg Tablet            Encounter Summary:  Samuel Cobb is a 35yr -old male who  has a past medical history of Anxiety and Diverticulitis. AND  has a past surgical history that includes tonsillectomy (1991); pr removal gallbladder (2011); and exploratory laparotomy (2015).     Mr. Samuel Cobb was seen today in follow up at the [facility] for neck pain.   He underwent a cervical C7-T1 midline epidural steroid injection on 05/09/2020.     Patient was last seen in Pain Medicine clinic on 03/14/2020 and at that time recommended:   "The primary encounter diagnosis was Cervical radiculitis. Diagnoses of Cervical spondylosis, Cervical radiculopathy, Chronic, continuous use of opioids, and Benzodiazepine dependence (HCC) were also pertinent to this visit. For his radicular pain we will perform an ESI of the cervical spine to assist with radicular pain. If his pain does not improve, we would consider referral back to Dr. Omar Person for a surgical intervention. At this time we do not recommend a medial branch ablation of the cervical facets, even though is  pain also facet mediated, due to his age, but we can consider performing medial branch diagnostic blocks with steroids.  Risks and benefits of both procedures discussed with the patient, the patient agrees to the above plan and elects for an epidural steroid injection of the cervical spine.      Additionally, He may discuss the trial of neuropathic pain agents with his primary care doctor and psychiatrist, risks and benefits of these medications discussed with the patient briefly.  We informed him that he should be on an effective dose of neuropathic pain medications to see benefit and it appears that he was likely on low-dose of either gabapentin or pregabalin.  In addition, a tricyclic antidepressant that is more activating such as desipramine can be considered.  This would need to be coordinated with his primary care physician and psychiatrist.  Lastly, he should try to limit use of benzodiazepines with opiate medications although buprenorphine is thought to have a ceiling effect for respiratory depression.  He does have Narcan at home.    The patient needs a gentle steady guided re-conditioning program to address deconditioning and ongoing muscle tension with gentle gradual aerobic,strengthand flexibility exercise with transition to ongoing daily exercise plan.  The injection therapy is part of a comprehensive treatment plan which includes, cognitive restructuring, Acupuncture and exercise."    Patient was also seen by Neurosurgery  on 09/13/2019 and at that time recommended:   Samuel Cobb is a 35yr old male with cervicalgia and right shoulder pain for 10+ years.  Patient states that his pain has been getting slightly worse with no numbness and tingling in the morning after trigger point injections.  His MRI shows some cervical stenosis at C5-7 this would not likely cause the symptoms that he is experiencing.  We therefore recommend x-rays of his shoulder and then follow-up with physical therapy for  shoulder strengthening.  Should this not work we recommend potential referral for pain management for further investigation of his shoulder pain.    Recommendations:  1) Shoulder xrays  2) Additional shoulder imaging as needed  3) Physical therapy for shoulder pain  4) Pain management referral if PT doesn't help    Today, patient reports neck pain that has been worsening and radiating up his neck. He reports that the previous cervical epidural steroid injection gave him relief for one month. Notes that is scheduled for another cervical epidural steroid injection in May 2022 but may reschedule if cannot get transportation. He also reports that he not believe that his neck pain is related to his shoulder as his neck pain starts at the base of the skull. Notes that he get tingling and numbness in right arm and hand when using right arm. Reports that Subutex provides relief to get him through work.     Current treatments:  Subutex 2 mg sublingual every 12 hours     Prior treatments:       On physical exam, point t pain in neck and trapezui region and right arm.    On review of recent imaging, patient had MRI of cervical spine on 02/20/2020 shows mild progression of degenerative changes resulting in moderate spinal and moderately severe bilateral neuroforaminal stenosis at C5-6 and C6-7.      The encounter diagnosis was Cervical radiculitis. Treatment options as well as risks and benefits were discussed with patient. Recommend starting   And recommend completing EKG with starting new medication and can be completed before cervical epidural steroid injection. At this time we also recommend completing cervical epidural steroid injection scheduled in May 2022 and we will reassess his pain after. If patient does not get relief from the next cervical epidural steroid injection, consider following up with Dr. Omar Person for neck pain.         RECOMMENDATIONS/TREATMENT PLAN:    Please type in the dot phrase .DIAGMED only AFTER you  have entered all of the pertinent diagnoses. Since this dot phrase is NOT refreshable, you will need to manually enter any diagnosis added after-the-fact.    DISPOSITION AFTER TODAY'S VISIT:   PRIORITY SCHEDULING:  covid pain priority scheduling:20684   ATTENDING: Dr.  Riki Altes PAIN MANAGEMENT PHYSICIAN"   PROCEDURE: ***   SIDE(S):  covid pain side:20688   IMAGING:  Imaging:20685   STEROID:  yes/no:15035   IV ANXIOLYSIS:  yes/no/unknown:[id number]   DRIVER:  SPQ/ZR:00762   SPECIAL INSTRUCTIONS:  covid pain special instructions:20686::"no special instructions"     E-CONSENT:  completed or not completed:23691::"completed today."   PROCEDURE ORDER:  completed or not completed:23691::"completed today."      Dear Dr. Janalyn Shy, Earvin Hansen, MD    It was a pleasure to see your patient, Samuel Cobb, today in follow up at the [facility] [place] [facility] for Pain Medicine.  As you know, Samuel Cobb is a 35yr -old male with a chief complaint of:  Chief Complaint   Patient presents with    Neck Pain         Samuel Cobb is here for  OPMED Presents for:10550  LOCATION:12747 pain, which started  NUMBERS:14192  Time Units:10300 ago. He associates the onset of pain with  Lumbosacral Injury:13964.    Samuel Cobb notes pain located in the areas indicated in the pain location diagram below.     Pain Location Diagram  No images are attached to the encounter.    ALLERGIES:    Allergies   Allergen Reactions    Regal-Am [Quinine Sulfate] Other-Reaction in Comments     shake    Toradol [Ketorolac Tromethamine] Other-Reaction in Comments     shake    Tramadol Other-Reaction in Comments     shake    Zoloft [Sertraline Hcl] Palpitations       CURRENT MEDICATIONS:     Outpatient Medications Marked as Taking for the 10/31/20 encounter (Telemedicine Scheduled) with Collene Gobble, MD   Medication Sig  Dispense Refill    Desipramine (NORPRAMIN) 10 mg Tablet Take 1 tablet by mouth every day at bedtime. 30 tablet 11       STOPPED PAIN MEDICATIONS:    Stopped Meds:13962::"None"     Insert MyChart Pt Questionnaire Here By Typing .pqr.  If the patient filled out the hardcopy MyChart Questionnaire type in .EMRMYCHARTINPUT and select from the drop down menus    Past Medical History:   Diagnosis Date    Anxiety     Diverticulitis        Past Surgical History:   Procedure Laterality Date    EXPLORATORY LAPAROTOMY  2015    PR REMOVAL GALLBLADDER  2011    TONSILLECTOMY  1991       Family History   Problem Relation Name Age of Onset    Other (GI abnormality) Mother           PSYCHOSOCIAL HISTORY:     Social History     Socioeconomic History    Marital status: SINGLE   Tobacco Use    Smoking status: Never Smoker    Smokeless tobacco: Never Used   Substance and Sexual Activity    Alcohol use: Not Currently     Comment: stopped after gallbladder surgery    Drug use: Yes     Frequency: 3.0 times per week     Types: Marijuana     The patient  reports that he has never smoked. He has never used smokeless tobacco.    Samuel Cobb past medical, family, and social history were reviewed.    PROBLEM LIST:  Patient Active Problem List   Diagnosis    Chronic abdominal pain    Anxiety    Degeneration of cervical intervertebral disc       PREVIOUS DIAGNOSTIC STUDIES:   Images: The report of the image indicated below was reviewed.   X-ray of cervical spine and shoulder and MRI of cervical spine    MRI C-spine: 02/20/2020  MR C-SPINE WITHOUT CONTRAST  EXAM DATE:  02/20/2020 11:34 AM.  COMPARISON: Radiographs of the cervical spine 04/14/2019, MRI cervical spine  12/31/2018    INDICATION: neck pain;Worsening of neck pain and arm numbness.    TECHNIQUE: Routine spine without protocol. Precontrast sagittal T1, T2 FSE,  STIR, axial T1 and T2.    FINDINGS:    Alignment: Straightened normal cervical lordosis which could reflect  muscle  spasm or could be positional.    Vertebral body heights  and marrow: Multilevel spondylosis with osteophytes,  facet and uncovertebral arthropathy, as well as endplate marrow  degenerative changes are seen. Otherwise the vertebral body heights and  marrow signal are unremarkable.  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    Spinal Cord:The craniocervical junction is unremarkable. The spinal cord is  normal.    Soft tissues: Normal.     At C2-3: Unremarkable.    At C3-4:Unremarkable.    At C4-5:Disc bulge and uncovertebral hypertrophy, causing mild narrowing of  the thecal sac, similar to previous exam.    At C5-6:Disc bulge with annular tear and uncovertebral hypertrophy, causing  moderate spinal stenosis. Moderately severe bilateral neuroforaminal  narrowing, slightly increased from prior exam.    At C6-7:Disc bulge with annular tear and uncovertebral hypertrophy, causing  moderate spinal stenosis and moderately severe bilateral neuroforaminal  narrowing.    At C7-T1:Unremarkable.    IMPRESSION:    1. 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      XR Shoulder: 09/13/2019  SHOULDER 3+ VIEWS RIGHT  EXAM DATE: 09/13/2019 4:06 PM  COMPARISON: None    INDICATION: Signs/Symptoms:  Right shoulder pain    TECHNIQUE: AP, Grashey, axillary view of the right shoulder    FINDINGS:  No acute fracture or dislocation. Shoulder alignment is intact. Joint  spaces are maintained. Visualized lung fields and soft tissues are  unremarkable.        IMPRESSION:  1. No acute osseous abnormality.        XR C-spine: 04/14/2019  C-SPINE 4+ VIEWS  EXAM DATE: 04/14/2019 10:11 AM  COMPARISON: MRI 2020.    INDICATION: Signs/Symptoms: neck pain    TECHNIQUE: AP and lateral neutral, flexion, extension views of the cervical  spine.    FINDINGS:    Alignment:  There is normal alignment of the spine.    Vertebrae: No fractures or destructive changes. There is continued anterior  interbody spurring C4-5. No pathological movement is seen from flexion to  extension.    Prevertebral and paraspinal soft tissues: within normal limits.    IMPRESSION:    1. Continued degenerative spurs C4-5 without pathological movement.         Labs: The lab/study results below were reviewed.    Lab Results   Lab Name Value Date/Time    WBC 9.6 11/07/2019 03:57 PM    HGB 14.3 11/07/2019 03:57 PM    HCT 42.9 11/07/2019 03:57 PM    PLT 228 11/07/2019 03:57 PM       Lab Results   Lab Name Value Date/Time    NA 135 11/07/2019 03:57 PM    K 4.0 11/07/2019 03:57 PM    CL 99 11/07/2019 03:57 PM    CO2 25 11/07/2019 03:57 PM    BUN 7 (L) 11/07/2019 03:57 PM    CR 0.79 11/07/2019 03:57 PM    GLU 70 11/07/2019 03:57 PM       No results found for: PGLU    Lab Results   Lab Name Value Date/Time    AST 22 11/07/2019 03:57 PM    ALT 16 11/07/2019 03:57 PM    ALP 58 11/07/2019 03:57 PM    ALB 4.6 11/07/2019 03:57 PM    TP 7.7 11/07/2019 03:57 PM    TBIL 0.5 11/07/2019 03:57 PM       No results found for: HGBA1C    No results found  for: A1CPOC    No results found for: INR    No results found for: INR    No results found for: BNP    No components found for: TROPONIN1    Urine Toxicology  No results found for: DRUGGCSCRNU  No results found for: AMPHETSCRNU  No results found for: BENZOSSCRNU  No results found for: COCAINESCRNU  No results found for: BARBSSCRNU      RESULTS OF MOST RECENT PROCEDURE: Samuel Cobb  RECENT PROCEDURE:12797.  Length of ZOXWRU:04540    PHYSICAL EXAM:    There were no vitals filed for this visit.  There is no height or weight on file to calculate BMI.   Constitutional: Normally developed, no deformities,well groomed., healthy, alert, no distress, pleasant affect, cooperative, skin warm, dry, and pink    Musculoskeletal:  MUSCULOSKELETAL:12751     Motor: Movement  Assessment Upper Limb  Bulk:   normal  Tone:  normal  Abnormal Movements:  no    Strength Movement Root Nerve Muscle   Bilateral 5 Shoulder abduction C5/6 Axillary Deltoid   Bilateral 5 Elbow flexion C5/6 Musculocut., Radial Biceps, Brachioradialis   Bilateral 5 Elbow extension C6/7/8 Radial Triceps   Bilateral 5 Wrist extension C5/6 Radial Ext. carpi rad. longus   Bilateral 5 Finger extension C7/8 Post. Interos. N. (radial) Ext. dig. comm.   Bilateral 5 Finger flexion (index) C7/8 Ant. Interos. N. (median) Flexor dig. Prof. (index)   Bilateral 5 Finger flexion (ring, little) C7/8 Ulnar Flex. dig. prof. (ring + little)   Bilateral 5 Finger abduction C8/T1 Ulnar 1st dorsal interos.   Bilateral 5 Thumb abduction C8/T1 Median ABD. Pol. brevis     Neuro:   Upper extremity reflexes: biceps -  REFLEXES RIGHT LEFT:12791+  brachioradialis -  REFLEXES RIGHT LEFT:12791+  triceps -  REFLEXES RIGHT JWJX:91478+  Hoffman's sign  IS/IS NOT:9024::"is not" present  left right:12743; Wrist clonus  IS/IS NOT:9024::"is not" present  left right:12743.  Sensory exam: sensation   WAS/WAS NOT:9033  DERMATOMES:12756::"intact and symmetrical" to light touch in the  Right/Left/Bilateral:14168 *** dermatomes.    Cervical Spine:  range of motion is  Range of motion:12770 with flexion and is associated with  pain:12772 in pain.  range of motion is  Range of motion:12770 with extension and is associated with  pain:12772 in pain.  range of motion is  Range of motion:12770 with oblique extension and is associated with  pain:12772 in pain.    Spurling's manuever on the  [facility] PAIN LEFT GNFAO:13086 cause  cause:12773    Myofascial exam: The patient  did/did not:12785 have myofascial tenderness in the *** muscle regions. There  were/ were no :12779 trigger points.    Movement Assessment Lower limb  Bulk:  normal  Tone:   normal  Abnormal Movements:  no    Strength Movement Root Nerve Muscle   Bilateral 5 Hip flexion L1/2/3 Femoral  and Spinal N. Iliopsoas   Bilateral 5 Hip adduction L2/3/4 Obturator Adductors   Bilateral 5 Knee flexion L5/S1/S2 Sciatic Hamstrings   Bilateral 5 Knee extension L2/3/4 Femoral Quadriceps   Bilateral 5 Ankle dorsiflexion L4/5 Deep peroneal Tibialis anterior   Bilateral 5 Ankle inversion L4/5 Tibial Nerve Tibialis Posterior   Bilateral 5 Ankle eversion L5/S1 Superficial peroneal Peronei   Bilateral 5 Ankle plantarflexion S1/S2 Tibial Gastrocnemius, soleus   Bilateral 5 Big toe extension L5/S1 Deep peroneal Extensor hallucis longus     Neuro:   Lower extremity reflexes:    patella -  REFLEXES  RIGHT ZOXW:96045+LEFT:12791+,    achilles -  REFLEXES RIGHT LEFT:12791+  Babinski- Toes are  TOES:12752::"downgoing"  left right:12743  Ankle clonus  IS/IS NOT:9024::"is not" present  left WUJWJ:19147right:12743   Sensory exam: sensation   WAS/WAS NOT:9033  DERMATOMES:12756::"intact and symmetrical" to light touch in the  Right/Left/Bilateral:14168 legs    Lumbar Spine:  range of motion is  Range of motion:12770 with rotation and is associated with  pain:12772 in pain.  range of motion is  Range of motion:12770 with extension and is associated with  pain:12772 in pain.  range of motion is  Range of motion:12770 with oblique extension and is associated with  pain:12772 in pain.  Supine SLR  seated WGN:56213SLR:12774.    Sacroiliac Joint Exam:   DISTRACTION:    NEG YQM:57846POS:13438           MODIFIED GAENSLEN'S MANEUVER:   Right  POSITIVE-NEGATIVE:12811::"negative", Left  POSITIVE-NEGATIVE:12811::"negative"   FLEXION, ABDUCTION, EXTERNAL ROTATION (FABER or PATRICK'S TEST):   Right  POSITIVE-NEGATIVE:12811::"negative", Left  POSITIVE-NEGATIVE:12811::"negative"   THIGH THRUST:  Right  POSITIVE-NEGATIVE:12811::"negative", Left  POSITIVE-NEGATIVE:12811::"negative"   COMPRESSION TEST:   NEG NGE:95284POS:13438   YEOMAN'S TEST    NEG XLK:44010POS:13438   PALPATION OF posterior superior iliac spine  Right  POSITIVE-NEGATIVE:12811::"negative", Left   POSITIVE-NEGATIVE:12811::"negative"        Myofascial exam: The patient  did/did not:12785 have myofascial tenderness in the *** muscle regions. There  were/ were no :12779 trigger points.    Psych:  Appearance/Cooperation: in no apparent distress, well developed and well nourished, non-toxic, in no respiratory distress and acyanotic, alert, oriented times 3, well groomed and dressed, cooperative and well hydrated  Attitude: pleasant   Behavior :normal  Eye Contact: normal  Attention Span: good  Speech: normal volume, rate, and pitch  Thought Process: logical and goal directed  Thought Content: no thought disorder or psychotic symptoms  Memory: good  Judgment: good  Relatedness: good    MEDICAL DECISION MAKING     Records Reviewed:   Lab/study reports reviewed above were important and necessary because subsequent medical and treatment recommendations required review of the above lab/study reports and no contraindications to proceeding with treatment were identified.  (See encounter summary and plan for specifics)  Images viewed/reviewed above were important and necessary because subsequent medical and treatment recommendations required review of the above image(s) and no contraindications to proceeding with treatment were identified.  (See encounter summary and plan for specifics)  The report by Pain Management dated 03/14/2020 was reviewed.  This was important and useful because the reviewed note clarified the reasons for the recommended treatment, subsequent medical recommendations were based upon the record review, and the patient's history was corroborated in the reviewed note.     Review of the Risk of Comorbidities:   PAIN REVIEW OF COMORBITIES:12088    ASSESSMENT AND RECOMMENDATIONS/TREATMENT PLAN:   Please see the beginning of the note.        closing and work comp [employer]language:19359    Ettel Albergo, SCRIBE  SCRIBE DISCLAIMER:   This note was scribed by Jac CanavanSamantha Zainab Crumrine, SCRIBE, a trained medical scribe  in the presence of Leanne LovelyNaileshni Singh, MD.   Electronically signed by Jac Canavan- Kristene Liberati, SCRIBE, 10/30/2020  8:40 AM    PROVIDER DISCLAIMER:   This document serves as my personal record of services taken in my presence. It was created on 10/30/2020 on my behalf by the trained medical scribe listed above.      I have reviewed this document and agree that  this note accurately reflects the history and exam findings, the patient care provided, and my medical decision making.    *** Fellow time: *** and Attending time: ***    Samuel Cobb: If you are reviewing this progress note and have questions about the meaning or medical terms being used, please schedule an appointment or bring it up at your next follow-up appointment.  Medical notes are a communication tool between medical professionals and require medical terms to be used for efficiency. You can also look up terms via on-line medical dictionaries such as Medline Plus at MuscleTreatments.it.html

## 2020-10-31 ENCOUNTER — Encounter: Payer: Self-pay | Admitting: ANESTHESIOLOGISTS

## 2020-10-31 ENCOUNTER — Ambulatory Visit: Payer: Medicaid (Managed Care) | Admitting: ANESTHESIOLOGISTS

## 2020-10-31 DIAGNOSIS — F1190 Opioid use, unspecified, uncomplicated: Secondary | ICD-10-CM

## 2020-10-31 DIAGNOSIS — M5412 Radiculopathy, cervical region: Secondary | ICD-10-CM

## 2020-10-31 MED ORDER — DESIPRAMINE 10 MG TABLET
10.0000 mg | ORAL_TABLET | Freq: Every day | ORAL | 11 refills | Status: AC
Start: 2020-10-31 — End: 2022-12-22

## 2020-10-31 NOTE — Patient Instructions (Signed)
desipramine  Pronunciation: des IP ra meen   Brand: Norpramin   What is the most important information I should know about desipramine?   Do not use this medication if you have recently had a heart attack, or if you have used an MAO inhibitor such as isocarboxazid (Marplan), phenelzine (Nardil), rasagiline (Azilect), selegiline (Eldepryl, Emsam), or tranylcypromine (Parnate) within the past 14 days.   You may have thoughts about suicide when you first start taking an antidepressant, especially if you are younger than 34 years old. Your doctor will need to check you at regular visits for at least the first 12 weeks of treatment.   Call your doctor at once if you have any new or worsening symptoms such as: mood or behavior changes, anxiety, panic attacks, trouble sleeping, or if you feel impulsive, irritable, agitated, hostile, aggressive, restless, hyperactive (mentally or physically), more depressed, or have thoughts about suicide or hurting yourself.   What is desipramine?  Desipramine is a tricyclic antidepressant. Desipramine affects chemicals in the brain that may become unbalanced.  Desipramine is used to treat symptoms of depression.  Desipramine may also be used for other purposes not listed in this medication guide.  What should I discuss with my healthcare provider before taking desipramine?   Do not use this medication if you are allergic to desipramine, or if you have recently had a heart attack.    Do not use desipramine if you have used an MAO inhibitor such as isocarboxazid (Marplan), phenelzine (Nardil), rasagiline (Azilect), selegiline (Eldepryl, Emsam), or tranylcypromine (Parnate) within the past 14 days. Serious, life-threatening side effects can occur if you take desipramine before the MAO inhibitor has cleared from your body.   If you have any of these other conditions, you may need a dose adjustment or special tests to safely take desipramine:   heart disease, or a history of heart attack,  stroke, or seizures;   a family history of sudden death related to a heart rhythm disorder;   bipolar disorder (manic-depression);   schizophrenia or other mental illness;   liver disease;   overactive thyroid;   diabetes (desipramine may raise or lower blood sugar);   glaucoma; or   problems with urination.  You may have thoughts about suicide when you first start taking an antidepressant, especially if you are younger than 34 years old. Tell your doctor if you have worsening symptoms of depression or suicidal thoughts during the first several weeks of treatment, or whenever your dose is changed.  Your family or other caregivers should also be alert to changes in your mood or symptoms. Your doctor will need to check you at regular visits for at least the first 12 weeks of treatment.   Desipramine may be harmful to an unborn baby. Tell your doctor if you are pregnant or plan to become pregnant during treatment.    It is not known whether desipramine passes into breast milk or if it could harm a nursing baby. Do not use this medication without telling your doctor if you are breast-feeding a baby.   Older adults may be more likely to have side effects from this medication.   Do not give desipramine to anyone under 34 years old without the advice of a doctor.   How should I take desipramine?  Take this medication exactly as it was prescribed for you. Do not take the medication in larger amounts, or take it for longer than recommended by your doctor. Your doctor may occasionally change your  dose to make sure you get the best results from this medication. Follow the directions on your prescription label.  If you need to have any type of surgery, tell the surgeon ahead of time that you are taking desipramine. You may need to stop using the medicine for a short time.   Do not stop using desipramine without first talking to your doctor. You may need to use less and less before you stop the medication completely.  Stopping this medication suddenly could cause you to have unpleasant side effects.    It may take a few weeks of using this medicine before your symptoms improve. For best results, keep using the medication as directed. Talk with your doctor if your symptoms do not improve during treatment.    Store desipramine at room temperature away from moisture and heat.   What happens if I miss a dose?  Take the missed dose as soon as you remember. If it is almost time for your next dose, skip the missed dose and take the medicine at the next regularly scheduled time. Do not take extra medicine to make up the missed dose.  What happens if I overdose?   Seek emergency medical attention if you think you have used too much of this medicine. An overdose of desipramine can be fatal. Symptoms may include uneven heartbeats, extreme drowsiness, vomiting, blurred vision, confusion, hallucinations, muscle stiffness, feeling hot or cold, seizure (convulsions), or coma.   What should I avoid while taking desipramine?   Avoid drinking alcohol. It can cause dangerous side effects when taken together with desipramine.   Grapefruit and grapefruit juice may interact with desipramine. Discuss the use of grapefruit products with your doctor before increasing or decreasing the amount of grapefruit products in your diet.   Desipramine can cause side effects that may impair your thinking or reactions. Be careful if you drive or do anything that requires you to be awake and alert.    Avoid exposure to sunlight or artificial UV rays (sunlamps or tanning beds). Desipramine can make your skin more sensitive to sunlight and sunburn may result. Use a sunscreen (minimum SPF 15) and wear protective clothing if you must be out in the sun.   What are the possible side effects of desipramine?   Get emergency medical help if you have any of these signs of an allergic reaction: hives; difficulty breathing; swelling of your face, lips, tongue, or throat.   Call  your doctor at once if you have any new or worsening symptoms such as: mood or behavior changes, anxiety, panic attacks, trouble sleeping, or if you feel impulsive, irritable, agitated, hostile, aggressive, restless, hyperactive (mentally or physically), more depressed, or have thoughts about suicide or hurting yourself.   Call your doctor at once if you have any of these serious side effects:    fast, pounding, or uneven heart rate;   seizure (convulsions);   chest pain or heavy feeling, pain spreading to the arm or shoulder, nausea, general ill feeling;   sudden numbness or weakness, especially on one side of the body;   sudden headache, problems with vision, speech, or balance;   easy bruising or bleeding, unusual weakness;   tremors, restless muscle movements in your eyes, tongue, jaw, or neck;   very stiff (rigid) muscles, high fever, sweating, confusion, tremors, feeling like you might pass out;   urinating less than usual or not at all;   extreme thirst with headache, nausea, vomiting, and weakness;   skin  rash, severe tingling or numbness, pain and muscle weakness; or   nausea, stomach pain, low fever, loss of appetite, dark urine, clay-colored stools, jaundice (yellowing of the skin or eyes).  Less serious side effects may include:   vomiting, constipation;   dry mouth, unpleasant taste;   weakness, lack of coordination;   feeling anxious, restless, dizzy, or drowsy;   sleep problems (insomnia), nightmares;   blurred vision, trouble concentrating, headache, ringing in your ears;   breast swelling (in men or women); or   decreased sex drive, impotence, or difficulty having an orgasm.  This is not a complete list of side effects and others may occur. Call your doctor for medical advice about side effects. You may report side effects to FDA at 1-800-FDA-1088.  What other drugs will affect desipramine?   Cold or allergy medicine, sedatives, narcotic pain medicine, sleeping pills, muscle  relaxers, and medicine for seizures or anxiety can add to sleepiness caused by desipramine. Tell your doctor if you regularly use any of these medicines, or any other antidepressants.   Before taking desipramine, tell your doctor if you have used an "SSRI" antidepressant in the past 5 weeks, such as citalopram (Celexa), escitalopram (Lexapro), fluoxetine (Prozac, Sarafem), fluvoxamine (Luvox), paroxetine (Paxil), or sertraline (Zoloft).  Tell your doctor about all other medicines you use, especially:   cimetidine (Tagamet); or   heart rhythm medications such as flecainide (Tambocor), propafenone (Rhythmol), or quinidine (Cardioquin, Quinidex, Quinaglute).  This list is not complete and there are many other medicines that can interact with desipramine.  Tell your doctor about all the prescription and over-the-counter medications you use. This includes vitamins, minerals, herbal products, and drugs prescribed by other doctors. Do not start using a new medication without telling your doctor. Keep a list with you of all the medicines you use and show this list to any doctor or other healthcare provider who treats you.  Where can I get more information?  Your pharmacist can provide more information about desipramine.    Remember, keep this and all other medicines out of the reach of children, never share your medicines with others, and use this medication only for the indication prescribed.  Every effort has been made to ensure that the information provided by [employer]') is accurate, up-to-date, and complete, but no guarantee is made to that effect. Drug information contained herein may be time sensitive. [employer] information has been compiled for use by healthcare practitioners and consumers in the [place] and therefore [employer] does not warrant that uses outside of the [place] are appropriate, unless specifically indicated otherwise. [employer]'s drug information does not endorse drugs,  diagnose patients or recommend therapy. [employer]'s drug information is an [employer] corporate to assist licensed healthcare practitioners in caring for their patients and/or to serve consumers viewing this service as a supplement to, and not a substitute for, the expertise, skill, knowledge and judgment of healthcare practitioners. The absence of a warning for a given drug or drug combination in no way should be construed to indicate that the drug or drug combination is safe, effective or appropriate for any given patient. [employer] does not assume any responsibility for any aspect of healthcare administered with the aid of information [employer] provides. The information contained herein is not intended to cover all possible uses, directions, precautions, warnings, drug interactions, allergic reactions, or adverse effects. If you have questions about the drugs you are taking, check with your doctor, nurse or pharmacist.  Copyright [phone] [employer] Version: 9.02. Revision date: 06/20/2009.  This information does not replace the advice of a doctor. [employer] disclaims any warranty or liability for your use of this information.   Content Version: 9.7.145117

## 2020-10-31 NOTE — Progress Notes (Signed)
VIDEO Visit Note     This clinical encounter was performed by utilizing a real time VIDEO  connection between my location and Samuel Cobb's location. Samuel Cobb location was confirmed during this visit. Samuel Cobb was advised that if he does not use a Wi-Fi connection for today's visit, then he is responsible for any carrier charges for standard message rates and data charges. I obtained verbal consent from Samuel Cobb to perform this clinical encounter utilizing the above technological connection and prepared Samuel Cobb by answering any questions he had about the tele-health interaction.       [facility]  Department of Anesthesiology and Pain Medicine  [address]  [place], [place] [address]  Phone: [phone]    Fax: [phone]    Date: 10/31/2020    Re: Samuel Cobb  MR#: [id number]  Date of Birth: September 14, 1985  Age: 26yr    Requesting physician: Lyda Kalata, Samuel Cobb    Below is the summary of the visit with our mutual patient, Samuel Cobb. Thank you for your cooperation in his care.          Assessment and Differential Diagnosis:    ICD-10-CM    1. Cervical radiculitis  M54.12 Desipramine (NORPRAMIN) 10 mg Tablet     ECG 12 Lead with Rhythm Strip   2. Cervical radiculopathy  M54.12    3. Chronic, continuous use of opioids  F11.90         Orders Placed This Encounter    Desipramine (NORPRAMIN) 10 mg Tablet             Encounter Summary:  Samuel Cobb is a 35yr -old male who  has a past medical history of Anxiety and Diverticulitis. AND  has a past surgical history that includes tonsillectomy (1991); pr removal gallbladder (2011); and exploratory laparotomy (2015)..    Samuel Cobb was seen today in telemedicine follow-up at the [facility] for neck pain.   He underwent a cervical C7-T1 midline epidural steroid injection on 05/09/2020.     Patient was last seen in Pain Medicine clinic on 03/14/2020 and at that time recommended:   "The  primary encounter diagnosis was Cervical radiculitis. Diagnoses of Cervical spondylosis, Cervical radiculopathy, Chronic, continuous use of opioids, and Benzodiazepine dependence (HCC) were also pertinent to this visit. For his radicular pain we will perform an ESI of the cervical spine to assist with radicular pain. If his pain does not improve, we would consider referral back to Dr. Omar Person for a surgical intervention. At this time we do not recommend a medial branch ablation of the cervical facets, even though is pain also facet mediated, due to his age, but we can consider performing medial branch diagnostic blocks with steroids.  Risks and benefits of both procedures discussed with the patient, the patient agrees to the above plan and elects for an epidural steroid injection of the cervical spine.      Additionally, He may discuss the trial of neuropathic pain agents with his primary care doctor and psychiatrist, risks and benefits of these medications discussed with the patient briefly.  We informed him that he should be on an effective dose of neuropathic pain medications to see benefit and it appears that he was likely on low-dose of either gabapentin or pregabalin.  In addition, a tricyclic antidepressant that is more activating such as desipramine can be considered.  This would need to be coordinated with his primary care physician and psychiatrist.  Lastly, he should try to limit use of benzodiazepines with opiate medications although buprenorphine is thought to have a ceiling effect for respiratory depression.  He does have Narcan at home.    The patient needs a gentle steady guided re-conditioning program to address deconditioning and ongoing muscle tension with gentle gradual aerobic,strengthand flexibility exercise with transition to ongoing daily exercise plan.  The injection therapy is part of a comprehensive treatment plan which includes, cognitive restructuring, Acupuncture and  exercise."    Patient was also seen by Neurosurgery on 09/13/2019 and at that time recommended:   Samuel Cobb is a 35yr old male with cervicalgia and right shoulder pain for 10+ years.  Patient states that his pain has been getting slightly worse with no numbness and tingling in the morning after trigger point injections.  His MRI shows some cervical stenosis at C5-7 this would not likely cause the symptoms that he is experiencing.  We therefore recommend x-rays of his shoulder and then follow-up with physical therapy for shoulder strengthening.  Should this not work we recommend potential referral for pain management for further investigation of his shoulder pain.    Recommendations:  1) Shoulder xrays  2) Additional shoulder imaging as needed  3) Physical therapy for shoulder pain  4) Pain management referral if PT doesn't help    Today, patient reports neck pain that has been worsening and radiating up his neck, mostly on the right side. He reports that the previous cervical epidural steroid injection gave him relief for one month however his pain has been worsening. Notes that he is scheduled for another cervical epidural steroid injection in May 2022 but may reschedule if cannot get transportation. He also reports that he does not believe that his neck pain is related to his shoulder as his neck pain starts at the base of the skull. Notes that he started experiencing tingling and numbness in his right arm as wells when using or laying on his right arm. Reports that Subutex provides enough relief to get him through work. Also notes that he has not seen his PCP as his prior one retired and has not gotten set up with a new PCP.     Current treatments:  Subutex 2 mg sublingual every 12 hours     On physical exam, patient pointed to pain in neck region, trapezius muscle  region and right arm.    On review of recent imaging, patient had MRI of cervical spine on 02/20/2020 shows mild progression of degenerative  changes resulting in moderate spinal and moderately severe bilateral neuroforaminal stenosis at C5-6 and C6-7.    The primary encounter diagnosis was Cervical radiculitis. Diagnoses of Cervical radiculopathy and Chronic, continuous use of opioids were also pertinent to this visit. Treatment options as well as risks and benefits were discussed with patient. Recommend starting Desipramine 10 mg at night and also recommend completing EKG when starting this new medication and can be completed on the day that he comes in for the cervical epidural steroid injection, since he lives far away. At this time we also recommend completing cervical epidural steroid injection scheduled in May 2022 and we will reassess his pain after. If patient does not get relief from the next cervical epidural steroid injection, consider following up with Dr. Omar Person for neck pain. Also recommend establishing with a new PCP.        RECOMMENDATIONS/TREATMENT PLAN:     1. Cervical radiculitis  - Desipramine (NORPRAMIN) 10 mg Tablet; Take 1  tablet by mouth every day at bedtime.  Dispense: 30 tablet; Refill: 11, risks and benefits extensively discussed with the patient including the need for an EKG. We were unable to locate other EKGs in the electronic medical record.  - Complete ECG 12 Lead with Rhythm Strip   - Recommend proceeding with cervical epidural steroid injection scheduled May 2022/  -  If patient does not get relief from the next cervical epidural steroid injection, consider following up with Dr. Omar Person for neck pain    2. Cervical radiculopathy  - See above     3. Chronic, continuous use of opioids  - Continue Subutex 2 mg sublingual every 12 hours   -May consider increasing buprenorphine to 3 times daily dosing if appropriate to see if this does not give him more pain relief    DISPOSITION AFTER TODAY'S VISIT:   CES pending           Dear Samuel Cobb, Samuel Hansen, Samuel Cobb    It was a pleasure to see your patient, Samuel Cobb, today in telemedicine follow-up  at the [facility].  As you know, Samuel Cobb is a 35yr -old male with a chief complaint of:    Chief Complaint   Patient presents with    Neck Pain    Arm Pain     Right         Samuel Cobb is here for neck pain, which started many month(s) ago. He associates the onset of pain with: unknown cause.      ALLERGIES:  Allergies   Allergen Reactions    Regal-Am [Quinine Sulfate] Other-Reaction in Comments     shake    Toradol [Ketorolac Tromethamine] Other-Reaction in Comments     shake    Tramadol Other-Reaction in Comments     shake    Zoloft [Sertraline Hcl] Palpitations       CURRENT MEDICATIONS:     Outpatient Medications Marked as Taking for the 10/31/20 encounter (Telemedicine Scheduled) with Collene Gobble, Samuel Cobb   Medication Sig Dispense Refill    Desipramine (NORPRAMIN) 10 mg Tablet Take 1 tablet by mouth every day at bedtime. 30 tablet 11       STOPPED PAIN MEDICATIONS:   None      Past Medical History:   Diagnosis Date    Anxiety     Diverticulitis        Past Surgical History:   Procedure Laterality Date    EXPLORATORY LAPAROTOMY  2015    PR REMOVAL GALLBLADDER  2011    TONSILLECTOMY  1991       Family History   Problem Relation Name Age of Onset    Other (GI abnormality) Mother           PSYCHOSOCIAL HISTORY:     Social History     Socioeconomic History    Marital status: SINGLE   Tobacco Use    Smoking status: Never Smoker    Smokeless tobacco: Never Used   Substance and Sexual Activity    Alcohol use: Not Currently     Comment: stopped after gallbladder surgery    Drug use: Yes     Frequency: 3.0 times per week     Types: Marijuana     The patient  reports that he has never smoked. He has never used smokeless tobacco.    PROBLEM LIST:  Patient Active Problem List   Diagnosis    Chronic abdominal pain  Anxiety    Degeneration of cervical intervertebral disc       PAIN TREATMENTS:   Previous  Injection Procedures: Samuel Cobb underwent a Cervical, C7-T1, epidural steroid injection - date 05/09/2020. The procedure resulted in  50% relief for 1  months      PREVIOUS DIAGNOSTIC STUDIES:   Images: The report of the image indicated below was reviewed.  X-ray of cervical spine and shoulder and MRI of cervical spine    MRI C-spine: 02/20/2020  MR C-SPINE WITHOUT CONTRAST  EXAM DATE:  02/20/2020 11:34 AM.  COMPARISON: Radiographs of the cervical spine 04/14/2019, MRI cervical spine  12/31/2018    INDICATION: neck pain;Worsening of neck pain and arm numbness.    TECHNIQUE: Routine spine without protocol. Precontrast sagittal T1, T2 FSE,  STIR, axial T1 and T2.    FINDINGS:    Alignment: Straightened normal cervical lordosis which could reflect muscle  spasm or could be positional.    Vertebral body heights and marrow: Multilevel spondylosis with osteophytes,  facet and uncovertebral arthropathy, as well as endplate marrow  degenerative changes are seen. Otherwise the vertebral body heights and  marrow signal are unremarkable.  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    Spinal Cord:The craniocervical junction is unremarkable. The spinal cord is  normal.    Soft tissues: Normal.     At C2-3: Unremarkable.    At C3-4:Unremarkable.    At C4-5:Disc bulge and uncovertebral hypertrophy, causing mild narrowing of  the thecal sac, similar to previous exam.    At C5-6:Disc bulge with annular tear and uncovertebral hypertrophy, causing  moderate spinal stenosis. Moderately severe bilateral neuroforaminal  narrowing, slightly increased from prior exam.    At C6-7:Disc bulge with annular tear and uncovertebral hypertrophy, causing  moderate spinal stenosis and moderately severe bilateral neuroforaminal  narrowing.    At C7-T1:Unremarkable.    IMPRESSION:    1. Mild progression of degenerative changes resulting in moderate  spinal and moderately severe bilateral neuroforaminal  stenosis at C5-6 and  C6-7.      XR Shoulder: 09/13/2019  SHOULDER 3+ VIEWS RIGHT  EXAM DATE: 09/13/2019 4:06 PM  COMPARISON: None    INDICATION: Signs/Symptoms:  Right shoulder pain    TECHNIQUE: AP, Grashey, axillary view of the right shoulder    FINDINGS:  No acute fracture or dislocation. Shoulder alignment is intact. Joint  spaces are maintained. Visualized lung fields and soft tissues are  unremarkable.        IMPRESSION:  1. No acute osseous abnormality.        XR C-spine: 04/14/2019  C-SPINE 4+ VIEWS  EXAM DATE: 04/14/2019 10:11 AM  COMPARISON: MRI 2020.    INDICATION: Signs/Symptoms: neck pain    TECHNIQUE: AP and lateral neutral, flexion, extension views of the cervical  spine.    FINDINGS:    Alignment: There is normal alignment of the spine.    Vertebrae: No fractures or destructive changes. There is continued anterior  interbody spurring C4-5. No pathological movement is seen from flexion to  extension.    Prevertebral and paraspinal soft tissues: within normal limits.    IMPRESSION:    1. Continued degenerative spurs C4-5 without pathological movement.         Labs: The lab/study results below were reviewed.    Lab Results   Lab Name Value Date/Time    WBC 9.6 11/07/2019 03:57 PM    HGB 14.3 11/07/2019 03:57 PM    HCT 42.9 11/07/2019  03:57 PM    PLT 228 11/07/2019 03:57 PM       Lab Results   Lab Name Value Date/Time    NA 135 11/07/2019 03:57 PM    K 4.0 11/07/2019 03:57 PM    CL 99 11/07/2019 03:57 PM    CO2 25 11/07/2019 03:57 PM    BUN 7 (L) 11/07/2019 03:57 PM    CR 0.79 11/07/2019 03:57 PM    GLU 70 11/07/2019 03:57 PM       No results found for: PGLU      Lab Results   Lab Name Value Date/Time    AST 22 11/07/2019 03:57 PM    ALT 16 11/07/2019 03:57 PM    ALP 58 11/07/2019 03:57 PM    ALB 4.6 11/07/2019 03:57 PM    TP 7.7 11/07/2019 03:57 PM    TBIL 0.5 11/07/2019 03:57 PM       PHYSICAL EXAM:    Constitutional: Normally developed, no  deformities,well groomed., healthy, alert, no distress, pleasant affect, cooperative, skin warm, dry, and pink      Psych:  Appearance/Cooperation: in no apparent distress, well developed and well nourished, non-toxic, in no respiratory distress and acyanotic, alert, oriented times 3, well groomed and dressed, cooperative and well hydrated  Attitude: pleasant   Behavior :normal  Eye Contact: normal  Attention Span: good  Speech: normal volume, rate, and pitch  Thought Process: logical and goal directed  Thought Content: no thought disorder or psychotic symptoms  Memory: good  Judgment: good  Relatedness: good          ASSESSMENT AND RECOMMENDATIONS/TREATMENT PLAN:   Please see the beginning of the note.       - The patient was instructed and educated on all aspects of the plan of care.  The patient acknowledged the plan of care.    SCRIBE DISCLAIMER:   This note was scribed by Jac Canavan, SCRIBE, a trained medical scribe in the presence of Leanne Lovely M.D.  Electronically signed by Jac Canavan, SCRIBE, 10/31/2020  10:09 AM      PROVIDER DISCLAIMER:   This document serves as my personal record of services taken in my presence. It was created on 10/31/2020 on my behalf by the trained medical scribe listed above.     I have reviewed this document and agree that this note accurately reflects the history and exam findings, the patient care provided, and my medical decision making.      - The patient was instructed and educated on all aspects of the plan of care.  The patient acknowledged the plan of care.  - I personally performed the services described in this documentation as scribed by the scribe (who is named above) in my presence, and it is both accurate and complete.   - Total time I spent in care of this patient today (including face-to-face time with the patient, review of the medical records today both before and after the visit, charting; but excluding time spent on other billable services) was 35  minutes. This is non-overlapping time from any other health care professional time statement that may be included on this date of service.      Levonne Lapping, Samuel Cobb  Department of Anesthesiology & Pain Medicine  Attending    Note Electronically Signed By:  Levonne Lapping, Samuel Cobb                Samuel Cobb: If you are reviewing this progress note and  have questions about the meaning or medical terms being used, please schedule an appointment or bring it up at your next follow-up appointment.  Medical notes are a communication tool between medical professionals and require medical terms to be used for efficiency. You can also look up terms via on-line medical dictionaries such as Medline Plus at [email or web address]

## 2020-11-14 ENCOUNTER — Ambulatory Visit: Payer: Medicaid (Managed Care)

## 2021-07-09 ENCOUNTER — Encounter: Payer: Self-pay | Admitting: ANESTHESIOLOGISTS

## 2021-07-09 NOTE — Telephone Encounter (Signed)
From: [name]  To: Levonne Lapping, MD  Sent: 07/05/2021 10:01 AM PST  Subject: Appointment     I had an appointment back in May for an injection that I had to cancel. Nobody ever rescheduled. My neck has been getting worse so if possible can I get an appointment scheduled to talk about it.

## 2021-07-09 NOTE — Telephone Encounter (Signed)
10/31/20- video visit with NS, CES recommended  11/08/20- 12 lead ECG ordered by NS to complete- not completed yet  11/14/20- CES patient canceled via text    Patient now wants to reschedule CES with NS.      Dr. Thedore Mins, Do you want patient to complete ECG prior to CES?     Message reviewed, determined further review/action is needed from Physician.

## 2021-07-09 NOTE — Telephone Encounter (Signed)
Left message for patient and sent a mychart message to see if request is for a follow up or procedure.    Thank you,  Jamison Neighbor, PSR III  [facility]   Patient line [phone]

## 2021-07-09 NOTE — Telephone Encounter (Signed)
Scheduling: Please schedule pt for follow up appt with Dr. Thedore Mins next available.

## 2021-07-09 NOTE — Telephone Encounter (Signed)
Hi all,    The ECG was regarding the medication that was started at that visit. I would counsel the patient that he should still obtain the ECG at his earliest convenience, but he can move forward with the CESI without the ECG.    Thanks,  Liane Comber

## 2021-07-10 NOTE — Telephone Encounter (Signed)
Authorization approved for: [facility] w/ NS     Sent a mychart message to the patient to call to schedule.    SCHEDULING: please call patient to schedule.    Thank you,  Kenyotta Dorfman, PSR III  [facility]   Patient line [phone]

## 2021-07-12 NOTE — Telephone Encounter (Signed)
Patient is scheduled.    Thank you,    Xylina Danh  Patient Services Rep II  Pain Management  [phone]

## 2021-07-29 NOTE — Progress Notes (Signed)
VIDEO Visit Note     This clinical encounter was performed by utilizing a real time VIDEO  connection between my location and Samuel Cobb's location. Samuel Cobb location was confirmed during this visit. Samuel Cobb was advised that if he does not use a Wi-Fi connection for today's visit, then he is responsible for any carrier charges for standard message rates and data charges. I obtained verbal consent from Samuel Cobb to perform this clinical encounter utilizing the above technological connection and prepared Samuel Cobb by answering any questions he had about the tele-health interaction.       Samuel Cobb  Department of Anesthesiology and Pain Medicine  [address]  [place], [place] [address]  Phone: [phone]    Fax: [phone]    Date: 07/31/2021    Re: Samuel Cobb  MR#: [id number]  Date of Birth: 01/17/1986  Age: 57yr    Requesting physician: Clare Charon, MD    Below is the summary of the visit with our mutual patient, Samuel Cobb. Thank you for your cooperation in his care.         Assessment and Differential Diagnosis:    ICD-10-CM    1. Cervical radiculitis  M54.12            No orders of the defined types were placed in this encounter.           Encounter Summary:  Samuel Cobb is a 36yr -old male who  has a past medical history of Anxiety and Diverticulitis. AND  has a past surgical history that includes tonsillectomy (1991); pr removal gallbladder (2011); and exploratory laparotomy (2015)..    Samuel Cobb was seen today in telemedicine follow-up at the [facility] for neck pain.     Patient was last seen in our clinic on 10/31/2020 with Dr. Candiss Norse via video and at that time recommended:  The primary encounter diagnosis was Cervical radiculitis. Diagnoses of Cervical radiculopathy and Chronic, continuous use of opioids were also pertinent to this visit. Treatment options as well as risks and benefits were discussed with  patient. Recommend starting Desipramine 10 mg at night and also recommend completing EKG when starting this new medication and can be completed on the day that he comes in for the cervical epidural steroid injection, since he lives far away. At this time we also recommend completing cervical epidural steroid injection scheduled in May 2022 and we will reassess his pain after. If patient does not get relief from the next cervical epidural steroid injection, consider following up with Dr. Harvie Bridge for neck pain. Also recommend establishing with a new PCP.         Recommendations     RECOMMENDATIONS/TREATMENT PLAN:      1. Cervical radiculitis  - Desipramine (NORPRAMIN) 10 mg Tablet; Take 1 tablet by mouth every day at bedtime.  Dispense: 30 tablet; Refill: 11, risks and benefits extensively discussed with the patient including the need for an EKG. We were unable to locate other EKGs in the electronic medical record.  - Complete ECG 12 Lead with Rhythm Strip   - Recommend proceeding with cervical epidural steroid injection scheduled May 2022/  -  If patient does not get relief from the next cervical epidural steroid injection, consider following up with Dr. Harvie Bridge for neck pain     2. Cervical radiculopathy  - See above      3. Chronic, continuous use of opioids  - Continue Subutex 2  mg sublingual every 12 hours   -May consider increasing buprenorphine to 3 times daily dosing if appropriate to see if this does not give him more pain relief    Patient was also seen by Neurosurgery (Dr. Harvie Bridge) on 09/13/2019 and at that time recommended:   [name] is a 36yr old male with cervicalgia and right shoulder pain for 10+ years.  Patient states that his pain has been getting slightly worse with no numbness and tingling in the morning after trigger point injections.  His MRI shows some cervical stenosis at C5-7 this would not likely cause the symptoms that he is experiencing.  We therefore recommend x-rays of his shoulder and  then follow-up with physical therapy for shoulder strengthening.  Should this not work we recommend potential referral for pain management for further investigation of his shoulder pain.     Recommendations:  1) Shoulder xrays  2) Additional shoulder imaging as needed  3) Physical therapy for shoulder pain  4) Pain management referral if PT doesn't help       Today, patient reports neck pain that has been ongoing and worsening with arm numbness (worst on the left) in the morning that lasts about 30 minutes. Whole arm feels numb X 30 minutes.  He was scheduled for a cervical epidural steroid injection on 11/14/2020 however states that he has trouble getting to his appointments due to transportation.  He lives in [place], [place].    Did not like being on gabapentin in the last.  Current treatments:  Subutex 2 mg sublingual every 12 hours (with benefit)     On review of recent imaging, patient had MRI of cervical spine on 02/20/2020 shows mild progression of degenerative changes resulting in moderate spinal and moderately severe bilateral neuroforaminal stenosis at C5-6 and C6-7.    The encounter diagnosis was Cervical radiculitis. Treatment options discussed with patient. Recommend starting Medrol 4 mg dose pack that was prescribed today for neck pain (#21). Also recommend Lyrica 50 mg two times daily that as prescribed today for nerve pain with titration schedule given to patient. Can consider other nerve type medications in the future should patient fail Lyrica. Refills through PCP and patient seemed to understand this.     Can consider rescheduling cervical epidural steroid injection in the future once patient can secure transportation.        RECOMMENDATIONS/TREATMENT PLAN:   1. Cervical radiculitis  - MEDROL, PAK, 4 mg tablet; Follow package instructions  Dispense: 21 tablet; Refill: 0  - Pregabalin (LYRICA) 25 mg Capsule; Take 2 capsules by mouth 2 times daily.  Dispense: 120 capsule; Refill: 2  - Can consider  other nerve type medications in the future should patient fail, like Topiramte  - Refills through PCP  - Can consider rescheduling cervical epidural steroid injection in the future once patient can secure transportation    DISPOSITION AFTER TODAY'S VISIT:   Follow up in 3-4 months with NS via video        Dear Dr. Tasia Catchings, Ronald Lobo, MD    It was a pleasure to see your patient, Samuel Cobb, today in telemedicine follow-up  at the [facility], [facility].  As you know, Samuel Cobb is a 36yr -old male with a chief complaint of:    Chief Complaint   Patient presents with    Arm Pain     Left worse than right         Samuel Cobb is here for  neck pain, which started many month(s) ago. He associates the onset of pain with: unknown cause.      ALLERGIES:  Allergies   Allergen Reactions    Regal-Am [Quinine Sulfate] Other-Reaction in Comments     shake    Toradol [Ketorolac Tromethamine] Other-Reaction in Comments     shake    Tramadol Other-Reaction in Comments     shake    Zoloft [Sertraline Hcl] Palpitations       CURRENT MEDICATIONS:     No outpatient medications have been marked as taking for the 07/31/21 encounter (Telemedicine Scheduled) with Samuel Heal, MD.       STOPPED PAIN MEDICATIONS:   None    OPIOID MEDICATIONS  The patient was asked if they are taking opioid medications such as hydrocodone (Vicodin, Norco, Lortab, Zohydro), hydromorphone (Dilaudid, Exalgo), fentanyl (Duragesic, Actiq, Fentora, Subsys), morphine (MS Contin, Kadian, Avinza), oxycodone (percocet, roxycodone, Oxycontin) Methadone, Buprenorphine (Suboxone, Subutex, BuTrans) Oxymorphone (Opana), codeine (Tylenol #3 or #4), Tramadol (Ultram), Tapentadol (Nucynta)     The patient answers  Yes to current opioid use.    TIMING OF PAIN  The patient states that pain is present  constantly (100% of the time)                                  PAIN QUALITY  The patient describes the pain as   burning,pins and needles,sharp,throbbing    PAIN INTENSITY - Visual Analog Scale (VAS): 0-10 Scale (0 = No Pain, 10 = Worst Imaginable Pain)  Currently the patient has a VAS of  9/10.  On average the pain score for the last week has been a VAS of  9 /10.  At its best, the VAS pain score is  10 /10.  At its worst, the VAS pain score is  10 /10.    RELIEVING AND AGGRAVATING FACTORS  The patient was asked if any of the following activities either alleviate or exacerbate their pain: lying down, standing, sitting walking, exercise, medications, relaxation, thinking about something else, coughing/sneezing, urination, or bowel movements  The patient states that the pain:  is relieved by:  medications    is aggravated by:    lying down  standing  sitting  exercise (if applicable)  coughing/sneezing  bowel movements    FUNCTIONAL ASSESSMENT (Pain Disability Index):  Measures the level of disability related to pain: 0-10 Scale (0= No Disability, 2=Minimal, 5=Moderate, 8=Severe, 10= Total Disability)    ACC Pain Assessment       PAIN FUNCTIONAL ASSESSMENT [place] 07/31/2021    Family and Home 7    Recreation 7    Social 7    Occupation 9    Sexual Behavior 0    [place] 4    Life Support 4    [place] 38              REVIEW OF SYSTEMS:  The patient was asked to review the list below and report if they were currently experiencing any of the symptoms:    Constitutional   o  fever or chills   o  unplanned weight loss  Eyes   o  double or blurred vision   ENT   o  hearing loss   o  Difficulty swallowing  Hematologic/Lymphatic   o  bleeding gums   o  low platelet count  Endocrine   o  Heat intolerance  o cold intolerance   o thyroid problems  Integumentary   o skin rash  Respiratory   o shortness of breath   o wheezing  Cardiac   o palpitations ( awareness of fast heart)   o chest pain  Gastrointestinal   o constipation   o abdominal pain   o nausea   o vomiting   o diarrhea  Genito-urinary   o sexual dysfunction   o urinary retention or  difficulty urinating  Musculoskeletal   o back pain   o neck pain   o joint pain   o muscle pain  Neurological   o loss of consciousness or blackouts   o memory loss   o muscle weakness   o seizures   o trouble walking   o dizziness   o drowsiness   o excessive fatigue  Behavioral   o difficulty falling or remaining asleep   o loss of interest in hobbies or other activities   o difficulty concentrating   o feelings of guilt   o feeling depressed      THE PATIENT ADMITS TO THE FOLLOWING OF THE ABOVE LISTED SYMPTOMS:      abdominal pain  back pain  neck pain  joint pain (knee, elbow, hip etc.)  muscle pain  muscle weakness  dizziness  excessive fatigue  difficulty falling or remaining asleep  loss of interest in hobbies or other activites  feeling depressed    All other systems were negative.    The Review of Systems was reviewed.    PAST MEDICAL, FAMILY, SOCIAL HISTORY:   Family Life: Living Circumstances:  The patient currently lives  with an adult companion      Past Medical History:   Diagnosis Date    Anxiety     Diverticulitis        Past Surgical History:   Procedure Laterality Date    EXPLORATORY LAPAROTOMY  2015    PR REMOVAL GALLBLADDER  2011    TONSILLECTOMY  1991       Family History   Problem Relation Name Age of Onset    Other (GI abnormality) Mother           PSYCHOSOCIAL HISTORY:     Social History     Socioeconomic History    Marital status: SINGLE   Tobacco Use    Smoking status: Never    Smokeless tobacco: Never   Substance and Sexual Activity    Alcohol use: Not Currently     Comment: stopped after gallbladder surgery    Drug use: Yes     Frequency: 3.0 times per week     Types: Marijuana     The patient  reports that he has never smoked. He has never used smokeless tobacco.    PROBLEM LIST:  Patient Active Problem List   Diagnosis    Chronic abdominal pain    Anxiety    Degeneration of cervical intervertebral disc       PAIN TREATMENTS:   Previous Injection Procedures: Samuel Cobb did not undergo  a recent procedure.      PREVIOUS DIAGNOSTIC STUDIES:  Images: The report of the image indicated below was reviewed.   X-ray of cervical spine and MRI of cervical spine    MRI C-spine: 02/20/2020  MR C-SPINE WITHOUT CONTRAST  EXAM DATE:  02/20/2020 11:34 AM.  COMPARISON: Radiographs of the cervical spine 04/14/2019, MRI cervical spine  12/31/2018     INDICATION: neck pain;Worsening of neck  pain and arm numbness.     TECHNIQUE: Routine spine without protocol. Precontrast sagittal T1, T2 FSE,  STIR, axial T1 and T2.     FINDINGS:     Alignment: Straightened normal cervical lordosis which could reflect muscle  spasm or could be positional.     Vertebral body heights and marrow: Multilevel spondylosis with osteophytes,  facet and uncovertebral arthropathy, as well as endplate marrow  degenerative changes are seen. Otherwise the vertebral body heights and  marrow signal are unremarkable.  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     Spinal Cord:The craniocervical junction is unremarkable. The spinal cord is  normal.     Soft tissues: Normal.      At C2-3: Unremarkable.     At C3-4:Unremarkable.     At C4-5:Disc bulge and uncovertebral hypertrophy, causing mild narrowing of  the thecal sac, similar to previous exam.     At C5-6:Disc bulge with annular tear and uncovertebral hypertrophy, causing  moderate spinal stenosis. Moderately severe bilateral neuroforaminal  narrowing, slightly increased from prior exam.     At C6-7:Disc bulge with annular tear and uncovertebral hypertrophy, causing  moderate spinal stenosis and moderately severe bilateral neuroforaminal  narrowing.     At C7-T1:Unremarkable.     IMPRESSION:     1. 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      Xr C-spine: 04/14/2019  C-SPINE 4+ VIEWS  EXAM DATE: 04/14/2019 10:11 AM  COMPARISON: MRI 2020.     INDICATION:  Signs/Symptoms: neck pain     TECHNIQUE: AP and lateral neutral, flexion, extension views of the cervical  spine.     FINDINGS:     Alignment: There is normal alignment of the spine.     Vertebrae: No fractures or destructive changes. There is continued anterior  interbody spurring C4-5. No pathological movement is seen from flexion to  extension.     Prevertebral and paraspinal soft tissues: within normal limits.     IMPRESSION:     1. Continued degenerative spurs C4-5 without pathological movement.         Labs: The lab/study results below were reviewed.    Lab Results   Lab Name Value Date/Time    WBC 9.6 11/07/2019 03:57 PM    HGB 14.3 11/07/2019 03:57 PM    HCT 42.9 11/07/2019 03:57 PM    PLT 228 11/07/2019 03:57 PM       Lab Results   Lab Name Value Date/Time    NA 135 11/07/2019 03:57 PM    K 4.0 11/07/2019 03:57 PM    CL 99 11/07/2019 03:57 PM    CO2 25 11/07/2019 03:57 PM    BUN 7 (L) 11/07/2019 03:57 PM    CR 0.79 11/07/2019 03:57 PM    GLU 70 11/07/2019 03:57 PM       No results found for: PGLU      Lab Results   Lab Name Value Date/Time    AST 22 11/07/2019 03:57 PM    ALT 16 11/07/2019 03:57 PM    ALP 58 11/07/2019 03:57 PM    ALB 4.6 11/07/2019 03:57 PM    TP 7.7 11/07/2019 03:57 PM    TBIL 0.5 11/07/2019 03:57 PM             PHYSICAL EXAM:    Constitutional: Normally developed, no deformities,well groomed., healthy, alert, no distress, pleasant affect,  cooperative, skin warm, dry, and pink    Psych:  Appearance/Cooperation: in no apparent distress, well developed and well nourished, non-toxic, in no respiratory distress and acyanotic, alert, oriented times 3, well groomed and dressed, cooperative and well hydrated  Attitude: pleasant   Behavior :normal  Eye Contact: normal  Attention Span: good  Speech: normal volume, rate, and pitch  Thought Process: logical and goal directed  Thought Content: no thought disorder or psychotic symptoms  Memory: good  Judgment: good  Relatedness:  good          ASSESSMENT AND RECOMMENDATIONS/TREATMENT PLAN:   Please see the beginning of the note.       - The patient was instructed and educated on all aspects of the plan of care.  The patient acknowledged the plan of care.      SCRIBE DISCLAIMER:   This note was scribed by Alain Honey, SCRIBE, a trained medical scribe in the presence of Marylyn Ishihara M.D.  Electronically signed by Alain Honey, SCRIBE, 07/29/2021  8:48 AM      PROVIDER DISCLAIMER:   This document serves as my personal record of services taken in my presence. It was created on 07/29/2021 on my behalf by the trained medical scribe listed above.     I have reviewed this document and agree that this note accurately reflects the history and exam findings, the patient care provided, and my medical decision making.      - The patient was instructed and educated on all aspects of the plan of care.  The patient acknowledged the plan of care.  - I personally performed the services described in this documentation as scribed by the scribe (who is named above) in my presence, and it is both accurate and complete.   - Total time I spent in care of this patient today (including face-to-face time with the patient, review of the medical records today both before and after the visit, charting; but excluding time spent on other billable services) was 25 minutes. This is non-overlapping time from any other health care professional time statement that may be included on this date of service.      Natale Lay, MD  Department of Anesthesiology & Pain Medicine  Attending    Note Electronically Signed By:  Natale Lay, MD              Mr. Sobotta: If you are reviewing this progress note and have questions about the meaning or medical terms being used, please schedule an appointment or bring it up at your next follow-up appointment.  Medical notes are a communication tool between medical professionals and require medical terms to be used for  efficiency. You can also look up terms via on-line medical dictionaries such as Medline Plus at http://kim.org/.html

## 2021-07-31 ENCOUNTER — Ambulatory Visit: Payer: Medicaid (Managed Care) | Admitting: ANESTHESIOLOGISTS

## 2021-07-31 ENCOUNTER — Encounter: Payer: Self-pay | Admitting: ANESTHESIOLOGISTS

## 2021-07-31 DIAGNOSIS — M5412 Radiculopathy, cervical region: Secondary | ICD-10-CM

## 2021-07-31 MED ORDER — PREGABALIN 25 MG CAPSULE
50.0000 mg | ORAL_CAPSULE | Freq: Two times a day (BID) | ORAL | 2 refills | Status: DC
Start: 2021-07-31 — End: 2023-11-14

## 2021-07-31 MED ORDER — MEDROL (PAK) 4 MG TABLETS IN A DOSE PACK
ORAL_TABLET | ORAL | 0 refills | Status: AC
Start: 2021-07-31 — End: 2022-07-31

## 2021-07-31 NOTE — Patient Instructions (Signed)
pregabalin  Pronunciation:  pre GAB a lin    Brand:  Lyrica      PREGABALIN (Lyrica) 25 mg/50 mg/75 mg    Morning Bedtime Schedule   0 1 Day 1 - 5   0 2 Day 6 - 10   1 2 Day 11 - 15   2 2 Day 16 - 20     Please review this entire sheet. It contains important information on:  How to take this medication, side effects, precautions, drug interactions, what to do in an overdose, what do if a dose is missed and how to store the medication.  Increase the dose as instructed above only if you do not notice pain relief at a lower dose AND as long as you are not having side effects from the drug  If you notice pain relief at a lower dose, you do not need to continue increasing the dose.  If it is necessary to discontinue this drug, the dosage should be decreased the opposite way you started it. Do not stop taking this drug suddenly without your doctor's approval since there is a small, potential risk of withdrawal seizures.  Further titration may be considered based upon your response to medication under direction of your health care provider.        Uses  This medication is used to treat seizure disorders (epilepsy) or nerve pain caused by diabetes or herpes zoster (shingles).  We are prescribing it to treat your neuropathic pain.         What is the most important information I should know about pregabalin?  You may have thoughts about suicide while taking this medication. Your doctor will need to check you at regular visits. Do not miss any scheduled appointments.  Call your doctor at once if you have any new or worsening symptoms such as: mood or behavior changes, depression, anxiety, insomnia, or if you feel agitated, hostile, restless, hyperactive (mentally or physically), or have thoughts about suicide or hurting yourself.    If you are taking pregabalin to prevent seizures, keep taking the medication even if you feel fine.      Do not stop using pregabalin without first talking to your doctor, even if you feel fine.  You may have increased seizures or withdrawal symptoms such as headache, sleep problems, nausea, and diarrhea. Ask your doctor how to avoid withdrawal symptoms when you stop using pregabalin.    Do not change your dose of pregabalin without your doctor's advice. Tell your doctor if the medication does not seem to work as well in treating your condition.    Wear a medical alert tag or carry an ID card stating that you take pregabalin. Any medical care provider who treats you should know that you take seizure medication.    What is pregabalin?  Pregabalin is an anti-epileptic drug, also called an anticonvulsant. It works by slowing down impulses in the brain that cause seizures. Pregabalin also affects chemicals in the brain that send pain signals across the nervous system.  Pregabalin is used to control seizures and to treat fibromyalgia. It is also used to treat pain caused by nerve damage in people with diabetes (diabetic neuropathy) or herpes zoster (post-herpetic neuralgia).  Pregabalin may also be used for purposes not listed in this medication guide.  What should I discuss with my healthcare provider before taking pregabalin?    You should not use this medication if you are allergic to pregabalin.    To make   sure you can safely take pregabalin, tell your doctor if you have any of these other conditions:  congestive heart failure;  diabetes (unless you are taking pregabalin to treat diabetic neuropathy);  kidney disease (or if you are on dialysis);  a bleeding disorder;  low levels of platelets in your blood;  a history of depression or suicidal thoughts;  a history of drug or alcohol addiction; or  if you have ever had a severe allergic reaction.  You may have thoughts about suicide while taking this medication. Tell your doctor if you have new or worsening depression or suicidal thoughts during the first several months of treatment, or whenever your dose is changed.  Your family or other caregivers should also  be alert to changes in your mood or symptoms. Your doctor will need to check you at regular visits. Do not miss any scheduled appointments.    FDA pregnancy category C. It is not known whether pregabalin will harm an unborn baby, but having a seizure during pregnancy could harm both mother and baby. Tell your doctor right away if you become pregnant while taking pregabalin for seizures. Do not start or stop taking pregabalin during pregnancy without your doctor's advice.    If you become pregnant, your name may be listed on a pregnancy registry. This is to track the outcome of the pregnancy and to evaluate any effects of pregabalin on the baby.  If a man fathers a child while using this medication, the baby may have birth defects. Use a condom to prevent pregnancy during your treatment.    It is not known whether pregabalin passes into breast milk or if it could harm a nursing baby. You should not breast-feed while you are using pregabalin.        Do not give this medication to a child younger than 18 years old.    How should I take pregabalin?  Take exactly as prescribed by your doctor. Do not take in larger or smaller amounts or for longer than recommended. Follow the directions on your prescription label.  You may take pregabalin with or without food.  Do not change your dose of pregabalin without your doctor's advice. Tell your doctor if the medication does not seem to work as well in treating your condition.    Call your doctor if you have any problems with your vision while taking pregabalin.        If you are taking pregabalin to prevent seizures, keep taking it even if you feel fine. You may have an increase in seizures if you stop taking pregabalin. Follow your doctor's instructions.      Do not stop using pregabalin without first talking to your doctor, even if you feel fine. You may have increased seizures or withdrawal symptoms such as headache, sleep problems, nausea, and diarrhea. Ask your doctor how  to avoid withdrawal symptoms when you stop using pregabalin.      Wear a medical alert tag or carry an ID card stating that you take pregabalin. Any medical care provider who treats you should know that you take seizure medication.      Store at room temperature away from moisture, light, and heat.    What happens if I miss a dose?  Take the missed dose as soon as you remember. Skip the missed dose if it is almost time for your next scheduled dose. Do not  take extra medicine to make up the missed dose.  What happens if I   overdose?    Seek emergency medical attention or call the Poison Help line at 1-800-222-1222.    What should I avoid while taking pregabalin?    Drinking alcohol can increase certain side effects of pregabalin.        Pregabalin may impair your thinking or reactions. Be careful if you drive or do anything that requires you to be alert.    What are the possible side effects of pregabalin?    Get emergency medical help if you have any of these signs of an allergic reaction:  hives; difficulty breathing; swelling of your face, lips, tongue, or throat.    Report any new or worsening symptoms to your doctor, such as: mood or behavior changes, anxiety, panic attacks, trouble sleeping, or if you feel impulsive, irritable, agitated, hostile, aggressive, restless, hyperactive (mentally or physically), more depressed, or have thoughts about suicide or hurting yourself.    Call your doctor at once if you have any of these serious side effects:    muscle pain, weakness, or tenderness (especially if you also have a fever and feel tired);  vision problems;  easy bruising or bleeding; or  swelling in your hands or feet, rapid weight gain.  Less serious side effects may include:  dizziness, drowsiness;  loss of balance or coordination;  problems with memory or concentration;  breast swelling;  tremors; or  dry mouth, constipation.  This is not a complete list of side effects and others may occur. Call your doctor  for medical advice about side effects. You may report side effects to FDA at 1-800-FDA-1088.  What other drugs will affect pregabalin?    Cold or allergy medicine, sedatives, narcotic pain medicine, sleeping pills, muscle relaxers, and medicine for depression or anxiety can add to dizziness or sleepiness caused by pregabalin. Tell your doctor if you regularly use any of these medicines, or any other seizure medication.    Tell your doctor about all other medicines you use, especially:  rosiglitazone (Avandia, Avandamet, Avandaryl); or  heart or blood pressure medication such as benazepril (Lotensin), enalapril (Vasotec), lisinopril (Prinivil, Zestril), quinapril (Accupril), ramipril (Altace), and others.  This list is not complete and other drugs may interact with pregabalin. Tell your doctor about all medications you use. This includes prescription, over-the-counter, vitamin, and herbal products. Do not start a new medication without telling your doctor.  Where can I get more information?  Your pharmacist can provide more information about pregabalin.    Remember, keep this and all other medicines out of the reach of children, never share your medicines with others, and use this medication only for the indication prescribed.  Every effort has been made to ensure that the information provided by [employer] ('[employer]') is accurate, up-to-date, and complete, but no guarantee is made to that effect. Drug information contained herein may be time sensitive. [employer] information has been compiled for use by healthcare practitioners and consumers in the [place] and therefore [employer] does not warrant that uses outside of the [place] are appropriate, unless specifically indicated otherwise. [employer]'s drug information does not endorse drugs, diagnose patients or recommend therapy. [employer]'s drug information is an informational resource designed to assist licensed healthcare practitioners in caring for their  patients and/or to serve consumers viewing this service as a supplement to, and not a substitute for, the expertise, skill, knowledge and judgment of healthcare practitioners. The absence of a warning for a given drug or drug combination in no way should be construed to   indicate that the drug or drug combination is safe, effective or appropriate for any given patient. [employer] does not assume any responsibility for any aspect of healthcare administered with the aid of information [employer] provides. The information contained herein is not intended to cover all possible uses, directions, precautions, warnings, drug interactions, allergic reactions, or adverse effects. If you have questions about the drugs you are taking, check with your doctor, nurse or pharmacist.  Copyright 1996-2012 [employer] Version: 4.01. Revision date: 03/22/2010.  This information does not replace the advice of a doctor. [employer] disclaims any warranty or liability for your use of this information.    Content Version: 9.4.94723        How to Take this Medication  Take this medication by mouth exactly as prescribed--you will be told how much of this medicine to use and how often. This medicine is sometimes taken twice daily and sometimes three times daily.  Your dose will depend on the condition being treated.  During the first few days your health care provider may gradually increase your dose to allow your body to adjust to the medication. To minimize side effects, take the very first dose at bedtime. For best effects, take this medication at evenly spaced times throughout the day and night. This will ensure a constant level of drug in your body. Do not take this more often or increase your dose without consulting your health care provider. Your condition will not improve any faster but the risk of serious side effects may be increased. Do not stop taking this drug suddenly without your health care provider's approval  since there is a small, potential risk of withdrawal seizures.  If it is necessary to discontinue this drug, the dosage should be decreased the opposite way you started it.     Side Effects  This drug is usually well tolerated. Drowsiness, dizziness, unsteadiness, fatigue or nausea may occur. If these effects persist or worsen, notify your doctor. Unlikely to occur but report promptly:  tingling or numbness of the hands or feet, swelling of ankles, vision problems, fever, unusual bleeding, and mental or mood changes.  The FDA issued new information on Jan. 31, 2008, to alert health care professionals about an increased risk of suicidal thoughts and behaviors in patients who take this medication.    The FDA found that patients taking antiepileptics have about twice the risk of suicidal thoughts and behaviors, compared with patients receiving an inactive substance (placebo). This risk corresponds to an estimated 2.1 per 1,000 more patients in the drug treatment groups who experienced suicidality than in the placebo groups. Therefore, if you or a family member notice a change in your mood after starting or adjusting the dose of his medication, notify your doctor right away.     Very unlikely to occur but report promptly: fainting, difficulty moving, stiffness, uncontrolled movements, stomach or abdominal pain, leg pain, chest pain, trouble breathing.   If you notice other effects not listed above, contact your health care provider or pharmacist.   Precautions  Tell your health care provider your medical history, especially of:  Heart failure or other heart conditions, kidney disease, bleeding disorder, or drug allergies. Use caution operating machinery or engaging in activities that require alertness. Avoid alcohol intake as it may intensify the dizziness/drowsiness effect of this drug. This medication should be used only when clearly needed during pregnancy. Discuss the risks and benefits with your health care  provider. Pregabalin passes into breast milk. Because   the effects of this drug on the nursing infant are not known, consult your health care provider before breast-feeding.     Interactions  Tell your health care provider of any over-the-counter or prescription medication you use, especially: other medication for seizures, antacids. Because antacids may interfere with the absorption of this medication, it is best to take pregabalin at least 2 hours after taking an antacid. Do not take them at the same time. Do not start or stop any medicine without doctor or pharmacist approval.     Overdose  If overdose is suspected, contact your local poison control center or emergency room immediately.     Missed Dose  Try to take each dose at the scheduled time. If you miss a dose, take it as soon as remembered; do not take it if it is near the time for the next dose, instead, skip the missed dose and resume your usual dosing schedule. Do not "double-up" the dose to catch up.     Storage  Store this medication at room temperature between 59 and 86 degrees F (between 15 and 30 degrees C) away from heat, moisture, and light Do not store in the bathroom. Keep this and all medications out of the reach of children and never share your medicine with anyone. The oral solution form of this drug must be refrigerated between 36-46 degrees F (2-8 degrees C).

## 2021-11-04 ENCOUNTER — Encounter: Payer: Self-pay | Admitting: ANESTHESIOLOGISTS

## 2021-11-04 ENCOUNTER — Other Ambulatory Visit: Payer: Self-pay | Admitting: ANESTHESIOLOGISTS

## 2021-11-04 DIAGNOSIS — M5412 Radiculopathy, cervical region: Secondary | ICD-10-CM

## 2021-11-04 NOTE — Telephone Encounter (Signed)
Left voicemail on the patient's callback phone number listed/provided.     Received refill request from [facility], for medication: Desipramine 10mg     I called the patient to verify some information, before sending this off to the prescriber.   -Is he still taking this med?   -Is he needing it refilled?   -And are the daily med instructions still the same? (Take 1 tab by mouth everyday at bedtime)

## 2021-11-04 NOTE — Telephone Encounter (Signed)
From: [name]  To: Levonne Lapping, MD  Sent: 11/04/2021 2:04 PM PDT  Subject: Phone call    I received a phone call from the [facility] office but they didn't leave a message. I'm trying to find out who called so I can call back.

## 2021-11-04 NOTE — Telephone Encounter (Signed)
Addressed in mychart encounter dated 11/04/21. Pt does not need refill at this time.     This encounter needs no further action, please close the encounter.

## 2021-11-04 NOTE — Telephone Encounter (Signed)
Called pt, ID [id number].     See TE from 11/04/21 regarding refill request from [facility]. Pt states he still has plenty of this medication left and he does not need a refill at this time. States he takes it 1x/day at nighttime, but he sometimes forgets to take it. States he is not sure yet if it is helping with his neck nerve pain. Advised pt to call back if needs this medication refilled in the future.     This encounter needs no further action, please close the encounter.

## 2022-02-20 ENCOUNTER — Encounter: Payer: Self-pay | Admitting: ANESTHESIOLOGISTS

## 2022-02-20 NOTE — Telephone Encounter (Signed)
From: [name]  To: Levonne Lapping, MD  Sent: 02/20/2022 8:44 AM PDT  Subject: Numbness in arm and hand    Good morning. For a while I've noticed no matter how I lay down, my arms start going numb. When I wake up in the morning it usually takes a couple of minutes, but the tingling usually goes away. It's been getting worse and today I woke up and it's now been 3 hours and my lower right arm and hand are still numb and tingling. I'm not really sure what to do. Would urgent care be able to do anything? I don't really know what can be done without imaging. It's been 2 years since the last MRI, give or take. Would it be beneficial to take another look? I can still grab and use my hand without much issue as long as I visually make sure my hand has a secure grip, but if I just go to pick something up blindly I don't always grab with enough pressure to hold whatever it is. Today is the first time it's ever been like this and I'm not sure what options I have to get it checked out. Should I try the ER? I don't know if this is really an emergency as opposed to a serious annoyance.

## 2022-02-21 NOTE — Telephone Encounter (Signed)
Spoke to patient, id x3     Patient is scheduled for 08/30 with Ns for in person follow up     Routing to referral for f/v auth     Thank You,    Glean Salvo PSR II  [facility]

## 2022-02-21 NOTE — Telephone Encounter (Signed)
Scheduling: In person with ND or NS next available.  Patient should also follow-up with his primary care physician.

## 2022-02-27 ENCOUNTER — Encounter: Payer: Self-pay | Admitting: ANESTHESIOLOGISTS

## 2022-02-27 NOTE — Telephone Encounter (Signed)
From: [name]  To: Levonne Lapping, MD  Sent: 02/27/2022 1:02 PM PDT  Subject: Appointment on the 30th    Does my appointment have to be in person? I'm having trouble finding transportation down to [facility] and I don't want to cancel and reschedule unless I absolutely have to.

## 2022-02-27 NOTE — Telephone Encounter (Signed)
Patient is scheduled for Follow-up on 8/30.  He is asking if it can be converted to video visit     Message reviewed, determined further review/action is needed from Physician.

## 2022-02-28 NOTE — Telephone Encounter (Signed)
Pt's appt. on 8/30 has been changed to a VV. Ok per NS.    Thank you,    Cristela Felt  PSR II  Pain Management  [phone]

## 2022-02-28 NOTE — Telephone Encounter (Signed)
Okay for video visit.

## 2022-03-04 NOTE — Progress Notes (Unsigned)
telehealth:20317::"VIDEO" Visit Note     This clinical encounter was performed by utilizing a real time  telehealth:20317::"VIDEO"  connection between my location and Samuel Cobb's location. Samuel Cobb location was confirmed during this visit. Samuel Cobb was advised that if he does not use a Wi-Fi connection for today's visit, then he is responsible for any carrier charges for standard message rates and data charges. I obtained verbal consent from Samuel Cobb to perform this clinical encounter utilizing the above technological connection and prepared Samuel Cobb by answering any questions he had about the tele-health interaction.       [facility]  Department of Anesthesiology and Pain Medicine  [address]  [place], [place] [address]  Phone: [phone]    Fax: [phone]    Date: 03/04/2022    Re: Samuel Cobb  MR#: [id number]  Date of Birth: May 28, 1986  Age: 33yr    Requesting physician: Lyda Kalata, MD    Below is the summary of the visit with our mutual patient, Samuel Cobb. Thank you for your cooperation in his care.         Assessment and Differential Diagnosis:  No diagnosis found.     No orders of the defined types were placed in this encounter.           Encounter Summary:  Samuel Cobb is a 36yr -old male who  has a past medical history of Anxiety and Diverticulitis. AND  has a past surgical history that includes tonsillectomy (1991); pr removal gallbladder (2011); and exploratory laparotomy (2015)..    Samuel Cobb was seen today in telemedicine follow-up at the [facility] for neck pain.     Patient was last seen in our clinic on 07/31/2021 with Dr. Thedore Mins via video and at that time recommended:  The encounter diagnosis was Cervical radiculitis. Treatment options discussed with patient. Recommend starting Medrol 4 mg dose pack that was prescribed today for neck pain (#21). Also recommend Lyrica 50 mg two times daily that as  prescribed today for nerve pain with titration schedule given to patient. Can consider other nerve type medications in the future should patient fail Lyrica. Refills through PCP and patient seemed to understand this.      Can consider rescheduling cervical epidural steroid injection in the future once patient can secure transportation.         Recommendations   RECOMMENDATIONS/TREATMENT PLAN:   1. Cervical radiculitis  - MEDROL, PAK, 4 mg tablet; Follow package instructions  Dispense: 21 tablet; Refill: 0  - Pregabalin (LYRICA) 25 mg Capsule; Take 2 capsules by mouth 2 times daily.  Dispense: 120 capsule; Refill: 2  - Can consider other nerve type medications in the future should patient fail, like Topiramte  - Refills through PCP  - Can consider rescheduling cervical epidural steroid injection in the future once patient can secure transportation    Patient was also seen by *** on *** and at that time recommended:    Prior treatments:  gabapentin     Prior interventions:    Current treatments:  Subutex 2 mg sublingual every 12 hours (with benefit)    Today, patient reports ***.     On review of recent imaging, patient had MRI of cervical spine on 02/20/2020 shows mild progression of degenerative changes resulting in moderate spinal and moderately severe bilateral neuroforaminal stenosis at C5-6 and C6-7.    On physical exam today, patient has ***    There  were no encounter diagnoses. Treatment options discussed with patient. ***                   RECOMMENDATIONS/TREATMENT PLAN:    Please type in the dot phrase .DIAGMED only AFTER you have entered all of the pertinent diagnoses. Since this dot phrase is NOT refreshable, you will need to manually enter any diagnosis added after-the-fact. time    DISPOSITION AFTER TODAY'S VISIT:  If you plan to see the patient for a procedure type .COVIDDISPO;  non-procedure follow up clinic visit type .FOLLOWUPPAIN ; or If you plan to discharge the patient, type .DCPAIN             Dear Dr. Tasia Catchings, Samuel Lobo, MD    It was a pleasure to see your patient, Samuel Cobb, today in telemedicine follow-up  at the [facility].  As you know, Samuel Cobb is a 36yr -old male with a chief complaint of:    No chief complaint on file.      Samuel Cobb is here for *** pain, which started  [id number]  Time Units:10300 ago. He associates the onset of pain with: ***.      ALLERGIES:  Allergies   Allergen Reactions    Regal-Am [Quinine Sulfate] Other-Reaction in Comments     shake    Toradol [Ketorolac Tromethamine] Other-Reaction in Comments     shake    Tramadol Other-Reaction in Comments     shake    Zoloft [Sertraline Hcl] Palpitations       CURRENT MEDICATIONS:     No outpatient medications have been marked as taking for the 03/05/22 encounter (Appointment) with Samuel Heal, MD.       STOPPED PAIN MEDICATIONS:    Stopped Meds:13962::"None"     Insert on-line MyChart  Questionnaire Here By Typing .PQR.  If the patient did not complete questionnaire on-line, type in .EMRMYCHARTCOVID and select from the drop down menus    Past Medical History:   Diagnosis Date    Anxiety     Diverticulitis        Past Surgical History:   Procedure Laterality Date    EXPLORATORY LAPAROTOMY  2015    PR REMOVAL GALLBLADDER  2011    TONSILLECTOMY  1991       Family History   Problem Relation Name Age of Onset    Other (GI abnormality) Mother           PSYCHOSOCIAL HISTORY:     Social History     Socioeconomic History    Marital status: SINGLE   Tobacco Use    Smoking status: Never    Smokeless tobacco: Never   Substance and Sexual Activity    Alcohol use: Not Currently     Comment: stopped after gallbladder surgery    Drug use: Yes     Frequency: 3.0 times per week     Types: Marijuana     The patient  reports that he has never smoked. He has never used smokeless tobacco.    PROBLEM LIST:  Patient Active Problem List   Diagnosis    Chronic  abdominal pain    Anxiety    Degeneration of cervical intervertebral disc       PAIN TREATMENTS:   Previous Injection Procedures: Mr. Dicus  RECENT PROCEDURE:12797.  Length of [id number]      PREVIOUS DIAGNOSTIC STUDIES:  Images:  Pain Images Reviewed/Viewed:14926    :12015    MRI  C-spine: 02/20/2020  MR C-SPINE WITHOUT CONTRAST  EXAM DATE:  02/20/2020 11:34 AM.  COMPARISON: Radiographs of the cervical spine 04/14/2019, MRI cervical spine  12/31/2018     INDICATION: neck pain;Worsening of neck pain and arm numbness.     TECHNIQUE: Routine spine without protocol. Precontrast sagittal T1, T2 FSE,  STIR, axial T1 and T2.     FINDINGS:     Alignment: Straightened normal cervical lordosis which could reflect muscle  spasm or could be positional.     Vertebral body heights and marrow: Multilevel spondylosis with osteophytes,  facet and uncovertebral arthropathy, as well as endplate marrow  degenerative changes are seen. Otherwise the vertebral body heights and  marrow signal are unremarkable.  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     Spinal Cord:The craniocervical junction is unremarkable. The spinal cord is  normal.     Soft tissues: Normal.      At C2-3: Unremarkable.     At C3-4:Unremarkable.     At C4-5:Disc bulge and uncovertebral hypertrophy, causing mild narrowing of  the thecal sac, similar to previous exam.     At C5-6:Disc bulge with annular tear and uncovertebral hypertrophy, causing  moderate spinal stenosis. Moderately severe bilateral neuroforaminal  narrowing, slightly increased from prior exam.     At C6-7:Disc bulge with annular tear and uncovertebral hypertrophy, causing  moderate spinal stenosis and moderately severe bilateral neuroforaminal  narrowing.     At C7-T1:Unremarkable.     IMPRESSION:     1. Mild progression of degenerative changes resulting in moderate  spinal and moderately severe bilateral neuroforaminal stenosis at C5-6  and  C6-7.       Xr C-spine: 04/14/2019  C-SPINE 4+ VIEWS  EXAM DATE: 04/14/2019 10:11 AM  COMPARISON: MRI 2020.     INDICATION: Signs/Symptoms: neck pain     TECHNIQUE: AP and lateral neutral, flexion, extension views of the cervical  spine.     FINDINGS:     Alignment: There is normal alignment of the spine.     Vertebrae: No fractures or destructive changes. There is continued anterior  interbody spurring C4-5. No pathological movement is seen from flexion to  extension.     Prevertebral and paraspinal soft tissues: within normal limits.     IMPRESSION:     1. Continued degenerative spurs C4-5 without pathological movement.       Labs: The lab/study results below  were, were not:10341 reviewed.    Lab Results   Lab Name Value Date/Time    WBC 9.6 11/07/2019 03:57 PM    HGB 14.3 11/07/2019 03:57 PM    HCT 42.9 11/07/2019 03:57 PM    PLT 228 11/07/2019 03:57 PM       Lab Results   Lab Name Value Date/Time    NA 135 11/07/2019 03:57 PM    K 4.0 11/07/2019 03:57 PM    CL 99 11/07/2019 03:57 PM    CO2 25 11/07/2019 03:57 PM    BUN 7 (L) 11/07/2019 03:57 PM    CR 0.79 11/07/2019 03:57 PM    GLU 70 11/07/2019 03:57 PM       No results found for: PGLU      Lab Results   Lab Name Value Date/Time    AST 22 11/07/2019 03:57 PM    ALT 16 11/07/2019 03:57 PM    ALP 58 11/07/2019 03:57 PM    ALB 4.6 11/07/2019 03:57 PM    TP 7.7 11/07/2019 03:57  PM    TBIL 0.5 11/07/2019 03:57 PM       No results found for: HGBA1C    No results found for: A1CPOC    No results found for: INR    No results found for: INR    No results found for: BNP    No components found for: TROPONIN1      ***Urine Toxicology  No results found for: DRUGGCSCRNU  No results found for: AMPHETSCRNU  No results found for: BENZOSSCRNU  No results found for: COCAINESCRNU  No results found for: BARBSSCRNU      PHYSICAL EXAM:    Constitutional:  General Appearance:12749     PAIN SPECIFIC PHYSICAL  EXAM:12750    Psych:  Appearance/Cooperation: in no apparent distress, well developed and well nourished, non-toxic, in no respiratory distress and acyanotic, alert, oriented times 3, well groomed and dressed, cooperative and well hydrated  Attitude: pleasant   Behavior :normal  Eye Contact: normal  Attention Span: good  Speech: normal volume, rate, and pitch  Thought Process: logical and goal directed  Thought Content: no thought disorder or psychotic symptoms  Memory: good  Judgment: good  Relatedness: good          ASSESSMENT AND RECOMMENDATIONS/TREATMENT PLAN:   Please see the beginning of the note.        closing and work comp [employer] DISCLAIMER:   This note was scribed by Jac Canavan, SCRIBE, a trained medical scribe in the presence of Leanne Lovely M.D.  Electronically signed by Jac Canavan, SCRIBE, 03/04/2022    ***    PROVIDER DISCLAIMER:   This document serves as my personal record of services taken in my presence. It was created on 03/04/2022  on my behalf by the trained medical scribe listed above.     I have reviewed this document and agree that this note accurately reflects the history and exam findings, the patient care provided, and my medical decision making.    ***    *** Fellow time: *** and Attending time: ***        Mr. Monteverde: If you are reviewing this progress note and have questions about the meaning or medical terms being used, please schedule an appointment or bring it up at your next follow-up appointment.  Medical notes are a communication tool between medical professionals and require medical terms to be used for efficiency. You can also look up terms via on-line medical dictionaries such as Medline Plus at [email or web address]

## 2022-03-05 ENCOUNTER — Encounter: Payer: Self-pay | Admitting: ANESTHESIOLOGISTS

## 2022-03-05 ENCOUNTER — Ambulatory Visit: Payer: Medicaid (Managed Care) | Admitting: ANESTHESIOLOGISTS

## 2022-03-05 DIAGNOSIS — M5412 Radiculopathy, cervical region: Secondary | ICD-10-CM

## 2022-03-05 NOTE — Patient Instructions (Signed)
If you have a [place] HMO insurance (eg, [place] Healthnet, [place] Blue Shield, etc), your acupuncture benefits might run through [facility] ([facility]). You can contact [facility] ([phone]) to confirm your acupuncture benefits. If you have acupuncture benefits, they will give you a list of their preferred providers. You can self-refer to any of their preferred providers.     If you have a PPO insurance or any other commercial insurance, please contact your insurance company directly to verify acupuncture benefits.     Medicare currently covers acupuncture ONLY for chronic low back pain does if it meets all of these criteria:  non-specific chronic low back pain duration > 12 weeks; it is NOT associated with metastatic, inflammatory, or infectious disease; it is NOT associated with postoperative surgery or pregnancy.    Medi-Cal currently covers acupuncture for any chronic pain.        Here is a limited list of some acupuncturists in the community who do acupuncture with you as the only patient.     Towards the bottom of the list are places that offer "COMMUNITY ACUPUNCTURE". These places offer acupuncture at a nominal cost (see below for details)    NOTE:  None of the individuals or clinics on this list are affiliated with the [facility] or the [facility]. Furthermore, since we do not know any one of them, we are unable to endorse any one person in terms of his/her skills and qualifications. Understand that there are many other acupuncturists in the community, and you are encouraged to actively research their skills and qualifications.       [place]    [facility] [place]  No reviews ? Acupuncture Clinic  [address]  [phone]  [email or web address]    [facility]  [address]  [place], [place] [address]  Phone number [phone]  [email or web address]    [facility]  [address]  [place], [place]  [phone]  [email or web address]    [facility]  [address]  [place], [place] [address]  Phone number [phone]  [email or web address]    [facility]  [address]  [place], [place] [address]  Phone number [phone]  [email or web address]    [facility]  [address]  [place], [place] [address]  Phone number [phone]  [email or web address]          [place]    [facility]  [address]  [place], [place] [address]   [phone]   [email or web address]    [facility]  [address], [place], [place] [address]  [email or web address]  [phone]  [email or web address]    [place]    [facility] [place]  [address], [place]  [phone]  [email or web address]    [facility]  [address]  [place], [place] [address]  Phone number [phone]  [email or web address]    [place]    [facility]  [address]  [address]  [place], [place] [address]  [phone]  [email or web address]    [place]    [facility]  Acupuncture Clinic  Address: [address], [place], [place] [address]  [phone]  [email or web address]    [facility]  [address]  [place], [place] [address]  Phone number [phone]  [email or web address]        [place]    [facility]   George Wang   [place]   [phone]        COMMUNITY ACUPUNCTURE CLINICS: The goal of of these clinics is to make acupuncture accessible by providing affordable community-centered health care. Treatments are conducted in a group setting, but all acupuncture treatments are individualized and are performed in a quiet community space. This is the most common way  to see acupuncture performed in [place]. Community acupuncture clinics charge on a sliding scale. You decide what you can afford within their sliding scale. There is an additional  paperwork fee for your first appointment. It is up to you to contact the clinic to confirm the most current pricing.     [facility]  [address], [place]   [phone]   [email or web address]  [place] [facility]  [address], [place]   [phone]   [email or web address]  [facility]  [address], [place]   [phone]   [email or web address]  [facility]  [address] [place]   [phone]   [email or web address]    To learn more about community acupuncture in the region visit: [email or web address].[email or web address]#storylink=cpy

## 2022-03-28 ENCOUNTER — Encounter: Payer: Self-pay | Admitting: ANESTHESIOLOGISTS

## 2022-03-28 NOTE — Telephone Encounter (Signed)
From: Caro Hight  To: Natale Lay, MD  Sent: 03/28/2022 3:01 PM PDT  Subject: Changes since visit    I was able to get my transportation situated so I'd be able to do an in person visit to try and get that mri referral to go through. Would I need to go through the scheduling process again?

## 2022-03-28 NOTE — Telephone Encounter (Signed)
Last seen via VV by NS on 03/05/22  DISPOSITION AFTER TODAY'S VISIT: Follow up in person (preferred) after MRI C-spine completion with NS or Nav    Patient informed that he should contact nearest Radiology department to schedule MRI. I gave him Marlene Lard number where he did his last MRI.    This encounter needs no further provider action, please close the encounter.

## 2022-04-30 ENCOUNTER — Telehealth: Payer: Self-pay | Admitting: ANESTHESIOLOGISTS

## 2022-04-30 NOTE — Telephone Encounter (Signed)
Hi Dr. Candiss Norse,    The insurance denied the request for an MRI for reasons below.    The notes sent do not meet NIA's guidelines for Cervical Spine MRI. Thus, the   procedure is not shown to be medically needed. A physician reviewer made this   decision based on a review of the notes given (you have neck pain). Prior to an   approval, the following notes should be given: doctor's notes that say you did six   weeks of neck exercises (physical therapy, chiropractic treatments, or medically   directed home exercise program) in the last six months. We also need to know the   number of visits you went to. If you did this, the notes do not show the dates that you   did the exercises. NIA Clinical Guideline 040 for Cervical Spine MRI was used to make   this decision.    If patient has not already completed neck exercises then I will have to resubmit after completion.      Candelaria Stagers. [place] [facility]

## 2022-04-30 NOTE — Telephone Encounter (Signed)
Resubmit for authorization.

## 2022-04-30 NOTE — Telephone Encounter (Signed)
Spoke with patient. He has been doing medically directed home exercises  almost every day for the past 1.5 years for his neck.

## 2022-05-01 NOTE — Telephone Encounter (Signed)
Documentation added to prior auth request for review.    Samuel Cobb. [name] [facility]

## 2022-05-07 ENCOUNTER — Ambulatory Visit (HOSPITAL_BASED_OUTPATIENT_CLINIC_OR_DEPARTMENT_OTHER): Admitting: Gastroenterology

## 2022-06-02 ENCOUNTER — Ambulatory Visit (HOSPITAL_BASED_OUTPATIENT_CLINIC_OR_DEPARTMENT_OTHER): Admitting: Gastroenterology

## 2022-06-02 ENCOUNTER — Encounter (HOSPITAL_BASED_OUTPATIENT_CLINIC_OR_DEPARTMENT_OTHER): Payer: Self-pay | Admitting: Gastroenterology

## 2022-06-02 VITALS — BP 114/72 | HR 107 | Ht 67.0 in | Wt 162.1 lb

## 2022-06-02 NOTE — Progress Notes (Signed)
GASTROENTEROLOGY OUTPATIENT VISIT      REFERRING PROVIDER:  Lyda Kalata, MD      HISTORY OF PRESENT ILLNESS:    Samuel Cobb is a 36yr male who is referred to establish care.   Patient has chronic GI symptoms and abdominal pain.  He was followed by DR. Brichler, a gastroenterologist in [place], [place].  He was given the diagnosis of Crohn's disease.  He is currently on Remicade infusions.  He had multiple ER visits and hospital admission back in May 2023 for abdominal pain. I have no other records to review  He tells me he has hx of cholecystectomy in 2011 and laproscopy with lypsis of adhesions in 2015. He was living in [place] at the time.   Currently denies blood in the stools, mouth ulcers, or concerning rashes. He has 1 non bloody bowel movement/ day on average. Reports nausea lasting for 20-30 minutes after BM.       ROS:  Review of system is negative except for HPI     PMH:  Past Medical History:   Diagnosis Date    Anxiety     Diverticulitis        PSH:  Past Surgical History:   Procedure Laterality Date    EXPLORATORY LAPAROTOMY  2015    PR REMOVAL GALLBLADDER  2011    TONSILLECTOMY  1991       ALLERGIES:  Allergies   Allergen Reactions    Regal-Am [Quinine Sulfate] Other-Reaction in Comments     shake    Toradol [Ketorolac Tromethamine] Other-Reaction in Comments     shake    Tramadol Other-Reaction in Comments     shake    Zoloft [Sertraline Hcl] Palpitations       MEDS:    No outpatient medications have been marked as taking for the 06/02/22 encounter (Office Visit) with Orpah Melter, MD.         Current Outpatient Medications:     Buprenorphine (SUBUTEX) 2 mg Sublingual Tablet, 1 TABLET UNDER THE TONGUE AND ALLOW TO DISSOLVE EVERY 12 HRS SUBLINGUAL 30 DAYS, Disp: , Rfl:     Desipramine (NORPRAMIN) 10 mg Tablet, Take 1 tablet by mouth every day at bedtime., Disp: 30 tablet, Rfl: 11    Diazepam (VALIUM) 2 mg Tablet, TAKE0.5  1 TABLET BY ORAL ROUTE 2 TIMES EVERY DAY AS NEEDED, Disp:  , Rfl:     folic acid 800 mcg Tablet,   , Disp: , Rfl:     MEDROL, PAK, 4 mg tablet, Follow package instructions, Disp: 21 tablet, Rfl: 0    Pregabalin (LYRICA) 25 mg Capsule, Take 2 capsules by mouth 2 times daily., Disp: 120 capsule, Rfl: 2    Promethazine (PHENERGAN) 25 mg Tablet, Take 25 mg by mouth once daily if needed., Disp: , Rfl:       FAM HX:  Family History   Problem Relation Name Age of Onset    Other (GI abnormality) Mother     [phone]     SOC HX:  Social History     Tobacco Use    Smoking status: Never    Smokeless tobacco: Never   Substance Use Topics    Alcohol use: Not Currently     Comment: stopped after gallbladder surgery       PHYSICAL EXAM:    BP 114/72 (SITE: left arm, Orthostatic Position: sitting, Cuff Size: regular)   Pulse 107   Ht 1.702 m (5\' 7" )   Wt 73.5  kg (162 lb 2 oz)   SpO2 96%   BMI 25.39 kg/m     GA: Alert, NAD  Head: NC/AT PERRLA  Chest: CTAB, non labored breathing, no wheezing or ronchi.  Heart: regular rate and rhtym, no murmur  Abdomen: soft, non tender, non distended, no rebound, no guarding, no masses or hernias.  Neuro: AOx3, CN II-XII grossly intact, no focal deficits  Ext: No edema, normal ROM in all extremities  Psych: appropriate mood, cooperative.         LAB TESTS/STUDIES:    Lab Results   Lab Name Value Date/Time    AST 22 11/07/2019 03:57 PM    ALT 16 11/07/2019 03:57 PM    ALP 58 11/07/2019 03:57 PM    ALB 4.6 11/07/2019 03:57 PM    TP 7.7 11/07/2019 03:57 PM     Lab Results   Lab Name Value Date/Time    WBC 9.6 11/07/2019 03:57 PM    HGB 14.3 11/07/2019 03:57 PM    HCT 42.9 11/07/2019 03:57 PM    PLT 228 11/07/2019 03:57 PM       RELEVANT IMAGING/ ENDOSCOPIC STUDIES:      ASSESSMENT/ PLAN:     ICD-10-CM    1. Crohn's disease with complication, unspecified gastrointestinal tract location [facility])  K31.919           36 year old man who carries the diagnosis of likely colonic crohn's on infliximab therapy. He is transferring gastroenterologist due to insurance  coverage. symptoms appear stable at the time. I advised to obtain records from Dr. Donavan Burnet as soon as possible. Will renew infusion orders based on records.    Follow up 4 months. Okay for video visits given long distance.   The risks and benefits of the procedures were discussed in detail with the patient today to include the possibility of adverse reactions to medications used for anesthesia/sedation, bleeding, infection, perforation and/or damage to adjacent tissue during endoscopy.      Note: This note was partially created using Conservation officer, historic buildings. Some transcription errors may have escaped proofreading.    Orpah Melter, M.D.  Gastroenterology   [facility]

## 2022-06-02 NOTE — Patient Instructions (Addendum)
Obtain records from Dr. Donavan Burnet

## 2022-07-18 ENCOUNTER — Encounter: Payer: Self-pay | Admitting: ANESTHESIOLOGISTS

## 2022-07-18 DIAGNOSIS — M5412 Radiculopathy, cervical region: Secondary | ICD-10-CM

## 2022-07-18 DIAGNOSIS — M47812 Spondylosis without myelopathy or radiculopathy, cervical region: Secondary | ICD-10-CM

## 2022-07-18 NOTE — Telephone Encounter (Signed)
From: Caro Hight  To: Natale Lay, MD  Sent: 07/18/2022 3:24 PM PST  Subject: Mri referral     I saw on here that an mri was approved and valid until next month. I called the radiology department to schedule and they told me they only have an order for xrays and the mri was denied. I'm confused. It says on here that it was approved.

## 2022-07-22 NOTE — Telephone Encounter (Signed)
Referral: resubmit please

## 2022-07-31 NOTE — Telephone Encounter (Signed)
Can a new order please be resubmitted for the MRI?    Thank you    Candelaria Stagers. [place] - [facility] Management

## 2022-08-01 NOTE — Addendum Note (Signed)
Addended by: Marylyn Ishihara on: 08/01/2022 09:51 AM     Modules accepted: Orders

## 2022-08-01 NOTE — Telephone Encounter (Signed)
Referral: MRI reordered.

## 2022-08-07 NOTE — Telephone Encounter (Signed)
Prior auth request submitted to insurance.    Valyn Latchford M. Lynde Ludwig  PSR III  [facility] - Pain Management

## 2022-08-26 NOTE — Telephone Encounter (Signed)
Prior auth approved. Mychart message already sent to patient to notify of ready to schedule.      Candelaria Stagers. [facility] - [facility]

## 2022-11-18 ENCOUNTER — Encounter: Payer: Self-pay | Admitting: ANESTHESIOLOGISTS

## 2022-11-18 NOTE — Telephone Encounter (Signed)
Patient has been scheduled for in person fv with NS 12/22/22. I also read messages from nursing and NS and let him know we can see him sooner if he is able to get MRI done sooner.    Routing to referrals for fv auth.    Thank you,     Marrian Salvage   [facility]    PSR II

## 2022-11-18 NOTE — Telephone Encounter (Signed)
From: [name]  To: Levonne Lapping  Sent: 11/18/2022 11:42 AM PDT  Subject: Question about upcoming MRI    Hello. I'm not sure who to ask but while I was at physical therapy today the therapist asked if I could try and get the upper thoracic spine looked at during the [facility] as well? My entire right shoulder and arm has gotten progressively worse. My whole right arm goes completely numb almost any time I move. I've been to the emergency room for it and they just bundled it together with my other neck stuff. I'm not sure who I bring this to exactly. My primary has me seeing a chiropractor along with the PT but I'm not getting any kind of relief. Well, the pain in my shoulder has subsided but the numbness is getting worse at an alarming rate.

## 2022-11-18 NOTE — Telephone Encounter (Signed)
Pt's cervical MRI is scheduled on 6/13.

## 2022-11-18 NOTE — Telephone Encounter (Signed)
Scheduling: in person with a NS or ND next available

## 2022-11-21 NOTE — Telephone Encounter (Signed)
Faxed RAF request to patient's PCP. FV auth pending until RAF received      Shandon Burlingame M. Prarthana Parlin  PSR III  [facility] - Pain Management

## 2022-12-18 ENCOUNTER — Ambulatory Visit
Admission: RE | Admit: 2022-12-18 | Discharge: 2022-12-18 | Disposition: A | Discharge status: Home / Self Care | Payer: MEDICAID | Source: Ambulatory Visit | Attending: Diagnostic Radiology | Admitting: Diagnostic Radiology

## 2022-12-18 DIAGNOSIS — M4802 Spinal stenosis, cervical region: Secondary | ICD-10-CM | POA: Insufficient documentation

## 2022-12-18 DIAGNOSIS — M50321 Other cervical disc degeneration at C4-C5 level: Secondary | ICD-10-CM | POA: Insufficient documentation

## 2022-12-18 DIAGNOSIS — M5412 Radiculopathy, cervical region: Secondary | ICD-10-CM | POA: Insufficient documentation

## 2022-12-18 DIAGNOSIS — M47812 Spondylosis without myelopathy or radiculopathy, cervical region: Secondary | ICD-10-CM | POA: Insufficient documentation

## 2022-12-19 DIAGNOSIS — M50321 Other cervical disc degeneration at C4-C5 level: Secondary | ICD-10-CM

## 2022-12-19 DIAGNOSIS — M4802 Spinal stenosis, cervical region: Secondary | ICD-10-CM

## 2022-12-21 NOTE — Progress Notes (Unsigned)
[facility]  Department of Anesthesiology and Pain Medicine  [address]  [place], [place] [address]  Phone: [phone]    Fax [phone]    Date: 12/22/2022    Re: Samuel Cobb  MR#: [id number]  Date of Birth: 06-29-1986  Age: 63yr    Requesting physician: Lyda Kalata, MD    Below is the summary of the visit with our mutual patient, Samuel Cobb. Thank you for your cooperation in his care.       Assessment and Differential Diagnosis:  No diagnosis found.     No orders of the defined types were placed in this encounter.           ENCOUNTER SUMMARY:  Samuel Cobb is a 37yr -old male who  has a past medical history of Anxiety and Diverticulitis. AND  has a past surgical history that includes tonsillectomy (1991); pr removal gallbladder (2011); and exploratory laparotomy (2015).     Samuel Cobb was seen today in follow up at the [facility] for neck pain.       Patient was last seen in Pain Medicine clinic on 03/06/23 with Dr. Thedore Mins and at that time recommended:   The primary encounter diagnosis was Cervical radiculitis. A diagnosis of Cervical radicular pain was also pertinent to this visit. Treatment options discussed with patient. Can consider scheduling cervical epidural steroid injection in the future once patient can secure transportation and once repeat MRI imaging done.      Recommend acupuncture as a possible treatment option with list of community acupuncturists in [place].      Continue with at home exercises and stretches for neck pain. Can consider other nerve type medications in the future under the discretion of patient's PCP.      In addition, we will obtain MRI of cervical spine and x-ray of cervical spine to further evaluate for new worsening symptoms.      Recommendations  RECOMMENDATIONS/TREATMENT PLAN:   1. Cervical radiculitis  - Can consider scheduling cervical epidural steroid injection in the future once patient can secure transportation  -  Recommend acupuncture as a possible treatment option with list of community acupuncturists in [place]  - Continue with at home exercises and stretches for neck pain  - Can consider other nerve type medications in the future under the discretion of patient's PCP  - MR C-SPINE WITHOUT CONTRAST; Future  - C-SPINE 4+ VIEWS; Future     2. Cervical radicular pain  - See above   - MR C-SPINE WITHOUT CONTRAST; Future  - C-SPINE 4+ VIEWS; Future          Additional Clinical Information    Procedure History: (per chart review)  Trigger Point Injections Bilateral cervical paraspinals, trapezius, and suprascapularis on 06/21/19        On review of recent imaging, MRI C-spine on 12/18/22 shows:   1. Multilevel degenerative changes of the cervical spine with moderate  spinal canal stenosis at C4-C5, and severe spinal canal stenosis at C5-C6  and C6-C7 with resultant cord compression, slightly progressed compared to  prior MRI in 2021.  2. Apparent abnormal cord signal at C5-C6 and lesser degree at C6-7  felt to be mostly artifactual.  3. Severe bilateral foraminal narrowing at C5-C6 and C6-C7, unchanged  compared to prior MRI.        Current pain medication regimen:   []  NSAIDs:This patient does not have an active medication from one of the medication  groupers.  []  Acetaminophen:This patient does not have an active medication from one of the medication groupers.   []  Gabapentinoids:This patient does not have an active medication from one of the medication groupers.  []  Muscle relaxants:This patient does not have an active medication from one of the medication groupers.   [x]  Opioids:Buprenorphine - 2 mg   []  SNRi:This patient does not have an active medication from one of the medication groupers.  []  Topical Ointments   []  Others     Anticoagulants:   Current Anticoagulation Medications/Supplements    None         Previous  Medications  Gabapentin   Medrol dose pack with no benefit      Other prior conservative therapies: (( - / + / ++) = no improvement / mild / significant  []  Physical Therapy  Physical Therapy:15202   []  TENS  []  Acupuncture   []  Chiropractic Services   []  Heat/ice   []  [facility] Team with ***  []  Other            Today, we discussed Samuel Cobb's ***.          Patient's physical exam today is notable for ***.     IMPRESSION  Based on the pertinent history, physical, imaging, and results, There were no encounter diagnoses.  Multiple treatment options were discussed with the patient. At this time, we will schedule him for ***, and refer to ***. Will also prescribe *** today. We encourage patient to continue ***physical therapy/home exercise regimen, and current medication regimen. May consider *** in the future. Patient was involved in this shared decision making and agrees with our plan.        RECOMMENDATIONS/TREATMENT PLAN:    Please type in the dot phrase .DIAGMED only AFTER you have entered all of the pertinent diagnoses. Since this dot phrase is NOT refreshable, you will need to manually enter any diagnosis added after-the-fact.    DISPOSITION AFTER TODAY'S VISIT:   PRIORITY SCHEDULING:  covid pain priority scheduling:20684  ATTENDING: Dr.  Ottis Stain PROVIDER:[id number]  PROCEDURE: ***  SIDE(S):  covid pain UJWJ:19147  IMAGING:  Imaging:20685  STEROID:  :15035  IV ANXIOLYSIS:  yes/no/unknown:[id number]  DRIVER:  WGN/FA:[id number]  SPECIAL INSTRUCTIONS:  covid pain special instructions:20686::"no special instructions"    E-CONSENT:  completed or not completed:23691::"completed today."  PROCEDURE ORDER:  completed or not completed:23691::"completed today."        Dear Dr. Janalyn Shy, Samuel Hansen, MD    It was a  pleasure to see your patient, Samuel Cobb, today in follow up at the [facility] for Pain Medicine.  As you know, Samuel Cobb is a 37yr -old male with a chief complaint of:    No chief complaint on file.      Samuel Cobb is here for  OPMED Presents for:10550  LOCATION:12747 pain, which started  NUMBERS:14192  Time Units:10300 ago. He associates the onset of pain with  Lumbosacral Injury:13964.    PAIN QUALITY  The patient describes the pain as  pins and needles,sharp,numbness,shooting,electric-like,throbbing,pressure    PAIN INTENSITY - Visual Analog Scale (VAS): 0-10 Scale (0 = No Pain, 10 = Worst Imaginable Pain)  Currently the patient has a VAS of  9/10.    RELIEVING AND AGGRAVATING FACTORS  The patient was asked if any of the following activities either alleviate or exacerbate their pain: lying down, standing, sitting walking, exercise, medications, relaxation, thinking about something else, coughing/sneezing, urination, or bowel movements  The patient states that the pain:  is relieved by:  nothing    is aggravated by:    lying down  standing  sitting  walking  exercise (if applicable)  coughing/sneezing  bowel movements      Samuel Cobb notes pain located in the areas indicated in the pain location diagram below.     Pain Location Diagram  No images are attached to the encounter.     ALLERGIES:    Allergies   Allergen Reactions    Regal-Am [Quinine Sulfate] Other-Reaction in Comments     shake    Toradol [Ketorolac Tromethamine] Other-Reaction in Comments     shake    Tramadol Other-Reaction in Comments     shake    Zoloft [Sertraline Hcl] Palpitations       CURRENT MEDICATIONS:   No outpatient medications have been marked as taking for the 12/22/22 encounter (Appointment) with Collene Gobble, MD.       Past Medical History:   Diagnosis Date    Anxiety     Diverticulitis        Past Surgical History:   Procedure Laterality Date    EXPLORATORY LAPAROTOMY  2015     PR REMOVAL GALLBLADDER  2011    TONSILLECTOMY  1991       Family History   Problem Relation Name Age of Onset    Other (GI abnormality) Mother           PSYCHOSOCIAL HISTORY:   Social History     Socioeconomic History    Marital status: SINGLE   Tobacco Use    Smoking status: Never    Smokeless tobacco: Never   Substance and Sexual Activity    Alcohol use: Not Currently     Comment: stopped after gallbladder surgery    Drug use: Yes     Frequency: 3.0 times per week     Types: Marijuana     The patient  reports that he has never smoked. He has never used smokeless tobacco.    Samuel Cobb past medical, family, and social history were reviewed.    PROBLEM LIST:  Patient Active Problem List   Diagnosis    Chronic abdominal pain    Anxiety    Degeneration of cervical intervertebral disc       PREVIOUS DIAGNOSTIC STUDIES:  Images: The report of the image(s) indicated below was reviewed.     MR C-SPINE WITHOUT CONTRAST  EXAM DATE:  12/18/2022 11:48 AM.  COMPARISON: Spine MRI February 20, 2020.     INDICATION: Many years of neck pain, worsening, failed conservative  measures like PT.  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 Precontrast sagittal T1, T2 FSE,  STIR, axial merge and T2.     FINDINGS:     Alignment: There is straightening of the normal cervical lordosis. Mild  retrolisthesis of C4-C5, C5-C6 and C6-C7. Minimal anterolisthesis of C7-T1.  Alignment not significantly changed compared to prior.     Vertebral body heights and marrow: Multilevel spondylosis with osteophytes,  facet and uncovertebral arthropathy, as well as endplate marrow  degenerative changes are seen. No acute fracture or suspicious marrow  replacing lesions.       Spinal Cord:The craniocervical junction is unremarkable.     Apparent abnormal cord signal at C5-C6 and lesser degree at C6-7 felt to be  mostly artifactual.  Soft tissues: Trace mucosal thickening of the partially visualized  maxillary sinuses.     At C2-3: Trace posterior disc osteophyte complex, uncovertebral spurring  and facet hypertrophy without spinal canal or foraminal narrowing.     At C3-4:Trace posterior disc osteophyte complex, uncovertebral spurring and  facet hypertrophy without spinal canal or high-grade foraminal narrowing.     At C4-5:Prominent posterior disc osteophyte complex, uncovertebral spurring  and facet hypertrophy causing moderate effacement of ventral CSF and cord  flattening and moderate spinal canal stenosis. Moderate right and mild left  foraminal narrowing.     At C5-6:Posterior disc osteophyte complex, uncovertebral spurring and facet  hypertrophy along with ligamentum flavum infolding causing severe spinal  canal stenosis and cord compression. Severe bilateral foraminal narrowing.     At C6-7:Posterior disc osteophyte complex, uncovertebral spurring and facet  hypertrophy causing severe spinal canal stenosis and cord compression.  Severe bilateral foraminal narrowing.     At C7-T1: Mild uncovertebral spurring and facet hypertrophy without spinal  canal stenosis. Mild left foraminal narrowing.      IMPRESSION:     1. Multilevel degenerative changes of the cervical spine with moderate  spinal canal stenosis at C4-C5, and severe spinal canal stenosis at C5-C6  and C6-C7 with resultant cord compression, slightly progressed compared to  prior MRI in 2021.     2. Apparent abnormal cord signal at C5-C6 and lesser degree at C6-7  felt to be mostly artifactual.     3. Severe bilateral foraminal narrowing at C5-C6 and C6-C7, unchanged  compared to prior MRI.      Labs: The lab/study results below were reviewed.   Lab Results   Component Value Date    BUN 7 (L) 11/07/2019    CR 0.79 11/07/2019    PLT 228 11/07/2019    WBC 9.6 11/07/2019    NA 135 11/07/2019    K 4.0 11/07/2019    AST 22 11/07/2019    ALT 16 11/07/2019        PHYSICAL EXAM:    There were no vitals filed for this visit.  There is no height or weight on file to calculate BMI.   Constitutional: Normally developed, no deformities,well groomed., healthy, alert, no distress, pleasant affect, cooperative  Skin:  Skin color normal.  Head: normocephalic, atraumatic   Eyes:  conjunctivae clear. EOM's intact. sclerae normal.  Respiratory: non-labored respirations  Psych:Appearance/Cooperation: in no apparent distress, well developed and well nourished, non-toxic, in no respiratory distress and acyanotic, alert, well groomed and dressed, and cooperative  Attitude: pleasant   Behavior :normal  Eye Contact: normal  Attention Span: good  Speech: normal volume, rate, and pitch  Thought Process: logical and goal directed  Relatedness: good      PAIN SPECIFIC PHYSICAL EXAM:12750        MEDICAL DECISION MAKING    Records Reviewed:   Lab/study reports reviewed above were important and necessary because subsequent medical and treatment recommendations required review of the above lab/study reports  Images viewed/reviewed above were important and necessary because subsequent medical and treatment recommendations required review of the above image(s)  The reports by noted providers in the subjective portions of the history were reviewed. This was important and useful because the reviewed notes clarified the reasons for the recommended treatment and the patient's history was corroborated in the reviewed notes.    Review of the Risk of Comorbidities:  At this juncture, we believe that the patient's constitutional status adds moderate risk and complexity  to our proposed evaluation and treatment.   procedures ordered:12038   Pertinent comorbid conditions include  has a past medical history of Anxiety and Diverticulitis.    ASSESSMENT AND RECOMMENDATIONS/TREATMENT PLAN:   Please see the beginning of the note.     - The patient was instructed and educated on all aspects of the plan of care.   The patient acknowledged the plan of care.      ***         Samuel Cobb: If you are reviewing this progress note and have questions about the meaning or medical terms being used, please schedule an appointment or bring it up at your next follow-up appointment.  Medical notes are a communication tool between medical professionals and require medical terms to be used for efficiency. You can also look up terms via on-line medical dictionaries such as Medline Plus at [employer]ments.it.html

## 2022-12-22 ENCOUNTER — Encounter: Payer: Self-pay | Admitting: ANESTHESIOLOGISTS

## 2022-12-22 ENCOUNTER — Ambulatory Visit: Payer: MEDICAID | Attending: ANESTHESIOLOGISTS | Admitting: ANESTHESIOLOGISTS

## 2022-12-22 VITALS — BP 117/82 | HR 105 | Temp 98.1°F | Resp 18 | Ht 67.0 in | Wt 163.0 lb

## 2022-12-22 DIAGNOSIS — M5412 Radiculopathy, cervical region: Secondary | ICD-10-CM | POA: Insufficient documentation

## 2022-12-22 DIAGNOSIS — M4802 Spinal stenosis, cervical region: Secondary | ICD-10-CM | POA: Insufficient documentation

## 2022-12-22 DIAGNOSIS — M4722 Other spondylosis with radiculopathy, cervical region: Secondary | ICD-10-CM | POA: Insufficient documentation

## 2022-12-22 DIAGNOSIS — M47812 Spondylosis without myelopathy or radiculopathy, cervical region: Secondary | ICD-10-CM | POA: Insufficient documentation

## 2022-12-22 DIAGNOSIS — Z79899 Other long term (current) drug therapy: Secondary | ICD-10-CM | POA: Insufficient documentation

## 2022-12-22 MED ORDER — METHYLPREDNISOLONE 4 MG TABLETS IN A DOSE PACK
ORAL_TABLET | ORAL | 0 refills | Status: AC
Start: 2022-12-22 — End: 2022-12-27

## 2022-12-22 NOTE — Telephone Encounter (Signed)
Appt scheduled 12/22/22 with NS.    This encounter needs no further provider action, please close the encounter.

## 2022-12-22 NOTE — Progress Notes (Deleted)
Samuel Cobb is 17yr year old male who has a history chronic neck pain who is here today for follow up of chronic neck and right upper extremity symptoms.    He was last seen in clinic 05/09/2020 for a cervical epidural steroid injection which she reports provided less than 20% benefit.  He reports that since then he has had ongoing symptoms and reports recently having a flare of his symptoms earlier this year.  He now reports having pain radiating into the right upper limb with associated numbness/tingling and weakness in the right upper extremity.  He reports that recently he has had trouble gripping/holding objects and reports that he has had numbness/tingling in his 1st, 2/3 digit.  He reports that he has had a prior electrodiagnostic testing in 2021 which showed no evidence of a peripheral neuropathy at that time including median/ulnar nerve entrapment.  We reviewed his MRI imaging which shows multilevel degenerative changes including severe spinal canal stenosis at C5/C6 and C6/C7. Based on the history, exam, and available imaging results, the primary pain generator(s) include right sided cervical radiculopathy (C6>C7) in the setting of radicular pain in a dermatomal distrubution with corresponding imaging results.      The primary encounter diagnosis was Cervical radiculitis. A diagnosis of Cervical radiculopathy was also pertinent to this visit..     Based on the above mentioned pain generators, we discussed the following plan as mentioned below.

## 2022-12-22 NOTE — Nursing Note (Signed)
Patient name and DOB verified. Patient roomed and vital signs obtained. Medication allergies and pain level verified. Tobacco use history reviewed.

## 2022-12-23 NOTE — Progress Notes (Signed)
-   The patient was seen, evaluated, and care plan was developed with the pain fellow, Dr. Khan, on the date indicated in the Pain fellow's note. I agree with the findings and plan as outlined in the Pain fellow's note.  - The patient was instructed and educated on all aspects of the plan of care.  The patient acknowledged the plan of care.  - Total time I spent in care of this patient today (including face-to-face time with the patient, review of the medical records today both before and after the visit, charting; but excluding time spent on other billable services) was 34 minutes. This is non-overlapping time from any other health care professional time statement that may be included on this date of service.    Karson Reede S Lalaine Overstreet, MD  Department of Anesthesiology & Pain Medicine  Attending    Note Electronically Signed By:  Jaira Canady S Calub Tarnow, MD

## 2023-02-10 ENCOUNTER — Encounter: Payer: Self-pay | Admitting: ANESTHESIOLOGISTS

## 2023-02-10 NOTE — Telephone Encounter (Signed)
From: [name]  To: Levonne Lapping  Sent: 02/10/2023 9:50 AM PDT  Subject: Records and referral       Good morning. My primary care doctor says she is going to get in touch with [facility] to get my records from my treatment there. For some reason she wouldn't take it from me right there. I'm only skeptical because this isn't the first time I've been told a doctor at this center was going to be doing this and not once have they been successful. I tried explaining to her that I think it's a network thing (I don't really know) but she refused to listen to me or even look at the results through the [facility] app. I just want to make sure they are able to get what they need. Also, I still haven't heard anything from the neurologist about possible surgery. Thank you.

## 2023-02-10 NOTE — Progress Notes (Signed)
Provided info to medical records dept.     This encounter needs no further provider action, please close the encounter.

## 2023-02-27 ENCOUNTER — Other Ambulatory Visit: Payer: Self-pay | Admitting: PHYSICIAN ASSISTANT

## 2023-02-27 DIAGNOSIS — M542 Cervicalgia: Secondary | ICD-10-CM

## 2023-02-27 NOTE — Progress Notes (Unsigned)
NEUROLOGICAL SPINE- SURGERY   [address], [address]  [place], [place] [address]    Neurosurgery Spine Clinic     Date of visit: 03/02/2023   No chief complaint on file.       Dear Dr. Thedore Mins,    Thank you for this referral.  ***     HPI   Samuel Cobb  is a 37yr old male who comes in for evaluation of: *** pain, radiating to *** ongoing since *** inciting injury or event.  g pain radiating into the right upper limb with associated numbness/tingling and weakness in the right upper extremity. He reports that recently he has had trouble gripping/holding objects and reports that he has had numbness/tingling in his 1st, 2/3 digit.     *** Denies any bowel, bladder, or sexual dysfunction. Denies any saddle anesthesia.     Pain intensity is currently ***/10 on pain scale.   The quality of pain is described as:   PAIN QUALITY:12735.   The pain is present  FREQUENCIES:5330.    Relieving factors:  HBC PAIN AGG PALL CBJSEGB:15176  Aggravating factors:  HBC PAIN AGG PALL HYWVPXT:06269  Activity:  Pain limits  PAIN ACTIVITIES:12178     Functional impairments:  Yes No:952158::"No"  Assistive devices:  assistivedevices:30951:a  ***  Profession/ Hobbies: ***    BMD: ***  Tx: ***    Tobacco/Nicotine: ***     Current / Previous treatment    Current pain medication use includes: *** buprenorphine 2mg , duloxetine, amitriptyline   Tried/failed: Medrol dosepack    Prior physical therapy? ***         Seen by PAIN specialist?  Yes, [facility]  TPI Bilateral cervical paraspinals, trapezius, and suprascapularis on 06/21/19  C7-T1 Midline IL ESI on 05/09/20; <20% relief     Prior spine surgery? ***    PMH:     Tobacco Use History[1]    ALLERGIES:     Regal-Am [Quinine Sulfate]    Other-Reaction in Comments    Comment:shake  Toradol [Ketorolac Tromethamine]    Other-Reaction in Comments    Comment:shake  Tramadol    Other-Reaction in Comments    Comment:shake  Zoloft [Sertraline Hcl]    Palpitations     PMH: Past Medical  History[2]   PSH: Past Surgical History[3]    ROS: Complete ROS was performed and is negative except for what is noted in the HPI.     Physical Exam    There were no vitals taken for this visit.   General Appearance:  GENERAL APPEARANCE:50  Mental status: Alert and oriented.  GCS: GCS 15    ***  Palpation: ***  ROM: ***    Strength   Deltoid  C5 Biceps   C5 Triceps   C7 Wrist Flexion  C7 Wrist Extension  C6 Hand Intrinsics  C8, T1   Right  5/5 5/5 5/5 5/5 5/5 5/5   Left  5/5 5/5 5/5 5/5 5/5 5/5      Hip Flexion  L2  Knee Extension  L3, L4  Knee Flexion  Dorsi  flexion   L5 Great Toe Extension  L5  Plantar Flexion  S1    Right  5/5 5/5 5/5 5/5 5/5 5/5   Left  5/5 5/5 5/5 5/5 5/5 5/5     Reflexes   Biceps  C5 Triceps  C7 Brachio  Radialis  C6 Hoffman's Trmner   Right  2+ 2+ 2+ neg neg   Left  2+ 2+ 2+  neg neg      Patella   L4 Achilles   S1 Clonus  Babinski   Right  2+ 2+ neg down   Left  2+ 2+ neg down     Straight Raise Leg Test: negative*** positive***   Sensory: Sensation intact along bilateral upper and lower extremities ***  Gait: Heel - toe walking is done  w-w/o:5700::"with " difficulty. Tandem gait done  w-w/o:5700 difficulty.    Romberg test:  POSITIVE-NEGATIVE:12811    Imaging   Cervical 4 views , 03/02/2023  ***    MRI Cervical Spine, 12/18/2022  IMPRESSION:   1. Multilevel degenerative changes of the cervical spine with moderate spinal canal stenosis at C4-C5, and severe spinal canal stenosis at C5-C6 and C6-C7 with resultant cord compression, slightly progressed compared to prior MRI in 2021.   2. Apparent abnormal cord signal at C5-C6 and lesser degree at C6-7 felt to be mostly artifactual.  3. Severe bilateral foraminal narrowing at C5-C6 and C6-C7, unchanged compared to prior MRI.     Ancillary studies:    Ancillarystudies:30955 ***    Dr  NeuroAttendings:[id number] interpreted the images independently. ***    Assessment   Samuel Cobb is a 37yr old male with PMH of  has a past medical history of  Anxiety and Diverticulitis. *** presenting for ***.     ***    Patient was seen and evaluated with Attending  NeuroAttendings:[id number] who interpreted the imaging, exam and symptoms with patient. ***  Plan     We discussed the natural history of the disease process and all questions were answered.   We discussed possible treatment measures including conservative and surgical treatment options.          Surgical recommendations:  Surgical recommendation:31525  Diagnostics: ***  Referrals: ***  ***  No orders of the defined types were placed in this encounter.      I informed patient about red flag signs/symptoms and ED precautions, including but not limited to any new/worsening bowel or bladder dysfunction, saddle anesthesia, severe new or severely worsening neurologic deficits (paresthesias/numbness/weakness), or inability to walk.      Patient verbalized understanding of the above and is in agreement of the plan.  All questions and concerns were addressed.      Randi Mehlhorn was encourage to contact us if any further questions or concerns should arise.    For clinic use:   No follow-ups on file.     X-rays Needed:  YES/NO:200010     Total time I spent in care of this patient today (excluding time spent on other billable services) was *** minutes.      APP: Eldridge Scot, PA-C  Neurological Surgery Department  [facility]    Attending:  NeuroAttendings:[id number]   Neurological Surgery   [place] [facility]          [1]   Social History  Tobacco Use   Smoking Status Never   Smokeless Tobacco Never   [2]   Past Medical History:  Diagnosis Date    Anxiety     Diverticulitis    [3]   Past Surgical History:  Procedure Laterality Date    EXPLORATORY LAPAROTOMY  2015    PR REMOVAL GALLBLADDER  2011    TONSILLECTOMY  1991

## 2023-03-02 ENCOUNTER — Ambulatory Visit (HOSPITAL_BASED_OUTPATIENT_CLINIC_OR_DEPARTMENT_OTHER): Payer: MEDICAID | Admitting: PHYSICIAN ASSISTANT

## 2023-03-02 ENCOUNTER — Ambulatory Visit
Admission: RE | Admit: 2023-03-02 | Discharge: 2023-03-02 | Disposition: A | Discharge status: Home / Self Care | Payer: MEDICAID | Source: Ambulatory Visit | Attending: Diagnostic Radiology | Admitting: Diagnostic Radiology

## 2023-03-02 ENCOUNTER — Encounter: Payer: Self-pay | Admitting: PHYSICIAN ASSISTANT

## 2023-03-02 VITALS — BP 121/83 | HR 89 | Temp 97.1°F | Resp 16 | Ht 67.0 in | Wt 160.5 lb

## 2023-03-02 DIAGNOSIS — M4802 Spinal stenosis, cervical region: Secondary | ICD-10-CM | POA: Insufficient documentation

## 2023-03-02 DIAGNOSIS — M4722 Other spondylosis with radiculopathy, cervical region: Secondary | ICD-10-CM | POA: Insufficient documentation

## 2023-03-02 DIAGNOSIS — R260 Ataxic gait: Secondary | ICD-10-CM | POA: Insufficient documentation

## 2023-03-02 DIAGNOSIS — R278 Other lack of coordination: Secondary | ICD-10-CM

## 2023-03-02 DIAGNOSIS — M5412 Radiculopathy, cervical region: Secondary | ICD-10-CM

## 2023-03-02 DIAGNOSIS — G959 Disease of spinal cord, unspecified: Secondary | ICD-10-CM

## 2023-03-02 DIAGNOSIS — M542 Cervicalgia: Secondary | ICD-10-CM | POA: Insufficient documentation

## 2023-03-02 NOTE — Nursing Note (Signed)
Identified patient using name and date of birth.  Vital signs were taken.        Alline Pio Lisa Eboni Coval,MA

## 2023-03-05 ENCOUNTER — Ambulatory Visit: Payer: MEDICAID | Attending: SURGERY/NEUROLOGIC | Admitting: PHYSICIAN ASSISTANT

## 2023-03-05 ENCOUNTER — Encounter: Payer: Self-pay | Admitting: SURGERY/NEUROLOGIC

## 2023-03-05 DIAGNOSIS — G9589 Other specified diseases of spinal cord: Secondary | ICD-10-CM

## 2023-03-05 DIAGNOSIS — M5412 Radiculopathy, cervical region: Secondary | ICD-10-CM

## 2023-03-05 DIAGNOSIS — G959 Disease of spinal cord, unspecified: Secondary | ICD-10-CM

## 2023-03-05 DIAGNOSIS — M4802 Spinal stenosis, cervical region: Secondary | ICD-10-CM
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
## 2023-03-05 NOTE — Addendum Note (Signed)
Addended by: Christin Bach on: 03/05/2023 09:24 AM     Modules accepted: Orders

## 2023-03-06 ENCOUNTER — Telehealth: Payer: Self-pay | Admitting: SURGERY/NEUROLOGIC

## 2023-03-06 DIAGNOSIS — Z131 Encounter for screening for diabetes mellitus: Secondary | ICD-10-CM

## 2023-03-06 DIAGNOSIS — D689 Coagulation defect, unspecified: Secondary | ICD-10-CM

## 2023-03-06 DIAGNOSIS — Z01818 Encounter for other preprocedural examination: Secondary | ICD-10-CM

## 2023-03-06 LAB — PTED- OPIOID SAFETY

## 2023-03-06 LAB — PTED- SURGERY PREPARATION

## 2023-03-06 NOTE — Telephone Encounter (Signed)
Surgeon:  Samuella Cota     Surgery:  C5-7 ACDF     Imaging:  MRI C-Spine dated 12/18/22 - archived.     Additional Imaging:  C-Spine XR 4+ views 03/02/23     SURGERY SCHEDULED FOR:   06/26/23    -  patient agrees to this date.  Patient is willing to move up.         MEDICAL HISTORY    Patient states the following:     - Patient confirms:  N/A    - Patient denies:  DM2, HTN, COPD, Osteoporosis/Osteopenia, Heart Murmur, Heart or Lung Surgery, Stent Placement, Pacemaker, Internal Devices, Sleep Apnea, Nicotine use, Anesthesia problems.      - Patient denies nicotine use and is aware to not use nicotine products for at least 6 weeks prior to surgery and 1 year post spine surgery.     - Patient denies active cardiac symptoms (chest pain/pressure/tightness, shortness of breath).     - Patient BMI <35 (25.2)    - Patient A1c <7.5 Denies DM2, will place new order      - Patient denies taking Belbuca, Suboxone, Subutex, Methadone, Opiates.* Currently taking buprenorphine sublingual 4 mg, 3x daily prescribed by PCP.  - ASA Use:  Patient denies taking.  - Blood Thinner Use:  Patient denies ever using.  - NSAID Use: Patient denies taking.  - Vitamins/Herbal Supplements: Patient denies taking.  - Patient denies being on hormones.  - Patient denies taking oral steroids or receiving steroid injections.  - Last Steroid Injection . Patient aware no further steroid injections 30 days prior to spine surgery.     PREVIOUS THERAPIES:    - Physical Therapy:  Yes  - Chiropractic care:  Yes  - Acupuncture:  No     PATIENT NEEDS TO DO THE FOLLOWING:      - PCP Clearance: Needed; patient will call back with appointment and name, NP at [facility] in [place]     Phone: [phone]       - Pre-op labs (CBC, CMP, HbgA1c, PT/INR, PTT, UA with Culture if indicated, Prealbumin) Will complete at [facility] in [place]    - ECG Needed: Yes, will complete  with PCP      CARE PLAN:    - Mother to provide transportation and care giving.     - Denies need for referral for SNF placement after surgery.     - No [facility] referral    - Patient to stay at home to recover after surgery    - Lives 1 hour 15 minutes/miles from [facility].    - Discussed post op activity restrictions, pre-post operative surgical care and provided patient with copy of written instructions/restrictions.  - Patient verbalized understanding of pre-op education/instructions, post op care/restrictions, agrees with plan and displays no barriers to learning.   - Patient had no further questions or concerns.     Kem Kays, RN, BSN  Ambulatory Patient Care Resources  Please route all responses to [facility] ,do not route to me personally. I am a Hospital doctor.

## 2023-03-30 ENCOUNTER — Telehealth: Payer: Self-pay | Admitting: SURGERY/NEUROLOGIC

## 2023-03-30 NOTE — Telephone Encounter (Addendum)
Sent PCP pain agreement letter with Belenda Cruise Tom's recommendations to fax #[phone]:    Samuel Noble, PA to You    Thank you for the heads up. I did know about his prescriptions I think. We should send a pain contract letter to the PCP and make sure they know that we'd recommend he be transitioned off of buprenorphine and onto immediate release opioid rx in preparation of surgery 1 week in advance. Belenda Cruise  4 days ago    Kem Kays, RN, BSN  Ambulatory Patient Care Resources  Please route all responses to [facility] ,do not route to me personally. I am a Hospital doctor.

## 2023-03-30 NOTE — Telephone Encounter (Signed)
9/23 1354 - Spoke to Jory at PCP office. Per Lia Hopping, fax may still be in their inbox and not scanned into pt's chart yet. Jory provided new fax number.    9/23 1400 - Faxed pain agreement letter to [phone].    Allyne Gee, RN, BSN

## 2023-04-02 NOTE — Telephone Encounter (Signed)
9/26 1521 - Spoke with Maralyn Sago at PCP office. Per Maralyn Sago, received pain agreement letter. Maralyn Sago will send message to NP to review, sign, and fax back.    Allyne Gee, RN, BSN

## 2023-04-13 NOTE — Telephone Encounter (Addendum)
Surgery:   C5-7 ACDF    Pre-surgical tasks:    PCP -         EKG - Completed 05/13/23      Cardiology -   Cleared 11/6        Pain letter          Imaging -   MRI C Spine - 12/18/2022 archived      Labs - Completed 11/18 under "media" tab 11/20  Abnormals:  UA trace ketones, A1C 5.8

## 2023-04-16 ENCOUNTER — Telehealth: Payer: Self-pay

## 2023-04-16 NOTE — Telephone Encounter (Signed)
FYI: No further action needed with regard to this encounter/call. Please close encounter if no other recommendations/advice is warranted.   04/16/23  Pain management received dated 04/09/23 Waldron Session, FNP. Rn advised, uploaded to Careers information officer and procedure pass updated.  No further action needed, encounter closed.     Darleene Cleaver, LVN   [facility]  Department Phone: [phone]  Desk Phone: [phone]  Nursing Fax: [phone]  Please send all EMR Responses to our work department pool:   [facility]

## 2023-04-16 NOTE — Telephone Encounter (Addendum)
FYI only - no action required / Please close encounter if no other recommendations     Received pain agreement letter, Anthoney Harada, FNP will manage post-op pain medications. Updated in pre-op tasks TE and sent to [place], [place].    04/16/23@146PM  Spoke with Vincenza Hews at PCP office, per Vincenza Hews patient's pain agreement letter is complete and she will have it re-faxed to [place], provided CM fax#.      Kem Kays, RN, BSN  Ambulatory Patient Care Resources  Please route all responses to [facility] ,do not route to me personally. I am a Hospital doctor.

## 2023-05-13 ENCOUNTER — Telehealth: Payer: Self-pay | Admitting: SURGERY/NEUROLOGIC

## 2023-05-13 ENCOUNTER — Encounter: Payer: Self-pay | Admitting: SURGERY/NEUROLOGIC

## 2023-05-13 NOTE — Telephone Encounter (Signed)
05/13/23  Received Cardiology clearance dated 05/13/23 Daryel Gerald, FNP and EKG dated 05/13/23. Uploaded to media, procedure pass updated, Rns made aware via PP.   Zowie Lundahl, LVN

## 2023-05-13 NOTE — Telephone Encounter (Signed)
11/6 1140 - Faxed risk stratification letter to [phone] via RightFax and manual fax.    Allyne Gee, RN, BSN

## 2023-05-13 NOTE — Telephone Encounter (Signed)
Spoke with Haseena  verified name, DOB, MRN and/or address at initiation of call.    Provider:[facility]    Good contact number: [phone]     If you are not available, is it ok to leave a detailed message on your voicemail: yes    In your own words what is the chief concern you would like addressed today?: Cardiologist office would like risk stratification form sent to: NP, Manpreet Daryel Gerald  Will also fax EKG Reports, results and OV Notes  Fax# [phone]   Phone# [phone]       Phone calls are sent to the appropriate team to review and respond; urgent calls are handled first. If you have a non-urgent issue and require a call back, please allow up to 3 business days. If your call does not require a call back, please check your MyChart Telephone Encounter for completion.    Vista Lawman, [facility]

## 2023-05-14 ENCOUNTER — Encounter: Payer: Self-pay | Admitting: SURGERY/NEUROLOGIC

## 2023-05-15 NOTE — Telephone Encounter (Signed)
11/8 1446 - Called PCP office - [place] [phone]), spoke with Vincenza Hews, surgical clearance appointment scheduled 11/11. Risk stratification letter not on file. Fax # confirmed [phone].    11/8 1457 - Manually faxed risk stratification letter to PCP [phone].    Allyne Gee, RN, BSN

## 2023-05-20 ENCOUNTER — Telehealth: Payer: Self-pay | Admitting: SURGERY/NEUROLOGIC

## 2023-05-20 NOTE — Telephone Encounter (Signed)
11/13 1159 - Called PCP office - [place] [phone]), spoke with Maralyn Sago, will send message to medical team to fax over most recent OV notes. Last lab work on file from July. Fax CM # provided.    Allyne Gee, RN, BSN

## 2023-05-20 NOTE — Telephone Encounter (Signed)
Spoke with Samuel Cobb w/[facility] Cobb verified name, DOB, MRN and/or address at initiation of call.    Provider:Price    Good contact number: [phone] x [phone]    If you are not available, is it ok to leave a detailed message on your voicemail: yes    In your own words what is the chief concern you would like addressed today?: Samuel Cobb will re-fax over most recent OV notes. Last lab work on file from July & Risk stratification letter to [phone]. Samuel Cobb did ask if Samuel Cobb can give her a call back.        Phone calls are sent to the appropriate team to review and respond; urgent calls are handled first. If you have a non-urgent issue and require a call back, please allow up to 3 business days. If your call does not require a call back, please check your MyChart Telephone Encounter for completion.    Victorino Dike, [place]

## 2023-05-21 NOTE — Telephone Encounter (Signed)
11/14 2130 Jeanene Erb PCP office [phone] [phone]), spoke with Haseena. Advised her we received cardiology clearance but we still need PCP clearance. Haseena will give risk stratification letter to PCP to sign and will fax back to [place]. PCP will put in lab orders at the on-site lab ([facility]). Haseena will fax lab results when available.    Allyne Gee, RN, BSN

## 2023-05-22 NOTE — Telephone Encounter (Signed)
 FYI only - no action required / Please close encounter if no other recommendations     Patient's concerns addressed in another TE, no further action needed.    Kem Kays, RN, BSN  Ambulatory Patient Care Resources  Please route all responses to P SPCTR NURSING,do not route to me personally. I am a Hospital doctor.

## 2023-05-22 NOTE — Telephone Encounter (Signed)
11/15 0951 Samuel Cobb PCP office [phone]), spoke with Haseena. Received risk stratification, but PCP did not indicate if pt was cleared for surgery or not. Haseena will re-fax risk stratification.    Allyne Gee, RN, BSN

## 2023-05-27 NOTE — Telephone Encounter (Addendum)
Spoke with staff at [employer], [place], [place] is not available but she will let her know we received labs.  Labs completed 11/18 and added to "media" tab.    Kem Kays, RN, BSN  Ambulatory Patient Care Resources  Please route all responses to [facility] ,do not route to me personally. I am a Hospital doctor.

## 2023-05-28 ENCOUNTER — Encounter: Payer: Self-pay | Admitting: SURGERY/NEUROLOGIC

## 2023-06-12 ENCOUNTER — Telehealth: Payer: Self-pay | Admitting: SURGERY/NEUROLOGIC

## 2023-06-12 ENCOUNTER — Ambulatory Visit: Payer: MEDICAID | Admitting: Medical

## 2023-06-12 NOTE — Telephone Encounter (Signed)
TC to pt for Spine pre-op appointment today. Patient reports he is currently admitted at [facility] for "heart attack." He believes he will be discharged today, and is feeling better.     Advised patient this will likely mean surgery postponement until outpatient follow up with Cardiology. He is not sure if he will be discharged on blood thinners. He is still taking Buprenorphine. Informed patient I will request discharge records and then likely need to adjust surgery date for further work up. Patient verbalized understanding.     Urgent records request faxed to [facility].    Marita Kansas, RN  Neuro Spine Case Manager

## 2023-06-18 ENCOUNTER — Encounter: Payer: Self-pay | Admitting: SURGERY/NEUROLOGIC

## 2023-06-18 NOTE — Telephone Encounter (Signed)
FYI only - no action required / Please close encounter if no other recommendations     [facility] hospitalization 12/4-12/6 for MI. Stent placed and started on DAPT (ASA and Effient). Will need Cards follow up and clearance prior to surgery; date postponed.  Patient will call back with updates.    Kem Kays, RN, BSN  Clinical Nurse II  [facility]  [phone]

## 2023-06-18 NOTE — Telephone Encounter (Addendum)
FYI only - no action required / Please close encounter if no other recommendations     06/18/23@214PM  Spoke with patient, confirmed patient identifiers x3.  Advised per the team he will need to postpone surgery 12/20 due to recent heart attack.  He has a referral to Cardiology but weill need to schedule an appointment with them and will follow-up with our clinic at a later date.  He understands instructions.      Asencion Noble, [place] to Me 06/18/23 12:57 PM    Yes, we need to postpone pending cards clearance/permission to hold antiplatelet/anticoagulants for surgery.  Belenda Cruise    Me to Asencion Noble, [place]  Me 06/18/23 11:49 AM    Hi Belenda Cruise,    Based on patient's current status, ok to postpone him until he sees Cards?    Kem Kays, RN, BSN  Clinical Nurse II  [facility] [facility]  [phone]

## 2023-06-18 NOTE — Telephone Encounter (Signed)
[facility] hospitalization 12/4-12/6 for MI. Stent placed and started on DAPT (ASA and Effient). Will need Cards follow up and clearance prior to surgery; date to be postponed.         Marita Kansas, RN  Neuro Spine Case Manager

## 2023-06-18 NOTE — Telephone Encounter (Signed)
06/18/23  Called [facility] to follow up with discharge notes dated 06/12/23 spoke with clerk who states she will send a message for records to resend records. Fax number confirmed, pending records. RN advised.   Arie Powell, LVN

## 2023-06-25 ENCOUNTER — Telehealth: Payer: Self-pay | Admitting: SURGERY/NEUROLOGIC

## 2023-07-17 ENCOUNTER — Encounter: Payer: Self-pay | Admitting: SURGERY/NEUROLOGIC

## 2023-07-17 NOTE — Telephone Encounter (Signed)
 Contacted patient, verified identification x3    Incoming mychart message from patient:    Samuel Cobb  P Spctr Nursing11 hours ago (10:12 PM)     Hi, I was supposed to have fusion surgery in December but ended up having a heart attack a couple weeks out so the surgery is postponed until further notice. The pain and numbness has gotten exponentially worse. The whole left side of my body from my elbow up to my scalp keeps going numb and my arm and hand occasionally twitches. Ive tried talking with my PCP but she just keeps saying I have to talk to you guys. I don't know what to do anymore but it's driving me crazy. Is there anything we can do?        Pain Location: neck   Patient twitching or left arm comes and goes  Quality sharp shooting pain / stabbing pain at the base of neck off the center to the right    Scale 7/10 at rest   10/10 with touching    Medications and home care  Heat helps some   Bupenorphine 4 mg TID    Not on any muscle relaxer   Ice - hasn't tried recently   Not using lidocaine   Gabapentin tried and failed in past  Hx of lyrica but not currently taking     Recalls a cervical injection around 2020 without relief     Drops items constantly   Balance off at times and hard to catch himself   Patient is in cardiac rehab and is waiting to see cardiologist   Prasugrel 10 mg daily        DISPO: patient to continue to work with PCP for management of symptoms pending cardiac clearance for surgery with Dr. Marlow Sin. Encouraged use of conservative measures ice, topical patches and ointments.     Strict ER return precautions advised for severe uncontrolled pain, weakness, worsening numbness.     Forwarding to Kristin to advise any recommendations for PCP? Should patient be scheduled a VV to review conservative recommendations (injections?)?     Last office visit: 03/05/2023    Assessment   Samuel Cobb is a 40yr male with a past medical history of  Crohn's disease,  presenting for cervical and right cervical radicular pain, urinary urgency, gait ataxia and loss of coordination. Patient is diffusely hyperreflexic with positive hoffman's today and right upper extremity weakness.      Imaging notable for degenerative changes from C4-7 with significant central canal stenosis and cord compression most notably at C5-6 and C6-7, which has progressed since 2021 MRI slightly. There is possible cord signal change at C5-7 (though question if this is artifact).      MJOA score of 13, consistent with moderate cervical myelopathy.      Patient was seen and evaluated with Attending Joel Murphy MD who interpreted the imaging, exam and symptoms with patient.   Plan      Surgical recommendations: C5-7 ACDF    Eldonna Greenspan, BSN, RN-BC  [facility]  [phone]     Please send all EMR Responses to our work department pool:   [facility]

## 2023-07-31 ENCOUNTER — Encounter: Payer: MEDICAID | Admitting: Medical

## 2023-09-14 ENCOUNTER — Telehealth: Payer: Self-pay | Admitting: SURGERY/NEUROLOGIC

## 2023-09-14 DIAGNOSIS — D689 Coagulation defect, unspecified: Secondary | ICD-10-CM

## 2023-09-14 DIAGNOSIS — Z131 Encounter for screening for diabetes mellitus: Secondary | ICD-10-CM

## 2023-09-14 DIAGNOSIS — Z01818 Encounter for other preprocedural examination: Secondary | ICD-10-CM

## 2023-09-14 NOTE — Telephone Encounter (Signed)
 Spoke with Shanda Bumps  verified name, DOB, MRN and/or address at initiation of call.    Provider:Dr. Sabino Niemann contact number: [phone]     If you are not available, is it ok to leave a detailed message on your voicemail: yes    In your own words what is the chief concern you would like addressed today?: Cardiology Office (Dr. Tasia Catchings) states that Pt established care with Cardiologist and requesting we send a formal request for cardiac clearance     Fax# [phone]        Phone calls are sent to the appropriate team to review and respond; urgent calls are handled first. If you have a non-urgent issue and require a call back, please allow up to 3 business days. If your call does not require a call back, please check your MyChart Telephone Encounter for completion.    Vista Lawman, [facility]

## 2023-09-15 NOTE — Telephone Encounter (Signed)
 Risk Stratification sent to Cardiology fax number [phone]

## 2023-09-18 NOTE — Addendum Note (Signed)
 Addended byKem Kays on: 09/18/2023 04:58 PM     Modules accepted: Orders

## 2023-09-18 NOTE — Telephone Encounter (Signed)
 Spoke with patient, confirmed patient identifiers x2.  Rescheduled patient to 5/9 for C5-7 ACDF.  Per patient he has established with cards; he was in the ER for MI, had left heart cath and stent placed 12/4, started on DAPT (ASA and Effient).  Per patient he is still taking Buprenorphine, is now taking 4 mg 4 x daily prescribed by PCP.  He confirmed he sees PCP Olena Leatherwood.  He would like his labs done at [facility] lab: [address]  [place], [place] [address]    [phone] [facility]  [phone] FX    He has a follow-up appointment with Cardiology 4/21 (Dr. Tasia Catchings), risk stratification sent to Fax# [phone] .    Kem Kays, RN, BSN  Clinical Nurse II  [place] [facility]  [phone]

## 2023-09-21 DIAGNOSIS — Z01818 Encounter for other preprocedural examination: Secondary | ICD-10-CM

## 2023-09-21 NOTE — Addendum Note (Signed)
 Addended by: Kem Kays on: 09/21/2023 09:47 AM     Modules accepted: Orders

## 2023-09-22 NOTE — Telephone Encounter (Addendum)
 Surgery:  11/13/23 C5-7 ACDF      Post-op care plan:     Pre-surgical tasks:    []  PCP with pain agreement for buprenorphine 4 mg, 4 x daily        2. []  Cardiology 10/26/23 with DAPT recs (ASA and Effient)      3.  [x]  EKG - 10/26/23        4. [x]  Imaging -   MR C-Spine 12/18/22- Archived   C-Spine XR 4 views 06/10/23      5. [x]  Med Rec- Completed by Laurie Poplar, RN        6. [x]  Labs - Completed   Abnormals:   HgbA1C 6.0    UA:  Glucose 3+  PT 11.6

## 2023-09-22 NOTE — Telephone Encounter (Addendum)
 FYI only - no action required / Please close encounter if no other recommendations     Faxed lab orders to [facility] lab at fax #[phone].  Faxed Cardiology risk stratification with request for DAPT hold recommendations to fax #[phone].  Faxed risk stratification and pain agreement to PCP at fax # [phone]    Asencion Noble, [place]  You21 hours ago (12:21 PM)    He has an outside PCP so please send pain agreement asking them to manage medications. If they agree to manage medications then he would need to hold for 3 days before surgery, resume afterwards but they're responsible for everything after discharge.  -Belenda Cruise    You  You; Asencion Noble, PAYesterday (9:47 AM)    Hi,    R/S patient to 5/9; he was first postponed due to MI and stent placement in December.  Ok to send initial pain letter requesting "our team recommends that he be transitioned off of buprenorphine and onto immediate-release opoid Rx in preparation of surgery 1 week in advance"? Patient also on DAPT (ASA and Effient) will defer to Cards.    Toms, [place], [place]  You4 days ago    No, I don't think that's necessary. We need new clearances and everything but I think we are OK for scheduling him. He should be an IN PERSON pre-op to do a new exam.    You  You; Toms, Erin Fulling, PA4 days ago    Hey there,    Patient was sched but had to postpone due to NSTEMI, he hasn't been seen since 02/2023 does he need new appt before rescheduling?    Kem Kays, RN, BSN  Clinical Nurse II  [place] [facility]  [phone]

## 2023-09-23 ENCOUNTER — Other Ambulatory Visit: Payer: Self-pay | Admitting: SURGERY/NEUROLOGIC

## 2023-09-24 ENCOUNTER — Telehealth: Payer: Self-pay | Admitting: SURGERY/NEUROLOGIC

## 2023-09-24 LAB — URINALYSIS, COMPLETE W/REFLEX TO CULTURE (EXTERNAL LAB)
Bacteria_Ext: NONE SEEN /HPF
Bilirubin_Ext: NEGATIVE
Hyaline Cast_Ext: NONE SEEN /LPF
Ketones_Ext: NEGATIVE
Leukocyte Esterase_Ext: NEGATIVE
Nitrite_Ext: NEGATIVE
Occult Blood_Ext: NEGATIVE
Protein_Ext: NEGATIVE
Specific Gravity_Ext: 1.033 (ref 1.001–1.035)
Squamous Epithelial Cells_Ext: NONE SEEN /HPF (ref <=5)
WBC_Ext: NONE SEEN /HPF (ref <=5)
pH_Ext: 7.5 (ref 5.0–8.0)

## 2023-09-24 LAB — CBC (INCLUDES DIFF/PLT) (EXTERNAL LAB)
Basophils % Auto: 0.6 %
Basophils Abs Auto (cells/uL): 38 {cells}/uL (ref 0–200)
Eosinophils % Auto: 2.1 %
Eosinophils Abs Auto (cells/uL): 132 {cells}/uL (ref 15–500)
Hematocrit: 44.8 % (ref 38.5–50.0)
Hemoglobin: 15 g/dL (ref 13.2–17.1)
Lymphocytes % Auto: 32.7 %
Lymphocytes Abs Auto (cells/uL): 2060 {cells}/uL (ref 850–3900)
MCH: 29 pg (ref 27.0–33.0)
MCHC g/dL: 33.5 g/dL (ref 32.0–36.0)
MCV: 86.7 fL (ref 80.0–100.0)
MPV: 10.5 fL (ref 7.5–12.5)
Monocytes % Auto: 7.1 %
Monocytes Abs Auto (cells/uL): 447 {cells}/uL (ref 200–950)
Neutrophils % Auto: 57.5 %
Neutrophils Abs Auto (cells/uL): 3623 {cells}/uL (ref 1500–7800)
Platelet Count: 234 10*3/uL (ref 140–400)
RDW: 14.3 % (ref 11.0–15.0)
Red Blood Cell Count: 5.17 10*6/uL (ref 4.20–5.80)
White Blood Cell Count: 6.3 10*3/uL (ref 3.8–10.8)

## 2023-09-24 LAB — COMPREHENSIVE METABOLIC PANEL (EXTERNAL LAB)
ALT_Ext: 25 U/L (ref 9–46)
AST_Ext: 24 U/L (ref 10–40)
Albumin/Globulin Ratio_Ext: 2.1 (calc) (ref 1.0–2.5)
Albumin_Ext: 4.8 g/dL (ref 3.6–5.1)
Alkaline Phosphatase_Ext: 58 U/L (ref 36–130)
Bilirubin, Total_Ext: 0.5 mg/dL (ref 0.2–1.2)
Calcium_Ext: 9.3 mg/dL (ref 8.6–10.3)
Carbon Dioxide_Ext: 24 mmol/L (ref 20–32)
Chloride_Ext: 106 mmol/L (ref 98–110)
Creatinine_Ext: 0.8 mg/dL (ref 0.60–1.26)
Globulin_Ext: 2.3 g/dL (ref 1.9–3.7)
Glucose_Ext: 86 mg/dL (ref 65–99)
Potassium_Ext: 4 mmol/L (ref 3.5–5.3)
Protein, Total_Ext: 7.1 g/dL (ref 6.1–8.1)
Sodium_Ext: 138 mmol/L (ref 135–146)
Urea Nitrogen (BUN)_Ext: 13 mg/dL (ref 7–25)
eGFRcr SerPlBld CKD-EPI 2021_Ext: 118 mL/min/{1.73_m2} (ref >=60)

## 2023-09-24 LAB — HEMOGLOBIN A1C (EXTERNAL LAB): Hemoglobin A1C_Ext: 6 %{Hb} — ABNORMAL HIGH (ref <5.7)

## 2023-09-24 LAB — PROTHROMBIN TIME-INR (EXTERNAL LAB)
INR_Ext: 1.1
Prothrombin Time_Ext: 11.6 s — ABNORMAL HIGH (ref 9.0–11.5)

## 2023-09-24 LAB — PARTIAL THROMBOPLASTIN TIME, ACTIVATED (EXTERNAL LAB): Partial Thromboplastin Time, Activated_Ext: 32 s (ref 23–32)

## 2023-09-24 NOTE — Telephone Encounter (Signed)
 Spoke with Efraim Kaufmann NP [place] verified name, DOB, MRN and/or address at initiation of call.    Provider:Price    Good contact number: [phone]    If you are not available, is it ok to leave a detailed message on your voicemail: yes    In your own words what is the chief concern you would like addressed today?: Melissa has questions on Pre op clearance if you could please return her call.       Phone calls are sent to the appropriate team to review and respond; urgent calls are handled first. If you have a non-urgent issue and require a call back, please allow up to 3 business days. If your call does not require a call back, please check your MyChart Telephone Encounter for completion.    Victorino Dike, [place]

## 2023-09-25 NOTE — Telephone Encounter (Signed)
 FYI only - no action required / Please close encounter if no other recommendations     Spoke to Olena Leatherwood, NP.  Per Efraim Kaufmann, she is willing to manage patient's pain medications however will not have patient hold buprenorphine 3 days prior to surgery.  Spoke with Eldridge Scot, PA, per Belenda Cruise she is agreeable to not have patient hold buprenorphine for 3 days prior to surgery.  Becky Sax, NP a revised pain agreement letter manually via fax [phone]).  No further action needed.    Kem Kays, RN, BSN  Clinical Nurse II  [place] [facility]  [phone]

## 2023-10-21 ENCOUNTER — Encounter: Payer: Self-pay | Admitting: SURGERY/NEUROLOGIC

## 2023-10-30 NOTE — Telephone Encounter (Signed)
 11AM Spoke with Trevor Fudge at Cards office, confirmed she received risk stratification and will send note to Dr. Alita Irwin.    12AM Spoke with PCP office, they have not received risk stratification yet, was advised to check back Monday.    Jeneen Mire, RN, BSN  Clinical Nurse II  [place] [facility]  [phone]

## 2023-10-30 NOTE — Telephone Encounter (Signed)
 PCP's office called stating that they will obtain clearance from Cardiologist - once Cardiologist clears Pt- PCP will submit risk statisfication

## 2023-11-03 ENCOUNTER — Encounter: Payer: Self-pay | Admitting: SURGERY/NEUROLOGIC

## 2023-11-03 NOTE — Pre-Op/Pre-Procedure Screening (Signed)
 Pre-Anesthesia Clinic - Med Rec & Med Instructions Attempt for DOS 11/13/2023    Procedure(s) with comments:  DISCECTOMY, SPINE, CERVICAL, 2 LEVELS, ANTERIOR APPROACH, WITH FUSION (N/A) - C5-7  MONITORING, NERVE (N/A)  Surgeon(s):  Damien Dukes, MD        Called and spoke with patient on 11/04/23  ID x 3.    Infectious Disease Screen (updated 11/19/22):    1.   Over the past 2 weeks, have you had new cold/influenza-like symptoms not related to a chronic illness such as: Fever >100 F(37.8 C), chills, muscle pain, headache, abdominal pain, diarrhea, nausea, vomiting, loss of taste/smell, cough, shortness of breath, runny nose, nasal/chest congestion, rash, or sore throat? no      2.   In the past 3 weeks have you been exposed to anyone with measles or cold symptoms AND rash? no        Allergies reviewed:  Allergies[1]      Social History reviewed:  Tobacco Use History[2]  Social History     Substance and Sexual Activity   Alcohol Use Not Currently     Social History     Substance and Sexual Activity   Drug Use Yes    Types: Marijuana    Comment: smokes MJ     *If Patient is a current smoker (Tobacco or Medical Marijuana), they were advised not to smoke on DOS.  *If Patient uses Recreational Marijuana, they were advised not to use marijuana products 72 hours prior to DOS.  *If Patient drinks alcohol, they were advised not to drink alcohol on DOS.      Patient Medication Instructions:    Stop NSAIDs 7 days prior to surgery (aspirin, ibuprofen, motrin, naproxen, aleve, mobic, meloxicam, diclofenac, etc) or as directed by your surgeon.    Acetaminophen or Tylenol is OK to keep taking.    Stop Vitamins and Supplements for 7 days prior to surgery (if you are taking prescribed vitamins and supplements consult the Prescribing Physician).     Blood thinner medication as directed by your Surgeon and Prescribing Physician (if you are unsure, call your surgeon).       Procedure pass message sent to Dr Shirl Dose PA &  B.Mckay RN in spine clinic RE: pre op [facility] instruction as well as pt reported "candida fungal throat infection":          Current Medications   Medication Sig Instructions      Aspirin 81 mg Chewable Tablet Take 1 tablet by mouth every day. Defer to prescriber/surgeon instructions      Atorvastatin (LIPITOR) 80 mg tablet Take 1 tablet by mouth every day. Continue as prescribed    Buprenorphine (SUBUTEX) 2 mg Sublingual Tablet Dissolve 2 tablets under the tongue 4 times daily. Continue as prescribed    Diazepam (VALIUM) 2 mg Tablet Take 2.5 tablets by mouth once daily if needed for anxiety. 5 mg daily prn Continue as prescribed    Fluconazole (DIFLUCAN) 150 mg Tablet Take 1 tablet by mouth every morning.   Continue as prescribed    JARDIANCE 10 mg Tablet Take 1 tablet by mouth every day. HOLD 3 days prior to surgery      LINZESS 145 mcg Capsule Take 1 capsule by mouth every morning. Continue as prescribed    LOPRESSOR 50 mg Tablet Take 1 tablet by mouth 2 times daily.   Continue as prescribed    Losartan (COZAAR) 25 mg Tablet Take 0.5 tablets by mouth every morning.  Do not take on day of surgery        Nitroglycerin (NITROSTAT) 0.4 mg Sublingual Tablet Dissolve 1 tablet under the tongue.   Continue as prescribed    Prasugrel (EFFIENT) 10 mg Tablet Take 1 tablet by mouth every morning. Defer to prescriber/surgeon instructions        Promethazine (PHENERGAN) 25 mg Tablet Take 1 tablet by mouth once daily if needed. Continue as prescribed      Patient verbalized understanding of instructions.      Anderson Banana, RN  [facility] (Pre Anesthesia Clinic)        [1]   Allergies  Allergen Reactions    Nuts [Peanut] Other-Reaction in Comments     Contraindicated d/t Chron's dz    Quinine Hydrobromide Nausea Only and Other-Reaction in Comments     Pt is unsure if this allergy was selected by accident in the past.    Reglan [Metoclopramide] Other-Reaction in Comments     Muscle spasms    Toradol [Ketorolac Tromethamine] Other-Reaction  in Comments     Muscle spasms    Tramadol Other-Reaction in Comments     Muscle spasms    Wheat Starch Other-Reaction in Comments     Contraindicated d/t Chron's dz    Zoloft [Sertraline Hcl] Palpitations   [2]   Social History  Tobacco Use   Smoking Status Former    Types: Cigars   Smokeless Tobacco Never   Tobacco Comments    Hx social smoking (cigars) - none since ~ 2015

## 2023-11-04 NOTE — Telephone Encounter (Signed)
 223PM Spoke with patient, has candida infection, has light cough and congestion, will be taking Diflucan for infection.     218PM Spoke with Cassandra at patient's Cardiology office [phone]; she will have an MA return call.    Jeneen Mire, RN, BSN  Clinical Nurse II  [facility]  [phone]

## 2023-11-05 NOTE — Telephone Encounter (Signed)
 Spoke with Brit at Cards, she will send a message to MA for medication holds/Cardiology clearance.    Jeneen Mire, RN, BSN  Clinical Nurse II  [place] [facility]  [phone]

## 2023-11-06 ENCOUNTER — Encounter: Payer: Self-pay | Admitting: PHYSICIAN ASSISTANT

## 2023-11-06 ENCOUNTER — Telehealth: Payer: Self-pay | Admitting: SURGERY/NEUROLOGIC

## 2023-11-06 ENCOUNTER — Ambulatory Visit: Payer: MEDICAID | Attending: PHYSICIAN ASSISTANT | Admitting: PHYSICIAN ASSISTANT

## 2023-11-06 ENCOUNTER — Ambulatory Visit: Payer: MEDICAID | Admitting: PHYSICIAN ASSISTANT

## 2023-11-06 VITALS — BP 100/59 | HR 67 | Temp 97.5°F | Resp 12 | Ht 67.0 in | Wt 172.0 lb

## 2023-11-06 DIAGNOSIS — M5412 Radiculopathy, cervical region: Secondary | ICD-10-CM | POA: Insufficient documentation

## 2023-11-06 DIAGNOSIS — G959 Disease of spinal cord, unspecified: Secondary | ICD-10-CM | POA: Insufficient documentation

## 2023-11-06 DIAGNOSIS — Z419 Encounter for procedure for purposes other than remedying health state, unspecified: Secondary | ICD-10-CM | POA: Insufficient documentation

## 2023-11-06 DIAGNOSIS — M4802 Spinal stenosis, cervical region: Secondary | ICD-10-CM

## 2023-11-06 DIAGNOSIS — Z01818 Encounter for other preprocedural examination: Secondary | ICD-10-CM | POA: Insufficient documentation

## 2023-11-06 MED ORDER — CHLORHEXIDINE GLUCONATE 4 % TOPICAL LIQUID
TOPICAL | 3 refills | Status: DC
Start: 2023-11-06 — End: 2023-11-14

## 2023-11-06 NOTE — Telephone Encounter (Signed)
 Spoke with Samuel Cobb from Cards verified name, DOB, MRN and/or address at initiation of call.    Provider:[facility]    Good contact number: [phone]     If you are not available, is it ok to leave a detailed message on your voicemail: yes    In your own words what is the chief concern you would like addressed today?: Doctor from [facility] does not recommend to hold off on Asprin, this information was faxed in with office visits notes.    Any questions, or if you haven't received notes. Please reach out to [facility]      Phone calls are sent to the appropriate team to review and respond; urgent calls are handled first. If you have a non-urgent issue and require a call back, please allow up to 3 business days. If your call does not require a call back, please check your MyChart Telephone Encounter for completion.    Fortunato Ill, [facility]

## 2023-11-06 NOTE — Telephone Encounter (Signed)
 252PM TC to patient, advised per team he can proceed with surgery 5/9; he will need to hold Effient for 5 days before surgery and per his Cardiologist should not hold ASA 81 mg daily.  He confirmed he is only taking 81 mg and understands instructions.     225 PM Called PCP office, per staff they still need the office visit notes from Cards before they can clear patient but should receive them by EOD.  Advised this RN will call back Monday.    Jeneen Mire, RN, BSN  Clinical Nurse II  [place]  [phone]

## 2023-11-06 NOTE — Nursing Note (Signed)
 Identified patient using name and date of birth.       Vitals were taken.   Screened for pain.      Inetta Fermo, MA II

## 2023-11-06 NOTE — Progress Notes (Signed)
 NEUROLOGICAL SPINE- SURGERY   [address], [address]  [place], [place]  [address]      Visit date:  11/06/2023    CC: PRE-OP EDUCATION VISIT    Patient: Samuel Cobb  MRN: [id number] D.O.B: 05-03-1986     Diagnosis:   Preoperative examination  (primary encounter diagnosis)  Cervical radicular pain  Cervical myelopathy (HCC)  Cervical stenosis of spinal canal  Surgery, elective    Proposed Procedure:      Date: 11/13/2023   Procedure: C5-7 ACDF  Surgeon: Joel Murphy, MD      Pre-op clearance H & P done by:   PCP:     Doylene Genet, MD  on: PENDING    Cardiology 10/26/23 with DAPT recs (ASA and Effient)         Per cardiology, does not recommend hold ASA for any length of time.       HPI    Mr. Samuel Cobb is a 28yr male who  has a past medical history of Anxiety and Diverticulitis.  presenting for spine surgery pre-operative education visit.    Current symptoms:   Patient feels symptoms are slightly worsened since last visit. Reporting continued/severe pain in the neck with numbness/tingling down both arms, states when he lays down the arms go numb. He has also started getting numbness into the left lower extremity which is new.      02/2023 MJOA: 4 + 5 + 2 + 2 = 13    11/06/2023 MJOA: 3-4 + 4 + 2 + 2 = 11-12  Moderate-severe cervical myelopathy    Pain intensity is currently 9/10 on pain scale.   Loss of balance: He does endorse loss of balance intermittently, but able to go up/down stairs without rail.   Loss of dexterity: He continues to have hand weakness and difficulty gripping/holding objects. He does report loss of dexterity and states he drops things. He is able to do buttons with difficulty.   Bowel or Bladder dysfunction: He does report urinary urgency but denies any frank loss. The urinary urgency has been ongoing about 2 years and is stable. Otherwise Denies any bowel, bladder, or sexual dysfunction. Denies any saddle anesthesia.     Assistive devices: Denies    Difficulty swallowing:  Denies    Taking blood thinners, anticoagulant / NSAID's medication? Yes ASA 81mg  + Prasugrel, Patient instructed to hold ASA 81mg  7 days  prior to surgery or as cleared by managing provider. Discontinue all other NSAIDs at least 7-10 days prior. Discussed increased risk of bleeding if not discontinued.     Taking Vitamin/Supplements? Denies, Patient instructed to hold vitamins/supplements for 10 days prior to surgery unless otherwise instructed by [facility]    Taking Forteo/Tymlos or other specialty medications? Denies    CPAP: Denies    Recent infections: thrush, ongoing fluconazole 5 day course started today and ending 11/11/2023. Pt states he is unsure why this happens but gets occasionally. Denies inhaler or steroid use.    Previous Surger(ies): Well tolerated   No personal and family history of reaction to anesthesia.     Tobacco/Nicotine: Denies     PMH:   Past Medical History[1]     Past Surgical History[2]    Allergies:     Nuts [Peanut]    Other-Reaction in Comments    Comment:Contraindicated d/t Chron's dz  Quinine Hydrobromide    Nausea Only and Other-Reaction in  Comments    Comment:Pt is unsure if this allergy was selected by             accident in the past.  Reglan [Metoclopramide]    Other-Reaction in Comments    Comment:Muscle spasms  Toradol [Ketorolac Tromethamine]    Other-Reaction in Comments    Comment:Muscle spasms  Tramadol    Other-Reaction in Comments    Comment:Muscle spasms  Wheat Starch    Other-Reaction in Comments    Comment:Contraindicated d/t Chron's dz  Zoloft [Sertraline Hcl]    Palpitations   Denies allergy to iodine or contrast dye.    Current Medications:  He has a current medication list which includes the following prescription(s): aspirin, atorvastatin, buprenorphine, chlorhexidine, desipramine , diazepam, fluconazole, jardiance, linzess, lopressor, losartan, nitroglycerin, prasugrel, pregabalin , and promethazine.    REVIEW OF SYSTEMS:    ROS: RECENT  MI    Physical Exam:    General: Appearance: in no apparent distress, well developed and well nourished, and non-toxic.   BP 100/59 (SITE: left arm, Orthostatic Position: sitting, Cuff Size: regular)   Pulse 67   Temp 36.4 C (97.5 F) (Temporal)   Resp 12   Ht 1.702 m (5\' 7" )   Wt 78 kg (171 lb 15.3 oz)   BMI 26.93 kg/m      Cervical  Palpation: non-TTP  ROM: limited flexion      Strength    Deltoid  C5 Biceps   C5 Triceps   C7 Wrist Flexion  C7 Wrist Extension  C6 Hand Intrinsics  C8, T1   Right  5/5 5/5 5/5 5-/5 5-/5 4+/5   Left  5/5 5/5 5/5 5/5 5/5 5/5        Hip Flexion  L2  Knee Extension  L3, L4  Knee Flexion  Dorsi  flexion   L5 Great Toe Extension  L5  Plantar Flexion  S1    Right  5/5 5/5 5/5 5/5 5/5 5/5   Left  5/5 5/5 5/5 5/5 5/5 5/5      Reflexes    Biceps  C5 Triceps  C7 Brachio  Radialis  C6 Hoffman's Trmner   Right  3+ 2+ 3+ pos pos   Left  3+ 2+ 3+ pos pos        Patella   L4 Achilles   S1   Right  3+ 2+   Left  3+ 2+      Straight Raise Leg Test: negative   Sensory: Sensation diminished along the posterior aspect of the right upper extremity into the 4-5th digits  Gait: Heel - toe walking is done without difficulty. Tandem gait done without difficulty.        Imaging:   MR C-Spine 12/18/22- Archived   C-Spine XR 4 views 06/10/23    Pre-op Studies:    CBC,BMP, A1C, INR/PTT, UA, EKG, were done  and are available in EMR.        Abnormals:   HgbA1C 6.0     UA:  Glucose 3+  PT 11.6    Red Blood Cell Count   Date Value Ref Range Status   09/23/2023 5.17 4.20 - 5.80 Million/uL Final   11/07/2019 4.85 4.50 - 5.90 M/MM3 Final     White Blood Cell Count   Date Value Ref Range Status   09/23/2023 6.3 3.8 - 10.8 Thousand/uL Final   11/07/2019 9.6 4.5 - 11.0 K/MM3 Final     Hematocrit   Date Value Ref Range Status   09/23/2023  44.8 38.5 - 50.0 % Final   11/07/2019 42.9 40.0 - 52.0 % Final     Hemoglobin   Date Value Ref Range Status   09/23/2023 15.0 13.2 - 17.1 g/dL Final   16/04/9603 54.0 14.0 - 18.0  g/dL Final     Hemoglobin [id number]   Date Value Ref Range Status   09/23/2023 6.0 (H) <5.7 % of total Hgb Final     Comment:     For someone without known diabetes, a hemoglobin   A1c value between 5.7% and 6.4% is consistent with  prediabetes and should be confirmed with a   follow-up test.     For someone with known diabetes, a value <7%  indicates that their diabetes is well controlled. A1c  targets should be individualized based on duration of  diabetes, age, comorbid conditions, and other  considerations.     This assay result is consistent with an increased risk  of diabetes.     Currently, no consensus exists regarding use of  hemoglobin A1c for diagnosis of diabetes for children.          INRExt   Date Value Ref Range Status   09/23/2023 1.1  Final     Comment:     Reference Range                     0.9-1.1  Moderate-intensity Warfarin Therapy 2.0-3.0  Higher-intensity Warfarin Therapy   3.0-4.0           Prothrombin TimeExt   Date Value Ref Range Status   09/23/2023 11.6 (H) 9.0 - 11.5 sec Final     Comment:     For additional information, please refer to  http://education.questdiagnostics.com/faq/FAQ104  (This link is being provided for informational/  educational purposes only.)         Impression and Plan:   Merrill Seldon is a 38yr old male pending C5-7 ACDF for cervical myelopathy.    I discussed the procedure in details with Bambi Lever.     I emphasized prevention of symptom progression as a key benefit of surgery, improvement of symptom is a desire but less predictable benefit of surgery. We also discussed the possible risks during and after surgery that include but are not limited to; Bleeding, infection, blood clots, nerve damage, paralysis, dysphonia, dysphagia, C5 palsy, Cerebral spinal fluid leak, hardware failure, the need for more surgery, no resolution of pain, bowel or bladder dysfunction, sexual dysfunction, anesthesia related adverse effects, such as heart attack,  stroke, and even death.   Headlee  voiced full understanding of these risks and wants to proceed with surgery.     - Consent has NOT been obtained. - drafted  - Pain contract: 0 weeks - PCP does not want patient tapered/holding pre-operatively and agrees to  maintain post-operatively after discharge, recommend inpatient pt be restarted on buprenorphine at same dosing when able. Ordered pain consult as well.   PCP with pain agreement for buprenorphine 4 mg, 4 x daily       - Orthotics: To be fitted at the hospital if needed  - Bone growth stimulator: N/A   - No NSAIDs x12 months post-op unless otherwise advised except ASA  - No Bending, Lifting, or Twisting x 6 weeks post-op, this will be re-assessed at the first post-op follow-up.    Surgery prep reminders:   - NPO at midnight night before surgery unless otherwise advised by surgical facility  - Chlorhexidine body wash kit: provided to  Jamelle Mcalpine with instructions, see RN's note.     Perioperative recommendations: Postoperatively, early mobilization, DVT prophylaxis (if indicated), incentive spirometry, bowel care, delirium/aspiration/decubitus ulcer precautions and resumption of home medications when appropriate.     NOTE: Spoke with Dr. Joel Murphy who is OK to proceed due to progressive cervical myelopathy despite patient being unable to hold ASA 81mg . Advised patient to hold Prasugrel 5 days prior to surgery as OKed by cards, continue ASA 81mg . Patient understands increased risk due to inability to hold.     OUTSTANDING PRE-OP ITEMS:  - PCP clearance  RN aware of outstanding items, must be obtained prior to surgery.     FOLLOW UP For clinic use:    Return for 6 week Post-Op visit.   Hospital discharge appointment will be at 6 weeks, or sooner if needed.    We discussed the natural history of the disease process and all questions were answered.We discussed possible treatment measures including conservative and surgical treatment options. We spent at least 74 minutes with the  patient, 50% of the time was spent on pre-op instructions, intra-op, postop care.    Deairra Halleck Lynn Ashlyn Cabler, PA-C  Neurological Spine-Surgery         [1]   Past Medical History:  Diagnosis Date    Anxiety     Diverticulitis    [2]   Past Surgical History:  Procedure Laterality Date    EXPLORATORY LAPAROTOMY  2015    PR CHOLECYSTECTOMY  2011    TONSILLECTOMY  1991

## 2023-11-09 NOTE — Telephone Encounter (Signed)
 308PM Received call from Alyssa at PCP office, she inquired if we have received signed risk stratification from Cards office, advised we have not.  She will reach back out to Cards office for clearance.    231PM Spoke with patient, confirmed patient identifiers x2.  Advised on pre-op education and tasks; patient understands instructions including ASA and Effient.  Sent mychart message to patient with follow-up/surgery packet.    1142AM Spoke with Anya at Cards office; confirmed Dr. Alita Irwin message to continue to take ASA.  She also inquired if PCP needs signed off risk stratification, advised they may or may not need it for clearance but I am not sure.  Advised they may need it to be signed, per Anya she will follow-up with Provider for signature and fax copy to us .    Jeneen Mire, RN, BSN  Clinical Nurse II  [facility]  [phone]

## 2023-11-09 NOTE — Telephone Encounter (Signed)
 FYI only - no action required / Please close encounter if no other recommendations     Confirmed with Anya at cards office, patient is not to hold ASA before surgery per Dr. Alita Irwin.  No further action needed.    Jeneen Mire, RN, BSN  Clinical Nurse II  [place]  [phone]

## 2023-11-10 NOTE — Telephone Encounter (Signed)
 Spoke with Ione Manly at PCP office regarding risk stratification; the Provider is in today and should be completing.     Jeneen Mire, RN, BSN  Clinical Nurse II  [facility]  [phone]

## 2023-11-11 NOTE — Pre-Op/Pre-Procedure Screening (Signed)
 Preoperative Phone Call Documentation -  DOS 11/13/2023    Procedure(s) with comments:  DISCECTOMY, SPINE, CERVICAL, 2 LEVELS, ANTERIOR APPROACH, WITH FUSION (N/A) - C5-7  MONITORING, NERVE (N/A)  Surgeon(s):  Price, Janese Medicine, MD      Patient confirmed receipt of pre op instructions.      Infectious Disease Screen (updated 11/19/22):    1.   Over the past 2 weeks, have you had new cold/influenza-like symptoms not related to a chronic illness such as: Fever >100 F(37.8 C), chills, muscle pain, headache, abdominal pain, diarrhea, nausea, vomiting, loss of taste/smell, cough, shortness of breath, runny nose, nasal/chest congestion, rash, or sore throat? no  (If yes to cold/flu symptoms, follow URI/COVID pathway; If yes to cold symptoms AND rash, message P PAC Anesthesiologist pool.:[id number])    2.   In the past 3 weeks have you been exposed to anyone with measles or cold symptoms AND rash? no  (If yes, message P PAC Anesthesiologist [phone])    Location for Check-In on Day of Surgery    [facility] located at [address] [place], [place] [address]. Please check in at the following location: Admissions Office ([facility] Main Lobby - Room [phone]) - then proceed to the 3rd floor [facility] Waiting Room (Room [phone])       Verification of planned surgery times  Surgery Date/Time Verified: Yes (1547)  Arrival Time Verified: 1345  Surgery Check-In Location Verified: [facility] OR    Fasting Instructions:  Time of Last Clear Liquids: 1345  Date of Last Clear Liquids: 11/13/23  Time of Last Solids : 0000  Date of Last Solids: 11/13/23     Post surgical discharge information   Person to pick up pt (if different than support person): parents    NOTE: If you have received special diet instructions from your surgeon (for example, bowel prep), please follow those instructions.    Fasting Guidelines for Adults      NO solid food including broth or dairy products after the cut off time for solid food (See above  documentation).  If you require thickened liquids to aid with swallowing, stop these at the same time as solid food.  Tube feeds (gastric or jejunal): Stop feeds at the cut off time for solid food.  On the morning of surgery:  Clear liquids may be continued until 2 hours prior to surgery. See above documentation for cut off time for clear liquids.    Examples of clear liquids:  Water and flavored water  Yellow/[place]/green sports drinks  Ensure Clear (only the version labeled Clear is allowed)  Filtered Apple or White Grape juice (NO PULP OR CHUNKS). Please do not consume red juices.  Black Coffee or Black Tea without any cream additives (no milk, plant milk, or creamer).  Thickened liquids (to aid with swallowing) are considered FULL liquids, not clear.  DO NOT DRINK alcohol containing beverages.        Instructions for Day of Surgery:    Do not smoke or drink alcohol.  Bring a Building services engineer ID and medical insurance card.  [employer] is [employer]. Please bring a credit card for discharge medications if your provider prescribes medications.  Bring your CPAP machine, if applicable. If you are bringing any medical devices to the hospital that require charging, please remember to bring your charger(s) with you.  Take a bath or shower on the morning of surgery. Do NOT apply any personal products to your skin, including cosmetics, deodorant,  creams, lotions or any scented products after bathing.  Wear comfortable clothing that's easy to take on and off. Leave all jewelry at home.  Do not bring any valuables. Do not bring any medications, unless instructed otherwise. We ask that your family/friends hold on to your belongings while you are in surgery. The surgery waiting room is equipped with lockers for patient belongings, and your family/friends can reach out to the waiting room staff if they would like to utilize these lockers.  If you are discharged the same day of your surgery, you must arrange for a responsible adult  to transport you home and stay with you for 24 hours. Your surgery will be cancelled if you are unable to accommodate this.       General information    Parking is located at [employer] [address], which next to the hospital at the corner of [address] and [address]. We will validate one parking ticket on the day of your surgery for one free exit from the parking garage (no in and outs).  Only those patients who are experiencing any cold, flu or COVID-like symptoms will need to complete a home COVID test and report the results back to us  at [phone].   You may have 2 consistent visitors accompany you to the waiting room, all visitors must be over the age of 35. Visitors will not be allowed if they have any cold/flu/Covid symptoms. Regular hospital visiting hours are between 9 a.m. and 9 p.m.; however visiting in the perioperative area may be limited.      Please call [phone] if you develop any new upper respiratory symptoms and/or rash, if there are changes to the medications you take, or if you have any last minute questions.        Anderson Banana, RN  [facility]

## 2023-11-11 NOTE — Telephone Encounter (Addendum)
 Received incoming call from MA at PCP office; she still has not received risk stratification from Cards indicating a "yes or no" clearance.  She will try to see if this can be done and update me on status.    Jeneen Mire, RN, BSN  Clinical Nurse II  [facility]  [phone]

## 2023-11-11 NOTE — Telephone Encounter (Signed)
 Spoke with PCP office, they will have MA working on risk stratification call back with update.    Jeneen Mire, RN, BSN  Clinical Nurse II  [facility] [facility]  [phone]

## 2023-11-12 ENCOUNTER — Telehealth: Payer: Self-pay | Admitting: SURGERY/NEUROLOGIC

## 2023-11-12 NOTE — H&P (Signed)
 BRIEF NEUROSURGERY H&P UPDATE     Samuel Cobb is a 5yr male with PMH of anxiety, CAD/MI (s/p cardiac stent on ASA and prasugrel) diverticulitis, and cervical myelopathy who presents for C5-7 ACDF.  Most recent H&P from 11/06/23 reviewed.     Patient seen and examined in the pre-operative area.  No changes to the medications or medical history from the last office visit note.  Procedure, site, and indication reviewed and confirmed with the patient, all questions were answered.    PHYSICAL EXAM:  General Appearance: healthy, alert, no distress, pleasant affect, cooperative.     Neuro Exam:   GCS: Eyes: 4 Verbal: 5 Motor: 6  Cranial Nerves: FS     Motor:    Deltoid Biceps Triceps Wrist Flexion Wrist Extension Hand Intrinsics   Right 4+ 4+ 4+ 5 5 4+   Left 5 5 5 5 5 5       Hip Flexion Knee Extension Knee Flexion Dorsiflexion Great Toe Extension Plantar Flexion   Right 5 5 5 5 5 5    Left 5 5 5 5 5 5    Sensory:  Sensation diminished along the posterior aspect of the right upper extremity into the 4-5th digits, otherwise sensation intact and symmetric to LT throughout       PLAN:   - Will proceed with operation as planned    Gwyndolyn Lerner, MD  PGY-2 Neurological Surgery  Service Consult Pager: [phone]  Urgent Vascular Team: [phone]  Spine Team: [phone]  Pediatric Team: [phone]  Cranial Team: [phone]

## 2023-11-12 NOTE — Telephone Encounter (Signed)
 Spoke with Dr. Marlow Sin regarding recommendations from PCP and Cardiology; he would still like to proceed with surgery as it is urgent.    6AM  Spoke with Francis Irons, MA, covering Provider will not check off "yes or no", they sent a cover letter to PCP regarding risk level.  She will follow-up with PCP office to ensure they received fax and call back with update.

## 2023-11-13 ENCOUNTER — Ambulatory Visit: Payer: MEDICAID | Admitting: PHYSICIAN ASSISTANT

## 2023-11-13 ENCOUNTER — Encounter
Admission: RE | Disposition: A | Discharge status: Home / Self Care | Payer: Self-pay | Source: Ambulatory Visit | Attending: SURGERY/NEUROLOGIC

## 2023-11-13 ENCOUNTER — Inpatient Hospital Stay: Payer: MEDICAID

## 2023-11-13 ENCOUNTER — Encounter: Payer: Self-pay | Admitting: PHYSICIAN ASSISTANT

## 2023-11-13 ENCOUNTER — Inpatient Hospital Stay
Admission: RE | Admit: 2023-11-13 | Discharge: 2023-11-14 | DRG: 472 | Disposition: A | Discharge status: Home / Self Care | Payer: MEDICAID | Source: Ambulatory Visit | Attending: SURGERY/NEUROLOGIC | Admitting: SURGERY/NEUROLOGIC

## 2023-11-13 DIAGNOSIS — M4802 Spinal stenosis, cervical region: Secondary | ICD-10-CM

## 2023-11-13 DIAGNOSIS — M5412 Radiculopathy, cervical region: Principal | ICD-10-CM

## 2023-11-13 DIAGNOSIS — G8929 Other chronic pain: Secondary | ICD-10-CM | POA: Diagnosis present

## 2023-11-13 DIAGNOSIS — M4712 Other spondylosis with myelopathy, cervical region: Secondary | ICD-10-CM

## 2023-11-13 DIAGNOSIS — M50022 Cervical disc disorder at C5-C6 level with myelopathy: Principal | ICD-10-CM | POA: Diagnosis present

## 2023-11-13 DIAGNOSIS — Z955 Presence of coronary angioplasty implant and graft: Secondary | ICD-10-CM

## 2023-11-13 DIAGNOSIS — G959 Disease of spinal cord, unspecified: Secondary | ICD-10-CM

## 2023-11-13 DIAGNOSIS — I2510 Atherosclerotic heart disease of native coronary artery without angina pectoris: Secondary | ICD-10-CM | POA: Diagnosis present

## 2023-11-13 DIAGNOSIS — F1120 Opioid dependence, uncomplicated: Secondary | ICD-10-CM | POA: Diagnosis present

## 2023-11-13 DIAGNOSIS — Z7982 Long term (current) use of aspirin: Secondary | ICD-10-CM

## 2023-11-13 DIAGNOSIS — R7303 Prediabetes: Secondary | ICD-10-CM | POA: Diagnosis present

## 2023-11-13 LAB — TYPE AND SCREEN
Ab Scrn-Gel: NEGATIVE
Patient Blood Type: A POS

## 2023-11-13 SURGERY — DISCECTOMY, SPINE, CERVICAL, 2 LEVELS, ANTERIOR APPROACH, WITH FUSION
Anesthesia: General | Site: Spine Cervical | Wound class: Clean

## 2023-11-13 MED ORDER — INTRAOP DEXAMETHASONE 4 MG/ML INJ 1 ML VIAL
Status: DC | PRN
Start: 2023-11-13 — End: 2023-11-13
  Administered 2023-11-13: 10 mg via INTRAVENOUS

## 2023-11-13 MED ORDER — INTRAOP DEXMEDETOMIDINE 100 MCG/ML IV BOLUS DOSE
Status: DC | PRN
Start: 2023-11-13 — End: 2023-11-13
  Administered 2023-11-13: 20 ug via INTRAVENOUS
  Administered 2023-11-13: 30 ug via INTRAVENOUS
  Administered 2023-11-13 (×2): 20 ug via INTRAVENOUS

## 2023-11-13 MED ORDER — ONDANSETRON HCL (PF) 4 MG/2 ML INJECTION SOLUTION
INTRAMUSCULAR | Status: DC | PRN
Start: 2023-11-13 — End: 2023-11-13
  Administered 2023-11-13: 4 mg via INTRAVENOUS

## 2023-11-13 MED ORDER — OXYCODONE 5 MG TABLET
5.0000 mg | ORAL_TABLET | Freq: Once | ORAL | Status: AC
Start: 2023-11-13 — End: 2023-11-13
  Administered 2023-11-13: 5 mg via ORAL
  Filled 2023-11-13: qty 1

## 2023-11-13 MED ORDER — LACTATED RINGERS IV INFUSION
INTRAVENOUS | Status: DC
Start: 2023-11-13 — End: 2023-11-14

## 2023-11-13 MED ORDER — LOSARTAN 25 MG TABLET
12.5000 mg | ORAL_TABLET | Freq: Every day | ORAL | Status: DC
Start: 2023-11-14 — End: 2023-11-14
  Administered 2023-11-14: 12.5 mg via ORAL
  Filled 2023-11-13: qty 1

## 2023-11-13 MED ORDER — ASPIRIN 81 MG CHEWABLE TABLET
81.0000 mg | CHEWABLE_TABLET | Freq: Every day | ORAL | Status: DC
Start: 2023-11-14 — End: 2023-11-14
  Administered 2023-11-14: 81 mg via ORAL
  Filled 2023-11-13: qty 1

## 2023-11-13 MED ORDER — BISACODYL 10 MG RECTAL SUPPOSITORY
10.0000 mg | RECTAL | Status: DC | PRN
Start: 2023-11-13 — End: 2023-11-14

## 2023-11-13 MED ORDER — PHENYLEPHRINE 1 MG/10 ML (100 MCG/ML) IN 0.9 % SOD.CHLORIDE IV SYRINGE
INJECTION | INTRAVENOUS | Status: DC | PRN
Start: 2023-11-13 — End: 2023-11-13
  Administered 2023-11-13 (×2): 100 ug via INTRAVENOUS

## 2023-11-13 MED ORDER — LACTATED RINGERS IV INFUSION
INTRAVENOUS | Status: DC
Start: 2023-11-13 — End: 2023-11-13

## 2023-11-13 MED ORDER — CETIRIZINE 10 MG TABLET
10.0000 mg | ORAL_TABLET | ORAL | Status: DC | PRN
Start: 2023-11-13 — End: 2023-11-14

## 2023-11-13 MED ORDER — PROPOFOL 10 MG/ML INTRAVENOUS EMULSION
INTRAVENOUS | Status: AC
Start: 2023-11-13 — End: 2023-11-13
  Filled 2023-11-13: qty 100

## 2023-11-13 MED ORDER — FENTANYL (PF) 50 MCG/ML INJECTION SOLUTION
50.0000 ug | INTRAMUSCULAR | Status: DC | PRN
Start: 2023-11-13 — End: 2023-11-14
  Administered 2023-11-13 (×2): 100 ug via INTRAVENOUS
  Filled 2023-11-13: qty 2

## 2023-11-13 MED ORDER — SUGAMMADEX 100 MG/ML INTRAVENOUS SOLUTION
INTRAVENOUS | Status: DC | PRN
Start: 2023-11-13 — End: 2023-11-13
  Administered 2023-11-13: 200 mg via INTRAVENOUS

## 2023-11-13 MED ORDER — VANCOMYCIN 1,000 MG INTRAVENOUS INJECTION
1000.0000 mg | INTRAVENOUS | Status: AC
Start: 2023-11-13 — End: 2023-11-13
  Administered 2023-11-13: 1000 mg via INTRAVENOUS
  Filled 2023-11-13: qty 10

## 2023-11-13 MED ORDER — DIAZEPAM 5 MG/ML INJECTION SYRINGE
2.5000 mg | INJECTION | Freq: Once | INTRAMUSCULAR | Status: AC
Start: 2023-11-13 — End: 2023-11-13
  Administered 2023-11-13: 2.5 mg via INTRAVENOUS
  Filled 2023-11-13: qty 2

## 2023-11-13 MED ORDER — KETAMINE 50 MG/5 ML (10 MG/ML) IN SODIUM CHLOR,ISO-OSMOTIC IV SYRINGE
INJECTION | INTRAVENOUS | Status: DC | PRN
Start: 2023-11-13 — End: 2023-11-13
  Administered 2023-11-13: 20 mg via INTRAVENOUS

## 2023-11-13 MED ORDER — HYDROMORPHONE 0.5 MG/0.5 ML INJECTION SYRINGE
0.2000 mg | INJECTION | INTRAMUSCULAR | Status: DC | PRN
Start: 2023-11-13 — End: 2023-11-13
  Administered 2023-11-13 (×3): 0.4 mg via INTRAVENOUS
  Filled 2023-11-13 (×3): qty 0.5

## 2023-11-13 MED ORDER — INTRAOP FENTANYL 50 MCG/ML INJ 2 ML AMP
Status: DC | PRN
Start: 2023-11-13 — End: 2023-11-13
  Administered 2023-11-13: 100 ug via INTRAVENOUS
  Administered 2023-11-13: 150 ug via INTRAVENOUS

## 2023-11-13 MED ORDER — POLYETHYLENE GLYCOL 3350 17 GRAM ORAL POWDER PACKET
17.0000 g | Freq: Every day | ORAL | Status: DC
Start: 2023-11-14 — End: 2023-11-14

## 2023-11-13 MED ORDER — LIDOCAINE-EPINEPHRINE (PF) 1 %-1:200,000 INJECTION SOLUTION
INTRAMUSCULAR | Status: DC | PRN
Start: 2023-11-13 — End: 2023-11-13
  Administered 2023-11-13: 8 mL

## 2023-11-13 MED ORDER — THROMBIN (RECOMBINANT) 5,000 UNIT TOPICAL SOLUTION
TOPICAL | Status: AC
Start: 2023-11-13 — End: 2023-11-13
  Filled 2023-11-13: qty 1

## 2023-11-13 MED ORDER — FENTANYL (PF) 50 MCG/ML INJECTION SOLUTION
INTRAMUSCULAR | Status: AC
Start: 2023-11-13 — End: 2023-11-13
  Filled 2023-11-13: qty 5

## 2023-11-13 MED ORDER — MIDAZOLAM 1 MG/ML INJECTION SOLUTION
INTRAMUSCULAR | Status: AC
Start: 2023-11-13 — End: 2023-11-13
  Filled 2023-11-13: qty 2

## 2023-11-13 MED ORDER — HYDROMORPHONE 2 MG/ML INJECTION SOLUTION
INTRAMUSCULAR | Status: DC | PRN
Start: 2023-11-13 — End: 2023-11-13
  Administered 2023-11-13: 1 mg via INTRAVENOUS

## 2023-11-13 MED ORDER — DIAZEPAM 5 MG TABLET
5.0000 mg | ORAL_TABLET | Freq: Two times a day (BID) | ORAL | Status: DC | PRN
Start: 2023-11-13 — End: 2023-11-14

## 2023-11-13 MED ORDER — ROCURONIUM 50 MG/5 ML (10 MG/ML) INTRAVENOUS SYRINGE
INJECTION | INTRAVENOUS | Status: DC | PRN
Start: 2023-11-13 — End: 2023-11-13
  Administered 2023-11-13: 30 mg via INTRAVENOUS

## 2023-11-13 MED ORDER — LIDOCAINE (PF) 8 MG/ML (0.8 %) IN 5 % DEXTROSE INTRAVENOUS SOLUTION
INTRAVENOUS | Status: DC | PRN
Start: 2023-11-13 — End: 2023-11-13
  Administered 2023-11-13: 1.5 mg/kg/h via INTRAVENOUS

## 2023-11-13 MED ORDER — BUPRENORPHINE HCL 2 MG SUBLINGUAL TABLET
4.0000 mg | SUBLINGUAL_TABLET | Freq: Four times a day (QID) | SUBLINGUAL | Status: DC
Start: 2023-11-13 — End: 2023-11-14
  Administered 2023-11-13 – 2023-11-14 (×4): 4 mg via SUBLINGUAL
  Filled 2023-11-13 (×4): qty 2

## 2023-11-13 MED ORDER — LIDOCAINE HCL 20 MG/ML (2 %) INJECTION SOLUTION
INTRAMUSCULAR | Status: DC | PRN
Start: 2023-11-13 — End: 2023-11-13
  Administered 2023-11-13: 100 mg via INTRAVENOUS

## 2023-11-13 MED ORDER — MINERAL OIL ENEMA
1.0000 | ENEMA | RECTAL | Status: DC | PRN
Start: 2023-11-13 — End: 2023-11-14

## 2023-11-13 MED ORDER — INTRAOP PROPOFOL INJ 20 ML VIAL (BOLUS+INFUSION)
Status: DC | PRN
Start: 2023-11-13 — End: 2023-11-13
  Administered 2023-11-13: 200 mg via INTRAVENOUS

## 2023-11-13 MED ORDER — HYDROMORPHONE 2 MG/ML INJECTION SOLUTION
INTRAMUSCULAR | Status: AC
Start: 2023-11-13 — End: 2023-11-13
  Filled 2023-11-13: qty 1

## 2023-11-13 MED ORDER — LIDOCAINE (PF) 10 MG/ML (1 %) INJECTION SOLUTION
INTRAMUSCULAR | Status: DC
Start: 2023-11-13 — End: 2023-11-14
  Filled 2023-11-13: qty 2

## 2023-11-13 MED ORDER — INTRAOP PROPOFOL INJ 100 ML VIAL (BOLUS+INFUSION)
Status: DC | PRN
Start: 2023-11-13 — End: 2023-11-13
  Administered 2023-11-13: 100 mg via INTRAVENOUS
  Administered 2023-11-13: 150 ug/kg/min via INTRAVENOUS

## 2023-11-13 MED ORDER — SENNOSIDES 8.8 MG/5 ML ORAL SYRUP
10.0000 mL | ORAL_SOLUTION | Freq: Every day | ORAL | Status: DC | PRN
Start: 2023-11-13 — End: 2023-11-14

## 2023-11-13 MED ORDER — ONDANSETRON HCL (PF) 4 MG/2 ML INJECTION SOLUTION
4.0000 mg | Freq: Three times a day (TID) | INTRAMUSCULAR | Status: DC | PRN
Start: 2023-11-13 — End: 2023-11-14

## 2023-11-13 MED ORDER — METOPROLOL TARTRATE 50 MG TABLET
50.0000 mg | ORAL_TABLET | Freq: Two times a day (BID) | ORAL | Status: DC
Start: 2023-11-13 — End: 2023-11-14
  Administered 2023-11-14 (×2): 50 mg via ORAL
  Filled 2023-11-13 (×2): qty 1

## 2023-11-13 MED ORDER — LIDOCAINE HCL 10 MG/ML (1 %) INJECTION SOLUTION
0.1000 mL | INTRAMUSCULAR | Status: DC | PRN
Start: 2023-11-13 — End: 2023-11-13

## 2023-11-13 MED ORDER — BISACODYL 5 MG TABLET,DELAYED RELEASE
5.0000 mg | DELAYED_RELEASE_TABLET | ORAL | Status: DC | PRN
Start: 2023-11-13 — End: 2023-11-14

## 2023-11-13 MED ORDER — SENNOSIDES 8.6 MG TABLET
2.0000 | ORAL_TABLET | Freq: Every day | ORAL | Status: DC | PRN
Start: 2023-11-13 — End: 2023-11-14

## 2023-11-13 MED ORDER — CEFAZOLIN IV PUSH ONCALL TO OR
2.0000 g | INTRAMUSCULAR | Status: AC
Start: 2023-11-13 — End: 2023-11-13
  Administered 2023-11-13: 2 g via INTRAVENOUS

## 2023-11-13 MED ORDER — HYDROMORPHONE 0.5 MG/0.5 ML INJECTION SYRINGE
0.4000 mg | INJECTION | INTRAMUSCULAR | Status: DC | PRN
Start: 2023-11-13 — End: 2023-11-13
  Administered 2023-11-13 (×2): 0.5 mg via INTRAVENOUS
  Administered 2023-11-13: 0.6 mg via INTRAVENOUS
  Administered 2023-11-13: 0.5 mg via INTRAVENOUS
  Filled 2023-11-13 (×5): qty 0.5

## 2023-11-13 MED ORDER — KETAMINE 50 MG/5 ML (10 MG/ML) IN 0.9 % SODIUM CHLORIDE IV SYRINGE
INJECTION | INTRAVENOUS | Status: AC
Start: 2023-11-13 — End: 2023-11-13
  Filled 2023-11-13: qty 5

## 2023-11-13 MED ORDER — ACETAMINOPHEN 500 MG TABLET
1000.0000 mg | ORAL_TABLET | Freq: Four times a day (QID) | ORAL | Status: DC
Start: 2023-11-14 — End: 2023-11-14
  Administered 2023-11-14 (×3): 1000 mg via ORAL
  Filled 2023-11-13 (×3): qty 2

## 2023-11-13 MED ORDER — LIDOCAINE-EPINEPHRINE (PF) 1 %-1:200,000 INJECTION SOLUTION
INTRAMUSCULAR | Status: AC
Start: 2023-11-13 — End: 2023-11-13
  Filled 2023-11-13: qty 30

## 2023-11-13 MED ORDER — ATORVASTATIN 40 MG TABLET
80.0000 mg | ORAL_TABLET | Freq: Every day | ORAL | Status: DC
Start: 2023-11-13 — End: 2023-11-14

## 2023-11-13 MED ORDER — FENTANYL (PF) 50 MCG/ML INJECTION SOLUTION
25.0000 ug | INTRAMUSCULAR | Status: DC | PRN
Start: 2023-11-13 — End: 2023-11-13
  Administered 2023-11-13 (×3): 50 ug via INTRAVENOUS
  Administered 2023-11-13: 25 ug via INTRAVENOUS
  Filled 2023-11-13 (×2): qty 2

## 2023-11-13 MED ORDER — MIDAZOLAM (PF) 5 MG/ML INJECTION SOLUTION
INTRAMUSCULAR | Status: DC | PRN
Start: 2023-11-13 — End: 2023-11-13
  Administered 2023-11-13: 2 mg via INTRAVENOUS

## 2023-11-13 MED ORDER — MAGNESIUM HYDROXIDE 400 MG/5 ML ORAL SUSPENSION
30.0000 mL | Freq: Two times a day (BID) | ORAL | Status: DC | PRN
Start: 2023-11-13 — End: 2023-11-14

## 2023-11-13 MED ORDER — HYDROMORPHONE 0.5 MG/0.5 ML INJECTION SYRINGE
0.5000 mg | INJECTION | INTRAMUSCULAR | Status: DC | PRN
Start: 2023-11-13 — End: 2023-11-14
  Administered 2023-11-13 – 2023-11-14 (×2): 0.5 mg via INTRAVENOUS
  Filled 2023-11-13: qty 0.5

## 2023-11-13 MED ORDER — FENTANYL (PF) 50 MCG/ML INJECTION SOLUTION
25.0000 ug | INTRAMUSCULAR | Status: DC | PRN
Start: 2023-11-13 — End: 2023-11-13
  Administered 2023-11-13: 25 ug via INTRAVENOUS
  Filled 2023-11-13: qty 2

## 2023-11-13 MED ORDER — PROPOFOL 10 MG/ML INTRAVENOUS EMULSION
INTRAVENOUS | Status: AC
Start: 2023-11-13 — End: 2023-11-13
  Filled 2023-11-13: qty 20

## 2023-11-13 MED ORDER — VANCOMYCIN 1,000 MG INTRAVENOUS INJECTION
1000.0000 mg | Freq: Two times a day (BID) | INTRAVENOUS | Status: DC
Start: 2023-11-14 — End: 2023-11-14
  Administered 2023-11-14: 1000 mg via INTRAVENOUS
  Filled 2023-11-13 (×2): qty 10

## 2023-11-13 MED ORDER — GELATIN SPONGE,ABSORBABLE-PORCINE SKIN 100 TOPICAL SPONGE
VAGINAL_SPONGE | TOPICAL | Status: AC
Start: 2023-11-13 — End: 2023-11-13
  Filled 2023-11-13: qty 1

## 2023-11-13 MED ORDER — CEFAZOLIN 2 GRAM SOLUTION FOR INJECTION
2.0000 g | Freq: Three times a day (TID) | INTRAMUSCULAR | Status: DC
Start: 2023-11-14 — End: 2023-11-15
  Administered 2023-11-14 (×2): 2 g via INTRAVENOUS
  Filled 2023-11-13 (×2): qty 10

## 2023-11-13 SURGICAL SUPPLY — 43 items
BONE GRAFT I-FACTOR PUTTY 2.5CC 700-025 (Bone) ×2 IMPLANT
BONE WAX 2.5GM (Suture) ×2 IMPLANT
BUR TOOL MR8 14CM CYLINDER 5MM DIAMETER (Bur) ×2 IMPLANT
BUR TOOL MR8 14CM MATCH HEAD 3MM DIAMETER (Bur) ×2 IMPLANT
CAGE ANTERIOR CERVICAL 6MM X 12MM X 14MM 6DEG 48982066 (Cage) ×2 IMPLANT
CAGE ANTERIOR CERVICAL 7MM X 12MM X 14MM 6DEG 48982076 (Cage) ×2 IMPLANT
CATHETER TRAY FOLEY 16FR TEMPERATURE SENSING WITH URIMETER (Catheter) IMPLANT
CAUTERY BOVIE PAD ADULT (Cautery) ×2 IMPLANT
COLLECTOR FOR BONE VACUUM 5400-800-000 (Other) ×2 IMPLANT
DISPOSABLE HEAD HALTER 2970003761 2970003760 (Other) ×2 IMPLANT
DISTRACTION DISP PIN 14MM MDS9091414T (Pin) IMPLANT
DRAIN WOUND SUCTION KIT (Drain) ×2 IMPLANT
DRAPE 76IN X 96IN XL (Drape) ×2 IMPLANT
DRAPE C-ARMOR (Drape) ×2 IMPLANT
DRAPE MICROSCOPE ZEISS PENTERO/VARI LENS (Drape) ×2 IMPLANT
DRAPE PAPER TOWEL BLUE DISPOSABLE (Drape) ×4 IMPLANT
DRAPE STERI 17 X 11 3M 1000 (Drape) ×8 IMPLANT
DRESSING DERMABOND ADVANCED TISSUE ADHESIVE (Dressing) ×2 IMPLANT
DRESSING GAUZE DUSOFT 2 X 2IN (Dressing) ×2 IMPLANT
DRESSING TEGADERM 4IN X 4.75FT LATEX FREE (Dressing) ×2 IMPLANT
DRILL BIT 2.3 X 14MM 8801-90069 (Bit) ×2 IMPLANT
DRUG SURGIFLO MATRIX WITH THROMBIN 8ML 2994 (Sealant) ×4 IMPLANT
GLOVES BIOGEL PI MICRO INDICATOR 8 UNDERGLOVE LATEX FREE (Glove) ×2 IMPLANT
GLOVES BIOGEL PI ULTRATOUCH M 8 TOP GLOVE LATEX FREE LIGHT GREEN PACKAGE (Glove) ×4 IMPLANT
HALTER HEAD STRAP 00091200000 (Retractor) ×2 IMPLANT
HEMOSTAT SURGICEL ABSORB 4X4IN 1963 (Sealant) ×2 IMPLANT
INSTRUMENT VISIWIPE 7.3 X 7.3CM STERILE (Wipe) ×4 IMPLANT
K2M SCREW ANTERIOR CERVICAL ST 4 X 16MM 8801-04016CA (Screw) ×12 IMPLANT
NEEDLE BLUNT 15 GAUGE 1 1/2IN (Needle) ×2 IMPLANT
NEEDLE SPINAL 18 GAUGE X 3 1/2IN (Needle) ×2 IMPLANT
PACK SPINE CERVICAL (Pack) ×2 IMPLANT
PIN TEMPORARY FIXATION FINE 8801-90075 (Pin) ×2 IMPLANT
PLATE ANTERIOR CERVICAL LEVEL 2 42MM AA01-42F42V (Plate) ×2 IMPLANT
SPONGE COTTONOID 1 X 1IN 3/4 X 3/4IN (Sponge) ×2 IMPLANT
SPONGE COTTONOID 1 X 3IN (Sponge) ×2 IMPLANT
SPONGE COTTONOID 1/2 X 1/2IN (Sponge) ×2 IMPLANT
SPONGE PEANUT 3/8 IN (Sponge) ×2 IMPLANT
SUTURE SILK 2-0 TIES 18IN 12 PACK BLACK (Suture) ×2 IMPLANT
SUTURE VICRYL PLUS ANTIBACTERIAL 3-0 18IN (Suture) ×2 IMPLANT
SYRINGE BD LUER-LOK 30ML LATEX FREE STERILE CONCENTRIC TIP GRADUATE NONPYROGENIC DEHP FREE MEDICAL DISPOSABLE (Syringe) ×2 IMPLANT
TAPE SILK 3IN DURAPORE (Tape) ×2 IMPLANT
TRACTION ROPE (Drape) ×2 IMPLANT
WRITESITE PLUS SKIN MARKER STERILE 2700 (Marker) ×2 IMPLANT

## 2023-11-13 NOTE — Nurse Transfer Note (Signed)
 PACU RN Transfer of  Nursing Care     Report given to [facility], [place]. Patient condition stable, but with poor pain control. Bed locked and low, alarms audible and appropriate, and emergency equipment at bedside.    Continuous Medications  Lactated Ringers, , IV, CONTINUOUS, Last Rate: 20 mL/hr at 11/13/23 2045    Last Recorded Vital Signs  Temp: 36 C (96.8 F), BP: 114/75, Pulse: 87, Resp: 18, SpO2: 93 %    Richard Champion, RN  11/13/2023, 22:54

## 2023-11-13 NOTE — Anesthesia Preprocedure Evaluation (Addendum)
 Anesthesia Evaluation      Review of systems  HPI: 81yr male with PMH of anxiety, CAD/MI (s/p cardiac stent on ASA and prasugrel) diverticulitis, and cervical myelopathy who presents for C5-7 ACDF.      Anesthesia History    Negative anesthesia history         Pulmonary    Negative pulmonary ROS     Cardiovascular      Exercise tolerance: good  Past MI  CABG/stent       Neuro/Psych    Negative neuro/psych ROS   History of Smoking    No history of smoking  Non-smoker       GI/HEPATIC/RENAL    GI/Hepatic/Renal ROS comments: Hx of Crohn's disease on remicaid q 6 weeks.minimally symptomatic.     ENDO/Other    Negative endo/other ROS    Prediabetes (jardiance 5/9 AM)  No diabetes         Hematology    Hematology ROS comments: Stopped dual platelet rx one week ago but taking asa at dr orders.    Anticoagulation therapy (ASA 5/9, pasugrel 5/3)   Oncology    Negative oncology ROS               Physical Exam    Airway:    Mallampati score III  Thyromental distance >3 FB  Neck range of motion: full       Cardiovascular:    Rhythm regular  Rate normal       Dental:    Teeth condition:  missing and poor dentition       Pulmonary:    Clear to auscultation       Abdominal:                 Anesthesia Plan    ASA 3     general     Patient is not a current smoker.      Postoperative administration of opioids is intended.  Trial extubation is planned.      Patient willing to receive blood products.  Anesthetic plan, perioperative analgesia plan, risks and benefits were discussed with patient.   Resident/Fellow/CRNA has discussed the plan with the attending.   I confirmed the pre-anesthetic exam and prescribed the anesthesia plan.  I reviewed the available note and agree with the documentation.

## 2023-11-13 NOTE — Anesthesia Procedure Notes (Signed)
 ANESTHESIA AIRWAY  Date/Time: 11/13/2023 4:36 PM    Staff    Patient location:  [facility] OR  Merrick Abe, MD.  Performing Resident/CRNA: Jeneal Mins, Calvin Caulk        Indications and Patient Condition    Spontaneous Ventilation: absent  Sedation level: level 0: deep/analgesia  Preoxygenated: yes  Patient position: sniffing  MILS maintained throughout  Mask difficulty assessment: 3 - difficult mask (inadequate, unstable or two providers) +/- NMBA    Final Airway Details    Final airway type: endotracheal airway        Successful airway: cuffed ETT     Successful intubation technique: flexible bronchoscopy (elective)Endotracheal tube insertion site: oral  ETT size: 7.5 mm  Placement verified by: chest auscultation, bronchoscopy and capnometry   Measured from: lips  Secured at 21 cm.  Number of attempts at approach: 1

## 2023-11-13 NOTE — Nurse Assessment (Signed)
 PACU ADMIT NURSING NOTE    Note Started: 11/13/2023, 20:30     Received patient from OR at 2030 hours via bed.  Monitor and Alarms on.  Patient sleepy but arousable. Torri Langston, RN

## 2023-11-13 NOTE — Brief Op Note (Signed)
 RESIDENT BRIEF OPERATIVE NOTE  Service Neuro Spine  11/13/2023, 20:02    Date of Service: 11/13/2023    Pre-Op Diagnosis: Cervical myelopathy (HCC) [G95.9]    Post-Op Diagnosis:  Post-Op Diagnosis Codes:     * Cervical myelopathy (HCC) [G95.9]    Procedure Performed/Description:  Procedure(s) with comments:  DISCECTOMY, SPINE, CERVICAL, 2 LEVELS, ANTERIOR APPROACH, WITH FUSION (N/A) - C5-7  MONITORING, NERVE (N/A)    Name of Surgeon and Assistants:  Surgeons and Role:     * Price, Janese Medicine, MD - Primary     [facility], Alain Howard, MD - Resident - Assisting     * Elie Leppo, Britta Candy, MD - Resident - Assisting    Type of Anesthesia:  General    ASA Class: ASA 3 - Patient with severe systemic disease    ASA Emergency Status: Non-emergency operation    Wound Classification: Clean    Counts Correct? Yes     DVT Prophylaxis:  SCDs    Findings:  C5-7 ACDF confirmed with intra-op fluoroscopy. One HV drain left in place. Skin closed with dermabond.     Specimens Removed:  * No specimens in log *    EBL:  25 ml    Drains:  HV x1     Wound Vac Placed/Replaced: No     Fluids:  Per anesthesia     Complications:  None apparent    Outcome: Pending        Comorbidity/Risk Capture:     Wound Classification: Clean  Case Type:  Elective  Was infection present at time of surgery? Infection was NOT present at time of operation  Closing Tray Protocol (Policy OR.PC.10) Followed? Not Applicable    Gwyndolyn Lerner, MD  PGY-2 Neurological Surgery  Service Consult Pager: [phone]  Urgent Vascular Team: [phone]  Spine Team: [phone]  Pediatric Team: [phone]  Cranial Team: [phone]

## 2023-11-13 NOTE — Progress Notes (Signed)
 DEPARTMENT OF NEUROLOGICAL SURGERY  PROGRESS NOTE      Today's Date: 11/13/2023     POD:  0    S/P: C5-7 ACDF    Brief HPI  Samuel Cobb is a 67yr year old male with PMH of anxiety, CAD/MI (s/p cardiac stent on ASA and prasugrel) diverticulitis, and cervical myelopathy who presents for C5-7 ACDF.     24-Hour Events  - s/p C5-7 ACDF  - waking up from anesthesia     Physical Exam  Neuro Critical Care Summary Report  Gen: lying in bed, A&Ox3, cooperative  GCS: E3  M6  V5   Cranial Nerves: FS  Motor:     Deltoid Biceps Triceps Wrist Flexion Wrist Extension Hand Intrinsics   Right 4+ 4+ 4+ 5 5 4+   Left 5 5 5 5 5 5         Hip Flexion Knee Extension Knee Flexion Dorsiflexion Great Toe Extension Plantar Flexion   Right 5 5 5 5 5 5    Left 5 5 5 5 5 5      Sensory:  Sensation diminished along the posterior aspect of the right upper extremity into the 4-5th digits, otherwise sensation intact and symmetric to LT throughout      Surgical Wound:   Anterior neck: c/d/i     Drain type and output last 24 hours  Output by Drain (mL) 11/10/23 0000 - 11/10/23 2359 11/11/23 0000 - 11/11/23 2359 11/12/23 0000 - 11/12/23 2359 11/13/23 0000 - 11/13/23 2035 Range Total   Requested LDAs do not have output data documented.       ICP and CPP for last 24 hours  No data recorded    Weight: 75.8 kg (167 lb 1.7 oz)      No head circumference on file for this encounter.     Assessment and Plan  Neuro:   #Cervical myelopathy s/p C5-7 ACDF   Neuro checks Q4  Drains: HV x1  Imaging needed: C-spine ups  Brace: None  Incision: Closed with dermabond.  Ancef and vanc x24 hr  Buprenorphine 4mg  Q6 - PCP to continue to prescribe post-op (no need for pain medications upon discharge)  PT/OT    CV: # CAD, MI  SBP goal: 100-160  Continue home ASA 81 daily  Hold home prasugrel, restart TBD    Resp: On room air     Endo:   BG goal 100-180    Heme:   Plt goal > 90  Hg goal > 7  INR goal < 1.4    ID: Afebrile     GI:   Diet: CLEAR LIQUID DIET  ADVANCE AS TOLERATED  DIET   Last Bowel Movement: 11/12/23     GU: #NAI       Reason for Foley if present: NA, not present    VTE Prophylaxis: Anticoagulation   Pharmacological DVT Prophylaxis: LVX POD 2  Reason if not on ppx: Recent NSGY    Lines:  Peripheral IV 11/13/23 18 g over-the-needle catheter Posterior;Right Hand (Active)   Number of days: 0       Peripheral IV 11/13/23 16 g over-the-needle catheter system Left;Posterior Hand (Active)   Number of days: 0     Reason for CVC if present: NA, Not Present    Disposition:   Pending clinical course    Gwyndolyn Lerner, MD  PGY-2 Neurological Surgery  Service Consult Pager: [phone]  Urgent Vascular Team: [phone]  Spine Team: 3350  Pediatric Team: 3353  Cranial Team:  3352      -----------------------------------------------------------------------------------    Objective    OBJECTIVE DATA: Active Meds   IV Continuous Medications    Lactated Ringers,         Scheduled Medications   [START ON 11/14/2023] Acetaminophen (TYLENOL) Tablet 1,000 mg, ORAL, Q6H  Buprenorphine (SUBUTEX) Sublingual Tablet 4 mg, SUBLINGUAL, Q6H Now  [START ON 11/14/2023] CeFAZolin (KEFZOL, ANCEF) IV 2 g, IV, Q8H Now  LIDOCAINE  (PF) 10 MG/ML (1 %) INJECTION SOLUTION, ,   [START ON 11/14/2023] Polyethylene Glycol 3350 (MIRALAX) Oral Powder Packet 17 g, ORAL, QAM  [START ON 11/14/2023] Vancomycin (VANCOCIN) 1,000 mg in NaCl 0.9% 280 mL Vial [employer]or IVPB, IV, Q12H Now        Vital Signs (ranges last 24 hours)  Weight: 75.8 kg (167 lb 1.7 oz)  24 Hour Range Current   BP  Min: 101/78  Max: 101/78   101/78   Temp  Min: 36.6 C (97.9 F)  Max: 36.9 C (98.4 F)  36.6 C (97.9 F)   Pulse  Min: 60  Max: 60  60   Resp  Min: 14  Max: 14  14   SpO2  Min: 96 %  Max: 96 %  96 %       I&O-24 HR (total in, total out, net)  AUTOFILL   Intake:  0    Output: 0  Net:  0        Ventilator Settings       Labs  No results for input(s): "WBC", "HGB", "HCT", "PLT" in the last 48 hours.   No results for input(s): "NA", "GLU", "K", "BUN", "CR", "CL",  "CO2", "CA" in the last 48 hours.   No results for input(s): "MG" in the last 48 hours.

## 2023-11-13 NOTE — Nurse Assessment (Signed)
 PREOP ADMIT NURSING NOTE    Note Started: 11/13/2023, 14:06     Received patient from as a direct admit at 1400 hours via ambulation.  Patient status  awake and alert.  Oriented to room and unit.  Admission assessment and care plan initiated.   PIN number cards provided to patient.   Harlene Lie, RN

## 2023-11-14 ENCOUNTER — Other Ambulatory Visit: Payer: Self-pay

## 2023-11-14 ENCOUNTER — Inpatient Hospital Stay (HOSPITAL_COMMUNITY): Payer: MEDICAID

## 2023-11-14 DIAGNOSIS — Z0289 Encounter for other administrative examinations: Secondary | ICD-10-CM

## 2023-11-14 DIAGNOSIS — F1120 Opioid dependence, uncomplicated: Secondary | ICD-10-CM

## 2023-11-14 DIAGNOSIS — M542 Cervicalgia: Secondary | ICD-10-CM

## 2023-11-14 DIAGNOSIS — G8929 Other chronic pain: Secondary | ICD-10-CM

## 2023-11-14 DIAGNOSIS — Z4889 Encounter for other specified surgical aftercare: Secondary | ICD-10-CM

## 2023-11-14 LAB — BASIC METABOLIC PANEL
Anion Gap: 17 mmol/L — ABNORMAL HIGH (ref 7–15)
Calcium: 8.9 mg/dL (ref 8.6–10.0)
Carbon Dioxide Total: 16 mmol/L — ABNORMAL LOW (ref 22–29)
Chloride: 105 mmol/L (ref 98–107)
Creatinine Serum: 0.75 mg/dL (ref 0.51–1.17)
E-GFR Creatinine (Male): >100 mL/min/{1.73_m2}
Glucose: 114 mg/dL — ABNORMAL HIGH (ref 74–109)
Potassium: 4.3 mmol/L (ref 3.4–5.1)
Sodium: 138 mmol/L (ref 136–145)
Urea Nitrogen, Blood (BUN): 20 mg/dL (ref 6–20)

## 2023-11-14 LAB — CBC NO DIFFERENTIAL
Hematocrit: 43.6 % (ref 40.0–52.0)
Hemoglobin: 14.9 g/dL (ref 14.0–18.0)
MCH: 28.3 pg (ref 27.0–33.0)
MCHC: 34.1 % (ref 32.0–36.0)
MCV: 82.8 fL (ref 80.0–100.0)
MPV: 8.3 fL (ref 6.8–10.0)
Platelet Count: 266 10*3/uL (ref 130–400)
RDW: 15.2 % — ABNORMAL HIGH (ref 0.0–14.7)
Red Blood Cell Count: 5.26 10*6/uL (ref 4.50–5.90)
White Blood Cell Count: 15.1 10*3/uL — ABNORMAL HIGH (ref 4.5–11.0)

## 2023-11-14 LAB — MAGNESIUM (MG): Magnesium (Mg): 2.2 mg/dL (ref 1.6–2.4)

## 2023-11-14 LAB — C DIFFICILE SURVEILLANCE TEST: Test Result: NEGATIVE

## 2023-11-14 LAB — PHOSPHORUS (PO4): Phosphorus (PO4): 4.3 mg/dL (ref 2.5–4.5)

## 2023-11-14 MED ORDER — CYCLOBENZAPRINE 10 MG TABLET
10.0000 mg | ORAL_TABLET | Freq: Three times a day (TID) | ORAL | Status: DC | PRN
Start: 2023-11-14 — End: 2023-11-14
  Administered 2023-11-14: 10 mg via ORAL
  Filled 2023-11-14: qty 1

## 2023-11-14 MED ORDER — OXYCODONE 5 MG TABLET
5.0000 mg | ORAL_TABLET | ORAL | Status: DC | PRN
Start: 2023-11-14 — End: 2023-11-14

## 2023-11-14 MED ORDER — NALOXONE 0.4 MG/ML INJECTION SOLUTION
0.1000 mg | INTRAMUSCULAR | Status: DC | PRN
Start: 2023-11-14 — End: 2023-11-14

## 2023-11-14 MED ORDER — SENNOSIDES 8.6 MG TABLET
2.0000 | ORAL_TABLET | Freq: Every day | ORAL | 0 refills | Status: AC
Start: 2023-11-14 — End: 2023-12-14
  Filled 2023-11-14: qty 30, 15d supply, fill #0

## 2023-11-14 MED ORDER — OXYCODONE 10 MG TABLET
10.0000 mg | ORAL_TABLET | ORAL | 0 refills | Status: AC | PRN
Start: 2023-11-14 — End: 2023-11-21
  Filled 2023-11-14: qty 42, 7d supply, fill #0

## 2023-11-14 MED ORDER — LIDOCAINE 5 % TOPICAL PATCH
3.0000 | MEDICATED_PATCH | TOPICAL | Status: DC
Start: 2023-11-14 — End: 2023-11-14
  Administered 2023-11-14: 3 via TRANSDERMAL
  Filled 2023-11-14: qty 3

## 2023-11-14 MED ORDER — NACL 0.9% IV INFUSION
INTRAVENOUS | Status: DC | PRN
Start: 2023-11-14 — End: 2023-11-14

## 2023-11-14 MED ORDER — GABAPENTIN 300 MG CAPSULE
300.0000 mg | ORAL_CAPSULE | Freq: Three times a day (TID) | ORAL | Status: DC | PRN
Start: 2023-11-14 — End: 2023-11-14

## 2023-11-14 MED ORDER — NALOXONE 4 MG/ACTUATION NASAL SPRAY
NASAL | 0 refills | Status: AC
Start: 2023-11-14 — End: 2024-11-08
  Filled 2023-11-14: qty 2, 1d supply, fill #0

## 2023-11-14 MED ORDER — ACETAMINOPHEN 500 MG TABLET
1000.0000 mg | ORAL_TABLET | Freq: Four times a day (QID) | ORAL | 0 refills | Status: AC
Start: 2023-11-14 — End: 2023-12-14
  Filled 2023-11-14: qty 240, 30d supply, fill #0

## 2023-11-14 MED ORDER — POLYETHYLENE GLYCOL 3350 17 GRAM/DOSE ORAL POWDER
17.0000 g | Freq: Every day | ORAL | 0 refills | Status: AC
Start: 2023-11-15 — End: 2023-12-15
  Filled 2023-11-14: qty 510, 30d supply, fill #0

## 2023-11-14 MED ORDER — HYDROMORPHONE 0.5 MG/0.5 ML INJECTION SYRINGE
0.5000 mg | INJECTION | INTRAMUSCULAR | Status: DC | PRN
Start: 2023-11-14 — End: 2023-11-14

## 2023-11-14 MED ORDER — MORPHINE 2 MG/ML INJECTION SYRINGE
2.0000 mg | INJECTION | INTRAMUSCULAR | Status: DC | PRN
Start: 2023-11-14 — End: 2023-11-14

## 2023-11-14 MED ORDER — OXYCODONE 5 MG TABLET
10.0000 mg | ORAL_TABLET | ORAL | Status: DC | PRN
Start: 2023-11-14 — End: 2023-11-14
  Administered 2023-11-14 (×3): 10 mg via ORAL
  Filled 2023-11-14 (×3): qty 2

## 2023-11-14 NOTE — Op Note (Signed)
 INPATIENT OPERATION RECORD    PATIENT: Samuel Cobb  MR#: [id number]  DOB: 02-10-1986  SEX: male     AGE: 16yr  OPERATION DATE: 11/13/2023    PRE-OP DIAGNOSIS: Cervical myelopathy (HCC) [G95.9]    POST-OP DIAGNOSIS:  Post-Op Diagnosis Codes:     * Cervical myelopathy (HCC) [G95.9]    PROCEDURE PERFORMED:    Anterior interbody fusion, with discectomy and decompression, C5-7  Insertion of interbody biomechanical device.C5-7  Anterior instrumentation, C5-7  Use of morselized allograft  Use of morselized autograft  Use of intraoperative neuromonitoring  Use of intraoperative fluoroscopy <60 minutes without radiologist, [id number]  Use of intraoperative microscope, [phone]    INDICATIONS FOR PROCEDURE:  The patient has a diagnosis of Cervical myelopathy (HCC) [G95.9] with specific symptoms of ataxia and loss of hand dexterity. Imaging revealed severe stenosis at C5-7. Given the myelopathy, we decided to proceed with surgery.. The patient provided written informed consent to proceed with surgery.       DETAILS OF PROCEDURE:    The patient was identified in the preoperative holding area. Consent and surgical level were verified. The risks, benefits, and alternatives of surgery were reviewed, and the patient accepted and desired to proceed. All questions were answered, and no guarantees were given or implied. The patient voiced an understanding of the surgical plan and postoperative care. Images were reviewed and surgical level and plan confirmed.     The patient was brought into the operating theater where anesthesia was induced. Line access was performed. Neuromonitoring, including MEPs and SSEPs, was set up and followed for the duration of the procedure. The patient was positioned supine. Care was taken to pad all bony prominences from the head, upper extremities, trunk, and lower extremities.  The operative levels were identified via fluoroscopy.  A transverse incision was planned and marked on the patient's anterior neck.  The  patient was prepped and draped in the standard surgical fashion. A time-out was performed. Antibiotics were administered, and we proceeded with the surgery.     We made a left-sided transverse incision after localizing fluoroscopy images were used to identify the appropriate levels. The area of interest was from C5 and C7 utilizing the sternal notch and sternocleidomastoid as a surface anatomic landmark. Sharp dissection was carried through skin followed by bipolar cautery, and then Metzenbaum scissors used for the dissection through the dermis identifying the platysma. The platysma was then split in line with our skin incision. After the platysma was incised, blunt dissection utilizing a Kitner as well as blunt finger dissection, we identified the plane utilizing the Smith-Robinson anterior approach. The anterior border of the sternocleidomastoid muscle was identified and used as anatomic reference. The carotid sheath was identified by palpation and retracted gently laterally. The pretracheal fascia was then bluntly dissected, and the trachea and esophagus were noted at the midline and retracted slight laterally from the midline. We identified the longus coli muscles to either side of the vertebral bodies. Meticulous hemostasis was achieved with bovie, bipolar electrocautery, gelfoam, and thrombin.     The anterior spine was then exposed longitudinally reflecting the longus coli muscles on either side. Fluoroscopy was used to confirm the levels of interest, which were C5-7. We then began our subperiosteal dissection, dissection along the vertebral bodies of C5-7, dissecting laterally in a subperiosteal fashion under the longus coli. Adequate exposure was utilized and then [employer] pins were placed. Self retaining retractors were placed both medially & laterally. The esophagus was well protected for the duration  of our procedure.      Next, we turned our attention to the anterior cervical decompression at C5/6   levels. We brought in the operating microscope and performed our discectomy. An annulotomy was made using a 15 blade followed by pituitary rongeur to remove disc material. A combination of curettes and Kerrison rongeurs were then utilized to bring our discectomy completely back to the uncinate processes as well as the posterior anulus and posterior longitudinal ligament. This was then freed up with upgoing curettes.  We then used a distractor to further open the disc space.  We then utilized a combination of bur and Kerrison rongeurs to level off our uncinate processes as well as perform a ventral foraminotomy bilaterally. We then utilized a nerve hook to identify the PLL and a Kerrison rongeur was utilized to resect the PLL. Compressive soft tissue including the disc fragments were also removed with Kerrison rongeur. Next, we turned our attention to the interbody fusion and anterior spinal instrumentation. Once the endplates were prepared, we trialed and selected a 6 mm cage filled with local autograft and allograft. We then advanced it into the disc space and lateral fluoroscopic images were taken that demonstrated an appropriate depth.     We then turned our attention to the anterior cervical decompression at C6/7 levels. We brought in the operating microscope and performed our discectomy. An annulotomy was made using a 15 blade followed by pituitary rongeur to remove disc material. A combination of curettes and Kerrison rongeurs were then utilized to bring our discectomy completely back to the uncinate processes as well as the posterior anulus and posterior longitudinal ligament. This was then freed up with upgoing curettes.  We then used a distractor to further open the disc space.  We then utilized a combination of bur and Kerrison rongeurs to level off our uncinate processes as well as perform a ventral foraminotomy bilaterally. We then utilized a nerve hook to identify the PLL and a Kerrison rongeur was  utilized to resect the PLL. Compressive soft tissue including the disc fragments were also removed with Kerrison rongeur. Next, we turned our attention to the interbody fusion and anterior spinal instrumentation. Once the endplates were prepared, we trialed and selected a 7 mm cage filled with local autograft and allograft. We then advanced it into the disc space and lateral fluoroscopic images were taken that demonstrated an appropriate depth.     A plate was then measured and placed on the anterior vertebral bodies. Lateral and AP fluoroscopy were used to optimize the position of our plate. It was then drilled with a hand drill and screws were placed to secure the plate. Final fluoroscopic images were then taken that demonstrated good hardware position with restoration of the disc height.     Copious irrigation was then undertaken at this point, and attention was paid to hemostasis to ensure a clean and dry surgical wound. A deep medium Hemovac drain was placed, followed by wound closure in layers. The platysma was closed with interrupted 3-0 Vicryl pop-off sutures followed by inverted dermal 3-0 Vicryl pop-off sutures. The skin was closed with dermabond     At the conclusion of the procedure, the patient was extubated, awake, and moving x 4, and was taken to PACU.     INSTRUMENTATION: [employer]     NAME OF SURGEON AND ASSISTANTS:  Surgeons and Role:     * Josseline Reddin, Janese Medicine, MD - Primary     [facility], Alain Howard, MD -  Resident - Assisting     * Jude, Britta Candy, MD - Resident - Assisting    TYPE OF ANESTHESIA:  General    WOUND CLASSIFICATION: Clean    SPECIMENS REMOVED:  * No specimens in log *    EBL:  25 ml    FLUID: Please see Anesthesia Record     DRAIN: 1 hemovac     COMPLICATIONS: None     OUTCOME: Stable to PACU    DVT PROPHYLAXIS: Sequential compression device    OVERLAPPING SURGERY:   As the responsible surgeon, I was present for the critical portions of the case. Dr. Gaylyn Keas was present or  immediately available during the non-critical portions. The critical   portions were discectomy and placement of interbody.  I was immediately available to return immediately to the operating room during the entire case.       Hanan Mcwilliams L. Marlow Sin, MD, PhD  Assistant Professor  Department of Neurosurgery  [place] [facility] [place]

## 2023-11-14 NOTE — Allied Health Progress (Signed)
 PHYSICAL THERAPY NOTE      Date of Note: 11/14/2023      PT orders received, chart reviewed. Communicated with OT who reports patient is independent and cleared for DC home.  No PT indicated at this time.    JACK Donyell Carrell (jc), NCS, CCS-paneled, [place]  [id number]

## 2023-11-14 NOTE — Care Plan (Addendum)
 Patient with discharge orders; medication, appointments and instructions given to patient and his mom, well understood. PIV removed with orders. Medications picked up at [facility] on their way out. Patient walked out of the unit accompanied with his mother, left in good condition. Care Plan resolved, Goals met. VSS.    Gerri Kras

## 2023-11-14 NOTE — Nurse Assessment (Signed)
 Note started 08/20/2022      Patient's orientation is to person, place, time, and situation. Functional ability is independent. Ambulatory, BRP ad lib. Vital signs are stable. Patient's mood/affect is appropriate. Emotional support and reassurance provided. PRN pain medicine given for c/o incisional pain at neck anterior area, with some relief. Report received from night shift RN. Plan of care discussed with patient. Nursing care plan is appropriate.     Wendell Halt, RN

## 2023-11-14 NOTE — Nurse Assessment (Signed)
 Awake, alert and oriented x4. Verbally responsive with appropriate answers.  No shortness of breath noted. Medicated with oxycodone and tylenol for neck pain. Cervical collar in place. Neck dressing dry and intact. Urinal at the bedside. Call light within reach. Care plan reviewed and is appropriate.  Evaristo Hind, RN

## 2023-11-14 NOTE — Nurse Focus (Signed)
Assumed care from RN Anna Marie for 30 minutes lunch break .    Annaston Upham Rose Keyonte Cookston, RN

## 2023-11-14 NOTE — Clinical Case Management (Signed)
 Clinical Case Management Documentation    Name: Samuel Cobb  MRN: [id number]   Date of Birth: [birth year] (38yr) Gender: male    Note Date: 11/14/2023 Note Time: 11:35     Current Isolation(s): No active isolations  Current Infection(s): No active infections      INITIAL ASSESSMENT NOTE    Patient was transferred from Networked reference to record EAF .  Takeback Letter: No    Permanent Address: [address]  [place]  [place] [address]  Discharge Address: [address]   [place] [address]    Patient can follow-up with:   PCP: Marvina Slough, NP  / Phone Number: [phone]  Preferred Pharmacy: [phone] - [place], [place] - [address], [phone] PH [phone] FX  [facility] - [place] - [address], [phone] PH [phone] FX  [facility]  [place]  [phone]      Funding/Billing: Payor: [employer] / Plan: [employer] / Product Type: *No Product type* /    Secondary Insurance:   Reason for admission: Cervical myelopathy    Patient able to participate in plan?: Yes  Is an interpreter used?: No  Living arrangements: Parent  Type of Residence: private residence  Home Environment (floors/stairs): [facility]  Pre-Hospitalization self-care deficits: None  Pre-Hospitalization mobility: Independent  Bladder function: Continent  Bowel function: Continent  Pre-Hospital Services: None  Type of home health care services in place: None  Anticipated Disposition: Home  DME in place: None  Patient's goal upon discharge (in patient's own words) : HOME  Does the patient have ongoing DC Planning needs?: TBD  Is the patient ready for discharge today?: No     No post-discharge plans have been finalized at this time.    Comments:   62yr year old male with PMH of anxiety, CAD/MI (s/p cardiac stent on ASA and prasugrel) diverticulitis, and cervical myelopathy who presents for C5-7 ACDF.     CM spoke with  patient and discussed discharge plan and goals. Patient verified above demographics. Patient lives with parents in a single level home.   Pt is independent at baseline without use of AD prior admission.   he is agreeable to Home Health services if needed.   Transportation will be provided by family at discharge if appropriate.          Follow-up/recommendations:  Discharge planning will continue to follow patient and assist with developing d/c plan as patient stabilizes and discharge needs are more clearly identified.     Date/Time: 11/14/2023 11:36  Electronically Signed by:   Boby Bury, BSN, RN  Clinical Case Manager  Department of Clinical Case Management  Available via Secure Chat and Teams    Regular schedule: M-F 7:30am to 4:00pm      After 4:00pm please refer to the CCM daily assignment sheet for after-hours CM coverage

## 2023-11-14 NOTE — Allied Health Consult (Signed)
 Orthotics/Prosthetics Inpatient Progress Note    Progress Note: [name] was seen 11/14/2023 for a [employer].  Patient has an active Doctors referral/verbal order to be evaluated and treated for this device.  It has been determined by the referring physician that this device is medically necessary, can functionally benefit the patient, and is required stabilization for medical reasons.     Subjective:     Samuel Cobb is a 38yr old male.     Dx:  See chart   Problem List[1]    Service Provided: Advised Ortho was to apply    Base Codes: L0172x1: Collar, prefabricated, includes fitting and adjustment     Quantity: 1    Wear schedule: as prescribed    Handout: Handout was delivered in the package for staff to review with patient and/or their family or caregiver.    Lannette Pitter, [facility]  Orthotic Department        [1]   Patient Active Problem List  Diagnosis    Chronic abdominal pain    Anxiety    Degeneration of cervical intervertebral disc    Cervical myelopathy (HCC)    Cervical stenosis of spinal canal    Cervical radicular pain

## 2023-11-14 NOTE — Nurse Transfer Note (Signed)
 PACU Transfer Note, RN Accompanying Patient  Note started on 11/14/2023, 01:06    Patient transferred to E5 unit via bed with room air at 0040 hours without incident. Bed down, locked, side rails up times 4, and call light in reach. Belongings with patient including phone. Report in EMR and RN notified of arrival. Assessment unchanged. Donnelly Gainer, RN

## 2023-11-14 NOTE — Care Plan (Signed)
 Problem: Adult Inpatient Plan of Care  Goal: Plan of Care Review  Outcome: Ongoing, Progressing  Flowsheets (Taken 11/14/2023 0353)  Outcome Evaluation: Ox4. Medicated with oxycodone for neck pain. Aspen collar in place. On IV abx. Urinal within reach. Call light within reach.

## 2023-11-14 NOTE — Allied Health Consult (Signed)
 PM & R -- ACUTE CARE SERVICE    OCCUPATIONAL THERAPY EVALUATION      Name: Samuel Cobb   MRN: [id number]   Date of Service: 11/14/2023  Hospital Unit: E5  Time In:  0828       OT Eval Time (minutes): (P) 20  OT Treatment Time (minutes): (P) 0  Total Minutes, Occupational Therapy: (P) 20  Occupational Therapy visit number: (P) 1  Mobility Performed: (P) ambulated outside room  No OT Barriers to Discharge: (P) True     Nursing Recommendations: BMAT 4, up ad lib    CURRENT Occupational Therapy Discharge Recommendations:        Disposition: Home        Has the patient met minimum therapy prerequisites for recommended disposition?    Recommended follow up therapy after discharge:  No further OT services needed after discharge  Referral Recommendations (for follow up):  N/A                    Equipment:  N/A Equipment Size: N/A          CHART REVIEW                                                                                                                                           INTAKE INFORMATION AND HISTORY:                       Authorizing Provider and PI #:  Damien Dukes, MD [phone]   Primary Service: (A) Neurosurgery   Date of Admission: 11/13/2023  Diagnosis: Cervical myelopathy (HCC) [G95.9]   Language: [place]   Interpreter Used? NA      Precautions: c-spine, no brace needd  Isolation Precautions: Not applicable    History of Present Illness/Injury (Including pertinent test results & procedures):  C5-7 ACDF        Past Medical History:    Past Medical History[1]      Past Surgical History  Past Surgical History[2]       Social History:    Social History[3]    SUBJECTIVE  I feel good   Pain Rating:  No apparent distress  Pain Location: Patient does not specify.  Pain  Intervention: N/A    Living Environment: lives in a multi story home   Environmental Accessibility: stairs   Adaptive Equipment Owned: none  Horticulturist, commercial Owned: none  Prior Level of Function: independent   History of Falls (last 6 months): none  Caregiver Support: has social support       OBJECTIVE  OCCUPATIONAL THERAPY EXAMINATION:  RN cleared patient to work with OT.     Observation: Alert, agreeable, c-collar donned     Vitals:  Chart review/bedside RN indicates vitals stable prior to session.      Cognition: WNL    Sensation: intermittent  paraesthesias along R ulnar n. Distrubution. Pt denies paraesthesias post op.    Range of Motion: WNL  Strength: WNL    FUNCTIONAL MOBILITY ASSESSMENT  Bed Mobility Assessment/Comments:   Independent   Functional Mobility Observation/Comments:   Independent     ACTIVITIES OF DAILY LIVING (ADL) ASSESSMENT    Upper Body Dressing Assessment/Comments:   Independent   Lower Body Dressing Assessment/Comments:   Independent   Grooming Assessment/Comments:   Independent       Patient Status after OT session: Patient returned to bed in position of comfort with call light in reach.      ASSESSMENT / RECOMMENDATIONS     Occupational Therapy Assessment:  The patient is a 38yr old male who admitted to [facility] for C5-7 ACDF.   Pt currently presents at independent level with good pain tolerance and good awareness of c-spine precautions.  Patient has no further acute OT needs at this time. DISCHARGE from ACUTE OT services.  Other Consults Requested: N/A      Patient / Caregiver Education Today:   Occupational Therapy Plan of Care/Consent & Recommendations (listed above)  Educated about the benefits of OT and plans to minimize  risk.  Discussed impairments & functional limitations & OT prognosis related to recovery from diagnosis.  Reviewed precautions (fall risk, c-spine).      Method of Teaching: Verbal and Demonstration    Learner: Patient    Response: Able to give return demonstration      GOALS / TREATMENT PLAN / FUNCTIONAL PROGNOSIS  Patient's Goals: go home    Occupational Therapy Goals:    NA    Prognosis:   Patient has no further acute OT needs at this time. Patient discharged from OT (see recommendations above).  Barriers that may interfere with OT POC: None    Treatment Plan:    Patient Education  Caregiver Education / Training    Recommended Frequency of Treatment: Evaluation/1-time treatment only.    Recommended Duration of Treatment: One visit (eval & 1X treat only).    Interim Report Due:  Not Applicable--One time eval/treat only.       The components of this evaluation necessitated a Low complexity level of clinical decision making.     Reported by:  Lewis Red OTR/L  Occupational Therapist, III  Physical Medicine and Rehabilitation   PI: [phone]  Vocera           [1]   Past Medical History:  Diagnosis Date    Anxiety     Diverticulitis    [2]   Past Surgical History:  Procedure Laterality Date    EXPLORATORY LAPAROTOMY  2015    PR CHOLECYSTECTOMY  2011    TONSILLECTOMY  1991   [3]   Social History  Tobacco Use    Smoking status: Former     Types: Cigars    Smokeless tobacco: Never    Tobacco comments:     Hx social smoking (cigars) - none since ~ 2015   Substance Use Topics    Alcohol use: Not Currently    Drug use: Yes     Types: Marijuana     Comment: 4 times a week

## 2023-11-14 NOTE — Discharge Summary (Signed)
 DEPARTMENT OF NEUROLOGICAL SURGERY  HOSPITAL DISCHARGE SUMMARY    Note Started: 11/14/2023, 10:47  Admission Date: 11/13/2023  1:34 PM  Discharge Date: 11/14/2023   Admission Diagnosis: Cervical myelopathy (HCC) [G95.9]  Discharge Diagnosis: same    PAST MEDICAL HISTORY:  Past Medical History[1]    OPERATIONS / PROCEDURES:  Anterior interbody fusion, with discectomy and decompression, C5-7  Insertion of interbody biomechanical device.C5-7  Anterior instrumentation, C5-7  Use of morselized allograft  Use of morselized autograft  Use of intraoperative neuromonitoring  Use of intraoperative fluoroscopy <60 minutes without radiologist, [id number]  Use of intraoperative microscope, [phone]  COMPLICATIONS/ INFECTIONS :     None    HISTORY OF PRESENT ILLNESS:  Samuel Cobb is a 54yr male has a diagnosis of Cervical myelopathy (HCC) [G95.9] with specific symptoms of ataxia and loss of hand dexterity. Imaging revealed severe stenosis at C5-7. Given the myelopathy, we decided to proceed with surgery.     The risks and benefits of surgical treatment were explained and he agreed to move forward with the above mentioned procedure.  Please refer to Pre-op history and physical for more information.    HOSPITAL COURSE:   Samuel Cobb underwent the above procedure as described by Dr. Marlow Sin in his dictation.  Post operatively, the patient was monitored on the floor overnight.  He worked with therapy and is cleared to be discharged to home today. Postoperative film shows adequate placement of hardware.  Patient is able to tolerate a diet.  Pain is well-managed with oral analgesia.  At the current time, he is ambulating, tolerating a diet and voiding well.        DISCHARGE EXAM:  GENERAL: awake, alert, in NAD  GCS: E4 M6 V5   PUPILS:PER  SPEECH: Clear  CN II-XII: grossly intact  MOTOR:   Deltoid  Biceps  Triceps  Hand Grip    Right  5 5 5 5    Left  5 5 5 5       Hip Flexion  Knee Extension  Knee Flexion  Dorsiflex  Great Toe Extension   Plantar Flexion    Right  5 5 5 5 5 5    Left  5 5 5 5 5 5          SENSORY: LT intact in all extremities  SURGICAL WOUND: Incision site is clean, dry and intact. Closed with absorbable material  Pulm: stable on RA  CV: HDS  ID: No active issues  GI:  tolerating regular diet.   GU: Voiding spontaneously    DISPOSITION:  Patient is stable for discharge to home    DISCHARGE FOLLOW UP:   Samuel Cobb  will follow up with us  in the Neurosurgery Clinic on 12/29/2023    [address] [place]  [address]  Telephone [phone].      MEDICATIONS PRESCRIBED for DISCHARGE:     Medication List        START taking these medications      Acetaminophen 500 mg Tablet  Commonly known as: TYLENOL  Take 2 tablets by mouth every 6 hours.     Naloxone  4 mg/actuation Nasal Spray  Commonly known as: NARCAN   Use only when an opioid overdose emergency is suspected.  Spray into one nostril upon   signs of opioid overdose. Call 911.   Additional doses may be given every 2 to 3 minutes until emergency medical assistance arrives.  Alternate nostrils with each dose.  Use each nasal spray only one time.  Oxycodone 10 mg Tablet  Take 1 tablet by mouth every 4 hours if needed (only for severe pain).     Polyethylene Glycol 3350 17 gram/dose Powder  Commonly known as: MIRALAX  Take 17 g by mouth every morning. Mix in 4 to 8 oz of water, soda, coffee, juice or tea.  Start taking on: Nov 15, 2023     Sennosides 8.6 mg Tablet  Commonly known as: SENOKOT  Take 2 tablets by mouth every day at bedtime.            CHANGE how you take these medications      Prasugrel 10 mg Tablet  Commonly known as: EFFIENT  Take 1 tablet by mouth every morning.  Start taking on: Nov 20, 2023  What changed: These instructions start on Nov 20, 2023. If you are unsure what to do until then, ask your doctor or other care provider.            CONTINUE taking these medications      Aspirin 81 mg Chewable Tablet  Take 1 tablet by mouth every day.      Atorvastatin 80 mg tablet  Commonly known as: LIPITOR  Take 1 tablet by mouth every day.     Buprenorphine 2 mg Sublingual Tablet  Commonly known as: SUBUTEX  Dissolve 2 tablets under the tongue 4 times daily.     Desipramine  10 mg Tablet  Commonly known as: NORPRAMIN   Take 1 tablet by mouth every day at bedtime.     Fluconazole 150 mg Tablet  Commonly known as: DIFLUCAN  Take 1 tablet by mouth every morning.     JARDIANCE 10 mg Tablet  Generic drug: Empagliflozin  Take 1 tablet by mouth every day.     LINZESS 145 mcg Capsule  Generic drug: LinaCLOtide  Take 1 capsule by mouth every morning.     LOPRESSOR 50 mg Tablet  Generic drug: Metoprolol Tartrate  Take 1 tablet by mouth 2 times daily.     Losartan 25 mg Tablet  Commonly known as: COZAAR  Take 0.5 tablets by mouth every morning.     Nitroglycerin 0.4 mg Sublingual Tablet  Commonly known as: NITROSTAT  Dissolve 1 tablet under the tongue.     Promethazine 25 mg Tablet  Commonly known as: PHENERGAN  Take 1 tablet by mouth once daily if needed.            STOP taking these medications      Chlorhexidine  4 % Topical Liquid  Commonly known as: HIBICLENS      Diazepam 2 mg Tablet  Commonly known as: VALIUM     Pregabalin  25 mg Capsule  Commonly known as: LYRICA                Where to Get Your Medications        These medications were sent to [facility]  [address], [place] [address]      Hours: Weekdays: 8:00 AM to 7:00 PM. Weekends & University Holidays: 9:00 AM to 6:00 PM. Phone: [phone]   Acetaminophen 500 mg Tablet  Naloxone  4 mg/actuation Nasal Spray  Oxycodone 10 mg Tablet  Polyethylene Glycol 3350 17 gram/dose Powder  Sennosides 8.6 mg Tablet           DISCHARGE INSTRUCTIONS:  1. Do not drive while taking narcotic pain medication.   2. Check your incision daily for redness, tenderness or drainage.   3. He has been instructed to contact our  office should she have any fevers greater than 101, new weakness, new numbness, increased  headaches associated with n/v not relieved with pain medication, difficulty with ambulation or sudden change in bowel or bladder function, or opening or drainage from the incisions         The patient was involved in the decision making process and agreed with the plan of care. Questions were sought and answered. She received a copy of all discharge instructions and education material.        A total of about 60 minutes was spent coordinating patient discharge.     Electronically signed:      Hiram Lukes, PA-C  Physician Associate   Department of Neurological Surgery    , [facility]              [1]   Past Medical History:  Diagnosis Date    Anxiety     Diverticulitis

## 2023-11-14 NOTE — Discharge Instructions (Signed)
[facility]  DEPARTMENT OF NEUROLOGICAL SURGERY    POST-OP SPINE CARE    DISCHARGE INSTRUCTIONS      Incision:  If you have a dressing over the incision, please remove 3 days after surgery.   OK to shower 3 days after surgery. Wash surgical area gently with baby soap and pat it dry.  No submerging of wound (bath, swimming, hot tub) until by your surgeon's office.   Check incision daily and monitor for signs of infection such as new redness, opening of incision, or drainage.   No pets in bed while incision is healing  Your incision was closed with absorbable material.       Brace Instructions:  Neck brace for comfort only.     If you have any questions about your brace, including how to put it on, take it off, how to take care of your brace or the proper fitting of the brace - please reach out to Kinnie Scales, [facility], our [facility] Outpatient Senior Orthotist-Prosthetist. Fleet Contras returns calls within 24 hours Monday - Friday. Her direct line is [phone].      Activity:  Avoid strenuous work, driving, and contact sports until reevaluated in clinic.  No repetitive motion involving the surgical spine region until reevaluated in clinic.  No lifting more than half-gallon of milk (5 lbs) until reevaluated in clinic  Walking is strongly encouraged.   Do not smoke as this can impair healing     Pain:  Please take pain medicine as prescribed.  Gently activity and ice can help manage pain   Pain will slowly improve and the need for pain medicine should decrease over time  Do not take Aspirin, Ibuprofen, or other NSAIDs until cleared by your neurosurgeon as these medications can increase risk of bleeding and affect bone healing.   Do not drive while taking pain medicine   Taking opioids (pain medicine) will make you constipated. This problem will not go away as long as you are taking opioids. Here are some things you can do to decrease constipation that is caused by taking opioids:  -Take laxatives and/or stool  softeners (available over the counter)  -Drink plenty of water  -Stay physically active     Follow up:  You will follow-up in Spine clinic with your spine surgeon in 4-6 weeks. Call [phone] to schedule if you do not have an appointment.       When to return:  Call 911: sudden cough, shortness of breath, difficulty breathing, chest pain and/or severe lightheadedness    Call Spine Clinic [phone]) if:   General: Fever, night sweats, nausea/vomiting  Wound: redness, swelling, or drainage  Neurologic: increasing pain; new weakness in arms or legs; new numbness or tingling; new loss of bowel or bladder control  Possible Blood Clot: Swelling, pain, warmth and/or discoloration in one or both legs

## 2023-11-14 NOTE — Consults (Signed)
 COMPREHENSIVE PAIN MANAGEMENT CONSULT    REASON FOR CONSULT:   "C5-7 ACDF on chronic subutex"    RECOMMENDATIONS:  This consult outlines Comprehensive Pain Management service recommendations to the primary team. The primary team is encouraged to engage in shared decision making to reconcile the treatment suggestions with the patient's clinical presentation.    Acute on chronic pain possibly secondary to recent C5-7 ACDF in complex patient with history of chronic pain. Patient is opioid tolerant. Possible inflammatory, neuropathic, and musculoskeletal pain. Based on analgesic efficacy, no history of aberrancy, tolerability of analgesics, and improvement of activity, while balancing comorbidities and low risk for opioid induced oversedation, it is appropriate to continue opioid therapy along with optimize non-opioid adjuvants.     Opioid considerations:  Pt stable on home Bup with acute post-op pain managed by current regimen. Consider scheduling non-opioid medications to limit PRN Oxycodone use.   Buprenorphine (SUBUTEX) Sublingual Tablet 4 mg, SUBLINGUAL, Q6H Now   Oxycodone (ROXICODONE) Tablet 5 mg, ORAL, Q4H PRN  Oxycodone (ROXICODONE) Tablet 10 mg, ORAL, Q4H PRN       Oxycodone (ROXICODONE) Tablet 5 mg, ORAL, RESCUE Q4H PRN  Discontinue Morphine Injection 2 mg, IV, RESCUE Q2H PRN    Acetaminophen (TYLENOL) Tablet 1,000 mg, ORAL, Q6H         Consider scheduling Cyclobenzaprine (FLEXERIL) Tablet 5 mg, ORAL, BID  Diazepam (VALIUM) Tablet 5 mg, ORAL, Q12H PRN  Consider scheduling Gabapentin (NEURONTIN) Capsule 300 mg, ORAL, QHS  Lidocaine  (LIDODERM ) 5 %(700 mg/patch) Patch 3 patch, Transdermal, Q24H Now      Bowel care: per primary / hold for loose stools  Hold all sedating meds for RR less than 10 or RASS score between -2 and -5  Consider CARE Project consultation for utilization of Reiki, Art therapy, Music therapy, Animal therapy.  Consider Chaplain or Social work consult.    Please utilize "pain management with  rescue-adult" orderset for prn opioid, multimodal, supportive care (bowel care, antipyretic, antiemetic) and safety orders, as appropriate.     Thank you for this interesting consult! Please secure chat or tiger text with any questions.      Comprehensive Pain Management  Team Contact:  Refer to [facility] OnCall Schedule, if no APP coverage contact your service pharmacist for assistance   1st Call: Secure Chat Comprehensive Pain Management (7AM-5PM) or CPM Service Pager [phone] (7AM-5PM)   **Outpatient providers, please feel free to contact the APP Team with questions/concerns related to their pain regimen     HISTORY OF PRESENT ILLNESS:    Samuel Cobb is [name] with PMH of anxiety, CAD/MI (s/p cardiac stent on ASA and prasugrel) diverticulitis, and cervical myelopathy who presents for C5-7 ACDF. [facility]. Has had neck pain which was managed by Norco until he was transitioned to Bup. Reports he has been stable on 4 mg QID dosing though had plans to titrate up prior to surgery. CPM consulted for post-op pain management.     SUBJECTIVE/ROS:  Pain  Location: neck     Timing: constant     Quality: ache, throbbing     Intensity:   At rest: 5/10  With movement: 7/10  Prior to admission: 5/10    Provoking: movement     Alleviating: Bup, Oxycodone     Functional:  Pain Impact on Appetite: 50% of CLEAR LIQUID DIET  ADVANCE AS TOLERATED DIET    Pain Impact on Sleep: limited by pain     Pain  Impact on Physical Therapy / Mobility: ambulating independently     Pain Impact on Social Interactions: lives with parents     Incentive Spirometry: Level Incentive Spirometer (mL): (not recorded)    Last bowel movement: Last Bowel Movement: 11/12/23 (11/13/23 1406)    Mood: appropriate     Adverse/Medication Related Events:  Patient reports the following symptoms: none   COWS: N/Cobb    New symptoms reported in the past 24 hours: none     PRIOR TO ADMISSION ANALGESICS:  Per Patient:   -  HYDROMORPHONE 2 MG TABLET - not taking   - BUPRENORPHINE 8 MG TABLET SL - :0.5 Tablet(s) Sublingual 4 Times Daily   -    Denies all other analgesics prior to admission including additional anxiolytics & antidepressants    Treatment History:  ( - / + / ++) = no improvement / mild / significant  Bold = current use  []  Topical agents  []  Anti-inflammatory analgesics  [x]  Acetaminophen   [x]  Gabapentinoids  []  Antidepressants (eg. SNRIs/TCAs)  []  Muscle relaxants  [x]  Opioids  []  Ketamine    PMH:  Past Medical History:  No date: Anxiety  No date: Diverticulitis    ALLERGIES:     Nuts [Peanut]    Other-Reaction in Comments    Comment:Contraindicated d/t Chron's dz  Quinine Hydrobromide    Nausea Only and Other-Reaction in                            Comments    Comment:Pt is unsure if this allergy was selected by             accident in the past.  Reglan [Metoclopramide]    Other-Reaction in Comments    Comment:Muscle spasms  Toradol [Ketorolac Tromethamine]    Other-Reaction in Comments    Comment:Muscle spasms  Tramadol    Other-Reaction in Comments    Comment:Muscle spasms  Wheat Starch    Other-Reaction in Comments    Comment:Contraindicated d/t Chron's dz  Zoloft [Sertraline Hcl]    Palpitations    OBJECTIVE:   Gen: Patient appears: comfortable, awake alert and answers questions appropriately  HEENT: c-collar in place with hemovac  CV: Normal  Resp: Unlabored breathing  Abd: Soft, nontender, nondistended  MSK: No edema, warm  Neuro: No gross motor or sensory deficit    LAB VALUES:   CHEM 20 No results for input(s): "NA", "K", "CL", "CO2", "CR", "BUN", "GLU", "CA", "TP", "ALB", "ALP", "AST", "TBIL", "ALT", "TRIG", "URIC", "LD", "PO4", "CHOL", "MG" in the last 72 hours.    CBC   Recent labs for the past 72 hours     11/14/23  0538   WBC 15.1*   HGB 14.9   HCT 43.6   PLT 266       INR No results for input(s): "INR" in the last 72 hours.      LAST TOX SCREEN  .[id number][BARBSSCRNU:1,BENZOSSCRNU:1,COCAINESCRNU:1,OPIATESSCRU:1,AMPHETSCCRNU:1,DRUGGCSCRNU:1     INPATIENT ANALGESICS:     Current Analgesic Regimen:          Received last 24 hours (13-13)   Acetaminophen (TYLENOL) Tablet 1,000 mg, ORAL, Q6H      2000 mg  Buprenorphine (SUBUTEX) Sublingual Tablet 4 mg, SUBLINGUAL, Q6H Now   12 mg  Cyclobenzaprine (FLEXERIL) Tablet 10 mg, ORAL, Q8H PRN  Diazepam (VALIUM) Tablet 5 mg, ORAL, Q12H PRN  Gabapentin (NEURONTIN) Capsule 300 mg, ORAL, Q8H PRN  HYDROmorphone (DILAUDID) Injection 0.5 mg, IV,  PRN  Lidocaine  (LIDODERM ) 5 %(700 mg/patch) Patch 3 patch, Transdermal, Q24H Now  3 patch  Morphine Injection 2 mg, IV, RESCUE Q2H PRN  Oxycodone (ROXICODONE) Tablet 5 mg, ORAL, Q4H PRN  Oxycodone (ROXICODONE) Tablet 10 mg, ORAL, Q4H PRN     30 mg  Oxycodone (ROXICODONE) Tablet 5 mg, ORAL, RESCUE Q4H PRN    Discontinued Analgesic Regimen:   5/9 - Oxycodone 5 mg PO X1          5 mg    24-hour OME mg trend:   Home: 200 (from Buprenorphine)  11/14/2023 (13-13): 52.5      Body mass index is 26.17 kg/m.      Risk Stratification  Oversedation Risk Criteria [facility]) score  Risk Factor Score   Age (years)   [x]  <50 [0 pts] []  50-59 [1 point] []  60+ [2 points] 0   BMI kg/m2 (round to the nearest number, ie. BMI: 35 mg/m2, check 40)   []  10 [5 pts] []  20 [2 pts] [x]  30 [0 pts] []  40 [1 pt] []  50 [2 pts] 0   []  60 [3 pts] []  70 [4 pts] []  80 [5 pts] []  90 [6 pts] []  100 [7 pts]    []  110 [8 pts] []  120 [9 pts] []  130 [10 pts]    Additional risk factors:   Male Sex: [2 points] 0   Concurrent sedating medicationsa: [5 points] 5   Renal insufficiency: [1 point] 0   Liver insufficiency: [2 points] 0   COPD: [1 point] 0   Sleep Apnea: [2 points] 0   Surgery within 24 hours: [2 points] 2   Not opioid nave: [1 point] 0   PCA basal: [3 points] 0   Total Score: 7   Low Risk (< 10 points)  Moderate Risk (10-20 points) High Risk (>= 21 points)   Samuel Cobb, Samuel Cobb, Samuel Cobb, et al. Predicting  opioid-induced oversedation in hospitalised patients: Cobb multicentre observational study. BMJ Open [phone].  Cobb Sedating-medications include: benzodiazepines, antidepressants, miscellaneous anxiolytic, sedative, and hypnotic (sleep aids), antipsychotics, antihistamines, anticonvulsants, dopaminergic anti-parkinsonism agents, barbiturates, phenothiazine antiemetics, carbonic anhydrase inhibitor      ASSESSMENT:   Chronic neck pain  Opioid dependence      Analgesia: the patient does receive analgesic benefit from both opioid and non-opioid therapy    Aberrancy: history and CURES reviewed, and no history of aberrancy noted         Adverse effects: patient appears to tolerate current analgesic regimen without any serious ADRs  Activity: current analgesic regimen helps patient achieve their functional goals        Total time I spent in care of this patient today (excluding time spent on other billable services) was 90 minutes. This time does not overlap with other providers involved in this patient's care.       Samuel Earl NP-C  Comprehensive Pain Management Service  Available on Secure [place], [place] Text & Pager [phone]  Service Hours: Monday - Sunday, 7am- 5pm  After Hours: Please reach out to Primary Team/1st contact  No 24 hour coverage

## 2023-11-14 NOTE — Anesthesia Postprocedure Evaluation (Signed)
 Patient: Leiden Purple    DISCECTOMY, SPINE, CERVICAL, 2 LEVELS, ANTERIOR APPROACH, WITH FUSION  MONITORING, NERVE    Anesthesia Type: general    Stop Bang: 4    Vital Signs (Last Recorded):  BP: 106/70 (11/14/23 0000)  Pulse: 83 (11/14/23 0000)  Resp: 13 (11/14/23 0000)  Temp Max: 37.6 C (99.7 F)  (Last 24 hours)  Temp: 36.2 C (97.2 F) (11/13/23 2300)  SpO2: 99 % (11/14/23 0000) on    Device (Oxygen Therapy): room air (11/13/23 2300)          Anesthesia Post Evaluation    Procedure: Procedure(s):  DISCECTOMY, SPINE, CERVICAL, 2 LEVELS, ANTERIOR APPROACH, WITH FUSION  MONITORING, NERVE  [facility]  Anesthesia: General    Patient location during evaluation: PACU  Patient participation: complete - patient participated  Level of consciousness: awake and alert  Pain score: 5  Pain management: adequate      Patient did not have a perioperative block    Airway patency: patent    Cardiovascular status: stable  Respiratory status: room air and spontaneous ventilation  Anesthetic complications: no  Nausea absent in Recovery      Comments: Dispo: Pt is a 11yr year old male status post DISCECTOMY, SPINE, CERVICAL, 2 LEVELS, ANTERIOR APPROACH, WITH FUSION  MONITORING, NERVE.  Patient seen and evaluated in PACU. Received Fent 400mcg, Dilaudid 4.3mg , Oxy 5mg , Valium 5mg , Ketamine 20mg , and Precedex 70mcg in PACU. Pain well controlled with multimodal analgesia used as indicated. VSS. Patient denies ongoing nausea and vomiting. Patient stable and appropriate for discharge from PACU to Med/Surg. Continued care per primary team.      Lindell Revel, MD  Anesthesiology PGY-2  Pager: [phone]  PI: [id number]    11/15/2023  09:55  This patient was seen, evaluated, and care plan was developed with the resident.  I agree with the assessment and plan as outlined in the resident's note.  Report electronically signed by L. Moser MD  [id number]  Attending  Pager [phone]              Lindell Revel, MD

## 2023-11-14 NOTE — Nurse Focus (Signed)
 Skin checked with [place] RN. No pressure sore noted. Has anterior neck dressing. Evaristo Hind, RN

## 2023-11-15 ENCOUNTER — Other Ambulatory Visit: Payer: Self-pay

## 2023-11-16 ENCOUNTER — Other Ambulatory Visit: Payer: Self-pay

## 2023-11-17 ENCOUNTER — Encounter: Payer: Self-pay | Admitting: SURGERY/NEUROLOGIC

## 2023-11-18 ENCOUNTER — Other Ambulatory Visit: Payer: Self-pay

## 2023-11-22 ENCOUNTER — Other Ambulatory Visit: Payer: Self-pay

## 2023-11-25 ENCOUNTER — Other Ambulatory Visit: Payer: Self-pay

## 2023-12-29 ENCOUNTER — Encounter: Payer: Self-pay | Admitting: Physician Assistant

## 2023-12-29 ENCOUNTER — Ambulatory Visit
Admission: RE | Admit: 2023-12-29 | Discharge: 2023-12-29 | Disposition: A | Discharge status: Home / Self Care | Payer: MEDICAID | Source: Ambulatory Visit | Attending: Diagnostic Radiology | Admitting: Diagnostic Radiology

## 2023-12-29 ENCOUNTER — Ambulatory Visit: Payer: MEDICAID | Admitting: Physician Assistant

## 2023-12-29 ENCOUNTER — Other Ambulatory Visit: Payer: Self-pay | Admitting: Physician Assistant

## 2023-12-29 VITALS — BP 102/66 | HR 58 | Temp 97.3°F

## 2023-12-29 DIAGNOSIS — M542 Cervicalgia: Secondary | ICD-10-CM

## 2023-12-29 DIAGNOSIS — Z981 Arthrodesis status: Secondary | ICD-10-CM

## 2023-12-29 DIAGNOSIS — Z4789 Encounter for other orthopedic aftercare: Secondary | ICD-10-CM

## 2023-12-29 DIAGNOSIS — Z9889 Other specified postprocedural states: Secondary | ICD-10-CM

## 2023-12-29 NOTE — Nursing Note (Signed)
 Identified patient using name and date of birth.       Vitals were taken.   Screened for pain.      Inetta Fermo, MA II

## 2023-12-29 NOTE — Patient Instructions (Signed)
 Strengthening: Hands on head    Move your head backward, forward, and side to side against gentle pressure from your hands, holding each position for about 6 seconds.  Repeat 8 to 12 times.

## 2023-12-29 NOTE — Progress Notes (Signed)
 NEUROLOGICAL SPINE- SURGERY   [address], [address]  [place], [place]  [address]    SPINE CLINIC Follow-up Note    Encounter date:  12/29/2023     Chief Complaint   Patient presents with    Neck Pain       Post op  Procedure:   Anterior cervical disectomy and fusion C 5-7   Date: 11/13/2023  Surgeon: Charlie Domino MD      HISTORY OF PRESENT ILLNESS:   Samuel Cobb is a 38yr old male ~ 6 weeks post above procedure   Prior to surgery symptoms:  neck pain, bilateral arm pain, dropping things from hands     Current symptoms:  Rt side neck spasms down to Rt scapula   Denies any fevers, chills, or wound drainage.  Denies any Bowel or Bladder dysfunction.  Denies difficulty swallowing    current Pain level: Cervical spine 6/10   Assistive devices:none   Pain medications: Currently  taking OTC with some pain control.  Function:  able to do ADL's.    Brace: Wearing N/A   when out of bed.  Walking on a daily basis.    REVIEW OF SYSTEMS:  A complete review of systems is performed and is positive for: see above   The remainder of the review of systems are otherwise negative except as above.    CURRENT MEDICATIONS:   Medications Taking[1]   Allergies[2]    PHYSICAL EXAMINATION:     BP 102/66 (SITE: left arm, Orthostatic Position: sitting, Cuff Size: regular)   Pulse 58   Temp 36.3 C (97.3 F) (Temporal)   General: Pleasant, alert, well developed, NAD,oriented x 3.    Musculoskeletal/Neurological:   CN II-XII grossly intact.  Range of Motion: Cervical Spine limited range of motion in all planes, as expected due to recent procedure.      Wound:  no erythema, drainage or dehiscence. Healing well.     MOTOR:     Deltoid  Biceps  Triceps  Wrist Flexion  Wrist Extension  Hand Intrinsics    Right  5/5 5/5 5/5 5/5 5/5 5/5   Left  5/5 5/5 5/5 5/5 5/5 5/5      Hip Flexion  Knee Extension  Knee Flexion  Dorsiflexion  Great Toe Extension  Plantar Flexion    Right  5/5 5/5 5/5 5/5 5/5 5/5   Left  5/5 5/5 5/5 5/5 5/5 5/5            IMAGING:     New Cervical Spine x rays 2 views taken today 12/29/2023     Cervical spine miss alignment improved Kyphosis  No evidence of hardware failure C C 5- 7           IMPRESSION AND PLAN:       Samuel Cobb is a 38yr old male ~ 6 weeks post C 5- 7 Anterior cervical disectomy and fusion for neck pain, bilateral radiculopathy improved. Posterior neck muscle spasms     Imaging findings:    Stabe harware C 5-7     RECOMMENDATIONS:   Avoid repetitive motion x 2 weeks   Ok to start driving in 2 weeks   Ok to return to work on: 01/13/2024 letter provided    Discussion :   The above was discussed with the patient at today's visit.  The diagnoses, workup, treatment options, risks, and benefits of each were explained in detail, and the patient expressed sound understanding.  Per that discussion, the following consensus plan was reached:  Diagnostic imaging:  new  X rays ordered today, to be taken next appointment.  Referral: No new referrals  Follow Up:   No follow-ups on file.    Orders Placed This Encounter    C-SPINE 4+ VIEWS       A total of 30 minutes were spent with patient. Greater than 50% of this time was spent face to face in the consultation phase discussing issues as stated above and in EMR note.     Bobette Graham PA-C  Neurological Surgery Department.  12/29/2023          [1]   No outpatient medications have been marked as taking for the 12/29/23 encounter (Office Visit) with Graham Tonny Sheerer, PA.   [2]   Allergies  Allergen Reactions    Nuts [Peanut] Other-Reaction in Comments     Contraindicated d/t Chron's dz    Quinine Nausea Only and Other-Reaction in Comments     shake  shake    Quinine Hydrobromide Nausea Only and Other-Reaction in Comments     Pt is unsure if this allergy was selected by accident in the past.    Reglan [Metoclopramide] Other-Reaction in Comments     Muscle spasms    Toradol [Ketorolac Tromethamine] Other-Reaction in Comments     Muscle spasms    Tramadol Other-Reaction in  Comments     Muscle spasms    Wheat Starch Other-Reaction in Comments     Contraindicated d/t Chron's dz    Zoloft [Sertraline Hcl] Palpitations

## 2024-01-20 ENCOUNTER — Encounter: Payer: Self-pay | Admitting: SURGERY/NEUROLOGIC

## 2024-02-18 ENCOUNTER — Ambulatory Visit: Payer: MEDICAID | Admitting: SURGERY/NEUROLOGIC

## 2024-02-18 ENCOUNTER — Ambulatory Visit
Admission: RE | Admit: 2024-02-18 | Discharge: 2024-02-18 | Disposition: A | Discharge status: Home / Self Care | Payer: MEDICAID | Source: Ambulatory Visit | Attending: Diagnostic Radiology | Admitting: Diagnostic Radiology

## 2024-02-18 ENCOUNTER — Encounter: Payer: Self-pay | Admitting: SURGERY/NEUROLOGIC

## 2024-02-18 VITALS — BP 96/61 | HR 74 | Temp 97.5°F | Resp 18 | Ht 67.0 in | Wt 174.6 lb

## 2024-02-18 DIAGNOSIS — M2569 Stiffness of other specified joint, not elsewhere classified: Secondary | ICD-10-CM

## 2024-02-18 DIAGNOSIS — Z981 Arthrodesis status: Secondary | ICD-10-CM

## 2024-02-18 DIAGNOSIS — Z9889 Other specified postprocedural states: Secondary | ICD-10-CM

## 2024-02-18 DIAGNOSIS — M542 Cervicalgia: Secondary | ICD-10-CM | POA: Insufficient documentation

## 2024-02-18 DIAGNOSIS — Z4789 Encounter for other orthopedic aftercare: Secondary | ICD-10-CM

## 2024-02-18 NOTE — Progress Notes (Signed)
 I personally saw the patient and reviewed all relevant history and physical examination with the medical student.  I agree with the assessment and plan detailed in this note. I went over the imaging studies with the patient.  The recommendations are based on the review of the history, relevant images and examination.     All questions were answered and the patient is in agreement with the plan. Thank you for allowing me to be part of the healthcare team.    Richard L. Gretel, MD, PhD  Assistant Professor of Neurological Surgery  [facility]        NEUROLOGICAL SPINE- SURGERY   [address] [place] [address], [place]  [address]    SPINE CLINIC Follow-up Note    Encounter date:  12/29/2023     CC: 3 month follow-up for ACDF C5-7      Post op  Procedure: Anterior cervical disectomy and fusion C5-7   Date: 11/13/2023  Surgeon: Charlie Gretel MD      HISTORY OF PRESENT ILLNESS:   Samuel Cobb is a 38yr old male presenting for 3 month follow-up s/p ACDF C5-7 11/13/23. Reports that the spasms of his neck down to the R scapula have resolved. Continues to endorse stiffness. Denies difficulty swallowing. Currently rates his pain as 6-7/10, is on buprenorphine  which is managed by his PCP.     Prior to surgery symptoms: Neck pain, bilateral arm pain, dropping things from hands     Symptoms at 6 week follow-up:  Right side neck spasms down to right scapula       REVIEW OF SYSTEMS:  A complete review of systems is performed and is positive for: see above   The remainder of the review of systems are otherwise negative except as above.      PHYSICAL EXAMINATION:     There were no vitals taken for this visit.  General: Pleasant, alert, well developed, NAD,oriented x 3.    Musculoskeletal/Neurological:   CN II-XII grossly intact.  Range of Motion: Cervical Spine limited range of motion in all planes, as expected due to recent procedure.      Wound:  No erythema, drainage or dehiscence. Healing well.     MOTOR:     Deltoid  Biceps   Triceps  Wrist Flexion  Wrist Extension  Hand Intrinsics    Right  5/5 5/5 5/5 5/5 5/5 5/5   Left  5/5 5/5 5/5 5/5 5/5 5/5      Hip Flexion  Knee Extension  Knee Flexion  Dorsiflexion  Great Toe Extension  Plantar Flexion    Right  5/5 5/5 5/5 5/5 5/5 5/5   Left  5/5 5/5 5/5 5/5 5/5 5/5         IMAGING:     New Cervical Spine x rays 2 views taken today 02/18/2024     Cervical spine mass alignment  No evidence of hardware failure C5-7           IMPRESSION AND PLAN:       Samuel Cobb is a 38yr old male presenting for 3 month follow-up s/p ACDF C5-7 11/13/23. He continues to endorse some stiffness, though this is not unexpected postoperatively. His imaging today shows stable hardware at C5-7.       RECOMMENDATIONS:   - Follow up in 9 months with x-rays to be taken at next appointment  - Referral to PT placed for ROM  - Will defer pain control to PCP, who manages his  buprenorphine   - No contact sports but otherwise OK to engage in other sports and activities      The above was discussed with the patient at today's visit. The diagnoses, workup, treatment options, risks, and benefits of each were explained in detail, and the patient expressed sound understanding.      Per that discussion, the following consensus plan was reached:  Diagnostic imaging: New X rays ordered today, to be taken next appointment.  Referral: No new referrals  Follow Up:     No orders of the defined types were placed in this encounter.      Annabella Seip  Medical Student MS4  Neurological Surgery  02/18/2024 2:40 PM

## 2024-02-18 NOTE — Nursing Note (Signed)
 Vital signs taken, allergies verified, screened for pain, medication hx taken. Age appropriate, interactive and cooperative. No acute distress noted at this time.       Ellyce Lafevers MA II

## 2024-03-30 ENCOUNTER — Encounter: Payer: Self-pay | Admitting: SURGERY/NEUROLOGIC

## 2024-03-31 ENCOUNTER — Telehealth: Payer: Self-pay | Admitting: SURGERY/NEUROLOGIC

## 2024-03-31 NOTE — Telephone Encounter (Signed)
 FYI Only,    Patient called to get a f/up appt to discuss his Knot and symptoms for his neck post surgical pain.    I advised to reach out to his PCP ([facility]) for Cont of care and his PCP to provide us  an Auth before we can schedule a f/up appt. The current Auth expired 02/24/24 Referral# [id number].    Pt voiced understanding.

## 2024-04-13 ENCOUNTER — Encounter: Payer: Self-pay | Admitting: Neurological Surgery

## 2024-05-09 ENCOUNTER — Other Ambulatory Visit: Payer: Self-pay

## 2024-05-09 DIAGNOSIS — M542 Cervicalgia: Secondary | ICD-10-CM

## 2024-05-13 ENCOUNTER — Ambulatory Visit
Admission: RE | Admit: 2024-05-13 | Discharge: 2024-05-13 | Disposition: A | Discharge status: Home / Self Care | Payer: MEDICAID | Source: Ambulatory Visit | Attending: Diagnostic Radiology | Admitting: Diagnostic Radiology

## 2024-05-13 ENCOUNTER — Ambulatory Visit: Payer: MEDICAID

## 2024-05-13 VITALS — BP 104/76 | HR 75 | Temp 97.9°F | Resp 16 | Ht 68.0 in | Wt 182.8 lb

## 2024-05-13 DIAGNOSIS — Z981 Arthrodesis status: Secondary | ICD-10-CM

## 2024-05-13 DIAGNOSIS — M4802 Spinal stenosis, cervical region: Secondary | ICD-10-CM | POA: Insufficient documentation

## 2024-05-13 DIAGNOSIS — G959 Disease of spinal cord, unspecified: Secondary | ICD-10-CM

## 2024-05-13 DIAGNOSIS — M542 Cervicalgia: Secondary | ICD-10-CM | POA: Insufficient documentation

## 2024-05-13 DIAGNOSIS — G9589 Other specified diseases of spinal cord: Secondary | ICD-10-CM | POA: Insufficient documentation

## 2024-05-13 DIAGNOSIS — R202 Paresthesia of skin: Secondary | ICD-10-CM

## 2024-05-13 DIAGNOSIS — R269 Unspecified abnormalities of gait and mobility: Secondary | ICD-10-CM

## 2024-05-13 DIAGNOSIS — Z09 Encounter for follow-up examination after completed treatment for conditions other than malignant neoplasm: Secondary | ICD-10-CM | POA: Insufficient documentation

## 2024-05-13 NOTE — Nursing Note (Signed)
 Identified patient using name and date of birth.  Vital signs were taken.        Samuel Cobb [name]

## 2024-05-13 NOTE — Progress Notes (Signed)
 NEUROLOGICAL SPINE- SURGERY   [address], [address]  [place], [place]  [address]    SPINE CLINIC Follow-up Note    Encounter date:  05/13/2024    Post op  Procedure:   C5-7 ACDF  Date: 11/13/23  Surgeon: Charlie Domino MD    HISTORY OF PRESENT ILLNESS:   Lang Zingg is a 38yr old male 6 months s/p C5-7 ACDF for cervical myelopathy.    Current symptoms:    Shortly after his last visit with our clinic he began to develop an increase in neck pain with numbness in both upper extremities including the hands that occurs when he lays down. The numbness is down the back of the arm and fourth-fifth digits. Patient states these symptoms were present prior to surgery, resolved for a few months, then returned in Sept '25. Prior to their return he was sick from a Crohn's flare, spending a lot of time in bed. He denies specific injury. Additionally he reports his right foot is falling asleep when he walks, so this makes him off balance, and this is new. Additionally reports new onset urinary urgency. Denies incontinence.    No hand incoordination.     Current Pain level: Cervical spine 7/10   Assistive devices: none   Pain medications: Buprenorphine  without good pain control.    Activity: Walking on a daily basis.     Prior to surgery symptoms:    Neck pain, bilateral arm pain, dropping things from hands     REVIEW OF SYSTEMS:  Complete ROS was performed and is negative except for what is noted in the HPI.   The remainder of the review of systems are otherwise negative except as above.    CURRENT MEDICATIONS:   Medications Taking[1]   Allergies[2]    PHYSICAL EXAMINATION:  BP 104/76 (SITE: left arm, Orthostatic Position: sitting, Cuff Size: large)   Pulse 75   Temp 36.6 C (97.9 F) (Oral)   Resp 16   Ht 1.727 m (5' 8)   Wt 82.9 kg (182 lb 12.2 oz)   BMI 27.79 kg/m    General Appearance: alert, no distress, cooperative  Mental status: Alert and oriented.    Strength   Deltoid  C5 Biceps   C5 Triceps   C7 Wrist  Flexion  C7 Wrist Extension  C6 Hand Intrinsics  C8, T1   Right  5/5 5/5 5/5 5-/5 5-/5 5-/5   Left  5/5 5/5 5/5 5/5 5/5 5-/5     Wrist strength limited by pain     Hip Flexion  L2  Knee Extension  L3, L4  Knee Flexion  Dorsi  flexion   L5 Plantar Flexion  S1    Right  5/5 5-/5 5-/5 5-/5 5/5   Left  5/5 5/5 5/5 5/5 5/5     Reflexes   Biceps  C5 Brachio  Radialis  C6 Hoffman's Trmner   Right  3+ 3+ neg neg   Left  3+ 3+ neg neg      Patella   L4 Achilles   S1   Right  3+ 2+   Left  3+ 2+     Gait: Tandem gait is done without difficulty.      RADIOLOGY:   New Cervical Spine x rays 4 views  done today 05/13/2024   Stable arthrodesis without hardware complications and no interval change from immediate post op x-rays         IMPRESSION:   Trayton Szabo is a 38yr  old male 6 months s/p C5-7 ACDF for cervical myelopathy with Dr. Gretel on 11/13/23.    Patient initially did well post-operatively with improvement of pre-op symptoms, but experienced a regression about 2 months ago. Additionally has new symptoms including right foot numbness resulting in gait imbalance and urinary urgency. Unclear etiology. On exam he has non-dermatomal reduced power in the right upper and lower extremities, stable gait and continued hyperreflexia.     PLAN:    Recommendations:   Will obtain MRI full spine to rule out cord compression/stenosis that could be contributing to his diffuse paresthesias and weakness in the right lower extremity     Orders Placed This Encounter    MR C-SPINE WITHOUT CONTRAST    MR L-SPINE WITHOUT CONTRAST    MR T-SPINE WITHOUT CONTRAST     I informed patient about red flag signs/symptoms and ED precautions, including but not limited to any new/worsening bowel or bladder dysfunction, saddle anesthesia, severe new or severely worsening neurologic deficits (paresthesias/numbness/weakness), or inability to walk.      Patient verbalized understanding of the above and is in agreement of the plan.  All questions and concerns  were addressed.      Farhaan Mabee was encourage to contact us  if any further questions or concerns should arise.    For clinic use:   Return for MRI Review, with Dr. Gretel or APP, ([employer] [employer]).     X-rays Needed: No      Total time I spent in care of this patient today (excluding time spent on other billable services) was 45 minutes.    Today's visit has complexity inherent to evaluation and management associated with medical care services that are part of ongoing care related to a patient's single, serious condition or a complex condition, which is:   Cervical myelopathy (HCC)  (primary encounter diagnosis)  Cervical stenosis of spinal canal  Gait abnormality  Paresthesia     APP: Lauraine Commander, PA-C  Neurological Surgery Department  [facility]           [1]   No outpatient medications have been marked as taking for the 05/13/24 encounter (Office Visit) with Brandonn Capelli, Lauraine Dragon, PA.   [2]   Allergies  Allergen Reactions    Nuts [Peanut] Other-Reaction in Comments     Contraindicated d/t Chron's dz    Quinine Nausea Only and Other-Reaction in Comments     shake  shake    Quinine Hydrobromide Nausea Only and Other-Reaction in Comments     Pt is unsure if this allergy was selected by accident in the past.    Reglan [Metoclopramide] Other-Reaction in Comments     Muscle spasms    Toradol [Ketorolac Tromethamine] Other-Reaction in Comments     Muscle spasms    Tramadol Other-Reaction in Comments     Muscle spasms    Wheat Starch Other-Reaction in Comments     Contraindicated d/t Chron's dz    Zoloft [Sertraline Hcl] Palpitations

## 2024-05-13 NOTE — Patient Instructions (Addendum)
 05/13/2024    Hello Ozell Jing,    It was a pleasure seeing you today.    To recap, we recommend:  1) MRI of the neck, mid back and low back    Please call Radiology to schedule: [phone]     Please call to schedule a visit with us  once the above items are scheduled and/or completed.     Thank you,    Lauraine Commander, PA-C  [facility]  Neurological Surgery    [address]  [place], [place] [address]  Phone: [phone]  Fax : [phone]

## 2024-08-25 ENCOUNTER — Ambulatory Visit: Payer: MEDICAID | Admitting: SURGERY/NEUROLOGIC

## 2024-11-17 ENCOUNTER — Ambulatory Visit: Payer: MEDICAID | Admitting: SURGERY/NEUROLOGIC
